# Patient Record
Sex: Female | Born: 1940 | Race: White | Hispanic: No | Marital: Married | Smoking: Current some day smoker
Health system: Southern US, Community
[De-identification: ages and names within clinical notes are randomized; demographics above are authoritative.]

## PROBLEM LIST (undated history)

## (undated) DIAGNOSIS — I499 Cardiac arrhythmia, unspecified: Secondary | ICD-10-CM

## (undated) DIAGNOSIS — C884 Extranodal marginal zone b-cell lymphoma of mucosa-associated lymphoid tissue (malt-lymphoma) not having achieved remission: Secondary | ICD-10-CM

## (undated) DIAGNOSIS — E785 Hyperlipidemia, unspecified: Secondary | ICD-10-CM

## (undated) DIAGNOSIS — C801 Malignant (primary) neoplasm, unspecified: Secondary | ICD-10-CM

## (undated) DIAGNOSIS — J329 Chronic sinusitis, unspecified: Secondary | ICD-10-CM

## (undated) DIAGNOSIS — K219 Gastro-esophageal reflux disease without esophagitis: Secondary | ICD-10-CM

## (undated) DIAGNOSIS — E119 Type 2 diabetes mellitus without complications: Secondary | ICD-10-CM

## (undated) DIAGNOSIS — R002 Palpitations: Secondary | ICD-10-CM

## (undated) DIAGNOSIS — R1310 Dysphagia, unspecified: Secondary | ICD-10-CM

## (undated) DIAGNOSIS — F329 Major depressive disorder, single episode, unspecified: Secondary | ICD-10-CM

## (undated) DIAGNOSIS — C3430 Malignant neoplasm of lower lobe, unspecified bronchus or lung: Secondary | ICD-10-CM

## (undated) DIAGNOSIS — N3010 Interstitial cystitis (chronic) without hematuria: Secondary | ICD-10-CM

## (undated) DIAGNOSIS — K449 Diaphragmatic hernia without obstruction or gangrene: Secondary | ICD-10-CM

## (undated) DIAGNOSIS — F32A Depression, unspecified: Secondary | ICD-10-CM

## (undated) HISTORY — DX: Depression, unspecified: F32.A

## (undated) HISTORY — PX: EYE SURGERY: SHX253

## (undated) HISTORY — DX: Interstitial cystitis (chronic) without hematuria: N30.10

## (undated) HISTORY — DX: Diaphragmatic hernia without obstruction or gangrene: K44.9

## (undated) HISTORY — PX: ABDOMINAL HYSTERECTOMY: SHX81

## (undated) HISTORY — PX: ESOPHAGOGASTRODUODENOSCOPY: SHX1529

## (undated) HISTORY — PX: COLONOSCOPY: SHX174

## (undated) HISTORY — DX: Palpitations: R00.2

## (undated) HISTORY — DX: Extranodal marginal zone b-cell lymphoma of mucosa-associated lymphoid tissue (malt-lymphoma) not having achieved remission: C88.40

## (undated) HISTORY — PX: BLADDER SURGERY: SHX569

## (undated) HISTORY — PX: CATARACT EXTRACTION, BILATERAL: SHX1313

## (undated) HISTORY — DX: Dysphagia, unspecified: R13.10

## (undated) HISTORY — DX: Extranodal marginal zone B-cell lymphoma of mucosa-associated lymphoid tissue (MALT-lymphoma): C88.4

## (undated) HISTORY — DX: Hyperlipidemia, unspecified: E78.5

## (undated) HISTORY — DX: Malignant neoplasm of lower lobe, unspecified bronchus or lung: C34.30

## (undated) HISTORY — DX: Major depressive disorder, single episode, unspecified: F32.9

## (undated) HISTORY — DX: Type 2 diabetes mellitus without complications: E11.9

---

## 2000-01-20 ENCOUNTER — Encounter: Payer: Self-pay | Admitting: Urology

## 2000-01-21 ENCOUNTER — Observation Stay (HOSPITAL_COMMUNITY): Admission: RE | Admit: 2000-01-21 | Discharge: 2000-01-22 | Payer: Self-pay | Admitting: Urology

## 2000-07-28 ENCOUNTER — Ambulatory Visit (HOSPITAL_COMMUNITY): Admission: RE | Admit: 2000-07-28 | Discharge: 2000-07-28 | Payer: Self-pay | Admitting: Urology

## 2003-10-30 ENCOUNTER — Ambulatory Visit (HOSPITAL_COMMUNITY): Admission: RE | Admit: 2003-10-30 | Discharge: 2003-10-30 | Payer: Self-pay | Admitting: Internal Medicine

## 2010-09-02 DIAGNOSIS — R1310 Dysphagia, unspecified: Secondary | ICD-10-CM | POA: Insufficient documentation

## 2010-09-02 DIAGNOSIS — E7800 Pure hypercholesterolemia, unspecified: Secondary | ICD-10-CM | POA: Insufficient documentation

## 2010-09-02 DIAGNOSIS — F329 Major depressive disorder, single episode, unspecified: Secondary | ICD-10-CM

## 2010-09-02 DIAGNOSIS — R000 Tachycardia, unspecified: Secondary | ICD-10-CM | POA: Insufficient documentation

## 2010-09-08 ENCOUNTER — Ambulatory Visit: Payer: Self-pay | Admitting: Internal Medicine

## 2010-09-15 ENCOUNTER — Ambulatory Visit: Payer: Self-pay | Admitting: Internal Medicine

## 2010-09-15 ENCOUNTER — Ambulatory Visit (HOSPITAL_COMMUNITY): Admission: RE | Admit: 2010-09-15 | Discharge: 2010-09-15 | Payer: Self-pay | Admitting: Internal Medicine

## 2010-10-14 ENCOUNTER — Telehealth (INDEPENDENT_AMBULATORY_CARE_PROVIDER_SITE_OTHER): Payer: Self-pay

## 2010-12-01 NOTE — Letter (Signed)
Summary: EGD/ED ORDER  EGD/ED ORDER   Imported By: Ave Filter 09/08/2010 15:55:31  _____________________________________________________________________  External Attachment:    Type:   Image     Comment:   External Document

## 2010-12-03 NOTE — Progress Notes (Signed)
Summary: pt requests 90 day suppply of the Lansoprazole  Phone Note Call from Patient   Caller: Patient Summary of Call: Pt said she was given a prescription by Dr. Jena Gauss the day of her procedure for Lansoprazole. She said that her insurance will cover a 90 day supply if we can call it in to the [facility] in [place].  Initial call taken by: Cloria Spring LPN,  October 14, 2010 10:58 AM     Appended Document: pt requests 90 day suppply of the Lansoprazole ok; 90 day supply  Appended Document: pt requests 90 day suppply of the Lansoprazole Informed [facility] at [facility]. Pt aware also.

## 2010-12-03 NOTE — Assessment & Plan Note (Signed)
Summary: PT GETS CHOKED WHEN SHE EATS/LAW   Primary Care Provider:  T[facility]  Chief Complaint:  difficulty swallowing.  History of Present Illness: 70 y/o caucasian female w/ recurrent dysphagia w/ solids and occ liquids almost evey time she eats.  Wt loss 20# in 3 months but she feels somewhat intentional by dieting.  Denies odynophagia.  Off nexium x 4-61mo because she felt it wasn't helping too much.  Stays away from certain foods like onions & cucumbers.  Takes as needed TUMS.  Occ nausea & regurgitation.  Bms ok, once daily, without melena or rectal bleeding.    Current Problems (verified): 1)  Depression, Mild  (ICD-311) 2)  Hypercholesterolemia  (ICD-272.0) 3)  Tachycardia  (ICD-785.0) 4)  Dysphagia Unspecified  (ICD-787.20)  Current Medications (verified): 1)  Lopressor 50 Mg Tabs (Metoprolol Tartrate) 2)  Aspir-Low 81 Mg Tbec (Aspirin) .... Take 1 Tablet By Mouth Once A Day 3)  Multi-Vitamin .... One Tablet Daily 4)  Fish Oil 1000mg  .... One Tablet Daily 5)  Calcium 600mg  .... Take 1 Tablet By Mouth Two Times A Day 6)  Tums .... As Needed 7)  Metformin Hcl 500 Mg Xr24h-Tab (Metformin Hcl) .... One Tablet in The Am 8)  Citalopram Hydrobromide 20 Mg Tabs (Citalopram Hydrobromide) .... One Tablet Daily 9)  Simvastatin 20 Mg Tabs (Simvastatin) .... One Tablet At Bedtime 10)  Macrodantin 100 Mg Caps (Nitrofurantoin Macrocrystal) .... One Tablet Daily  Allergies (verified): 1)  ! Sulfa 2)  ! Sulfa  Past History:  Past Medical History: colonoscopy Dr Cleotis Nipper 2009->normal EGD w/ dilation for Schatski's ring 2004, 1997 hiatal hernia DM hyperlipidmia interstitial cystitis (?) Dr Annabell Howells depression heart palpitations  Past Surgical History: complete hysterectomy bladder surgery x 2  Family History: lost 1 son w/ metastatic CA ? source (65) 1 son deceased age 36 suicide No known family history of colorectal carcinoma, IBD, liver or chronic GI problems.  Social  History: married 2 living children, lost 1 son suicide, another met Ca retired [employer]  Patient is a former smoker. quit age 54, 50pkyr Alcohol Use - no Illicit Drug Use - no Patient does not get regular exercise.  Smoking Status:  quit Does Patient Exercise:  no Drug Use:  no  Review of Systems General:  Denies fever, chills, sweats, anorexia, fatigue, weakness, malaise, and sleep disorder. CV:  Denies chest pains, angina, palpitations, syncope, dyspnea on exertion, orthopnea, PND, peripheral edema, and claudication. GI:  Denies jaundice, gas/bloating, and fecal incontinence. GU:  Denies urinary burning, blood in urine, nocturnal urination, urinary frequency, urinary incontinence, and abnormal vaginal bleeding. MS:  Complains of joint pain / LOM; denies joint swelling, joint stiffness, joint deformity, low back pain, muscle weakness, muscle cramps, muscle atrophy, leg pain at night, leg pain with exertion, and shoulder pain / LOM hand / wrist pain (CTS); knees. Derm:  Denies rash, itching, dry skin, hives, moles, warts, and unhealing ulcers. Psych:  Denies depression, anxiety, memory loss, suicidal ideation, hallucinations, paranoia, phobia, and confusion. Heme:  Denies bruising, bleeding, and enlarged lymph nodes.  Vital Signs:  Patient profile:   70 year old female Height:      67 inches Weight:      164 pounds BMI:     25.78 Temp:     98.6 degrees F oral Pulse rate:   60 / minute BP sitting:   120 / 80  (left arm) Cuff size:   large  Vitals Entered By: Cloria Spring LPN (September 08, 2010  1:50 PM)  Physical Exam  General:  Well developed, well nourished, no acute distress. Head:  Normocephalic and atraumatic. Eyes:  Sclera clear, no icterus. Ears:  Normal auditory acuity. Nose:  No deformity, discharge,  or lesions. Mouth:  No deformity or lesions, dentition normal. Neck:  Supple; no masses or thyromegaly. Lungs:  Clear throughout to auscultation. Heart:  Regular rate and  rhythm; no murmurs, rubs,  or bruits. Abdomen:  Soft, nontender and nondistended. No masses, hepatosplenomegaly or hernias noted. Normal bowel sounds.without guarding and without rebound.   Msk:  Symmetrical with no gross deformities. Normal posture. Pulses:  Normal pulses noted. Extremities:  No clubbing, cyanosis, edema or deformities noted. Neurologic:  Alert and  oriented x4;  grossly normal neurologically. Skin:  Intact without significant lesions or rashes. Cervical Nodes:  No significant cervical adenopathy. Psych:  Alert and cooperative. Normal mood and affect.  Impression & Recommendations:  Problem # 1:  DYSPHAGIA UNSPECIFIED (ICD-29.20) 70 y/o caucasian female here for 1 yr hx worsening esophageal dysphagia with hx Schatzki rings.  I suspect recurrent ring(s).  EGD with esophageal dilatation to be performed by Dr. Jonathon Bellows in the near future.  I have discussed risks and benefits which include, but are not limited to, bleeding, infection, perforation, or medication reaction.  The patient agrees with this plan and consent will be obtained.  Orders: Consultation Level III (16109)  Patient Instructions: 1)  Begin prevacid 30mg  daily for acid reflux (2 weeks samples given)

## 2011-01-12 LAB — H. PYLORI ANTIBODY, IGG: H Pylori IgG: <0.4 {ISR}

## 2011-01-12 LAB — GLUCOSE, CAPILLARY: Glucose-Capillary: 124 mg/dL — ABNORMAL HIGH (ref 70–99)

## 2011-03-19 NOTE — Op Note (Signed)
NAME:  Monica [name], Monica [name]                          ACCOUNT NO.:  [id number]   MEDICAL RECORD NO.:  [id number]                   PATIENT TYPE:  AMB   LOCATION:  DAY                                  FACILITY:  [facility]   PHYSICIAN:  R. Roetta Sessions, M.D.              DATE OF BIRTH:  Jan 23, 1941   DATE OF PROCEDURE:  10/30/2003  DATE OF DISCHARGE:                                 OPERATIVE REPORT   PROCEDURE:  Esophagogastroduodenoscopy with [name] dilation.   ENDOSCOPIST:  Gerrit Friends. Rourk, M.D.   INDICATIONS FOR PROCEDURE:  The patient is a 70 year old Caucasian female  with recurrent esophageal dysphagia. I saw this lady some 6 years ago at  [facility].  She underwent EGD for dysphagia. She was found  to have 2 distal Schatzki rings (in tandem orientation).  She was dilated up  to a #58 [place] dilator.  She has done well until recently.  She was  on Prilosec, but came off of this medication.  She is really not having much  in the way of any reflux symptoms.  She started having insidious recurrent  esophageal dysphagia of the past 1 year.  EGD is now being done.  This  approach has been discussed with the patient at length.  The potential  risks, benefits, and alternatives have been reviewed.  The potential for  esophageal dilation was reviewed.  Her questions were answered; she is  agreeable.  Please see my handwritten H&P.   PROCEDURE NOTE:  O2 saturation, blood pressure, pulse and respirations were  monitored throughout the entire procedure.  Conscious sedation: Versed 5 mg  IV, Demerol 100mg  IV in divided doses.   INSTRUMENT:  Olympus video chip adult gastroscope.   FINDINGS:  Examination of the tubular esophagus again revealed 2 distal,  tandem Schatzki rings.  The esophageal mucosa otherwise appeared normal.  The EG junction was easily traversed with the scope.   STOMACH:  The gastric cavity was empty.  It insufflated well with air.  A  thorough examination  of the gastric mucosa including a retroflex view of the  proximal stomach and esophagogastric junction demonstrated only a moderate  size hiatal hernia; and both rings could be seen well retroflexed as well.  The pylorus was patent and easily traversed.   DUODENUM:  The bulb and the second portion appeared normal.   THERAPEUTIC/DIAGNOSTIC MANEUVERS:  A 56 French Maloney dilator was passed to  full insertion with ease.  Subsequently a 39 French Maloney dilator was also  passed with ease.  A look back revealed both rings had been ruptured without  apparent complications.   The patient tolerated the procedure well and was reacted in endoscopy.   IMPRESSION:  1. Two distal Schatzki rings, status post dilation as described above;     otherwise normal esophagus.  2. Moderate-to-large size hiatal hernia; otherwise normal stomach.  3. Normal D1 and D2.  DISCUSSION:  Hopefully today's procedure will give [name] relief of her  dysphagia for years to come.  I note that she has never had a colonoscopy.  I have talked to her about having a screening colonoscopy and have urged her  to call my office to schedule one in 2005.      ___________________________________________                                            Jonathon Bellows, M.D.   RMR/MEDQ  D:  10/30/2003  T:  10/30/2003  Job:  [id number]

## 2011-03-19 NOTE — Op Note (Signed)
[facility]  Patient:    Monica [name], Monica [name]                       MRN: [id number] Proc. Date: 07/28/00 Adm. Date:  [id number] Attending:  Evlyn Clines                           Operative Report  PROCEDURE:  Durasphere implant.  PREOPERATIVE DIAGNOSES:  Stress incontinence.  POSTOPERATIVE DIAGNOSES:  Stress incontinence.  SURGEON:  Dr. Bjorn Pippin.  ANESTHESIA:  Local and IV sedation.  COMPLICATIONS:  None.  INDICATIONS FOR PROCEDURE:  Ms. Robidoux is a 70 year old white female who is approximately a year out from a pubovaginal sling. She was dry for the first 4-5 months and then began to have progressive incontinence. Recent urodynamics revealed good urethral support but low leak point pressures consistent with type 3 incontinence it was felt durasphere injection would be a worthwhile option.  DESCRIPTION OF PROCEDURE:  The patient had been given p.o. antibiotics. She was taken to the operating room where she was placed in lithotomy position. She was given sedation as needed. Her genitalia was prepped with Betadine solution and she was draped in the usual sterile fashion. Her urethra was instilled with 10 cc of 2% lidocaine jelly. After an appropriate period of time, a 1 French continuous flow resectoscope sheath was inserted. The urethra was calibrated to a 28 [place] prior to insertion of the scope. Wit the scope in position, it was fitted with the injection handle with a durasphere needle. The proximal urethral submucosa was infiltrated with 3 cc of 1% lidocaine without epinephrine on each side. I then injected 4.5 syringes of durasphere. Her tissue were somewhat blanced and scarified and did not accept the material well. Had a few small blebs primarily at the 4-5 oclock position and 1 full syringe bleb at the 11 oclock position. At this point, I felt it was not worth while or possible to effectively inject anymore material so I terminated the  procedure. Coughing in the supine position, she still leaked a bit not as much as she had preoperatively; however, her bladder was quite full at this point. The bladder was drained with a small red rubber catheter. She was taken down from lithotomy position and moved to the recovery room in stable condition. There were no complications during the procedure; however, she may need a second treatment. She will follow-up in 2 weeks for a recheck. D:  07/28/00 TD:  07/28/00 Job: [id number]

## 2011-07-30 ENCOUNTER — Encounter: Payer: Self-pay | Admitting: Internal Medicine

## 2013-06-22 ENCOUNTER — Ambulatory Visit (INDEPENDENT_AMBULATORY_CARE_PROVIDER_SITE_OTHER): Payer: Medicare Other | Admitting: Urology

## 2013-06-22 DIAGNOSIS — N3020 Other chronic cystitis without hematuria: Secondary | ICD-10-CM

## 2013-06-22 DIAGNOSIS — N393 Stress incontinence (female) (male): Secondary | ICD-10-CM

## 2013-06-22 DIAGNOSIS — N952 Postmenopausal atrophic vaginitis: Secondary | ICD-10-CM

## 2013-11-01 HISTORY — PX: CATARACT EXTRACTION W/ INTRAOCULAR LENS  IMPLANT, BILATERAL: SHX1307

## 2015-02-17 ENCOUNTER — Ambulatory Visit (INDEPENDENT_AMBULATORY_CARE_PROVIDER_SITE_OTHER): Payer: Medicare Other | Admitting: Nurse Practitioner

## 2015-02-17 ENCOUNTER — Other Ambulatory Visit: Payer: Self-pay

## 2015-02-17 ENCOUNTER — Encounter: Payer: Self-pay | Admitting: Nurse Practitioner

## 2015-02-17 VITALS — BP 123/66 | HR 79 | Temp 97.4°F | Ht 68.0 in | Wt 162.6 lb

## 2015-02-17 DIAGNOSIS — R1310 Dysphagia, unspecified: Secondary | ICD-10-CM

## 2015-02-17 DIAGNOSIS — R1314 Dysphagia, pharyngoesophageal phase: Secondary | ICD-10-CM

## 2015-02-17 NOTE — Assessment & Plan Note (Signed)
74 year old female presents for recurrent dysphagia. Last seen in our office in 2011 for similar symptoms and underwent an EGD with esophageal dilation which found Schatzki's ring/component of peptic ulcer stricture which was dilated, otherwise normal esophagus. Has had 3 esophageal dilations so far and states that each one tends to work substantially well without recurrence of symptoms for multiple years. Began having recurrent symptoms 6 months ago including dysphagia and regurgitation. Has had 3 episodes in the past month having excuse herself from the table at a restaurant in order to go the bathroom and vomit food that has become lodged in her esophagus. Denies hematemesis, abdominal pain, fever, chills, chest pain, dyspnea, any other red flag/warning signs or symptoms. It is point we'll proceed with a repeat endoscopy with possible dilation to further evaluate and treat her symptoms.  Proceed with EGD +/- dilation with Dr. Gala Romney in near future: the risks, benefits, and alternatives have been discussed with the patient in detail. The patient states understanding and desires to proceed.  Is on 81 mg baby aspirin. Is on metformin. Is on Zoloft. No other anticoagulants, diabetes medications, antidepressants, her chronic pain medications. Has had procedures before with conscious sedation without incident. No need for propofol. We'll advise her to take half of her metformin dose and I before, and then the morning of.

## 2015-02-17 NOTE — Progress Notes (Signed)
Primary Care Physician:  [name], MD Primary Gastroenterologist:  Dr. Gala Romney  Chief Complaint  Patient presents with  . Dysphagia    HPI:   74 year old female presents for evaluation of dysphagia symptoms. Last seen in November 2011 for similar symptoms at which point an endoscopy was performed. 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 PCP records reviewed.  Today she states her symptoms returned about 6 months ago. Has had 3 episodes in the past month where at a restaurant and she had to excuse herself to the restroom to vomit. Has had similar episodes of regurgitation at home. Has solid food and this liquid dysphagia. Denies pill dysphagia. Denies other N/V, abdominal pain, fever, chills, unintentional weight loss, chest pain, dyspnea. Has had 3 esophageal dilations and has had no problems with conscious sedation. Denies GERD symptoms. Denies any oter upper or lower GI symptoms.   Past Medical History  Diagnosis Date  . Hiatal hernia   . DM (diabetes mellitus)   . Hyperlipidemia   . Interstitial cystitis   . Depression   . Heart palpitations   . Dysphagia     Past Surgical History  Procedure Laterality Date  . Esophagogastroduodenoscopy      RMR: Prominant Schzgzki ring/component of peptic stricture status post dilation and disruption as described above, otherwise norma esophagus, moderate-sized hiatal hernia, antal pyloric channel, and posterier bulbar erosions, otherwise unremarkable stomach, D1 and D2 . Inflammatory findings on the stomach and duodenum will likely be related to aspirin effect. We need to rule out Helicobacter pylor  . Colonoscopy      Dr. Lindalou Hose 2009: Normal per PCP notes    Current Outpatient Prescriptions  Medication Sig Dispense  Refill  . aspirin 81 MG tablet Take 81 mg by mouth daily.    . Calcium-Magnesium-Vitamin D (CALCIUM 500 PO) Take 500 mg by mouth daily.    . citalopram (CELEXA) 20 MG tablet Take 20 mg by mouth daily.     Marland Kitchen co-enzyme Q-10 30 MG capsule Take 30 mg by mouth 3 (three) times daily.    . metFORMIN (GLUCOPHAGE-XR) 500 MG 24 hr tablet 500 mg daily with breakfast.     . metoprolol tartrate (LOPRESSOR) 25 MG tablet Take 25 mg by mouth once.     . Multiple Vitamin (MULTIVITAMIN) capsule Take 1 capsule by mouth daily.    . simvastatin (ZOCOR) 20 MG tablet Take 20 mg by mouth daily.     No current facility-administered medications for this visit.    Allergies as of 02/17/2015 - reviewed 09/08/2010  Allergen Reaction Noted  . Sulfonamide derivatives      Family History  Problem Relation Age of Onset  . Colon cancer Neg Hx     History   Social History  . Marital Status: Married    Spouse Name: N/A  . Number of Children: N/A  . Years of Education: N/A   Occupational History  . Not on file.   Social History Main Topics  . Smoking status: Current Every Day Smoker -- 0.50 packs/day    Types: Cigarettes  . Smokeless tobacco: Not on file  . Alcohol Use: No  . Drug Use: No  . Sexual Activity: Not on file   Other Topics Concern  . Not on file   Social History Narrative    Review of Systems:  General: Negative for anorexia, weight loss, fever, chills, fatigue, weakness. Eyes: Negative for vision changes.  ENT: Negative for nasal congestion. CV: Negative for chest pain, angina, palpitations, peripheral edema.  Respiratory: Negative for dyspnea at rest, cough, wheezing.  GI: See history of present illness. MS: Negative for joint pain, low back pain.  Derm: Negative for rash or itching.  Neuro: Negative for weakness, seizure, memory loss, confusion.  Psych: Negative for anxiety, depression.  Endo: Negative for unusual weight change.  Heme: Negative for bruising or bleeding. Allergy:  Negative for rash or hives.    Physical Exam: BP 123/66 mmHg  Pulse 79  Temp(Src) 97.4 F (36.3 C) (Oral)  Ht 5\' 8"  (1.727 m)  Wt 162 lb 9.6 oz (73.755 kg)  BMI 24.73 kg/m2 General:   Alert and oriented. Pleasant and cooperative. Well-nourished and well-developed.  Head:  Normocephalic and atraumatic. Eyes:  Without icterus, sclera clear and conjunctiva pink.  Ears:  Normal auditory acuity. Mouth:  No deformity or lesions, oral mucosa pink. No OP edema. Neck:  Supple, without mass or thyromegaly. Lungs:  Clear to auscultation bilaterally. No wheezes, rales, or rhonchi. No distress.  Heart:  S1, S2 present without murmurs appreciated.  Abdomen:  +BS, soft, non-tender and non-distended. No HSM noted. No guarding or rebound. No masses appreciated.  Rectal:  Deferred  Msk:  Symmetrical without gross deformities. Normal posture. Extremities:  Without clubbing or edema. Neurologic:  Alert and  oriented x4;  grossly normal neurologically. Skin:  Intact without significant lesions or rashes. Psych:  Alert and cooperative. Normal mood and affect.     02/20/2015 4:58 PM

## 2015-02-17 NOTE — Patient Instructions (Signed)
1. We will schedule your procedure (endoscopy with possible dilation) for you 2. Further recommendations to be based on results are procedure.

## 2015-02-20 ENCOUNTER — Encounter: Payer: Self-pay | Admitting: Nurse Practitioner

## 2015-02-21 NOTE — Progress Notes (Signed)
cc'ed to pcp °

## 2015-03-10 ENCOUNTER — Ambulatory Visit (HOSPITAL_COMMUNITY)
Admission: RE | Admit: 2015-03-10 | Discharge: 2015-03-10 | Disposition: A | Discharge status: Home / Self Care | Payer: Medicare Other | Source: Ambulatory Visit | Attending: Internal Medicine | Admitting: Internal Medicine

## 2015-03-10 ENCOUNTER — Encounter (HOSPITAL_COMMUNITY): Payer: Self-pay | Admitting: *Deleted

## 2015-03-10 ENCOUNTER — Encounter (HOSPITAL_COMMUNITY)
Admission: RE | Disposition: A | Discharge status: Home / Self Care | Payer: Self-pay | Source: Ambulatory Visit | Attending: Internal Medicine

## 2015-03-10 DIAGNOSIS — E785 Hyperlipidemia, unspecified: Secondary | ICD-10-CM | POA: Insufficient documentation

## 2015-03-10 DIAGNOSIS — F329 Major depressive disorder, single episode, unspecified: Secondary | ICD-10-CM | POA: Diagnosis not present

## 2015-03-10 DIAGNOSIS — Z882 Allergy status to sulfonamides status: Secondary | ICD-10-CM | POA: Insufficient documentation

## 2015-03-10 DIAGNOSIS — R1310 Dysphagia, unspecified: Secondary | ICD-10-CM | POA: Diagnosis present

## 2015-03-10 DIAGNOSIS — Q394 Esophageal web: Secondary | ICD-10-CM | POA: Diagnosis not present

## 2015-03-10 DIAGNOSIS — K3189 Other diseases of stomach and duodenum: Secondary | ICD-10-CM | POA: Diagnosis not present

## 2015-03-10 DIAGNOSIS — K449 Diaphragmatic hernia without obstruction or gangrene: Secondary | ICD-10-CM | POA: Diagnosis not present

## 2015-03-10 DIAGNOSIS — F17210 Nicotine dependence, cigarettes, uncomplicated: Secondary | ICD-10-CM | POA: Diagnosis not present

## 2015-03-10 DIAGNOSIS — K222 Esophageal obstruction: Secondary | ICD-10-CM | POA: Insufficient documentation

## 2015-03-10 DIAGNOSIS — E119 Type 2 diabetes mellitus without complications: Secondary | ICD-10-CM | POA: Insufficient documentation

## 2015-03-10 DIAGNOSIS — R1314 Dysphagia, pharyngoesophageal phase: Secondary | ICD-10-CM

## 2015-03-10 DIAGNOSIS — Z7982 Long term (current) use of aspirin: Secondary | ICD-10-CM | POA: Diagnosis not present

## 2015-03-10 HISTORY — PX: MALONEY DILATION: SHX5535

## 2015-03-10 HISTORY — PX: ESOPHAGOGASTRODUODENOSCOPY: SHX5428

## 2015-03-10 HISTORY — DX: Cardiac arrhythmia, unspecified: I49.9

## 2015-03-10 LAB — GLUCOSE, CAPILLARY: GLUCOSE-CAPILLARY: 119 mg/dL — AB (ref 70–99)

## 2015-03-10 SURGERY — EGD (ESOPHAGOGASTRODUODENOSCOPY)
Anesthesia: Moderate Sedation

## 2015-03-10 MED ORDER — LIDOCAINE VISCOUS 2 % MT SOLN
OROMUCOSAL | Status: DC | PRN
  Administered 2015-03-10: 20 mL via OROMUCOSAL

## 2015-03-10 MED ORDER — MIDAZOLAM HCL 5 MG/5ML IJ SOLN
INTRAMUSCULAR | Status: AC
  Filled 2015-03-10: qty 10

## 2015-03-10 MED ORDER — MIDAZOLAM HCL 5 MG/5ML IJ SOLN
INTRAMUSCULAR | Status: DC | PRN
  Administered 2015-03-10: 2 mg via INTRAVENOUS
  Administered 2015-03-10: 1 mg via INTRAVENOUS

## 2015-03-10 MED ORDER — MEPERIDINE HCL 100 MG/ML IJ SOLN
INTRAMUSCULAR | Status: AC
  Filled 2015-03-10: qty 2

## 2015-03-10 MED ORDER — SODIUM CHLORIDE 0.9 % IV SOLN
INTRAVENOUS | Status: DC
  Administered 2015-03-10: 07:00:00 via INTRAVENOUS

## 2015-03-10 MED ORDER — ONDANSETRON HCL 4 MG/2ML IJ SOLN
INTRAMUSCULAR | Status: DC | PRN
  Administered 2015-03-10: 4 mg via INTRAVENOUS

## 2015-03-10 MED ORDER — MEPERIDINE HCL 100 MG/ML IJ SOLN
INTRAMUSCULAR | Status: DC | PRN
  Administered 2015-03-10: 50 mg via INTRAVENOUS
  Administered 2015-03-10: 25 mg via INTRAVENOUS

## 2015-03-10 MED ORDER — ONDANSETRON HCL 4 MG/2ML IJ SOLN
INTRAMUSCULAR | Status: AC
  Filled 2015-03-10: qty 2

## 2015-03-10 MED ORDER — LIDOCAINE VISCOUS 2 % MT SOLN
OROMUCOSAL | Status: AC
  Filled 2015-03-10: qty 15

## 2015-03-10 NOTE — Interval H&P Note (Signed)
History and Physical Interval Note:  03/10/2015 7:37 AM  Monica [name]  has presented today for surgery, with the diagnosis of dysphagia  The various methods of treatment have been discussed with the patient and family. After consideration of risks, benefits and other options for treatment, the patient has consented to  Procedure(s) with comments: ESOPHAGOGASTRODUODENOSCOPY (EGD) (N/A) - 730am Monica [name] (N/A) as a surgical intervention .  The patient's history has been reviewed, patient examined, no change in status, stable for surgery.  I have reviewed the patient's chart and labs.  Questions were answered to the patient's satisfaction.     Manus Rudd   Attending note. No change. EGD with esophageal dilation as appropriate per plan. The risks, benefits, limitations, alternatives and imponderables have been reviewed with the patient. Potential for esophageal dilation, biopsy, etc. have also been reviewed.  Questions have been answered. All parties agreeable.

## 2015-03-10 NOTE — Discharge Instructions (Signed)
EGD Discharge instructions Please read the instructions outlined below and refer to this sheet in the next few weeks. These discharge instructions provide you with general information on caring for yourself after you leave the hospital. Your doctor may also give you specific instructions. While your treatment has been planned according to the most current medical practices available, unavoidable complications occasionally occur. If you have any problems or questions after discharge, please call your doctor. ACTIVITY  You may resume your regular activity but move at a slower pace for the next 24 hours.   Take frequent rest periods for the next 24 hours.   Walking will help expel (get rid of) the air and reduce the bloated feeling in your abdomen.   No driving for 24 hours (because of the anesthesia (medicine) used during the test).   You may shower.   Do not sign any important legal documents or operate any machinery for 24 hours (because of the anesthesia used during the test).  NUTRITION  Drink plenty of fluids.   You may resume your normal diet.   Begin with a light meal and progress to your normal diet.   Avoid alcoholic beverages for 24 hours or as instructed by your caregiver.  MEDICATIONS  You may resume your normal medications unless your caregiver tells you otherwise.  WHAT YOU CAN EXPECT TODAY  You may experience abdominal discomfort such as a feeling of fullness or gas pains.  FOLLOW-UP  Your doctor will discuss the results of your test with you.  SEEK IMMEDIATE MEDICAL ATTENTION IF ANY OF THE FOLLOWING OCCUR:  Excessive nausea (feeling sick to your stomach) and/or vomiting.   Severe abdominal pain and distention (swelling).   Trouble swallowing.   Temperature over 101 F (37.8 C).   Rectal bleeding or vomiting of blood.    GERD information provided  Hiatal hernia information provided  Begin Protonix 40 mg daily for GERD  Further recommendations to  follow pending review of pathology report  Office visit with [place] in 6 months    Gastroesophageal Reflux Disease, Adult Gastroesophageal reflux disease (GERD) happens when acid from your stomach flows up into the esophagus. When acid comes in contact with the esophagus, the acid causes soreness (inflammation) in the esophagus. Over time, GERD may create small holes (ulcers) in the lining of the esophagus. CAUSES   Increased body weight. This puts pressure on the stomach, making acid rise from the stomach into the esophagus.  Smoking. This increases acid production in the stomach.  Drinking alcohol. This causes decreased pressure in the lower esophageal sphincter (valve or ring of muscle between the esophagus and stomach), allowing acid from the stomach into the esophagus.  Late evening meals and a full stomach. This increases pressure and acid production in the stomach.  A malformed lower esophageal sphincter. Sometimes, no cause is found. SYMPTOMS   Burning pain in the lower part of the mid-chest behind the breastbone and in the mid-stomach area. This may occur twice a week or more often.  Trouble swallowing.  Sore throat.  Dry cough.  Asthma-like symptoms including chest tightness, shortness of breath, or wheezing. DIAGNOSIS  Your caregiver may be able to diagnose GERD based on your symptoms. In some cases, X-rays and other tests may be done to check for complications or to check the condition of your stomach and esophagus. TREATMENT  Your caregiver may recommend over-the-counter or prescription medicines to help decrease acid production. Ask your caregiver before starting or adding any new medicines.  HOME CARE INSTRUCTIONS   Change the factors that you can control. Ask your caregiver for guidance concerning weight loss, quitting smoking, and alcohol consumption.  Avoid foods and drinks that make your symptoms worse, such as:  Caffeine or alcoholic  drinks.  Chocolate.  Peppermint or mint flavorings.  Garlic and onions.  Spicy foods.  Citrus fruits, such as oranges, lemons, or limes.  Tomato-based foods such as sauce, chili, salsa, and pizza.  Fried and fatty foods.  Avoid lying down for the 3 hours prior to your bedtime or prior to taking a nap.  Eat small, frequent meals instead of large meals.  Wear loose-fitting clothing. Do not wear anything tight around your waist that causes pressure on your stomach.  Raise the head of your bed 6 to 8 inches with wood blocks to help you sleep. Extra pillows will not help.  Only take over-the-counter or prescription medicines for pain, discomfort, or fever as directed by your caregiver.  Do not take aspirin, ibuprofen, or other nonsteroidal anti-inflammatory drugs (NSAIDs). SEEK IMMEDIATE MEDICAL CARE IF:   You have pain in your arms, neck, jaw, teeth, or back.  Your pain increases or changes in intensity or duration.  You develop nausea, vomiting, or sweating (diaphoresis).  You develop shortness of breath, or you faint.  Your vomit is green, yellow, black, or looks like coffee grounds or blood.  Your stool is red, bloody, or black. These symptoms could be signs of other problems, such as heart disease, gastric bleeding, or esophageal bleeding. MAKE SURE YOU:   Understand these instructions.  Will watch your condition.  Will get help right away if you are not doing well or get worse. Document Released: 07/28/2005 Document Revised: 01/10/2012 Document Reviewed: 05/07/2011 [facility] Patient Information 2015 [place], [place]. This information is not intended to replace advice given to you by your health care provider. Make sure you discuss any questions you have with your health care provider.        Hiatal Hernia A hiatal hernia occurs when part of your stomach slides above the muscle that separates your abdomen from your chest (diaphragm). You can be born with a  hiatal hernia (congenital), or it may develop over time. In almost all cases of hiatal hernia, only the top part of the stomach pushes through.  Many people have a hiatal hernia with no symptoms. The larger the hernia, the more likely that you will have symptoms. In some cases, a hiatal hernia allows stomach acid to flow back into the tube that carries food from your mouth to your stomach (esophagus). This may cause heartburn symptoms. Severe heartburn symptoms may mean you have developed a condition called gastroesophageal reflux disease (GERD).  CAUSES  Hiatal hernias are caused by a weakness in the opening (hiatus) where your esophagus passes through your diaphragm to attach to the upper part of your stomach. You may be born with a weakness in your hiatus, or a weakness can develop. RISK FACTORS Older age is a major risk factor for a hiatal hernia. Anything that increases pressure on your diaphragm can also increase your risk of a hiatal hernia. This includes:  Pregnancy.  Excess weight.  Frequent constipation. SIGNS AND SYMPTOMS  People with a hiatal hernia often have no symptoms. If symptoms develop, they are almost always caused by GERD. They may include:  Heartburn.  Belching.  Indigestion.  Trouble swallowing.  Coughing or wheezing.  Sore throat.  Hoarseness.  Chest pain. DIAGNOSIS  A hiatal hernia is  sometimes found during an exam for another problem. Your health care provider may suspect a hiatal hernia if you have symptoms of GERD. Tests may be done to diagnose GERD. These may include:  X-rays of your stomach or chest.  An upper gastrointestinal (GI) series. This is an X-ray exam of your GI tract involving the use of a chalky liquid that you swallow. The liquid shows up clearly on the X-ray.  Endoscopy. This is a procedure to look into your stomach using a thin, flexible tube that has a tiny camera and light on the end of it. TREATMENT  If you have no symptoms, you  may not need treatment. If you have symptoms, treatment may include:  Dietary and lifestyle changes to help reduce GERD symptoms.  Medicines. These may include:  Over-the-counter antacids.  Medicines that make your stomach empty more quickly.  Medicines that block the production of stomach acid (H2 blockers).  Stronger medicines to reduce stomach acid (proton pump inhibitors).  You may need surgery to repair the hernia if other treatments are not helping. HOME CARE INSTRUCTIONS   Take all medicines as directed by your health care provider.  Quit smoking, if you smoke.  Try to achieve and maintain a healthy body weight.  Eat frequent small meals instead of three large meals a day. This keeps your stomach from getting too full.  Eat slowly.  Do not lie down right after eating.  Do noteat 1-2 hours before bed.   Do not drink beverages with caffeine. These include cola, coffee, cocoa, and tea.  Do not drink alcohol.  Avoid foods that can make symptoms of GERD worse. These may include:  Fatty foods.  Citrus fruits.  Other foods and drinks that contain acid.  Avoid putting pressure on your belly. Anything that puts pressure on your belly increases the amount of acid that may be pushed up into your esophagus.   Avoid bending over, especially after eating.  Raise the head of your bed by putting blocks under the legs. This keeps your head and esophagus higher than your stomach.  Do not wear tight clothing around your chest or stomach.  Try not to strain when having a bowel movement, when urinating, or when lifting heavy objects. SEEK MEDICAL CARE IF:  Your symptoms are not controlled with medicines or lifestyle changes.  You are having trouble swallowing.  You have coughing or wheezing that will not go away. SEEK IMMEDIATE MEDICAL CARE IF:  Your pain is getting worse.  Your pain spreads to your arms, neck, jaw, teeth, or back.  You have shortness of  breath.  You sweat for no reason.  You feel sick to your stomach (nauseous) or vomit.  You vomit blood.  You have bright red blood in your stools.  You have black, tarry stools.  Document Released: 01/08/2004 Document Revised: 03/04/2014 Document Reviewed: 10/05/2013 [facility] Patient Information 2015 [place], [place]. This information is not intended to replace advice given to you by your health care provider. Make sure you discuss any questions you have with your health care provider.

## 2015-03-10 NOTE — H&P (View-Only) (Signed)
  Primary Care Physician:  TAPPER,DAVID B, MD Primary Gastroenterologist:  Dr. Rourk  Chief Complaint  Patient presents with  . Dysphagia    HPI:   73-year-old female presents for evaluation of dysphagia symptoms. Last seen in November 2011 for similar symptoms at which point an endoscopy was performed. 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 PCP records reviewed.  Today she states her symptoms returned about 6 months ago. Has had 3 episodes in the past month where at a restaurant and she had to excuse herself to the restroom to vomit. Has had similar episodes of regurgitation at home. Has solid food and this liquid dysphagia. Denies pill dysphagia. Denies other N/V, abdominal pain, fever, chills, unintentional weight loss, chest pain, dyspnea. Has had 3 esophageal dilations and has had no problems with conscious sedation. Denies GERD symptoms. Denies any oter upper or lower GI symptoms.   Past Medical History  Diagnosis Date  . Hiatal hernia   . DM (diabetes mellitus)   . Hyperlipidemia   . Interstitial cystitis   . Depression   . Heart palpitations   . Dysphagia     Past Surgical History  Procedure Laterality Date  . Esophagogastroduodenoscopy      RMR: Prominant Schzgzki ring/component of peptic stricture status post dilation and disruption as described above, otherwise norma esophagus, moderate-sized hiatal hernia, antal pyloric channel, and posterier bulbar erosions, otherwise unremarkable stomach, D1 and D2 . Inflammatory findings on the stomach and duodenum will likely be related to aspirin effect. We need to rule out Helicobacter pylor  . Colonoscopy      Dr. Fleishman 2009: Normal per PCP notes    Current Outpatient Prescriptions  Medication Sig Dispense  Refill  . aspirin 81 MG tablet Take 81 mg by mouth daily.    . Calcium-Magnesium-Vitamin D (CALCIUM 500 PO) Take 500 mg by mouth daily.    . citalopram (CELEXA) 20 MG tablet Take 20 mg by mouth daily.     . co-enzyme Q-10 30 MG capsule Take 30 mg by mouth 3 (three) times daily.    . metFORMIN (GLUCOPHAGE-XR) 500 MG 24 hr tablet 500 mg daily with breakfast.     . metoprolol tartrate (LOPRESSOR) 25 MG tablet Take 25 mg by mouth once.     . Multiple Vitamin (MULTIVITAMIN) capsule Take 1 capsule by mouth daily.    . simvastatin (ZOCOR) 20 MG tablet Take 20 mg by mouth daily.     No current facility-administered medications for this visit.    Allergies as of 02/17/2015 - reviewed 09/08/2010  Allergen Reaction Noted  . Sulfonamide derivatives      Family History  Problem Relation Age of Onset  . Colon cancer Neg Hx     History   Social History  . Marital Status: Married    Spouse Name: N/A  . Number of Children: N/A  . Years of Education: N/A   Occupational History  . Not on file.   Social History Main Topics  . Smoking status: Current Every Day Smoker -- 0.50 packs/day    Types: Cigarettes  . Smokeless tobacco: Not on file  . Alcohol Use: No  . Drug Use: No  . Sexual Activity: Not on file   Other Topics Concern  . Not on file   Social History Narrative    Review of Systems:   General: Negative for anorexia, weight loss, fever, chills, fatigue, weakness. Eyes: Negative for vision changes.  ENT: Negative for nasal congestion. CV: Negative for chest pain, angina, palpitations, peripheral edema.  Respiratory: Negative for dyspnea at rest, cough, wheezing.  GI: See history of present illness. MS: Negative for joint pain, low back pain.  Derm: Negative for rash or itching.  Neuro: Negative for weakness, seizure, memory loss, confusion.  Psych: Negative for anxiety, depression.  Endo: Negative for unusual weight change.  Heme: Negative for bruising or bleeding. Allergy:  Negative for rash or hives.    Physical Exam: BP 123/66 mmHg  Pulse 79  Temp(Src) 97.4 F (36.3 C) (Oral)  Ht 5' 8" (1.727 m)  Wt 162 lb 9.6 oz (73.755 kg)  BMI 24.73 kg/m2 General:   Alert and oriented. Pleasant and cooperative. Well-nourished and well-developed.  Head:  Normocephalic and atraumatic. Eyes:  Without icterus, sclera clear and conjunctiva pink.  Ears:  Normal auditory acuity. Mouth:  No deformity or lesions, oral mucosa pink. No OP edema. Neck:  Supple, without mass or thyromegaly. Lungs:  Clear to auscultation bilaterally. No wheezes, rales, or rhonchi. No distress.  Heart:  S1, S2 present without murmurs appreciated.  Abdomen:  +BS, soft, non-tender and non-distended. No HSM noted. No guarding or rebound. No masses appreciated.  Rectal:  Deferred  Msk:  Symmetrical without gross deformities. Normal posture. Extremities:  Without clubbing or edema. Neurologic:  Alert and  oriented x4;  grossly normal neurologically. Skin:  Intact without significant lesions or rashes. Psych:  Alert and cooperative. Normal mood and affect.     02/20/2015 4:58 PM  

## 2015-03-10 NOTE — Op Note (Signed)
[facility] [address] [place], [address]   ENDOSCOPY PROCEDURE REPORT  PATIENT: Monica [name], Monica [name]  MR#: [id number] BIRTHDATE: Mar 11, 1941 , 73  yrs. old GENDER: female ENDOSCOPIST: R.  Garfield Cornea, MD FACP FACG REFERRED BY:  Matthias Hughs, M.D. PROCEDURE DATE:  03-15-2015 PROCEDURE:  EGD w/ biopsy and Maloney dilation of esophagus INDICATIONS:  Recurrent esophageal dysphagia; history Schatzki's ring. MEDICATIONS: Versed 4 mg IV and Demerol 75 mg IV in divided doses. Xylocaine gel orally.  Zofran 4 mg IV. ASA CLASS:      Class II  CONSENT: The risks, benefits, limitations, alternatives and imponderables have been discussed.  The potential for biopsy, esophogeal dilation, etc. have also been reviewed.  Questions have been answered.  All parties agreeable.  Please see the history and physical in the medical record for more information.  DESCRIPTION OF PROCEDURE: After the risks benefits and alternatives of the procedure were thoroughly explained, informed consent was obtained.  The EG-[id number] ([id number]) endoscope was introduced through the mouth and advanced to the    , limited by Without limitations. The instrument was slowly withdrawn as the mucosa was fully examined.    Prominent Schatzki's ring.  No esophagitis.  No Barrett's esophagus. No tumor.  Mild resistance upon passing the scope across the EG junction.  The patient had a 6-7 cm hiatal hernia with gastric erosions straddling the diaphragmatic hiatus.  No ulcer infiltrating process.  Patent pylorus.  Normal-appearing first and second portion of the duodenum.  Retroflexed views revealed a hiatal hernia.    Scope was withdrawn and a 54 [place] Maloney dilator was passed full insertion with mild to moderate resistance. A look back revealed the ring and been nicely ruptured without apparent complication. Finally, biopsies of the abnormal gastric mucosa taken for histologic study. The scope was then  withdrawn from the patient and the procedure completed.  COMPLICATIONS: There were no immediate complications.  ENDOSCOPIC IMPRESSION: Prominent Schatzki's ring?"dilated as described above. Gastric erosions?"likely representing Lysbeth Galas lesions?"biopsied. Large hiatal hernia.  RECOMMENDATIONS: Begin Protonix 40 mg daily. Follow-up on pathology. Office visit in 6 months.  REPEAT EXAM:  eSigned:  R. Garfield Cornea, MD Rosalita Chessman [facility] 2015/03/15 8:19 AM    CC:  CPT CODES: ICD CODES:  The ICD and CPT codes recommended by this software are interpretations from the data that the clinical staff has captured with the software.  The verification of the translation of this report to the ICD and CPT codes and modifiers is the sole responsibility of the health care institution and practicing physician where this report was generated.  [employer] will not be held responsible for the validity of the ICD and CPT codes included on this report.  AMA assumes no liability for data contained or not contained herein. CPT is a Designer, television/film set of the [employer].  PATIENT NAME:  Monica [name] MR#: [id number]

## 2015-03-11 ENCOUNTER — Encounter (HOSPITAL_COMMUNITY): Payer: Self-pay | Admitting: Internal Medicine

## 2015-03-13 ENCOUNTER — Telehealth: Payer: Self-pay | Admitting: Internal Medicine

## 2015-03-13 NOTE — Telephone Encounter (Signed)
Dr. Anselm Lis, the pathologist, called. Gastric mucosal biopsies inconclusive for MALT-oma. No H. pylori seen. No obvious neoplasm otherwise.  She should probably be seen back in the office in about 4-5 months from now. Best thing would be to repeat EGD and just takes more biopsies to make sure everything is okay.  In the interim, let to do serologies for H. pylori.

## 2015-03-17 ENCOUNTER — Other Ambulatory Visit: Payer: Self-pay

## 2015-03-17 DIAGNOSIS — R109 Unspecified abdominal pain: Secondary | ICD-10-CM

## 2015-03-17 NOTE — Telephone Encounter (Signed)
Pt is aware. She said she is already scheduled to come back for an ov. Lab order done. Informed pt that I would mail it to her and she would need to take it to a [facility] lab. [facility] no longer does hyplori serologies. Mailed to the pt.

## 2015-03-24 ENCOUNTER — Encounter: Payer: Self-pay | Admitting: Internal Medicine

## 2015-03-24 ENCOUNTER — Telehealth: Payer: Self-pay

## 2015-03-24 NOTE — Telephone Encounter (Signed)
Per RMR- Send letter to patient.  Send copy of letter with path to referring provider and PCP.   Patient needs an office visit in 6 months to set up for repeat EGD

## 2015-03-24 NOTE — Telephone Encounter (Signed)
Letter mailed to the pt. 

## 2015-04-07 NOTE — Telephone Encounter (Signed)
OV MADE °

## 2015-09-10 ENCOUNTER — Encounter: Payer: Self-pay | Admitting: Gastroenterology

## 2015-09-10 ENCOUNTER — Ambulatory Visit (INDEPENDENT_AMBULATORY_CARE_PROVIDER_SITE_OTHER): Payer: Medicare Other | Admitting: Gastroenterology

## 2015-09-10 ENCOUNTER — Other Ambulatory Visit: Payer: Self-pay

## 2015-09-10 VITALS — BP 144/70 | HR 80 | Temp 97.1°F | Ht 68.0 in | Wt 175.2 lb

## 2015-09-10 DIAGNOSIS — R1310 Dysphagia, unspecified: Secondary | ICD-10-CM | POA: Diagnosis not present

## 2015-09-10 MED ORDER — PANTOPRAZOLE SODIUM 40 MG PO TBEC
40.0000 mg | DELAYED_RELEASE_TABLET | Freq: Every day | ORAL | Status: DC
Start: 2015-09-10 — End: 2016-08-18

## 2015-09-10 NOTE — Patient Instructions (Signed)
I sent in the Protonix prescription to the mail order for a 90 day supply. Let me know if we need to change this.   We have scheduled you for an upper endoscopy with possible dilation with Dr. Gala Romney. You may need a special xray in the future to further evaluate your esophagus.

## 2015-09-10 NOTE — Progress Notes (Signed)
cc'ed to pcp °

## 2015-09-10 NOTE — Progress Notes (Signed)
Referring Provider: [name]., MD Primary Care Physician:  [name], MD  Primary GI: Dr. Gala Romney   Chief Complaint  Patient presents with  . Follow-up    HPI:   Monica [name] is a 74 y.o. 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 Pathology with lymphoid population of stomach, slight atypia. Returns today to set up repeat EGD for further biopsies. H.pylori serology negative.    Dysphagia much improved since dilation but feels like "not stretched enough". Has difficulty with breads mostly. Feels like it just stops in cervical region. No abdominal pain. Protonix is 10$ a month but thinks insurance is not covering this completely. Willing to try a 90 day prescription to see if costs remains the same.   No lower GI symptoms of concern.    Past Medical History  Diagnosis Date  . Hiatal hernia   . DM (diabetes mellitus) ([place])   . Hyperlipidemia   . Interstitial cystitis   . Depression   . Heart palpitations   . Dysphagia   . Dysrhythmia     History of palpatations    Past Surgical History  Procedure Laterality Date  . Esophagogastroduodenoscopy      RMR: Prominant Schzgzki ring/component of peptic stricture status post dilation and disruption as described above, otherwise norma esophagus, moderate-sized hiatal hernia, antal pyloric channel, and posterier bulbar erosions, otherwise unremarkable stomach, D1 and D2 . Inflammatory findings on the stomach and duodenum will likely be related to aspirin effect. We need to rule out Helicobacter pylor  . Colonoscopy      Dr. Lindalou Hose 2009: Normal per PCP notes  . Esophagogastroduodenoscopy N/A 03/10/2015    Dr. 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 Pathology with lymphoid population of stomach, slight atypia  .  Maloney dilation N/A 03/10/2015    Procedure: Venia Minks DILATION;  Surgeon: Daneil Dolin, MD;  Location: [facility];  Service: Endoscopy;  Laterality: N/A;    Current Outpatient Prescriptions  Medication Sig Dispense Refill  . aspirin 81 MG tablet Take 81 mg by mouth daily.    . Calcium-Magnesium-Vitamin D (CALCIUM 500 PO) Take 500 mg by mouth daily.    . citalopram (CELEXA) 20 MG tablet Take 20 mg by mouth daily.     Marland Kitchen co-enzyme Q-10 30 MG capsule Take 30 mg by mouth daily.     . metFORMIN (GLUCOPHAGE-XR) 500 MG 24 hr tablet 500 mg daily with breakfast.     . metoprolol tartrate (LOPRESSOR) 25 MG tablet Take 25 mg by mouth once.     . Multiple Vitamin (MULTIVITAMIN) capsule Take 1 capsule by mouth daily.    . pantoprazole (PROTONIX) 40 MG tablet Take 40 mg by mouth daily.    . simvastatin (ZOCOR) 20 MG tablet Take 20 mg by mouth daily.     No current facility-administered medications for this visit.    Allergies as of 09/10/2015 - Review Complete 09/10/2015  Allergen Reaction Noted  . Sulfonamide derivatives Hives     Family History  Problem Relation Age of Onset  . Colon cancer Neg Hx     Social History   Social History  . Marital Status: Married    Spouse Name: N/A  . Number of Children: N/A  . Years of Education: N/A   Social History Main Topics  . Smoking status:  Current Every Day Smoker -- 0.50 packs/day    Types: Cigarettes  . Smokeless tobacco: None  . Alcohol Use: No  . Drug Use: No  . Sexual Activity: Not Asked   Other Topics Concern  . None   Social History Narrative    Review of Systems: Gen: Denies fever, chills, anorexia. Denies fatigue, weakness, weight loss.  CV: Denies chest pain, palpitations, syncope, peripheral edema, and claudication. Resp: Denies dyspnea at rest, cough, wheezing, coughing up blood, and pleurisy. GI: see HPI  Derm: Denies rash, itching, dry skin Psych: Denies depression, anxiety, memory loss, confusion. No homicidal or  suicidal ideation.  Heme: Denies bruising, bleeding, and enlarged lymph nodes.  Physical Exam: BP 144/70 mmHg  Pulse 80  Temp(Src) 97.1 F (36.2 C)  Ht '5\' 8"'$  (1.727 m)  Wt 175 lb 3.2 oz (79.47 kg)  BMI 26.65 kg/m2 General:   Alert and oriented. No distress noted. Pleasant and cooperative.  Head:  Normocephalic and atraumatic. Eyes:  Conjuctiva clear without scleral icterus. Mouth:  Oral mucosa pink and moist. Good dentition. No lesions. Heart:  S1, S2 present without murmurs, rubs, or gallops. Regular rate and rhythm. Abdomen:  +BS, soft, non-tender and non-distended. No rebound or guarding. No HSM or masses noted. Msk:  Symmetrical without gross deformities. Normal posture. Extremities:  Without edema. Neurologic:  Alert and  oriented x4;  grossly normal neurologically. Psych:  Alert and cooperative. Normal mood and affect.

## 2015-09-10 NOTE — Assessment & Plan Note (Signed)
74 year old female in follow-up for history of Schatzki's ring s/p dilation in May 2016; however, gastric biopsies revealed lymphoid population of stomach with slight atypia. Recommending surveillance EGD now for further biopsies. As of note, H.pylori serology negative. Continues to have slight difficulty with bread. Query a motility disorder underlying.   Proceed with upper endoscopy with POSSIBLE dilation in the near future with Dr. Gala Romney. The risks, benefits, and alternatives have been discussed in detail with patient. They have stated understanding and desire to proceed.  Continue Protonix once daily Consider BPE after EGD

## 2015-10-07 ENCOUNTER — Telehealth: Payer: Self-pay

## 2015-10-07 NOTE — Telephone Encounter (Signed)
Noted  

## 2015-10-07 NOTE — Telephone Encounter (Signed)
Pt called to cancel her EGD because she has the stomach bug. She will call when she feels better to come back in for an office visit to get rescheduled.

## 2015-10-08 ENCOUNTER — Encounter (HOSPITAL_COMMUNITY): Admission: RE | Payer: Self-pay | Source: Ambulatory Visit

## 2015-10-08 ENCOUNTER — Ambulatory Visit (HOSPITAL_COMMUNITY): Admission: RE | Admit: 2015-10-08 | Payer: Medicare Other | Source: Ambulatory Visit | Admitting: Internal Medicine

## 2015-10-08 SURGERY — EGD (ESOPHAGOGASTRODUODENOSCOPY)
Anesthesia: Moderate Sedation

## 2015-11-05 DIAGNOSIS — M25422 Effusion, left elbow: Secondary | ICD-10-CM | POA: Diagnosis not present

## 2015-11-05 DIAGNOSIS — R05 Cough: Secondary | ICD-10-CM | POA: Diagnosis not present

## 2015-11-05 DIAGNOSIS — J320 Chronic maxillary sinusitis: Secondary | ICD-10-CM | POA: Diagnosis not present

## 2015-11-11 DIAGNOSIS — Z23 Encounter for immunization: Secondary | ICD-10-CM | POA: Diagnosis not present

## 2015-11-26 DIAGNOSIS — M25422 Effusion, left elbow: Secondary | ICD-10-CM | POA: Diagnosis not present

## 2015-11-26 DIAGNOSIS — E119 Type 2 diabetes mellitus without complications: Secondary | ICD-10-CM | POA: Diagnosis not present

## 2015-12-05 DIAGNOSIS — R300 Dysuria: Secondary | ICD-10-CM | POA: Diagnosis not present

## 2015-12-23 ENCOUNTER — Telehealth: Payer: Self-pay | Admitting: Internal Medicine

## 2015-12-23 NOTE — Telephone Encounter (Signed)
Pt has office visit on 01/02/16 @ 8:30

## 2015-12-23 NOTE — Telephone Encounter (Signed)
[place], SHE MISSED HER EGD IN December AND WANTS TO RESCHEDULE

## 2016-01-02 ENCOUNTER — Encounter: Payer: Self-pay | Admitting: Gastroenterology

## 2016-01-02 ENCOUNTER — Ambulatory Visit (INDEPENDENT_AMBULATORY_CARE_PROVIDER_SITE_OTHER): Payer: Medicare Other | Admitting: Gastroenterology

## 2016-01-02 ENCOUNTER — Other Ambulatory Visit: Payer: Self-pay

## 2016-01-02 VITALS — BP 122/61 | HR 81 | Temp 97.6°F | Ht 68.0 in | Wt 173.8 lb

## 2016-01-02 DIAGNOSIS — R1310 Dysphagia, unspecified: Secondary | ICD-10-CM

## 2016-01-02 NOTE — Assessment & Plan Note (Addendum)
75 year old female with history of Schatzki's ring s/p dilation in May 2016; however, gastric biopsies revealed lymphoid population of stomach with slight atypia. She needs surveillance EGD now for further biopsies. As of note, H.pylori serology negative. Continues to have slight difficulty with bread. Question a motility disorder underlying. May benefit from repeat dilation if appropriate at time of EGD.  Proceed with upper endoscopy in the near future with possible dilation by Dr. Gala Romney. The risks, benefits, and alternatives have been discussed in detail with patient. They have stated understanding and desire to proceed.  Continue Protonix once daily Consider BPE after EGD  Return in 3 months

## 2016-01-02 NOTE — Progress Notes (Signed)
Referring Provider: [name]., MD Primary Care Physician:  [name], MD Primary GI: Dr. Gala Romney   Chief Complaint  Patient presents with  . set up EGD    HPI:   Monica [name] is a 75 y.o. 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 Pathology with lymphoid population of stomach, slight atypia. Returns today to set up repeat EGD for further biopsies. H.pylori serology negative.   Dysphagia improved since dilation but still feels like "it didn't do as well as in the past". Has difficulty with bread, which is the worst. Tries to eat small portions and chew really well. No abdominal pain. No N/V. No lower GI symptoms.    Past Medical History  Diagnosis Date  . Hiatal hernia   . DM (diabetes mellitus) ([place])   . Hyperlipidemia   . Interstitial cystitis   . Depression   . Heart palpitations   . Dysphagia   . Dysrhythmia     History of palpatations    Past Surgical History  Procedure Laterality Date  . Esophagogastroduodenoscopy      RMR: Prominant Schzgzki ring/component of peptic stricture status post dilation and disruption as described above, otherwise norma esophagus, moderate-sized hiatal hernia, antal pyloric channel, and posterier bulbar erosions, otherwise unremarkable stomach, D1 and D2 . Inflammatory findings on the stomach and duodenum will likely be related to aspirin effect. We need to rule out Helicobacter pylor  . Colonoscopy      Dr. Lindalou Hose 2009: Normal per PCP notes  . Esophagogastroduodenoscopy N/A 03/10/2015    Dr. 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 Pathology with lymphoid population of stomach, slight atypia  . Maloney dilation N/A 03/10/2015    Procedure: Venia Minks DILATION;  Surgeon: Daneil Dolin, MD;  Location: [facility];  Service:  Endoscopy;  Laterality: N/A;    Current Outpatient Prescriptions  Medication Sig Dispense Refill  . aspirin 81 MG tablet Take 81 mg by mouth daily.    . Calcium-Magnesium-Vitamin D (CALCIUM 500 PO) Take 500 mg by mouth daily.    . citalopram (CELEXA) 20 MG tablet Take 20 mg by mouth daily.     . metFORMIN (GLUCOPHAGE-XR) 500 MG 24 hr tablet 500 mg daily with breakfast.     . metoprolol tartrate (LOPRESSOR) 25 MG tablet Take 25 mg by mouth once.     . Multiple Vitamin (MULTIVITAMIN) capsule Take 1 capsule by mouth daily.    . pantoprazole (PROTONIX) 40 MG tablet Take 1 tablet (40 mg total) by mouth daily. 90 tablet 3  . simvastatin (ZOCOR) 20 MG tablet Take 20 mg by mouth daily.    Marland Kitchen co-enzyme Q-10 30 MG capsule Take 30 mg by mouth daily. Reported on 01/02/2016     No current facility-administered medications for this visit.    Allergies as of 01/02/2016 - Review Complete 01/02/2016  Allergen Reaction Noted  . Sulfonamide derivatives Hives     Family History  Problem Relation Age of Onset  . Colon cancer Neg Hx     Social History   Social History  . Marital Status: Married    Spouse Name: N/A  . Number of Children: N/A  . Years of Education: N/A   Social History Main Topics  . Smoking status: Current Every Day Smoker -- 0.50 packs/day    Types: Cigarettes  .  Smokeless tobacco: None  . Alcohol Use: No  . Drug Use: No  . Sexual Activity: Not Asked   Other Topics Concern  . None   Social History Narrative    Review of Systems: Gen: Denies fever, chills, anorexia. Denies fatigue, weakness, weight loss.  CV: Denies chest pain, palpitations, syncope, peripheral edema, and claudication. Resp: Denies dyspnea at rest, cough, wheezing, coughing up blood, and pleurisy. GI: see HPI  Derm: Denies rash, itching, dry skin Psych: Denies depression, anxiety, memory loss, confusion. No homicidal or suicidal ideation.  Heme: Denies bruising, bleeding, and enlarged lymph  nodes.  Physical Exam: BP 122/61 mmHg  Pulse 81  Temp(Src) 97.6 F (36.4 C)  Ht '5\' 8"'$  (1.727 m)  Wt 173 lb 12.8 oz (78.835 kg)  BMI 26.43 kg/m2 General:   Alert and oriented. No distress noted. Pleasant and cooperative.  Head:  Normocephalic and atraumatic. Eyes:  Conjuctiva clear without scleral icterus. Mouth:  Oral mucosa pink and moist. Good dentition. No lesions. Heart:  S1, S2 present with questionable soft systolic murmur  Abdomen:  +BS, soft, non-tender and non-distended. No rebound or guarding. No HSM or masses noted. Msk:  Symmetrical without gross deformities. Normal posture. Extremities:  Without edema. Neurologic:  Alert and  oriented x4;  grossly normal neurologically. Psych:  Alert and cooperative. Normal mood and affect.

## 2016-01-02 NOTE — Patient Instructions (Signed)
We have scheduled you for an upper endoscopy with dilation with Dr. Gala Romney.   Continue the Protonix once daily, 30 minutes before breakfast.   Return in 3 months.

## 2016-01-05 NOTE — Progress Notes (Signed)
cc'ed to pcp °

## 2016-01-16 DIAGNOSIS — R05 Cough: Secondary | ICD-10-CM | POA: Diagnosis not present

## 2016-01-16 DIAGNOSIS — J0101 Acute recurrent maxillary sinusitis: Secondary | ICD-10-CM | POA: Diagnosis not present

## 2016-02-03 ENCOUNTER — Encounter (HOSPITAL_COMMUNITY)
Admission: RE | Disposition: A | Discharge status: Home / Self Care | Payer: Self-pay | Source: Ambulatory Visit | Attending: Internal Medicine

## 2016-02-03 ENCOUNTER — Ambulatory Visit (HOSPITAL_COMMUNITY)
Admission: RE | Admit: 2016-02-03 | Discharge: 2016-02-03 | Disposition: A | Discharge status: Home / Self Care | Payer: Medicare Other | Source: Ambulatory Visit | Attending: Internal Medicine | Admitting: Internal Medicine

## 2016-02-03 ENCOUNTER — Encounter (HOSPITAL_COMMUNITY): Payer: Self-pay | Admitting: *Deleted

## 2016-02-03 DIAGNOSIS — Z79899 Other long term (current) drug therapy: Secondary | ICD-10-CM | POA: Diagnosis not present

## 2016-02-03 DIAGNOSIS — K449 Diaphragmatic hernia without obstruction or gangrene: Secondary | ICD-10-CM | POA: Insufficient documentation

## 2016-02-03 DIAGNOSIS — Z7982 Long term (current) use of aspirin: Secondary | ICD-10-CM | POA: Insufficient documentation

## 2016-02-03 DIAGNOSIS — C884 Extranodal marginal zone B-cell lymphoma of mucosa-associated lymphoid tissue [MALT-lymphoma]: Secondary | ICD-10-CM | POA: Insufficient documentation

## 2016-02-03 DIAGNOSIS — K3189 Other diseases of stomach and duodenum: Secondary | ICD-10-CM | POA: Insufficient documentation

## 2016-02-03 DIAGNOSIS — F329 Major depressive disorder, single episode, unspecified: Secondary | ICD-10-CM | POA: Diagnosis not present

## 2016-02-03 DIAGNOSIS — E785 Hyperlipidemia, unspecified: Secondary | ICD-10-CM | POA: Diagnosis not present

## 2016-02-03 DIAGNOSIS — R1310 Dysphagia, unspecified: Secondary | ICD-10-CM

## 2016-02-03 DIAGNOSIS — K2950 Unspecified chronic gastritis without bleeding: Secondary | ICD-10-CM | POA: Insufficient documentation

## 2016-02-03 DIAGNOSIS — K259 Gastric ulcer, unspecified as acute or chronic, without hemorrhage or perforation: Secondary | ICD-10-CM | POA: Diagnosis not present

## 2016-02-03 DIAGNOSIS — F17210 Nicotine dependence, cigarettes, uncomplicated: Secondary | ICD-10-CM | POA: Diagnosis not present

## 2016-02-03 DIAGNOSIS — K222 Esophageal obstruction: Secondary | ICD-10-CM | POA: Diagnosis not present

## 2016-02-03 DIAGNOSIS — Z7984 Long term (current) use of oral hypoglycemic drugs: Secondary | ICD-10-CM | POA: Insufficient documentation

## 2016-02-03 DIAGNOSIS — E119 Type 2 diabetes mellitus without complications: Secondary | ICD-10-CM | POA: Insufficient documentation

## 2016-02-03 HISTORY — PX: MALONEY DILATION: SHX5535

## 2016-02-03 HISTORY — DX: Chronic sinusitis, unspecified: J32.9

## 2016-02-03 HISTORY — PX: ESOPHAGOGASTRODUODENOSCOPY: SHX5428

## 2016-02-03 LAB — GLUCOSE, CAPILLARY: GLUCOSE-CAPILLARY: 179 mg/dL — AB (ref 65–99)

## 2016-02-03 SURGERY — EGD (ESOPHAGOGASTRODUODENOSCOPY)
Anesthesia: Moderate Sedation

## 2016-02-03 MED ORDER — STERILE WATER FOR IRRIGATION IR SOLN
Status: DC | PRN
  Administered 2016-02-03: 08:00:00

## 2016-02-03 MED ORDER — LIDOCAINE VISCOUS 2 % MT SOLN
OROMUCOSAL | Status: AC
  Filled 2016-02-03: qty 15

## 2016-02-03 MED ORDER — ONDANSETRON HCL 4 MG/2ML IJ SOLN
INTRAMUSCULAR | Status: AC
  Filled 2016-02-03: qty 2

## 2016-02-03 MED ORDER — SODIUM CHLORIDE 0.9 % IV SOLN
INTRAVENOUS | Status: DC
  Administered 2016-02-03: 08:00:00 via INTRAVENOUS

## 2016-02-03 MED ORDER — MEPERIDINE HCL 100 MG/ML IJ SOLN
INTRAMUSCULAR | Status: DC | PRN
  Administered 2016-02-03: 25 mg via INTRAVENOUS
  Administered 2016-02-03: 50 mg via INTRAVENOUS

## 2016-02-03 MED ORDER — LIDOCAINE VISCOUS 2 % MT SOLN
OROMUCOSAL | Status: DC | PRN
  Administered 2016-02-03: 1 via OROMUCOSAL

## 2016-02-03 MED ORDER — ONDANSETRON HCL 4 MG/2ML IJ SOLN
INTRAMUSCULAR | Status: DC | PRN
  Administered 2016-02-03: 4 mg via INTRAVENOUS

## 2016-02-03 MED ORDER — MEPERIDINE HCL 100 MG/ML IJ SOLN
INTRAMUSCULAR | Status: AC
  Filled 2016-02-03: qty 2

## 2016-02-03 MED ORDER — MIDAZOLAM HCL 5 MG/5ML IJ SOLN
INTRAMUSCULAR | Status: AC
  Filled 2016-02-03: qty 10

## 2016-02-03 MED ORDER — MIDAZOLAM HCL 5 MG/5ML IJ SOLN
INTRAMUSCULAR | Status: DC | PRN
  Administered 2016-02-03: 1 mg via INTRAVENOUS
  Administered 2016-02-03: 2 mg via INTRAVENOUS

## 2016-02-03 NOTE — Op Note (Signed)
[facility] Patient Name: Monica [name] Procedure Date: 02/03/2016 8:24 AM MRN: [id number] Date of Birth: 1941-05-05 Attending MD: Norvel Richards , MD CSN: [id number] Age: 75 Admit Type: Outpatient Procedure:                Upper GI endoscopy Indications:              Dysphagia Providers:                Norvel Richards, MD, Janeece Riggers, RN, Georgeann Oppenheim, Technician Referring MD:             Zella Richer. Scotty Court, MD (Referring MD) Medicines:                Midazolam 3 mg IV, Meperidine 75 mg IV Complications:            No immediate complications. Estimated Blood Loss:     Estimated blood loss was minimal. Procedure:                Pre-Anesthesia Assessment:                           - Prior to the procedure, a History and Physical                            was performed, and patient medications and                            allergies were reviewed. The patient's tolerance of                            previous anesthesia was also reviewed. The risks                            and benefits of the procedure and the sedation                            options and risks were discussed with the patient.                            All questions were answered, and informed consent                            was obtained. Prior Anticoagulants: The patient has                            taken no previous anticoagulant or antiplatelet                            agents. ASA Grade Assessment: II - A patient with                            mild systemic disease. After reviewing the risks  and benefits, the patient was deemed in                            satisfactory condition to undergo the procedure.                           After obtaining informed consent, the endoscope was                            passed under direct vision. Throughout the                            procedure, the patient's blood pressure, pulse, and                       oxygen saturations were monitored continuously. The                            EG-[id number] ([id number]) scope was introduced through the                            mouth, and advanced to the second part of duodenum.                            The upper GI endoscopy was accomplished without                            difficulty. The patient tolerated the procedure                            well. Scope In: 8:33:37 AM Scope Out: 8:46:53 AM Total Procedure Duration: 0 hours 13 minutes 16 seconds  Findings:      A moderate Schatzki ring (acquired) was found at the gastroesophageal       junction. The scope was withdrawn. Dilation was performed with a Maloney       dilator with no resistance at 56 Fr. The scope was withdrawn. Dilation       was performed with a Maloney dilator with mild resistance at 68 Fr.       Estimated blood loss was minimal. Look back revealed nice disruption of       the ring with no apparent complications.      The second portion of the duodenum was normal.      A large hiatal hernia was present. This was biopsied with a cold forceps       for histology. Estimated blood loss was minimal.      Localized nodular, "geographically ulcerated", mucosa was found in the       stomach. This was a focal area approximately 6 x 7 cm in dimensions in       the body. Biopsies were taken with a cold forceps for histology. Impression:               - Moderate Schatzki ring. Dilated.                           - Normal second portion of the duodenum.                           -  Large hiatal hernia. Focal area of abnormal                            gastric mucosa. Suspicious for infiltrating                            process. Biopsied. Moderate Sedation:      Moderate (conscious) sedation was administered by the endoscopy nurse       and supervised by the endoscopist. The following parameters were       monitored: oxygen saturation, heart rate, blood pressure, respiratory        rate, EKG, adequacy of pulmonary ventilation, and response to care.       Total physician intraservice time was 13 minutes. Recommendation:           - Patient has a contact number available for                            emergencies. The signs and symptoms of potential                            delayed complications were discussed with the                            patient. Return to normal activities tomorrow.                            Written discharge instructions were provided to the                            patient.                           - Advance diet as tolerated today.                           - Continue present medications.                           - Use Protonix (pantoprazole) 40 mg PO daily. Procedure Code(s):        --- Professional ---                           [id number], Esophagogastroduodenoscopy, flexible,                            transoral; with biopsy, single or multiple                           43450, Dilation of esophagus, by unguided sound or                            bougie, single or multiple passes                           99152, Moderate sedation services provided by the  same physician or other qualified health care                            professional performing the diagnostic or                            therapeutic service that the sedation supports,                            requiring the presence of an independent trained                            observer to assist in the monitoring of the                            patient's level of consciousness and physiological                            status; initial 15 minutes of intraservice time,                            patient age 79 years or older Diagnosis Code(s):        --- Professional ---                           K22.2, Esophageal obstruction                           K44.9, Diaphragmatic hernia without obstruction or                             gangrene                           R13.10, Dysphagia, unspecified CPT copyright 2016 [employer]. All rights reserved. The codes documented in this report are preliminary and upon coder review may  be revised to meet current compliance requirements. Cristopher Estimable. Nahiara Kretzschmar, MD Norvel Richards, MD 02/03/2016 9:03:57 AM This report has been signed electronically. Number of Addenda: 0

## 2016-02-03 NOTE — Discharge Instructions (Signed)
EGD Discharge instructions Please read the instructions outlined below and refer to this sheet in the next few weeks. These discharge instructions provide you with general information on caring for yourself after you leave the hospital. Your doctor may also give you specific instructions. While your treatment has been planned according to the most current medical practices available, unavoidable complications occasionally occur. If you have any problems or questions after discharge, please call your doctor. ACTIVITY  You may resume your regular activity but move at a slower pace for the next 24 hours.   Take frequent rest periods for the next 24 hours.   Walking will help expel (get rid of) the air and reduce the bloated feeling in your abdomen.   No driving for 24 hours (because of the anesthesia (medicine) used during the test).   You may shower.   Do not sign any important legal documents or operate any machinery for 24 hours (because of the anesthesia used during the test).  NUTRITION  Drink plenty of fluids.   You may resume your normal diet.   Begin with a light meal and progress to your normal diet.   Avoid alcoholic beverages for 24 hours or as instructed by your caregiver.  MEDICATIONS  You may resume your normal medications unless your caregiver tells you otherwise.  WHAT YOU CAN EXPECT TODAY  You may experience abdominal discomfort such as a feeling of fullness or gas pains.  FOLLOW-UP  Your doctor will discuss the results of your test with you.  SEEK IMMEDIATE MEDICAL ATTENTION IF ANY OF THE FOLLOWING OCCUR:  Excessive nausea (feeling sick to your stomach) and/or vomiting.   Severe abdominal pain and distention (swelling).   Trouble swallowing.   Temperature over 101 F (37.8 C).   Rectal bleeding or vomiting of blood.    Hiatal hernia and GERD information provided  Continue Protonix 40 mg daily  The stomach appeared abnormal today. Biopsies taken.  Further recommendations to follow once the biopsy report is available for review  Gastroesophageal Reflux Disease, Adult Normally, food travels down the esophagus and stays in the stomach to be digested. However, when a person has gastroesophageal reflux disease (GERD), food and stomach acid move back up into the esophagus. When this happens, the esophagus becomes sore and inflamed. Over time, GERD can create small holes (ulcers) in the lining of the esophagus.  CAUSES This condition is caused by a problem with the muscle between the esophagus and the stomach (lower esophageal sphincter, or LES). Normally, the LES muscle closes after food passes through the esophagus to the stomach. When the LES is weakened or abnormal, it does not close properly, and that allows food and stomach acid to go back up into the esophagus. The LES can be weakened by certain dietary substances, medicines, and medical conditions, including:  Tobacco use.  Pregnancy.  Having a hiatal hernia.  Heavy alcohol use.  Certain foods and beverages, such as coffee, chocolate, onions, and peppermint. RISK FACTORS This condition is more likely to develop in:  People who have an increased body weight.  People who have connective tissue disorders.  People who use NSAID medicines. SYMPTOMS Symptoms of this condition include:  Heartburn.  Difficult or painful swallowing.  The feeling of having a lump in the throat.  Abitter taste in the mouth.  Bad breath.  Having a large amount of saliva.  Having an upset or bloated stomach.  Belching.  Chest pain.  Shortness of breath or wheezing.  Ongoing (  chronic) cough or a night-time cough.  Wearing away of tooth enamel.  Weight loss. Different conditions can cause chest pain. Make sure to see your health care provider if you experience chest pain. DIAGNOSIS Your health care provider will take a medical history and perform a physical exam. To determine if you  have mild or severe GERD, your health care provider may also monitor how you respond to treatment. You may also have other tests, including:  An endoscopy toexamine your stomach and esophagus with a small camera.  A test thatmeasures the acidity level in your esophagus.  A test thatmeasures how much pressure is on your esophagus.  A barium swallow or modified barium swallow to show the shape, size, and functioning of your esophagus. TREATMENT The goal of treatment is to help relieve your symptoms and to prevent complications. Treatment for this condition may vary depending on how severe your symptoms are. Your health care provider may recommend:  Changes to your diet.  Medicine.  Surgery. HOME CARE INSTRUCTIONS Diet  Follow a diet as recommended by your health care provider. This may involve avoiding foods and drinks such as:  Coffee and tea (with or without caffeine).  Drinks that containalcohol.  Energy drinks and sports drinks.  Carbonated drinks or sodas.  Chocolate and cocoa.  Peppermint and mint flavorings.  Garlic and onions.  Horseradish.  Spicy and acidic foods, including peppers, chili powder, curry powder, vinegar, hot sauces, and barbecue sauce.  Citrus fruit juices and citrus fruits, such as oranges, lemons, and limes.  Tomato-based foods, such as red sauce, chili, salsa, and pizza with red sauce.  Fried and fatty foods, such as donuts, french fries, potato chips, and high-fat dressings.  High-fat meats, such as hot dogs and fatty cuts of red and white meats, such as rib eye steak, sausage, ham, and bacon.  High-fat dairy items, such as whole milk, butter, and cream cheese.  Eat small, frequent meals instead of large meals.  Avoid drinking large amounts of liquid with your meals.  Avoid eating meals during the 2-3 hours before bedtime.  Avoid lying down right after you eat.  Do not exercise right after you eat. General Instructions  Pay  attention to any changes in your symptoms.  Take over-the-counter and prescription medicines only as told by your health care provider. Do not take aspirin, ibuprofen, or other NSAIDs unless your health care provider told you to do so.  Do not use any tobacco products, including cigarettes, chewing tobacco, and e-cigarettes. If you need help quitting, ask your health care provider.  Wear loose-fitting clothing. Do not wear anything tight around your waist that causes pressure on your abdomen.  Raise (elevate) the head of your bed 6 inches (15cm).  Try to reduce your stress, such as with yoga or meditation. If you need help reducing stress, ask your health care provider.  If you are overweight, reduce your weight to an amount that is healthy for you. Ask your health care provider for guidance about a safe weight loss goal.  Keep all follow-up visits as told by your health care provider. This is important. SEEK MEDICAL CARE IF:  You have new symptoms.  You have unexplained weight loss.  You have difficulty swallowing, or it hurts to swallow.  You have wheezing or a persistent cough.  Your symptoms do not improve with treatment.  You have a hoarse voice. SEEK IMMEDIATE MEDICAL CARE IF:  You have pain in your arms, neck, jaw, teeth, or  back.  You feel sweaty, dizzy, or light-headed.  You have chest pain or shortness of breath.  You vomit and your vomit looks like blood or coffee grounds.  You faint.  Your stool is bloody or black.  You cannot swallow, drink, or eat.   This information is not intended to replace advice given to you by your health care provider. Make sure you discuss any questions you have with your health care provider.   Document Released: 07/28/2005 Document Revised: 07/09/2015 Document Reviewed: 02/12/2015 Elsevier Interactive Patient Education [employer].

## 2016-02-03 NOTE — H&P (Signed)
HPI:  Monica [name] is a 75 y.o. 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 Pathology with lymphoid population of stomach, slight atypia. Seen in the office on March 3. No change really since that time. Dysphagia improved since dilation but still feels like "it didn't do as well as in the past". Has difficulty with bread, which is the worst. Tries to eat small portions and chew really well. No abdominal pain. No N/V. No lower GI symptoms.    Past Medical History  Diagnosis Date  . Hiatal hernia   . DM (diabetes mellitus) ([place])   . Hyperlipidemia   . Interstitial cystitis   . Depression   . Heart palpitations   . Dysphagia   . Dysrhythmia     History of palpatations    Past Surgical History  Procedure Laterality Date  . Esophagogastroduodenoscopy      RMR: Prominant Schzgzki ring/component of peptic stricture status post dilation and disruption as described above, otherwise norma esophagus, moderate-sized hiatal hernia, antal pyloric channel, and posterier bulbar erosions, otherwise unremarkable stomach, D1 and D2 . Inflammatory findings on the stomach and duodenum will likely be related to aspirin effect. We need to rule out Helicobacter pylor  . Colonoscopy      Dr. Lindalou Hose 2009: Normal per PCP notes  . Esophagogastroduodenoscopy N/A 03/10/2015    Dr. 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 Pathology with lymphoid population of stomach, slight atypia  . Maloney dilation N/A 03/10/2015    Procedure: Venia Minks DILATION; Surgeon: Daneil Dolin, MD; Location: [facility]; Service: Endoscopy; Laterality: N/A;    Current Outpatient Prescriptions  Medication Sig Dispense Refill  . aspirin 81 MG  tablet Take 81 mg by mouth daily.    . Calcium-Magnesium-Vitamin D (CALCIUM 500 PO) Take 500 mg by mouth daily.    . citalopram (CELEXA) 20 MG tablet Take 20 mg by mouth daily.     . metFORMIN (GLUCOPHAGE-XR) 500 MG 24 hr tablet 500 mg daily with breakfast.     . metoprolol tartrate (LOPRESSOR) 25 MG tablet Take 25 mg by mouth once.     . Multiple Vitamin (MULTIVITAMIN) capsule Take 1 capsule by mouth daily.    . pantoprazole (PROTONIX) 40 MG tablet Take 1 tablet (40 mg total) by mouth daily. 90 tablet 3  . simvastatin (ZOCOR) 20 MG tablet Take 20 mg by mouth daily.    Marland Kitchen co-enzyme Q-10 30 MG capsule Take 30 mg by mouth daily. Reported on 01/02/2016     No current facility-administered medications for this visit.    Allergies as of 01/02/2016 - Review Complete 01/02/2016  Allergen Reaction Noted  . Sulfonamide derivatives Hives     Family History  Problem Relation Age of Onset  . Colon cancer Neg Hx     Social History   Social History  . Marital Status: Married    Spouse Name: N/A  . Number of Children: N/A  . Years of Education: N/A   Social History Main Topics  . Smoking status: Current Every Day Smoker -- 0.50 packs/day    Types: Cigarettes  . Smokeless tobacco: None  . Alcohol Use: No  . Drug Use: No  . Sexual Activity: Not Asked   Other Topics Concern  . None   Social History Narrative    Review of Systems: Gen: Denies  fever, chills, anorexia. Denies fatigue, weakness, weight loss.  CV: Denies chest pain, palpitations, syncope, peripheral edema, and claudication. Resp: Denies dyspnea at rest, cough, wheezing, coughing up blood, and pleurisy. GI: see HPI  Derm: Denies rash, itching, dry skin Psych: Denies depression, anxiety, memory loss, confusion. No homicidal or suicidal ideation.  Heme: Denies bruising, bleeding, and enlarged lymph  nodes.  Physical Exam:  General: Alert and oriented. No distress noted. Pleasant and cooperative.  Head: Normocephalic and atraumatic. Eyes: Conjuctiva clear without scleral icterus. Mouth: Oral mucosa pink and moist. Good dentition. No lesions. Heart: S1, S2 present with questionable soft systolic murmur  Abdomen: +BS, soft, non-tender and non-distended. No rebound or guarding. No HSM or masses noted. Msk: Symmetrical without gross deformities. Normal posture. Extremities: Without edema. Neurologic: Alert and oriented x4; grossly normal neurologically. Psych: Alert and cooperative. Normal mood and affect.                 Status: Edited Related Problem: Dysphagia   Expand All Collapse All   75 year old female with history of Schatzki's ring s/p dilation in May 2016; however, gastric biopsies revealed lymphoid population of stomach with slight atypia. She needs surveillance EGD now for further biopsies. As of note, H.pylori serology negative. Continues to have slight difficulty with bread. Question a motility disorder underlying. May benefit from repeat dilation if appropriate at time of EGD. Repeat EGD today with dilation as appropriate. Probably, rebiopsy gastric mucosa.The risks, benefits, limitations, alternatives and imponderables have been reviewed with the patient. Potential for esophageal dilation, biopsy, etc. have also been reviewed.  Questions have been answered. All parties agreeable.

## 2016-02-09 ENCOUNTER — Encounter (HOSPITAL_COMMUNITY): Payer: Self-pay | Admitting: Internal Medicine

## 2016-02-16 DIAGNOSIS — J329 Chronic sinusitis, unspecified: Secondary | ICD-10-CM | POA: Diagnosis not present

## 2016-02-17 ENCOUNTER — Telehealth: Payer: Self-pay | Admitting: Internal Medicine

## 2016-02-17 NOTE — Telephone Encounter (Signed)
Please call patient if results from procedure are ready

## 2016-02-17 NOTE — Telephone Encounter (Signed)
Have not received pathology results as of yet. I called pathology and left a message with Dr. Zettie Pho secretary to see where we stand. I expect a return call sn.

## 2016-02-18 ENCOUNTER — Other Ambulatory Visit: Payer: Self-pay

## 2016-02-18 ENCOUNTER — Telehealth: Payer: Self-pay | Admitting: Internal Medicine

## 2016-02-18 DIAGNOSIS — C8290 Follicular lymphoma, unspecified, unspecified site: Secondary | ICD-10-CM

## 2016-02-18 NOTE — Telephone Encounter (Signed)
Routing to RMR- path results are now in epic

## 2016-02-18 NOTE — Telephone Encounter (Signed)
cc'ed to pcp °

## 2016-02-18 NOTE — Telephone Encounter (Signed)
Referral sent in Epic.

## 2016-02-18 NOTE — Telephone Encounter (Signed)
Biopsy report finally back as there was a delay because special stains need to be done on gastric biopsies.  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 pylori serologically or histologically). I informed her of the diagnosis and the need to see the oncologist. She wants to see Dr. Whitney Muse. We'll expedite getting her appointment.  Incidentally, she states swallowing much better since she's had her esophagus dilated.

## 2016-02-18 NOTE — Telephone Encounter (Signed)
Monica [name], please cc pcp Candy, please refer pt for asap appt with Dr.Penland

## 2016-02-19 ENCOUNTER — Encounter (HOSPITAL_COMMUNITY): Payer: Self-pay

## 2016-03-01 ENCOUNTER — Encounter (HOSPITAL_COMMUNITY): Payer: Medicare Other | Attending: Hematology & Oncology | Admitting: Hematology & Oncology

## 2016-03-01 ENCOUNTER — Encounter (HOSPITAL_COMMUNITY): Payer: Self-pay | Admitting: Hematology & Oncology

## 2016-03-01 ENCOUNTER — Encounter (HOSPITAL_COMMUNITY): Payer: Medicare Other | Attending: Hematology & Oncology

## 2016-03-01 VITALS — BP 162/54 | HR 91 | Temp 98.0°F | Resp 16 | Ht 66.0 in | Wt 174.6 lb

## 2016-03-01 DIAGNOSIS — K449 Diaphragmatic hernia without obstruction or gangrene: Secondary | ICD-10-CM | POA: Diagnosis not present

## 2016-03-01 DIAGNOSIS — Z9889 Other specified postprocedural states: Secondary | ICD-10-CM | POA: Diagnosis not present

## 2016-03-01 DIAGNOSIS — F17210 Nicotine dependence, cigarettes, uncomplicated: Secondary | ICD-10-CM | POA: Insufficient documentation

## 2016-03-01 DIAGNOSIS — E785 Hyperlipidemia, unspecified: Secondary | ICD-10-CM | POA: Diagnosis not present

## 2016-03-01 DIAGNOSIS — R1310 Dysphagia, unspecified: Secondary | ICD-10-CM | POA: Diagnosis not present

## 2016-03-01 DIAGNOSIS — Z720 Tobacco use: Secondary | ICD-10-CM

## 2016-03-01 DIAGNOSIS — Z7982 Long term (current) use of aspirin: Secondary | ICD-10-CM | POA: Insufficient documentation

## 2016-03-01 DIAGNOSIS — F329 Major depressive disorder, single episode, unspecified: Secondary | ICD-10-CM | POA: Insufficient documentation

## 2016-03-01 DIAGNOSIS — C8590 Non-Hodgkin lymphoma, unspecified, unspecified site: Secondary | ICD-10-CM | POA: Diagnosis not present

## 2016-03-01 DIAGNOSIS — E119 Type 2 diabetes mellitus without complications: Secondary | ICD-10-CM | POA: Diagnosis not present

## 2016-03-01 DIAGNOSIS — Z7984 Long term (current) use of oral hypoglycemic drugs: Secondary | ICD-10-CM | POA: Diagnosis not present

## 2016-03-01 DIAGNOSIS — C884 Extranodal marginal zone B-cell lymphoma of mucosa-associated lymphoid tissue [MALT-lymphoma]: Secondary | ICD-10-CM | POA: Diagnosis not present

## 2016-03-01 DIAGNOSIS — Z808 Family history of malignant neoplasm of other organs or systems: Secondary | ICD-10-CM | POA: Insufficient documentation

## 2016-03-01 LAB — COMPREHENSIVE METABOLIC PANEL
ALBUMIN: 3.9 g/dL (ref 3.5–5.0)
ALK PHOS: 68 U/L (ref 38–126)
ALT: 16 U/L (ref 14–54)
ANION GAP: 9 (ref 5–15)
AST: 22 U/L (ref 15–41)
BILIRUBIN TOTAL: 0.4 mg/dL (ref 0.3–1.2)
BUN: 14 mg/dL (ref 6–20)
CALCIUM: 8.7 mg/dL — AB (ref 8.9–10.3)
CO2: 24 mmol/L (ref 22–32)
CREATININE: 0.66 mg/dL (ref 0.44–1.00)
Chloride: 104 mmol/L (ref 101–111)
Glucose, Bld: 120 mg/dL — ABNORMAL HIGH (ref 65–99)
Potassium: 3.8 mmol/L (ref 3.5–5.1)
SODIUM: 137 mmol/L (ref 135–145)
TOTAL PROTEIN: 7.3 g/dL (ref 6.5–8.1)

## 2016-03-01 LAB — CBC WITH DIFFERENTIAL/PLATELET
BASOS ABS: 0 10*3/uL (ref 0.0–0.1)
BASOS PCT: 0 %
EOS ABS: 0.2 10*3/uL (ref 0.0–0.7)
EOS PCT: 2 %
HCT: 37.3 % (ref 36.0–46.0)
Hemoglobin: 11.9 g/dL — ABNORMAL LOW (ref 12.0–15.0)
Lymphocytes Relative: 30 %
Lymphs Abs: 2.2 10*3/uL (ref 0.7–4.0)
MCH: 26.6 pg (ref 26.0–34.0)
MCHC: 31.9 g/dL (ref 30.0–36.0)
MCV: 83.3 fL (ref 78.0–100.0)
MONO ABS: 0.6 10*3/uL (ref 0.1–1.0)
MONOS PCT: 8 %
NEUTROS ABS: 4.3 10*3/uL (ref 1.7–7.7)
Neutrophils Relative %: 60 %
PLATELETS: 278 10*3/uL (ref 150–400)
RBC: 4.48 MIL/uL (ref 3.87–5.11)
RDW: 16.1 % — AB (ref 11.5–15.5)
WBC: 7.3 10*3/uL (ref 4.0–10.5)

## 2016-03-01 LAB — LACTATE DEHYDROGENASE: LDH: 131 U/L (ref 98–192)

## 2016-03-01 NOTE — Patient Instructions (Addendum)
[place] at [facility] Discharge Instructions  RECOMMENDATIONS MADE BY THE CONSULTANT AND ANY TEST RESULTS WILL BE SENT TO YOUR REFERRING PHYSICIAN.   Exam and discussion by Dr Whitney Muse today Lab work today Radiation if you would need it would be better at [place] scan scheduled  Return to see the doctor after the PET scan  Please call the clinic if you have any questions or concerns      Thank you for choosing [place] at [facility] to provide your oncology and hematology care.  To afford each patient quality time with our provider, please arrive at least 15 minutes before your scheduled appointment time.   Beginning January 23rd 2017 lab work for the [facility] will be done in the  Main lab at [facility] on 1st floor. If you have a lab appointment with the [facility] please come in thru the  Main Entrance and check in at the main information desk  You need to re-schedule your appointment should you arrive 10 or more minutes late.  We strive to give you quality time with our providers, and arriving late affects you and other patients whose appointments are after yours.  Also, if you no show three or more times for appointments you may be dismissed from the clinic at the providers discretion.     Again, thank you for choosing [facility].  Our hope is that these requests will decrease the amount of time that you wait before being seen by our physicians.       _____________________________________________________________  Should you have questions after your visit to [facility], please contact our office at [phone] between the hours of 8:30 a.m. and 4:30 p.m.  Voicemails left after 4:30 p.m. will not be returned until the following business day.  For prescription refill requests, have your pharmacy contact our office.         Resources For Cancer Patients and their  Caregivers ? American Cancer Society: Can assist with transportation, wigs, general needs, runs Look Good Feel Better.        651-621-4472 ? Cancer Care: Provides financial assistance, online support groups, medication/co-pay assistance.  1-800-813-HOPE 970-391-0398) ? [facility] Assists [place] cancer patients and their families through emotional , educational and financial support.  431-194-7120 ? [place] DSS Where to apply for food stamps, Medicaid and utility assistance. 445 512 1024 ? [facility]: Transportation to medical appointments. 905-796-6153 ? Social Security Administration: May apply for disability if have a Stage IV cancer. (541)067-6440 856 214 9849 ? [employer], Disability and Transit Services: Assists with nutrition, care and transit needs. 509-260-5862

## 2016-03-01 NOTE — Progress Notes (Signed)
[facility]  Progress Note  Patient Care Team: Zella Richer. Scotty Court, MD as PCP - General (Family Medicine) Daneil Dolin, MD as Consulting Physician (Gastroenterology)  CHIEF COMPLAINTS/PURPOSE OF CONSULTATION:  NHL, Malt Lymphoma H. Pylori Negative  HISTORY OF PRESENTING ILLNESS:  Monica [name] 75 y.o. 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 was here with her husband today. She notes a history of dysphagia with an endoscopy in May 2016, pathology showed a lymphoid population of stomach with slight atypia.  She presented to GI in March for ongoing follow-up. H. Pylori serology was negative. She notes she is aware of her diagnosis.   She underwent repeat EGD on 02/03/2016 with Dr. Gala Romney, abnormal area of gastric mucosa was biopsied. 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 - BACKGROUND CHRONIC ACTIVE GASTRITIS. - NEGATIVE FOR HELICOBACTER PYLORI  She reports she was sick all winter. She had a sinus infection and chronic bronchitis. She had 4 rounds of antibiotics. After this she got a UTI and was put on another antibiotic. She has been on 5 different antibiotics since December.   She says she feels good and has more energy then her husband.   She eats well and doesn't have any stomach problems other than acid reflux.  She likes to "go" and stay outside and work in the yard.  She smokes less than a pack a day of cigarettes.   She sees Dr. Gala Romney again the first week of July.  She has not had a mammogram in about 3 years.   She denies any abdominal pain or blood in her stool. She presents today for additional discussion and recommendations.   MEDICAL HISTORY:  Past Medical History  Diagnosis Date  . Hiatal hernia   . DM (diabetes mellitus) ([place])   . Hyperlipidemia   . Interstitial cystitis   . Depression   . Heart palpitations   . Dysphagia   . Dysrhythmia     History of  palpatations  . Sinusitis     SURGICAL HISTORY: Past Surgical History  Procedure Laterality Date  . Esophagogastroduodenoscopy      RMR: Prominant Schzgzki ring/component of peptic stricture status post dilation and disruption as described above, otherwise norma esophagus, moderate-sized hiatal hernia, antal pyloric channel, and posterier bulbar erosions, otherwise unremarkable stomach, D1 and D2 . Inflammatory findings on the stomach and duodenum will likely be related to aspirin effect. We need to rule out Helicobacter pylor  . Colonoscopy      Dr. Lindalou Hose 2009: Normal per PCP notes  . Esophagogastroduodenoscopy N/A 03/10/2015    Dr. 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 Pathology with lymphoid population of stomach, slight atypia  . Maloney dilation N/A 03/10/2015    Procedure: Venia Minks DILATION;  Surgeon: Daneil Dolin, MD;  Location: [facility];  Service: Endoscopy;  Laterality: N/A;  . Esophagogastroduodenoscopy N/A 02/03/2016    Procedure: ESOPHAGOGASTRODUODENOSCOPY (EGD);  Surgeon: Daneil Dolin, MD;  Location: [facility];  Service: Endoscopy;  Laterality: N/A;  830   . Maloney dilation N/A 02/03/2016    Procedure: Venia Minks DILATION;  Surgeon: Daneil Dolin, MD;  Location: [facility];  Service: Endoscopy;  Laterality: N/A;    SOCIAL HISTORY: Social History   Social History  . Marital Status: Married    Spouse Name: N/A  . Number of Children: N/A  . Years of Education: N/A  Occupational History  . Not on file.   Social History Main Topics  . Smoking status: Current Every Day Smoker -- 0.50 packs/day    Types: Cigarettes  . Smokeless tobacco: Not on file  . Alcohol Use: No  . Drug Use: No  . Sexual Activity: Not on file   Other Topics Concern  . Not on file   Social History Narrative  Married; 75 years 4 children 5 grandsons; 1 great grandchild 88 currently; Almost a pack a day;  started at 75 years old. 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 Colon cancer Neg Hx   Mother-is living at 64 years old Father-died of heart attack; early 39's 1 brother 3 sisters 1 sister bad type 1 diabetic and died 25 years ago  ALLERGIES:  is allergic to sulfonamide derivatives.  MEDICATIONS:  Current Outpatient Prescriptions  Medication Sig Dispense Refill  . aspirin 81 MG tablet Take 81 mg by mouth daily.    Marland Kitchen BIOGAIA PROBIOTIC (BIOGAIA PROBIOTIC) LIQD Take by mouth daily at 8 pm.    . Calcium-Magnesium-Vitamin D (CALCIUM 500 PO) Take 500 mg by mouth daily.    . citalopram (CELEXA) 20 MG tablet Take 20 mg by mouth daily.     . metFORMIN (GLUCOPHAGE-XR) 500 MG 24 hr tablet Take 500 mg by mouth daily with breakfast.     . metoprolol tartrate (LOPRESSOR) 25 MG tablet Take 25 mg by mouth daily.     . Multiple Vitamin (MULTIVITAMIN) capsule Take 1 capsule by mouth daily.    . pantoprazole (PROTONIX) 40 MG tablet Take 1 tablet (40 mg total) by mouth daily. 90 tablet 3  . simvastatin (ZOCOR) 20 MG tablet Take 20 mg by mouth every evening.      No current facility-administered medications for this visit.    Review of Systems  Constitutional: Negative.  Negative for fever, chills, weight loss and malaise/fatigue.  HENT: Negative.  Negative for congestion, hearing loss, nosebleeds, sore throat and tinnitus.   Eyes: Negative.  Negative for blurred vision, double vision, pain and discharge.  Respiratory: Negative.  Negative for cough, hemoptysis, sputum production, shortness of breath and wheezing.   Cardiovascular: Negative.  Negative for chest pain, palpitations, claudication, leg swelling and PND.  Gastrointestinal: Negative for heartburn, nausea, vomiting, abdominal pain, diarrhea, constipation, blood in stool and melena.       Has acid reflux  Genitourinary: Negative.   Negative for dysuria, urgency, frequency and hematuria.  Musculoskeletal: Negative.  Negative for myalgias, joint pain and falls.  Skin: Negative.  Negative for itching and rash.  Neurological: Negative.  Negative for dizziness, tingling, tremors, sensory change, speech change, focal weakness, seizures, loss of consciousness, weakness and headaches.  Endo/Heme/Allergies: Negative.  Does not bruise/bleed easily.  Psychiatric/Behavioral: Negative.  Negative for depression, suicidal ideas, memory loss and substance abuse. The patient is not nervous/anxious and does not have insomnia.   All other systems reviewed and are negative.  14 point ROS was done and is otherwise as detailed above or in HPI   PHYSICAL EXAMINATION: ECOG PERFORMANCE STATUS: 0 - Asymptomatic  Filed Vitals:   03/01/16 1400  BP: 162/54  Pulse: 91  Temp: 98 F (36.7 C)  Resp: 16   Filed Weights   03/01/16 1400  Weight: 174 lb 9.6 oz (79.198 kg)    Physical Exam  Constitutional: She is oriented to person, place, and time and well-developed, well-nourished,  and in no distress.  HENT:  Head: Normocephalic and atraumatic.  Nose: Nose normal.  Mouth/Throat: Oropharynx is clear and moist. No oropharyngeal exudate.  Eyes: Conjunctivae and EOM are normal. Pupils are equal, round, and reactive to light. Right eye exhibits no discharge. Left eye exhibits no discharge. No scleral icterus.  Neck: Normal range of motion. Neck supple. No tracheal deviation present. No thyromegaly present.  Cardiovascular: Normal rate, regular rhythm and normal heart sounds.  Exam reveals no gallop and no friction rub.   No murmur heard. Pulmonary/Chest: Effort normal and breath sounds normal. She has no wheezes. She has no rales.  Abdominal: Soft. Bowel sounds are normal. She exhibits no distension and no mass. There is no tenderness. There is no rebound and no guarding.  Musculoskeletal: Normal range of motion. She exhibits no edema.    Lymphadenopathy:    She has no cervical adenopathy.  Neurological: She is alert and oriented to person, place, and time. She has normal reflexes. No cranial nerve deficit. Gait normal. Coordination normal.  Skin: Skin is warm and dry. No rash noted.  Psychiatric: Mood, memory, affect and judgment normal.  Nursing note and vitals reviewed.   LABORATORY DATA:  I have reviewed the data as listed      PATHOLOGY:     ASSESSMENT & PLAN:  NHL, Malt Lymphoma H. Pylori Negative Tobacco Use  We discussed Gastric Malt Lymphoma, its frequent association with H.pylori. She has been seronegative for H. Pylori. Recent biopsy was also negative for H. Pylori. We reviewed the NCCN guidelines for staging, diagnosis and treatment of Gastric Malt Lymphoma.   We will complete workup with PET/CT, stool antigen testing for H. Pylori. We discussed that advanced disease is unlikely and I anticipate that since she is H. Pylori negative we will make a referral to radiation oncology for ISRT.  She will return post PET for additional recommendations and to review results.   Orders Placed This Encounter  Procedures  . CBC with Differential    Standing Status: Future     Number of Occurrences: 1     Standing Expiration Date: 03/01/2017  . Lactate dehydrogenase    Standing Status: Future     Number of Occurrences: 1     Standing Expiration Date: 03/01/2017  . Comprehensive metabolic panel    Standing Status: Future     Number of Occurrences: 1     Standing Expiration Date: 03/01/2017    All questions were answered. The patient knows to call the clinic with any problems, questions or concerns.  This document serves as a record of services personally performed by Ancil Linsey, MD. It was created on her behalf by Kandace Blitz, a trained medical scribe. The creation of this record is based on the scribe's personal observations and the provider's statements to them. This document has been checked and  approved by the attending provider.  I have reviewed the above documentation for accuracy and completeness, and I agree with the above.  This note was electronically signed.    Molli Hazard, MD  03/01/2016 3:40 PM

## 2016-03-02 LAB — HCV COMMENT:

## 2016-03-02 LAB — HEPATITIS C ANTIBODY (REFLEX): HCV Ab: <0.1 s/co ratio (ref 0.0–0.9)

## 2016-03-03 ENCOUNTER — Encounter
Admission: RE | Admit: 2016-03-03 | Discharge: 2016-03-03 | Disposition: A | Discharge status: Home / Self Care | Payer: Medicare Other | Source: Ambulatory Visit | Attending: Hematology & Oncology | Admitting: Hematology & Oncology

## 2016-03-03 DIAGNOSIS — C884 Extranodal marginal zone B-cell lymphoma of mucosa-associated lymphoid tissue [MALT-lymphoma]: Secondary | ICD-10-CM | POA: Insufficient documentation

## 2016-03-03 DIAGNOSIS — C8590 Non-Hodgkin lymphoma, unspecified, unspecified site: Secondary | ICD-10-CM | POA: Diagnosis not present

## 2016-03-03 LAB — PROTEIN ELECTROPHORESIS, SERUM
A/G Ratio: 1.3 (ref 0.7–1.7)
ALBUMIN ELP: 3.7 g/dL (ref 2.9–4.4)
ALPHA-2-GLOBULIN: 0.8 g/dL (ref 0.4–1.0)
Alpha-1-Globulin: 0.1 g/dL (ref 0.0–0.4)
BETA GLOBULIN: 1 g/dL (ref 0.7–1.3)
GAMMA GLOBULIN: 0.9 g/dL (ref 0.4–1.8)
Globulin, Total: 2.9 g/dL (ref 2.2–3.9)
Total Protein ELP: 6.6 g/dL (ref 6.0–8.5)

## 2016-03-03 LAB — IMMUNOFIXATION ELECTROPHORESIS
IGA: 145 mg/dL (ref 64–422)
IGM, SERUM: 59 mg/dL (ref 26–217)
IgG (Immunoglobin G), Serum: 1009 mg/dL (ref 700–1600)
Total Protein ELP: 6.4 g/dL (ref 6.0–8.5)

## 2016-03-03 LAB — GLUCOSE, CAPILLARY: GLUCOSE-CAPILLARY: 111 mg/dL — AB (ref 65–99)

## 2016-03-03 MED ORDER — FLUDEOXYGLUCOSE F - 18 (FDG) INJECTION
13.2000 | Freq: Once | INTRAVENOUS | Status: AC | PRN
  Administered 2016-03-03: 13.2 via INTRAVENOUS

## 2016-03-04 ENCOUNTER — Encounter (HOSPITAL_COMMUNITY): Payer: Self-pay | Admitting: Hematology & Oncology

## 2016-03-04 LAB — H.PYLORI ANTIGEN, STOOL

## 2016-03-05 ENCOUNTER — Encounter (HOSPITAL_BASED_OUTPATIENT_CLINIC_OR_DEPARTMENT_OTHER): Payer: Medicare Other | Admitting: Hematology & Oncology

## 2016-03-05 ENCOUNTER — Encounter (HOSPITAL_COMMUNITY): Payer: Self-pay | Admitting: Hematology & Oncology

## 2016-03-05 VITALS — BP 168/68 | HR 79 | Temp 97.6°F | Resp 16 | Wt 173.0 lb

## 2016-03-05 DIAGNOSIS — C884 Extranodal marginal zone B-cell lymphoma of mucosa-associated lymphoid tissue [MALT-lymphoma]: Secondary | ICD-10-CM

## 2016-03-05 DIAGNOSIS — Z720 Tobacco use: Secondary | ICD-10-CM

## 2016-03-05 DIAGNOSIS — R911 Solitary pulmonary nodule: Secondary | ICD-10-CM | POA: Diagnosis not present

## 2016-03-05 NOTE — Progress Notes (Signed)
[place]  PROGRESS NOTE  Patient Care Team: Zella Richer. Scotty Court, MD as PCP - General (Family Medicine) Daneil Dolin, MD as Consulting Physician (Gastroenterology)  CHIEF COMPLAINTS/PURPOSE OF CONSULTATION:  NHL, Malt Lymphoma H. Pylori Negative    MALToma ([place])   03/21/2015 Miscellaneous H Pylori IgG NEGATIVE   02/03/2016 Procedure EGD Dr. Gala Romney, abnormal gastric mucosa. Pathology with EXTRANODAL Marginal zone lymphoma. NEGATIVE for H. Pylori   03/03/2016 Miscellaneous H pylori stool antigen NEGATIVE   03/03/2016 PET scan Focal area of hypermetabolism and wall thickening involving the body antral junction region of the stomach c/w history of lymphoma. 5.5 mm LLL pulm nodule not hypermetabolic. Non contrast chest CT in 6 month     HISTORY OF PRESENTING ILLNESS:  Monica [name] 75 y.o. female is here for follow-up of Gastric MALT lymphoma  Monica [name] was here with her husband today. She notes a history of dysphagia with an endoscopy in May 2016, pathology showed a lymphoid population of stomach with slight atypia.  She presented to GI in March for ongoing follow-up. H. Pylori serology was negative. She notes she is aware of her diagnosis.   She underwent repeat EGD on 02/03/2016 with Dr. Gala Romney, abnormal area of gastric mucosa was biopsied. 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 - BACKGROUND CHRONIC ACTIVE GASTRITIS. - NEGATIVE FOR HELICOBACTER PYLORI  Monica [name] returns to the [place] accompanied by her husband.  She is here today to review studies obtained at her last visit and PET/CT.  She takes Calcium once a day, every night. She was advised to take it twice a day for a while. Her hemoglobin was also 11.9. Her stool test for the H. pylori was negative, PET was reviewed in detail and shows localized disease.  We discussed the fact that she can receive radiation therapy for her disease.  MEDICAL  HISTORY:  Past Medical History  Diagnosis Date  . Hiatal hernia   . DM (diabetes mellitus) ([place])   . Hyperlipidemia   . Interstitial cystitis   . Depression   . Heart palpitations   . Dysphagia   . Dysrhythmia     History of palpatations  . Sinusitis     SURGICAL HISTORY: Past Surgical History  Procedure Laterality Date  . Esophagogastroduodenoscopy      RMR: Prominant Schzgzki ring/component of peptic stricture status post dilation and disruption as described above, otherwise norma esophagus, moderate-sized hiatal hernia, antal pyloric channel, and posterier bulbar erosions, otherwise unremarkable stomach, D1 and D2 . Inflammatory findings on the stomach and duodenum will likely be related to aspirin effect. We need to rule out Helicobacter pylor  . Colonoscopy      Dr. Lindalou Hose 2009: Normal per PCP notes  . Esophagogastroduodenoscopy N/A 03/10/2015    Dr. 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 Pathology with lymphoid population of stomach, slight atypia  . Maloney dilation N/A 03/10/2015    Procedure: Venia Minks DILATION;  Surgeon: Daneil Dolin, MD;  [facility];  Service: Endoscopy;  Laterality: N/A;  . Esophagogastroduodenoscopy N/A 02/03/2016    Procedure: ESOPHAGOGASTRODUODENOSCOPY (EGD);  Surgeon: Daneil Dolin, MD;  Location: [facility];  Service: Endoscopy;  Laterality: N/A;  830   . Maloney dilation N/A 02/03/2016    Procedure: Venia Minks DILATION;  Surgeon: Daneil Dolin, MD;  Location: [facility];  Service: Endoscopy;  Laterality: N/A;    SOCIAL  HISTORY: Social History   Social History  . Marital Status: Married    Spouse Name: N/A  . Number of Children: N/A  . Years of Education: N/A   Occupational History  . Not on file.   Social History Main Topics  . Smoking status: Current Every Day Smoker -- 0.50 packs/day    Types: Cigarettes  . Smokeless tobacco: Not on file  . Alcohol  Use: No  . Drug Use: No  . Sexual Activity: Not on file   Other Topics Concern  . Not on file   Social History Narrative  Married; 52 years 4 children 5 grandsons; 1 great grandchild 88 currently; Almost a pack a day; started at 75 years old. 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 Colon cancer Neg Hx   Mother-is living at 46 years old Father-died of heart attack; early 23's 1 brother 3 sisters 1 sister bad type 1 diabetic and died 11 years ago 1 son died 8 years ago of cancer; had a tumor removed from his spine 1 son committed suicide in the 43's  ALLERGIES:  is allergic to sulfonamide derivatives.  MEDICATIONS:  Current Outpatient Prescriptions  Medication Sig Dispense Refill  . aspirin 81 MG tablet Take 81 mg by mouth daily.    Marland Kitchen BIOGAIA PROBIOTIC (BIOGAIA PROBIOTIC) LIQD Take by mouth daily at 8 pm.    . Calcium-Magnesium-Vitamin D (CALCIUM 500 PO) Take 500 mg by mouth daily.    . citalopram (CELEXA) 20 MG tablet Take 20 mg by mouth daily.     . metFORMIN (GLUCOPHAGE-XR) 500 MG 24 hr tablet Take 500 mg by mouth daily with breakfast.     . metoprolol tartrate (LOPRESSOR) 25 MG tablet Take 25 mg by mouth daily.     . Multiple Vitamin (MULTIVITAMIN) capsule Take 1 capsule by mouth daily.    . pantoprazole (PROTONIX) 40 MG tablet Take 1 tablet (40 mg total) by mouth daily. 90 tablet 3  . simvastatin (ZOCOR) 20 MG tablet Take 20 mg by mouth every evening.      No current facility-administered medications for this visit.    Review of Systems  Constitutional: Negative.  Negative for fever, chills, weight loss and malaise/fatigue.  HENT: Negative.  Negative for congestion, hearing loss, nosebleeds, sore throat and tinnitus.   Eyes: Negative.  Negative for blurred vision, double vision, pain and discharge.  Respiratory: Negative.  Negative for cough, hemoptysis,  sputum production, shortness of breath and wheezing.   Cardiovascular: Negative.  Negative for chest pain, palpitations, claudication, leg swelling and PND.  Gastrointestinal: Negative for heartburn, nausea, vomiting, abdominal pain, diarrhea, constipation, blood in stool and melena.       Has acid reflux  Genitourinary: Negative.  Negative for dysuria, urgency, frequency and hematuria.  Musculoskeletal: Negative.  Negative for myalgias, joint pain and falls.  Skin: Negative.  Negative for itching and rash.  Neurological: Negative.  Negative for dizziness, tingling, tremors, sensory change, speech change, focal weakness, seizures, loss of consciousness, weakness and headaches.  Endo/Heme/Allergies: Negative.  Does not bruise/bleed easily.  Psychiatric/Behavioral: Negative.  Negative for depression, suicidal ideas, memory loss and substance abuse. The patient is not nervous/anxious and does not have insomnia.   All other systems reviewed and are negative.  14 point ROS was done and is otherwise as detailed above or in HPI   PHYSICAL EXAMINATION: ECOG PERFORMANCE STATUS: 0 - Asymptomatic  Filed  Vitals:   03/05/16 1400  BP: 168/68  Pulse: 79  Temp: 97.6 F (36.4 C)  Resp: 16   Filed Weights   03/05/16 1400  Weight: 173 lb (78.472 kg)    Physical Exam  Constitutional: She is oriented to person, place, and time and well-developed, well-nourished, and in no distress.  HENT:  Head: Normocephalic and atraumatic.  Nose: Nose normal.  Mouth/Throat: Oropharynx is clear and moist. No oropharyngeal exudate.  Eyes: Conjunctivae and EOM are normal. Pupils are equal, round, and reactive to light. Right eye exhibits no discharge. Left eye exhibits no discharge. No scleral icterus.  Neck: Normal range of motion. Neck supple. No tracheal deviation present. No thyromegaly present.  Cardiovascular: Normal rate, regular rhythm and normal heart sounds.  Exam reveals no gallop and no friction rub.     No murmur heard. Pulmonary/Chest: Effort normal and breath sounds normal. She has no wheezes. She has no rales.  Abdominal: Soft. Bowel sounds are normal. She exhibits no distension and no mass. There is no tenderness. There is no rebound and no guarding.  Musculoskeletal: Normal range of motion. She exhibits no edema.  Lymphadenopathy:    She has no cervical adenopathy.  Neurological: She is alert and oriented to person, place, and time. She has normal reflexes. No cranial nerve deficit. Gait normal. Coordination normal.  Skin: Skin is warm and dry. No rash noted.  Psychiatric: Mood, memory, affect and judgment normal.  Nursing note and vitals reviewed.   LABORATORY DATA:  I have reviewed the data as listed   Results for RIVERLYN, KIZZIAH (MRN [id number]) as of 03/05/2016 08:23  Ref. Range 03/01/2016 16:05  Comment Unknown   Sodium Latest Ref Range: 135-145 mmol/L 137  Potassium Latest Ref Range: 3.5-5.1 mmol/L 3.8  Chloride Latest Ref Range: 101-111 mmol/L 104  CO2 Latest Ref Range: 22-32 mmol/L 24  BUN Latest Ref Range: 6-20 mg/dL 14  Creatinine Latest Ref Range: 0.44-1.00 mg/dL 0.66  Calcium Latest Ref Range: 8.9-10.3 mg/dL 8.7 (L)  EGFR (Non-African Amer.) Latest Ref Range: >60 mL/min >60  EGFR (African American) Latest Ref Range: >60 mL/min >60  Glucose Latest Ref Range: 65-99 mg/dL 120 (H)  Anion gap Latest Ref Range: 5-15  9  Alkaline Phosphatase Latest Ref Range: 38-126 U/L 68  Albumin Latest Ref Range: 3.5-5.0 g/dL 3.9  AST Latest Ref Range: 15-41 U/L 22  ALT Latest Ref Range: 14-54 U/L 16  Total Protein Latest Ref Range: 6.5-8.1 g/dL 7.3  Total Bilirubin Latest Ref Range: 0.3-1.2 mg/dL 0.4  LDH Latest Ref Range: 98-192 U/L 131  Total Protein ELP Latest Ref Range: 6.0-8.5 g/dL 6.4  Albumin ELP Latest Ref Range: 2.9-4.4 g/dL   Globulin, Total Latest Ref Range: 2.2-3.9 g/dL   A/G Ratio Latest Ref Range: 0.7-1.7    Alpha-1-Globulin Latest Ref Range: 0.0-0.4 g/dL    Alpha-2-Globulin Latest Ref Range: 0.4-1.0 g/dL   Beta Globulin Latest Ref Range: 0.7-1.3 g/dL   Gamma Globulin Latest Ref Range: 0.4-1.8 g/dL   M-SPIKE, % Latest Ref Range: Not Observed g/dL   SPE Interp. Unknown   IgG (Immunoglobin G), Serum Latest Ref Range: [phone] mg/dL 1009  IgA Latest Ref Range: 64-422 mg/dL 145  IgM, Serum Latest Ref Range: 26-217 mg/dL 59  WBC Latest Ref Range: 4.0-10.5 K/uL 7.3  RBC Latest Ref Range: 3.87-5.11 MIL/uL 4.48  Hemoglobin Latest Ref Range: 12.0-15.0 g/dL 11.9 (L)  HCT Latest Ref Range: 36.0-46.0 % 37.3  MCV Latest Ref Range: 78.0-100.0 fL 83.3  MCH  Latest Ref Range: 26.0-34.0 pg 26.6  MCHC Latest Ref Range: 30.0-36.0 g/dL 31.9  RDW Latest Ref Range: 11.5-15.5 % 16.1 (H)  Platelets Latest Ref Range: 150-400 K/uL 278  Neutrophils Latest Units: % 60  Lymphocytes Latest Units: % 30  Monocytes Relative Latest Units: % 8  Eosinophil Latest Units: % 2  Basophil Latest Units: % 0  NEUT# Latest Ref Range: 1.7-7.7 K/uL 4.3  Lymphocyte # Latest Ref Range: 0.7-4.0 K/uL 2.2  Monocyte # Latest Ref Range: 0.1-1.0 K/uL 0.6  Eosinophils Absolute Latest Ref Range: 0.0-0.7 K/uL 0.2  Basophils Absolute Latest Ref Range: 0.0-0.1 K/uL 0.0  Immunofixation Result, Serum Unknown Comment  HCV Ab Latest Ref Range: 0.0-0.9 s/co ratio <0.1     PATHOLOGY:    CLINICAL DATA: Initial treatment strategy for non-Hodgkin's lymphoma (stomach).  EXAM: NUCLEAR MEDICINE PET SKULL BASE TO THIGH  TECHNIQUE: 13.2 mCi F-18 FDG was injected intravenously. Full-ring PET imaging was performed from the skull base to thigh after the radiotracer. CT data was obtained and used for attenuation correction and anatomic localization.  FASTING BLOOD GLUCOSE: Value: 111 mg/dl  COMPARISON: CT scan 11/05/2014  FINDINGS: NECK  No hypermetabolic lymph nodes in the neck.  CHEST  No hypermetabolic mediastinal or hilar nodes. No suspicious pulmonary nodules on the CT  scan.  Underlying emphysematous changes and pulmonary scarring. 5.5 mm subpleural left lower lobe pulmonary nodule on image 110 is not hypermetabolic but will require follow-up.  ABDOMEN/PELVIS  No abnormal hypermetabolic activity within the liver, pancreas, adrenal glands, or spleen. No hypermetabolic lymph nodes in the abdomen or pelvis.  There is a large hiatal containing a good portion of the fundus and body of the stomach. Mild diffuse uptake is noted. More significant uptake in the junction region of the body and antral region stomach with wall thickening. SUV max is 8.5. This is likely site of the patient's lymphoma. No gastrohepatic ligament or celiac axis lymphadenopathy. No worrisome hypermetabolic liver lesions. The spleen is normal in size. No focal lesions. No retroperitoneal lymphadenopathy.  SKELETON  No focal hypermetabolic activity to suggest skeletal metastasis.  IMPRESSION: 1. Focal area of hypermetabolism and wall thickening involving the body antral junction region of the stomach consistent with patient's history of lymphoma. No extra gastric disease is identified. 2. Large hiatal hernia. 3. 5.5 mm left lower lobe pulmonary nodule is not hypermetabolic but is likely below the limits of sensitivity for PET-CT. Recommend followup noncontrast chest CT in 6 months. 4. Advanced atherosclerotic calcifications involving the aorta and iliac arteries and coronary artery calcifications.   Electronically Signed  By: Marijo Sanes M.D.  On: 03/03/2016 15:36 ASSESSMENT & PLAN:  NHL, Malt Lymphoma H. Pylori Negative Tobacco Use LLL pulmonary nodule  We reviewed her blood work and stool antigen test. She has had a thorough evaluation for H. Pylori, all results are negative. We discussed her PET, she has localized disease. I am going to refer her to radiation oncology for consideration of radiation.  She will need follow up CT in 6 months to follow the  small pulmonary nodule in the LLL, she is a smoker.  I addressed the importance of smoking cessation with the patient in detail.  We discussed the health benefits of cessation.  We discussed the health detriments of ongoing tobacco use including but not limited to COPD, heart disease and malignancy. We reviewed the multiple options for cessation and I offered to refer her to smoking cessation classes. We discussed other alternatives to quit such as  chantix, wellbutrin. We will continue to address this moving forward.  She was encouraged to increase calcium to bid.  We discussed how she will need surveillance after her radiation and continued follow-up.  All questions were answered. The patient knows to call the clinic with any problems, questions or concerns.  This document serves as a record of services personally performed by Ancil Linsey, MD. It was created on her behalf by Toni Amend, a trained medical scribe. The creation of this record is based on the scribe's personal observations and the provider's statements to them. This document has been checked and approved by the attending provider.  I have reviewed the above documentation for accuracy and completeness, and I agree with the above.  This note was electronically signed.    Molli Hazard, MD  03/12/2016 9:35 AM

## 2016-03-05 NOTE — Patient Instructions (Addendum)
[place] at [facility] Discharge Instructions  RECOMMENDATIONS MADE BY THE CONSULTANT AND ANY TEST RESULTS WILL BE SENT TO YOUR REFERRING PHYSICIAN.   Exam and discussion by Dr Whitney Muse today Referral to [facility] long radiation (Dr Pablo Ledger), they will call you with this appt Return to see the doctor in 3 months Please call the clinic if you have any questions or concerns     Thank you for choosing [place] at [facility] to provide your oncology and hematology care.  To afford each patient quality time with our provider, please arrive at least 15 minutes before your scheduled appointment time.   Beginning January 23rd 2017 lab work for the [facility] will be done in the  Main lab at [facility] on 1st floor. If you have a lab appointment with the [place] please come in thru the  Main Entrance and check in at the main information desk  You need to re-schedule your appointment should you arrive 10 or more minutes late.  We strive to give you quality time with our providers, and arriving late affects you and other patients whose appointments are after yours.  Also, if you no show three or more times for appointments you may be dismissed from the clinic at the providers discretion.     Again, [facility] you [facility].  Our hope is that these requests will decrease the amount of time that you wait before being seen by our physicians.       _____________________________________________________________  Should you have questions after your visit to [facility], please contact our office at [phone] between the hours of 8:30 a.m. and 4:30 p.m.  Voicemails left after 4:30 p.m. will not be returned until the following business day.  For prescription refill requests, have your pharmacy contact our office.         Resources For Cancer Patients and their Caregivers ? American Cancer Society: Can  assist with transportation, wigs, general needs, runs Look Good Feel Better.        331-122-5053 ? Cancer Care: Provides financial assistance, online support groups, medication/co-pay assistance.  1-800-813-HOPE (801)162-2353) ? [place] Assists [place] cancer patients and their families through emotional , educational and financial support.  825-130-6146 ? [place] DSS Where to apply for food stamps, Medicaid and utility assistance. (912)640-1916 ? [facility]: Transportation to medical appointments. (715)447-7298 ? Social Security Administration: May apply for disability if have a Stage IV cancer. 916-681-9678 (818)482-5183 ? [employer], Disability and Transit Services: Assists with nutrition, care and transit needs. 512-186-4399

## 2016-03-09 ENCOUNTER — Encounter: Payer: Self-pay | Admitting: Radiation Oncology

## 2016-03-09 ENCOUNTER — Ambulatory Visit (HOSPITAL_COMMUNITY): Payer: Medicare Other

## 2016-03-09 DIAGNOSIS — Z85828 Personal history of other malignant neoplasm of skin: Secondary | ICD-10-CM | POA: Diagnosis not present

## 2016-03-09 DIAGNOSIS — R300 Dysuria: Secondary | ICD-10-CM | POA: Diagnosis not present

## 2016-03-09 DIAGNOSIS — N3000 Acute cystitis without hematuria: Secondary | ICD-10-CM | POA: Diagnosis not present

## 2016-03-09 DIAGNOSIS — L570 Actinic keratosis: Secondary | ICD-10-CM | POA: Diagnosis not present

## 2016-03-11 NOTE — Progress Notes (Addendum)
LymphomaLymphoma Location(s) / Histology: Gastric MALT Lymphoma  MACK THURMON presented with a lymphoid population of stomach with slight atypia.   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 - BACKGROUND CHRONIC ACTIVE GASTRITIS. - NEGATIVE FOR HELICOBACTER PYLORI. - SEE ONCOLOGY TABLE.  Past/Anticipated interventions by medical oncology, if TKW:IOXBDZHGD referral  Weight changes, if any, over the past 6 months:  Wt Readings from Last 3 Encounters:  03/05/16 173 lb (78.472 kg)  03/01/16 174 lb 9.6 oz (79.198 kg)  02/03/16 172 lb (78.019 kg)    Recurrent fevers, or drenching night sweats, if any: No SAFETY ISSUES:  Prior radiation? :No  Pacemaker/ICD?:No  Possible current pregnancy? :No  Is the patient on methotrexate? :No  Current Complaints / other details:Here to discuss radiation treatment plan BP 146/76 mmHg  Pulse 96  Temp(Src) 97.9 F (36.6 C) (Oral)  Resp 18  Ht '5\' 6"'$  (1.676 m)  Wt 173 lb 4.8 oz (78.608 kg)  BMI 27.98 kg/m2  SpO2 100%

## 2016-03-12 ENCOUNTER — Encounter (HOSPITAL_COMMUNITY): Payer: Self-pay | Admitting: Hematology & Oncology

## 2016-03-12 ENCOUNTER — Ambulatory Visit
Admission: RE | Admit: 2016-03-12 | Discharge: 2016-03-12 | Disposition: A | Discharge status: Home / Self Care | Payer: Medicare Other | Source: Ambulatory Visit | Attending: Radiation Oncology | Admitting: Radiation Oncology

## 2016-03-12 ENCOUNTER — Encounter: Payer: Self-pay | Admitting: Radiation Oncology

## 2016-03-12 VITALS — BP 146/76 | HR 96 | Temp 97.9°F | Resp 18 | Ht 66.0 in | Wt 173.3 lb

## 2016-03-12 DIAGNOSIS — Z510 Encounter for antineoplastic radiation therapy: Secondary | ICD-10-CM | POA: Insufficient documentation

## 2016-03-12 DIAGNOSIS — C8583 Other specified types of non-Hodgkin lymphoma, intra-abdominal lymph nodes: Secondary | ICD-10-CM | POA: Insufficient documentation

## 2016-03-12 DIAGNOSIS — C884 Extranodal marginal zone b-cell lymphoma of mucosa-associated lymphoid tissue (malt-lymphoma) not having achieved remission: Secondary | ICD-10-CM | POA: Insufficient documentation

## 2016-03-12 DIAGNOSIS — R1310 Dysphagia, unspecified: Secondary | ICD-10-CM | POA: Diagnosis not present

## 2016-03-12 NOTE — Addendum Note (Signed)
Encounter addended by: Malena Edman, RN on: 03/12/2016  5:16 PM<BR>     Documentation filed: Charges VN

## 2016-03-12 NOTE — Progress Notes (Signed)
  Radiation Oncology         [phone]) [phone] ________________________________  Initial outpatient Consultation - Date: 03/12/2016   Name: Monica [name] MRN: [id number]   DOB: Dec 29, 1940  REFERRING PHYSICIAN: Patrici Ranks, MD  DIAGNOSIS AND STAGE: Stage I Gastric MALT lymphoma  HISTORY OF PRESENT ILLNESS::Monica [name] is a 75 y.o. female  With a history of dysphagia.  She underwent endoscopy in May with biopsy showed a lymphoid population with slight atypia.  She underwent repeat endoscopy on 03/09/16 which showed marginal zone lymphoma of mucosal associated lymphoid tissue.This was negative for H pylori and her stool test was also negative.  She states she was on about 5 antibiotics from December to February. . She has some acid reflux for which she takes protonix.  She saw Dr. Whitney Muse who referred her to me for consideration of radiation to her newly diagnosed MALT lymphoma.  She is accompanied by her good friend. She has no GI symptoms other than dysphagia. She denies nausea or weight loss. She denies fevers or night sweats.   PREVIOUS RADIATION THERAPY: No  Past medical, social and family history were reviewed in the electronic chart. Review of symptoms was reviewed in the electronic chart. Medications were reviewed in the electronic chart.   PHYSICAL EXAM:  Filed Vitals:   03/12/16 1555  BP: 146/76  Pulse: 96  Temp: 97.9 F (36.6 C)  Resp: 18  .173 lb 4.8 oz (78.608 kg). Pleasant females. No distress. Alert and oriented x 3.   IMPRESSION: Stage IE MALT lymphoma of the stomach H. Pylori negative.   PLAN:We discussed the use of radiation in the treatment of MALT lymphomas. We discussed alternatives to treatment including chemotherapy and antibiotics.  We discussed 15 treatments as an outpatient. We discussed CT simulation and the placement of tattoos.  We discussed fatigue and nausea as the most common although not the only side effects.  We discussed long term effects including  but not limited to stomach damage, bowel damage, heart damage which are all rare. We discussed the consent form and she signed informed consent. We will call her with a simulation appointment for next week.   We will start her next week so that she can get to her beach trip on 6/10 on time.     I spent 40 minutes  face to face with the patient and more than 50% of that time was spent in counseling and/or coordination of care.   ------------------------------------------------  Thea Silversmith, MD

## 2016-03-16 ENCOUNTER — Ambulatory Visit
Admission: RE | Admit: 2016-03-16 | Discharge: 2016-03-16 | Disposition: A | Discharge status: Home / Self Care | Payer: Medicare Other | Source: Ambulatory Visit | Attending: Radiation Oncology | Admitting: Radiation Oncology

## 2016-03-16 DIAGNOSIS — R1310 Dysphagia, unspecified: Secondary | ICD-10-CM | POA: Diagnosis not present

## 2016-03-16 DIAGNOSIS — Z510 Encounter for antineoplastic radiation therapy: Secondary | ICD-10-CM | POA: Diagnosis not present

## 2016-03-16 DIAGNOSIS — C884 Extranodal marginal zone B-cell lymphoma of mucosa-associated lymphoid tissue [MALT-lymphoma]: Secondary | ICD-10-CM

## 2016-03-16 DIAGNOSIS — C8583 Other specified types of non-Hodgkin lymphoma, intra-abdominal lymph nodes: Secondary | ICD-10-CM | POA: Diagnosis not present

## 2016-03-16 NOTE — Progress Notes (Signed)
Name: Monica [name]   MRN: [id number]  Date:  03/16/2016  DOB: 06-Apr-1941   DIAGNOSIS: Stage I Gastric MALT lymphoma   CONSENT VERIFIED: yes  SET UP: Patient is setup supine  IMMOBILIZATION:  The following immobilization was used: Alpha cradle  NARRATIVE:  Pt [name] was brought to the CT Simulation planning suite.  Identity was confirmed.  All relevant records and images related to the planned course of therapy were reviewed.  Then, the patient was positioned in a stable reproducible clinical set-up for radiation therapy.  CT images were obtained.  An isocenter was placed. Skin markings were placed.  The CT images were loaded into the planning software where the target and avoidance structures were contoured.  The radiation prescription was entered and confirmed. The patient was discharged in stable condition and tolerated simulation well.   TREATMENT PLANNING NOTE:  Treatment planning then occurred.  I have requested : MLC's, isodose plan, basic dose calculation  I have requested 3 dimensional simulation with DVH of cord, kidneys, bowel and GTV  A total of 4 complex treatment devices will be used in the form of unique MLCS

## 2016-03-17 DIAGNOSIS — C884 Extranodal marginal zone B-cell lymphoma of mucosa-associated lymphoid tissue [MALT-lymphoma]: Secondary | ICD-10-CM | POA: Diagnosis not present

## 2016-03-17 DIAGNOSIS — C8583 Other specified types of non-Hodgkin lymphoma, intra-abdominal lymph nodes: Secondary | ICD-10-CM | POA: Diagnosis not present

## 2016-03-17 DIAGNOSIS — R1310 Dysphagia, unspecified: Secondary | ICD-10-CM | POA: Diagnosis not present

## 2016-03-17 DIAGNOSIS — Z510 Encounter for antineoplastic radiation therapy: Secondary | ICD-10-CM | POA: Diagnosis not present

## 2016-03-18 ENCOUNTER — Ambulatory Visit
Admission: RE | Admit: 2016-03-18 | Discharge: 2016-03-18 | Disposition: A | Discharge status: Home / Self Care | Payer: Medicare Other | Source: Ambulatory Visit | Attending: Radiation Oncology | Admitting: Radiation Oncology

## 2016-03-18 ENCOUNTER — Ambulatory Visit: Payer: Medicare Other

## 2016-03-18 DIAGNOSIS — R1310 Dysphagia, unspecified: Secondary | ICD-10-CM | POA: Diagnosis not present

## 2016-03-18 DIAGNOSIS — C8583 Other specified types of non-Hodgkin lymphoma, intra-abdominal lymph nodes: Secondary | ICD-10-CM | POA: Diagnosis not present

## 2016-03-18 DIAGNOSIS — C884 Extranodal marginal zone B-cell lymphoma of mucosa-associated lymphoid tissue [MALT-lymphoma]: Secondary | ICD-10-CM | POA: Diagnosis not present

## 2016-03-18 DIAGNOSIS — Z510 Encounter for antineoplastic radiation therapy: Secondary | ICD-10-CM | POA: Diagnosis not present

## 2016-03-19 ENCOUNTER — Ambulatory Visit: Payer: Medicare Other

## 2016-03-19 ENCOUNTER — Ambulatory Visit
Admission: RE | Admit: 2016-03-19 | Discharge: 2016-03-19 | Disposition: A | Discharge status: Home / Self Care | Payer: Medicare Other | Source: Ambulatory Visit | Attending: Radiation Oncology | Admitting: Radiation Oncology

## 2016-03-19 DIAGNOSIS — C8583 Other specified types of non-Hodgkin lymphoma, intra-abdominal lymph nodes: Secondary | ICD-10-CM | POA: Diagnosis not present

## 2016-03-19 DIAGNOSIS — Z510 Encounter for antineoplastic radiation therapy: Secondary | ICD-10-CM | POA: Diagnosis not present

## 2016-03-19 DIAGNOSIS — C884 Extranodal marginal zone B-cell lymphoma of mucosa-associated lymphoid tissue [MALT-lymphoma]: Secondary | ICD-10-CM | POA: Diagnosis not present

## 2016-03-19 DIAGNOSIS — R1310 Dysphagia, unspecified: Secondary | ICD-10-CM | POA: Diagnosis not present

## 2016-03-22 ENCOUNTER — Other Ambulatory Visit: Payer: Self-pay | Admitting: Radiation Oncology

## 2016-03-22 ENCOUNTER — Ambulatory Visit: Payer: Medicare Other

## 2016-03-22 ENCOUNTER — Ambulatory Visit
Admission: RE | Admit: 2016-03-22 | Discharge: 2016-03-22 | Disposition: A | Discharge status: Home / Self Care | Payer: Medicare Other | Source: Ambulatory Visit | Attending: Radiation Oncology | Admitting: Radiation Oncology

## 2016-03-22 DIAGNOSIS — C884 Extranodal marginal zone b-cell lymphoma of mucosa-associated lymphoid tissue (malt-lymphoma) not having achieved remission: Secondary | ICD-10-CM

## 2016-03-22 DIAGNOSIS — Z510 Encounter for antineoplastic radiation therapy: Secondary | ICD-10-CM | POA: Diagnosis not present

## 2016-03-22 DIAGNOSIS — R1310 Dysphagia, unspecified: Secondary | ICD-10-CM | POA: Diagnosis not present

## 2016-03-22 DIAGNOSIS — C8583 Other specified types of non-Hodgkin lymphoma, intra-abdominal lymph nodes: Secondary | ICD-10-CM | POA: Diagnosis not present

## 2016-03-22 MED ORDER — LORAZEPAM 0.5 MG PO TABS
0.5000 mg | ORAL_TABLET | Freq: Once | ORAL | Status: AC
  Administered 2016-03-22: 0.5 mg via ORAL
  Filled 2016-03-22: qty 1

## 2016-03-22 MED ORDER — PROMETHAZINE HCL 25 MG PO TABS: 25.0000 mg | ORAL_TABLET | Freq: Four times a day (QID) | ORAL | Status: DC | PRN

## 2016-03-22 NOTE — Progress Notes (Addendum)
Rx for Phenergan made today due to Tonie Byrant's report that patient is struggling with nausea.  I called to let the patient now Rx was made. -----------------------------------  Eppie Gibson, MD

## 2016-03-23 ENCOUNTER — Ambulatory Visit
Admission: RE | Admit: 2016-03-23 | Discharge: 2016-03-23 | Disposition: A | Discharge status: Home / Self Care | Payer: Medicare Other | Source: Ambulatory Visit | Attending: Radiation Oncology | Admitting: Radiation Oncology

## 2016-03-23 ENCOUNTER — Ambulatory Visit: Payer: Medicare Other

## 2016-03-23 ENCOUNTER — Encounter: Payer: Self-pay | Admitting: Radiation Oncology

## 2016-03-23 VITALS — BP 143/66 | HR 81 | Temp 97.7°F | Resp 20 | Wt 169.9 lb

## 2016-03-23 DIAGNOSIS — R1310 Dysphagia, unspecified: Secondary | ICD-10-CM | POA: Diagnosis not present

## 2016-03-23 DIAGNOSIS — C8583 Other specified types of non-Hodgkin lymphoma, intra-abdominal lymph nodes: Secondary | ICD-10-CM | POA: Diagnosis not present

## 2016-03-23 DIAGNOSIS — Z510 Encounter for antineoplastic radiation therapy: Secondary | ICD-10-CM | POA: Diagnosis not present

## 2016-03-23 DIAGNOSIS — C884 Extranodal marginal zone B-cell lymphoma of mucosa-associated lymphoid tissue [MALT-lymphoma]: Secondary | ICD-10-CM

## 2016-03-23 MED ORDER — LORAZEPAM 0.5 MG PO TABS: 0.5000 mg | ORAL_TABLET | Freq: Two times a day (BID) | ORAL | Status: DC | PRN

## 2016-03-23 MED ORDER — RADIAPLEXRX EX GEL
Freq: Once | CUTANEOUS | Status: AC
  Administered 2016-03-23: 13:00:00 via TOPICAL

## 2016-03-23 NOTE — Progress Notes (Addendum)
Weekly rad txs  Abdomen 4/15 completed, has had terrible nausea, phenergan rx  given  Yesterday by Dr. Isidore Moos,  Says ativan helped better than the phenergan, abdominal discomfort, bowels moving good, retsated on another rx for UTI yesterday ,  Energy level is so so,pt education to be  Done after MD visit 1:07 PM BP 143/66 mmHg  Pulse 81  Temp(Src) 97.7 F (36.5 C) (Oral)  Resp 20  Wt 169 lb 14.4 oz (77.066 kg)  Wt Readings from Last 3 Encounters:  03/23/16 169 lb 14.4 oz (77.066 kg)  03/12/16 173 lb 4.8 oz (78.608 kg)  03/05/16 173 lb (78.472 kg)  Pt education done, radiatio therapy and you book, radiaplex gel tube given, [employer] card given as well, discussed ways to manage side effects, fatigue,  nausea, vomiting, diarrhea, bladder  Changes, skin irritation,pain, may need to eat 5-6 smaller meals  And snacks in between, for diarrhea low fiber diet, imodium prn, stay hydrated, drink plenty water,fluids,  Nausea medication given , sees MD weekly and prn;, verbal understanding, teach back given

## 2016-03-23 NOTE — Progress Notes (Signed)
Weekly Management Note Current Dose: 8 Gy  Projected Dose: 30 Gy  Narrative:  The patient presents for routine under treatment assessment.  CBCT/MVCT images/Port film x-rays were reviewed.  The chart was checked. She has had nausea since starting radiation last week. SL lorazepam was effective. Phenergan was not effective.  Her friend is driving her. She has recently restarted antibiotics for a UTI.   Physical Findings:  Unchanged  Vitals:  Filed Vitals:   03/23/16 1301  BP: 143/66  Pulse: 81  Temp: 97.7 F (36.5 C)  Resp: 20   Weight:  Wt Readings from Last 3 Encounters:  03/23/16 169 lb 14.4 oz (77.066 kg)  03/12/16 173 lb 4.8 oz (78.608 kg)  03/05/16 173 lb (78.472 kg)   Lab Results  Component Value Date   WBC 7.3 03/01/2016   HGB 11.9* 03/01/2016   HCT 37.3 03/01/2016   MCV 83.3 03/01/2016   PLT 278 03/01/2016   Lab Results  Component Value Date   CREATININE 0.66 03/01/2016   BUN 14 03/01/2016   NA 137 03/01/2016   K 3.8 03/01/2016   CL 104 03/01/2016   CO2 24 03/01/2016     Impression:  The patient is tolerating radiation.  Plan:  Continue treatment as planned. Gave script for lorazepam to use prn. RN education performed.     ------------------------------------------------  Thea Silversmith, MD    This document serves as a record of services personally performed by Thea Silversmith, MD. It was created on her behalf by  Lendon Collar, a trained medical scribe. The creation of this record is based on the scribe's personal observations and the provider's statements to them. This document has been checked and approved by the attending provider. Yup

## 2016-03-24 ENCOUNTER — Ambulatory Visit
Admission: RE | Admit: 2016-03-24 | Discharge: 2016-03-24 | Disposition: A | Discharge status: Home / Self Care | Payer: Medicare Other | Source: Ambulatory Visit | Attending: Radiation Oncology | Admitting: Radiation Oncology

## 2016-03-24 ENCOUNTER — Ambulatory Visit: Payer: Medicare Other

## 2016-03-24 ENCOUNTER — Ambulatory Visit: Payer: Medicare Other | Admitting: Radiation Oncology

## 2016-03-24 DIAGNOSIS — R1310 Dysphagia, unspecified: Secondary | ICD-10-CM | POA: Diagnosis not present

## 2016-03-24 DIAGNOSIS — Z510 Encounter for antineoplastic radiation therapy: Secondary | ICD-10-CM | POA: Diagnosis not present

## 2016-03-24 DIAGNOSIS — C8583 Other specified types of non-Hodgkin lymphoma, intra-abdominal lymph nodes: Secondary | ICD-10-CM | POA: Diagnosis not present

## 2016-03-24 DIAGNOSIS — C884 Extranodal marginal zone B-cell lymphoma of mucosa-associated lymphoid tissue [MALT-lymphoma]: Secondary | ICD-10-CM | POA: Diagnosis not present

## 2016-03-25 ENCOUNTER — Ambulatory Visit
Admission: RE | Admit: 2016-03-25 | Discharge: 2016-03-25 | Disposition: A | Discharge status: Home / Self Care | Payer: Medicare Other | Source: Ambulatory Visit | Attending: Radiation Oncology | Admitting: Radiation Oncology

## 2016-03-25 ENCOUNTER — Ambulatory Visit: Payer: Medicare Other

## 2016-03-25 DIAGNOSIS — C8583 Other specified types of non-Hodgkin lymphoma, intra-abdominal lymph nodes: Secondary | ICD-10-CM | POA: Diagnosis not present

## 2016-03-25 DIAGNOSIS — R1310 Dysphagia, unspecified: Secondary | ICD-10-CM | POA: Diagnosis not present

## 2016-03-25 DIAGNOSIS — Z510 Encounter for antineoplastic radiation therapy: Secondary | ICD-10-CM | POA: Diagnosis not present

## 2016-03-25 DIAGNOSIS — C884 Extranodal marginal zone B-cell lymphoma of mucosa-associated lymphoid tissue [MALT-lymphoma]: Secondary | ICD-10-CM | POA: Diagnosis not present

## 2016-03-26 ENCOUNTER — Ambulatory Visit
Admission: RE | Admit: 2016-03-26 | Discharge: 2016-03-26 | Disposition: A | Discharge status: Home / Self Care | Payer: Medicare Other | Source: Ambulatory Visit | Attending: Radiation Oncology | Admitting: Radiation Oncology

## 2016-03-26 ENCOUNTER — Ambulatory Visit: Payer: Medicare Other

## 2016-03-26 DIAGNOSIS — C8583 Other specified types of non-Hodgkin lymphoma, intra-abdominal lymph nodes: Secondary | ICD-10-CM | POA: Diagnosis not present

## 2016-03-26 DIAGNOSIS — R1310 Dysphagia, unspecified: Secondary | ICD-10-CM | POA: Diagnosis not present

## 2016-03-26 DIAGNOSIS — C884 Extranodal marginal zone B-cell lymphoma of mucosa-associated lymphoid tissue [MALT-lymphoma]: Secondary | ICD-10-CM | POA: Diagnosis not present

## 2016-03-26 DIAGNOSIS — Z510 Encounter for antineoplastic radiation therapy: Secondary | ICD-10-CM | POA: Diagnosis not present

## 2016-03-30 ENCOUNTER — Ambulatory Visit: Payer: Medicare Other

## 2016-03-30 ENCOUNTER — Ambulatory Visit
Admission: RE | Admit: 2016-03-30 | Discharge: 2016-03-30 | Disposition: A | Discharge status: Home / Self Care | Payer: Medicare Other | Source: Ambulatory Visit | Attending: Radiation Oncology | Admitting: Radiation Oncology

## 2016-03-30 DIAGNOSIS — C884 Extranodal marginal zone B-cell lymphoma of mucosa-associated lymphoid tissue [MALT-lymphoma]: Secondary | ICD-10-CM

## 2016-03-30 DIAGNOSIS — R1310 Dysphagia, unspecified: Secondary | ICD-10-CM | POA: Diagnosis not present

## 2016-03-30 DIAGNOSIS — Z510 Encounter for antineoplastic radiation therapy: Secondary | ICD-10-CM | POA: Diagnosis not present

## 2016-03-30 DIAGNOSIS — C8583 Other specified types of non-Hodgkin lymphoma, intra-abdominal lymph nodes: Secondary | ICD-10-CM | POA: Diagnosis not present

## 2016-03-30 NOTE — Progress Notes (Signed)
Weekly Management Note Current Dose: 16 Gy  Projected Dose: 30 Gy  Narrative:  The patient presents for routine under treatment assessment.  CBCT/MVCT images/Port film x-rays were reviewed.  The chart was checked.  She still has nausea for which she takes Ativan.  Physical Findings:  Skin unchanged  Vitals:  There were no vitals filed for this visit. Weight:  Wt Readings from Last 3 Encounters:  03/23/16 169 lb 14.4 oz (77.066 kg)  03/12/16 173 lb 4.8 oz (78.608 kg)  03/05/16 173 lb (78.472 kg)   Lab Results  Component Value Date   WBC 7.3 03/01/2016   HGB 11.9* 03/01/2016   HCT 37.3 03/01/2016   MCV 83.3 03/01/2016   PLT 278 03/01/2016   Lab Results  Component Value Date   CREATININE 0.66 03/01/2016   BUN 14 03/01/2016   NA 137 03/01/2016   K 3.8 03/01/2016   CL 104 03/01/2016   CO2 24 03/01/2016     Impression:  The patient is tolerating radiation.  Plan:  Continue treatment as planned. I reviewed her PET scan and endoscopy images with her husband. We also talked about smoking cessation.  ------------------------------------------------  Thea Silversmith, MD  This document serves as a record of services personally performed by Thea Silversmith, MD. It was created on her behalf by Darcus Austin, a trained medical scribe. The creation of this record is based on the scribe's personal observations and the provider's statements to them. This document has been checked and approved by the attending provider.

## 2016-03-31 ENCOUNTER — Ambulatory Visit: Payer: Medicare Other

## 2016-03-31 ENCOUNTER — Ambulatory Visit
Admission: RE | Admit: 2016-03-31 | Discharge: 2016-03-31 | Disposition: A | Discharge status: Home / Self Care | Payer: Medicare Other | Source: Ambulatory Visit | Attending: Radiation Oncology | Admitting: Radiation Oncology

## 2016-03-31 DIAGNOSIS — C884 Extranodal marginal zone B-cell lymphoma of mucosa-associated lymphoid tissue [MALT-lymphoma]: Secondary | ICD-10-CM | POA: Diagnosis not present

## 2016-03-31 DIAGNOSIS — Z510 Encounter for antineoplastic radiation therapy: Secondary | ICD-10-CM | POA: Diagnosis not present

## 2016-03-31 DIAGNOSIS — C8583 Other specified types of non-Hodgkin lymphoma, intra-abdominal lymph nodes: Secondary | ICD-10-CM | POA: Diagnosis not present

## 2016-03-31 DIAGNOSIS — R1310 Dysphagia, unspecified: Secondary | ICD-10-CM | POA: Diagnosis not present

## 2016-04-01 ENCOUNTER — Telehealth: Payer: Self-pay | Admitting: Internal Medicine

## 2016-04-01 ENCOUNTER — Ambulatory Visit
Admission: RE | Admit: 2016-04-01 | Discharge: 2016-04-01 | Disposition: A | Discharge status: Home / Self Care | Payer: Medicare Other | Source: Ambulatory Visit | Attending: Radiation Oncology | Admitting: Radiation Oncology

## 2016-04-01 ENCOUNTER — Ambulatory Visit: Payer: Medicare Other

## 2016-04-01 DIAGNOSIS — R1310 Dysphagia, unspecified: Secondary | ICD-10-CM | POA: Diagnosis not present

## 2016-04-01 DIAGNOSIS — C8583 Other specified types of non-Hodgkin lymphoma, intra-abdominal lymph nodes: Secondary | ICD-10-CM | POA: Diagnosis not present

## 2016-04-01 DIAGNOSIS — C884 Extranodal marginal zone B-cell lymphoma of mucosa-associated lymphoid tissue [MALT-lymphoma]: Secondary | ICD-10-CM | POA: Diagnosis not present

## 2016-04-01 DIAGNOSIS — Z510 Encounter for antineoplastic radiation therapy: Secondary | ICD-10-CM | POA: Diagnosis not present

## 2016-04-01 NOTE — Telephone Encounter (Signed)
Routing to Susan. 

## 2016-04-01 NOTE — Telephone Encounter (Signed)
Pt called to cancel OV because she still has 6 more radiation treatments and wanted to wait to see when RMR wanted her to follow up. Please advise. I offered to put her on the recall list for 2 months out, but she wants to wait for RMR to decide.

## 2016-04-01 NOTE — Telephone Encounter (Signed)
2 months

## 2016-04-01 NOTE — Telephone Encounter (Signed)
Routing to RMR- when would you like for the pt to follow up with [place]?

## 2016-04-01 NOTE — Telephone Encounter (Signed)
Reminder in epic °

## 2016-04-02 ENCOUNTER — Ambulatory Visit
Admission: RE | Admit: 2016-04-02 | Discharge: 2016-04-02 | Disposition: A | Discharge status: Home / Self Care | Payer: Medicare Other | Source: Ambulatory Visit | Attending: Radiation Oncology | Admitting: Radiation Oncology

## 2016-04-02 ENCOUNTER — Ambulatory Visit: Payer: Medicare Other

## 2016-04-02 DIAGNOSIS — Z510 Encounter for antineoplastic radiation therapy: Secondary | ICD-10-CM | POA: Diagnosis not present

## 2016-04-02 DIAGNOSIS — C884 Extranodal marginal zone B-cell lymphoma of mucosa-associated lymphoid tissue [MALT-lymphoma]: Secondary | ICD-10-CM | POA: Diagnosis not present

## 2016-04-02 DIAGNOSIS — C8583 Other specified types of non-Hodgkin lymphoma, intra-abdominal lymph nodes: Secondary | ICD-10-CM | POA: Diagnosis not present

## 2016-04-02 DIAGNOSIS — R1310 Dysphagia, unspecified: Secondary | ICD-10-CM | POA: Diagnosis not present

## 2016-04-05 ENCOUNTER — Ambulatory Visit
Admission: RE | Admit: 2016-04-05 | Discharge: 2016-04-05 | Disposition: A | Discharge status: Home / Self Care | Payer: Medicare Other | Source: Ambulatory Visit | Attending: Radiation Oncology | Admitting: Radiation Oncology

## 2016-04-05 ENCOUNTER — Ambulatory Visit: Payer: Medicare Other

## 2016-04-05 ENCOUNTER — Encounter: Payer: Self-pay | Admitting: Radiation Oncology

## 2016-04-05 ENCOUNTER — Telehealth: Payer: Self-pay | Admitting: *Deleted

## 2016-04-05 VITALS — BP 131/64 | HR 78 | Temp 97.4°F | Ht 66.0 in | Wt 166.8 lb

## 2016-04-05 DIAGNOSIS — C8583 Other specified types of non-Hodgkin lymphoma, intra-abdominal lymph nodes: Secondary | ICD-10-CM | POA: Diagnosis not present

## 2016-04-05 DIAGNOSIS — R1310 Dysphagia, unspecified: Secondary | ICD-10-CM | POA: Diagnosis not present

## 2016-04-05 DIAGNOSIS — Z510 Encounter for antineoplastic radiation therapy: Secondary | ICD-10-CM | POA: Diagnosis not present

## 2016-04-05 DIAGNOSIS — C884 Extranodal marginal zone B-cell lymphoma of mucosa-associated lymphoid tissue [MALT-lymphoma]: Secondary | ICD-10-CM

## 2016-04-05 NOTE — Progress Notes (Signed)
Monica [name] is here for her 12th fraction of radiation to her Abdomen. She reports trouble swallowing pills which has worsened since her last visit with Dr. Pablo Ledger. She states her food gets stuck. She has tried chewing her food into smaller pieces, but it has not worked. She was not able to swallow pills today because of feeling choked. She does not have pain, just that pills and food "can only get so far, and that is as far as it gets". She is able to drink liquids if she takes smaller sips. She has had a lot of "burping" She also has some nausea related to her difficulty swallowing, which is helped with her ativan prescription. She does have some fatigue, and will rest during the day. She is having good bowel movements, and denies constipation.   BP 131/64 mmHg  Pulse 78  Temp(Src) 97.4 F (36.3 C)  Ht '5\' 6"'$  (1.676 m)  Wt 166 lb 12.8 oz (75.66 kg)  BMI 26.94 kg/m2  SpO2 98%   Wt Readings from Last 3 Encounters:  04/05/16 166 lb 12.8 oz (75.66 kg)  03/23/16 169 lb 14.4 oz (77.066 kg)  03/12/16 173 lb 4.8 oz (78.608 kg)

## 2016-04-05 NOTE — Telephone Encounter (Signed)
CALLED PATIENT TO INFORM OF NUTRITION APPT. ON 04-06-16 @ 1:45 PM WITH BARBARA NEFF, LVM FOR A RETURN CALL

## 2016-04-05 NOTE — Progress Notes (Signed)
Weekly Management Note Current Dose: 24 Gy  Projected Dose: 30 Gy  Narrative:  The patient presents for routine under treatment assessment.  CBCT/MVCT images/Port film x-rays were reviewed.  The chart was checked.She reports trouble swallowing pills which has worsened since her last visit with Dr. Pablo Ledger. She states her food gets stuck. She has tried chewing her food into smaller pieces, but it has not worked. She was not able to swallow pills today because of feeling choked. She does not have pain, just that pills and food "can only get so far, and that is as far as it gets". She is able to drink liquids if she takes smaller sips. She has had a lot of "burping" She also has some nausea related to her difficulty swallowing, which is helped with her ativan prescription. She does have some fatigue, and will rest during the day. She is having good bowel movements, and denies constipation.   She reports that months ago Dr Gala Romney dilated her esophagus for similar dysphagia.  She also has a hiatal hernia.  She still has nausea for which she takes Ativan. Denies lightheadedness.  Tolerates boost and liquids.  Physical Findings:  NAD  Vitals:  Filed Vitals:   04/05/16 1136  BP: 131/64  Pulse: 78  Temp: 97.4 F (36.3 C)   Weight:  Wt Readings from Last 3 Encounters:  04/05/16 166 lb 12.8 oz (75.66 kg)  03/23/16 169 lb 14.4 oz (77.066 kg)  03/12/16 173 lb 4.8 oz (78.608 kg)   Lab Results  Component Value Date   WBC 7.3 03/01/2016   HGB 11.9* 03/01/2016   HCT 37.3 03/01/2016   MCV 83.3 03/01/2016   PLT 278 03/01/2016   Lab Results  Component Value Date   CREATININE 0.66 03/01/2016   BUN 14 03/01/2016   NA 137 03/01/2016   K 3.8 03/01/2016   CL 104 03/01/2016   CO2 24 03/01/2016     Impression:  The patient is tolerating radiation.  Plan:  Continue treatment as planned.  Dysphagia is likely related to her prior issues as above, possibly somewhat exacerbated by stomach RT but not  likely to be caused by it.  I asked Romie Jumper to schedule followup in [facility] ASAP.  Pt is to discuss her meds with her pharmacist - which can be crushed, which can be given in liquid Rx's?.  Use pudding or applesauce to facilitate swallowing pills.  Referral urgently to nutrition for weight loss  F/u with Dr. Pablo Ledger after pt's 2 week vacation which will follow her last radiotherapy treatment.  -----------------------------------  Eppie Gibson, MD

## 2016-04-06 ENCOUNTER — Ambulatory Visit
Admission: RE | Admit: 2016-04-06 | Discharge: 2016-04-06 | Disposition: A | Discharge status: Home / Self Care | Payer: Medicare Other | Source: Ambulatory Visit | Attending: Radiation Oncology | Admitting: Radiation Oncology

## 2016-04-06 ENCOUNTER — Ambulatory Visit: Payer: Medicare Other | Admitting: Gastroenterology

## 2016-04-06 ENCOUNTER — Telehealth: Payer: Self-pay | Admitting: Nutrition

## 2016-04-06 ENCOUNTER — Encounter: Payer: Self-pay | Admitting: Gastroenterology

## 2016-04-06 ENCOUNTER — Other Ambulatory Visit: Payer: Self-pay

## 2016-04-06 ENCOUNTER — Encounter: Payer: Medicare Other | Admitting: Nutrition

## 2016-04-06 ENCOUNTER — Ambulatory Visit: Payer: Medicare Other

## 2016-04-06 ENCOUNTER — Encounter: Payer: Self-pay | Admitting: Nutrition

## 2016-04-06 ENCOUNTER — Ambulatory Visit (INDEPENDENT_AMBULATORY_CARE_PROVIDER_SITE_OTHER): Payer: Medicare Other | Admitting: Gastroenterology

## 2016-04-06 VITALS — BP 137/86 | HR 96 | Temp 98.1°F | Ht 66.0 in | Wt 165.4 lb

## 2016-04-06 DIAGNOSIS — R634 Abnormal weight loss: Secondary | ICD-10-CM

## 2016-04-06 DIAGNOSIS — C884 Extranodal marginal zone b-cell lymphoma of mucosa-associated lymphoid tissue (malt-lymphoma) not having achieved remission: Secondary | ICD-10-CM

## 2016-04-06 DIAGNOSIS — R1310 Dysphagia, unspecified: Secondary | ICD-10-CM | POA: Diagnosis not present

## 2016-04-06 DIAGNOSIS — R110 Nausea: Secondary | ICD-10-CM

## 2016-04-06 DIAGNOSIS — Z510 Encounter for antineoplastic radiation therapy: Secondary | ICD-10-CM | POA: Diagnosis not present

## 2016-04-06 DIAGNOSIS — C8583 Other specified types of non-Hodgkin lymphoma, intra-abdominal lymph nodes: Secondary | ICD-10-CM | POA: Diagnosis not present

## 2016-04-06 NOTE — Telephone Encounter (Signed)
Contacted patient to discuss weight loss. Patient cancelled nutrition appointment today. Left message for patient to call me if I can mail information or if she would like to reschedule.

## 2016-04-06 NOTE — Progress Notes (Signed)
Please schedule patient for EGD with dilation with Dr. Gala Romney for this Thursday afternoon. She may have clear liquids up until 6 hours before the procedure.

## 2016-04-06 NOTE — Patient Instructions (Signed)
1. We will call you about possible upper endoscopy once I discuss your problems swallowing with Dr. Gala Romney.

## 2016-04-06 NOTE — Assessment & Plan Note (Addendum)
75 year old female with history of recurrent Schatzki ring requiring multiple dilations, last one in April 2017 who presents with recurrent dysphagia. Unable to swallow anything but liquids at this time. Symptoms started about a week ago. Denies odynophagia or heartburn. 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 She has completed 12 XRT treatments with 3 more to go. Complains of nausea since second treatment. No vomiting. Given location of XRT treatments, would not suspect radiation induced  Esophagitis. Cannot exclude pill induced esophagitis or recurrent esophageal stricture/ring. Discussed with Dr. Gala Romney. Plan on upper endoscopy with esophageal dilation this week.  I have discussed the risks, alternatives, benefits with regards to but not limited to the risk of reaction to medication, bleeding, infection, perforation and the patient is agreeable to proceed. Written consent to be obtained.  Recommended plenty of liquids, utilize boost for added nutrition. Recommend she discuss with pharmacy regarding possibility of crushing any of her pills.

## 2016-04-06 NOTE — Progress Notes (Signed)
Primary Care Physician: [name], MD  Primary Gastroenterologist:  Garfield Cornea, MD   Chief Complaint  Patient presents with  . Dysphagia    HPI: Monica [name] is a 75 y.o. female here for further evaluation of Recurrent dysphagia. Patient has a history of Schatzki ring requiring dilation back in May 2016 and more recently in April 2017. After her endoscopy with dilation, she states that her swallowing markedly improved. Of note she was diagnosed with MALToma and is currently completing radiation treatments this week. She has to complete a total of 15 treatments, will be done on Thursday.  Nausea from the beginning, with second radiation treatment. Takes Zofran when necessary. 7 pound weight loss. Denies odynophagia or heartburn. For the past 1 week she has been having difficulty swallowing solid foods and her medications. Yesterday she was unable to swallow any medications. This morning she crushed her metformin. Liquids go down just fine. Anything else gets stuck, she points at the upper throat, and she has to vomit at times to relieve it. Bowel movements regular. No blood in the stool or melena. No sores in the mouth.  Leaves for the beach on Saturday, will be gone for 2 weeks.  Current Outpatient Prescriptions  Medication Sig Dispense Refill  . aspirin 81 MG tablet Take 81 mg by mouth daily.    Marland Kitchen BIOGAIA PROBIOTIC (BIOGAIA PROBIOTIC) LIQD Take by mouth daily at 8 pm.    . Calcium-Magnesium-Vitamin D (CALCIUM 500 PO) Take 500 mg by mouth daily.    . citalopram (CELEXA) 20 MG tablet Take 20 mg by mouth daily.     Marland Kitchen LORazepam (ATIVAN) 0.5 MG tablet Place 1 tablet (0.5 mg total) under the tongue 2 (two) times daily as needed (nausea). 30 tablet 0  . metFORMIN (GLUCOPHAGE-XR) 500 MG 24 hr tablet Take 500 mg by mouth daily with breakfast.     . metoprolol tartrate (LOPRESSOR) 25 MG tablet Take 25 mg by mouth daily.     . Multiple Vitamin (MULTIVITAMIN) capsule Take 1 capsule  by mouth daily.    . pantoprazole (PROTONIX) 40 MG tablet Take 1 tablet (40 mg total) by mouth daily. 90 tablet 3  . simvastatin (ZOCOR) 20 MG tablet Take 20 mg by mouth every evening.      No current facility-administered medications for this visit.    Allergies as of 04/06/2016 - Review Complete 04/06/2016  Allergen Reaction Noted  . Sulfonamide derivatives Hives    Past Medical History  Diagnosis Date  . Hiatal hernia   . DM (diabetes mellitus) ([place])   . Hyperlipidemia   . Interstitial cystitis   . Depression   . Heart palpitations   . Dysphagia   . Dysrhythmia     History of palpatations  . Sinusitis    Past Surgical History  Procedure Laterality Date  . Esophagogastroduodenoscopy      RMR: Prominant Schzgzki ring/component of peptic stricture status post dilation and disruption as described above, otherwise norma esophagus, moderate-sized hiatal hernia, antal pyloric channel, and posterier bulbar erosions, otherwise unremarkable stomach, D1 and D2 . Inflammatory findings on the stomach and duodenum will likely be related to aspirin effect. We need to rule out Helicobacter pylor  . Colonoscopy      Dr. Lindalou Hose 2009: Normal per PCP notes  . Esophagogastroduodenoscopy N/A 03/10/2015    Dr. 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 Pathology with lymphoid population of stomach, slight atypia  .  Maloney dilation N/A 03/10/2015    Procedure: Venia Minks DILATION;  Surgeon: Daneil Dolin, MD;  Location: [facility];  Service: Endoscopy;  Laterality: N/A;  . Esophagogastroduodenoscopy N/A 02/03/2016    Dr. Gala Romney: Schatzki ring noted at GE junction, dilated with 60 and then 79 French Maloney dilator. Large hiatal hernia. 6 x 7 cm nodular geographically ulcerated mucosa, biopsy c/w MALToma  . Maloney dilation N/A 02/03/2016    Procedure: Venia Minks DILATION;  Surgeon: Daneil Dolin, MD;  Location: [facility];  Service:  Endoscopy;  Laterality: N/A;   Family History  Problem Relation Age of Onset  . Colon cancer Neg Hx    Social History   Social History  . Marital Status: Married    Spouse Name: N/A  . Number of Children: N/A  . Years of Education: N/A   Social History Main Topics  . Smoking status: Current Every Day Smoker -- 0.50 packs/day    Types: Cigarettes  . Smokeless tobacco: None  . Alcohol Use: No  . Drug Use: No  . Sexual Activity: Not Asked   Other Topics Concern  . None   Social History Narrative    ROS:  General: Negative for  fever, chills, fatigue, weakness. See hpi. ENT: Negative for hoarseness,   nasal congestion. CV: Negative for chest pain, angina, palpitations, dyspnea on exertion, peripheral edema.  Respiratory: Negative for dyspnea at rest, dyspnea on exertion, cough, sputum, wheezing.  GI: See history of present illness. GU:  Negative for dysuria, hematuria, urinary incontinence, urinary frequency, nocturnal urination.  Endo: see hpi   Physical Examination:   BP 137/86 mmHg  Pulse 96  Temp(Src) 98.1 F (36.7 C)  Ht '5\' 6"'$  (1.676 m)  Wt 165 lb 6.4 oz (75.025 kg)  BMI 26.71 kg/m2  General: Well-nourished, well-developed in no acute distress.  Eyes: No icterus. Mouth: Oropharyngeal mucosa moist and pink , no lesions erythema or exudate. Lungs: Clear to auscultation bilaterally.  Heart: Regular rate and rhythm, no murmurs rubs or gallops.  Abdomen: Bowel sounds are normal, nontender, nondistended, no hepatosplenomegaly or masses, no abdominal bruits or hernia , no rebound or guarding.   Extremities: No lower extremity edema. No clubbing or deformities. Neuro: Alert and oriented x 4   Skin: Warm and dry, no jaundice.   Psych: Alert and cooperative, normal mood and affect.  Labs:  Lab Results  Component Value Date   WBC 7.3 03/01/2016   HGB 11.9* 03/01/2016   HCT 37.3 03/01/2016   MCV 83.3 03/01/2016   PLT 278 03/01/2016   Lab Results  Component  Value Date   CREATININE 0.66 03/01/2016   BUN 14 03/01/2016   NA 137 03/01/2016   K 3.8 03/01/2016   CL 104 03/01/2016   CO2 24 03/01/2016   Lab Results  Component Value Date   ALT 16 03/01/2016   AST 22 03/01/2016   ALKPHOS 68 03/01/2016   BILITOT 0.4 03/01/2016    Imaging Studies: No results found.

## 2016-04-06 NOTE — Progress Notes (Signed)
Pt is set up for EGD/ED on 04/08/16 @ 1:45 pm. I tried to call with no answer

## 2016-04-06 NOTE — Progress Notes (Signed)
Patient cancelled nutrition appointment. 

## 2016-04-06 NOTE — Progress Notes (Signed)
cc'ed to pcp °

## 2016-04-07 ENCOUNTER — Ambulatory Visit
Admission: RE | Admit: 2016-04-07 | Discharge: 2016-04-07 | Disposition: A | Discharge status: Home / Self Care | Payer: Medicare Other | Source: Ambulatory Visit | Attending: Radiation Oncology | Admitting: Radiation Oncology

## 2016-04-07 ENCOUNTER — Ambulatory Visit: Payer: Medicare Other

## 2016-04-07 DIAGNOSIS — C884 Extranodal marginal zone B-cell lymphoma of mucosa-associated lymphoid tissue [MALT-lymphoma]: Secondary | ICD-10-CM | POA: Diagnosis not present

## 2016-04-07 DIAGNOSIS — Z510 Encounter for antineoplastic radiation therapy: Secondary | ICD-10-CM | POA: Diagnosis not present

## 2016-04-07 DIAGNOSIS — R1310 Dysphagia, unspecified: Secondary | ICD-10-CM | POA: Diagnosis not present

## 2016-04-07 DIAGNOSIS — C8583 Other specified types of non-Hodgkin lymphoma, intra-abdominal lymph nodes: Secondary | ICD-10-CM | POA: Diagnosis not present

## 2016-04-07 NOTE — Progress Notes (Signed)
Pt is aware of date and time

## 2016-04-08 ENCOUNTER — Ambulatory Visit
Admission: RE | Admit: 2016-04-08 | Discharge: 2016-04-08 | Disposition: A | Discharge status: Home / Self Care | Payer: Medicare Other | Source: Ambulatory Visit | Attending: Radiation Oncology | Admitting: Radiation Oncology

## 2016-04-08 ENCOUNTER — Encounter (HOSPITAL_COMMUNITY): Payer: Self-pay | Admitting: *Deleted

## 2016-04-08 ENCOUNTER — Encounter: Payer: Self-pay | Admitting: Internal Medicine

## 2016-04-08 ENCOUNTER — Ambulatory Visit (HOSPITAL_COMMUNITY)
Admission: RE | Admit: 2016-04-08 | Discharge: 2016-04-08 | Disposition: A | Discharge status: Home / Self Care | Payer: Medicare Other | Source: Ambulatory Visit | Attending: Internal Medicine | Admitting: Internal Medicine

## 2016-04-08 ENCOUNTER — Ambulatory Visit: Payer: Medicare Other

## 2016-04-08 ENCOUNTER — Encounter (HOSPITAL_COMMUNITY)
Admission: RE | Disposition: A | Discharge status: Home / Self Care | Payer: Self-pay | Source: Ambulatory Visit | Attending: Internal Medicine

## 2016-04-08 ENCOUNTER — Encounter: Payer: Self-pay | Admitting: Radiation Oncology

## 2016-04-08 DIAGNOSIS — F329 Major depressive disorder, single episode, unspecified: Secondary | ICD-10-CM | POA: Insufficient documentation

## 2016-04-08 DIAGNOSIS — E785 Hyperlipidemia, unspecified: Secondary | ICD-10-CM | POA: Diagnosis not present

## 2016-04-08 DIAGNOSIS — R1310 Dysphagia, unspecified: Secondary | ICD-10-CM | POA: Insufficient documentation

## 2016-04-08 DIAGNOSIS — Z7982 Long term (current) use of aspirin: Secondary | ICD-10-CM | POA: Insufficient documentation

## 2016-04-08 DIAGNOSIS — E119 Type 2 diabetes mellitus without complications: Secondary | ICD-10-CM | POA: Diagnosis not present

## 2016-04-08 DIAGNOSIS — K449 Diaphragmatic hernia without obstruction or gangrene: Secondary | ICD-10-CM | POA: Insufficient documentation

## 2016-04-08 DIAGNOSIS — Z79899 Other long term (current) drug therapy: Secondary | ICD-10-CM | POA: Diagnosis not present

## 2016-04-08 DIAGNOSIS — C884 Extranodal marginal zone B-cell lymphoma of mucosa-associated lymphoid tissue [MALT-lymphoma]: Secondary | ICD-10-CM | POA: Diagnosis not present

## 2016-04-08 DIAGNOSIS — K222 Esophageal obstruction: Secondary | ICD-10-CM

## 2016-04-08 HISTORY — PX: MALONEY DILATION: SHX5535

## 2016-04-08 HISTORY — PX: ESOPHAGOGASTRODUODENOSCOPY: SHX5428

## 2016-04-08 HISTORY — DX: Malignant (primary) neoplasm, unspecified: C80.1

## 2016-04-08 LAB — GLUCOSE, CAPILLARY: GLUCOSE-CAPILLARY: 131 mg/dL — AB (ref 65–99)

## 2016-04-08 SURGERY — EGD (ESOPHAGOGASTRODUODENOSCOPY)
Anesthesia: Moderate Sedation

## 2016-04-08 MED ORDER — LIDOCAINE VISCOUS 2 % MT SOLN
OROMUCOSAL | Status: DC | PRN
  Administered 2016-04-08: 3 mL via OROMUCOSAL

## 2016-04-08 MED ORDER — MIDAZOLAM HCL 5 MG/5ML IJ SOLN
INTRAMUSCULAR | Status: AC
  Filled 2016-04-08: qty 10

## 2016-04-08 MED ORDER — MEPERIDINE HCL 100 MG/ML IJ SOLN
INTRAMUSCULAR | Status: DC | PRN
  Administered 2016-04-08: 50 mg via INTRAVENOUS

## 2016-04-08 MED ORDER — SODIUM CHLORIDE 0.9 % IV SOLN
INTRAVENOUS | Status: DC
  Administered 2016-04-08: 10:00:00 via INTRAVENOUS

## 2016-04-08 MED ORDER — MIDAZOLAM HCL 5 MG/5ML IJ SOLN
INTRAMUSCULAR | Status: DC | PRN
  Administered 2016-04-08: 1 mg via INTRAVENOUS
  Administered 2016-04-08: 2 mg via INTRAVENOUS

## 2016-04-08 MED ORDER — ONDANSETRON HCL 4 MG/2ML IJ SOLN
INTRAMUSCULAR | Status: AC
  Filled 2016-04-08: qty 2

## 2016-04-08 MED ORDER — LIDOCAINE VISCOUS 2 % MT SOLN
OROMUCOSAL | Status: AC
  Filled 2016-04-08: qty 15

## 2016-04-08 MED ORDER — ONDANSETRON HCL 4 MG/2ML IJ SOLN
INTRAMUSCULAR | Status: DC | PRN
  Administered 2016-04-08: 4 mg via INTRAVENOUS

## 2016-04-08 MED ORDER — STERILE WATER FOR IRRIGATION IR SOLN
Status: DC | PRN
  Administered 2016-04-08: 11:00:00

## 2016-04-08 MED ORDER — MEPERIDINE HCL 100 MG/ML IJ SOLN
INTRAMUSCULAR | Status: AC
  Filled 2016-04-08: qty 2

## 2016-04-08 NOTE — Discharge Instructions (Signed)
EGD Discharge instructions Please read the instructions outlined below and refer to this sheet in the next few weeks. These discharge instructions provide you with general information on caring for yourself after you leave the hospital. Your doctor may also give you specific instructions. While your treatment has been planned according to the most current medical practices available, unavoidable complications occasionally occur. If you have any problems or questions after discharge, please call your doctor. ACTIVITY  You may resume your regular activity but move at a slower pace for the next 24 hours.   Take frequent rest periods for the next 24 hours.   Walking will help expel (get rid of) the air and reduce the bloated feeling in your abdomen.   No driving for 24 hours (because of the anesthesia (medicine) used during the test).   You may shower.   Do not sign any important legal documents or operate any machinery for 24 hours (because of the anesthesia used during the test).  NUTRITION  Drink plenty of fluids.   You may resume your normal diet.   Begin with a light meal and progress to your normal diet.   Avoid alcoholic beverages for 24 hours or as instructed by your caregiver.  MEDICATIONS  You may resume your normal medications unless your caregiver tells you otherwise.  WHAT YOU CAN EXPECT TODAY  You may experience abdominal discomfort such as a feeling of fullness or gas pains.  FOLLOW-UP  Your doctor will discuss the results of your test with you.  SEEK IMMEDIATE MEDICAL ATTENTION IF ANY OF THE FOLLOWING OCCUR:  Excessive nausea (feeling sick to your stomach) and/or vomiting.   Severe abdominal pain and distention (swelling).   Trouble swallowing.   Temperature over 101 F (37.8 C).   Rectal bleeding or vomiting of blood.    Clear liquid diet remainder of today  Advance to a soft diet tomorrow morning.  May resume regular medications today.  Continue  Protonix 40 mg daily  Office visit with [place] in 3 months.   Clear Liquid Diet A clear liquid diet is a short-term diet that is prescribed to provide the necessary fluid and basic energy you need when you can have nothing else. The clear liquid diet consists of liquids or solids that will become liquid at room temperature. You should be able to see through the liquid. There are many reasons that you may be restricted to clear liquids, such as:  When you have a sudden-onset (acute) condition that occurs before or after surgery.  To help your body slowly get adjusted to food again after a long period when you were unable to have food.  Replacement of fluids when you have a diarrheal disease.  When you are going to have certain exams, such as a colonoscopy, in which instruments are inserted inside your body to look at parts of your digestive system. WHAT CAN I HAVE? A clear liquid diet does not provide all the nutrients you need. It is important to choose a variety of the following items to get as many nutrients as possible:  Vegetable juices that do not have pulp.  Fruit juices and fruit drinks that do not have pulp.  Coffee (regular or decaffeinated), tea, or soda at the discretion of your health care provider.  Clear bouillon, broth, or strained broth-based soups.  High-protein and flavored gelatins.  Sugar or honey.  Ices or frozen ice pops that do not contain milk. If you are not sure whether you can have  certain items, you should ask your health care provider. You may also ask your health care provider if there are any other clear liquid options.   This information is not intended to replace advice given to you by your health care provider. Make sure you discuss any questions you have with your health care provider.   Document Released: 10/18/2005 Document Revised: 10/23/2013 Document Reviewed: 09/14/2013 Elsevier Interactive Patient Education 2016 [place] Meal  Plan A soft-food meal plan includes foods that are safe and easy to swallow. This meal plan typically is used:  If you are having trouble chewing or swallowing foods.  As a transition meal plan after only having had liquid meals for a long period. WHAT DO I NEED TO KNOW ABOUT THE SOFT-FOOD MEAL PLAN? A soft-food meal plan includes tender foods that are soft and easy to chew and swallow. In most cases, bite-sized pieces of food are easier to swallow. A bite-sized piece is about  inch or smaller. Foods in this plan do not need to be ground or pureed. Foods that are very hard, crunchy, or sticky should be avoided. Also, breads, cereals, yogurts, and desserts with nuts, seeds, or fruits should be avoided. WHAT FOODS CAN I EAT? Grains Rice and wild rice. Moist bread, dressing, pasta, and noodles. Well-moistened dry or cooked cereals, such as farina (cooked wheat cereal), oatmeal, or grits. Biscuits, breads, muffins, pancakes, and waffles that have been well moistened. Vegetables Shredded lettuce. Cooked, tender vegetables, including potatoes without skins. Vegetable juices. Broths or creamed soups made with vegetables that are not stringy or chewy. Strained tomatoes (without seeds). Fruits Canned or well-cooked fruits. Soft (ripe), peeled fresh fruits, such as peaches, nectarines, kiwi, cantaloupe, honeydew melon, and watermelon (without seeds). Soft berries with small seeds, such as strawberries. Fruit juices (without pulp). Meats and Other Protein Sources Moist, tender, lean beef. Mutton. Lamb. Veal. Chicken. [place]. Liver. Ham. Fish without bones. Eggs. Dairy Milk, milk drinks, and cream. Plain cream cheese and cottage cheese. Plain yogurt. Sweets/Desserts Flavored gelatin desserts. Custard. Plain ice cream, frozen yogurt, sherbet, milk shakes, and malts. Plain cakes and cookies. Plain hard candy.  Other Butter, margarine (without trans fat), and cooking oils. Mayonnaise. Cream sauces. Mild  spices, salt, and sugar. Syrup, molasses, honey, and jelly. The items listed above may not be a complete list of recommended foods or beverages. Contact your dietitian for more options. WHAT FOODS ARE NOT RECOMMENDED? Grains Dry bread, toast, crackers that have not been moistened. Coarse or dry cereals, such as bran, granola, and shredded wheat. Tough or chewy crusty breads, such as [place] bread or baguettes. Vegetables Corn. Raw vegetables except shredded lettuce. Cooked vegetables that are tough or stringy. Tough, crisp, fried potatoes and potato skins. Fruits Fresh fruits with skins or seeds or both, such as apples, pears, or grapes. Stringy, high-pulp fruits, such as papaya, pineapple, coconut, or mango. Fruit leather, fruit roll-ups, and all dried fruits. Meats and Other Protein Sources Sausages and hot dogs. Meats with gristle. Fish with bones. Nuts, seeds, and chunky peanut or other nut butters. Sweets/Desserts Cakes or cookies that are very dry or chewy.  The items listed above may not be a complete list of foods and beverages to avoid. Contact your dietitian for more information.   This information is not intended to replace advice given to you by your health care provider. Make sure you discuss any questions you have with your health care provider.   Document Released: 01/25/2008 Document Revised: 10/23/2013  Document Reviewed: 09/14/2013 Elsevier Interactive Patient Education [employer].

## 2016-04-08 NOTE — H&P (View-Only) (Signed)
Please schedule patient for EGD with dilation with Dr. Gala Romney for this Thursday afternoon. She may have clear liquids up until 6 hours before the procedure.

## 2016-04-08 NOTE — Op Note (Signed)
[facility] Patient Name: Monica [name] Procedure Date: 04/08/2016 10:57 AM MRN: [id number] Date of Birth: 06-Jan-1941 Attending MD: Norvel Richards , MD CSN: [id number] Age: 75 Admit Type: Outpatient Procedure:                Upper GI endoscopy with [facility] dilation Indications:              Dysphagia; Recently completation therap for MALToma Providers:                Norvel Richards, MD, Gwenlyn Fudge, RN, Randa Spike, Technician Referring MD:              Medicines:                Midazolam 4 mg IV, Meperidine mg IV, Ondansetron 4                            mg IV Complications:            No immediate complications. Estimated Blood Loss:     Estimated blood loss: 5 mL. Procedure:                Pre-Anesthesia Assessment:                           - Prior to the procedure, a History and Physical                            was performed, and patient medications and                            allergies were reviewed. The patient's tolerance of                            previous anesthesia was also reviewed. The risks                            and benefits of the procedure and the sedation                            options and risks were discussed with the patient.                            All questions were answered, and informed consent                            was obtained. Prior Anticoagulants: The patient has                            taken no previous anticoagulant or antiplatelet                            agents. ASA Grade Assessment: II - A patient with  mild systemic disease. After reviewing the risks                            and benefits, the patient was deemed in                            satisfactory condition to undergo the procedure.                           After obtaining informed consent, the endoscope was                            passed under direct vision. Throughout the                procedure, the patient's blood pressure, pulse, and                            oxygen saturations were monitored continuously. The                            [id number]) was introduced through the mouth,                            and advanced to the second part of duodenum. The                            upper GI endoscopy was accomplished without                            difficulty. The patient tolerated the procedure                            well. The patient tolerated the procedure well. 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 Mucosa was friable at this level.       Scope traversed this level with mild resistance. Stomach empty. 7-8 cm       hiatal hernia again noted. Area of lymphoma again visualized. However,       no ulceration. Area appeared to be smaller than seen previously.There       was localized friability and some scar formation noted at this site.       Pylorus patent. Examination of the first and second portion revealed no       abnormalities.      The scope was withdrawn. Dilation was performed with a Maloney dilator       with mild resistance at 63 Fr. Dilation was performed with a Maloney       dilator with mild resistance at 56 Fr. The dilation site was examined       following endoscope reinsertion and showed moderate improvement in       luminal narrowing. Estimated blood loss: 5 mL. No apparent complications       seen.      Marland Kitchen  The second portion of the duodenum was normal. Impression:               - Moderate Schatzki ring / Web. Dilated. large                            hiatal hernia.                           - Localized area of gastric lymphoma?"visualized and                            appeared to be much improved..                           -  Normal second portion of the duodenum.                           - No specimens collected. Esophageal lumen notably                            tightened up significantly since her dilation in                            April of this year. I wonder if the GE junction was                            in the radiation field at all during her recent                            treatment. Onset of her recurrent dysphagia was                            somewhat rapid. Moderate Sedation:      Moderate (conscious) sedation was administered by the endoscopy nurse       and supervised by the endoscopist. The following parameters were       monitored: oxygen saturation, heart rate, blood pressure, respiratory       rate, EKG, adequacy of pulmonary ventilation, and response to care.       Total physician intraservice time was 19 minutes. Recommendation:           - Patient has a contact number available for                            emergencies. The signs and symptoms of potential                            delayed complications were discussed with the                            patient. Return to normal activities tomorrow.                            Written discharge instructions were provided to the  patient.                           - Clear liquid diet today. advanced to a soft diet                            tomorrow                           - Continue present medications including Protonix                            40 mg daily.                           - May or may not need a repeat endoscopy with                            additional dilations in the future as discussed                            with her husband today..                           - Return to GI office in 3 months. Procedure Code(s):        --- Professional ---                           [phone], Esophagogastroduodenoscopy, flexible,                            transoral; diagnostic, including  collection of                            specimen(s) by brushing or washing, when performed                            (separate procedure)                           43450, Dilation of esophagus, by unguided sound or                            bougie, single or multiple passes                           99152, Moderate sedation services provided by the                            same physician or other qualified health care                            professional performing the diagnostic or                            therapeutic service that the sedation supports,  requiring the presence of an independent trained                            observer to assist in the monitoring of the                            patient's level of consciousness and physiological                            status; initial 15 minutes of intraservice time,                            patient age 73 years or older Diagnosis Code(s):        --- Professional ---                           K22.2, Esophageal obstruction                           K31.89, Other diseases of stomach and duodenum                           R13.10, Dysphagia, unspecified CPT copyright 2016 American Medical Association. All rights reserved. The codes documented in this report are preliminary and upon coder review may  be revised to meet current compliance requirements. Cristopher Estimable. Sherrick Araki, MD Norvel Richards, MD 04/08/2016 12:07:04 PM This report has been signed electronically. Number of Addenda: 0

## 2016-04-08 NOTE — Interval H&P Note (Signed)
History and Physical Interval Note:  04/08/2016 11:02 AM  Monica [name]  has presented today for surgery, with the diagnosis of dysphagia  The various methods of treatment have been discussed with the patient and family. After consideration of risks, benefits and other options for treatment, the patient has consented to  Procedure(s) with comments: ESOPHAGOGASTRODUODENOSCOPY (EGD) (N/A) - 145 - moved to 10:45 - pt notified per office MALONEY DILATION (N/A) as a surgical intervention .  The patient's history has been reviewed, patient examined, no change in status, stable for surgery.  I have reviewed the patient's chart and labs.  Questions were answered to the patient's satisfaction.     [name]  No change. EGD/ED, etc. as feasible/appropriate.  The risks, benefits, limitations, alternatives and imponderables have been reviewed with the patient. Potential for esophageal dilation, biopsy, etc. have also been reviewed.  Questions have been answered. All parties agreeable.

## 2016-04-09 ENCOUNTER — Ambulatory Visit: Payer: Medicare Other

## 2016-04-12 ENCOUNTER — Ambulatory Visit: Payer: Medicare Other

## 2016-04-13 ENCOUNTER — Ambulatory Visit: Payer: Medicare Other | Admitting: Internal Medicine

## 2016-04-13 ENCOUNTER — Ambulatory Visit: Payer: Medicare Other

## 2016-04-13 ENCOUNTER — Encounter (HOSPITAL_COMMUNITY): Payer: Self-pay | Admitting: Internal Medicine

## 2016-04-14 ENCOUNTER — Ambulatory Visit: Payer: Medicare Other

## 2016-04-16 NOTE — Progress Notes (Signed)
  Radiation Oncology         [phone] ________________________________  Name: Monica [name] MRN: [id number]  Date: 04/08/2016  DOB: 1941-04-29  End of Treatment Note  Diagnosis:   MALToma ([place])   Staging form: Lymphoid Neoplasms, AJCC 6th Edition     Clinical stage from 03/23/2016: Stage I - Signed by Baird Cancer, PA-C on 03/23/2016      Indication for treatment:  Curative, Definitive RT       Radiation treatment dates:   03/18/2016-04/08/2016  Site/dose:   The gastric tumor received 30 Gy in 15 fractions of 2 Gy  Beams/energy:   IMRT with IGRT was used to deliver 6 MV X-rays to 2 VMAT fields  Narrative: The patient tolerated radiation treatment relatively well.   Some Nausea.  Plan: The patient has completed radiation treatment. The patient will return to radiation oncology clinic for routine followup in one month. I advised them to call or return sooner if they have any questions or concerns related to their recovery or treatment.  ------------------------------------------------   Tyler Pita, MD [place] Director and Director of Stereotactic Radiosurgery Direct Dial: [phone]  Fax: [phone] [email or web address]  Skype  [email or web address]

## 2016-04-29 ENCOUNTER — Encounter: Payer: Self-pay | Admitting: Radiation Oncology

## 2016-04-29 ENCOUNTER — Ambulatory Visit
Admission: RE | Admit: 2016-04-29 | Discharge: 2016-04-29 | Disposition: A | Discharge status: Home / Self Care | Payer: Medicare Other | Source: Ambulatory Visit | Attending: Radiation Oncology | Admitting: Radiation Oncology

## 2016-04-29 VITALS — BP 177/80 | HR 71 | Temp 97.9°F | Resp 18 | Ht 66.0 in | Wt 165.7 lb

## 2016-04-29 DIAGNOSIS — C884 Extranodal marginal zone b-cell lymphoma of mucosa-associated lymphoid tissue (malt-lymphoma) not having achieved remission: Secondary | ICD-10-CM

## 2016-04-29 NOTE — Progress Notes (Signed)
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 MALToma ([place]) 200.30 C88.4     Interval Since Last Radiation:  2 weeks, (04/08/2016)   Narrative:  The patient returns today for routine follow-up.  Bowel/Bladder complaints, if any:No problems Nausea / Vomiting, if any: Some nausea has improved no anti-nausea medication,taking Protonix daily Skin:Normal to abdomen Pain :None Appetite:Beginning to feel hunger and really want to eat now. Fatigue:Having fatigue in the afternoon. Last appointment with Dr. Whitney Muse was 03-05-16, Mrs. Donathan back to them in August.                              ALLERGIES:  is allergic to sulfonamide derivatives.  Meds: Current Outpatient Prescriptions  Medication Sig Dispense Refill  . aspirin 81 MG tablet Take 81 mg by mouth daily.    Marland Kitchen BIOGAIA PROBIOTIC (BIOGAIA PROBIOTIC) LIQD Take by mouth daily at 8 pm.    . Calcium-Magnesium-Vitamin D (CALCIUM 500 PO) Take 500 mg by mouth daily.    . citalopram (CELEXA) 20 MG tablet Take 20 mg by mouth daily.     Marland Kitchen LORazepam (ATIVAN) 0.5 MG tablet Place 1 tablet (0.5 mg total) under the tongue 2 (two) times daily as needed (nausea). 30 tablet 0  . metFORMIN (GLUCOPHAGE-XR) 500 MG 24 hr tablet Take 500 mg by mouth daily with breakfast.     . metoprolol tartrate (LOPRESSOR) 25 MG tablet Take 25 mg by mouth daily.     . Multiple Vitamin (MULTIVITAMIN) capsule Take 1 capsule by mouth daily.    . pantoprazole (PROTONIX) 40 MG tablet Take 1 tablet (40 mg total) by mouth daily. 90 tablet 3  . simvastatin (ZOCOR) 20 MG tablet Take 20 mg by mouth every evening.      No current facility-administered medications for this encounter.    Physical Findings: The patient is in  no acute distress. Patient is alert and oriented.  height is '5\' 6"'$  (1.676 m) and weight is 165 lb 11.2 oz (75.161 kg). Her oral temperature is 97.9 F (36.6 C). Her blood pressure is 177/80 and her pulse is 71. Her respiration is 18 and oxygen saturation is 100%. .  No significant changes.  Lab Findings: Lab Results  Component Value Date   WBC 7.3 03/01/2016   HGB 11.9* 03/01/2016   HCT 37.3 03/01/2016   PLT 278 03/01/2016    Lab Results  Component Value Date   NA 137 03/01/2016   K 3.8 03/01/2016   CO2 24 03/01/2016   GLUCOSE 120* 03/01/2016   BUN 14 03/01/2016   CREATININE 0.66 03/01/2016   BILITOT 0.4 03/01/2016   ALKPHOS 68 03/01/2016   AST 22 03/01/2016   ALT 16 03/01/2016   PROT 7.3 03/01/2016   ALBUMIN 3.9 03/01/2016   CALCIUM 8.7* 03/01/2016   ANIONGAP 9 03/01/2016    Radiographic Findings: No results found.  Impression:  The patient is recovering from the effects of radiation.    Plan:  Follow-up with Dr. Whitney Muse and in rad-onc as needed.  Discussed possible imaging options and possible upper endoscopy today deferring to Dr. Whitney Muse.  _____________________________________  Sheral Apley. Tammi Klippel,  M.D.   This document serves as a record of services personally performed by Tyler Pita, MD. It was created on his behalf by Truddie Hidden, a trained medical scribe. The creation of this record is based on the scribe's personal observations and the provider's statements to them. This document has been checked and approved by the attending provider.

## 2016-04-29 NOTE — Progress Notes (Signed)
Mrs. Demo is here for a follow up gastric Malt lymphoma.  Weight changes, if any: Wt Readings from Last 3 Encounters:  04/29/16 165 lb 11.2 oz (75.161 kg)  04/08/16 165 lb (74.844 kg)  04/06/16 165 lb 6.4 oz (75.025 kg)    Bowel/Bladder complaints, if any:No problems Nausea / Vomiting, if any: Some nausea has improved no anti-nausea medication,taking Protonix daily Skin:Normal to abdomen Pain :None Appetite:Beginning to feel hunger and really want to eat now. Fatigue:Having fatigue in the afternoon. Last appointment with Dr. Whitney Muse was 03-05-16, Mrs. Schrecengost will check with her office to see when she has another appointment. BP 177/80 mmHg  Pulse 71  Temp(Src) 97.9 F (36.6 C) (Oral)  Resp 18  Ht '5\' 6"'$  (1.676 m)  Wt 165 lb 11.2 oz (75.161 kg)  BMI 26.76 kg/m2  SpO2 100%

## 2016-04-29 NOTE — Addendum Note (Signed)
Encounter addended by: Heywood Footman, RN on: 04/29/2016  1:44 PM<BR>     Documentation filed: Charges VN

## 2016-05-10 DIAGNOSIS — C884 Extranodal marginal zone B-cell lymphoma of mucosa-associated lymphoid tissue [MALT-lymphoma]: Secondary | ICD-10-CM | POA: Diagnosis not present

## 2016-05-10 DIAGNOSIS — Z0000 Encounter for general adult medical examination without abnormal findings: Secondary | ICD-10-CM | POA: Diagnosis not present

## 2016-05-10 DIAGNOSIS — E7800 Pure hypercholesterolemia, unspecified: Secondary | ICD-10-CM | POA: Diagnosis not present

## 2016-05-10 DIAGNOSIS — K219 Gastro-esophageal reflux disease without esophagitis: Secondary | ICD-10-CM | POA: Diagnosis not present

## 2016-05-10 DIAGNOSIS — Z1389 Encounter for screening for other disorder: Secondary | ICD-10-CM | POA: Diagnosis not present

## 2016-05-10 DIAGNOSIS — N3020 Other chronic cystitis without hematuria: Secondary | ICD-10-CM | POA: Diagnosis not present

## 2016-05-10 DIAGNOSIS — F329 Major depressive disorder, single episode, unspecified: Secondary | ICD-10-CM | POA: Diagnosis not present

## 2016-05-10 DIAGNOSIS — E119 Type 2 diabetes mellitus without complications: Secondary | ICD-10-CM | POA: Diagnosis not present

## 2016-05-10 DIAGNOSIS — Z6825 Body mass index (BMI) 25.0-25.9, adult: Secondary | ICD-10-CM | POA: Diagnosis not present

## 2016-05-18 DIAGNOSIS — Z1231 Encounter for screening mammogram for malignant neoplasm of breast: Secondary | ICD-10-CM | POA: Diagnosis not present

## 2016-06-04 ENCOUNTER — Encounter (HOSPITAL_COMMUNITY): Payer: Medicare Other

## 2016-06-04 ENCOUNTER — Encounter (HOSPITAL_COMMUNITY): Payer: Medicare Other | Attending: Oncology | Admitting: Oncology

## 2016-06-04 VITALS — BP 148/66 | HR 78 | Temp 97.9°F | Resp 16 | Wt 163.6 lb

## 2016-06-04 DIAGNOSIS — Z808 Family history of malignant neoplasm of other organs or systems: Secondary | ICD-10-CM | POA: Diagnosis not present

## 2016-06-04 DIAGNOSIS — R911 Solitary pulmonary nodule: Secondary | ICD-10-CM | POA: Diagnosis not present

## 2016-06-04 DIAGNOSIS — C8590 Non-Hodgkin lymphoma, unspecified, unspecified site: Secondary | ICD-10-CM | POA: Insufficient documentation

## 2016-06-04 DIAGNOSIS — Z9889 Other specified postprocedural states: Secondary | ICD-10-CM | POA: Insufficient documentation

## 2016-06-04 DIAGNOSIS — Z720 Tobacco use: Secondary | ICD-10-CM | POA: Diagnosis not present

## 2016-06-04 DIAGNOSIS — Z7982 Long term (current) use of aspirin: Secondary | ICD-10-CM | POA: Diagnosis not present

## 2016-06-04 DIAGNOSIS — F17210 Nicotine dependence, cigarettes, uncomplicated: Secondary | ICD-10-CM | POA: Insufficient documentation

## 2016-06-04 DIAGNOSIS — Z7984 Long term (current) use of oral hypoglycemic drugs: Secondary | ICD-10-CM | POA: Diagnosis not present

## 2016-06-04 DIAGNOSIS — E785 Hyperlipidemia, unspecified: Secondary | ICD-10-CM | POA: Insufficient documentation

## 2016-06-04 DIAGNOSIS — C884 Extranodal marginal zone B-cell lymphoma of mucosa-associated lymphoid tissue [MALT-lymphoma]: Secondary | ICD-10-CM | POA: Diagnosis not present

## 2016-06-04 DIAGNOSIS — K449 Diaphragmatic hernia without obstruction or gangrene: Secondary | ICD-10-CM | POA: Diagnosis not present

## 2016-06-04 DIAGNOSIS — R1310 Dysphagia, unspecified: Secondary | ICD-10-CM | POA: Insufficient documentation

## 2016-06-04 DIAGNOSIS — F329 Major depressive disorder, single episode, unspecified: Secondary | ICD-10-CM | POA: Insufficient documentation

## 2016-06-04 DIAGNOSIS — E119 Type 2 diabetes mellitus without complications: Secondary | ICD-10-CM | POA: Diagnosis not present

## 2016-06-04 LAB — COMPREHENSIVE METABOLIC PANEL
ALT: 23 U/L (ref 14–54)
AST: 29 U/L (ref 15–41)
Albumin: 4.3 g/dL (ref 3.5–5.0)
Alkaline Phosphatase: 82 U/L (ref 38–126)
Anion gap: 3 — ABNORMAL LOW (ref 5–15)
BUN: 13 mg/dL (ref 6–20)
CHLORIDE: 104 mmol/L (ref 101–111)
CO2: 30 mmol/L (ref 22–32)
CREATININE: 0.82 mg/dL (ref 0.44–1.00)
Calcium: 9.4 mg/dL (ref 8.9–10.3)
GFR calc Af Amer: >60 mL/min (ref >60)
Glucose, Bld: 114 mg/dL — ABNORMAL HIGH (ref 65–99)
Potassium: 4.8 mmol/L (ref 3.5–5.1)
SODIUM: 137 mmol/L (ref 135–145)
Total Bilirubin: 0.4 mg/dL (ref 0.3–1.2)
Total Protein: 8 g/dL (ref 6.5–8.1)

## 2016-06-04 LAB — LACTATE DEHYDROGENASE: LDH: 142 U/L (ref 98–192)

## 2016-06-04 LAB — CBC WITH DIFFERENTIAL/PLATELET
BASOS ABS: 0 10*3/uL (ref 0.0–0.1)
Basophils Relative: 1 %
EOS PCT: 2 %
Eosinophils Absolute: 0.1 10*3/uL (ref 0.0–0.7)
HCT: 39.6 % (ref 36.0–46.0)
Hemoglobin: 13.1 g/dL (ref 12.0–15.0)
LYMPHS PCT: 17 %
Lymphs Abs: 1.1 10*3/uL (ref 0.7–4.0)
MCH: 28.1 pg (ref 26.0–34.0)
MCHC: 33.1 g/dL (ref 30.0–36.0)
MCV: 84.8 fL (ref 78.0–100.0)
Monocytes Absolute: 0.7 10*3/uL (ref 0.1–1.0)
Monocytes Relative: 11 %
NEUTROS ABS: 4.5 10*3/uL (ref 1.7–7.7)
NEUTROS PCT: 69 %
PLATELETS: 223 10*3/uL (ref 150–400)
RBC: 4.67 MIL/uL (ref 3.87–5.11)
RDW: 16.8 % — ABNORMAL HIGH (ref 11.5–15.5)
WBC: 6.5 10*3/uL (ref 4.0–10.5)

## 2016-06-04 NOTE — Assessment & Plan Note (Addendum)
Stage I Gastric MALT Lymphoma, H. Pylori NEGATIVE.  S/P definitive XRT from 03/18/2016- 04/08/2016.  Oncology history is updated.  Labs today CBC diff, CMET, LDH.  I personally reviewed and went over laboratory results with the patient.  The results are noted within this dictation.  I personally reviewed and went over radiographic studies with the patient.  The results are noted within this dictation.  I reviewed past PET imaging with the patient.  Will perform CT of chest wo contrast in November 2017 to follow-up on pulmonary nodule in the setting of tobacco abuse.  She finished XRT at the beginning of June 2017.  NCCN guidelines recommend repeat endoscopy with biopsy 3-6 months following XRT.  I will refer the patient back to Dr. Gala Romney for consideration of EGD with biopsy in the next 1-3 months.  Based upon findings, further medical oncology recommendations will follow.  I have reviewed the NCCN guidelines with the patient and she is provided a copy.  She is still smoking 1 ppd and smoking cessation education is provided.  Return in 3 months for follow-up.

## 2016-06-04 NOTE — Progress Notes (Signed)
Monica Lair, MD [place] Monica [name] [id number]  MALToma [facility]) - Plan: CBC with Differential, Comprehensive metabolic panel, Lactate dehydrogenase, CBC with Differential, Comprehensive metabolic panel, Lactate dehydrogenase, CBC with Differential, Comprehensive metabolic panel, Lactate dehydrogenase  Pulmonary nodule - Plan: CT Chest Wo Contrast  CURRENT THERAPY: Surveillance per NCCN guidelines  INTERVAL HISTORY: Monica [name] 75 y.o. female returns for followup of Stage I Gastric MALT Lymphoma, H. Pylori NEGATIVE.  S/P definitive XRT from 03/18/2016- 04/08/2016.    MALToma ([place])   03/21/2015 Miscellaneous    H Pylori IgG NEGATIVE     02/03/2016 Procedure    EGD Dr. Gala Romney, abnormal gastric mucosa. Pathology with EXTRANODAL Marginal zone lymphoma. NEGATIVE for H. Pylori     03/03/2016 Miscellaneous    H pylori stool antigen NEGATIVE     03/03/2016 PET scan    Focal area of hypermetabolism and wall thickening involving the body antral junction region of the stomach c/w history of lymphoma. 5.5 mm LLL pulm nodule not hypermetabolic. Non contrast chest CT in 6 month     03/18/2016 - 04/08/2016 Radiation Therapy    The gastric tumor received 30 Gy in 15 fractions of 2 Gy     She denies any complaints today.  Review of Systems  Constitutional: Negative.  Negative for chills, fever, malaise/fatigue and weight loss.  HENT: Negative.   Eyes: Negative.   Respiratory: Negative.   Cardiovascular: Negative.   Gastrointestinal: Negative.  Negative for abdominal pain, nausea and vomiting.  Genitourinary: Negative.   Musculoskeletal: Negative.   Skin: Negative.   Neurological: Negative.  Negative for weakness.  Endo/Heme/Allergies: Negative.   Psychiatric/Behavioral: Negative.     Past Medical History:  Diagnosis Date  . Cancer (HCC)    NHL, Malt Lymphoma  . Depression   . DM (diabetes mellitus) ([place])   . Dysphagia   . Dysrhythmia    History of palpatations  .  Heart palpitations   . Hiatal hernia   . Hyperlipidemia   . Interstitial cystitis   . Sinusitis     Past Surgical History:  Procedure Laterality Date  . COLONOSCOPY     Dr. Lindalou Hose 2009: Normal per PCP notes  . ESOPHAGOGASTRODUODENOSCOPY     RMR: Prominant Schzgzki ring/component of peptic stricture status post dilation and disruption as described above, otherwise norma esophagus, moderate-sized hiatal hernia, antal pyloric channel, and posterier bulbar erosions, otherwise unremarkable stomach, D1 and D2 . Inflammatory findings on the stomach and duodenum will likely be related to aspirin effect. We need to rule out Helicobacter pylor  . ESOPHAGOGASTRODUODENOSCOPY N/A 03/10/2015   Dr. 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 Pathology with lymphoid population of stomach, slight atypia  . ESOPHAGOGASTRODUODENOSCOPY N/A 02/03/2016   Dr. Gala Romney: Schatzki ring noted at GE junction, dilated with 89 and then 33 Glidden dilator. Large hiatal hernia. 6 x 7 cm nodular geographically ulcerated mucosa, biopsy c/w MALToma  . ESOPHAGOGASTRODUODENOSCOPY N/A 04/08/2016   Procedure: ESOPHAGOGASTRODUODENOSCOPY (EGD);  Surgeon: Daneil Dolin, MD;  [facility];  Service: Endoscopy;  Laterality: N/A;  145 - moved to 10:45 - pt notified per office  . MALONEY DILATION N/A 03/10/2015   Procedure: Venia Minks DILATION;  Surgeon: Daneil Dolin, MD;  Location: [facility];  Service: Endoscopy;  Laterality: N/A;  . Venia Minks DILATION N/A 02/03/2016   Procedure: Venia Minks DILATION;  Surgeon: Daneil Dolin, MD;  Location: [facility]  END[facility];  Service: Endoscopy;  Laterality: N/A;  . Venia Minks DILATION N/A 04/08/2016   Procedure: Venia Minks DILATION;  Surgeon: Daneil Dolin, MD;  Location: [facility];  Service: Endoscopy;  Laterality: N/A;    Family History  Problem Relation Age of Onset  . Colon cancer Neg Hx     Social History   Social History    . Marital status: Married    Spouse name: N/A  . Number of children: N/A  . Years of education: N/A   Social History Main Topics  . Smoking status: Current Every Day Smoker    Packs/day: 0.75    Years: 57.00    Types: Cigarettes  . Smokeless tobacco: Not on file  . Alcohol use No  . Drug use: No  . Sexual activity: Not on file   Other Topics Concern  . Not on file   Social History Narrative  . No narrative on file     PHYSICAL EXAMINATION  ECOG PERFORMANCE STATUS: 1 - Symptomatic but completely ambulatory  Vitals:   06/04/16 1014  BP: (!) 148/66  Pulse: 78  Resp: 16  Temp: 97.9 F (36.6 C)    GENERAL:alert, no distress, well developed, comfortable, cooperative, smiling and accompanied by her husband, Tom SKIN: skin color, texture, turgor are normal, no rashes or significant lesions HEAD: Normocephalic, No masses, lesions, tenderness or abnormalities EYES: normal, EOMI, Conjunctiva are pink and non-injected EARS: External ears normal OROPHARYNX:lips, buccal mucosa, and tongue normal and mucous membranes are moist  NECK: supple, trachea midline LYMPH:  no palpable lymphadenopathy BREAST:not examined LUNGS: clear to auscultation  HEART: regular rate & rhythm, no murmurs, no gallops, S1 normal and S2 normal ABDOMEN:abdomen soft, non-tender and normal bowel sounds BACK: Back symmetric, no curvature., No CVA tenderness EXTREMITIES:less then 2 second capillary refill, no joint deformities, effusion, or inflammation, no skin discoloration, no cyanosis  NEURO: alert & oriented x 3 with fluent speech, no focal motor/sensory deficits, gait normal   LABORATORY DATA: CBC    Component Value Date/Time   WBC 6.5 06/04/2016 1123   RBC 4.67 06/04/2016 1123   HGB 13.1 06/04/2016 1123   HCT 39.6 06/04/2016 1123   PLT 223 06/04/2016 1123   MCV 84.8 06/04/2016 1123   MCH 28.1 06/04/2016 1123   MCHC 33.1 06/04/2016 1123   RDW 16.8 (H) 06/04/2016 1123   LYMPHSABS 1.1  06/04/2016 1123   MONOABS 0.7 06/04/2016 1123   EOSABS 0.1 06/04/2016 1123   BASOSABS 0.0 06/04/2016 1123      Chemistry      Component Value Date/Time   NA 137 06/04/2016 1123   K 4.8 06/04/2016 1123   CL 104 06/04/2016 1123   CO2 30 06/04/2016 1123   BUN 13 06/04/2016 1123   CREATININE 0.82 06/04/2016 1123      Component Value Date/Time   CALCIUM 9.4 06/04/2016 1123   ALKPHOS 82 06/04/2016 1123   AST 29 06/04/2016 1123   ALT 23 06/04/2016 1123   BILITOT 0.4 06/04/2016 1123        PENDING LABS:   RADIOGRAPHIC STUDIES:  No results found.   PATHOLOGY:    ASSESSMENT AND PLAN:  MALToma ([place]) Stage I Gastric MALT Lymphoma, H. Pylori NEGATIVE.  S/P definitive XRT from 03/18/2016- 04/08/2016.  Oncology history is updated.  Labs today CBC diff, CMET, LDH.  I personally reviewed and went over laboratory results with the patient.  The results are noted within this dictation.  I personally reviewed and went  over radiographic studies with the patient.  The results are noted within this dictation.  I reviewed past PET imaging with the patient.  Will perform CT of chest wo contrast in November 2017 to follow-up on pulmonary nodule in the setting of tobacco abuse.  She finished XRT at the beginning of June 2017.  NCCN guidelines recommend repeat endoscopy with biopsy 3-6 months following XRT.  I will refer the patient back to Dr. Gala Romney for consideration of EGD with biopsy in the next 1-3 months.  Based upon findings, further medical oncology recommendations will follow.  I have reviewed the NCCN guidelines with the patient and she is provided a copy.  She is still smoking 1 ppd and smoking cessation education is provided.  Return in 3 months for follow-up.   ORDERS PLACED FOR THIS ENCOUNTER: Orders Placed This Encounter  Procedures  . CT Chest Wo Contrast  . CBC with Differential  . Comprehensive metabolic panel  . Lactate dehydrogenase  . CBC with Differential  .  Comprehensive metabolic panel  . Lactate dehydrogenase    MEDICATIONS PRESCRIBED THIS ENCOUNTER: No orders of the defined types were placed in this encounter.   THERAPY PLAN:  Will refer the patient back to Dr. Gala Romney for consideration of EGD with biopsy in the next 1-3 months per NCCN guidelines.  Further recommendations will follow based upon findings.  All questions were answered. The patient knows to call the clinic with any problems, questions or concerns. We can certainly see the patient much sooner if necessary.  Patient and plan discussed with Dr. Ancil Linsey and she is in agreement with the aforementioned.   This note is electronically signed by: Robynn Pane, PA-C 06/04/2016 6:18 PM

## 2016-06-04 NOTE — Patient Instructions (Signed)
[place] at [facility] Discharge Instructions  RECOMMENDATIONS MADE BY THE CONSULTANT AND ANY TEST RESULTS WILL BE SENT TO YOUR REFERRING PHYSICIAN.   Exam and discussion by Kirby Crigler PA today Labs today-please stop by the main entrance and check for them to do your lab work. Labs in 3 months CT scans in 3 months Return to see the doctor in 3 months Please call the clinic if you have any questions or concerns     Thank you for choosing [place] at [facility] to provide your oncology and hematology care.  To afford each patient quality time with our provider, please arrive at least 15 minutes before your scheduled appointment time.   Beginning January 23rd 2017 lab work for the [facility] will be done in the  Main lab at [facility] on 1st floor. If you have a lab appointment with the [place] please come in thru the  Main Entrance and check in at the main information desk  You need to re-schedule your appointment should you arrive 10 or more minutes late.  We strive to give you quality time with our providers, and arriving late affects you and other patients whose appointments are after yours.  Also, if you no show three or more times for appointments you may be dismissed from the clinic at the providers discretion.     Again, thank you for choosing [facility].  Our hope is that these requests will decrease the amount of time that you wait before being seen by our physicians.       _____________________________________________________________  Should you have questions after your visit to [facility], please contact our office at [phone] between the hours of 8:30 a.m. and 4:30 p.m.  Voicemails left after 4:30 p.m. will not be returned until the following business day.  For prescription refill requests, have your pharmacy contact our office.         Resources For Cancer Patients and their  Caregivers ? American Cancer Society: Can assist with transportation, wigs, general needs, runs Look Good Feel Better.        732-082-0778 ? Cancer Care: Provides financial assistance, online support groups, medication/co-pay assistance.  1-800-813-HOPE 215-098-3592) ? [place] Assists [place] cancer patients and their families through emotional , educational and financial support.  712 378 8508 ? [place] DSS Where to apply for food stamps, Medicaid and utility assistance. (870) 188-8918 ? [employer]: Transportation to medical appointments. 281-861-1930 ? Social Security Administration: May apply for disability if have a Stage IV cancer. 914 650 1071 419-003-0062 ? [employer], Disability and Transit Services: Assists with nutrition, care and transit needs. [place] Support Programs: '@10RELATIVEDAYS'$ @ > Cancer Support Group  2nd Tuesday of the month 1pm-2pm, Journey Room  > Creative Journey  3rd Tuesday of the month 1130am-1pm, Journey Room  > Look Good Feel Better  1st Wednesday of the month 10am-12 noon, Journey Room (Call [place] to register [phone])

## 2016-07-06 DIAGNOSIS — J410 Simple chronic bronchitis: Secondary | ICD-10-CM | POA: Diagnosis not present

## 2016-07-06 DIAGNOSIS — Z6825 Body mass index (BMI) 25.0-25.9, adult: Secondary | ICD-10-CM | POA: Diagnosis not present

## 2016-07-06 DIAGNOSIS — R300 Dysuria: Secondary | ICD-10-CM | POA: Diagnosis not present

## 2016-07-06 DIAGNOSIS — R102 Pelvic and perineal pain: Secondary | ICD-10-CM | POA: Diagnosis not present

## 2016-07-09 ENCOUNTER — Ambulatory Visit (INDEPENDENT_AMBULATORY_CARE_PROVIDER_SITE_OTHER): Payer: Medicare Other | Admitting: Gastroenterology

## 2016-07-09 ENCOUNTER — Other Ambulatory Visit: Payer: Self-pay

## 2016-07-09 ENCOUNTER — Encounter: Payer: Self-pay | Admitting: Gastroenterology

## 2016-07-09 VITALS — BP 111/56 | HR 80 | Temp 97.9°F | Ht 66.0 in | Wt 163.8 lb

## 2016-07-09 DIAGNOSIS — C884 Extranodal marginal zone B-cell lymphoma of mucosa-associated lymphoid tissue [MALT-lymphoma]: Secondary | ICD-10-CM

## 2016-07-09 NOTE — Patient Instructions (Signed)
I have changed you from Protonix to [place]. Start taking the Dexilant samples once daily. Let me know how this works for you!  We have set you up for another upper endoscopy with Dr. Gala Romney in the near future!

## 2016-07-09 NOTE — Progress Notes (Signed)
Referring Provider: [name]., MD Primary Care Physician:  [name], MD  Primary GI: Dr. Gala Romney   Chief Complaint  Patient presents with  . Follow-up    HPI:   Monica [name] is a 75 y.o. 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 Localized area of gastric lymphoma visualized and appeared to be much improved. Normal second portion of the duodenum. No specimens collected. Esophageal lumen was notably tightened since dilation in April 2017. She finished radiation therapy the beginning of June 2017; NCCN guidelines recommend repeat endoscopy with biopsy 3-6 months following radiation therapy. Therefore, she will need an EGD between now and October.   States her dysphagia is resolved. Has issues with burping. Sometimes embarrassing. Been burping more since the radiation she believes. Taking Protonix once daily. Denies any reflux symptoms. Just notes excessive burping. No N/V. Takes a probiotic daily. Wants to plan for the first of October for the upper endoscopy.   Past Medical History:  Diagnosis Date  . Cancer (HCC)    NHL, Malt Lymphoma  . Depression   . DM (diabetes mellitus) ([place])   . Dysphagia   . Dysrhythmia    History of palpatations  . Heart palpitations   . Hiatal hernia   . Hyperlipidemia   . Interstitial cystitis   . Sinusitis     Past Surgical History:  Procedure Laterality Date  . COLONOSCOPY     Dr. Lindalou Hose 2009: Normal per PCP notes  . ESOPHAGOGASTRODUODENOSCOPY     RMR: Prominant Schzgzki ring/component of peptic stricture status post dilation and disruption as described above, otherwise norma esophagus, moderate-sized hiatal hernia, antal pyloric channel, and posterier bulbar erosions, otherwise unremarkable stomach, D1 and D2 . Inflammatory findings on the stomach and duodenum will likely be related to aspirin  effect. We need to rule out Helicobacter pylor  . ESOPHAGOGASTRODUODENOSCOPY N/A 03/10/2015   Dr. 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 Pathology with lymphoid population of stomach, slight atypia  . ESOPHAGOGASTRODUODENOSCOPY N/A 02/03/2016   Dr. Gala Romney: Schatzki ring noted at GE junction, dilated with 16 and then 102 [place] dilator. Large hiatal hernia. 6 x 7 cm nodular geographically ulcerated mucosa, biopsy c/w MALToma  . ESOPHAGOGASTRODUODENOSCOPY N/A 04/08/2016   Dr. Gala Romney: moderate Schatzki's ring/web s/p dilation, large hiatal hernia, localized area of gastric lymphoma visualized and appeared to be much improved. normal second portion of the duodenum. No specimens collected. Esophageal lumen notably tighted up significantly since her dilation in April of this year.   Marland Kitchen MALONEY DILATION N/A 03/10/2015   Procedure: Venia Minks DILATION;  Surgeon: Daneil Dolin, MD;  [facility];  Service: Endoscopy;  Laterality: N/A;  . Venia Minks DILATION N/A 02/03/2016   Procedure: Venia Minks DILATION;  Surgeon: Daneil Dolin, MD;  [facility]: [facility];  Service: Endoscopy;  Laterality: N/A;  . Venia Minks DILATION N/A 04/08/2016   Procedure: Venia Minks DILATION;  Surgeon: Daneil Dolin, MD;  [facility]: [facility];  Service: Endoscopy;  Laterality: N/A;    Current Outpatient Prescriptions  Medication Sig Dispense Refill  . aspirin 81 MG tablet Take 81 mg by mouth daily.    Marland Kitchen BIOGAIA PROBIOTIC (BIOGAIA PROBIOTIC) LIQD Take by mouth daily at 8 pm.    . Calcium-Magnesium-Vitamin D (CALCIUM 500 PO) Take 500 mg by mouth daily.    Marland Kitchen  citalopram (CELEXA) 20 MG tablet Take 20 mg by mouth daily.     . metFORMIN (GLUCOPHAGE-XR) 500 MG 24 hr tablet Take 500 mg by mouth daily with breakfast.     . metoprolol tartrate (LOPRESSOR) 25 MG tablet Take 25 mg by mouth daily.     . Multiple Vitamin (MULTIVITAMIN) capsule Take 1 capsule by mouth daily.      . pantoprazole (PROTONIX) 40 MG tablet Take 1 tablet (40 mg total) by mouth daily. 90 tablet 3  . simvastatin (ZOCOR) 20 MG tablet Take 20 mg by mouth every evening.      No current facility-administered medications for this visit.     Allergies as of 07/09/2016 - Review Complete 07/09/2016  Allergen Reaction Noted  . Sulfonamide derivatives Hives   . Sulfur  06/04/2016    Family History  Problem Relation Age of Onset  . Colon cancer Neg Hx     Social History   Social History  . Marital status: Married    Spouse name: N/A  . Number of children: N/A  . Years of education: N/A   Social History Main Topics  . Smoking status: Current Every Day Smoker    Packs/day: 0.75    Years: 57.00    Types: Cigarettes  . Smokeless tobacco: None  . Alcohol use No  . Drug use: No  . Sexual activity: Not Asked   Other Topics Concern  . None   Social History Narrative  . None    Review of Systems: As mentioned in HPI   Physical Exam: BP (!) 111/56   Pulse 80   Temp 97.9 F (36.6 C) (Oral)   Ht '5\' 6"'$  (1.676 m)   Wt 163 lb 12.8 oz (74.3 kg)   BMI 26.44 kg/m  General:   Alert and oriented. No distress noted. Pleasant and cooperative.  Head:  Normocephalic and atraumatic. Eyes:  Conjuctiva clear without scleral icterus. Mouth:  Oral mucosa pink and moist.  Heart:  S1, S2 present without murmurs, rubs, or gallops. Regular rate and rhythm. Abdomen:  +BS, soft, non-tender and non-distended. No rebound or guarding. No HSM or masses noted. Msk:  Symmetrical without gross deformities. Normal posture. Extremities:  Without edema. Neurologic:  Alert and  oriented x4;  grossly normal neurologically. Psych:  Alert and cooperative. Normal mood and affect.

## 2016-07-09 NOTE — Assessment & Plan Note (Signed)
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 Localized area of gastric lymphoma visualized and improved. Radiation therapy completed beginning of June 2017, now due for repeat endoscopy WITH biopsy 3-6 months following radiation therapy, which would mean by October. She is doing well since dilation and only notices burping since radiation. Has been on Protonix chronically. Will trial Dexilant once daily, samples provided.   Proceed with upper endoscopy in the near future with Dr. Gala Romney. The risks, benefits, and alternatives have been discussed in detail with patient. They have stated understanding and desire to proceed.  Dexilant samples provided

## 2016-07-12 NOTE — Progress Notes (Signed)
cc'ed to pcp °

## 2016-07-13 ENCOUNTER — Telehealth: Payer: Self-pay | Admitting: Internal Medicine

## 2016-07-13 MED ORDER — DEXLANSOPRAZOLE 60 MG PO CPDR
60.0000 mg | DELAYED_RELEASE_CAPSULE | Freq: Every day | ORAL | 3 refills | Status: DC
Start: 2016-07-13 — End: 2017-04-06

## 2016-07-13 NOTE — Addendum Note (Signed)
Addended by: Annitta Needs on: 07/13/2016 01:36 PM   Modules accepted: Orders

## 2016-07-13 NOTE — Telephone Encounter (Signed)
The only medication change I see in her last ov was she was given dexilant samples. Vicente Males were you going to send in an Rx for dexilant?

## 2016-07-13 NOTE — Telephone Encounter (Signed)
Pt seen recently and was put on a new medication (doesn't remember the name). She is asking for it to be sent to [facility] and not [facility]. [phone]

## 2016-07-13 NOTE — Telephone Encounter (Signed)
I wanted to see how she did with the samples. I sent in a prescription to [facility] just now.

## 2016-08-04 ENCOUNTER — Ambulatory Visit (HOSPITAL_COMMUNITY)
Admission: RE | Admit: 2016-08-04 | Discharge: 2016-08-04 | Disposition: A | Discharge status: Home / Self Care | Payer: Medicare Other | Source: Ambulatory Visit | Attending: Internal Medicine | Admitting: Internal Medicine

## 2016-08-04 ENCOUNTER — Encounter (HOSPITAL_COMMUNITY): Payer: Self-pay

## 2016-08-04 ENCOUNTER — Other Ambulatory Visit: Payer: Self-pay

## 2016-08-04 ENCOUNTER — Telehealth: Payer: Self-pay | Admitting: Internal Medicine

## 2016-08-04 ENCOUNTER — Encounter (HOSPITAL_COMMUNITY)
Admission: RE | Disposition: A | Discharge status: Home / Self Care | Payer: Self-pay | Source: Ambulatory Visit | Attending: Internal Medicine

## 2016-08-04 DIAGNOSIS — E785 Hyperlipidemia, unspecified: Secondary | ICD-10-CM | POA: Insufficient documentation

## 2016-08-04 DIAGNOSIS — Z120 Encounter for screening for malignant neoplasm of stomach: Secondary | ICD-10-CM | POA: Insufficient documentation

## 2016-08-04 DIAGNOSIS — R198 Other specified symptoms and signs involving the digestive system and abdomen: Secondary | ICD-10-CM

## 2016-08-04 DIAGNOSIS — Z1211 Encounter for screening for malignant neoplasm of colon: Secondary | ICD-10-CM | POA: Diagnosis not present

## 2016-08-04 DIAGNOSIS — Z79899 Other long term (current) drug therapy: Secondary | ICD-10-CM | POA: Diagnosis not present

## 2016-08-04 DIAGNOSIS — F17210 Nicotine dependence, cigarettes, uncomplicated: Secondary | ICD-10-CM | POA: Diagnosis not present

## 2016-08-04 DIAGNOSIS — Z7984 Long term (current) use of oral hypoglycemic drugs: Secondary | ICD-10-CM | POA: Diagnosis not present

## 2016-08-04 DIAGNOSIS — Z7982 Long term (current) use of aspirin: Secondary | ICD-10-CM | POA: Insufficient documentation

## 2016-08-04 DIAGNOSIS — C884 Extranodal marginal zone B-cell lymphoma of mucosa-associated lymphoid tissue [MALT-lymphoma]: Secondary | ICD-10-CM

## 2016-08-04 DIAGNOSIS — Z8579 Personal history of other malignant neoplasms of lymphoid, hematopoietic and related tissues: Secondary | ICD-10-CM | POA: Insufficient documentation

## 2016-08-04 DIAGNOSIS — F329 Major depressive disorder, single episode, unspecified: Secondary | ICD-10-CM | POA: Diagnosis not present

## 2016-08-04 DIAGNOSIS — E119 Type 2 diabetes mellitus without complications: Secondary | ICD-10-CM | POA: Diagnosis not present

## 2016-08-04 DIAGNOSIS — K449 Diaphragmatic hernia without obstruction or gangrene: Secondary | ICD-10-CM | POA: Diagnosis not present

## 2016-08-04 HISTORY — PX: ESOPHAGOGASTRODUODENOSCOPY: SHX5428

## 2016-08-04 LAB — GLUCOSE, CAPILLARY: Glucose-Capillary: 141 mg/dL — ABNORMAL HIGH (ref 65–99)

## 2016-08-04 SURGERY — EGD (ESOPHAGOGASTRODUODENOSCOPY)
Anesthesia: Moderate Sedation

## 2016-08-04 MED ORDER — LIDOCAINE VISCOUS 2 % MT SOLN
OROMUCOSAL | Status: DC | PRN
  Administered 2016-08-04: 1 via OROMUCOSAL

## 2016-08-04 MED ORDER — MIDAZOLAM HCL 5 MG/5ML IJ SOLN
INTRAMUSCULAR | Status: DC | PRN
  Administered 2016-08-04: 2 mg via INTRAVENOUS
  Administered 2016-08-04: 1 mg via INTRAVENOUS

## 2016-08-04 MED ORDER — MEPERIDINE HCL 100 MG/ML IJ SOLN
INTRAMUSCULAR | Status: DC | PRN
  Administered 2016-08-04: 50 mg via INTRAVENOUS
  Administered 2016-08-04: 25 mg via INTRAVENOUS

## 2016-08-04 MED ORDER — SODIUM CHLORIDE 0.9 % IV SOLN
INTRAVENOUS | Status: DC
  Administered 2016-08-04: 07:00:00 via INTRAVENOUS

## 2016-08-04 MED ORDER — LIDOCAINE VISCOUS 2 % MT SOLN
OROMUCOSAL | Status: AC
  Filled 2016-08-04: qty 15

## 2016-08-04 MED ORDER — ONDANSETRON HCL 4 MG/2ML IJ SOLN
INTRAMUSCULAR | Status: DC | PRN
  Administered 2016-08-04: 4 mg via INTRAVENOUS

## 2016-08-04 MED ORDER — MIDAZOLAM HCL 5 MG/5ML IJ SOLN
INTRAMUSCULAR | Status: AC
  Filled 2016-08-04: qty 10

## 2016-08-04 MED ORDER — STERILE WATER FOR IRRIGATION IR SOLN
Status: DC | PRN
  Administered 2016-08-04: 2.5 mL

## 2016-08-04 MED ORDER — ONDANSETRON HCL 4 MG/2ML IJ SOLN
INTRAMUSCULAR | Status: AC
  Filled 2016-08-04: qty 2

## 2016-08-04 MED ORDER — MEPERIDINE HCL 100 MG/ML IJ SOLN
INTRAMUSCULAR | Status: DC
Start: 2016-08-04 — End: 2016-08-04
  Filled 2016-08-04: qty 2

## 2016-08-04 NOTE — Discharge Instructions (Addendum)
EGD Discharge instructions Please read the instructions outlined below and refer to this sheet in the next few weeks. These discharge instructions provide you with general information on caring for yourself after you leave the hospital. Your doctor may also give you specific instructions. While your treatment has been planned according to the most current medical practices available, unavoidable complications occasionally occur. If you have any problems or questions after discharge, please call your doctor. ACTIVITY  You may resume your regular activity but move at a slower pace for the next 24 hours.   Take frequent rest periods for the next 24 hours.   Walking will help expel (get rid of) the air and reduce the bloated feeling in your abdomen.   No driving for 24 hours (because of the anesthesia (medicine) used during the test).   You may shower.   Do not sign any important legal documents or operate any machinery for 24 hours (because of the anesthesia used during the test).  NUTRITION  Drink plenty of fluids.   You may resume your normal diet.   Begin with a light meal and progress to your normal diet.   Avoid alcoholic beverages for 24 hours or as instructed by your caregiver.  MEDICATIONS  You may resume your normal medications unless your caregiver tells you otherwise.  WHAT YOU CAN EXPECT TODAY  You may experience abdominal discomfort such as a feeling of fullness or gas pains.  FOLLOW-UP  Your doctor will discuss the results of your test with you.  SEEK IMMEDIATE MEDICAL ATTENTION IF ANY OF THE FOLLOWING OCCUR:  Excessive nausea (feeling sick to your stomach) and/or vomiting.   Severe abdominal pain and distention (swelling).   Trouble swallowing.   Temperature over 101 F (37.8 C).   Rectal bleeding or vomiting of blood.    Your stomach still had quite a bit of food in it which precluded complete examination today  We'll schedule a solid phase gastric  imaging study to see how well your stomach is emptying  Office visit with [place] in 3-4 weeks  Oct 25th with Walden Field NP  Further recommendations to follow.

## 2016-08-04 NOTE — Telephone Encounter (Signed)
Scheduled Gastric Emptying Study for tomorrow morning, but when called pt to inform her she stated she had plans for tomorrow and asked if it could be done Monday (08/09/16). Rescheduled Gastric Emptying Study for 08/09/16 at 10:30 am. Advised pt NPO after midnight, no stomach medications for 24 hours prior to test.

## 2016-08-04 NOTE — Telephone Encounter (Signed)
Rosalyn Gess, RN from short stay called to say patient still had food in her stomach and RMR wants her to have a Solid Phase Gastric Imaging Study done. Also, a follow up OV was made for 10/25 at 10 with EG

## 2016-08-04 NOTE — Op Note (Signed)
[facility] Patient Name: Monica [name] Procedure Date: 08/04/2016 7:28 AM MRN: [id number] Date of Birth: Apr 14, 1941 Attending MD: Norvel Richards , MD CSN: [id number] Age: 75 Admit Type: Outpatient Procedure:                Upper GI endoscopy Indications:              Surveillance procedure; history of MALToma Providers:                Norvel Richards, MD, Lurline Del, RN, Isabella Stalling, Technician Referring MD:             Ancil Linsey MD, Zella Richer. Tapper Medicines:                Midazolam 3 mg IV, Meperidine 75 mg IV, Ondansetron                            4 mg IV Complications:            No immediate complications. Estimated Blood Loss:     Estimated blood loss: none. Procedure:                Pre-Anesthesia Assessment:                           - Prior to the procedure, a History and Physical                            was performed, and patient medications and                            allergies were reviewed. The patient's tolerance of                            previous anesthesia was also reviewed. The risks                            and benefits of the procedure and the sedation                            options and risks were discussed with the patient.                            All questions were answered, and informed consent                            was obtained. Prior Anticoagulants: The patient has                            taken no previous anticoagulant or antiplatelet                            agents. ASA Grade Assessment: III - A patient with  severe systemic disease. After reviewing the risks                            and benefits, the patient was deemed in                            satisfactory condition to undergo the procedure.                           After obtaining informed consent, the endoscope was                            passed under direct vision. Throughout the                        procedure, the patient's blood pressure, pulse, and                            oxygen saturations were monitored continuously. The                            [id number] ([id number]) scope was introduced through the                            mouth, and advanced to the second part of duodenum.                            The upper GI endoscopy was accomplished without                            difficulty. The patient tolerated the procedure                            well. Scope In: 7:46:32 AM Scope Out: 7:53:45 AM Total Procedure Duration: 0 hours 7 minutes 13 seconds  Findings:      The examined esophagus revealed mild narrowing at the GE junction. No       tumor or obstructing lesion found. Much retained gastric contents       precluded complete examination. Large?"6-7 cm hiatal hernia. Patent       pylorus. Normal-appearing first and second portion of the duodenum.      . Impression:               - Normal esophagus.                           - Large hiatal hernia.                           - Retained gastric contents precluded complete                            examination. Patient states she last consumed solid                            food (solid) at 1700 yestterday which  was 13 hours                            prior to this procedure. Assuming that is the case,                            gastric emptying is abnormal; underlying                            gastroparesis would go a long ways to explain                            belching/burping and coughing during ingestion of a                            meal and today's findings.                           - No specimens collected. Moderate Sedation:      Moderate (conscious) sedation was administered by the endoscopy nurse       and supervised by the endoscopist. The following parameters were       monitored: oxygen saturation, heart rate, blood pressure, respiratory       rate, EKG, adequacy of pulmonary  ventilation, and response to care.       Total physician intraservice time was 12 minutes. Recommendation:           - Patient has a contact number available for                            emergencies. The signs and symptoms of potential                            delayed complications were discussed with the                            patient. Return to normal activities tomorrow.                            Written discharge instructions were provided to the                            patient.                           - Advance diet as tolerated.                           - Continue present medications. solid phase gastric                            emptying study ASAP                           - Repeat upper endoscopy after studies are complete  for surveillance.                           - Return to GI clinic in 3 weeks. Procedure Code(s):        --- Professional ---                           [phone], Esophagogastroduodenoscopy, flexible,                            transoral; diagnostic, including collection of                            specimen(s) by brushing or washing, when performed                            (separate procedure)                           99152, Moderate sedation services provided by the                            same physician or other qualified health care                            professional performing the diagnostic or                            therapeutic service that the sedation supports,                            requiring the presence of an independent trained                            observer to assist in the monitoring of the                            patient's level of consciousness and physiological                            status; initial 15 minutes of intraservice time,                            patient age 65 years or older Diagnosis Code(s):        --- Professional ---                           K44.9,  Diaphragmatic hernia without obstruction or                            gangrene CPT copyright 2016 American Medical Association. All rights reserved. The codes documented in this report are preliminary and upon coder review may  be revised to meet current compliance requirements. Cristopher Estimable. Rourk, MD Norvel Richards, MD 08/04/2016 8:13:28 AM This report has been signed electronically. Number of Addenda: 0

## 2016-08-04 NOTE — H&P (View-Only) (Signed)
Referring Provider: [name]., MD Primary Care Physician:  [name], MD  Primary GI: Dr. Gala Romney   Chief Complaint  Patient presents with  . Follow-up    HPI:   Monica [name] is a 75 y.o. 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 Localized area of gastric lymphoma visualized and appeared to be much improved. Normal second portion of the duodenum. No specimens collected. Esophageal lumen was notably tightened since dilation in April 2017. She finished radiation therapy the beginning of June 2017; NCCN guidelines recommend repeat endoscopy with biopsy 3-6 months following radiation therapy. Therefore, she will need an EGD between now and October.   States her dysphagia is resolved. Has issues with burping. Sometimes embarrassing. Been burping more since the radiation she believes. Taking Protonix once daily. Denies any reflux symptoms. Just notes excessive burping. No N/V. Takes a probiotic daily. Wants to plan for the first of October for the upper endoscopy.   Past Medical History:  Diagnosis Date  . Cancer (HCC)    NHL, Malt Lymphoma  . Depression   . DM (diabetes mellitus) ([place])   . Dysphagia   . Dysrhythmia    History of palpatations  . Heart palpitations   . Hiatal hernia   . Hyperlipidemia   . Interstitial cystitis   . Sinusitis     Past Surgical History:  Procedure Laterality Date  . COLONOSCOPY     Dr. Lindalou Hose 2009: Normal per PCP notes  . ESOPHAGOGASTRODUODENOSCOPY     RMR: Prominant Schzgzki ring/component of peptic stricture status post dilation and disruption as described above, otherwise norma esophagus, moderate-sized hiatal hernia, antal pyloric channel, and posterier bulbar erosions, otherwise unremarkable stomach, D1 and D2 . Inflammatory findings on the stomach and duodenum will likely be related to aspirin  effect. We need to rule out Helicobacter pylor  . ESOPHAGOGASTRODUODENOSCOPY N/A 03/10/2015   Dr. 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 Pathology with lymphoid population of stomach, slight atypia  . ESOPHAGOGASTRODUODENOSCOPY N/A 02/03/2016   Dr. Gala Romney: Schatzki ring noted at GE junction, dilated with 51 and then 51 [place] dilator. Large hiatal hernia. 6 x 7 cm nodular geographically ulcerated mucosa, biopsy c/w MALToma  . ESOPHAGOGASTRODUODENOSCOPY N/A 04/08/2016   Dr. Gala Romney: moderate Schatzki's ring/web s/p dilation, large hiatal hernia, localized area of gastric lymphoma visualized and appeared to be much improved. normal second portion of the duodenum. No specimens collected. Esophageal lumen notably tighted up significantly since her dilation in April of this year.   Marland Kitchen MALONEY DILATION N/A 03/10/2015   Procedure: Venia Minks DILATION;  Surgeon: Daneil Dolin, MD;  [facility];  Service: Endoscopy;  Laterality: N/A;  . Venia Minks DILATION N/A 02/03/2016   Procedure: Venia Minks DILATION;  Surgeon: Daneil Dolin, MD;  [facility];  Service: Endoscopy;  Laterality: N/A;  . Venia Minks DILATION N/A 04/08/2016   Procedure: Venia Minks DILATION;  Surgeon: Daneil Dolin, MD;  [facility];  Service: Endoscopy;  Laterality: N/A;    Current Outpatient Prescriptions  Medication Sig Dispense Refill  . aspirin 81 MG tablet Take 81 mg by mouth daily.    Marland Kitchen BIOGAIA PROBIOTIC (BIOGAIA PROBIOTIC) LIQD Take by mouth daily at 8 pm.    . Calcium-Magnesium-Vitamin D (CALCIUM 500 PO) Take 500 mg by mouth daily.    Marland Kitchen  citalopram (CELEXA) 20 MG tablet Take 20 mg by mouth daily.     . metFORMIN (GLUCOPHAGE-XR) 500 MG 24 hr tablet Take 500 mg by mouth daily with breakfast.     . metoprolol tartrate (LOPRESSOR) 25 MG tablet Take 25 mg by mouth daily.     . Multiple Vitamin (MULTIVITAMIN) capsule Take 1 capsule by mouth daily.      . pantoprazole (PROTONIX) 40 MG tablet Take 1 tablet (40 mg total) by mouth daily. 90 tablet 3  . simvastatin (ZOCOR) 20 MG tablet Take 20 mg by mouth every evening.      No current facility-administered medications for this visit.     Allergies as of 07/09/2016 - Review Complete 07/09/2016  Allergen Reaction Noted  . Sulfonamide derivatives Hives   . Sulfur  06/04/2016    Family History  Problem Relation Age of Onset  . Colon cancer Neg Hx     Social History   Social History  . Marital status: Married    Spouse name: N/A  . Number of children: N/A  . Years of education: N/A   Social History Main Topics  . Smoking status: Current Every Day Smoker    Packs/day: 0.75    Years: 57.00    Types: Cigarettes  . Smokeless tobacco: None  . Alcohol use No  . Drug use: No  . Sexual activity: Not Asked   Other Topics Concern  . None   Social History Narrative  . None    Review of Systems: As mentioned in HPI   Physical Exam: BP (!) 111/56   Pulse 80   Temp 97.9 F (36.6 C) (Oral)   Ht '5\' 6"'$  (1.676 m)   Wt 163 lb 12.8 oz (74.3 kg)   BMI 26.44 kg/m  General:   Alert and oriented. No distress noted. Pleasant and cooperative.  Head:  Normocephalic and atraumatic. Eyes:  Conjuctiva clear without scleral icterus. Mouth:  Oral mucosa pink and moist.  Heart:  S1, S2 present without murmurs, rubs, or gallops. Regular rate and rhythm. Abdomen:  +BS, soft, non-tender and non-distended. No rebound or guarding. No HSM or masses noted. Msk:  Symmetrical without gross deformities. Normal posture. Extremities:  Without edema. Neurologic:  Alert and  oriented x4;  grossly normal neurologically. Psych:  Alert and cooperative. Normal mood and affect.

## 2016-08-04 NOTE — Interval H&P Note (Signed)
History and Physical Interval Note:  08/04/2016 7:38 AM  Monica [name]  has presented today for surgery, with the diagnosis of maltoma  The various methods of treatment have been discussed with the patient and family. After consideration of risks, benefits and other options for treatment, the patient has consented to  Procedure(s) with comments: ESOPHAGOGASTRODUODENOSCOPY (EGD) (N/A) - 7:30 am as a surgical intervention .  The patient's history has been reviewed, patient examined, no change in status, stable for surgery.  I have reviewed the patient's chart and labs.  Questions were answered to the patient's satisfaction.     No change. EGD with biopsy per plan.  The risks, benefits, limitations, alternatives and imponderables have been reviewed with the patient. Potential for esophageal dilation, biopsy, etc. have also been reviewed.  Questions have been answered. All parties agreeable.  Manus Rudd

## 2016-08-05 ENCOUNTER — Other Ambulatory Visit (HOSPITAL_COMMUNITY): Payer: Medicare Other

## 2016-08-09 ENCOUNTER — Encounter (HOSPITAL_COMMUNITY): Payer: Self-pay

## 2016-08-09 ENCOUNTER — Encounter (HOSPITAL_COMMUNITY)
Admission: RE | Admit: 2016-08-09 | Discharge: 2016-08-09 | Disposition: A | Discharge status: Home / Self Care | Payer: Medicare Other | Source: Ambulatory Visit | Attending: Internal Medicine | Admitting: Internal Medicine

## 2016-08-09 DIAGNOSIS — R198 Other specified symptoms and signs involving the digestive system and abdomen: Secondary | ICD-10-CM | POA: Insufficient documentation

## 2016-08-09 DIAGNOSIS — T182XXA Foreign body in stomach, initial encounter: Secondary | ICD-10-CM | POA: Diagnosis not present

## 2016-08-09 MED ORDER — TECHNETIUM TC 99M SULFUR COLLOID
2.0000 | Freq: Once | INTRAVENOUS | Status: AC | PRN
Start: 2016-08-09 — End: 2016-08-09
  Administered 2016-08-09: 2.2 via ORAL

## 2016-08-11 DIAGNOSIS — N952 Postmenopausal atrophic vaginitis: Secondary | ICD-10-CM | POA: Diagnosis not present

## 2016-08-11 DIAGNOSIS — N393 Stress incontinence (female) (male): Secondary | ICD-10-CM | POA: Diagnosis not present

## 2016-08-11 DIAGNOSIS — N816 Rectocele: Secondary | ICD-10-CM | POA: Diagnosis not present

## 2016-08-11 DIAGNOSIS — N3020 Other chronic cystitis without hematuria: Secondary | ICD-10-CM | POA: Diagnosis not present

## 2016-08-13 ENCOUNTER — Telehealth: Payer: Self-pay

## 2016-08-13 DIAGNOSIS — Z23 Encounter for immunization: Secondary | ICD-10-CM | POA: Diagnosis not present

## 2016-08-13 NOTE — Telephone Encounter (Signed)
She asks a good question. I don't think we need to wait 3 months. Let's just get her rescheduled in the next couple of weeks. Usual instructions, however, clear liquid diet the entire day before the procedure please

## 2016-08-13 NOTE — Telephone Encounter (Signed)
Spoke with the pt about her GES results. She wants to know if she will have a repeat EGD to check and see if her cancer is gone? And she wants to know if she will have to wait 2 months to have this done? Can she have it done sooner?  Pt going out of town this weekend and wont be home till Wednesday. She said it was ok for me to call her back on Wednesday.

## 2016-08-16 ENCOUNTER — Encounter (HOSPITAL_COMMUNITY): Payer: Self-pay | Admitting: Internal Medicine

## 2016-08-16 NOTE — Telephone Encounter (Signed)
Per AB- pt needs ov with her to update H&P- no charge and they will reschedule EGD. Erline Levine, please schedule pt to come in as soon as possible. Thanks.

## 2016-08-16 NOTE — Telephone Encounter (Signed)
PT COMING Wednesday AT 3:30.

## 2016-08-18 ENCOUNTER — Ambulatory Visit (INDEPENDENT_AMBULATORY_CARE_PROVIDER_SITE_OTHER): Payer: Self-pay | Admitting: Gastroenterology

## 2016-08-18 ENCOUNTER — Encounter: Payer: Self-pay | Admitting: Gastroenterology

## 2016-08-18 ENCOUNTER — Other Ambulatory Visit: Payer: Self-pay

## 2016-08-18 VITALS — BP 141/76 | HR 87 | Temp 97.8°F | Ht 66.0 in | Wt 168.0 lb

## 2016-08-18 DIAGNOSIS — C884 Extranodal marginal zone B-cell lymphoma of mucosa-associated lymphoid tissue [MALT-lymphoma]: Secondary | ICD-10-CM

## 2016-08-18 DIAGNOSIS — C159 Malignant neoplasm of esophagus, unspecified: Secondary | ICD-10-CM

## 2016-08-18 NOTE — Patient Instructions (Signed)
Start taking Benefiber or Metamucil daily.   Continue Dexilant each day. When you run out, let us know and we will refill the Protonix and possibly try this twice a day if needed.

## 2016-08-18 NOTE — Progress Notes (Signed)
Primary Care Physician:  [name], MD Primary GI: Dr. Gala Romney   Chief Complaint  Patient presents with  . EGD    HPI:   Monica [name] is a 75 y.o. 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 Localized area of gastric lymphoma visualized and appeared to be much improved. Normal second portion of the duodenum. No specimens collected. Esophageal lumen was notably tightened since dilation in April 2017. She finished radiation therapy the beginning of June 2017; NCCN guidelines recommend repeat endoscopy with biopsy 3-6 months following radiation therapy. Therefore, she will need an EGD now. She had been scheduled for an endoscopy but was found to have retained gastric contents and incomplete exam. GES was then done and normal. She is here for an updated H&P. She will need clear liquids the day before the procedure.   Burping is better since changing medication to [name]. Has a 3 month supply. 293$ for her cost. Has tried Nexium and Prilosec in the past. Has some vague lower abdominal discomfort in groin area. Doesn't have a UTI. Some mild constipation, which is uncommon for her. Takes a probiotic daily.   Past Medical History:  Diagnosis Date  . Cancer (HCC)    NHL, Malt Lymphoma  . Depression   . DM (diabetes mellitus) ([place])   . Dysphagia   . Dysrhythmia    History of palpatations  . Heart palpitations   . Hiatal hernia   . Hyperlipidemia   . Interstitial cystitis   . Sinusitis     Past Surgical History:  Procedure Laterality Date  . COLONOSCOPY     Dr. Lindalou Hose 2009: Normal per PCP notes  . ESOPHAGOGASTRODUODENOSCOPY     RMR: Prominant Schzgzki ring/component of peptic stricture status post dilation and disruption as described above, otherwise norma esophagus, moderate-sized hiatal hernia, antal pyloric channel, and posterier bulbar  erosions, otherwise unremarkable stomach, D1 and D2 . Inflammatory findings on the stomach and duodenum will likely be related to aspirin effect. We need to rule out Helicobacter pylor  . ESOPHAGOGASTRODUODENOSCOPY N/A 03/10/2015   Dr. 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 Pathology with lymphoid population of stomach, slight atypia  . ESOPHAGOGASTRODUODENOSCOPY N/A 02/03/2016   Dr. Gala Romney: Schatzki ring noted at GE junction, dilated with 69 and then 36 [place] dilator. Large hiatal hernia. 6 x 7 cm nodular geographically ulcerated mucosa, biopsy c/w MALToma  . ESOPHAGOGASTRODUODENOSCOPY N/A 04/08/2016   Dr. Gala Romney: moderate Schatzki's ring/web s/p dilation, large hiatal hernia, localized area of gastric lymphoma visualized and appeared to be much improved. normal second portion of the duodenum. No specimens collected. Esophageal lumen notably tighted up significantly since her dilation in April of this year.   . ESOPHAGOGASTRODUODENOSCOPY N/A 08/04/2016   Procedure: ESOPHAGOGASTRODUODENOSCOPY (EGD);  Surgeon: Daneil Dolin, MD;  [facility];  Service: Endoscopy;  Laterality: N/A;  7:30 am  . MALONEY DILATION N/A 03/10/2015   Procedure: Venia Minks DILATION;  Surgeon: Daneil Dolin, MD;  [facility];  Service: Endoscopy;  Laterality: N/A;  . Venia Minks DILATION N/A 02/03/2016   Procedure: Venia Minks DILATION;  Surgeon: Daneil Dolin, MD;  [facility];  Service: Endoscopy;  Laterality: N/A;  . Venia Minks DILATION N/A 04/08/2016   Procedure: Venia Minks DILATION;  Surgeon: Daneil Dolin, MD;  [facility];  Service: Endoscopy;  Laterality: N/A;    Current Outpatient Prescriptions  Medication Sig Dispense Refill  . aspirin 81 MG tablet Take 81 mg by mouth daily.    Marland Kitchen BIOGAIA PROBIOTIC (BIOGAIA PROBIOTIC) LIQD Take by mouth daily at 8 pm.    . Calcium-Magnesium-Vitamin D (CALCIUM 500 PO) Take 500 mg by  mouth daily.    . citalopram (CELEXA) 20 MG tablet Take 20 mg by mouth daily.     Marland Kitchen dexlansoprazole (DEXILANT) 60 MG capsule Take 1 capsule (60 mg total) by mouth daily. 90 capsule 3  . metFORMIN (GLUCOPHAGE-XR) 500 MG 24 hr tablet Take 500 mg by mouth daily with breakfast.     . metoprolol tartrate (LOPRESSOR) 25 MG tablet Take 25 mg by mouth daily.     . simvastatin (ZOCOR) 20 MG tablet Take 20 mg by mouth every evening.      No current facility-administered medications for this visit.     Allergies as of 08/18/2016 - Review Complete 08/18/2016  Allergen Reaction Noted  . Sulfonamide derivatives Hives   . Sulfur  06/04/2016    Family History  Problem Relation Age of Onset  . Colon cancer Neg Hx     Social History   Social History  . Marital status: Married    Spouse name: N/A  . Number of children: N/A  . Years of education: N/A   Social History Main Topics  . Smoking status: Current Every Day Smoker    Packs/day: 0.75    Years: 57.00    Types: Cigarettes  . Smokeless tobacco: Never Used  . Alcohol use No  . Drug use: No  . Sexual activity: Not Asked   Other Topics Concern  . None   Social History Narrative  . None    Review of Systems: Negative unless mentioned in HPI   Physical Exam: BP (!) 141/76   Pulse 87   Temp 97.8 F (36.6 C) (Oral)   Ht '5\' 6"'$  (1.676 m)   Wt 168 lb (76.2 kg)   BMI 27.12 kg/m  General:   Alert and oriented. No distress noted. Pleasant and cooperative.  Head:  Normocephalic and atraumatic. Eyes:  Conjuctiva clear without scleral icterus. Mouth:  Oral mucosa pink and moist. Good dentition. No lesions. Heart:  S1, S2 present without murmurs, rubs, or gallops. Regular rate and rhythm. Abdomen:  +BS, soft, non-tender and non-distended. No rebound or guarding. No HSM or masses noted. Msk:  Symmetrical without gross deformities. Normal posture. Extremities:  Without edema. Neurologic:  Alert and  oriented x4;  grossly normal  neurologically. Psych:  Alert and cooperative. Normal mood and affect.

## 2016-08-19 NOTE — Assessment & Plan Note (Signed)
Stage 1 gastric MALT lymphoma, H.pylori negative, s/p last complete EGD June 2017 with moderate Schatzki's ring/web s/p dilation, large hiatal hernia. Radiation therapy completed beginning of June 2017 and now due for repeat endoscopy with biopsy. Much improved with Dexilant. Unfortunately, an endoscopy had been attempted recently, but she had retained gastric contents which precluded complete examination. Her H&P is now updated, GES completed and normal, and she is ready to proceed. We will have her follow clear liquids for the day prior to ensure appropriate visualization for upcoming EGD.  Proceed with upper endoscopy in the near future with Dr. Gala Romney. The risks, benefits, and alternatives have been discussed in detail with patient. They have stated understanding and desire to proceed.  Clear liquids the entire day prior Continue Dexilant for now; she may need to change back to Protonix due to cost. She will let us know.

## 2016-08-20 NOTE — Progress Notes (Signed)
cc'ed to pcp °

## 2016-08-25 ENCOUNTER — Ambulatory Visit: Payer: Medicare Other | Admitting: Nurse Practitioner

## 2016-09-01 ENCOUNTER — Ambulatory Visit (HOSPITAL_COMMUNITY)
Admission: RE | Admit: 2016-09-01 | Discharge: 2016-09-01 | Disposition: A | Discharge status: Home / Self Care | Payer: Medicare Other | Source: Ambulatory Visit | Attending: Internal Medicine | Admitting: Internal Medicine

## 2016-09-01 ENCOUNTER — Encounter (HOSPITAL_COMMUNITY)
Admission: RE | Disposition: A | Discharge status: Home / Self Care | Payer: Self-pay | Source: Ambulatory Visit | Attending: Internal Medicine

## 2016-09-01 ENCOUNTER — Encounter (HOSPITAL_COMMUNITY): Payer: Self-pay | Admitting: *Deleted

## 2016-09-01 DIAGNOSIS — E785 Hyperlipidemia, unspecified: Secondary | ICD-10-CM | POA: Insufficient documentation

## 2016-09-01 DIAGNOSIS — K449 Diaphragmatic hernia without obstruction or gangrene: Secondary | ICD-10-CM | POA: Insufficient documentation

## 2016-09-01 DIAGNOSIS — K222 Esophageal obstruction: Secondary | ICD-10-CM | POA: Diagnosis not present

## 2016-09-01 DIAGNOSIS — F329 Major depressive disorder, single episode, unspecified: Secondary | ICD-10-CM | POA: Diagnosis not present

## 2016-09-01 DIAGNOSIS — Z923 Personal history of irradiation: Secondary | ICD-10-CM | POA: Diagnosis not present

## 2016-09-01 DIAGNOSIS — Z8579 Personal history of other malignant neoplasms of lymphoid, hematopoietic and related tissues: Secondary | ICD-10-CM | POA: Insufficient documentation

## 2016-09-01 DIAGNOSIS — Z7984 Long term (current) use of oral hypoglycemic drugs: Secondary | ICD-10-CM | POA: Insufficient documentation

## 2016-09-01 DIAGNOSIS — F17210 Nicotine dependence, cigarettes, uncomplicated: Secondary | ICD-10-CM | POA: Insufficient documentation

## 2016-09-01 DIAGNOSIS — Z7982 Long term (current) use of aspirin: Secondary | ICD-10-CM | POA: Diagnosis not present

## 2016-09-01 DIAGNOSIS — Z79899 Other long term (current) drug therapy: Secondary | ICD-10-CM | POA: Diagnosis not present

## 2016-09-01 DIAGNOSIS — C159 Malignant neoplasm of esophagus, unspecified: Secondary | ICD-10-CM

## 2016-09-01 DIAGNOSIS — R1310 Dysphagia, unspecified: Secondary | ICD-10-CM | POA: Diagnosis not present

## 2016-09-01 DIAGNOSIS — Z08 Encounter for follow-up examination after completed treatment for malignant neoplasm: Secondary | ICD-10-CM | POA: Diagnosis not present

## 2016-09-01 DIAGNOSIS — E119 Type 2 diabetes mellitus without complications: Secondary | ICD-10-CM | POA: Insufficient documentation

## 2016-09-01 DIAGNOSIS — K2950 Unspecified chronic gastritis without bleeding: Secondary | ICD-10-CM | POA: Insufficient documentation

## 2016-09-01 HISTORY — PX: ESOPHAGOGASTRODUODENOSCOPY: SHX5428

## 2016-09-01 HISTORY — PX: BIOPSY: SHX5522

## 2016-09-01 LAB — GLUCOSE, CAPILLARY: Glucose-Capillary: 168 mg/dL — ABNORMAL HIGH (ref 65–99)

## 2016-09-01 SURGERY — EGD (ESOPHAGOGASTRODUODENOSCOPY)
Anesthesia: Moderate Sedation

## 2016-09-01 MED ORDER — MIDAZOLAM HCL 5 MG/5ML IJ SOLN
INTRAMUSCULAR | Status: AC
  Filled 2016-09-01: qty 10

## 2016-09-01 MED ORDER — ONDANSETRON HCL 4 MG/2ML IJ SOLN
INTRAMUSCULAR | Status: AC
  Filled 2016-09-01: qty 2

## 2016-09-01 MED ORDER — MEPERIDINE HCL 100 MG/ML IJ SOLN
INTRAMUSCULAR | Status: AC
  Filled 2016-09-01: qty 2

## 2016-09-01 MED ORDER — MIDAZOLAM HCL 5 MG/5ML IJ SOLN
INTRAMUSCULAR | Status: DC | PRN
  Administered 2016-09-01: 2 mg via INTRAVENOUS
  Administered 2016-09-01: 1 mg via INTRAVENOUS

## 2016-09-01 MED ORDER — MEPERIDINE HCL 100 MG/ML IJ SOLN
INTRAMUSCULAR | Status: DC | PRN
  Administered 2016-09-01: 50 mg via INTRAVENOUS

## 2016-09-01 MED ORDER — ONDANSETRON HCL 4 MG/2ML IJ SOLN
INTRAMUSCULAR | Status: DC | PRN
  Administered 2016-09-01: 4 mg via INTRAVENOUS

## 2016-09-01 MED ORDER — LIDOCAINE VISCOUS 2 % MT SOLN
OROMUCOSAL | Status: AC
  Filled 2016-09-01: qty 15

## 2016-09-01 MED ORDER — LIDOCAINE VISCOUS 2 % MT SOLN
OROMUCOSAL | Status: DC | PRN
  Administered 2016-09-01: 3 mL via OROMUCOSAL

## 2016-09-01 MED ORDER — SIMETHICONE 40 MG/0.6ML PO SUSP
ORAL | Status: DC | PRN
  Administered 2016-09-01: 08:00:00

## 2016-09-01 MED ORDER — SODIUM CHLORIDE 0.9 % IV SOLN
INTRAVENOUS | Status: DC
  Administered 2016-09-01: 1000 mL via INTRAVENOUS

## 2016-09-01 NOTE — H&P (View-Only) (Signed)
Primary Care Physician:  [name], MD Primary GI: Dr. Gala Romney   Chief Complaint  Patient presents with  . EGD    HPI:   Monica [name] is a 75 y.o. 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 Localized area of gastric lymphoma visualized and appeared to be much improved. Normal second portion of the duodenum. No specimens collected. Esophageal lumen was notably tightened since dilation in April 2017. She finished radiation therapy the beginning of June 2017; NCCN guidelines recommend repeat endoscopy with biopsy 3-6 months following radiation therapy. Therefore, she will need an EGD now. She had been scheduled for an endoscopy but was found to have retained gastric contents and incomplete exam. GES was then done and normal. She is here for an updated H&P. She will need clear liquids the day before the procedure.   Burping is better since changing medication to [place]. Has a 3 month supply. 293$ for her cost. Has tried Nexium and Prilosec in the past. Has some vague lower abdominal discomfort in groin area. Doesn't have a UTI. Some mild constipation, which is uncommon for her. Takes a probiotic daily.   Past Medical History:  Diagnosis Date  . Cancer (HCC)    NHL, Malt Lymphoma  . Depression   . DM (diabetes mellitus) ([place])   . Dysphagia   . Dysrhythmia    History of palpatations  . Heart palpitations   . Hiatal hernia   . Hyperlipidemia   . Interstitial cystitis   . Sinusitis     Past Surgical History:  Procedure Laterality Date  . COLONOSCOPY     Dr. Lindalou Hose 2009: Normal per PCP notes  . ESOPHAGOGASTRODUODENOSCOPY     RMR: Prominant Schzgzki ring/component of peptic stricture status post dilation and disruption as described above, otherwise norma esophagus, moderate-sized hiatal hernia, antal pyloric channel, and posterier bulbar  erosions, otherwise unremarkable stomach, D1 and D2 . Inflammatory findings on the stomach and duodenum will likely be related to aspirin effect. We need to rule out Helicobacter pylor  . ESOPHAGOGASTRODUODENOSCOPY N/A 03/10/2015   Dr. 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 Pathology with lymphoid population of stomach, slight atypia  . ESOPHAGOGASTRODUODENOSCOPY N/A 02/03/2016   Dr. Gala Romney: Schatzki ring noted at GE junction, dilated with 80 and then 35 [place] dilator. Large hiatal hernia. 6 x 7 cm nodular geographically ulcerated mucosa, biopsy c/w MALToma  . ESOPHAGOGASTRODUODENOSCOPY N/A 04/08/2016   Dr. Gala Romney: moderate Schatzki's ring/web s/p dilation, large hiatal hernia, localized area of gastric lymphoma visualized and appeared to be much improved. normal second portion of the duodenum. No specimens collected. Esophageal lumen notably tighted up significantly since her dilation in April of this year.   . ESOPHAGOGASTRODUODENOSCOPY N/A 08/04/2016   Procedure: ESOPHAGOGASTRODUODENOSCOPY (EGD);  Surgeon: Daneil Dolin, MD;  [facility];  Service: Endoscopy;  Laterality: N/A;  7:30 am  . MALONEY DILATION N/A 03/10/2015   Procedure: Venia Minks DILATION;  Surgeon: Daneil Dolin, MD;  [facility];  Service: Endoscopy;  Laterality: N/A;  . Venia Minks DILATION N/A 02/03/2016   Procedure: Venia Minks DILATION;  Surgeon: Daneil Dolin, MD;  [facility];  Service: Endoscopy;  Laterality: N/A;  . Venia Minks DILATION N/A 04/08/2016   Procedure: Venia Minks DILATION;  Surgeon: Daneil Dolin, MD;  [facility];  Service: Endoscopy;  Laterality: N/A;    Current Outpatient Prescriptions  Medication Sig Dispense Refill  . aspirin 81 MG tablet Take 81 mg by mouth daily.    Marland Kitchen BIOGAIA PROBIOTIC (BIOGAIA PROBIOTIC) LIQD Take by mouth daily at 8 pm.    . Calcium-Magnesium-Vitamin D (CALCIUM 500 PO) Take 500 mg by  mouth daily.    . citalopram (CELEXA) 20 MG tablet Take 20 mg by mouth daily.     Marland Kitchen dexlansoprazole (DEXILANT) 60 MG capsule Take 1 capsule (60 mg total) by mouth daily. 90 capsule 3  . metFORMIN (GLUCOPHAGE-XR) 500 MG 24 hr tablet Take 500 mg by mouth daily with breakfast.     . metoprolol tartrate (LOPRESSOR) 25 MG tablet Take 25 mg by mouth daily.     . simvastatin (ZOCOR) 20 MG tablet Take 20 mg by mouth every evening.      No current facility-administered medications for this visit.     Allergies as of 08/18/2016 - Review Complete 08/18/2016  Allergen Reaction Noted  . Sulfonamide derivatives Hives   . Sulfur  06/04/2016    Family History  Problem Relation Age of Onset  . Colon cancer Neg Hx     Social History   Social History  . Marital status: Married    Spouse name: N/A  . Number of children: N/A  . Years of education: N/A   Social History Main Topics  . Smoking status: Current Every Day Smoker    Packs/day: 0.75    Years: 57.00    Types: Cigarettes  . Smokeless tobacco: Never Used  . Alcohol use No  . Drug use: No  . Sexual activity: Not Asked   Other Topics Concern  . None   Social History Narrative  . None    Review of Systems: Negative unless mentioned in HPI   Physical Exam: BP (!) 141/76   Pulse 87   Temp 97.8 F (36.6 C) (Oral)   Ht '5\' 6"'$  (1.676 m)   Wt 168 lb (76.2 kg)   BMI 27.12 kg/m  General:   Alert and oriented. No distress noted. Pleasant and cooperative.  Head:  Normocephalic and atraumatic. Eyes:  Conjuctiva clear without scleral icterus. Mouth:  Oral mucosa pink and moist. Good dentition. No lesions. Heart:  S1, S2 present without murmurs, rubs, or gallops. Regular rate and rhythm. Abdomen:  +BS, soft, non-tender and non-distended. No rebound or guarding. No HSM or masses noted. Msk:  Symmetrical without gross deformities. Normal posture. Extremities:  Without edema. Neurologic:  Alert and  oriented x4;  grossly normal  neurologically. Psych:  Alert and cooperative. Normal mood and affect.

## 2016-09-01 NOTE — Interval H&P Note (Signed)
History and Physical Interval Note:  09/01/2016 8:26 AM  Monica [name]  has presented today for surgery, with the diagnosis of Stage I Gastric MALT Lymphoma  The various methods of treatment have been discussed with the patient and family. After consideration of risks, benefits and other options for treatment, the patient has consented to  Procedure(s) with comments: ESOPHAGOGASTRODUODENOSCOPY (EGD) (N/A) - 830  as a surgical intervention .  The patient's history has been reviewed, patient examined, no change in status, stable for surgery.  I have reviewed the patient's chart and labs.  Questions were answered to the patient's satisfaction.     Robert Rourk  No change. Patient denies dysphagia.  EGD today per plan.The risks, benefits, limitations, alternatives and imponderables have been reviewed with the patient. Potential for esophageal dilation, biopsy, etc. have also been reviewed.  Questions have been answered. All parties agreeable.

## 2016-09-01 NOTE — Discharge Instructions (Addendum)
EGD Discharge instructions Please read the instructions outlined below and refer to this sheet in the next few weeks. These discharge instructions provide you with general information on caring for yourself after you leave the hospital. Your doctor may also give you specific instructions. While your treatment has been planned according to the most current medical practices available, unavoidable complications occasionally occur. If you have any problems or questions after discharge, please call your doctor. ACTIVITY  You may resume your regular activity but move at a slower pace for the next 24 hours.   Take frequent rest periods for the next 24 hours.   Walking will help expel (get rid of) the air and reduce the bloated feeling in your abdomen.   No driving for 24 hours (because of the anesthesia (medicine) used during the test).   You may shower.   Do not sign any important legal documents or operate any machinery for 24 hours (because of the anesthesia used during the test).  NUTRITION  Drink plenty of fluids.   You may resume your normal diet.   Begin with a light meal and progress to your normal diet.   Avoid alcoholic beverages for 24 hours or as instructed by your caregiver.  MEDICATIONS  You may resume your normal medications unless your caregiver tells you otherwise.  WHAT YOU CAN EXPECT TODAY  You may experience abdominal discomfort such as a feeling of fullness or gas pains.  FOLLOW-UP  Your doctor will discuss the results of your test with you.  SEEK IMMEDIATE MEDICAL ATTENTION IF ANY OF THE FOLLOWING OCCUR:  Excessive nausea (feeling sick to your stomach) and/or vomiting.   Severe abdominal pain and distention (swelling).   Trouble swallowing.   Temperature over 101 F (37.8 C).   Rectal bleeding or vomiting of blood.     Further recommendations to follow pending review of pathology report

## 2016-09-01 NOTE — Op Note (Signed)
[facility] Patient Name: Monica [name] Procedure Date: 09/01/2016 8:27 AM MRN: [id number] Date of Birth: 01/19/1941 Attending MD: Norvel Richards , MD CSN: [id number] Age: 75 Admit Type: Outpatient Procedure:                Upper GI endoscopy Indications:              Surveillance procedure Providers:                Norvel Richards, MD, Jeanann Lewandowsky. Sharon Seller, RN,                            Purcell Nails. [place], Merchant navy officer Referring MD:              Medicines:                Midazolam 3 mg IV, Meperidine 50 mg IV, Ondansetron                            4 mg IV Complications:            No immediate complications. Estimated Blood Loss:     Estimated blood loss was minimal. Procedure:                Pre-Anesthesia Assessment:                           - Prior to the procedure, a History and Physical                            was performed, and patient medications and                            allergies were reviewed. The patient's tolerance of                            previous anesthesia was also reviewed. The risks                            and benefits of the procedure and the sedation                            options and risks were discussed with the patient.                            All questions were answered, and informed consent                            was obtained. Prior Anticoagulants: The patient has                            taken no previous anticoagulant or antiplatelet                            agents. ASA Grade Assessment: II - A patient with  mild systemic disease. After reviewing the risks                            and benefits, the patient was deemed in                            satisfactory condition to undergo the procedure.                           After obtaining informed consent, the endoscope was                            passed under direct vision. Throughout the                            procedure, the  patient's blood pressure, pulse, and                            oxygen saturations were monitored continuously. The                            [id number] ([id number]) was introduced through the                            mouth, and advanced to the second part of duodenum.                            The patient tolerated the procedure well. Scope In: 8:34:11 AM Scope Out: 8:41:54 AM Total Procedure Duration: 0 hours 7 minutes 43 seconds  Findings:      A mild Schatzki ring (acquired) was found at the gastroesophageal       junction. lumen remain Widely patent. stomach empty.      A large hiatal hernia was present. localized area of friability and some       scar distal body/proximal greater curvature - much improved over that       seen previously. Patent pylorus.      The duodenal bulb and second portion of the duodenum were normal.       Biopsies were taken with a cold forceps for histology. Estimated blood       loss was minimal. Impression:               - Mild Schatzki ring. Marked improvement in                            previously noted neoplastic process. Residual                            changes consistent with treatment. Appears to have                            had a nice response to therapy. Status post biopsy.                           - Large hiatal hernia.                           -  Normal duodenal bulb and second portion of the                            duodenum. Moderate Sedation:      Moderate (conscious) sedation was administered by the endoscopy nurse       and supervised by the endoscopist. The following parameters were       monitored: oxygen saturation, heart rate, blood pressure, and response       to care. Total physician intraservice time was 12 minutes. Recommendation:           - Patient has a contact number available for                            emergencies. The signs and symptoms of potential                            delayed complications were  discussed with the                            patient. Return to normal activities tomorrow.                            Written discharge instructions were provided to the                            patient.                           - Resume previous diet.                           - Continue present medications.                           - No repeat upper endoscopy.                           - Return to GI office PRN. Procedure Code(s):        --- Professional ---                           [id number], Esophagogastroduodenoscopy, flexible,                            transoral; with biopsy, single or multiple                           99152, Moderate sedation services provided by the                            same physician or other qualified health care                            professional performing the diagnostic or  therapeutic service that the sedation supports,                            requiring the presence of an independent trained                            observer to assist in the monitoring of the                            patient's level of consciousness and physiological                            status; initial 15 minutes of intraservice time,                            patient age 61 years or older Diagnosis Code(s):        --- Professional ---                           K22.2, Esophageal obstruction                           K44.9, Diaphragmatic hernia without obstruction or                            gangrene CPT copyright 2016 American Medical Association. All rights reserved. The codes documented in this report are preliminary and upon coder review may  be revised to meet current compliance requirements. Cristopher Estimable. Roseland Braun, MD Norvel Richards, MD 09/01/2016 8:58:01 AM This report has been signed electronically. Number of Addenda: 0

## 2016-09-02 ENCOUNTER — Ambulatory Visit (HOSPITAL_COMMUNITY)
Admission: RE | Admit: 2016-09-02 | Discharge: 2016-09-02 | Disposition: A | Discharge status: Home / Self Care | Payer: Medicare Other | Source: Ambulatory Visit | Attending: Oncology | Admitting: Oncology

## 2016-09-02 ENCOUNTER — Other Ambulatory Visit (HOSPITAL_COMMUNITY): Payer: Self-pay | Admitting: Oncology

## 2016-09-02 ENCOUNTER — Encounter (HOSPITAL_COMMUNITY): Payer: Self-pay

## 2016-09-02 ENCOUNTER — Encounter (HOSPITAL_COMMUNITY): Payer: Medicare Other | Attending: Hematology & Oncology

## 2016-09-02 DIAGNOSIS — E119 Type 2 diabetes mellitus without complications: Secondary | ICD-10-CM | POA: Diagnosis not present

## 2016-09-02 DIAGNOSIS — Z882 Allergy status to sulfonamides status: Secondary | ICD-10-CM | POA: Diagnosis not present

## 2016-09-02 DIAGNOSIS — Z800 Family history of malignant neoplasm of digestive organs: Secondary | ICD-10-CM | POA: Diagnosis not present

## 2016-09-02 DIAGNOSIS — J439 Emphysema, unspecified: Secondary | ICD-10-CM | POA: Insufficient documentation

## 2016-09-02 DIAGNOSIS — F329 Major depressive disorder, single episode, unspecified: Secondary | ICD-10-CM | POA: Insufficient documentation

## 2016-09-02 DIAGNOSIS — R911 Solitary pulmonary nodule: Secondary | ICD-10-CM

## 2016-09-02 DIAGNOSIS — I700 Atherosclerosis of aorta: Secondary | ICD-10-CM | POA: Diagnosis not present

## 2016-09-02 DIAGNOSIS — Z9889 Other specified postprocedural states: Secondary | ICD-10-CM | POA: Diagnosis not present

## 2016-09-02 DIAGNOSIS — Z79899 Other long term (current) drug therapy: Secondary | ICD-10-CM | POA: Insufficient documentation

## 2016-09-02 DIAGNOSIS — E785 Hyperlipidemia, unspecified: Secondary | ICD-10-CM | POA: Insufficient documentation

## 2016-09-02 DIAGNOSIS — K449 Diaphragmatic hernia without obstruction or gangrene: Secondary | ICD-10-CM | POA: Diagnosis not present

## 2016-09-02 DIAGNOSIS — Z833 Family history of diabetes mellitus: Secondary | ICD-10-CM | POA: Diagnosis not present

## 2016-09-02 DIAGNOSIS — R1310 Dysphagia, unspecified: Secondary | ICD-10-CM | POA: Insufficient documentation

## 2016-09-02 DIAGNOSIS — C884 Extranodal marginal zone b-cell lymphoma of mucosa-associated lymphoid tissue (malt-lymphoma) not having achieved remission: Secondary | ICD-10-CM

## 2016-09-02 DIAGNOSIS — Z7982 Long term (current) use of aspirin: Secondary | ICD-10-CM | POA: Insufficient documentation

## 2016-09-02 DIAGNOSIS — I2510 Atherosclerotic heart disease of native coronary artery without angina pectoris: Secondary | ICD-10-CM | POA: Diagnosis not present

## 2016-09-02 DIAGNOSIS — Z923 Personal history of irradiation: Secondary | ICD-10-CM | POA: Diagnosis not present

## 2016-09-02 DIAGNOSIS — Z8249 Family history of ischemic heart disease and other diseases of the circulatory system: Secondary | ICD-10-CM | POA: Insufficient documentation

## 2016-09-02 DIAGNOSIS — Z7984 Long term (current) use of oral hypoglycemic drugs: Secondary | ICD-10-CM | POA: Insufficient documentation

## 2016-09-02 DIAGNOSIS — F17210 Nicotine dependence, cigarettes, uncomplicated: Secondary | ICD-10-CM | POA: Insufficient documentation

## 2016-09-02 LAB — CBC WITH DIFFERENTIAL/PLATELET
BASOS ABS: 0 10*3/uL (ref 0.0–0.1)
BASOS PCT: 1 %
EOS ABS: 0.1 10*3/uL (ref 0.0–0.7)
EOS PCT: 2 %
HCT: 41.7 % (ref 36.0–46.0)
Hemoglobin: 13.4 g/dL (ref 12.0–15.0)
Lymphocytes Relative: 24 %
Lymphs Abs: 1.3 10*3/uL (ref 0.7–4.0)
MCH: 27.9 pg (ref 26.0–34.0)
MCHC: 32.1 g/dL (ref 30.0–36.0)
MCV: 86.7 fL (ref 78.0–100.0)
MONO ABS: 0.4 10*3/uL (ref 0.1–1.0)
Monocytes Relative: 8 %
Neutro Abs: 3.4 10*3/uL (ref 1.7–7.7)
Neutrophils Relative %: 65 %
PLATELETS: 225 10*3/uL (ref 150–400)
RBC: 4.81 MIL/uL (ref 3.87–5.11)
RDW: 15.4 % (ref 11.5–15.5)
WBC: 5.2 10*3/uL (ref 4.0–10.5)

## 2016-09-02 LAB — COMPREHENSIVE METABOLIC PANEL
ALT: 18 U/L (ref 14–54)
AST: 25 U/L (ref 15–41)
Albumin: 4.1 g/dL (ref 3.5–5.0)
Alkaline Phosphatase: 70 U/L (ref 38–126)
Anion gap: 8 (ref 5–15)
BUN: 13 mg/dL (ref 6–20)
CHLORIDE: 101 mmol/L (ref 101–111)
CO2: 24 mmol/L (ref 22–32)
Calcium: 9.1 mg/dL (ref 8.9–10.3)
Creatinine, Ser: 0.75 mg/dL (ref 0.44–1.00)
GFR calc Af Amer: >60 mL/min (ref >60)
Glucose, Bld: 199 mg/dL — ABNORMAL HIGH (ref 65–99)
POTASSIUM: 4 mmol/L (ref 3.5–5.1)
SODIUM: 133 mmol/L — AB (ref 135–145)
Total Bilirubin: 0.4 mg/dL (ref 0.3–1.2)
Total Protein: 7.8 g/dL (ref 6.5–8.1)

## 2016-09-02 LAB — LACTATE DEHYDROGENASE: LDH: 110 U/L (ref 98–192)

## 2016-09-03 ENCOUNTER — Encounter (HOSPITAL_COMMUNITY): Payer: Self-pay | Admitting: Hematology & Oncology

## 2016-09-03 ENCOUNTER — Encounter (HOSPITAL_BASED_OUTPATIENT_CLINIC_OR_DEPARTMENT_OTHER): Payer: Medicare Other | Admitting: Hematology & Oncology

## 2016-09-03 VITALS — BP 143/56 | HR 84 | Temp 98.0°F | Resp 16 | Wt 165.4 lb

## 2016-09-03 DIAGNOSIS — Z720 Tobacco use: Secondary | ICD-10-CM

## 2016-09-03 DIAGNOSIS — R911 Solitary pulmonary nodule: Secondary | ICD-10-CM

## 2016-09-03 DIAGNOSIS — C884 Extranodal marginal zone B-cell lymphoma of mucosa-associated lymphoid tissue [MALT-lymphoma]: Secondary | ICD-10-CM | POA: Diagnosis not present

## 2016-09-03 NOTE — Patient Instructions (Signed)
[place] at [facility] Discharge Instructions  RECOMMENDATIONS MADE BY THE CONSULTANT AND ANY TEST RESULTS WILL BE SENT TO YOUR REFERRING PHYSICIAN.  You saw Dr.Penland today. Follow up in 4 months with labs. See Amy at checkout for appointments.  Thank you for choosing [place] at [facility] to provide your oncology and hematology care.  To afford each patient quality time with our provider, please arrive at least 15 minutes before your scheduled appointment time.   Beginning January 23rd 2017 lab work for the [facility] will be done in the  Main lab at [facility] on 1st floor. If you have a lab appointment with the [facility] please come in thru the  Main Entrance and check in at the main information desk  You need to re-schedule your appointment should you arrive 10 or more minutes late.  We strive to give you quality time with our providers, and arriving late affects you and other patients whose appointments are after yours.  Also, if you no show three or more times for appointments you may be dismissed from the clinic at the providers discretion.     Again, thank you for choosing [facility].  Our hope is that these requests will decrease the amount of time that you wait before being seen by our physicians.       _____________________________________________________________  Should you have questions after your visit to [facility], please contact our office at [phone] between the hours of 8:30 a.m. and 4:30 p.m.  Voicemails left after 4:30 p.m. will not be returned until the following business day.  For prescription refill requests, have your pharmacy contact our office.         Resources For Cancer Patients and their Caregivers ? American Cancer Society: Can assist with transportation, wigs, general needs, runs Look Good Feel Better.        432-428-7211 ? Cancer Care: Provides financial  assistance, online support groups, medication/co-pay assistance.  1-800-813-HOPE (305)297-9847) ? [place] Assists [place] cancer patients and their families through emotional , educational and financial support.  450-234-7874 ? [place] DSS Where to apply for food stamps, Medicaid and utility assistance. 860-495-9818 ? [facility]: Transportation to medical appointments. (705)374-8085 ? Social Security Administration: May apply for disability if have a Stage IV cancer. 440-042-2175 843-070-8475 ? [employer], Disability and Transit Services: Assists with nutrition, care and transit needs. [place] Support Programs: '@10RELATIVEDAYS'$ @ > Cancer Support Group  2nd Tuesday of the month 1pm-2pm, Journey Room  > Creative Journey  3rd Tuesday of the month 1130am-1pm, Journey Room  > Look Good Feel Better  1st Wednesday of the month 10am-12 noon, Journey Room (Call [place] to register [phone])

## 2016-09-03 NOTE — Progress Notes (Signed)
[place]  PROGRESS NOTE  Patient Care Team: Zella Richer. Scotty Court, MD as PCP - General (Family Medicine) Daneil Dolin, MD as Consulting Physician (Gastroenterology)  CHIEF COMPLAINTS/PURPOSE OF CONSULTATION:  NHL, Malt Lymphoma H. 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 Gala Romney, abnormal gastric mucosa. Pathology with EXTRANODAL Marginal zone lymphoma. NEGATIVE for H. Pylori      03/03/2016 Miscellaneous    H pylori stool antigen NEGATIVE      03/03/2016 PET scan    Focal area of hypermetabolism and wall thickening involving the body antral junction region of the stomach c/w history of lymphoma. 5.5 mm LLL pulm nodule not hypermetabolic. Non contrast chest CT in 6 month      03/18/2016 - 04/08/2016 Radiation Therapy    The gastric tumor received 30 Gy in 15 fractions of 2 Gy        HISTORY OF PRESENTING ILLNESS:  Monica [name] 75 y.o. female is here for follow-up of Gastric MALT lymphoma  [name] notes a history of dysphagia with an endoscopy in May 2016, pathology showed a lymphoid population of stomach with slight atypia.  She presented to GI in March for ongoing follow-up. H. Pylori serology was negative. She notes she is aware of her diagnosis.   She underwent repeat EGD on 02/03/2016 with Dr. Gala Romney, abnormal area of gastric mucosa was biopsied. 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 - BACKGROUND CHRONIC ACTIVE GASTRITIS. - NEGATIVE FOR HELICOBACTER PYLORI  [name] returns to the [place] accompanied by her husband. She is feeling good today.  She states that she has a good appetite. She notices a big difference between now and the summer. She has much more energy.  She's had her flu shot. She is up to date on her mammogram.   She asked if someone will continue to check up on her and make sure this  doesn't come back.   She states that her bowels are okay. She denies nausea, vomiting, abdominal pain, and diarrhea.   She is trying to quit smoking, but continues to smoke when she's stressed or nervous.   I have reviewed labs and imaging with the patient.    MEDICAL HISTORY:  Past Medical History:  Diagnosis Date  . Cancer (HCC)    NHL, Malt Lymphoma  . Depression   . DM (diabetes mellitus) ([place])   . Dysphagia   . Dysrhythmia    History of palpatations  . Heart palpitations   . Hiatal hernia   . Hyperlipidemia   . Interstitial cystitis   . Sinusitis     SURGICAL HISTORY: Past Surgical History:  Procedure Laterality Date  . COLONOSCOPY     Dr. Lindalou Hose 2009: Normal per PCP notes  . ESOPHAGOGASTRODUODENOSCOPY     RMR: Prominant Schzgzki ring/component of peptic stricture status post dilation and disruption as described above, otherwise norma esophagus, moderate-sized hiatal hernia, antal pyloric channel, and posterier bulbar erosions, otherwise unremarkable stomach, D1 and D2 . Inflammatory findings on the stomach and duodenum will likely be related to aspirin effect. We need to rule out Helicobacter pylor  . ESOPHAGOGASTRODUODENOSCOPY N/A 03/10/2015   Dr. 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 Pathology with lymphoid population of stomach, slight atypia  . ESOPHAGOGASTRODUODENOSCOPY N/A 02/03/2016   Dr.  Rourk: Schatzki ring noted at [employer], dilated with 56 and then 58 [place] Maloney dilator. Large hiatal hernia. 6 x 7 cm nodular geographically ulcerated mucosa, biopsy c/w MALToma  . ESOPHAGOGASTRODUODENOSCOPY N/A 04/08/2016   Dr. Gala Romney: moderate Schatzki's ring/web s/p dilation, large hiatal hernia, localized area of gastric lymphoma visualized and appeared to be much improved. normal second portion of the duodenum. No specimens collected. Esophageal lumen notably tighted up significantly since her dilation  in April of this year.   . ESOPHAGOGASTRODUODENOSCOPY N/A 08/04/2016   Procedure: ESOPHAGOGASTRODUODENOSCOPY (EGD);  Surgeon: Daneil Dolin, MD;  Location: [facility];  Service: Endoscopy;  Laterality: N/A;  7:30 am  . MALONEY DILATION N/A 03/10/2015   Procedure: Venia Minks DILATION;  Surgeon: Daneil Dolin, MD;  Location: [facility] ENDO SUITE;  Service: Endoscopy;  Laterality: N/A;  . Venia Minks DILATION N/A 02/03/2016   Procedure: Venia Minks DILATION;  Surgeon: Daneil Dolin, MD;  Location: [facility];  Service: Endoscopy;  Laterality: N/A;  . Venia Minks DILATION N/A 04/08/2016   Procedure: Venia Minks DILATION;  Surgeon: Daneil Dolin, MD;  Location: [facility];  Service: Endoscopy;  Laterality: N/A;    SOCIAL HISTORY: Social History   Social History  . Marital status: Married    Spouse name: N/A  . Number of children: N/A  . Years of education: N/A   Occupational History  . Not on file.   Social History Main Topics  . Smoking status: Current Every Day Smoker    Packs/day: 0.75    Years: 57.00    Types: Cigarettes  . Smokeless tobacco: Never Used  . Alcohol use No  . Drug use: No  . Sexual activity: Not on file   Other Topics Concern  . Not on file   Social History Narrative  . No narrative on file  Married; 72 years 4 children 5 grandsons; 1 great grandchild Smokes currently; Almost a pack a day; started at 75 years old. 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 Heart attack Father   . Dementia Father   . Diabetes Sister   . Diabetes Brother   . Diabetes Sister   . Colon cancer Neg Hx   Mother-is living at 64 years old Father-died of heart attack; early 35's 1 brother 3 sisters 1 sister bad type 1 diabetic and died 109 years ago 1 son died 8 years ago of cancer; had a tumor removed from his spine 1 son committed suicide in the 57's  ALLERGIES:  is allergic to  sulfonamide derivatives and sulfur.  MEDICATIONS:  Current Outpatient Prescriptions  Medication Sig Dispense Refill  . aspirin 81 MG tablet Take 81 mg by mouth daily.    . Calcium-Magnesium-Vitamin D (CALCIUM 500 PO) Take 500 mg by mouth daily.    . cephALEXin (KEFLEX) 250 MG capsule Take 250 mg by mouth at bedtime.    . citalopram (CELEXA) 20 MG tablet Take 20 mg by mouth daily.     Marland Kitchen dexlansoprazole (DEXILANT) 60 MG capsule Take 1 capsule (60 mg total) by mouth daily. 90 capsule 3  . ESTRACE VAGINAL 0.1 MG/GM vaginal cream Place 1 Applicatorful vaginally every Monday, Wednesday, and Friday.     Marland Kitchen HYDROcodone-homatropine (HYCODAN) 5-1.5 MG/5ML syrup TAKE 5 MLS BY MOUTH AT BEDTIME AS NEEDED FOR SEVERE COUGH  0  . metFORMIN (GLUCOPHAGE-XR) 500 MG 24 hr tablet Take 500 mg by mouth daily with breakfast.     .  metoprolol tartrate (LOPRESSOR) 25 MG tablet Take 25 mg by mouth daily.     . pravastatin (PRAVACHOL) 40 MG tablet Take 40 mg by mouth at bedtime.     . Probiotic Product (PROBIOTIC PO) Take 1 capsule by mouth daily.     No current facility-administered medications for this visit.     Review of Systems  Constitutional: Negative.   HENT: Negative.   Eyes: Negative.   Respiratory: Negative.   Cardiovascular: Negative.   Gastrointestinal: Negative.  Negative for abdominal pain, diarrhea, nausea and vomiting.  Genitourinary: Negative.   Musculoskeletal: Negative.   Skin: Negative.   Neurological: Negative.   Endo/Heme/Allergies: Negative.   Psychiatric/Behavioral: Negative.   All other systems reviewed and are negative. 14 point review of systems was performed and is negative except as detailed under history of present illness and above    PHYSICAL EXAMINATION: ECOG PERFORMANCE STATUS: 0 - Asymptomatic  Vitals:   09/03/16 1111  BP: (!) 143/56  Pulse: 84  Resp: 16  Temp: 98 F (36.7 C)   Filed Weights   09/03/16 1111  Weight: 165 lb 6.4 oz (75 kg)      Physical  Exam  Constitutional: She is oriented to person, place, and time and well-developed, well-nourished, and in no distress.  HENT:  Head: Normocephalic and atraumatic.  Nose: Nose normal.  Mouth/Throat: Oropharynx is clear and moist. No oropharyngeal exudate.  Eyes: Conjunctivae and EOM are normal. Pupils are equal, round, and reactive to light. Right eye exhibits no discharge. Left eye exhibits no discharge. No scleral icterus.  Neck: Normal range of motion. Neck supple. No tracheal deviation present. No thyromegaly present.  Cardiovascular: Normal rate, regular rhythm and normal heart sounds.  Exam reveals no gallop and no friction rub.   No murmur heard. Pulmonary/Chest: Effort normal and breath sounds normal. She has no wheezes. She has no rales.  Abdominal: Soft. Bowel sounds are normal. She exhibits no distension and no mass. There is no tenderness. There is no rebound and no guarding.  Musculoskeletal: Normal range of motion. She exhibits no edema.  Lymphadenopathy:    She has no cervical adenopathy.  Neurological: She is alert and oriented to person, place, and time. She has normal reflexes. No cranial nerve deficit. Gait normal. Coordination normal.  Skin: Skin is warm and dry. No rash noted.  Psychiatric: Mood, memory, affect and judgment normal.  Nursing note and vitals reviewed.    LABORATORY DATA:  I have reviewed the data as listed   Results for LAMEKA, DISLA (MRN [id number]) as of 09/03/2016 08:06  Ref. Range 09/02/2016 10:19  Sodium Latest Ref Range: 135 - 145 mmol/L 133 (L)  Potassium Latest Ref Range: 3.5 - 5.1 mmol/L 4.0  Chloride Latest Ref Range: 101 - 111 mmol/L 101  CO2 Latest Ref Range: 22 - 32 mmol/L 24  BUN Latest Ref Range: 6 - 20 mg/dL 13  Creatinine Latest Ref Range: 0.44 - 1.00 mg/dL 0.75  Calcium Latest Ref Range: 8.9 - 10.3 mg/dL 9.1  EGFR (Non-African Amer.) Latest Ref Range: >60 mL/min >60  EGFR (African American) Latest Ref Range: >60 mL/min >60    Glucose Latest Ref Range: 65 - 99 mg/dL 199 (H)  Anion gap Latest Ref Range: 5 - 15  8  Alkaline Phosphatase Latest Ref Range: 38 - 126 U/L 70  Albumin Latest Ref Range: 3.5 - 5.0 g/dL 4.1  AST Latest Ref Range: 15 - 41 U/L 25  ALT Latest Ref Range: 14 -  54 U/L 18  Total Protein Latest Ref Range: 6.5 - 8.1 g/dL 7.8  Total Bilirubin Latest Ref Range: 0.3 - 1.2 mg/dL 0.4  LDH Latest Ref Range: 98 - 192 U/L 110  WBC Latest Ref Range: 4.0 - 10.5 K/uL 5.2  RBC Latest Ref Range: 3.87 - 5.11 MIL/uL 4.81  Hemoglobin Latest Ref Range: 12.0 - 15.0 g/dL 13.4  HCT Latest Ref Range: 36.0 - 46.0 % 41.7  MCV Latest Ref Range: 78.0 - 100.0 fL 86.7  MCH Latest Ref Range: 26.0 - 34.0 pg 27.9  MCHC Latest Ref Range: 30.0 - 36.0 g/dL 32.1  RDW Latest Ref Range: 11.5 - 15.5 % 15.4  Platelets Latest Ref Range: 150 - 400 K/uL 225  Neutrophils Latest Units: % 65  Lymphocytes Latest Units: % 24  Monocytes Relative Latest Units: % 8  Eosinophil Latest Units: % 2  Basophil Latest Units: % 1  NEUT# Latest Ref Range: 1.7 - 7.7 K/uL 3.4  Lymphocyte # Latest Ref Range: 0.7 - 4.0 K/uL 1.3  Monocyte # Latest Ref Range: 0.1 - 1.0 K/uL 0.4  Eosinophils Absolute Latest Ref Range: 0.0 - 0.7 K/uL 0.1  Basophils Absolute Latest Ref Range: 0.0 - 0.1 K/uL 0.0    RADIOGRAPHIC STUDIES: I have reviewed the imaging below.  Study Result    CLINICAL DATA:  Gastric malt lymphoma diagnosed 5/17. Radiation therapy. Pulmonary nodule.  EXAM: CT CHEST WITHOUT CONTRAST  TECHNIQUE: Multidetector CT imaging of the chest was performed following the standard protocol without IV contrast.  COMPARISON:  PET of 03/03/2016.  FINDINGS: Cardiovascular: Aortic and branch vessel atherosclerosis. Mild cardiomegaly. Multivessel coronary artery atherosclerosis.  Mediastinum/Nodes: Upper normal 8 mm AP window node on image 53/series 2 is unchanged was not hypermetabolic on prior PET. Hilar regions poorly evaluated without  intravenous contrast. Moderate to large hiatal hernia. Fluid level in the esophagus including on image 71/series 2.  Lungs/Pleura: No pleural fluid. Moderate centrilobular emphysema. Lower lobe predominant bronchial wall thickening. Left base scarring or subsegmental atelectasis with volume loss. Left major fissure thickening is similar, including on image 72/ series 3.  A subpleural left lower lobe pulmonary nodule measures 6 mm on image 83/series 3 and is unchanged (when remeasured).  Upper Abdomen: Normal imaged portions of the liver, spleen, adrenal glands, kidneys. Abdominal aortic atherosclerosis. No well-defined gastric mass. No upper abdominal adenopathy.  Musculoskeletal: No acute osseous abnormality. Convex right thoracic spine curvature.  IMPRESSION: 1. The left lower lobe pulmonary nodule is similar, 6 mm. Favored to represent a subpleural lymph node. Given the extent of emphysema, consider follow-up in May of 2018, to confirm 1 year stability. This recommendation follows the consensus statement: Guidelines for Management of Small Pulmonary Nodules Detected on CT Images: From the Fleischner Society 2017; Radiology 2017; 284:228-243. 2. No thoracic adenopathy. 3.  Coronary artery atherosclerosis. Aortic atherosclerosis. 4. Moderate to large hiatal hernia. Esophageal air fluid level suggests dysmotility or gastroesophageal reflux.   Electronically Signed   By: Abigail Miyamoto M.D.   On: 09/02/2016 12:05     PATHOLOGY:     ASSESSMENT & PLAN:  NHL, Malt Lymphoma H. Pylori Negative Tobacco Use LLL pulmonary nodule  She is doing well.  She will continue follow-up with Dr. Gala Romney, I advised her that EGD in the future is necessary.  CT was reviewed with the patient, results are noted above. Pulmonary nodule is stable. Repeat CT imaging in one year is recommended.   I addressed the importance of smoking cessation with the  patient in detail.  We discussed the  health benefits of cessation.  We discussed the health detriments of ongoing tobacco use including but not limited to COPD, heart disease and malignancy. We reviewed the multiple options for cessation and I offered to refer her to smoking cessation classes. We discussed other alternatives to quit such as chantix, wellbutrin. We will continue to address this moving forward..   She will return for a follow up in 4 months.   Orders Placed This Encounter  Procedures  . CBC with Differential    Standing Status:   Future    Standing Expiration Date:   09/03/2017  . Comprehensive metabolic panel    Standing Status:   Future    Standing Expiration Date:   09/03/2017  . Lactate dehydrogenase    Standing Status:   Future    Standing Expiration Date:   09/03/2017    All questions were answered. The patient knows to call the clinic with any problems, questions or concerns.  This document serves as a record of services personally performed by Ancil Linsey, MD. It was created on her behalf by Martinique Casey, a trained medical scribe. The creation of this record is based on the scribe's personal observations and the provider's statements to them. This document has been checked and approved by the attending provider.  I have reviewed the above documentation for accuracy and completeness, and I agree with the above.  This note was electronically signed.    Molli Hazard, MD  09/03/2016 12:20 PM

## 2016-09-05 ENCOUNTER — Encounter: Payer: Self-pay | Admitting: Internal Medicine

## 2016-09-08 ENCOUNTER — Encounter (HOSPITAL_COMMUNITY): Payer: Self-pay | Admitting: Internal Medicine

## 2016-09-14 DIAGNOSIS — F17200 Nicotine dependence, unspecified, uncomplicated: Secondary | ICD-10-CM | POA: Diagnosis not present

## 2016-09-14 DIAGNOSIS — J410 Simple chronic bronchitis: Secondary | ICD-10-CM | POA: Diagnosis not present

## 2016-09-14 DIAGNOSIS — E119 Type 2 diabetes mellitus without complications: Secondary | ICD-10-CM | POA: Diagnosis not present

## 2016-09-27 ENCOUNTER — Encounter (HOSPITAL_COMMUNITY): Payer: Self-pay | Admitting: Hematology & Oncology

## 2016-12-27 DIAGNOSIS — N3020 Other chronic cystitis without hematuria: Secondary | ICD-10-CM | POA: Diagnosis not present

## 2016-12-27 DIAGNOSIS — N952 Postmenopausal atrophic vaginitis: Secondary | ICD-10-CM | POA: Diagnosis not present

## 2017-01-06 ENCOUNTER — Other Ambulatory Visit (HOSPITAL_COMMUNITY): Payer: Self-pay | Admitting: *Deleted

## 2017-01-06 DIAGNOSIS — C884 Extranodal marginal zone B-cell lymphoma of mucosa-associated lymphoid tissue [MALT-lymphoma]: Secondary | ICD-10-CM

## 2017-01-07 ENCOUNTER — Encounter (HOSPITAL_COMMUNITY): Payer: Medicare Other | Attending: Oncology

## 2017-01-07 ENCOUNTER — Encounter (HOSPITAL_COMMUNITY): Payer: Self-pay

## 2017-01-07 ENCOUNTER — Encounter (HOSPITAL_COMMUNITY): Payer: Medicare Other | Attending: Oncology | Admitting: Oncology

## 2017-01-07 VITALS — BP 131/64 | HR 70 | Temp 98.3°F | Resp 18 | Wt 170.6 lb

## 2017-01-07 DIAGNOSIS — C884 Extranodal marginal zone B-cell lymphoma of mucosa-associated lymphoid tissue [MALT-lymphoma]: Secondary | ICD-10-CM | POA: Insufficient documentation

## 2017-01-07 DIAGNOSIS — R6884 Jaw pain: Secondary | ICD-10-CM

## 2017-01-07 DIAGNOSIS — Z720 Tobacco use: Secondary | ICD-10-CM | POA: Diagnosis not present

## 2017-01-07 DIAGNOSIS — R911 Solitary pulmonary nodule: Secondary | ICD-10-CM | POA: Diagnosis not present

## 2017-01-07 DIAGNOSIS — M542 Cervicalgia: Secondary | ICD-10-CM

## 2017-01-07 LAB — CBC WITH DIFFERENTIAL/PLATELET
BASOS PCT: 0 %
Basophils Absolute: 0 10*3/uL (ref 0.0–0.1)
Eosinophils Absolute: 0.1 10*3/uL (ref 0.0–0.7)
Eosinophils Relative: 1 %
HEMATOCRIT: 39 % (ref 36.0–46.0)
Hemoglobin: 12.6 g/dL (ref 12.0–15.0)
LYMPHS PCT: 26 %
Lymphs Abs: 1.7 10*3/uL (ref 0.7–4.0)
MCH: 28.4 pg (ref 26.0–34.0)
MCHC: 32.3 g/dL (ref 30.0–36.0)
MCV: 87.8 fL (ref 78.0–100.0)
MONO ABS: 0.7 10*3/uL (ref 0.1–1.0)
MONOS PCT: 10 %
NEUTROS ABS: 4.3 10*3/uL (ref 1.7–7.7)
Neutrophils Relative %: 63 %
Platelets: 213 10*3/uL (ref 150–400)
RBC: 4.44 MIL/uL (ref 3.87–5.11)
RDW: 15.6 % — ABNORMAL HIGH (ref 11.5–15.5)
WBC: 6.8 10*3/uL (ref 4.0–10.5)

## 2017-01-07 LAB — COMPREHENSIVE METABOLIC PANEL
ALT: 17 U/L (ref 14–54)
ANION GAP: 6 (ref 5–15)
AST: 23 U/L (ref 15–41)
Albumin: 3.9 g/dL (ref 3.5–5.0)
Alkaline Phosphatase: 69 U/L (ref 38–126)
BILIRUBIN TOTAL: 0.3 mg/dL (ref 0.3–1.2)
BUN: 12 mg/dL (ref 6–20)
CO2: 27 mmol/L (ref 22–32)
Calcium: 9.2 mg/dL (ref 8.9–10.3)
Chloride: 101 mmol/L (ref 101–111)
Creatinine, Ser: 0.82 mg/dL (ref 0.44–1.00)
GFR calc Af Amer: >60 mL/min (ref >60)
Glucose, Bld: 195 mg/dL — ABNORMAL HIGH (ref 65–99)
POTASSIUM: 4.1 mmol/L (ref 3.5–5.1)
Sodium: 134 mmol/L — ABNORMAL LOW (ref 135–145)
TOTAL PROTEIN: 7.3 g/dL (ref 6.5–8.1)

## 2017-01-07 LAB — LACTATE DEHYDROGENASE: LDH: 119 U/L (ref 98–192)

## 2017-01-07 NOTE — Patient Instructions (Signed)
[place] at [facility] Discharge Instructions  RECOMMENDATIONS MADE BY THE CONSULTANT AND ANY TEST RESULTS WILL BE SENT TO YOUR REFERRING PHYSICIAN.  You were seen today by Dr. Twana First Follow up in 63month See Amy up front for appointments   Thank you for choosing [facility] and [facility].  To afford each patient quality time with our provider, please arrive at least 15 minutes before your scheduled appointment time.    If you have a lab appointment with the [facility] come in thru the  Main Entrance and check in at the main information desk  You need to re-schedule your appointment should you arrive 10 or more minutes late.  We strive to give you quality time with our providers, and arriving late affects you and other patients whose appointments are after yours.  Also, if you no show three or more times for appointments you may be dismissed from the clinic at the providers discretion.     Again, [facility] you for choosing [facility]  Our hope is that these requests will decrease the amount of time that you wait before being seen by our physicians.       _____________________________________________________________  Should you have questions after your [facility] to [facility] please contact our office at [phone] between the hours of 8:30 a.m. and 4:30 p.m.  Voicemails left after 4:30 p.m. will not be returned until the following business day.  For prescription refill requests, have your pharmacy contact our office.       Resources For Cancer Patients and their Caregivers ? American Cancer Society: Can assist with transportation, wigs, general needs, runs Look Good Feel Better.        12038882369? Cancer Care: Provides financial assistance, online support groups, medication/co-pay assistance.  1-800-813-HOPE (956-234-2853 ? [facility] [place] [place] cancer patients and their families through emotional , educational and financial support.  3(229) 349-2139? [place] DSS Where to apply for food stamps, Medicaid and utility assistance. 3(929) 069-8861? [facility]: Transportation to medical appointments. 3(478)333-4073? Social Security Administration: May apply for disability if have a Stage IV cancer. 3(985) 823-72001306-688-7450? [employer]: Assists with nutrition, care and transit needs. [facility] Programs: '@10RELATIVEDAYS'$ @ > Cancer Support Group  2nd Tuesday of the month 1pm-2pm, Journey Room  > Creative Journey  3rd Tuesday of the month 1130am-1pm, Journey Room  > Look Good Feel Better  1st Wednesday of the month 10am-12 noon, Journey Room (Call [facility] register [phone]

## 2017-01-07 NOTE — Progress Notes (Signed)
[place]  PROGRESS NOTE  Patient Care Team: Zella Richer. 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 Gala Romney, abnormal gastric mucosa. Pathology with EXTRANODAL Marginal zone lymphoma. NEGATIVE for H. Pylori      03/03/2016 Miscellaneous    H pylori stool antigen NEGATIVE      03/03/2016 PET scan    Focal area of hypermetabolism and wall thickening involving the body antral junction region of the stomach c/w history of lymphoma. 5.5 mm LLL pulm nodule not hypermetabolic. Non contrast chest CT in 6 month      03/18/2016 - 04/08/2016 Radiation Therapy    The gastric tumor received 30 Gy in 15 fractions of 2 Gy        HISTORY OF PRESENTING ILLNESS:  Monica [name] 76 y.o. 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  She has been doing well. Her last endoscopy was late last year, but has not followed up with Dr. Gala Romney since. She has a CT scheduled in May. The right side of her neck and jaw has some pain. This has been going on for about 1.5 weeks. Denies weight loss, loss of appetite, chest pain, SOB, abdominal pain, or any other concerns.   MEDICAL HISTORY:  Past Medical History:  Diagnosis Date  . Cancer (HCC)    NHL, Malt Lymphoma  . Depression   . DM (diabetes mellitus) ([place])   . Dysphagia   . Dysrhythmia    History of palpatations  . Heart palpitations   . Hiatal hernia   . Hyperlipidemia   . Interstitial cystitis   . Sinusitis     SURGICAL HISTORY: Past Surgical History:  Procedure Laterality Date  . BIOPSY  09/01/2016   Procedure: BIOPSY;  Surgeon: Daneil Dolin, MD;  [facility];  Service: Endoscopy;;  gastric  . COLONOSCOPY     Dr. Lindalou Hose 2009: Normal per PCP notes  .  ESOPHAGOGASTRODUODENOSCOPY     RMR: Prominant Schzgzki ring/component of peptic stricture status post dilation and disruption as described above, otherwise norma esophagus, moderate-sized hiatal hernia, antal pyloric channel, and posterier bulbar erosions, otherwise unremarkable stomach, D1 and D2 . Inflammatory findings on the stomach and duodenum will likely be related to aspirin effect. We need to rule out Helicobacter pylor  . ESOPHAGOGASTRODUODENOSCOPY N/A 03/10/2015   Dr. 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 Pathology with lymphoid population of stomach, slight atypia  . ESOPHAGOGASTRODUODENOSCOPY N/A 02/03/2016   Dr. Gala Romney: Schatzki ring noted at GE junction, dilated with 22 and then 41 [place] dilator. Large hiatal hernia. 6 x 7 cm nodular geographically ulcerated mucosa, biopsy c/w MALToma  . ESOPHAGOGASTRODUODENOSCOPY N/A 04/08/2016   Dr. Gala Romney: moderate Schatzki's ring/web s/p dilation, large hiatal hernia, localized area of gastric lymphoma visualized and appeared to be much improved. normal second portion of the duodenum. No specimens collected. Esophageal lumen notably tighted up significantly since her dilation in April of this year.   . ESOPHAGOGASTRODUODENOSCOPY N/A 08/04/2016   Procedure: ESOPHAGOGASTRODUODENOSCOPY (EGD);  Surgeon: Daneil Dolin, MD;  [facility];  Service: Endoscopy;  Laterality: N/A;  7:30 am  . ESOPHAGOGASTRODUODENOSCOPY N/A 09/01/2016  Procedure: ESOPHAGOGASTRODUODENOSCOPY (EGD);  Surgeon: Daneil Dolin, MD;  [facility];  Service: Endoscopy;  Laterality: N/A;  830   . MALONEY DILATION N/A 03/10/2015   Procedure: Venia Minks DILATION;  Surgeon: Daneil Dolin, MD;  [facility];  Service: Endoscopy;  Laterality: N/A;  . Venia Minks DILATION N/A 02/03/2016   Procedure: Venia Minks DILATION;  Surgeon: Daneil Dolin, MD;  [facility];  Service: Endoscopy;   Laterality: N/A;  . Venia Minks DILATION N/A 04/08/2016   Procedure: Venia Minks DILATION;  Surgeon: Daneil Dolin, MD;  [facility];  Service: Endoscopy;  Laterality: N/A;    SOCIAL HISTORY: Social History   Social History  . Marital status: Married    Spouse name: N/A  . Number of children: N/A  . Years of education: N/A   Occupational History  . Not on file.   Social History Main Topics  . Smoking status: Current Every Day Smoker    Packs/day: 0.75    Years: 57.00    Types: Cigarettes  . Smokeless tobacco: Never Used  . Alcohol use No  . Drug use: No  . Sexual activity: Not on file   Other Topics Concern  . Not on file   Social History Narrative  . No narrative on file  Married; 59 years 4 children 5 grandsons; 1 great grandchild Smokes currently; Almost a pack a day; started at 76 years old. 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 Heart attack Father   . Dementia Father   . Diabetes Sister   . Diabetes Brother   . Diabetes Sister   . Colon cancer Neg Hx   Mother-is living at 65 years old Father-died of heart attack; early 90's 1 brother 3 sisters 1 sister bad type 1 diabetic and died 60 years ago 1 son died 8 years ago of cancer; had a tumor removed from his spine 1 son committed suicide in the 68's  ALLERGIES:  is allergic to sulfonamide derivatives and sulfur.  MEDICATIONS:  Current Outpatient Prescriptions  Medication Sig Dispense Refill  . aspirin 81 MG tablet Take 81 mg by mouth daily.    . Calcium-Magnesium-Vitamin D (CALCIUM 500 PO) Take 500 mg by mouth daily.    . cephALEXin (KEFLEX) 250 MG capsule Take 250 mg by mouth at bedtime.    . citalopram (CELEXA) 20 MG tablet Take 20 mg by mouth daily.     Marland Kitchen dexlansoprazole (DEXILANT) 60 MG capsule Take 1 capsule (60 mg total) by mouth daily. 90 capsule 3  . ESTRACE VAGINAL 0.1 MG/GM vaginal  cream Place 1 Applicatorful vaginally every Monday, Wednesday, and Friday.     Marland Kitchen HYDROcodone-homatropine (HYCODAN) 5-1.5 MG/5ML syrup TAKE 5 MLS BY MOUTH AT BEDTIME AS NEEDED FOR SEVERE COUGH  0  . metFORMIN (GLUCOPHAGE-XR) 500 MG 24 hr tablet Take 500 mg by mouth daily with breakfast.     . metoprolol tartrate (LOPRESSOR) 25 MG tablet Take 25 mg by mouth daily.     . pravastatin (PRAVACHOL) 40 MG tablet Take 40 mg by mouth at bedtime.     . Probiotic Product (PROBIOTIC PO) Take 1 capsule by mouth daily.     No current facility-administered medications for this visit.    Review of Systems  Constitutional: Negative.  Negative for weight loss.       No loss of appetite  HENT: Negative.   Eyes:  Negative.   Respiratory: Negative.  Negative for shortness of breath.   Cardiovascular: Negative.  Negative for chest pain.  Gastrointestinal: Negative.  Negative for abdominal pain.  Genitourinary: Negative.   Musculoskeletal: Positive for neck pain (R side).       R sided jaw pain  Skin: Negative.   Neurological: Negative.   Endo/Heme/Allergies: Negative.   Psychiatric/Behavioral: Negative.   All other systems reviewed and are negative. 14 point review of systems was performed and is negative except as detailed under history of present illness and above  PHYSICAL EXAMINATION: ECOG PERFORMANCE STATUS: 0 - Asymptomatic  Vitals:   01/07/17 1121  BP: 131/64  Pulse: 70  Resp: 18  Temp: 98.3 F (36.8 C)   Filed Weights   01/07/17 1121  Weight: 170 lb 9.6 oz (77.4 kg)    Physical Exam  Constitutional: She is oriented to person, place, and time and well-developed, well-nourished, and in no distress.  HENT:  Head: Normocephalic and atraumatic.  Nose: Nose normal.  Mouth/Throat: Oropharynx is clear and moist. No oropharyngeal exudate.  Eyes: Conjunctivae and EOM are normal. Pupils are equal, round, and reactive to light. Right eye exhibits no discharge. Left eye exhibits no discharge. No  scleral icterus.  Neck: Normal range of motion. Neck supple. No tracheal deviation present. No thyromegaly present.  Cardiovascular: Normal rate, regular rhythm and normal heart sounds.  Exam reveals no gallop and no friction rub.   No murmur heard. Pulmonary/Chest: Effort normal and breath sounds normal. She has no wheezes. She has no rales.  Abdominal: Soft. Bowel sounds are normal. She exhibits no distension and no mass. There is no tenderness. There is no rebound and no guarding.  Musculoskeletal: Normal range of motion. She exhibits no edema.  Lymphadenopathy:    She has no cervical adenopathy.  Neurological: She is alert and oriented to person, place, and time. She has normal reflexes. No cranial nerve deficit. Gait normal. Coordination normal.  Skin: Skin is warm and dry. No rash noted.  Psychiatric: Mood, memory, affect and judgment normal.  Nursing note and vitals reviewed.  LABORATORY DATA:  I have reviewed the data as listed CBC Latest Ref Rng & Units 01/07/2017 09/02/2016 06/04/2016  WBC 4.0 - 10.5 K/uL 6.8 5.2 6.5  Hemoglobin 12.0 - 15.0 g/dL 12.6 13.4 13.1  Hematocrit 36.0 - 46.0 % 39.0 41.7 39.6  Platelets 150 - 400 K/uL 213 225 223   CMP Latest Ref Rng & Units 01/07/2017 09/02/2016 06/04/2016  Glucose 65 - 99 mg/dL 195(H) 199(H) 114(H)  BUN 6 - 20 mg/dL '12 13 13  '$ Creatinine 0.44 - 1.00 mg/dL 0.82 0.75 0.82  Sodium 135 - 145 mmol/L 134(L) 133(L) 137  Potassium 3.5 - 5.1 mmol/L 4.1 4.0 4.8  Chloride 101 - 111 mmol/L 101 101 104  CO2 22 - 32 mmol/L '27 24 30  '$ Calcium 8.9 - 10.3 mg/dL 9.2 9.1 9.4  Total Protein 6.5 - 8.1 g/dL 7.3 7.8 8.0  Total Bilirubin 0.3 - 1.2 mg/dL 0.3 0.4 0.4  Alkaline Phos 38 - 126 U/L 69 70 82  AST 15 - 41 U/L '23 25 29  '$ ALT 14 - 54 U/L '17 18 23   '$ RADIOGRAPHIC STUDIES: I have reviewed the imaging below.   CT Chest wo Contrast 09/02/2016 IMPRESSION: 1. The left lower lobe pulmonary nodule is similar, 6 mm. Favored to represent a subpleural lymph node.  Given the extent of emphysema, consider follow-up in May of 2018, to confirm 1 year stability. This  recommendation follows the consensus statement: Guidelines for Management of Small Pulmonary Nodules Detected on CT Images: From the Fleischner Society 2017; Radiology 2017; 284:228-243. 2. No thoracic adenopathy. 3.  Coronary artery atherosclerosis. Aortic atherosclerosis. 4. Moderate to large hiatal hernia. Esophageal air fluid level suggests dysmotility or gastroesophageal reflux.  PATHOLOGY:    ASSESSMENT & PLAN:  NHL, Malt Lymphoma H. Pylori Negative Tobacco Use LLL pulmonary nodule  She is doing well.  She will continue follow-up with Dr. Gala Romney, I advised her get a yearly EGD.  CT was reviewed with the patient, results are noted above. Pulmonary nodule in the LLL is stable. Repeat CT imaging scheduled for 03/02/17. She will return for follow up in 3 months to review CT chest results.   No orders of the defined types were placed in this encounter.   All questions were answered. The patient knows to call the clinic with any problems, questions or concerns.  This document serves as a record of services personally performed by Twana First, MD. It was created on her behalf by Martinique Casey, a trained medical scribe. The creation of this record is based on the scribe's personal observations and the provider's statements to them. This document has been checked and approved by the attending provider.  I have reviewed the above documentation for accuracy and completeness, and I agree with the above.  This note was electronically signed.    Martinique M Casey  01/07/2017 11:34 AM

## 2017-01-11 ENCOUNTER — Telehealth: Payer: Self-pay | Admitting: Internal Medicine

## 2017-01-11 NOTE — Telephone Encounter (Signed)
Pt called asking for LSL. I told her LSL was with patients and I could take a message. She said that she had seen her oncologist on Friday and was recommended to have another EGD. I told her that I would let the nurse be aware.

## 2017-01-11 NOTE — Telephone Encounter (Signed)
I am asking Kirby Crigler, PA about this. I will let you know.

## 2017-01-11 NOTE — Telephone Encounter (Signed)
Routing to AB- she saw pt last in the office.

## 2017-01-12 NOTE — Telephone Encounter (Signed)
Called and informed pt of AB recommending EGD in 1 year from last procedure (last EGD was 09/2016).  Please nic for EGD in Nov 2018.

## 2017-01-12 NOTE — Telephone Encounter (Signed)
Will need annual EGDs. So, whenever her last one was, will plan on it yearly from that date.

## 2017-01-31 ENCOUNTER — Other Ambulatory Visit: Payer: Self-pay

## 2017-01-31 MED ORDER — PANTOPRAZOLE SODIUM 40 MG PO TBEC: 40.0000 mg | DELAYED_RELEASE_TABLET | Freq: Every day | ORAL | 3 refills | Status: DC

## 2017-03-02 ENCOUNTER — Ambulatory Visit (HOSPITAL_COMMUNITY)
Admission: RE | Admit: 2017-03-02 | Discharge: 2017-03-02 | Disposition: A | Discharge status: Home / Self Care | Payer: Medicare Other | Source: Ambulatory Visit | Attending: Oncology | Admitting: Oncology

## 2017-03-02 DIAGNOSIS — Z8572 Personal history of non-Hodgkin lymphomas: Secondary | ICD-10-CM | POA: Insufficient documentation

## 2017-03-02 DIAGNOSIS — K449 Diaphragmatic hernia without obstruction or gangrene: Secondary | ICD-10-CM | POA: Insufficient documentation

## 2017-03-02 DIAGNOSIS — I700 Atherosclerosis of aorta: Secondary | ICD-10-CM | POA: Diagnosis not present

## 2017-03-02 DIAGNOSIS — I2510 Atherosclerotic heart disease of native coronary artery without angina pectoris: Secondary | ICD-10-CM | POA: Insufficient documentation

## 2017-03-02 DIAGNOSIS — J432 Centrilobular emphysema: Secondary | ICD-10-CM | POA: Insufficient documentation

## 2017-03-02 DIAGNOSIS — R911 Solitary pulmonary nodule: Secondary | ICD-10-CM | POA: Diagnosis not present

## 2017-03-02 DIAGNOSIS — R918 Other nonspecific abnormal finding of lung field: Secondary | ICD-10-CM | POA: Diagnosis not present

## 2017-03-09 DIAGNOSIS — Z85828 Personal history of other malignant neoplasm of skin: Secondary | ICD-10-CM | POA: Diagnosis not present

## 2017-03-09 DIAGNOSIS — L570 Actinic keratosis: Secondary | ICD-10-CM | POA: Diagnosis not present

## 2017-03-21 DIAGNOSIS — N3020 Other chronic cystitis without hematuria: Secondary | ICD-10-CM | POA: Diagnosis not present

## 2017-04-04 ENCOUNTER — Ambulatory Visit (HOSPITAL_COMMUNITY): Payer: Medicare Other | Admitting: Oncology

## 2017-04-06 ENCOUNTER — Encounter (HOSPITAL_COMMUNITY): Payer: Medicare Other | Attending: Oncology | Admitting: Adult Health

## 2017-04-06 ENCOUNTER — Encounter (HOSPITAL_COMMUNITY): Payer: Self-pay | Admitting: Adult Health

## 2017-04-06 VITALS — BP 150/66 | HR 73 | Temp 97.5°F | Resp 16 | Ht 66.0 in | Wt 171.0 lb

## 2017-04-06 DIAGNOSIS — F17200 Nicotine dependence, unspecified, uncomplicated: Secondary | ICD-10-CM | POA: Diagnosis not present

## 2017-04-06 DIAGNOSIS — C884 Extranodal marginal zone B-cell lymphoma of mucosa-associated lymphoid tissue [MALT-lymphoma]: Secondary | ICD-10-CM | POA: Diagnosis not present

## 2017-04-06 DIAGNOSIS — R918 Other nonspecific abnormal finding of lung field: Secondary | ICD-10-CM

## 2017-04-06 MED ORDER — PEGFILGRASTIM INJECTION 6 MG/0.6ML ~~LOC~~
PREFILLED_SYRINGE | SUBCUTANEOUS | Status: AC
  Filled 2017-04-06: qty 0.6

## 2017-04-06 NOTE — Progress Notes (Addendum)
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 Gala Romney, abnormal gastric mucosa. Pathology with EXTRANODAL Marginal zone lymphoma. NEGATIVE for H. Pylori      03/03/2016 Miscellaneous    H pylori stool antigen NEGATIVE      03/03/2016 PET scan    Focal area of hypermetabolism and wall thickening involving the body antral junction region of the stomach c/w history of lymphoma. 5.5 mm LLL pulm nodule not hypermetabolic. Non contrast chest CT in 6 month      03/18/2016 - 04/08/2016 Radiation Therapy    The gastric tumor received 30 Gy in 15 fractions of 2 Gy        INTERVAL HISTORY:  Monica [name] 76 y.o. female presents for routine follow-up for history of Stage I gastric MALT lymphoma.    She is here today with her husband.  Overall, she tells me that she feels "pretty good."  Both her appetite and energy levels are 75%.  She is supplementing her diet with Boost a few times per week. Chart reviewed; she has actually gained about 6 lbs since 09/2016.   Denies any abdominal pain, nausea, vomiting, or bowel changes.  Endorses frequent burping and flatulence. She remains on Protonix daily. Her last follow-up with GI (Dr. Roseanne Kaufman team) was in 09/2016 with EGD. She would like to know when she needs to go back to see Dr. Gala Romney.   She recently had CT chest for lung nodules. She occasionally has cough and increased sputum. She continues to smoke cigarettes; smoking about 3/4 pack per day. Biggest triggers to smoke are after meals and "to help my nerves."   Otherwise, she is largely without complaints today.    REVIEW OF  SYSTEMS:  Review of Systems  Constitutional: Positive for fatigue. Negative for chills and fever.  HENT:  Negative.  Negative for lump/mass and nosebleeds.   Eyes: Negative.   Respiratory: Negative.  Negative for cough and shortness of breath.   Cardiovascular: Negative.  Negative for chest pain and leg swelling.  Gastrointestinal: Negative.  Negative for abdominal pain, blood in stool, constipation, diarrhea, nausea and vomiting.  Endocrine: Negative.   Genitourinary: Negative.  Negative for dysuria and hematuria.        Recently completed treatment for UTI (managed by outside provider).   Musculoskeletal: Negative.  Negative for arthralgias.  Skin: Negative.  Negative for rash.  Neurological: Negative.  Negative for dizziness and headaches.  Hematological: Negative.  Negative for adenopathy. Does not bruise/bleed easily.  Psychiatric/Behavioral: Negative.  Negative for depression and sleep disturbance. The patient is not nervous/anxious.      PAST MEDICAL/SURGICAL HISTORY:  Past Medical History:  Diagnosis Date  . Cancer (HCC)    NHL, Malt Lymphoma  . Depression   . DM (diabetes mellitus) ([facility])   . Dysphagia   . Dysrhythmia    History of palpatations  . Heart palpitations   . Hiatal hernia   . Hyperlipidemia   . Interstitial cystitis   . Sinusitis    Past Surgical History:  Procedure Laterality Date  .  BIOPSY  09/01/2016   Procedure: BIOPSY;  Surgeon: Daneil Dolin, MD;  [facility];  Service: Endoscopy;;  gastric  . COLONOSCOPY     Dr. Lindalou Hose 2009: Normal per PCP notes  . ESOPHAGOGASTRODUODENOSCOPY     RMR: Prominant Schzgzki ring/component of peptic stricture status post dilation and disruption as described above, otherwise norma esophagus, moderate-sized hiatal hernia, antal pyloric channel, and posterier bulbar erosions, otherwise unremarkable stomach, D1 and D2 . Inflammatory findings on the stomach and duodenum will likely be related to aspirin effect.  We need to rule out Helicobacter pylor  . ESOPHAGOGASTRODUODENOSCOPY N/A 03/10/2015   Dr. 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 Pathology with lymphoid population of stomach, slight atypia  . ESOPHAGOGASTRODUODENOSCOPY N/A 02/03/2016   Dr. Gala Romney: Schatzki ring noted at GE junction, dilated with 46 and then 28 [place] dilator. Large hiatal hernia. 6 x 7 cm nodular geographically ulcerated mucosa, biopsy c/w MALToma  . ESOPHAGOGASTRODUODENOSCOPY N/A 04/08/2016   Dr. Gala Romney: moderate Schatzki's ring/web s/p dilation, large hiatal hernia, localized area of gastric lymphoma visualized and appeared to be much improved. normal second portion of the duodenum. No specimens collected. Esophageal lumen notably tighted up significantly since her dilation in April of this year.   . ESOPHAGOGASTRODUODENOSCOPY N/A 08/04/2016   Procedure: ESOPHAGOGASTRODUODENOSCOPY (EGD);  Surgeon: Daneil Dolin, MD;  [facility];  Service: Endoscopy;  Laterality: N/A;  7:30 am  . ESOPHAGOGASTRODUODENOSCOPY N/A 09/01/2016   Procedure: ESOPHAGOGASTRODUODENOSCOPY (EGD);  Surgeon: Daneil Dolin, MD;  [facility];  Service: Endoscopy;  Laterality: N/A;  830   . MALONEY DILATION N/A 03/10/2015   Procedure: Venia Minks DILATION;  Surgeon: Daneil Dolin, MD;  [facility];  Service: Endoscopy;  Laterality: N/A;  . Venia Minks DILATION N/A 02/03/2016   Procedure: Venia Minks DILATION;  Surgeon: Daneil Dolin, MD;  [facility];  Service: Endoscopy;  Laterality: N/A;  . Venia Minks DILATION N/A 04/08/2016   Procedure: Venia Minks DILATION;  Surgeon: Daneil Dolin, MD;  [facility];  Service: Endoscopy;  Laterality: N/A;     SOCIAL HISTORY:  Social History   Social History  . Marital status: Married    Spouse name: N/A  . Number of children: N/A  . Years of education: N/A   Occupational History  . Not on file.   Social  History Main Topics  . Smoking status: Current Every Day Smoker    Packs/day: 0.75    Years: 57.00    Types: Cigarettes  . Smokeless tobacco: Never Used  . Alcohol use No  . Drug use: No  . Sexual activity: Not on file   Other Topics Concern  . Not on file   Social History Narrative  . No narrative on file    FAMILY HISTORY:  Family History  Problem Relation Age of Onset  . Heart attack Father   . Dementia Father   . Diabetes Sister   . Diabetes Brother   . Diabetes Sister   . Colon cancer Neg Hx     CURRENT MEDICATIONS:  Outpatient Encounter Prescriptions as of 04/06/2017  Medication Sig  . aspirin 81 MG tablet Take 81 mg by mouth daily.  . Calcium-Magnesium-Vitamin D (CALCIUM 500 PO) Take 500 mg by mouth daily.  . cephALEXin (KEFLEX) 250 MG capsule Take 250 mg by mouth at bedtime.  . citalopram (CELEXA) 20 MG tablet Take 20 mg by mouth daily.   Marland Kitchen  ESTRACE VAGINAL 0.1 MG/GM vaginal cream Place 1 Applicatorful vaginally every Monday, Wednesday, and Friday.   Marland Kitchen HYDROcodone-homatropine (HYCODAN) 5-1.5 MG/5ML syrup TAKE 5 MLS BY MOUTH AT BEDTIME AS NEEDED FOR SEVERE COUGH  . metFORMIN (GLUCOPHAGE-XR) 500 MG 24 hr tablet Take 500 mg by mouth daily with breakfast.   . metoprolol tartrate (LOPRESSOR) 25 MG tablet Take 25 mg by mouth daily.   . pantoprazole (PROTONIX) 40 MG tablet Take 1 tablet (40 mg total) by mouth daily.  . pravastatin (PRAVACHOL) 40 MG tablet Take 40 mg by mouth at bedtime.   . [DISCONTINUED] dexlansoprazole (DEXILANT) 60 MG capsule Take 1 capsule (60 mg total) by mouth daily.  . [DISCONTINUED] Probiotic Product (PROBIOTIC PO) Take 1 capsule by mouth daily.   No facility-administered encounter medications on file as of 04/06/2017.     ALLERGIES:  Allergies  Allergen Reactions  . Sulfonamide Derivatives Hives  . Sulfur      PHYSICAL EXAM:  ECOG Performance status: 0-1 - Mildly symptomatic, but completely independent.   Vitals:   04/06/17 1114  BP:  (!) 150/66  Pulse: 73  Resp: 16  Temp: 97.5 F (36.4 C)   Filed Weights   04/06/17 1114  Weight: 171 lb (77.6 kg)    Physical Exam  Constitutional: She is oriented to person, place, and time and well-developed, well-nourished, and in no distress.  HENT:  Head: Normocephalic.  Mouth/Throat: Oropharynx is clear and moist. No oropharyngeal exudate.  Eyes: Conjunctivae are normal. Pupils are equal, round, and reactive to light. No scleral icterus.  Neck: Normal range of motion. Neck supple.  Cardiovascular: Normal rate, regular rhythm and normal heart sounds.   Pulmonary/Chest: Effort normal. No respiratory distress. She has wheezes (Expiratory wheezes ).  Abdominal: Soft. Bowel sounds are normal. There is no tenderness.  Musculoskeletal: Normal range of motion. She exhibits no edema.  Lymphadenopathy:    She has no cervical adenopathy.       Right: No supraclavicular adenopathy present.       Left: No supraclavicular adenopathy present.  Neurological: She is alert and oriented to person, place, and time. No cranial nerve deficit. Gait normal.  Skin: Skin is warm and dry. No rash noted.  Psychiatric: Mood, memory, affect and judgment normal.  Nursing note and vitals reviewed.    LABORATORY DATA:  I have reviewed the labs as listed.  CBC    Component Value Date/Time   WBC 6.8 01/07/2017 1036   RBC 4.44 01/07/2017 1036   HGB 12.6 01/07/2017 1036   HCT 39.0 01/07/2017 1036   PLT 213 01/07/2017 1036   MCV 87.8 01/07/2017 1036   MCH 28.4 01/07/2017 1036   MCHC 32.3 01/07/2017 1036   RDW 15.6 (H) 01/07/2017 1036   LYMPHSABS 1.7 01/07/2017 1036   MONOABS 0.7 01/07/2017 1036   EOSABS 0.1 01/07/2017 1036   BASOSABS 0.0 01/07/2017 1036   CMP Latest Ref Rng & Units 01/07/2017 09/02/2016 06/04/2016  Glucose 65 - 99 mg/dL 195(H) 199(H) 114(H)  BUN 6 - 20 mg/dL 12 13 13   Creatinine 0.44 - 1.00 mg/dL 0.82 0.75 0.82  Sodium 135 - 145 mmol/L 134(L) 133(L) 137  Potassium 3.5 - 5.1  mmol/L 4.1 4.0 4.8  Chloride 101 - 111 mmol/L 101 101 104  CO2 22 - 32 mmol/L 27 24 30   Calcium 8.9 - 10.3 mg/dL 9.2 9.1 9.4  Total Protein 6.5 - 8.1 g/dL 7.3 7.8 8.0  Total Bilirubin 0.3 - 1.2 mg/dL 0.3 0.4 0.4  Alkaline  Phos 38 - 126 U/L 69 70 82  AST 15 - 41 U/L 23 25 29   ALT 14 - 54 U/L 17 18 23     PENDING LABS:    DIAGNOSTIC IMAGING:  *The following radiologic images and reports have been reviewed independently and agree with below findings.  CT chest: 03/02/17      PATHOLOGY:  Last EGD: 09/01/16           ASSESSMENT & PLAN:   Stage I gastric MALT lymphoma:  -Diagnosed in 01/2016. Treated with radiation alone from 03/18/16-04/08/16.  -Post-treatment EGD performed by Dr. Gala Romney in 09/2016 revealed chronic gastritis, but no evidence of lymphoid cells and H.pylori negative.  -NCCN Guidelines reviewed with patient, which recommends clinical follow-up every 3-6 months for the first 5 years, then annually thereafter.  She will also require periodic EGD evaluation with GI.  -Will refer her back to Dr. Gala Romney to have EGD sometime in 05/2017 for continued surveillance.   -Return to cancer center in 6 months for continued follow-up. Recommended continued follow-up with Dr. Gala Romney (likely every 6 months) alternating with oncology, so that she is seen approximately every 3 months. We recommend closer follow-up for the first 2 years post-treatment, as this is generally the timeframe for increased recurrence. After 2 years, then may be able to extend her follow-up to annually at cancer center and annually with GI.       Lung nodules:  -The recent CT chest completed on 03/02/17 revealed a stable left-sided pulmonary nodules, and are considered benign given the chronicity of their stability. I reviewed these results in detail with the patient today. There are no other suspicious or enlarging pulmonary nodules noted on imaging. 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 We discussed tobacco cessation today (see below). -No need for continued CT chest serial imaging. Can repeat CT imaging in the future if clinically indicated for new or worsening pulmonary symptoms.  Tobacco use disorder:  -We discussed the pathophysiology of nicotine dependence and different strategies to stop smoking. The gold standard of tobacco cessation is nicotine replacement (with nicotine patches & gum/lozenges) or Varenicline (Chantix).  We discussed that the typical craving/urge to smoke lasts about 3-5 minutes; her biggest trigger(s) to use cigarettes is stress and after meals.  Encouraged her to set some new "rules" that do not allow her to smoke after meals and use distraction during times of stress. Also, could use a piece of nicotine gum or a lozenge when she has a craving. I gave her instructions on how to use these nicotine replacement products.  Monica [name]  understands that data suggests that "cold [place]" is the least effective way to stop using tobacco products.  Having a clear "quit plan" is much more effective and requires a step-wise approach with continued support from a tobacco treatment specialist.  I encouraged her to reach out to me if she has further questions or interest in her continued cessation efforts.  Greater than 10 minutes was spent in smoking cessation counseling with this patient.       Dispo:  -Refer back to Dr. Gala Romney for consideration for surveillance EGD sometime in 05/2017.   -Return to cancer center in 6 months for continued follow-up.    All questions were answered to patient's stated satisfaction. Encouraged patient to call with any new concerns or questions before her next visit to the cancer center and we can certain see her sooner,  if needed.    Plan of care discussed with Dr. Talbert Cage, who agrees with the above aforementioned.    A total of 30 minutes was spent in face-to-face care of this patient,  with greater than 50% of that time spent in counseling and care-coordination.    Orders placed this encounter:  No orders of the defined types were placed in this encounter.     Mike Craze, NP [facility] [phone]

## 2017-04-06 NOTE — Patient Instructions (Signed)
[facility] at [facility] Discharge Instructions  RECOMMENDATIONS MADE BY THE CONSULTANT AND ANY TEST RESULTS WILL BE SENT TO YOUR REFERRING PHYSICIAN.  You were seen today by Gretchen Dawson NP.   Thank you for choosing [facility] at [facility] to provide your oncology and hematology care.  To afford each patient quality time with our provider, please arrive at least 15 minutes before your scheduled appointment time.    If you have a lab appointment with the Cancer Center please come in thru the  Main Entrance and check in at the main information desk  You need to re-schedule your appointment should you arrive 10 or more minutes late.  We strive to give you quality time with our providers, and arriving late affects you and other patients whose appointments are after yours.  Also, if you no show three or more times for appointments you may be dismissed from the clinic at the providers discretion.     Again, thank you for choosing [facility].  Our hope is that these requests will decrease the amount of time that you wait before being seen by our physicians.       _____________________________________________________________  Should you have questions after your visit to  Cancer Center, please contact our office at [phone] between the hours of 8:30 a.m. and 4:30 p.m.  Voicemails left after 4:30 p.m. will not be returned until the following business day.  For prescription refill requests, have your pharmacy contact our office.       Resources For Cancer Patients and their Caregivers ? American Cancer Society: Can assist with transportation, wigs, general needs, runs Look Good Feel Better.        1-888-227-6333 ? Cancer Care: Provides financial assistance, online support groups, medication/co-pay assistance.  1-800-813-HOPE (4673) ? [facility] Assists [place] cancer patients and  their families through emotional , educational and financial support.  336-427-4357 ? [place] DSS Where to apply for food stamps, Medicaid and utility assistance. 336-342-1394 ? [facility]: Transportation to medical appointments. 336-347-2287 ? Social Security Administration: May apply for disability if have a Stage IV cancer. 336-342-7796 1-800-772-1213 ? [place] Aging, Disability and Transit Services: Assists with nutrition, care and transit needs. 336-349-2343  Cancer Center Support Programs: @10RELATIVEDAYS@ > Cancer Support Group  2nd Tuesday of the month 1pm-2pm, Journey Room  > Creative Journey  3rd Tuesday of the month 1130am-1pm, Journey Room  > Look Good Feel Better  1st Wednesday of the month 10am-12 noon, Journey Room (Call American Cancer Society to register 1-800-395-5775)    

## 2017-04-07 ENCOUNTER — Encounter: Payer: Self-pay | Admitting: Internal Medicine

## 2017-04-08 ENCOUNTER — Ambulatory Visit (HOSPITAL_COMMUNITY): Payer: Medicare Other

## 2017-04-27 DIAGNOSIS — L570 Actinic keratosis: Secondary | ICD-10-CM | POA: Diagnosis not present

## 2017-04-27 DIAGNOSIS — D485 Neoplasm of uncertain behavior of skin: Secondary | ICD-10-CM | POA: Diagnosis not present

## 2017-05-02 ENCOUNTER — Ambulatory Visit (INDEPENDENT_AMBULATORY_CARE_PROVIDER_SITE_OTHER): Payer: Medicare Other | Admitting: Gastroenterology

## 2017-05-02 ENCOUNTER — Other Ambulatory Visit: Payer: Self-pay

## 2017-05-02 ENCOUNTER — Encounter: Payer: Self-pay | Admitting: Gastroenterology

## 2017-05-02 VITALS — BP 124/63 | HR 72 | Temp 97.6°F | Ht 66.0 in | Wt 170.0 lb

## 2017-05-02 DIAGNOSIS — R1310 Dysphagia, unspecified: Secondary | ICD-10-CM

## 2017-05-02 DIAGNOSIS — C884 Extranodal marginal zone b-cell lymphoma of mucosa-associated lymphoid tissue (malt-lymphoma) not having achieved remission: Secondary | ICD-10-CM

## 2017-05-02 DIAGNOSIS — R1319 Other dysphagia: Secondary | ICD-10-CM

## 2017-05-02 DIAGNOSIS — K222 Esophageal obstruction: Secondary | ICD-10-CM

## 2017-05-02 NOTE — Progress Notes (Signed)
Primary Care Physician:  [name]., MD  Primary Gastroenterologist:  Garfield Cornea, MD   Chief Complaint  Patient presents with  . Schatzki's ring    f/u, gets choked on first bite  . Dysphagia    HPI:  Monica [name] is a 76 y.o. female here for dysphagia. Patient has a history of stage I gastric MALT lymphoma. Diagnosed at time of EGD in April 2017. Treated with radiation alone from May to June 2017. Posttreatment EGD November 2017 revealed chronic gastritis but no evidence of lymphoid cells. H. pylori negative.  Patient has a long history of dysphagia requiring multiple esophageal dilations. Her last esophageal dilation was in June 2017 at which time she reports good response. She had a Schatzki ring and mucosal web noted at that time. With the first bite she feels like food sticks. Sometimes it comes back up but most of the time she can wash it down. No heartburn. A lot of belching. Still on pantoprazole. Also complains of a lot of gas. Flatulence and belching. Bowel movements regular. No melena rectal bleeding. Gas issues seem to occur after her radiation treatments.  Denies unintentional weight loss.  According to oncology notes, they would like [place] to see the patient every 6 months, alternating with their schedule so that the patient gets seen every 3 months by gastroenterology or oncology. We will see her back in September 2018 and then every 6 months afterwards.  Current Outpatient Prescriptions  Medication Sig Dispense Refill  . aspirin 81 MG tablet Take 81 mg by mouth daily.    . Calcium-Magnesium-Vitamin D (CALCIUM 500 PO) Take 500 mg by mouth daily.    . citalopram (CELEXA) 20 MG tablet Take 20 mg by mouth daily.     Marland Kitchen ESTRACE VAGINAL 0.1 MG/GM vaginal cream Place 1 Applicatorful vaginally 2 (two) times a week.     Marland Kitchen HYDROcodone-homatropine (HYCODAN) 5-1.5 MG/5ML syrup TAKE 5 MLS BY MOUTH AT BEDTIME AS NEEDED FOR SEVERE COUGH  0  . metFORMIN (GLUCOPHAGE-XR) 500 MG 24 hr  tablet Take 500 mg by mouth daily with breakfast.     . metoprolol tartrate (LOPRESSOR) 25 MG tablet Take 25 mg by mouth daily.     . pantoprazole (PROTONIX) 40 MG tablet Take 1 tablet (40 mg total) by mouth daily. 90 tablet 3  . pravastatin (PRAVACHOL) 40 MG tablet Take 40 mg by mouth at bedtime.      No current facility-administered medications for this visit.     Allergies as of 05/02/2017 - Review Complete 05/02/2017  Allergen Reaction Noted  . Sulfonamide derivatives Hives   . Sulfur  06/04/2016    Past Medical History:  Diagnosis Date  . Cancer (HCC)    NHL, Malt Lymphoma  . Depression   . DM (diabetes mellitus) ([place])   . Dysphagia   . Dysrhythmia    History of palpatations  . Heart palpitations   . Hiatal hernia   . Hyperlipidemia   . Interstitial cystitis   . 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 Sinusitis     Past Surgical History:  Procedure Laterality Date  . BIOPSY  09/01/2016   Procedure: BIOPSY;  Surgeon: Daneil Dolin, MD;  Location: [facility];  Service: Endoscopy;;  gastric  . COLONOSCOPY     Dr. Lindalou Hose 2009: Normal per PCP notes  . ESOPHAGOGASTRODUODENOSCOPY     RMR: Prominant Schzgzki ring/component of peptic stricture status post dilation and disruption as  described above, otherwise norma esophagus, moderate-sized hiatal hernia, antal pyloric channel, and posterier bulbar erosions, otherwise unremarkable stomach, D1 and D2 . Inflammatory findings on the stomach and duodenum will likely be related to aspirin effect. We need to rule out Helicobacter pylor  . ESOPHAGOGASTRODUODENOSCOPY N/A 03/10/2015   Dr. 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 Pathology with lymphoid population of stomach, slight atypia  . ESOPHAGOGASTRODUODENOSCOPY N/A 02/03/2016   Dr. Gala Romney: Schatzki ring noted at GE junction, dilated with 36 and then 21 [place] dilator. Large hiatal  hernia. 6 x 7 cm nodular geographically ulcerated mucosa, biopsy c/w MALToma  . ESOPHAGOGASTRODUODENOSCOPY N/A 04/08/2016   Dr. Gala Romney: moderate Schatzki's ring/web s/p dilation, large hiatal hernia, localized area of gastric lymphoma visualized and appeared to be much improved. normal second portion of the duodenum. No specimens collected. Esophageal lumen notably tighted up significantly since her dilation in April of this year.   . ESOPHAGOGASTRODUODENOSCOPY N/A 08/04/2016   Procedure: ESOPHAGOGASTRODUODENOSCOPY (EGD);  Surgeon: Daneil Dolin, MD;  Location: [facility];  Service: Endoscopy;  Laterality: N/A;  7:30 am  . ESOPHAGOGASTRODUODENOSCOPY N/A 09/01/2016   Procedure: ESOPHAGOGASTRODUODENOSCOPY (EGD);  Surgeon: Daneil Dolin, MD;  [facility]: [facility];  Service: Endoscopy;  Laterality: N/A;  830   . MALONEY DILATION N/A 03/10/2015   Procedure: Venia Minks DILATION;  Surgeon: Daneil Dolin, MD;  [facility]: [facility];  Service: Endoscopy;  Laterality: N/A;  . Venia Minks DILATION N/A 02/03/2016   Procedure: Venia Minks DILATION;  Surgeon: Daneil Dolin, MD;  [facility]: [facility];  Service: Endoscopy;  Laterality: N/A;  . Venia Minks DILATION N/A 04/08/2016   Procedure: Venia Minks DILATION;  Surgeon: Daneil Dolin, MD;  Location: [facility];  Service: Endoscopy;  Laterality: N/A;    Family History  Problem Relation Age of Onset  . Heart attack Father   . Dementia Father   . Diabetes Sister   . Diabetes Brother   . Diabetes Sister   . Colon cancer Neg Hx     Social History   Social History  . Marital status: Married    Spouse name: N/A  . Number of children: N/A  . Years of education: N/A   Occupational History  . Not on file.   Social History Main Topics  . Smoking status: Current Every Day Smoker    Packs/day: 0.75    Years: 57.00    Types: Cigarettes  . Smokeless tobacco: Never Used     Comment: trying to quit, wearing patch  . Alcohol use No  . Drug use: No  . Sexual  activity: Not on file   Other Topics Concern  . Not on file   Social History Narrative  . No narrative on file      ROS:  General: Negative for anorexia, weight loss, fever, chills, fatigue, weakness. Eyes: Negative for vision changes.  ENT: Negative for hoarseness,  nasal congestion.See history of present illness CV: Negative for chest pain, angina, palpitations, dyspnea on exertion, peripheral edema.  Respiratory: Negative for dyspnea at rest, dyspnea on exertion,  sputum, wheezing. Positive chronic cough GI: See history of present illness. GU:  Negative for dysuria, hematuria, urinary incontinence, urinary frequency, nocturnal urination.  MS: Negative for joint pain, low back pain.  Derm: Negative for rash or itching.  Neuro: Negative for weakness, abnormal sensation, seizure, frequent headaches, memory loss, confusion.  Psych: Negative for anxiety, depression, suicidal ideation, hallucinations.  Endo: Negative for  unusual weight change.  Heme: Negative for bruising or bleeding. Allergy: Negative for rash or hives.    Physical Examination:  BP 124/63   Pulse 72   Temp 97.6 F (36.4 C) (Oral)   Ht 5\' 6"  (1.676 m)   Wt 170 lb (77.1 kg)   BMI 27.44 kg/m    General: Well-nourished, well-developed in no acute distress.  Head: Normocephalic, atraumatic.   Eyes: Conjunctiva pink, no icterus. Mouth: Oropharyngeal mucosa moist and pink , no lesions erythema or exudate. Neck: Supple without thyromegaly, masses, or lymphadenopathy.  Lungs: Clear to auscultation bilaterally.  Heart: Regular rate and rhythm, no murmurs rubs or gallops.  Abdomen: Bowel sounds are normal, nontender, nondistended, no hepatosplenomegaly or masses, no abdominal bruits or    hernia , no rebound or guarding.   Rectal: Not performed Extremities: No lower extremity edema. No clubbing or deformities.  Neuro: Alert and oriented x 4 , grossly normal neurologically.  Skin: Warm and dry, no rash or  jaundice.   Psych: Alert and cooperative, normal mood and affect.  Labs: Lab Results  Component Value Date   CREATININE 0.82 01/07/2017   BUN 12 01/07/2017   NA 134 (L) 01/07/2017   K 4.1 01/07/2017   CL 101 01/07/2017   CO2 27 01/07/2017   Lab Results  Component Value Date   WBC 6.8 01/07/2017   HGB 12.6 01/07/2017   HCT 39.0 01/07/2017   MCV 87.8 01/07/2017   PLT 213 01/07/2017   Lab Results  Component Value Date   ALT 17 01/07/2017   AST 23 01/07/2017   ALKPHOS 69 01/07/2017   BILITOT 0.3 01/07/2017     Imaging Studies: No results found.

## 2017-05-02 NOTE — Patient Instructions (Signed)
1. Upper endoscopy as scheduled. Please see separate instructions. 2. We will plan to see back in September and then every 6 months after that at the request of oncology.

## 2017-05-02 NOTE — Assessment & Plan Note (Signed)
76 year old female with recurrent esophageal dysphagia, history of Schatzki rings requiring dilation who presents for dysphagia. She also has a history of stage I gastric MALT lymphoma. Radiation therapy completed June 2017. Oncology requesting EGD for follow-up at this time. She also needs consideration of esophageal dilation given recurrent dysphagia. Last EGD November 2017 with mild Schatzki ring at that time, not dilated.  Plan on EGD with dilation near future with Dr. Gala Romney.  I have discussed the risks, alternatives, benefits with regards to but not limited to the risk of reaction to medication, bleeding, infection, perforation and the patient is agreeable to proceed. Written consent to be obtained.  Continue pantoprazole 40 mg daily. Further recommendations to follow.  As noted above, we will bring her back in September 2018 for follow-up. Plan on seeing her every 6 months after that per oncology request said that the patient is being seen every 3 months by gastroenterology or oncology.

## 2017-05-02 NOTE — Progress Notes (Signed)
CC'D TO PCP °

## 2017-05-10 ENCOUNTER — Encounter (HOSPITAL_COMMUNITY)
Admission: RE | Disposition: A | Discharge status: Home / Self Care | Payer: Self-pay | Source: Ambulatory Visit | Attending: Internal Medicine

## 2017-05-10 ENCOUNTER — Ambulatory Visit (HOSPITAL_COMMUNITY)
Admission: RE | Admit: 2017-05-10 | Discharge: 2017-05-10 | Disposition: A | Discharge status: Home / Self Care | Payer: Medicare Other | Source: Ambulatory Visit | Attending: Internal Medicine | Admitting: Internal Medicine

## 2017-05-10 ENCOUNTER — Encounter (HOSPITAL_COMMUNITY): Payer: Self-pay | Admitting: *Deleted

## 2017-05-10 DIAGNOSIS — Z79899 Other long term (current) drug therapy: Secondary | ICD-10-CM | POA: Insufficient documentation

## 2017-05-10 DIAGNOSIS — R1310 Dysphagia, unspecified: Secondary | ICD-10-CM

## 2017-05-10 DIAGNOSIS — F329 Major depressive disorder, single episode, unspecified: Secondary | ICD-10-CM | POA: Insufficient documentation

## 2017-05-10 DIAGNOSIS — Z7982 Long term (current) use of aspirin: Secondary | ICD-10-CM | POA: Diagnosis not present

## 2017-05-10 DIAGNOSIS — E119 Type 2 diabetes mellitus without complications: Secondary | ICD-10-CM | POA: Diagnosis not present

## 2017-05-10 DIAGNOSIS — Z923 Personal history of irradiation: Secondary | ICD-10-CM | POA: Insufficient documentation

## 2017-05-10 DIAGNOSIS — Z8572 Personal history of non-Hodgkin lymphomas: Secondary | ICD-10-CM | POA: Diagnosis not present

## 2017-05-10 DIAGNOSIS — F17210 Nicotine dependence, cigarettes, uncomplicated: Secondary | ICD-10-CM | POA: Diagnosis not present

## 2017-05-10 DIAGNOSIS — E785 Hyperlipidemia, unspecified: Secondary | ICD-10-CM | POA: Diagnosis not present

## 2017-05-10 DIAGNOSIS — Z882 Allergy status to sulfonamides status: Secondary | ICD-10-CM | POA: Insufficient documentation

## 2017-05-10 DIAGNOSIS — K222 Esophageal obstruction: Secondary | ICD-10-CM | POA: Diagnosis not present

## 2017-05-10 DIAGNOSIS — C884 Extranodal marginal zone b-cell lymphoma of mucosa-associated lymphoid tissue (malt-lymphoma) not having achieved remission: Secondary | ICD-10-CM

## 2017-05-10 DIAGNOSIS — R1319 Other dysphagia: Secondary | ICD-10-CM

## 2017-05-10 DIAGNOSIS — K3189 Other diseases of stomach and duodenum: Secondary | ICD-10-CM | POA: Insufficient documentation

## 2017-05-10 DIAGNOSIS — Z7984 Long term (current) use of oral hypoglycemic drugs: Secondary | ICD-10-CM | POA: Insufficient documentation

## 2017-05-10 DIAGNOSIS — K449 Diaphragmatic hernia without obstruction or gangrene: Secondary | ICD-10-CM | POA: Diagnosis not present

## 2017-05-10 HISTORY — PX: ESOPHAGOGASTRODUODENOSCOPY: SHX5428

## 2017-05-10 HISTORY — PX: MALONEY DILATION: SHX5535

## 2017-05-10 LAB — GLUCOSE, CAPILLARY: Glucose-Capillary: 155 mg/dL — ABNORMAL HIGH (ref 65–99)

## 2017-05-10 SURGERY — EGD (ESOPHAGOGASTRODUODENOSCOPY)
Anesthesia: Moderate Sedation

## 2017-05-10 MED ORDER — SODIUM CHLORIDE 0.9 % IV SOLN
INTRAVENOUS | Status: DC
  Administered 2017-05-10: 07:00:00 via INTRAVENOUS

## 2017-05-10 MED ORDER — LIDOCAINE VISCOUS 2 % MT SOLN
OROMUCOSAL | Status: AC
  Filled 2017-05-10: qty 15

## 2017-05-10 MED ORDER — MEPERIDINE HCL 100 MG/ML IJ SOLN
INTRAMUSCULAR | Status: AC
  Filled 2017-05-10: qty 2

## 2017-05-10 MED ORDER — STERILE WATER FOR IRRIGATION IR SOLN
Status: DC | PRN
  Administered 2017-05-10: 08:00:00

## 2017-05-10 MED ORDER — ONDANSETRON HCL 4 MG/2ML IJ SOLN
INTRAMUSCULAR | Status: DC | PRN
  Administered 2017-05-10: 4 mg via INTRAVENOUS

## 2017-05-10 MED ORDER — MEPERIDINE HCL 100 MG/ML IJ SOLN
INTRAMUSCULAR | Status: DC | PRN
  Administered 2017-05-10: 25 mg via INTRAVENOUS
  Administered 2017-05-10: 50 mg via INTRAVENOUS

## 2017-05-10 MED ORDER — MIDAZOLAM HCL 5 MG/5ML IJ SOLN
INTRAMUSCULAR | Status: AC
  Filled 2017-05-10: qty 10

## 2017-05-10 MED ORDER — MIDAZOLAM HCL 5 MG/5ML IJ SOLN
INTRAMUSCULAR | Status: DC | PRN
  Administered 2017-05-10: 2 mg via INTRAVENOUS
  Administered 2017-05-10: 1 mg via INTRAVENOUS

## 2017-05-10 MED ORDER — LIDOCAINE VISCOUS 2 % MT SOLN
OROMUCOSAL | Status: DC | PRN
  Administered 2017-05-10: 4 mL via OROMUCOSAL

## 2017-05-10 MED ORDER — ONDANSETRON HCL 4 MG/2ML IJ SOLN
INTRAMUSCULAR | Status: AC
  Filled 2017-05-10: qty 2

## 2017-05-10 NOTE — Op Note (Signed)
[facility] Patient Name: Monica [name] Procedure Date: 05/10/2017 7:11 AM MRN: [id number] Date of Birth: [birth year] Attending MD: Norvel Richards , MD CSN: [id number] Age: 76 Admit Type: Outpatient Procedure:                Upper GI endoscopy Indications:              Dysphagia Providers:                Norvel Richards, MD, Jeanann Lewandowsky. Gwenlyn Perking RN, RN,                            Randa Spike, Technician Referring MD:              Medicines:                Midazolam 3 mg IV, Meperidine 75 mg IV, Ondansetron                            4 mg IV Complications:            No immediate complications. Estimated Blood Loss:     Estimated blood loss was minimal. Procedure:                Pre-Anesthesia Assessment:                           - Prior to the procedure, a History and Physical                            was performed, and patient medications and                            allergies were reviewed. The patient's tolerance of                            previous anesthesia was also reviewed. The risks                            and benefits of the procedure and the sedation                            options and risks were discussed with the patient.                            All questions were answered, and informed consent                            was obtained. Prior Anticoagulants: The patient has                            taken no previous anticoagulant or antiplatelet                            agents. ASA Grade Assessment: II - A patient with  mild systemic disease. After reviewing the risks                            and benefits, the patient was deemed in                            satisfactory condition to undergo the procedure.                           After obtaining informed consent, the endoscope was                            passed under direct vision. Throughout the                            procedure, the patient's blood  pressure, pulse, and                            oxygen saturations were monitored continuously. The                            [id number] ([id number]) scope was introduced through the                            mouth, and advanced to the second part of duodenum.                            The upper GI endoscopy was accomplished without                            difficulty. The patient tolerated the procedure                            well. Scope In: 7:44:40 AM Scope Out: 7:51:50 AM Total Procedure Duration: 0 hours 7 minutes 10 seconds  Findings:      A moderate Schatzki ring (acquired) was found at the gastroesophageal       junction. The scope was withdrawn. Dilation was performed with a [place]       dilator with mild resistance at 16 Fr. The dilation site was examined       and showed moderate improvement in luminal narrowing. Estimated blood       loss was minimal.      A medium-sized hiatal hernia was present.      A few localized erosions were found in the gastric antrum.      The duodenal bulb and second portion of the duodenum were normal. Impression:               - Moderate Schatzki ring. Dilated.                           - Medium-sized hiatal hernia.                           - Erosive gastropathy. Biopsied. No sign of  residual or recurrent lymphoma.                           - Normal duodenal bulb and second portion of the                            duodenum. Biopsied. Moderate Sedation:      Moderate (conscious) sedation was administered by the endoscopy nurse       and supervised by the endoscopist. The following parameters were       monitored: oxygen saturation, heart rate, blood pressure, respiratory       rate, EKG, adequacy of pulmonary ventilation, and response to care.       Total physician intraservice time was 13 minutes. Recommendation:           - Repeat upper endoscopy (date not yet determined)                            for  surveillance.                           - Return to GI clinic in 3 months. Increase                            Protonix to 40 mg twice daily.                           - Advance diet as tolerated.                           - Continue present medications.                           - Patient has a contact number available for                            emergencies. The signs and symptoms of potential                            delayed complications were discussed with the                            patient. Return to normal activities tomorrow.                            Written discharge instructions were provided to the                            patient. Procedure Code(s):        --- Professional ---                           [phone], Esophagogastroduodenoscopy, flexible,                            transoral; with biopsy, single or multiple  67591, Dilation of esophagus, by unguided sound or                            bougie, single or multiple passes                           99152, Moderate sedation services provided by the                            same physician or other qualified health care                            professional performing the diagnostic or                            therapeutic service that the sedation supports,                            requiring the presence of an independent trained                            observer to assist in the monitoring of the                            patient's level of consciousness and physiological                            status; initial 15 minutes of intraservice time,                            patient age 41 years or older Diagnosis Code(s):        --- Professional ---                           K22.2, Esophageal obstruction                           K44.9, Diaphragmatic hernia without obstruction or                            gangrene                           K31.89, Other diseases of stomach and  duodenum                           R13.10, Dysphagia, unspecified CPT copyright 2016 American Medical Association. All rights reserved. The codes documented in this report are preliminary and upon coder review may  be revised to meet current compliance requirements. Cristopher Estimable. Narayan Scull, MD Norvel Richards, MD 05/10/2017 8:53:13 AM This report has been signed electronically. Number of Addenda: 0

## 2017-05-10 NOTE — Interval H&P Note (Signed)
History and Physical Interval Note:  05/10/2017 7:32 AM  Monica [name]  has presented today for surgery, with the diagnosis of gastric MALT lymphoma, esophageal dysphagia  The various methods of treatment have been discussed with the patient and family. After consideration of risks, benefits and other options for treatment, the patient has consented to  Procedure(s) with comments: ESOPHAGOGASTRODUODENOSCOPY (EGD) (N/A) - 7:30am MALONEY DILATION (N/A) as a surgical intervention .  The patient's history has been reviewed, patient examined, no change in status, stable for surgery.  I have reviewed the patient's chart and labs.  Questions were answered to the patient's satisfaction.     Khalel Alms  No change. EGD/ED as/appropriate per plan.   The risks, benefits, limitations, alternatives and imponderables have been reviewed with the patient. Potential for esophageal dilation, biopsy, etc. have also been reviewed.  Questions have been answered. All parties agreeable.

## 2017-05-10 NOTE — H&P (View-Only) (Signed)
Primary Care Physician:  [name]., MD  Primary Gastroenterologist:  Garfield Cornea, MD   Chief Complaint  Patient presents with  . Schatzki's ring    f/u, gets choked on first bite  . Dysphagia    HPI:  Monica [name] is a 76 y.o. female here for dysphagia. Patient has a history of stage I gastric MALT lymphoma. Diagnosed at time of EGD in April 2017. Treated with radiation alone from May to June 2017. Posttreatment EGD November 2017 revealed chronic gastritis but no evidence of lymphoid cells. H. pylori negative.  Patient has a long history of dysphagia requiring multiple esophageal dilations. Her last esophageal dilation was in June 2017 at which time she reports good response. She had a Schatzki ring and mucosal web noted at that time. With the first bite she feels like food sticks. Sometimes it comes back up but most of the time she can wash it down. No heartburn. A lot of belching. Still on pantoprazole. Also complains of a lot of gas. Flatulence and belching. Bowel movements regular. No melena rectal bleeding. Gas issues seem to occur after her radiation treatments.  Denies unintentional weight loss.  According to oncology notes, they would like [place] to see the patient every 6 months, alternating with their schedule so that the patient gets seen every 3 months by gastroenterology or oncology. We will see her back in September 2018 and then every 6 months afterwards.  Current Outpatient Prescriptions  Medication Sig Dispense Refill  . aspirin 81 MG tablet Take 81 mg by mouth daily.    . Calcium-Magnesium-Vitamin D (CALCIUM 500 PO) Take 500 mg by mouth daily.    . citalopram (CELEXA) 20 MG tablet Take 20 mg by mouth daily.     Marland Kitchen ESTRACE VAGINAL 0.1 MG/GM vaginal cream Place 1 Applicatorful vaginally 2 (two) times a week.     Marland Kitchen HYDROcodone-homatropine (HYCODAN) 5-1.5 MG/5ML syrup TAKE 5 MLS BY MOUTH AT BEDTIME AS NEEDED FOR SEVERE COUGH  0  . metFORMIN (GLUCOPHAGE-XR) 500 MG 24 hr  tablet Take 500 mg by mouth daily with breakfast.     . metoprolol tartrate (LOPRESSOR) 25 MG tablet Take 25 mg by mouth daily.     . pantoprazole (PROTONIX) 40 MG tablet Take 1 tablet (40 mg total) by mouth daily. 90 tablet 3  . pravastatin (PRAVACHOL) 40 MG tablet Take 40 mg by mouth at bedtime.      No current facility-administered medications for this visit.     Allergies as of 05/02/2017 - Review Complete 05/02/2017  Allergen Reaction Noted  . Sulfonamide derivatives Hives   . Sulfur  06/04/2016    Past Medical History:  Diagnosis Date  . Cancer (HCC)    NHL, Malt Lymphoma  . Depression   . DM (diabetes mellitus) ([place])   . Dysphagia   . Dysrhythmia    History of palpatations  . Heart palpitations   . Hiatal hernia   . Hyperlipidemia   . Interstitial cystitis   . 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 Sinusitis     Past Surgical History:  Procedure Laterality Date  . BIOPSY  09/01/2016   Procedure: BIOPSY;  Surgeon: Daneil Dolin, MD;  [facility];  Service: Endoscopy;;  gastric  . COLONOSCOPY     Dr. Lindalou Hose 2009: Normal per PCP notes  . ESOPHAGOGASTRODUODENOSCOPY     RMR: Prominant Schzgzki ring/component of peptic stricture status post dilation and disruption as  described above, otherwise norma esophagus, moderate-sized hiatal hernia, antal pyloric channel, and posterier bulbar erosions, otherwise unremarkable stomach, D1 and D2 . Inflammatory findings on the stomach and duodenum will likely be related to aspirin effect. We need to rule out Helicobacter pylor  . ESOPHAGOGASTRODUODENOSCOPY N/A 03/10/2015   Dr. 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 Pathology with lymphoid population of stomach, slight atypia  . ESOPHAGOGASTRODUODENOSCOPY N/A 02/03/2016   Dr. Gala Romney: Schatzki ring noted at GE junction, dilated with 64 and then 30 [place] dilator. Large hiatal  hernia. 6 x 7 cm nodular geographically ulcerated mucosa, biopsy c/w MALToma  . ESOPHAGOGASTRODUODENOSCOPY N/A 04/08/2016   Dr. Gala Romney: moderate Schatzki's ring/web s/p dilation, large hiatal hernia, localized area of gastric lymphoma visualized and appeared to be much improved. normal second portion of the duodenum. No specimens collected. Esophageal lumen notably tighted up significantly since her dilation in April of this year.   . ESOPHAGOGASTRODUODENOSCOPY N/A 08/04/2016   Procedure: ESOPHAGOGASTRODUODENOSCOPY (EGD);  Surgeon: Daneil Dolin, MD;  [facility];  Service: Endoscopy;  Laterality: N/A;  7:30 am  . ESOPHAGOGASTRODUODENOSCOPY N/A 09/01/2016   Procedure: ESOPHAGOGASTRODUODENOSCOPY (EGD);  Surgeon: Daneil Dolin, MD;  [facility];  Service: Endoscopy;  Laterality: N/A;  830   . MALONEY DILATION N/A 03/10/2015   Procedure: Venia Minks DILATION;  Surgeon: Daneil Dolin, MD;  [facility];  Service: Endoscopy;  Laterality: N/A;  . Venia Minks DILATION N/A 02/03/2016   Procedure: Venia Minks DILATION;  Surgeon: Daneil Dolin, MD;  [facility];  Service: Endoscopy;  Laterality: N/A;  . Venia Minks DILATION N/A 04/08/2016   Procedure: Venia Minks DILATION;  Surgeon: Daneil Dolin, MD;  [facility];  Service: Endoscopy;  Laterality: N/A;    Family History  Problem Relation Age of Onset  . Heart attack Father   . Dementia Father   . Diabetes Sister   . Diabetes Brother   . Diabetes Sister   . Colon cancer Neg Hx     Social History   Social History  . Marital status: Married    Spouse name: N/A  . Number of children: N/A  . Years of education: N/A   Occupational History  . Not on file.   Social History Main Topics  . Smoking status: Current Every Day Smoker    Packs/day: 0.75    Years: 57.00    Types: Cigarettes  . Smokeless tobacco: Never Used     Comment: trying to quit, wearing patch  . Alcohol use No  . Drug use: No  . Sexual  activity: Not on file   Other Topics Concern  . Not on file   Social History Narrative  . No narrative on file      ROS:  General: Negative for anorexia, weight loss, fever, chills, fatigue, weakness. Eyes: Negative for vision changes.  ENT: Negative for hoarseness,  nasal congestion.See history of present illness CV: Negative for chest pain, angina, palpitations, dyspnea on exertion, peripheral edema.  Respiratory: Negative for dyspnea at rest, dyspnea on exertion,  sputum, wheezing. Positive chronic cough GI: See history of present illness. GU:  Negative for dysuria, hematuria, urinary incontinence, urinary frequency, nocturnal urination.  MS: Negative for joint pain, low back pain.  Derm: Negative for rash or itching.  Neuro: Negative for weakness, abnormal sensation, seizure, frequent headaches, memory loss, confusion.  Psych: Negative for anxiety, depression, suicidal ideation, hallucinations.  Endo: Negative for  unusual weight change.  Heme: Negative for bruising or bleeding. Allergy: Negative for rash or hives.    Physical Examination:  BP 124/63   Pulse 72   Temp 97.6 F (36.4 C) (Oral)   Ht 5\' 6"  (1.676 m)   Wt 170 lb (77.1 kg)   BMI 27.44 kg/m    General: Well-nourished, well-developed in no acute distress.  Head: Normocephalic, atraumatic.   Eyes: Conjunctiva pink, no icterus. Mouth: Oropharyngeal mucosa moist and pink , no lesions erythema or exudate. Neck: Supple without thyromegaly, masses, or lymphadenopathy.  Lungs: Clear to auscultation bilaterally.  Heart: Regular rate and rhythm, no murmurs rubs or gallops.  Abdomen: Bowel sounds are normal, nontender, nondistended, no hepatosplenomegaly or masses, no abdominal bruits or    hernia , no rebound or guarding.   Rectal: Not performed Extremities: No lower extremity edema. No clubbing or deformities.  Neuro: Alert and oriented x 4 , grossly normal neurologically.  Skin: Warm and dry, no rash or  jaundice.   Psych: Alert and cooperative, normal mood and affect.  Labs: Lab Results  Component Value Date   CREATININE 0.82 01/07/2017   BUN 12 01/07/2017   NA 134 (L) 01/07/2017   K 4.1 01/07/2017   CL 101 01/07/2017   CO2 27 01/07/2017   Lab Results  Component Value Date   WBC 6.8 01/07/2017   HGB 12.6 01/07/2017   HCT 39.0 01/07/2017   MCV 87.8 01/07/2017   PLT 213 01/07/2017   Lab Results  Component Value Date   ALT 17 01/07/2017   AST 23 01/07/2017   ALKPHOS 69 01/07/2017   BILITOT 0.3 01/07/2017     Imaging Studies: No results found.

## 2017-05-10 NOTE — Discharge Instructions (Addendum)
EGD Discharge instructions Please read the instructions outlined below and refer to this sheet in the next few weeks. These discharge instructions provide you with general information on caring for yourself after you leave the hospital. Your doctor may also give you specific instructions. While your treatment has been planned according to the most current medical practices available, unavoidable complications occasionally occur. If you have any problems or questions after discharge, please call your doctor. ACTIVITY  You may resume your regular activity but move at a slower pace for the next 24 hours.   Take frequent rest periods for the next 24 hours.   Walking will help expel (get rid of) the air and reduce the bloated feeling in your abdomen.   No driving for 24 hours (because of the anesthesia (medicine) used during the test).   You may shower.   Do not sign any important legal documents or operate any machinery for 24 hours (because of the anesthesia used during the test).  NUTRITION  Drink plenty of fluids.   You may resume your normal diet.   Begin with a light meal and progress to your normal diet.   Avoid alcoholic beverages for 24 hours or as instructed by your caregiver.  MEDICATIONS  You may resume your normal medications unless your caregiver tells you otherwise.  WHAT YOU CAN EXPECT TODAY  You may experience abdominal discomfort such as a feeling of fullness or gas pains.  FOLLOW-UP  Your doctor will discuss the results of your test with you.  SEEK IMMEDIATE MEDICAL ATTENTION IF ANY OF THE FOLLOWING OCCUR:  Excessive nausea (feeling sick to your stomach) and/or vomiting.   Severe abdominal pain and distention (swelling).   Trouble swallowing.   Temperature over 101 F (37.8 C).   Rectal bleeding or vomiting of blood.     GERD information provided  Increase Protonix to 40 mg twice daily  Office visit with [place] in 3 months   Gastroesophageal Reflux  Disease, Adult Normally, food travels down the esophagus and stays in the stomach to be digested. However, when a person has gastroesophageal reflux disease (GERD), food and stomach acid move back up into the esophagus. When this happens, the esophagus becomes sore and inflamed. Over time, GERD can create small holes (ulcers) in the lining of the esophagus. What are the causes? This condition is caused by a problem with the muscle between the esophagus and the stomach (lower esophageal sphincter, or LES). Normally, the LES muscle closes after food passes through the esophagus to the stomach. When the LES is weakened or abnormal, it does not close properly, and that allows food and stomach acid to go back up into the esophagus. The LES can be weakened by certain dietary substances, medicines, and medical conditions, including:  Tobacco use.  Pregnancy.  Having a hiatal hernia.  Heavy alcohol use.  Certain foods and beverages, such as coffee, chocolate, onions, and peppermint.  What increases the risk? This condition is more likely to develop in:  People who have an increased body weight.  People who have connective tissue disorders.  People who use NSAID medicines.  What are the signs or symptoms? Symptoms of this condition include:  Heartburn.  Difficult or painful swallowing.  The feeling of having a lump in the throat.  Abitter taste in the mouth.  Bad breath.  Having a large amount of saliva.  Having an upset or bloated stomach.  Belching.  Chest pain.  Shortness of breath or wheezing.  Ongoing (  chronic) cough or a night-time cough.  Wearing away of tooth enamel.  Weight loss.  Different conditions can cause chest pain. Make sure to see your health care provider if you experience chest pain. How is this diagnosed? Your health care provider will take a medical history and perform a physical exam. To determine if you have mild or severe GERD, your health care  provider may also monitor how you respond to treatment. You may also have other tests, including:  An endoscopy toexamine your stomach and esophagus with a small camera.  A test thatmeasures the acidity level in your esophagus.  A test thatmeasures how much pressure is on your esophagus.  A barium swallow or modified barium swallow to show the shape, size, and functioning of your esophagus.  How is this treated? The goal of treatment is to help relieve your symptoms and to prevent complications. Treatment for this condition may vary depending on how severe your symptoms are. Your health care provider may recommend:  Changes to your diet.  Medicine.  Surgery.  Follow these instructions at home: Diet  Follow a diet as recommended by your health care provider. This may involve avoiding foods and drinks such as: ? Coffee and tea (with or without caffeine). ? Drinks that containalcohol. ? Energy drinks and sports drinks. ? Carbonated drinks or sodas. ? Chocolate and cocoa. ? Peppermint and mint flavorings. ? Garlic and onions. ? Horseradish. ? Spicy and acidic foods, including peppers, chili powder, curry powder, vinegar, hot sauces, and barbecue sauce. ? Citrus fruit juices and citrus fruits, such as oranges, lemons, and limes. ? Tomato-based foods, such as red sauce, chili, salsa, and pizza with red sauce. ? Fried and fatty foods, such as donuts, french fries, potato chips, and high-fat dressings. ? High-fat meats, such as hot dogs and fatty cuts of red and white meats, such as rib eye steak, sausage, ham, and bacon. ? High-fat dairy items, such as whole milk, butter, and cream cheese.  Eat small, frequent meals instead of large meals.  Avoid drinking large amounts of liquid with your meals.  Avoid eating meals during the 2-3 hours before bedtime.  Avoid lying down right after you eat.  Do not exercise right after you eat. General instructions  Pay attention to any  changes in your symptoms.  Take over-the-counter and prescription medicines only as told by your health care provider. Do not take aspirin, ibuprofen, or other NSAIDs unless your health care provider told you to do so.  Do not use any tobacco products, including cigarettes, chewing tobacco, and e-cigarettes. If you need help quitting, ask your health care provider.  Wear loose-fitting clothing. Do not wear anything tight around your waist that causes pressure on your abdomen.  Raise (elevate) the head of your bed 6 inches (15cm).  Try to reduce your stress, such as with yoga or meditation. If you need help reducing stress, ask your health care provider.  If you are overweight, reduce your weight to an amount that is healthy for you. Ask your health care provider for guidance about a safe weight loss goal.  Keep all follow-up visits as told by your health care provider. This is important. Contact a health care provider if:  You have new symptoms.  You have unexplained weight loss.  You have difficulty swallowing, or it hurts to swallow.  You have wheezing or a persistent cough.  Your symptoms do not improve with treatment.  You have a hoarse voice. Get help right  away if:  You have pain in your arms, neck, jaw, teeth, or back.  You feel sweaty, dizzy, or light-headed.  You have chest pain or shortness of breath.  You vomit and your vomit looks like blood or coffee grounds.  You faint.  Your stool is bloody or black.  You cannot swallow, drink, or eat. This information is not intended to replace advice given to you by your health care provider. Make sure you discuss any questions you have with your health care provider. Document Released: 07/28/2005 Document Revised: 03/17/2016 Document Reviewed: 02/12/2015 Elsevier Interactive Patient Education  2017 [employer].

## 2017-05-11 DIAGNOSIS — J410 Simple chronic bronchitis: Secondary | ICD-10-CM | POA: Diagnosis not present

## 2017-05-11 DIAGNOSIS — Z6826 Body mass index (BMI) 26.0-26.9, adult: Secondary | ICD-10-CM | POA: Diagnosis not present

## 2017-05-11 DIAGNOSIS — K219 Gastro-esophageal reflux disease without esophagitis: Secondary | ICD-10-CM | POA: Diagnosis not present

## 2017-05-11 DIAGNOSIS — F329 Major depressive disorder, single episode, unspecified: Secondary | ICD-10-CM | POA: Diagnosis not present

## 2017-05-11 DIAGNOSIS — C884 Extranodal marginal zone B-cell lymphoma of mucosa-associated lymphoid tissue [MALT-lymphoma]: Secondary | ICD-10-CM | POA: Diagnosis not present

## 2017-05-11 DIAGNOSIS — N3020 Other chronic cystitis without hematuria: Secondary | ICD-10-CM | POA: Diagnosis not present

## 2017-05-11 DIAGNOSIS — E119 Type 2 diabetes mellitus without complications: Secondary | ICD-10-CM | POA: Diagnosis not present

## 2017-05-11 DIAGNOSIS — Z0000 Encounter for general adult medical examination without abnormal findings: Secondary | ICD-10-CM | POA: Diagnosis not present

## 2017-05-11 DIAGNOSIS — E7800 Pure hypercholesterolemia, unspecified: Secondary | ICD-10-CM | POA: Diagnosis not present

## 2017-05-12 ENCOUNTER — Other Ambulatory Visit: Payer: Self-pay

## 2017-05-12 MED ORDER — PANTOPRAZOLE SODIUM 40 MG PO TBEC: 40.0000 mg | DELAYED_RELEASE_TABLET | Freq: Every day | ORAL | 3 refills | Status: DC

## 2017-05-12 NOTE — Telephone Encounter (Signed)
rx done

## 2017-05-12 NOTE — Telephone Encounter (Signed)
Pt would like to have a 90 day supply sent into the pharmacy

## 2017-05-13 ENCOUNTER — Encounter (HOSPITAL_COMMUNITY): Payer: Self-pay | Admitting: Internal Medicine

## 2017-05-16 DIAGNOSIS — N3020 Other chronic cystitis without hematuria: Secondary | ICD-10-CM | POA: Diagnosis not present

## 2017-08-10 ENCOUNTER — Ambulatory Visit: Payer: Medicare Other | Admitting: Gastroenterology

## 2017-08-15 DIAGNOSIS — N952 Postmenopausal atrophic vaginitis: Secondary | ICD-10-CM | POA: Diagnosis not present

## 2017-08-15 DIAGNOSIS — N3020 Other chronic cystitis without hematuria: Secondary | ICD-10-CM | POA: Diagnosis not present

## 2017-08-17 DIAGNOSIS — Z23 Encounter for immunization: Secondary | ICD-10-CM | POA: Diagnosis not present

## 2017-08-18 DIAGNOSIS — Z6826 Body mass index (BMI) 26.0-26.9, adult: Secondary | ICD-10-CM | POA: Diagnosis not present

## 2017-08-18 DIAGNOSIS — E782 Mixed hyperlipidemia: Secondary | ICD-10-CM | POA: Diagnosis not present

## 2017-08-18 DIAGNOSIS — C884 Extranodal marginal zone B-cell lymphoma of mucosa-associated lymphoid tissue [MALT-lymphoma]: Secondary | ICD-10-CM | POA: Diagnosis not present

## 2017-08-18 DIAGNOSIS — F411 Generalized anxiety disorder: Secondary | ICD-10-CM | POA: Diagnosis not present

## 2017-08-18 DIAGNOSIS — I10 Essential (primary) hypertension: Secondary | ICD-10-CM | POA: Diagnosis not present

## 2017-08-18 DIAGNOSIS — E1165 Type 2 diabetes mellitus with hyperglycemia: Secondary | ICD-10-CM | POA: Diagnosis not present

## 2017-09-06 DIAGNOSIS — R300 Dysuria: Secondary | ICD-10-CM | POA: Diagnosis not present

## 2017-09-06 DIAGNOSIS — E119 Type 2 diabetes mellitus without complications: Secondary | ICD-10-CM | POA: Diagnosis not present

## 2017-09-16 ENCOUNTER — Ambulatory Visit (INDEPENDENT_AMBULATORY_CARE_PROVIDER_SITE_OTHER): Payer: Medicare Other | Admitting: Gastroenterology

## 2017-09-16 ENCOUNTER — Encounter: Payer: Self-pay | Admitting: Gastroenterology

## 2017-09-16 VITALS — BP 122/58 | HR 85 | Temp 97.0°F | Ht 66.0 in | Wt 170.4 lb

## 2017-09-16 DIAGNOSIS — C884 Extranodal marginal zone B-cell lymphoma of mucosa-associated lymphoid tissue [MALT-lymphoma]: Secondary | ICD-10-CM | POA: Diagnosis not present

## 2017-09-16 DIAGNOSIS — R1030 Lower abdominal pain, unspecified: Secondary | ICD-10-CM | POA: Diagnosis not present

## 2017-09-16 DIAGNOSIS — R140 Abdominal distension (gaseous): Secondary | ICD-10-CM

## 2017-09-16 NOTE — Progress Notes (Signed)
cc'ed to pcp °

## 2017-09-16 NOTE — Assessment & Plan Note (Addendum)
Possibly related to UTI, modest improvement on antibiotics.  She will complete antibiotics.  If persistent symptoms may consider CT scan versus empirically treating for diverticulitis.  Patient will call me Monday with a progress report.  Have requested discharge summary from 2014 to try to determine which antibiotic she did not tolerate so that we can add it to her allergy list.  She believes it started with an a.m.  Suspect metronidazole.

## 2017-09-16 NOTE — Progress Notes (Signed)
Primary Care Physician: Manon Hilding, MD  Primary Gastroenterologist:  Garfield Cornea, MD   Chief Complaint  Patient presents with  . MALToma    pp f/u-doing ok    HPI: Monica [name] is a 76 y.o. female here for follow-up.  She has a history of stage I gastric MALT lymphoma.  Diagnosed at time of EGD in April 2017.  Treated with radiation alone from May - June 2017.  Posttreatment EGD November 2017 revealed chronic gastritis but no evidence of lymphoid cells.  H. pylori negative.  She had another EGD in July for dysphagia.  Schatzki ring dilated.  Moderate to large hiatal hernia noted.  No evidence of recurrent tumor.  Few localized erosions in the stomach but no specimens collected.  Esophageal dilation helped her dysphagia.  Swallowing well.  Heartburn well controlled.  Denies constipation, diarrhea, melena, rectal bleeding.  She has had some lower abdominal discomfort for about 2 weeks.  She saw her PCP who diagnosed her with a UTI and started her on nitrofurantoin.  She is taken 3 days of antibiotics, has 2 days left.  25% improvement in her abdominal pain..  She is concerned that her lower abdominal pain may be unrelated to UTI.  States it feels like when she had diverticulitis back in 2014, hospitalized for nearly a week.  She states she was allergic to an antibiotic that started with a "M".   Belching/gas.  States it continues to be excessive.  Excessive flatulence.  She is on a probiotic daily which does not help.  Reports limited dairy products although she has some milk with her cereal, cream in her coffee.  Occasional ice cream.   Current Outpatient Medications  Medication Sig Dispense Refill  . Artificial Tear Solution (SOOTHE XP) SOLN Place 2 drops into both eyes daily as needed (dry eyes).    Marland Kitchen aspirin 81 MG tablet Take 81 mg by mouth at bedtime.     . Calcium-Magnesium-Vitamin D (CALCIUM 500 PO) Take 500 mg by mouth at bedtime.     . citalopram (CELEXA) 20 MG tablet  Take 20 mg by mouth daily.     Marland Kitchen ESTRACE VAGINAL 0.1 MG/GM vaginal cream Place 1 Applicatorful vaginally 2 (two) times a week. Monday and Friday    . metFORMIN (GLUCOPHAGE-XR) 500 MG 24 hr tablet Take 500 mg 2 (two) times daily by mouth.     . metoprolol tartrate (LOPRESSOR) 25 MG tablet Take 25 mg by mouth at bedtime.     . pantoprazole (PROTONIX) 40 MG tablet Take 1 tablet (40 mg total) by mouth daily before breakfast. 90 tablet 3  . pravastatin (PRAVACHOL) 40 MG tablet Take 40 mg by mouth at bedtime.      No current facility-administered medications for this visit.     Allergies as of 09/16/2017 - Review Complete 09/16/2017  Allergen Reaction Noted  . Sulfonamide derivatives Hives   . Sulfur  06/04/2016    ROS:  General: Negative for anorexia, weight loss, fever, chills, fatigue, weakness. ENT: Negative for hoarseness, difficulty swallowing , nasal congestion. CV: Negative for chest pain, angina, palpitations, dyspnea on exertion, peripheral edema.  Respiratory: Negative for dyspnea at rest, dyspnea on exertion, cough, sputum, wheezing.  GI: See history of present illness. GU:  Negative for dysuria, hematuria, urinary incontinence, urinary frequency, nocturnal urination.  Endo: Negative for unusual weight change.    Physical Examination:   BP (!) 122/58   Pulse 85   Temp (!)  97 F (36.1 C) (Oral)   Ht 5\' 6"  (1.676 m)   Wt 170 lb 6.4 oz (77.3 kg)   BMI 27.50 kg/m   General: Well-nourished, well-developed in no acute distress.  Eyes: No icterus. Mouth: Oropharyngeal mucosa moist and pink , no lesions erythema or exudate. Lungs: Clear to auscultation bilaterally.  Heart: Regular rate and rhythm, no murmurs rubs or gallops.  Abdomen: Bowel sounds are normal, mild to moderate lower abdominal tenderness, nondistended, no hepatosplenomegaly or masses, no abdominal bruits or hernia , no rebound or guarding.   Extremities: No lower extremity edema. No clubbing or  deformities. Neuro: Alert and oriented x 4   Skin: Warm and dry, no jaundice.   Psych: Alert and cooperative, normal mood and affect.  Labs:  Lab Results  Component Value Date   CREATININE 0.82 01/07/2017   BUN 12 01/07/2017   NA 134 (L) 01/07/2017   K 4.1 01/07/2017   CL 101 01/07/2017   CO2 27 01/07/2017   Lab Results  Component Value Date   ALT 17 01/07/2017   AST 23 01/07/2017   ALKPHOS 69 01/07/2017   BILITOT 0.3 01/07/2017   Lab Results  Component Value Date   WBC 6.8 01/07/2017   HGB 12.6 01/07/2017   HCT 39.0 01/07/2017   MCV 87.8 01/07/2017   PLT 213 01/07/2017    Imaging Studies: No results found.

## 2017-09-16 NOTE — Assessment & Plan Note (Addendum)
Complains of excessive belching, flatulence.  No diarrhea.  Has been going on for over 6 months at least.  She does not believe she is getting very much dairy.  Does not really want to try Lactaid.  Begin digestive advantage intensive bowel support.  Avoid high FODMAP foods.  If no significant improvement, consider small bowel bacterial overgrowth as a possibility.

## 2017-09-16 NOTE — Patient Instructions (Signed)
1. Avoid foods from the [place] list (see handout) as they can produce more gas in your digestive system.  2. Try Digestive Advantage - Intensive Bowel Support. You can buy over the counter. We did not have any samples. This may help with your excessive gas.  3. Call Monday and let me know how your abdominal pain is. If persistent, then we will move towards a CT scan.

## 2017-09-16 NOTE — Assessment & Plan Note (Addendum)
Clinically doing well. No evidence of recurrence on EGDs, with last EGD in 05/2017. H.pylori negative. Based on guidelines, she will likely need her next EGD at the two year mark from diagnosis, 01/2018 but will confirm with oncology, she is schedule for follow up with them within the next few week.  Will see her back in the office in 12/2017.

## 2017-09-19 ENCOUNTER — Telehealth: Payer: Self-pay | Admitting: Internal Medicine

## 2017-09-19 ENCOUNTER — Other Ambulatory Visit: Payer: Self-pay

## 2017-09-19 ENCOUNTER — Ambulatory Visit (HOSPITAL_COMMUNITY)
Admission: RE | Admit: 2017-09-19 | Discharge: 2017-09-19 | Disposition: A | Discharge status: Home / Self Care | Payer: Medicare Other | Source: Ambulatory Visit | Attending: Gastroenterology | Admitting: Gastroenterology

## 2017-09-19 DIAGNOSIS — N281 Cyst of kidney, acquired: Secondary | ICD-10-CM | POA: Diagnosis not present

## 2017-09-19 DIAGNOSIS — K760 Fatty (change of) liver, not elsewhere classified: Secondary | ICD-10-CM | POA: Insufficient documentation

## 2017-09-19 DIAGNOSIS — R1032 Left lower quadrant pain: Secondary | ICD-10-CM | POA: Diagnosis not present

## 2017-09-19 DIAGNOSIS — I700 Atherosclerosis of aorta: Secondary | ICD-10-CM | POA: Diagnosis not present

## 2017-09-19 DIAGNOSIS — R918 Other nonspecific abnormal finding of lung field: Secondary | ICD-10-CM | POA: Insufficient documentation

## 2017-09-19 DIAGNOSIS — K5730 Diverticulosis of large intestine without perforation or abscess without bleeding: Secondary | ICD-10-CM | POA: Insufficient documentation

## 2017-09-19 DIAGNOSIS — Z8572 Personal history of non-Hodgkin lymphomas: Secondary | ICD-10-CM | POA: Insufficient documentation

## 2017-09-19 DIAGNOSIS — K449 Diaphragmatic hernia without obstruction or gangrene: Secondary | ICD-10-CM | POA: Insufficient documentation

## 2017-09-19 DIAGNOSIS — Z90710 Acquired absence of both cervix and uterus: Secondary | ICD-10-CM | POA: Insufficient documentation

## 2017-09-19 LAB — POCT I-STAT CREATININE: CREATININE: 0.7 mg/dL (ref 0.44–1.00)

## 2017-09-19 MED ORDER — IOPAMIDOL (ISOVUE-300) INJECTION 61%
100.0000 mL | Freq: Once | INTRAVENOUS | Status: AC | PRN
  Administered 2017-09-19: 100 mL via INTRAVENOUS

## 2017-09-19 MED ORDER — IOPAMIDOL (ISOVUE-300) INJECTION 61%
INTRAVENOUS | Status: AC
  Filled 2017-09-19: qty 30

## 2017-09-19 NOTE — Telephone Encounter (Signed)
Spoke with pt. Pt continues to have excessive gas, pain in lower abdomen, lower back pain, nausea, loss of appetite.Pt also feels a pulling sensation in her hips. Pt is afraid that she is having a diverticulitis flare. Pt stated that she knows how that feels since she was hospitalized a few years back with a diverticulitis flare. Pt finished her last antibiotic for a UTI this weekend.   Pt started the Digestive Advantage this weekend as directed, please advise.

## 2017-09-19 NOTE — Telephone Encounter (Signed)
Called pt. She lasted had something to eat about 8:00am this morning. Took Metformin at 7:30am today.   Called [employer]. Pt can go to [facility] now for CT abd/pelvis.  Pt is aware to go now to [facility] dept for CT abd/pelvis. She is aware to be NPO.

## 2017-09-19 NOTE — Telephone Encounter (Signed)
As discussed with patient on Friday, let's pursue CT A/P with contrast for persistent lower abdominal pain/LLQ pain. Rule out diverticulitis. Can we get it done today or tomorrow please?

## 2017-09-19 NOTE — Telephone Encounter (Signed)
Pt called this morning to let us know she was seen by [place] on Friday and was told to call back today if she was still hurting. She wanted to let [facility] know that she is still hurting. Please advise and call her at [phone]

## 2017-09-20 ENCOUNTER — Encounter: Payer: Self-pay | Admitting: Internal Medicine

## 2017-09-20 ENCOUNTER — Telehealth: Payer: Self-pay | Admitting: Internal Medicine

## 2017-09-20 NOTE — Progress Notes (Signed)
Let patient know that CT scan shows no evidence of active diverticulitis.  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  Nothing to explain her abdominal pain.  I would like for her to submit another urine specimen for urinalysis and for urine culture to make sure her UTI was adequately treated.   Continue plan as outlined day of office visit. Have her return to the office in 2-3 weeks, may use urgent.

## 2017-09-20 NOTE — Telephone Encounter (Signed)
Patient had a STAT CT scan yesterday. See result note.

## 2017-09-20 NOTE — Telephone Encounter (Signed)
lmom for a returrn call.

## 2017-09-20 NOTE — Telephone Encounter (Signed)
Pt is calling back about her results, Left a voicemail on pts cell letting her know that it can take 7-10 business days.

## 2017-09-20 NOTE — Telephone Encounter (Signed)
Pt was calling to get her CT results and asked for LSL to call her on her cell [phone]

## 2017-09-20 NOTE — Telephone Encounter (Signed)
noted 

## 2017-09-26 DIAGNOSIS — N3020 Other chronic cystitis without hematuria: Secondary | ICD-10-CM | POA: Diagnosis not present

## 2017-09-26 DIAGNOSIS — B373 Candidiasis of vulva and vagina: Secondary | ICD-10-CM | POA: Diagnosis not present

## 2017-09-29 NOTE — Telephone Encounter (Signed)
Closing note out. Pt has a f/u with LSL and was given results. Pt is aware of the urine collection that is needed. See prior phone note.

## 2017-10-05 ENCOUNTER — Other Ambulatory Visit (HOSPITAL_COMMUNITY): Payer: Self-pay | Admitting: *Deleted

## 2017-10-05 DIAGNOSIS — C884 Extranodal marginal zone B-cell lymphoma of mucosa-associated lymphoid tissue [MALT-lymphoma]: Secondary | ICD-10-CM

## 2017-10-06 ENCOUNTER — Encounter (HOSPITAL_BASED_OUTPATIENT_CLINIC_OR_DEPARTMENT_OTHER): Payer: Medicare Other | Admitting: Oncology

## 2017-10-06 ENCOUNTER — Encounter (HOSPITAL_COMMUNITY): Payer: Self-pay | Admitting: Oncology

## 2017-10-06 ENCOUNTER — Other Ambulatory Visit: Payer: Self-pay

## 2017-10-06 ENCOUNTER — Encounter (HOSPITAL_COMMUNITY): Payer: Medicare Other | Attending: Oncology

## 2017-10-06 VITALS — BP 129/59 | HR 87 | Temp 97.8°F | Resp 18 | Wt 170.3 lb

## 2017-10-06 DIAGNOSIS — Z720 Tobacco use: Secondary | ICD-10-CM

## 2017-10-06 DIAGNOSIS — Z7982 Long term (current) use of aspirin: Secondary | ICD-10-CM | POA: Diagnosis not present

## 2017-10-06 DIAGNOSIS — Z7984 Long term (current) use of oral hypoglycemic drugs: Secondary | ICD-10-CM | POA: Diagnosis not present

## 2017-10-06 DIAGNOSIS — Z79899 Other long term (current) drug therapy: Secondary | ICD-10-CM | POA: Diagnosis not present

## 2017-10-06 DIAGNOSIS — F17210 Nicotine dependence, cigarettes, uncomplicated: Secondary | ICD-10-CM | POA: Diagnosis not present

## 2017-10-06 DIAGNOSIS — J439 Emphysema, unspecified: Secondary | ICD-10-CM | POA: Insufficient documentation

## 2017-10-06 DIAGNOSIS — R911 Solitary pulmonary nodule: Secondary | ICD-10-CM

## 2017-10-06 DIAGNOSIS — I700 Atherosclerosis of aorta: Secondary | ICD-10-CM | POA: Diagnosis not present

## 2017-10-06 DIAGNOSIS — I2510 Atherosclerotic heart disease of native coronary artery without angina pectoris: Secondary | ICD-10-CM | POA: Diagnosis not present

## 2017-10-06 DIAGNOSIS — C884 Extranodal marginal zone B-cell lymphoma of mucosa-associated lymphoid tissue [MALT-lymphoma]: Secondary | ICD-10-CM

## 2017-10-06 DIAGNOSIS — E119 Type 2 diabetes mellitus without complications: Secondary | ICD-10-CM | POA: Insufficient documentation

## 2017-10-06 DIAGNOSIS — E785 Hyperlipidemia, unspecified: Secondary | ICD-10-CM | POA: Diagnosis not present

## 2017-10-06 DIAGNOSIS — Z8572 Personal history of non-Hodgkin lymphomas: Secondary | ICD-10-CM

## 2017-10-06 DIAGNOSIS — K449 Diaphragmatic hernia without obstruction or gangrene: Secondary | ICD-10-CM | POA: Diagnosis not present

## 2017-10-06 DIAGNOSIS — F329 Major depressive disorder, single episode, unspecified: Secondary | ICD-10-CM | POA: Insufficient documentation

## 2017-10-06 LAB — CBC WITH DIFFERENTIAL/PLATELET
BASOS ABS: 0 10*3/uL (ref 0.0–0.1)
BASOS PCT: 1 %
Eosinophils Absolute: 0.1 10*3/uL (ref 0.0–0.7)
Eosinophils Relative: 1 %
HEMATOCRIT: 39.5 % (ref 36.0–46.0)
HEMOGLOBIN: 12.6 g/dL (ref 12.0–15.0)
Lymphocytes Relative: 32 %
Lymphs Abs: 2.1 10*3/uL (ref 0.7–4.0)
MCH: 28.6 pg (ref 26.0–34.0)
MCHC: 31.9 g/dL (ref 30.0–36.0)
MCV: 89.8 fL (ref 78.0–100.0)
Monocytes Absolute: 0.5 10*3/uL (ref 0.1–1.0)
Monocytes Relative: 8 %
NEUTROS ABS: 3.7 10*3/uL (ref 1.7–7.7)
NEUTROS PCT: 58 %
Platelets: 255 10*3/uL (ref 150–400)
RBC: 4.4 MIL/uL (ref 3.87–5.11)
RDW: 15.3 % (ref 11.5–15.5)
WBC: 6.4 10*3/uL (ref 4.0–10.5)

## 2017-10-06 LAB — COMPREHENSIVE METABOLIC PANEL
ALBUMIN: 4.1 g/dL (ref 3.5–5.0)
ALK PHOS: 62 U/L (ref 38–126)
ALT: 18 U/L (ref 14–54)
AST: 30 U/L (ref 15–41)
Anion gap: 10 (ref 5–15)
BILIRUBIN TOTAL: 0.7 mg/dL (ref 0.3–1.2)
BUN: 14 mg/dL (ref 6–20)
CO2: 26 mmol/L (ref 22–32)
Calcium: 9.4 mg/dL (ref 8.9–10.3)
Chloride: 100 mmol/L — ABNORMAL LOW (ref 101–111)
Creatinine, Ser: 0.89 mg/dL (ref 0.44–1.00)
GFR calc Af Amer: >60 mL/min (ref >60)
GFR calc non Af Amer: >60 mL/min (ref >60)
GLUCOSE: 152 mg/dL — AB (ref 65–99)
POTASSIUM: 4.6 mmol/L (ref 3.5–5.1)
Sodium: 136 mmol/L (ref 135–145)
TOTAL PROTEIN: 7.6 g/dL (ref 6.5–8.1)

## 2017-10-06 LAB — LACTATE DEHYDROGENASE: LDH: 135 U/L (ref 98–192)

## 2017-10-06 NOTE — Patient Instructions (Signed)
[facility] at [facility] Discharge Instructions  RECOMMENDATIONS MADE BY THE CONSULTANT AND ANY TEST RESULTS WILL BE SENT TO YOUR REFERRING PHYSICIAN.  You were seen today by Dr. Louise Zhou Follow up in 6 months with labs   Thank you for choosing [facility] at [facility] to provide your oncology and hematology care.  To afford each patient quality time with our provider, please arrive at least 15 minutes before your scheduled appointment time.    If you have a lab appointment with the Cancer Center please come in thru the  Main Entrance and check in at the main information desk  You need to re-schedule your appointment should you arrive 10 or more minutes late.  We strive to give you quality time with our providers, and arriving late affects you and other patients whose appointments are after yours.  Also, if you no show three or more times for appointments you may be dismissed from the clinic at the providers discretion.     Again, thank you for choosing [facility].  Our hope is that these requests will decrease the amount of time that you wait before being seen by our physicians.       _____________________________________________________________  Should you have questions after your visit to [facility], please contact our office at [phone] between the hours of 8:30 a.m. and 4:30 p.m.  Voicemails left after 4:30 p.m. will not be returned until the following business day.  For prescription refill requests, have your pharmacy contact our office.       Resources For Cancer Patients and their Caregivers ? American Cancer Society: Can assist with transportation, wigs, general needs, runs Look Good Feel Better.        1-888-227-6333 ? Cancer Care: Provides financial assistance, online support groups, medication/co-pay assistance.  1-800-813-HOPE (4673) ? [facility] Assists  [place] cancer patients and their families through emotional , educational and financial support.  336-427-4357 ? [place] DSS Where to apply for food stamps, Medicaid and utility assistance. 336-342-1394 ? [facility]: Transportation to medical appointments. 336-347-2287 ? Social Security Administration: May apply for disability if have a Stage IV cancer. 336-342-7796 1-800-772-1213 ? [place] Aging, Disability and Transit Services: Assists with nutrition, care and transit needs. 336-349-2343  Cancer Center Support Programs: @[email or web address]@ > Cancer Support Group  2nd Tuesday of the month 1pm-2pm, Journey Room  > Creative Journey  3rd Tuesday of the month 1130am-1pm, Journey Room  > Look Good Feel Better  1st Wednesday of the month 10am-12 noon, Journey Room (Call American Cancer Society to register 1-800-395-5775)    

## 2017-10-06 NOTE — Progress Notes (Signed)
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 Gala Romney, abnormal gastric mucosa. Pathology with EXTRANODAL Marginal zone lymphoma. NEGATIVE for H. Pylori      03/03/2016 Miscellaneous    H pylori stool antigen NEGATIVE      03/03/2016 PET scan    Focal area of hypermetabolism and wall thickening involving the body antral junction region of the stomach c/w history of lymphoma. 5.5 mm LLL pulm nodule not hypermetabolic. Non contrast chest CT in 6 month      03/18/2016 - 04/08/2016 Radiation Therapy    The gastric tumor received 30 Gy in 15 fractions of 2 Gy        HISTORY OF PRESENTING ILLNESS:  Monica [name] 76 y.o. 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  Patient presents today for continue follow-up.  She had a repeat upper endoscopy performed on 05/10/2017 which demonstrated no sign of residual or recurrent lymphoma.  CT abdomen pelvis with contrast on 09/19/2017 demonstrated no stomach mass or wall thickening to suggest recurrent gastric lymphoma.  Repeat CT chest without contrast to monitor her pulmonary nodules on 03/02/2017 demonstrated previously noted small left-sided pulmonary nodules are stable compared to prior examinations, considered benign at this time, presumably subpleural lymph nodes. No larger more suspicious appearing pulmonary nodules or masses are noted.  Today patient states she is doing well.  She had a recent UTI a yeast infection for which she was treated with antibiotics.  She states her appetite and energy level are stable at 75%.  She denies any chest pain, shortness of breath, nausea, vomiting, diarrhea, focal weakness. Patient  states she has some lower abdominal pain which had been going on in November and had the CT abd/pelvis performed but no acute process was found, she states her abdominal pain has been improving.  MEDICAL HISTORY:  Past Medical History:  Diagnosis Date  . Cancer (HCC)    NHL, Malt Lymphoma  . Depression   . DM (diabetes mellitus) ([place])   . Dysphagia   . Dysrhythmia    History of palpatations  . Heart palpitations   . Hiatal hernia   . Hyperlipidemia   . Interstitial cystitis   . 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 Sinusitis     SURGICAL HISTORY: Past Surgical History:  Procedure Laterality Date  . BIOPSY  09/01/2016   Procedure: BIOPSY;  Surgeon: Daneil Dolin, MD;  [facility];  Service: Endoscopy;;  gastric  . COLONOSCOPY     Dr. Lindalou Hose 2009: Normal per PCP notes  . ESOPHAGOGASTRODUODENOSCOPY     RMR: Prominant Schzgzki ring/component of peptic stricture status post dilation and disruption as described above, otherwise norma esophagus, moderate-sized hiatal hernia, antal pyloric channel, and posterier bulbar erosions, otherwise unremarkable stomach, D1 and D2 . Inflammatory findings on the stomach and duodenum will likely be related to aspirin effect. We need to rule out Helicobacter pylor  . ESOPHAGOGASTRODUODENOSCOPY N/A 03/10/2015   Dr. 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 Pathology with lymphoid population of stomach, slight atypia  . ESOPHAGOGASTRODUODENOSCOPY N/A  02/03/2016   Dr. Gala Romney: Schatzki ring noted at GE junction, dilated with 102 and then 47 French Maloney dilator. Large hiatal hernia. 6 x 7 cm nodular geographically ulcerated mucosa, biopsy c/w MALToma  . ESOPHAGOGASTRODUODENOSCOPY N/A 04/08/2016   Dr. Gala Romney: moderate Schatzki's ring/web s/p dilation, large hiatal hernia, localized area of gastric lymphoma visualized and appeared to be much improved. normal  second portion of the duodenum. No specimens collected. Esophageal lumen notably tighted up significantly since her dilation in April of this year.   . ESOPHAGOGASTRODUODENOSCOPY N/A 08/04/2016   Procedure: ESOPHAGOGASTRODUODENOSCOPY (EGD);  Surgeon: Daneil Dolin, MD;  [facility];  Service: Endoscopy;  Laterality: N/A;  7:30 am  . ESOPHAGOGASTRODUODENOSCOPY N/A 09/01/2016   Procedure: ESOPHAGOGASTRODUODENOSCOPY (EGD);  Surgeon: Daneil Dolin, MD;  [facility];  Service: Endoscopy;  Laterality: N/A;  830   . ESOPHAGOGASTRODUODENOSCOPY N/A 05/10/2017   Procedure: ESOPHAGOGASTRODUODENOSCOPY (EGD);  Surgeon: Daneil Dolin, MD;  [facility];  Service: Endoscopy;  Laterality: N/A;  7:30am  . MALONEY DILATION N/A 03/10/2015   Procedure: Venia Minks DILATION;  Surgeon: Daneil Dolin, MD;  [facility];  Service: Endoscopy;  Laterality: N/A;  . Venia Minks DILATION N/A 02/03/2016   Procedure: Venia Minks DILATION;  Surgeon: Daneil Dolin, MD;  [facility];  Service: Endoscopy;  Laterality: N/A;  . Venia Minks DILATION N/A 04/08/2016   Procedure: Venia Minks DILATION;  Surgeon: Daneil Dolin, MD;  [facility];  Service: Endoscopy;  Laterality: N/A;  . Venia Minks DILATION N/A 05/10/2017   Procedure: Venia Minks DILATION;  Surgeon: Daneil Dolin, MD;  [facility];  Service: Endoscopy;  Laterality: N/A;    SOCIAL HISTORY: Social History   Socioeconomic History  . Marital status: Married    Spouse name: Not on file  . Number of children: Not on file  . Years of education: Not on file  . Highest education level: Not on file  Social Needs  . Financial resource strain: Not on file  . Food insecurity - worry: Not on file  . Food insecurity - inability: Not on file  . Transportation needs - medical: Not on file  . Transportation needs - non-medical: Not on file  Occupational History  . Not on file  Tobacco Use  . Smoking status: Current Every  Day Smoker    Packs/day: 0.75    Years: 57.00    Pack years: 42.75    Types: Cigarettes  . Smokeless tobacco: Never Used  . Tobacco comment: trying to quit, wearing patch  Substance and Sexual Activity  . Alcohol use: No    Alcohol/week: 0.0 oz  . Drug use: No  . Sexual activity: Not on file  Other Topics Concern  . Not on file  Social History Narrative  . Not on file  Married; 36 years 4 children 5 grandsons; 1 great grandchild Smokes currently; Almost a pack a day; started at 76 years old. 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 Heart attack Father   . Dementia Father   . Diabetes Sister   . Diabetes Brother   . Diabetes Sister   . Colon cancer Neg Hx   Mother-is living at 59 years old Father-died of heart attack; early 70's 1 brother 3 sisters 1 sister bad type 1 diabetic and died 54 years ago 1 son died 105 years ago of cancer; had a  tumor removed from his spine 1 son committed suicide in the 48's  ALLERGIES:  is allergic to sulfonamide derivatives and sulfur.  MEDICATIONS:  Current Outpatient Medications  Medication Sig Dispense Refill  . Artificial Tear Solution (SOOTHE XP) SOLN Place 2 drops into both eyes daily as needed (dry eyes).    Marland Kitchen aspirin 81 MG tablet Take 81 mg by mouth at bedtime.     . Calcium-Magnesium-Vitamin D (CALCIUM 500 PO) Take 500 mg by mouth at bedtime.     . citalopram (CELEXA) 20 MG tablet Take 20 mg by mouth daily.     Marland Kitchen ESTRACE VAGINAL 0.1 MG/GM vaginal cream Place 1 Applicatorful vaginally 2 (two) times a week. Monday and Friday    . metFORMIN (GLUCOPHAGE-XR) 500 MG 24 hr tablet Take 500 mg 2 (two) times daily by mouth.     . metoprolol tartrate (LOPRESSOR) 25 MG tablet Take 25 mg by mouth at bedtime.     . pantoprazole (PROTONIX) 40 MG tablet Take 1 tablet (40 mg total) by mouth daily before breakfast. 90 tablet 3  . pravastatin  (PRAVACHOL) 40 MG tablet Take 40 mg by mouth at bedtime.      No current facility-administered medications for this visit.    Review of Systems  Constitutional: Negative.  Negative for weight loss.       No loss of appetite  HENT: Negative.   Eyes: Negative.   Respiratory: Negative.  Negative for shortness of breath.   Cardiovascular: Negative.  Negative for chest pain.  Gastrointestinal: Negative.  Negative for abdominal pain.  Genitourinary: Negative.   Musculoskeletal: Positive for neck pain (R side).       R sided jaw pain  Skin: Negative.   Neurological: Negative.   Endo/Heme/Allergies: Negative.   Psychiatric/Behavioral: Negative.   All other systems reviewed and are negative. 14 point review of systems was performed and is negative except as detailed under history of present illness and above  PHYSICAL EXAMINATION: ECOG PERFORMANCE STATUS: 0 - Asymptomatic  Blood pressure 129/59, pulse 87, respiratory 18, temperature 97.8, O2 sat 98%, weight 170.3 pounds  Physical Exam  Constitutional: She is oriented to person, place, and time and well-developed, well-nourished, and in no distress.  HENT:  Head: Normocephalic and atraumatic.  Nose: Nose normal.  Mouth/Throat: Oropharynx is clear and moist. No oropharyngeal exudate.  Eyes: Conjunctivae and EOM are normal. Pupils are equal, round, and reactive to light. Right eye exhibits no discharge. Left eye exhibits no discharge. No scleral icterus.  Neck: Normal range of motion. Neck supple. No tracheal deviation present. No thyromegaly present.  Cardiovascular: Normal rate, regular rhythm and normal heart sounds. Exam reveals no gallop and no friction rub.  No murmur heard. Pulmonary/Chest: Effort normal and breath sounds normal. She has no wheezes. She has no rales.  Abdominal: Soft. Bowel sounds are normal. She exhibits no distension and no mass. There is no tenderness. There is no rebound and no guarding.  Musculoskeletal: Normal  range of motion. She exhibits no edema.  Lymphadenopathy:    She has no cervical adenopathy.  Neurological: She is alert and oriented to person, place, and time. She has normal reflexes. No cranial nerve deficit. Gait normal. Coordination normal.  Skin: Skin is warm and dry. No rash noted.  Psychiatric: Mood, memory, affect and judgment normal.  Nursing note and vitals reviewed.  LABORATORY DATA:  I have reviewed the data as listed CBC Latest Ref Rng & Units 10/06/2017 01/07/2017 09/02/2016  WBC 4.0 -  10.5 K/uL 6.4 6.8 5.2  Hemoglobin 12.0 - 15.0 g/dL 12.6 12.6 13.4  Hematocrit 36.0 - 46.0 % 39.5 39.0 41.7  Platelets 150 - 400 K/uL 255 213 225   CMP Latest Ref Rng & Units 10/06/2017 09/19/2017 01/07/2017  Glucose 65 - 99 mg/dL 152(H) - 195(H)  BUN 6 - 20 mg/dL 14 - 12  Creatinine 0.44 - 1.00 mg/dL 0.89 0.70 0.82  Sodium 135 - 145 mmol/L 136 - 134(L)  Potassium 3.5 - 5.1 mmol/L 4.6 - 4.1  Chloride 101 - 111 mmol/L 100(L) - 101  CO2 22 - 32 mmol/L 26 - 27  Calcium 8.9 - 10.3 mg/dL 9.4 - 9.2  Total Protein 6.5 - 8.1 g/dL 7.6 - 7.3  Total Bilirubin 0.3 - 1.2 mg/dL 0.7 - 0.3  Alkaline Phos 38 - 126 U/L 62 - 69  AST 15 - 41 U/L 30 - 23  ALT 14 - 54 U/L 18 - 17   RADIOGRAPHIC STUDIES: I have reviewed the imaging below.   CT Chest wo Contrast 09/02/2016 IMPRESSION: 1. The left lower lobe pulmonary nodule is similar, 6 mm. Favored to represent a subpleural lymph node. Given the extent of emphysema, consider follow-up in May of 2018, to confirm 1 year stability. This recommendation follows the consensus statement: Guidelines for Management of Small Pulmonary Nodules Detected on CT Images: From the Fleischner Society 2017; Radiology 2017; 284:228-243. 2. No thoracic adenopathy. 3.  Coronary artery atherosclerosis. Aortic atherosclerosis. 4. Moderate to large hiatal hernia. Esophageal air fluid level suggests dysmotility or gastroesophageal reflux.  PATHOLOGY:    ASSESSMENT & PLAN:    NHL, Malt Lymphoma H. Pylori Negative Tobacco Use LLL pulmonary nodule  Clinically NED. EGD 05/2017 was negative for recurrent MALToma. CT chest w/o contrast 03/2017 demonstrated stable pulm nodule. CT abd/pelvis 09/19/17 was negative for recurrent MALToma. RTC in 6 months for follow up with labs.  Orders Placed This Encounter  Procedures  . CBC with Differential    Standing Status:   Future    Standing Expiration Date:   10/06/2018  . Comprehensive metabolic panel    Standing Status:   Future    Standing Expiration Date:   10/06/2018    All questions were answered. The patient knows to call the clinic with any problems, questions or concerns.   This note was electronically signed.    Twana First, MD  10/06/2017 1:38 PM

## 2017-10-19 ENCOUNTER — Encounter: Payer: Self-pay | Admitting: Gastroenterology

## 2017-10-19 ENCOUNTER — Ambulatory Visit (INDEPENDENT_AMBULATORY_CARE_PROVIDER_SITE_OTHER): Payer: Medicare Other | Admitting: Gastroenterology

## 2017-10-19 VITALS — BP 110/67 | HR 83 | Temp 97.9°F | Ht 66.0 in | Wt 168.2 lb

## 2017-10-19 DIAGNOSIS — R1030 Lower abdominal pain, unspecified: Secondary | ICD-10-CM

## 2017-10-19 DIAGNOSIS — C884 Extranodal marginal zone B-cell lymphoma of mucosa-associated lymphoid tissue [MALT-lymphoma]: Secondary | ICD-10-CM

## 2017-10-19 NOTE — Assessment & Plan Note (Signed)
Symptoms resolved. She will call if any further problems.

## 2017-10-19 NOTE — Assessment & Plan Note (Signed)
Return 12/3017. Consider EGD if recommended by oncology.

## 2017-10-19 NOTE — Patient Instructions (Signed)
1. Please call if you have any recurrent problems. 2. Hope you have a Merry Christmas!

## 2017-10-19 NOTE — Progress Notes (Signed)
cc'ed to pcp °

## 2017-10-19 NOTE — Progress Notes (Signed)
Primary Care Physician: Manon Hilding, MD  Primary Gastroenterologist:  Garfield Cornea, MD   Chief Complaint  Patient presents with  . Abdominal Pain    f/u, doing ok    HPI: Monica [name] is a 76 y.o. female here for follow up. Seen in 09/2017. She has a history of stage I gastric MALT lymphoma.  Diagnosed at time of EGD in April 2017.  Treated with radiation alone from May - June 2017.  Posttreatment EGD November 2017 revealed chronic gastritis but no evidence of lymphoid cells.  H. pylori negative.  She had another EGD in July for dysphagia.  Schatzki ring dilated.  Moderate to large hiatal hernia noted.  No evidence of recurrent tumor.  Few localized erosions in the stomach but no specimens collected.  When I saw her back in November she had 2-week history of lower abdominal discomfort.  Had been treated for UTI by PCP.  Only 25% improved when I saw her.  Also with belching, gas, flatulence.  She had a CT to rule out diverticulitis.  No evidence of diverticulitis.  Moderate sized hiatal hernia but no evidence of stomach mass or wall thickening.  Mild fatty liver.  I encouraged her to repeat urinalysis with culture to make sure that her UTI had been cleared.  She wanted to see her urologist first, already have an appointment set up the following week.  She states when she saw her urologist they diagnosed her with yeast infection.  After taking Diflucan her abdominal pain went away.  At this point she is feeling great.  No upper GI symptoms.  No bowel concerns.  No abdominal pain.  Current Outpatient Medications  Medication Sig Dispense Refill  . Artificial Tear Solution (SOOTHE XP) SOLN Place 2 drops into both eyes daily as needed (dry eyes).    Marland Kitchen aspirin 81 MG tablet Take 81 mg by mouth at bedtime.     . Calcium-Magnesium-Vitamin D (CALCIUM 500 PO) Take 500 mg by mouth at bedtime.     . citalopram (CELEXA) 20 MG tablet Take 20 mg by mouth daily.     Marland Kitchen ESTRACE VAGINAL 0.1 MG/GM  vaginal cream Place 1 Applicatorful vaginally 2 (two) times a week. Monday and Friday    . metFORMIN (GLUCOPHAGE-XR) 500 MG 24 hr tablet Take 500 mg 2 (two) times daily by mouth.     . metoprolol tartrate (LOPRESSOR) 25 MG tablet Take 25 mg by mouth at bedtime.     . pantoprazole (PROTONIX) 40 MG tablet Take 1 tablet (40 mg total) by mouth daily before breakfast. 90 tablet 3  . pravastatin (PRAVACHOL) 40 MG tablet Take 40 mg by mouth at bedtime.      No current facility-administered medications for this visit.     Allergies as of 10/19/2017 - Review Complete 10/19/2017  Allergen Reaction Noted  . Sulfonamide derivatives Hives   . Sulfur  06/04/2016    ROS:  General: Negative for anorexia, weight loss, fever, chills, fatigue, weakness. ENT: Negative for hoarseness, difficulty swallowing , nasal congestion. CV: Negative for chest pain, angina, palpitations, dyspnea on exertion, peripheral edema.  Respiratory: Negative for dyspnea at rest, dyspnea on exertion, cough, sputum, wheezing.  GI: See history of present illness. GU:  Negative for dysuria, hematuria, urinary incontinence, urinary frequency, nocturnal urination.  Endo: Negative for unusual weight change.    Physical Examination:   BP 110/67   Pulse 83   Temp 97.9 F (36.6 C) (Oral)  Ht 5\' 6"  (1.676 m)   Wt 168 lb 3.2 oz (76.3 kg)   BMI 27.15 kg/m   General: Well-nourished, well-developed in no acute distress.  Eyes: No icterus. Mouth: Oropharyngeal mucosa moist and pink , no lesions erythema or exudate. Lungs: Clear to auscultation bilaterally.  Heart: Regular rate and rhythm, no murmurs rubs or gallops.  Abdomen: Bowel sounds are normal, nontender, nondistended, no hepatosplenomegaly or masses, no abdominal bruits or hernia , no rebound or guarding.   Extremities: No lower extremity edema. No clubbing or deformities. Neuro: Alert and oriented x 4   Skin: Warm and dry, no jaundice.   Psych: Alert and cooperative,  normal mood and affect.  Labs:  Lab Results  Component Value Date   CREATININE 0.89 10/06/2017   BUN 14 10/06/2017   NA 136 10/06/2017   K 4.6 10/06/2017   CL 100 (L) 10/06/2017   CO2 26 10/06/2017   Lab Results  Component Value Date   ALT 18 10/06/2017   AST 30 10/06/2017   ALKPHOS 62 10/06/2017   BILITOT 0.7 10/06/2017   Lab Results  Component Value Date   WBC 6.4 10/06/2017   HGB 12.6 10/06/2017   HCT 39.5 10/06/2017   MCV 89.8 10/06/2017   PLT 255 10/06/2017    Imaging Studies: Ct Abdomen Pelvis W Contrast  Result Date: 09/19/2017 CLINICAL DATA:  left lower abdomina pain and nausea x 2 weeks worsening in the last week; hysterectomy; hx gastric lymphoma may 2017/rad tx EXAM: CT ABDOMEN AND PELVIS WITH CONTRAST TECHNIQUE: Multidetector CT imaging of the abdomen and pelvis was performed using the standard protocol following bolus administration of intravenous contrast. CONTRAST:  155mL ISOVUE-300 IOPAMIDOL (ISOVUE-300) INJECTION 61% COMPARISON:  11/05/2014 FINDINGS: Lower chest: Moderate sized hiatal hernia stable. Patchy opacity noted in the left upper lobe lingula and left lower lobe at the base, consistent with scarring or chronic atelectasis, is also stable. No acute findings at the lung bases. Hepatobiliary: Decreased attenuation of the liver is noted consistent with mild fatty infiltration. No liver mass or focal lesion. Normal gallbladder. No bile duct dilation. Pancreas: Unremarkable. No pancreatic ductal dilatation or surrounding inflammatory changes. Spleen: Normal in size without focal abnormality. Adrenals/Urinary Tract: No adrenal masses. 3.4 cm lower pole right renal cyst. No other renal masses, no stones and no hydronephrosis. Normal ureters. Normal bladder. Stomach/Bowel: No stomach masses or inflammation. Normal small bowel. There are numerous left colon diverticula mostly along the sigmoid. No diverticulitis or other colonic inflammatory process. Normal appendix  visualized. Vascular/Lymphatic: No pathologically enlarged lymph nodes. Atherosclerotic disease is noted throughout the abdominal aorta. No aneurysm. Reproductive: Status post hysterectomy. No adnexal masses. Other: No abdominal wall hernia or abnormality. No abdominopelvic ascites. Musculoskeletal: No fracture or acute finding. No osteoblastic or osteolytic lesions. IMPRESSION: 1. No acute findings within the abdomen or pelvis. 2. Mild hepatic steatosis. 3. Moderate hiatal hernia. No stomach mass or wall thickening to suggest recurrent gastric lymphoma. 4. 3.4 cm lower pole right renal cyst. 5. Status post hysterectomy. 6. Left colon diverticula without evidence of diverticulitis. 7. Aortic atherosclerosis. Electronically Signed   By: Lajean Manes M.D.   On: 09/19/2017 18:04

## 2017-11-07 DIAGNOSIS — F17210 Nicotine dependence, cigarettes, uncomplicated: Secondary | ICD-10-CM | POA: Diagnosis not present

## 2017-11-07 DIAGNOSIS — Z6827 Body mass index (BMI) 27.0-27.9, adult: Secondary | ICD-10-CM | POA: Diagnosis not present

## 2017-11-07 DIAGNOSIS — E1165 Type 2 diabetes mellitus with hyperglycemia: Secondary | ICD-10-CM | POA: Diagnosis not present

## 2017-11-07 DIAGNOSIS — C884 Extranodal marginal zone B-cell lymphoma of mucosa-associated lymphoid tissue [MALT-lymphoma]: Secondary | ICD-10-CM | POA: Diagnosis not present

## 2017-11-07 DIAGNOSIS — J209 Acute bronchitis, unspecified: Secondary | ICD-10-CM | POA: Diagnosis not present

## 2017-11-23 DIAGNOSIS — F411 Generalized anxiety disorder: Secondary | ICD-10-CM | POA: Diagnosis not present

## 2017-11-23 DIAGNOSIS — E782 Mixed hyperlipidemia: Secondary | ICD-10-CM | POA: Diagnosis not present

## 2017-11-23 DIAGNOSIS — F17210 Nicotine dependence, cigarettes, uncomplicated: Secondary | ICD-10-CM | POA: Diagnosis not present

## 2017-11-23 DIAGNOSIS — E1165 Type 2 diabetes mellitus with hyperglycemia: Secondary | ICD-10-CM | POA: Diagnosis not present

## 2017-11-23 DIAGNOSIS — I10 Essential (primary) hypertension: Secondary | ICD-10-CM | POA: Diagnosis not present

## 2017-11-25 DIAGNOSIS — Z6826 Body mass index (BMI) 26.0-26.9, adult: Secondary | ICD-10-CM | POA: Diagnosis not present

## 2017-11-25 DIAGNOSIS — I10 Essential (primary) hypertension: Secondary | ICD-10-CM | POA: Diagnosis not present

## 2017-11-25 DIAGNOSIS — R05 Cough: Secondary | ICD-10-CM | POA: Diagnosis not present

## 2017-11-25 DIAGNOSIS — E782 Mixed hyperlipidemia: Secondary | ICD-10-CM | POA: Diagnosis not present

## 2017-11-25 DIAGNOSIS — E1165 Type 2 diabetes mellitus with hyperglycemia: Secondary | ICD-10-CM | POA: Diagnosis not present

## 2017-11-25 DIAGNOSIS — C884 Extranodal marginal zone B-cell lymphoma of mucosa-associated lymphoid tissue [MALT-lymphoma]: Secondary | ICD-10-CM | POA: Diagnosis not present

## 2017-11-25 DIAGNOSIS — F411 Generalized anxiety disorder: Secondary | ICD-10-CM | POA: Diagnosis not present

## 2017-12-24 ENCOUNTER — Telehealth: Payer: Self-pay | Admitting: Gastroenterology

## 2017-12-24 NOTE — Telephone Encounter (Signed)
Please let patient know her next EGD is due in 05/2018.   Please let the patient know that if she is feeling well we can move her 12/2017 appt to 04/2018 and schedule EGD at that time.   Please NIC EGD for 05/2018

## 2017-12-24 NOTE — Telephone Encounter (Signed)
-----   Message from Monica First, MD sent at 10/19/2017  3:10 PM EST ----- Monica [name], there is no optimal interval for follow up endoscopy and imaging for MALT lymphoma on NCCN guidelines. I would recommend that she gets an endoscopy annually for 5 years.  Thank you, Monica [name]  ----- Message ----- From: Monica [name] Sent: 10/19/2017  12:47 PM To: Monica First, MD, Monica Menghini, PA-C  Good afternoon.  I wanted to request further input regarding this mutual patient.  Are there specific guidelines as far as surveillance endoscopies given her history of MALT lymphoma?  We have been following your lead on this thus far. I would appreciate any input from your expertise. Thanks!  Monica [name]

## 2018-01-10 ENCOUNTER — Ambulatory Visit: Payer: Medicare Other | Admitting: Gastroenterology

## 2018-01-23 DIAGNOSIS — J111 Influenza due to unidentified influenza virus with other respiratory manifestations: Secondary | ICD-10-CM | POA: Diagnosis not present

## 2018-01-23 DIAGNOSIS — J189 Pneumonia, unspecified organism: Secondary | ICD-10-CM | POA: Diagnosis not present

## 2018-02-24 DIAGNOSIS — N3020 Other chronic cystitis without hematuria: Secondary | ICD-10-CM | POA: Diagnosis not present

## 2018-03-01 DIAGNOSIS — Z85828 Personal history of other malignant neoplasm of skin: Secondary | ICD-10-CM | POA: Diagnosis not present

## 2018-03-01 DIAGNOSIS — L309 Dermatitis, unspecified: Secondary | ICD-10-CM | POA: Diagnosis not present

## 2018-03-01 DIAGNOSIS — L570 Actinic keratosis: Secondary | ICD-10-CM | POA: Diagnosis not present

## 2018-03-17 DIAGNOSIS — I10 Essential (primary) hypertension: Secondary | ICD-10-CM | POA: Diagnosis not present

## 2018-03-17 DIAGNOSIS — E782 Mixed hyperlipidemia: Secondary | ICD-10-CM | POA: Diagnosis not present

## 2018-03-17 DIAGNOSIS — F17210 Nicotine dependence, cigarettes, uncomplicated: Secondary | ICD-10-CM | POA: Diagnosis not present

## 2018-03-17 DIAGNOSIS — E1165 Type 2 diabetes mellitus with hyperglycemia: Secondary | ICD-10-CM | POA: Diagnosis not present

## 2018-03-18 DIAGNOSIS — K5732 Diverticulitis of large intestine without perforation or abscess without bleeding: Secondary | ICD-10-CM | POA: Diagnosis not present

## 2018-03-18 DIAGNOSIS — Z6825 Body mass index (BMI) 25.0-25.9, adult: Secondary | ICD-10-CM | POA: Diagnosis not present

## 2018-03-18 DIAGNOSIS — R102 Pelvic and perineal pain: Secondary | ICD-10-CM | POA: Diagnosis not present

## 2018-03-23 DIAGNOSIS — Z720 Tobacco use: Secondary | ICD-10-CM | POA: Diagnosis not present

## 2018-03-23 DIAGNOSIS — Z23 Encounter for immunization: Secondary | ICD-10-CM | POA: Diagnosis not present

## 2018-03-23 DIAGNOSIS — I10 Essential (primary) hypertension: Secondary | ICD-10-CM | POA: Diagnosis not present

## 2018-03-23 DIAGNOSIS — E782 Mixed hyperlipidemia: Secondary | ICD-10-CM | POA: Diagnosis not present

## 2018-03-23 DIAGNOSIS — F411 Generalized anxiety disorder: Secondary | ICD-10-CM | POA: Diagnosis not present

## 2018-03-23 DIAGNOSIS — E1165 Type 2 diabetes mellitus with hyperglycemia: Secondary | ICD-10-CM | POA: Diagnosis not present

## 2018-03-23 DIAGNOSIS — Z1331 Encounter for screening for depression: Secondary | ICD-10-CM | POA: Diagnosis not present

## 2018-03-23 DIAGNOSIS — Z1389 Encounter for screening for other disorder: Secondary | ICD-10-CM | POA: Diagnosis not present

## 2018-03-27 ENCOUNTER — Other Ambulatory Visit (HOSPITAL_COMMUNITY): Payer: Self-pay | Admitting: *Deleted

## 2018-03-27 DIAGNOSIS — C884 Extranodal marginal zone b-cell lymphoma of mucosa-associated lymphoid tissue (malt-lymphoma) not having achieved remission: Secondary | ICD-10-CM

## 2018-03-29 DIAGNOSIS — N3020 Other chronic cystitis without hematuria: Secondary | ICD-10-CM | POA: Diagnosis not present

## 2018-03-30 ENCOUNTER — Inpatient Hospital Stay (HOSPITAL_COMMUNITY): Payer: Medicare Other | Attending: Hematology

## 2018-03-30 DIAGNOSIS — Z8572 Personal history of non-Hodgkin lymphomas: Secondary | ICD-10-CM | POA: Insufficient documentation

## 2018-03-30 DIAGNOSIS — C884 Extranodal marginal zone B-cell lymphoma of mucosa-associated lymphoid tissue [MALT-lymphoma]: Secondary | ICD-10-CM

## 2018-03-30 LAB — CBC WITH DIFFERENTIAL/PLATELET
Basophils Absolute: 0 10*3/uL (ref 0.0–0.1)
Basophils Relative: 1 %
EOS ABS: 0.2 10*3/uL (ref 0.0–0.7)
EOS PCT: 2 %
HCT: 39 % (ref 36.0–46.0)
HEMOGLOBIN: 12.9 g/dL (ref 12.0–15.0)
LYMPHS ABS: 2.4 10*3/uL (ref 0.7–4.0)
Lymphocytes Relative: 30 %
MCH: 29.3 pg (ref 26.0–34.0)
MCHC: 33.1 g/dL (ref 30.0–36.0)
MCV: 88.4 fL (ref 78.0–100.0)
MONO ABS: 0.6 10*3/uL (ref 0.1–1.0)
MONOS PCT: 8 %
NEUTROS PCT: 59 %
Neutro Abs: 4.7 10*3/uL (ref 1.7–7.7)
Platelets: 254 10*3/uL (ref 150–400)
RBC: 4.41 MIL/uL (ref 3.87–5.11)
RDW: 14.8 % (ref 11.5–15.5)
WBC: 7.8 10*3/uL (ref 4.0–10.5)

## 2018-03-30 LAB — COMPREHENSIVE METABOLIC PANEL
ALK PHOS: 54 U/L (ref 38–126)
ALT: 17 U/L (ref 14–54)
ANION GAP: 10 (ref 5–15)
AST: 23 U/L (ref 15–41)
Albumin: 3.9 g/dL (ref 3.5–5.0)
BUN: 12 mg/dL (ref 6–20)
CALCIUM: 9.5 mg/dL (ref 8.9–10.3)
CO2: 27 mmol/L (ref 22–32)
Chloride: 102 mmol/L (ref 101–111)
Creatinine, Ser: 0.73 mg/dL (ref 0.44–1.00)
GFR calc non Af Amer: >60 mL/min (ref >60)
Glucose, Bld: 144 mg/dL — ABNORMAL HIGH (ref 65–99)
Potassium: 4.9 mmol/L (ref 3.5–5.1)
Sodium: 139 mmol/L (ref 135–145)
TOTAL PROTEIN: 7.5 g/dL (ref 6.5–8.1)
Total Bilirubin: 0.7 mg/dL (ref 0.3–1.2)

## 2018-04-06 ENCOUNTER — Other Ambulatory Visit: Payer: Self-pay

## 2018-04-06 ENCOUNTER — Inpatient Hospital Stay (HOSPITAL_COMMUNITY): Payer: Medicare Other | Attending: Hematology | Admitting: Internal Medicine

## 2018-04-06 ENCOUNTER — Encounter (HOSPITAL_COMMUNITY): Payer: Self-pay | Admitting: Internal Medicine

## 2018-04-06 VITALS — BP 163/74 | HR 78 | Temp 98.1°F | Resp 16 | Wt 162.0 lb

## 2018-04-06 DIAGNOSIS — Z8572 Personal history of non-Hodgkin lymphomas: Secondary | ICD-10-CM | POA: Diagnosis not present

## 2018-04-06 DIAGNOSIS — F17210 Nicotine dependence, cigarettes, uncomplicated: Secondary | ICD-10-CM

## 2018-04-06 DIAGNOSIS — R918 Other nonspecific abnormal finding of lung field: Secondary | ICD-10-CM | POA: Diagnosis not present

## 2018-04-06 DIAGNOSIS — E119 Type 2 diabetes mellitus without complications: Secondary | ICD-10-CM | POA: Diagnosis not present

## 2018-04-06 DIAGNOSIS — R109 Unspecified abdominal pain: Secondary | ICD-10-CM

## 2018-04-06 DIAGNOSIS — R102 Pelvic and perineal pain: Secondary | ICD-10-CM

## 2018-04-06 DIAGNOSIS — C884 Extranodal marginal zone B-cell lymphoma of mucosa-associated lymphoid tissue [MALT-lymphoma]: Secondary | ICD-10-CM

## 2018-04-06 NOTE — Progress Notes (Signed)
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[place]) - Plan: NM PET Image Restag (PS) Skull Base To Thigh, MM Digital Screening, CBC with Differential/Platelet, Comprehensive metabolic panel, Lactate dehydrogenase, Beta 2 microglobulin, serum  Lung nodules - Plan: NM PET Image Restag (PS) Skull Base To Thigh, MM Digital Screening, CBC with Differential/Platelet, Comprehensive metabolic panel, Lactate dehydrogenase, Beta 2 microglobulin, serum  Staging Cancer Staging MALToma (HCC) Staging form: Lymphoid Neoplasms, AJCC 6th Edition - Clinical stage from 03/23/2016: Stage I - Signed by Baird Cancer, PA-C on 03/23/2016   Assessment and Plan: 1.  NHL, Gastric Malt Lymphoma.  Pt was previously followed by Dr. Talbert Cage.  She was diagnosed in 2016.  She was H. Pylori Negative.  She was treated with RT.    CT of abdomen and pelvis done 09/19/2018 showed  IMPRESSION: 1. No acute findings within the abdomen or pelvis. 2. Mild hepatic steatosis. 3. Moderate hiatal hernia. No stomach mass or wall thickening to suggest recurrent gastric lymphoma. 4. 3.4 cm lower pole right renal cyst. 5. Status post hysterectomy. 6. Left colon diverticula without evidence of diverticulitis. 7. Aortic atherosclerosis.  She is complaining of some abdominal and pelvic discomfort.  She will be set up for PET scan for repeat evaluation, especially based on lung nodules noted on prior CT.  She will be notified of results and will  Continue to be seen every  6 months with labs.  Labs done 03/30/2018 reviewed with pt and show WBC 7.8 HB 12.9 Plts 254,000.  Chemistries WNL with Cr 0.73.  All questions answered and she expressed understanding of the information presented.    2.  Abdominal and pelvic pain. She is set up for imaging with PET scan.  Labs done 03/30/2018 WNL.  Pending results she may be referred to GI.    3.  Smoking.  Pt continues to smoke.  Cessation recommended.  She had CT chest done 03/02/2017 that  showed  IMPRESSION: 1. Previously noted small left-sided pulmonary nodules are stable compared to prior examinations, considered benign at this time, presumably subpleural lymph nodes. No larger more suspicious appearing pulmonary nodules or masses are noted. 2. Mild diffuse bronchial wall thickening with moderate centrilobular and mild paraseptal emphysema; imaging findings suggestive of underlying COPD. 3. Dilatation of the pulmonic trunk (3.5 cm in diameter), suggestive of pulmonary arterial hypertension. 4. Aortic atherosclerosis,  She will be set up for PET scan due to pulmonary nodules due to ongoing smoking and lymphoma history.    4.  DM.  Continue to follow-up with PCP.    5.  Health maintenance.  She has not undergone mammogram and I discussed with her she is recommended for breast imaging based on history of lymphoma.  She is set up for screening mammogram.     Interval History:  77 yr old female previously followed by Dr. Talbert Cage.  repeat upper endoscopy performed on 05/10/2017 which demonstrated no sign of residual or recurrent lymphoma.  CT abdomen pelvis with contrast on 09/19/2017 demonstrated no stomach mass or wall thickening to suggest recurrent gastric lymphoma.  Repeat CT chest without contrast to monitor her pulmonary nodules on 03/02/2017 demonstrated previously noted small left-sided pulmonary nodules are stable compared to prior examinations, considered benign at this time, presumably subpleural lymph nodes. No larger more suspicious appearing pulmonary nodules or masses are noted.   Current Status:  Pt is seen today for follow-up.  She is here to go over labs.  She is complaining of some abdominal and pelvic discomfort.  MALToma ([place] [facility])   03/21/2015 Miscellaneous    H Pylori IgG NEGATIVE      02/03/2016 Procedure    EGD Dr. Gala Romney, abnormal gastric mucosa. Pathology with EXTRANODAL Marginal zone lymphoma. NEGATIVE for H. Pylori      03/03/2016 Miscellaneous    H  pylori stool antigen NEGATIVE      03/03/2016 PET scan    Focal area of hypermetabolism and wall thickening involving the body antral junction region of the stomach c/w history of lymphoma. 5.5 mm LLL pulm nodule not hypermetabolic. Non contrast chest CT in 6 month      03/18/2016 - 04/08/2016 Radiation Therapy    The gastric tumor received 30 Gy in 15 fractions of 2 Gy        Problem List Patient Active Problem List   Diagnosis Date Noted  . Flatulence/gas pain/belching [R14.0] 09/16/2017  . Lower abdominal pain [R10.30] 09/16/2017  . Nodule of left lung [R91.1] 01/07/2017  . Nausea without vomiting [R11.0] 04/06/2016  . Loss of weight [R63.4] 04/06/2016  . MALToma ([place]) [C88.4] 03/12/2016  . Mucosal abnormality of stomach [K31.89]   . Schatzki's ring [K22.2]   . Hiatal hernia [K44.9]   . HYPERCHOLESTEROLEMIA [E78.00] 09/02/2010  . DEPRESSION, MILD [F32.9] 09/02/2010  . TACHYCARDIA [R00.0] 09/02/2010  . Dysphagia [R13.10] 09/02/2010    Past Medical History Past Medical History:  Diagnosis Date  . Cancer (HCC)    NHL, Malt Lymphoma  . Depression   . DM (diabetes mellitus) ([place])   . Dysphagia   . Dysrhythmia    History of palpatations  . Heart palpitations   . Hiatal hernia   . Hyperlipidemia   . Interstitial cystitis   . 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 Sinusitis     Past Surgical History Past Surgical History:  Procedure Laterality Date  . BIOPSY  09/01/2016   Procedure: BIOPSY;  Surgeon: Daneil Dolin, MD;  [facility];  Service: Endoscopy;;  gastric  . COLONOSCOPY     Dr. Lindalou Hose 2009: Normal per PCP notes  . ESOPHAGOGASTRODUODENOSCOPY     RMR: Prominant Schzgzki ring/component of peptic stricture status post dilation and disruption as described above, otherwise norma esophagus, moderate-sized hiatal hernia, antal pyloric channel, and posterier bulbar erosions, otherwise unremarkable stomach, D1 and D2 . Inflammatory  findings on the stomach and duodenum will likely be related to aspirin effect. We need to rule out Helicobacter pylor  . ESOPHAGOGASTRODUODENOSCOPY N/A 03/10/2015   Dr. 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 Pathology with lymphoid population of stomach, slight atypia  . ESOPHAGOGASTRODUODENOSCOPY N/A 02/03/2016   Dr. Gala Romney: Schatzki ring noted at GE junction, dilated with 55 and then 45 Steele dilator. Large hiatal hernia. 6 x 7 cm nodular geographically ulcerated mucosa, biopsy c/w MALToma  . ESOPHAGOGASTRODUODENOSCOPY N/A 04/08/2016   Dr. Gala Romney: moderate Schatzki's ring/web s/p dilation, large hiatal hernia, localized area of gastric lymphoma visualized and appeared to be much improved. normal second portion of the duodenum. No specimens collected. Esophageal lumen notably tighted up significantly since her dilation in April of this year.   . ESOPHAGOGASTRODUODENOSCOPY N/A 08/04/2016   Procedure: ESOPHAGOGASTRODUODENOSCOPY (EGD);  Surgeon: Daneil Dolin, MD;  [facility];  Service: Endoscopy;  Laterality: N/A;  7:30 am  . ESOPHAGOGASTRODUODENOSCOPY N/A 09/01/2016   Procedure: ESOPHAGOGASTRODUODENOSCOPY (EGD);  Surgeon: Daneil Dolin, MD;  [facility];  Service: Endoscopy;  Laterality: N/A;  830   . ESOPHAGOGASTRODUODENOSCOPY N/A 05/10/2017   Procedure: ESOPHAGOGASTRODUODENOSCOPY (EGD);  Surgeon: Daneil Dolin, MD;  [facility];  Service: Endoscopy;  Laterality: N/A;  7:30am  . MALONEY DILATION N/A 03/10/2015   Procedure: Venia Minks DILATION;  Surgeon: Daneil Dolin, MD;  [facility];  Service: Endoscopy;  Laterality: N/A;  . Venia Minks DILATION N/A 02/03/2016   Procedure: Venia Minks DILATION;  Surgeon: Daneil Dolin, MD;  [facility];  Service: Endoscopy;  Laterality: N/A;  . Venia Minks DILATION N/A 04/08/2016   Procedure: Venia Minks DILATION;  Surgeon: Daneil Dolin, MD;   [facility];  Service: Endoscopy;  Laterality: N/A;  . Venia Minks DILATION N/A 05/10/2017   Procedure: Venia Minks DILATION;  Surgeon: Daneil Dolin, MD;  [facility];  Service: Endoscopy;  Laterality: N/A;    Family History Family History  Problem Relation Age of Onset  . Heart attack Father   . Dementia Father   . Diabetes Sister   . Diabetes Brother   . Diabetes Sister   . Colon cancer Neg Hx      Social History  reports that she has been smoking cigarettes.  She has a 42.75 pack-year smoking history. She has never used smokeless tobacco. She reports that she does not drink alcohol or use drugs.  Medications  Current Outpatient Medications:  .  Artificial Tear Solution (SOOTHE XP) SOLN, Place 2 drops into both eyes daily as needed (dry eyes)., Disp: , Rfl:  .  aspirin 81 MG tablet, Take 81 mg by mouth at bedtime. , Disp: , Rfl:  .  Calcium-Magnesium-Vitamin D (CALCIUM 500 PO), Take 500 mg by mouth at bedtime. , Disp: , Rfl:  .  citalopram (CELEXA) 20 MG tablet, Take 20 mg by mouth daily. , Disp: , Rfl:  .  doxycycline (VIBRAMYCIN) 100 MG capsule, TAKE 1 CAPSULE BY MOUTH EVERY OTHER DAY BEFORE BED AND TAKE CALCIUM CITRATE ON OPPOSITE DAYS, Disp: , Rfl: 5 .  metFORMIN (GLUCOPHAGE-XR) 500 MG 24 hr tablet, Take 500 mg 2 (two) times daily by mouth. , Disp: , Rfl:  .  metoprolol tartrate (LOPRESSOR) 25 MG tablet, Take 25 mg by mouth at bedtime. , Disp: , Rfl:  .  pantoprazole (PROTONIX) 40 MG tablet, Take 1 tablet (40 mg total) by mouth daily before breakfast., Disp: 90 tablet, Rfl: 3 .  pravastatin (PRAVACHOL) 40 MG tablet, Take 40 mg by mouth at bedtime. , Disp: , Rfl:   Allergies Sulfonamide derivatives and Sulfur  Review of Systems Review of Systems - Oncology ROS as per HPI otherwise 12 point ROS is negative other than abdominal and pelvic discomfort.     Physical Exam  Vitals Wt Readings from Last 3 Encounters:  04/06/18 162 lb (73.5 kg)  10/19/17 168  lb 3.2 oz (76.3 kg)  10/06/17 170 lb 4.8 oz (77.2 kg)   Temp Readings from Last 3 Encounters:  04/06/18 98.1 F (36.7 C) (Oral)  10/19/17 97.9 F (36.6 C) (Oral)  10/06/17 97.8 F (36.6 C) (Oral)   BP Readings from Last 3 Encounters:  04/06/18 (!) 163/74  10/19/17 110/67  10/06/17 (!) 129/59   Pulse Readings from Last 3 Encounters:  04/06/18 78  10/19/17 83  10/06/17 87    Constitutional: Well-developed, well-nourished, and in no distress.   HENT: Head: Normocephalic and atraumatic.  Mouth/Throat: No oropharyngeal exudate. Mucosa moist. Eyes: Pupils are equal, round, and reactive to light. Conjunctivae are normal. No scleral icterus.  Neck: Normal range of motion. Neck supple. No JVD present.  Cardiovascular: Normal rate, regular rhythm and normal heart sounds.  Exam reveals no gallop and no friction rub.   No murmur heard. Pulmonary/Chest: Effort normal and breath sounds normal. No respiratory distress. No wheezes.No rales.  Abdominal: Soft. Bowel sounds are normal. No distension. There is no tenderness. There is no guarding.  She reports some pelvic pain.   Musculoskeletal: No edema or tenderness.  Lymphadenopathy: No cervical, axillary or supraclavicular adenopathy.  Neurological: Alert and oriented to person, place, and time. No cranial nerve deficit.  Skin: Skin is warm and dry. No rash noted. No erythema. No pallor.  Psychiatric: Affect and judgment normal.   Labs No visits with results within 3 Day(s) from this visit.  Latest known visit with results is:  Appointment on 03/30/2018  Component Date Value Ref Range Status  . WBC 03/30/2018 7.8  4.0 - 10.5 K/uL Final  . RBC 03/30/2018 4.41  3.87 - 5.11 MIL/uL Final  . Hemoglobin 03/30/2018 12.9  12.0 - 15.0 g/dL Final  . HCT 03/30/2018 39.0  36.0 - 46.0 % Final  . MCV 03/30/2018 88.4  78.0 - 100.0 fL Final  . MCH 03/30/2018 29.3  26.0 - 34.0 pg Final  . MCHC 03/30/2018 33.1  30.0 - 36.0 g/dL Final  . RDW 03/30/2018  14.8  11.5 - 15.5 % Final  . Platelets 03/30/2018 254  150 - 400 K/uL Final  . Neutrophils Relative % 03/30/2018 59  % Final  . Neutro Abs 03/30/2018 4.7  1.7 - 7.7 K/uL Final  . Lymphocytes Relative 03/30/2018 30  % Final  . Lymphs Abs 03/30/2018 2.4  0.7 - 4.0 K/uL Final  . Monocytes Relative 03/30/2018 8  % Final  . Monocytes Absolute 03/30/2018 0.6  0.1 - 1.0 K/uL Final  . Eosinophils Relative 03/30/2018 2  % Final  . Eosinophils Absolute 03/30/2018 0.2  0.0 - 0.7 K/uL Final  . Basophils Relative 03/30/2018 1  % Final  . Basophils Absolute 03/30/2018 0.0  0.0 - 0.1 K/uL Final   Performed at [facility], [address] [place], [place] [address]  . Sodium 03/30/2018 139  135 - 145 mmol/L Final  . Potassium 03/30/2018 4.9  3.5 - 5.1 mmol/L Final  . Chloride 03/30/2018 102  101 - 111 mmol/L Final  . CO2 03/30/2018 27  22 - 32 mmol/L Final  . Glucose, Bld 03/30/2018 144* 65 - 99 mg/dL Final  . BUN 03/30/2018 12  6 - 20 mg/dL Final  . Creatinine, Ser 03/30/2018 0.73  0.44 - 1.00 mg/dL Final  . Calcium 03/30/2018 9.5  8.9 - 10.3 mg/dL Final  . Total Protein 03/30/2018 7.5  6.5 - 8.1 g/dL Final  . Albumin 03/30/2018 3.9  3.5 - 5.0 g/dL Final  . AST 03/30/2018 23  15 - 41 U/L Final  . ALT 03/30/2018 17  14 - 54 U/L Final  . Alkaline Phosphatase 03/30/2018 54  38 - 126 U/L Final  . Total Bilirubin 03/30/2018 0.7  0.3 - 1.2 mg/dL Final  . GFR calc non Af Amer 03/30/2018 >60  >60 mL/min Final  . GFR calc Af Amer 03/30/2018 >60  >60 mL/min Final   Comment: (NOTE) The eGFR has been calculated using the CKD EPI equation. This calculation has not been validated in all clinical situations. eGFR's persistently <60 mL/min signify possible Chronic Kidney Disease.   . Anion gap 03/30/2018 10  5 - 15 Final   Performed  at [facility], [address], [place], [place] [address]     Pathology Orders Placed This Encounter  Procedures  . NM PET Image Restag (PS) Skull Base To Thigh    Lung  nodules, smoking history    Standing Status:   Future    Standing Expiration Date:   04/06/2019    Order Specific Question:   If indicated for the ordered procedure, I authorize the administration of a radiopharmaceutical per Radiology protocol    Answer:   Yes    Order Specific Question:   Preferred imaging location?    Answer:   [facility]    Order Specific Question:   Radiology Contrast Protocol - do NOT remove file path    Answer:   [email or web address]  . MM Digital Screening    Standing Status:   Future    Standing Expiration Date:   04/06/2019    Order Specific Question:   Reason for Exam (SYMPTOM  OR DIAGNOSIS REQUIRED)    Answer:   screening,    Order Specific Question:   Preferred imaging location?    Answer:   [facility]  . CBC with Differential/Platelet    Standing Status:   Future    Standing Expiration Date:   04/07/2019  . Comprehensive metabolic panel    Standing Status:   Future    Standing Expiration Date:   04/07/2019  . Lactate dehydrogenase    Standing Status:   Future    Standing Expiration Date:   04/07/2019  . Beta 2 microglobulin, serum    Standing Status:   Future    Standing Expiration Date:   04/07/2019       Zoila Shutter MD

## 2018-04-10 ENCOUNTER — Ambulatory Visit (HOSPITAL_COMMUNITY)
Admission: RE | Admit: 2018-04-10 | Discharge: 2018-04-10 | Disposition: A | Discharge status: Home / Self Care | Payer: Medicare Other | Source: Ambulatory Visit | Attending: Internal Medicine | Admitting: Internal Medicine

## 2018-04-10 ENCOUNTER — Ambulatory Visit (HOSPITAL_COMMUNITY): Payer: Medicare Other

## 2018-04-10 DIAGNOSIS — K449 Diaphragmatic hernia without obstruction or gangrene: Secondary | ICD-10-CM | POA: Insufficient documentation

## 2018-04-10 DIAGNOSIS — I2510 Atherosclerotic heart disease of native coronary artery without angina pectoris: Secondary | ICD-10-CM | POA: Diagnosis not present

## 2018-04-10 DIAGNOSIS — I700 Atherosclerosis of aorta: Secondary | ICD-10-CM | POA: Insufficient documentation

## 2018-04-10 DIAGNOSIS — R918 Other nonspecific abnormal finding of lung field: Secondary | ICD-10-CM | POA: Insufficient documentation

## 2018-04-10 DIAGNOSIS — C884 Extranodal marginal zone B-cell lymphoma of mucosa-associated lymphoid tissue [MALT-lymphoma]: Secondary | ICD-10-CM | POA: Insufficient documentation

## 2018-04-10 DIAGNOSIS — J432 Centrilobular emphysema: Secondary | ICD-10-CM | POA: Diagnosis not present

## 2018-04-10 DIAGNOSIS — K5730 Diverticulosis of large intestine without perforation or abscess without bleeding: Secondary | ICD-10-CM | POA: Diagnosis not present

## 2018-04-10 MED ORDER — FLUDEOXYGLUCOSE F - 18 (FDG) INJECTION
10.0000 | Freq: Once | INTRAVENOUS | Status: AC | PRN
  Administered 2018-04-10: 11.88 via INTRAVENOUS

## 2018-04-12 ENCOUNTER — Telehealth (HOSPITAL_COMMUNITY): Payer: Self-pay

## 2018-04-12 NOTE — Telephone Encounter (Signed)
Dr. Walden Field called patient to discuss recent PET scan. She explained to patient that there is a new place in her lungs that needs to be evaluated. Patient will be referred to cardiothoracic surgeon, Dr. Roxan Hockey. Patient verbalized understanding and requests appt be set up for after 6/28, when she returns from vacation. Message sent to scheduling.

## 2018-04-13 ENCOUNTER — Ambulatory Visit (HOSPITAL_COMMUNITY)
Admission: RE | Admit: 2018-04-13 | Discharge: 2018-04-13 | Disposition: A | Discharge status: Home / Self Care | Payer: Medicare Other | Source: Ambulatory Visit | Attending: Internal Medicine | Admitting: Internal Medicine

## 2018-04-13 DIAGNOSIS — C884 Extranodal marginal zone B-cell lymphoma of mucosa-associated lymphoid tissue [MALT-lymphoma]: Secondary | ICD-10-CM | POA: Insufficient documentation

## 2018-04-13 DIAGNOSIS — Z1231 Encounter for screening mammogram for malignant neoplasm of breast: Secondary | ICD-10-CM | POA: Insufficient documentation

## 2018-04-13 DIAGNOSIS — R918 Other nonspecific abnormal finding of lung field: Secondary | ICD-10-CM | POA: Insufficient documentation

## 2018-04-24 ENCOUNTER — Encounter: Payer: Medicare Other | Admitting: Thoracic Surgery (Cardiothoracic Vascular Surgery)

## 2018-05-02 ENCOUNTER — Ambulatory Visit (INDEPENDENT_AMBULATORY_CARE_PROVIDER_SITE_OTHER): Payer: Medicare Other | Admitting: Gastroenterology

## 2018-05-02 ENCOUNTER — Encounter: Payer: Self-pay | Admitting: *Deleted

## 2018-05-02 ENCOUNTER — Encounter: Payer: Self-pay | Admitting: Gastroenterology

## 2018-05-02 ENCOUNTER — Other Ambulatory Visit: Payer: Self-pay | Admitting: *Deleted

## 2018-05-02 VITALS — BP 138/72 | HR 89 | Temp 97.1°F | Ht 66.0 in | Wt 166.2 lb

## 2018-05-02 DIAGNOSIS — R1319 Other dysphagia: Secondary | ICD-10-CM

## 2018-05-02 DIAGNOSIS — C884 Extranodal marginal zone b-cell lymphoma of mucosa-associated lymphoid tissue (malt-lymphoma) not having achieved remission: Secondary | ICD-10-CM

## 2018-05-02 DIAGNOSIS — R1310 Dysphagia, unspecified: Secondary | ICD-10-CM | POA: Diagnosis not present

## 2018-05-02 DIAGNOSIS — Z1211 Encounter for screening for malignant neoplasm of colon: Secondary | ICD-10-CM

## 2018-05-02 DIAGNOSIS — R6881 Early satiety: Secondary | ICD-10-CM

## 2018-05-02 NOTE — Patient Instructions (Signed)
1. Upper endoscopy as scheduled. See separate instructions.  

## 2018-05-02 NOTE — Assessment & Plan Note (Signed)
Last colonoscopy was reported to be in 2009 per PCP, normal.  To discuss further with Dr. Gala Romney, may consider one last colonoscopy for screening purposes later this year.

## 2018-05-02 NOTE — Progress Notes (Signed)
Primary Care Physician:  Manon Hilding, MD  Primary Gastroenterologist:  Garfield Cornea, MD   Chief Complaint  Patient presents with  . MALToma    oncologist recommends EGD    HPI:  Monica [name] is a 77 y.o. female here for follow-up.  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  Treated with radiation from May to June 2017.Posttreatment EGD November 2017 revealed chronic gastritis but no evidence of lymphoid cells. H. pylori negative. She had another EGD in July 2018 for dysphagia. Schatzki ring dilated. Moderate to large hiatal hernia noted. No evidence of recurrent tumor. Fewlocalized erosions in the stomach but no specimens collected.  Patient presents to schedule surveillance EGD.  Per oncology, recommendations of yearly EGD for the first 5 years after diagnosis of MALT lymphoma.  Recent PET scan to follow-up on pulmonary nodule performed.  She had hypermetabolic solid 1.4 cm left lower lobe pulmonary nodule, new, suspicious for primary bronchogenic carcinoma.  She is awaiting appointment for consideration of biopsy.  She also had "Mild hypermetabolism in the antral region of the stomach without CT correlate, nonspecific, which could be due to peristalsis, inflammatory change or early recurrent tumor."  Patient is having some issues with early satiety and dysphagia to solids and liquids.  Sometimes when she drinks liquids it comes out her nose.  Solid food gets stuck in the chest just to wash it down.  Appetite okay.  No unintentional weight loss.  Feels up quickly when eating. No abd pain. No constipation, diarrhea, melena, brbpr.   Doxycycline for three months for UTI.  Colonoscopy 2009, reportedly normal per PCP.  Current Outpatient Medications  Medication Sig Dispense Refill  . Artificial Tear Solution (SOOTHE XP) SOLN Place 2 drops into both eyes daily as needed (dry eyes).    Marland Kitchen aspirin 81 MG tablet Take 81 mg by mouth at bedtime.     .  Calcium-Magnesium-Vitamin D (CALCIUM 500 PO) Take 500 mg by mouth at bedtime.     . citalopram (CELEXA) 20 MG tablet Take 20 mg by mouth daily.     Marland Kitchen doxycycline (VIBRAMYCIN) 100 MG capsule TAKE 1 CAPSULE BY MOUTH EVERY OTHER DAY BEFORE BED AND TAKE CALCIUM CITRATE ON OPPOSITE DAYS  5  . metFORMIN (GLUCOPHAGE-XR) 500 MG 24 hr tablet Take 500 mg 2 (two) times daily by mouth.     . metoprolol tartrate (LOPRESSOR) 25 MG tablet Take 25 mg by mouth at bedtime.     . pantoprazole (PROTONIX) 40 MG tablet Take 1 tablet (40 mg total) by mouth daily before breakfast. 90 tablet 3  . pravastatin (PRAVACHOL) 40 MG tablet Take 40 mg by mouth at bedtime.      No current facility-administered medications for this visit.     Allergies as of 05/02/2018 - Review Complete 05/02/2018  Allergen Reaction Noted  . Sulfonamide derivatives Hives   . Sulfur  06/04/2016    Past Medical History:  Diagnosis Date  . Cancer (HCC)    NHL, Malt Lymphoma  . Depression   . DM (diabetes mellitus) ([place])   . Dysphagia   . Dysrhythmia    History of palpatations  . Heart palpitations   . Hiatal hernia   . Hyperlipidemia   . Interstitial cystitis   . 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 Sinusitis     Past Surgical History:  Procedure Laterality Date  . BIOPSY  09/01/2016   Procedure:  BIOPSY;  Surgeon: Daneil Dolin, MD;  Location: [facility];  Service: Endoscopy;;  gastric  . COLONOSCOPY     Dr. Lindalou Hose 2009: Normal per PCP notes  . ESOPHAGOGASTRODUODENOSCOPY     RMR: Prominant Schzgzki ring/component of peptic stricture status post dilation and disruption as described above, otherwise norma esophagus, moderate-sized hiatal hernia, antal pyloric channel, and posterier bulbar erosions, otherwise unremarkable stomach, D1 and D2 . Inflammatory findings on the stomach and duodenum will likely be related to aspirin effect. We need to rule out Helicobacter pylor  . ESOPHAGOGASTRODUODENOSCOPY  N/A 03/10/2015   Dr. 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 Pathology with lymphoid population of stomach, slight atypia  . ESOPHAGOGASTRODUODENOSCOPY N/A 02/03/2016   Dr. Gala Romney: Schatzki ring noted at GE junction, dilated with 59 and then 9 Merom dilator. Large hiatal hernia. 6 x 7 cm nodular geographically ulcerated mucosa, biopsy c/w MALToma  . ESOPHAGOGASTRODUODENOSCOPY N/A 04/08/2016   Dr. Gala Romney: moderate Schatzki's ring/web s/p dilation, large hiatal hernia, localized area of gastric lymphoma visualized and appeared to be much improved. normal second portion of the duodenum. No specimens collected. Esophageal lumen notably tighted up significantly since her dilation in April of this year.   . ESOPHAGOGASTRODUODENOSCOPY N/A 08/04/2016   Procedure: ESOPHAGOGASTRODUODENOSCOPY (EGD);  Surgeon: Daneil Dolin, MD;  [facility]: [facility];  Service: Endoscopy;  Laterality: N/A;  7:30 am  . ESOPHAGOGASTRODUODENOSCOPY N/A 09/01/2016   Procedure: ESOPHAGOGASTRODUODENOSCOPY (EGD);  Surgeon: Daneil Dolin, MD;  [facility]: [facility];  Service: Endoscopy;  Laterality: N/A;  830   . ESOPHAGOGASTRODUODENOSCOPY N/A 05/10/2017   Dr. Gala Romney: Moderate Schatzki ring at the GE junction, status post dilation with 32 [place].  Medium sized hiatal hernia.  Few localized erosions in the gastric antrum.  Stomach biopsy showed chronic gastritis, no H. pylori.  No atypical lymphoid infiltrates or other features of lymphoproliferative process  . MALONEY DILATION N/A 03/10/2015   Procedure: Venia Minks DILATION;  Surgeon: Daneil Dolin, MD;  [facility]: [facility];  Service: Endoscopy;  Laterality: N/A;  . Venia Minks DILATION N/A 02/03/2016   Procedure: Venia Minks DILATION;  Surgeon: Daneil Dolin, MD;  [facility]: [facility];  Service: Endoscopy;  Laterality: N/A;  . Venia Minks DILATION N/A 04/08/2016   Procedure: Venia Minks DILATION;  Surgeon: Daneil Dolin, MD;   [facility]: [facility];  Service: Endoscopy;  Laterality: N/A;  . Venia Minks DILATION N/A 05/10/2017   Procedure: Venia Minks DILATION;  Surgeon: Daneil Dolin, MD;  [facility]: [facility];  Service: Endoscopy;  Laterality: N/A;    Family History  Problem Relation Age of Onset  . Heart attack Father   . Dementia Father   . Diabetes Sister   . Diabetes Brother   . Diabetes Sister   . Colon cancer Neg Hx     Social History   Socioeconomic History  . Marital status: Married    Spouse name: Not on file  . Number of children: Not on file  . Years of education: Not on file  . Highest education level: Not on file  Occupational History  . Not on file  Social Needs  . Financial resource strain: Not on file  . Food insecurity:    Worry: Not on file    Inability: Not on file  . Transportation needs:    Medical: Not on file    Non-medical: Not on file  Tobacco Use  . Smoking status: Current Every Day Smoker  Packs/day: 0.75    Years: 57.00    Pack years: 42.75    Types: Cigarettes  . Smokeless tobacco: Never Used  . Tobacco comment: trying to quit, wearing patch  Substance and Sexual Activity  . Alcohol use: No    Alcohol/week: 0.0 oz  . Drug use: No  . Sexual activity: Not on file  Lifestyle  . Physical activity:    Days per week: Not on file    Minutes per session: Not on file  . Stress: Not on file  Relationships  . Social connections:    Talks on phone: Not on file    Gets together: Not on file    Attends religious service: Not on file    Active member of club or organization: Not on file    Attends meetings of clubs or organizations: Not on file    Relationship status: Not on file  . Intimate partner violence:    Fear of current or ex partner: Not on file    Emotionally abused: Not on file    Physically abused: Not on file    Forced sexual activity: Not on file  Other Topics Concern  . Not on file  Social History Narrative  . Not on file       ROS:  General: Negative for anorexia, weight loss, fever, chills, fatigue, weakness. Eyes: Negative for vision changes.  ENT: Negative for hoarseness,   nasal congestion. See hpi CV: Negative for chest pain, angina, palpitations, dyspnea on exertion, peripheral edema.  Respiratory: Negative for dyspnea at rest, dyspnea on exertion, cough, wheezing. +sputum GI: See history of present illness. GU:  Negative for dysuria, hematuria, urinary incontinence, urinary frequency, nocturnal urination.  MS: Negative for joint pain, low back pain.  Derm: Negative for rash or itching.  Neuro: Negative for weakness, abnormal sensation, seizure, frequent headaches, memory loss, confusion.  Psych: Negative for anxiety, depression, suicidal ideation, hallucinations.  Endo: Negative for unusual weight change.  Heme: Negative for bruising or bleeding. Allergy: Negative for rash or hives.    Physical Examination:  BP 138/72   Pulse 89   Temp (!) 97.1 F (36.2 C) (Oral)   Ht 5\' 6"  (1.676 m)   Wt 166 lb 3.2 oz (75.4 kg)   BMI 26.83 kg/m    General: Well-nourished, well-developed in no acute distress.  Head: Normocephalic, atraumatic.   Eyes: Conjunctiva pink, no icterus. Mouth: Oropharyngeal mucosa moist and pink , no lesions erythema or exudate. Neck: Supple without thyromegaly, masses, or lymphadenopathy.  Lungs: Clear to auscultation bilaterally.  Heart: Regular rate and rhythm, no murmurs rubs or gallops.  Abdomen: Bowel sounds are normal, nontender, nondistended, no hepatosplenomegaly or masses, no abdominal bruits or    hernia , no rebound or guarding.   Rectal: not performed Extremities: No lower extremity edema. No clubbing or deformities.  Neuro: Alert and oriented x 4 , grossly normal neurologically.  Skin: Warm and dry, no rash or jaundice.   Psych: Alert and cooperative, normal mood and affect.  Labs: Lab Results  Component Value Date   CREATININE 0.73 03/30/2018   BUN 12  03/30/2018   NA 139 03/30/2018   K 4.9 03/30/2018   CL 102 03/30/2018   CO2 27 03/30/2018   Lab Results  Component Value Date   WBC 7.8 03/30/2018   HGB 12.9 03/30/2018   HCT 39.0 03/30/2018   MCV 88.4 03/30/2018   PLT 254 03/30/2018   Lab Results  Component Value Date  ALT 17 03/30/2018   AST 23 03/30/2018   ALKPHOS 54 03/30/2018   BILITOT 0.7 03/30/2018     Imaging Studies: Nm Pet Image Restag (ps) Skull Base To Thigh  Result Date: 04/11/2018 CLINICAL DATA:  Subsequent treatment strategy for gastric MALT lymphoma. EXAM: NUCLEAR MEDICINE PET SKULL BASE TO THIGH TECHNIQUE: 11.9 mCi F-18 FDG was injected intravenously. Full-ring PET imaging was performed from the skull base to thigh after the radiotracer. CT data was obtained and used for attenuation correction and anatomic localization. Fasting blood glucose: 104 mg/dl COMPARISON:  03/03/2016 PET-CT. 09/19/2017 CT abdomen/pelvis. 03/02/2017 chest CT. FINDINGS: Mediastinal blood pool activity: SUV max 2.4 NECK: No hypermetabolic lymph nodes in the neck. Incidental CT findings: none CHEST: Hypermetabolic solid 1.4 cm left lower lobe pulmonary nodule with max SUV 4.6 (series 3/image 118), new since 03/03/2016 PET-CT and new since 03/02/2017 chest CT. No additional hypermetabolic pulmonary findings. No hypermetabolic hilar, mediastinal or axillary lymphadenopathy. Incidental CT findings: Three-vessel coronary atherosclerosis. Atherosclerotic nonaneurysmal thoracic aorta. Moderate to severe centrilobular emphysema with diffuse bronchial wall thickening. Scattered subpleural solid left lower lobe pulmonary nodules measuring up to 5 mm peripherally (series 3/image 123) are stable since 03/02/2017 chest CT and considered benign. No acute consolidative airspace disease or additional new significant pulmonary nodules. Compressive atelectasis in the medial left lower lobe. ABDOMEN/PELVIS: No abnormal hypermetabolic activity within the liver,  pancreas, adrenal glands, or spleen. No hypermetabolic lymph nodes in the abdomen or pelvis. There is mild hypermetabolism (max SUV 4.1) in the posterior antral region of the stomach without discrete gastric mass or definite gastric wall thickening on CT images. Incidental CT findings: Simple 3.4 cm lower right renal cyst. Atherosclerotic nonaneurysmal abdominal aorta. Moderate to large hiatal hernia. Marked sigmoid diverticulosis. Hysterectomy. SKELETON: No focal hypermetabolic activity to suggest skeletal metastasis. Incidental CT findings: none IMPRESSION: 1. Hypermetabolic solid 1.4 cm left lower lobe pulmonary nodule, new, suspicious for primary bronchogenic carcinoma given the advanced smoking related changes in the lungs. 2. No hypermetabolic thoracic adenopathy or distant metastatic disease. 3. Mild hypermetabolism in the antral region of the stomach without CT correlate, nonspecific, which could be due to peristalsis, inflammatory change or early recurrent tumor. 4. Chronic findings include: Aortic Atherosclerosis (ICD10-I70.0) and Emphysema (ICD10-J43.9). Marked sigmoid diverticulosis. Moderate to large hiatal hernia. Three-vessel coronary atherosclerosis. Electronically Signed   By: Ilona Sorrel M.D.   On: 04/11/2018 07:45   Mm Digital Screening  Result Date: 04/14/2018 CLINICAL DATA:  Screening. EXAM: DIGITAL SCREENING BILATERAL MAMMOGRAM WITH CAD COMPARISON:  Previous exam(s). ACR Breast Density Category b: There are scattered areas of fibroglandular density. FINDINGS: There are no findings suspicious for malignancy. Images were processed with CAD. IMPRESSION: No mammographic evidence of malignancy. A result letter of this screening mammogram will be mailed directly to the patient. RECOMMENDATION: Screening mammogram in one year. (Code:SM-B-01Y) BI-RADS CATEGORY  1: Negative. Electronically Signed   By: Ammie Ferrier M.D.   On: 04/14/2018 07:49

## 2018-05-02 NOTE — Assessment & Plan Note (Signed)
77 year old lady with history of stage I gastric MALT lymphoma status post radiation in early 2017 who presents to schedule surveillance EGD based on previous oncology recommendations.  She is also having early satiety, solid and liquid esophageal dysphagia.  Has a history of previous Schatzki ring status post dilation as well as moderate sized hiatal hernia which is likely contributing to her symptoms.  Plan on EGD with dilation in the near future.  I have discussed the risks, alternatives, benefits with regards to but not limited to the risk of reaction to medication, bleeding, infection, perforation and the patient is agreeable to proceed. Written consent to be obtained.  I have discussed importance of making sure her medications do not get stuck in her esophagus especially the doxycycline.  She has been advised to use plenty of liquids when taking her medications and sit upright at least 30 minutes afterwards.

## 2018-05-02 NOTE — Progress Notes (Signed)
cc'ed to pcp °

## 2018-05-08 ENCOUNTER — Institutional Professional Consult (permissible substitution) (INDEPENDENT_AMBULATORY_CARE_PROVIDER_SITE_OTHER): Payer: Medicare Other | Admitting: Thoracic Surgery (Cardiothoracic Vascular Surgery)

## 2018-05-08 ENCOUNTER — Other Ambulatory Visit: Payer: Self-pay

## 2018-05-08 ENCOUNTER — Encounter: Payer: Self-pay | Admitting: Thoracic Surgery (Cardiothoracic Vascular Surgery)

## 2018-05-08 ENCOUNTER — Other Ambulatory Visit: Payer: Self-pay | Admitting: *Deleted

## 2018-05-08 VITALS — BP 156/84 | HR 74 | Resp 16 | Ht 66.0 in | Wt 166.0 lb

## 2018-05-08 DIAGNOSIS — R911 Solitary pulmonary nodule: Secondary | ICD-10-CM

## 2018-05-08 DIAGNOSIS — D381 Neoplasm of uncertain behavior of trachea, bronchus and lung: Secondary | ICD-10-CM

## 2018-05-08 NOTE — H&P (View-Only) (Signed)
PCP is Sasser, Silvestre Moment, MD Referring Provider is Higgs, Mathis Dad, MD  Chief Complaint  Patient presents with  . Lung Lesion    LLLobe...PET 04/10/18    HPI: Monica [name] sent for consultation regarding a left lower lobe lung nodule.  Monica [name] is a 77 year old woman with a 50+-pack-year history of tobacco abuse, type 2 diabetes, gastric MALT lymphoma, hiatal hernia, Schatzki's ring, depression, history of palpitations, and interstitial cystitis.  She has been smoking since age 28.  She has smoked a pack a day the majority of the time.  She continues to smoke.  Was diagnosed with a gastric MALT lymphoma 2016.  She was treated with radiation.  She recently saw Dr. Walden Field for follow-up.  A PET/CT was done for follow-up.  It showed some activity but no mass in the gastric antrum.  It also showed a new 1.3 cm left lower lobe nodule that was markedly hypermetabolic SUV of 4.6.  There is no mediastinal or hilar adenopathy.  She is better her usual state of health.  She has not had any change in appetite or weight loss.  She has had difficulty swallowing.  She recently saw GI and is scheduled for an esophageal dilatation in August.  She gets short of breath with heavy activity but can walk up a flight of stairs without difficulty.  She denies chest pain, pressure, or tightness.  She has not had any unusual headaches or visual changes. Zubrod Score: At the time of surgery this patient's most appropriate activity status/level should be described as: []     0    Normal activity, no symptoms [x]     1    Restricted in physical strenuous activity but ambulatory, able to do out light work []     2    Ambulatory and capable of self care, unable to do work activities, up and about >50 % of waking hours                              []     3    Only limited self care, in bed greater than 50% of waking hours []     4    Completely disabled, no self care, confined to bed or chair []     5    Moribund   Past Medical History:   Diagnosis Date  . Cancer (HCC)    NHL, Malt Lymphoma  . Depression   . DM (diabetes mellitus) ([place])   . Dysphagia   . Dysrhythmia    History of palpatations  . Heart palpitations   . Hiatal hernia   . Hyperlipidemia   . Interstitial cystitis   . 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 Sinusitis     Past Surgical History:  Procedure Laterality Date  . BIOPSY  09/01/2016   Procedure: BIOPSY;  Surgeon: Daneil Dolin, MD;  Location: [facility];  Service: Endoscopy;;  gastric  . COLONOSCOPY     Dr. Lindalou Hose 2009: Normal per PCP notes  . ESOPHAGOGASTRODUODENOSCOPY     RMR: Prominant Schzgzki ring/component of peptic stricture status post dilation and disruption as described above, otherwise norma esophagus, moderate-sized hiatal hernia, antal pyloric channel, and posterier bulbar erosions, otherwise unremarkable stomach, D1 and D2 . Inflammatory findings on the stomach and duodenum will likely be related to aspirin effect. We need to rule out Helicobacter pylor  . ESOPHAGOGASTRODUODENOSCOPY N/A 03/10/2015   Dr.  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 Pathology with lymphoid population of stomach, slight atypia  . ESOPHAGOGASTRODUODENOSCOPY N/A 02/03/2016   Dr. Gala Romney: Schatzki ring noted at GE junction, dilated with 84 and then 59 Hudson dilator. Large hiatal hernia. 6 x 7 cm nodular geographically ulcerated mucosa, biopsy c/w MALToma  . ESOPHAGOGASTRODUODENOSCOPY N/A 04/08/2016   Dr. Gala Romney: moderate Schatzki's ring/web s/p dilation, large hiatal hernia, localized area of gastric lymphoma visualized and appeared to be much improved. normal second portion of the duodenum. No specimens collected. Esophageal lumen notably tighted up significantly since her dilation in April of this year.   . ESOPHAGOGASTRODUODENOSCOPY N/A 08/04/2016   Procedure: ESOPHAGOGASTRODUODENOSCOPY (EGD);  Surgeon: Daneil Dolin, MD;  Location: [facility];  Service: Endoscopy;  Laterality: N/A;  7:30 am  . ESOPHAGOGASTRODUODENOSCOPY N/A 09/01/2016   Procedure: ESOPHAGOGASTRODUODENOSCOPY (EGD);  Surgeon: Daneil Dolin, MD;  Location: [facility];  Service: Endoscopy;  Laterality: N/A;  830   . ESOPHAGOGASTRODUODENOSCOPY N/A 05/10/2017   Dr. Gala Romney: Moderate Schatzki ring at the GE junction, status post dilation with 26 [place].  Medium sized hiatal hernia.  Few localized erosions in the gastric antrum.  Stomach biopsy showed chronic gastritis, no H. pylori.  No atypical lymphoid infiltrates or other features of lymphoproliferative process  . MALONEY DILATION N/A 03/10/2015   Procedure: Venia Minks DILATION;  Surgeon: Daneil Dolin, MD;  Location: [facility];  Service: Endoscopy;  Laterality: N/A;  . Venia Minks DILATION N/A 02/03/2016   Procedure: Venia Minks DILATION;  Surgeon: Daneil Dolin, MD;  Location: [facility];  Service: Endoscopy;  Laterality: N/A;  . Venia Minks DILATION N/A 04/08/2016   Procedure: Venia Minks DILATION;  Surgeon: Daneil Dolin, MD;  Location: [facility];  Service: Endoscopy;  Laterality: N/A;  . Venia Minks DILATION N/A 05/10/2017   Procedure: Venia Minks DILATION;  Surgeon: Daneil Dolin, MD;  Location: [facility];  Service: Endoscopy;  Laterality: N/A;    Family History  Problem Relation Age of Onset  . Heart attack Father   . Dementia Father   . Diabetes Sister   . Diabetes Brother   . Diabetes Sister   . Colon cancer Neg Hx     Social History Social History   Tobacco Use  . Smoking status: Current Every Day Smoker    Packs/day: 0.75    Years: 57.00    Pack years: 42.75    Types: Cigarettes  . Smokeless tobacco: Never Used  . Tobacco comment: trying to quit, wearing patch  Substance Use Topics  . Alcohol use: No    Alcohol/week: 0.0 oz  . Drug use: No    Current Outpatient Medications  Medication Sig Dispense Refill  . Artificial Tear Solution (SOOTHE XP) SOLN Place 2 drops  into both eyes daily as needed (dry eyes).    Marland Kitchen aspirin 81 MG tablet Take 81 mg by mouth at bedtime.     . Calcium-Magnesium-Vitamin D (CALCIUM 500 PO) Take 500 mg by mouth at bedtime.     . citalopram (CELEXA) 20 MG tablet Take 20 mg by mouth daily.     Marland Kitchen doxycycline (VIBRAMYCIN) 100 MG capsule TAKE 1 CAPSULE BY MOUTH EVERY OTHER DAY BEFORE BED AND TAKE CALCIUM CITRATE ON OPPOSITE DAYS  5  . metFORMIN (GLUCOPHAGE-XR) 500 MG 24 hr tablet Take 500 mg 2 (two) times daily by mouth.     . metoprolol tartrate (LOPRESSOR) 25 MG tablet Take 25 mg  by mouth at bedtime.     . pantoprazole (PROTONIX) 40 MG tablet Take 1 tablet (40 mg total) by mouth daily before breakfast. 90 tablet 3  . pravastatin (PRAVACHOL) 40 MG tablet Take 40 mg by mouth at bedtime.      No current facility-administered medications for this visit.     Allergies  Allergen Reactions  . Sulfonamide Derivatives Hives  . Sulfur     Review of Systems  Constitutional: Negative for activity change, appetite change, fatigue and unexpected weight change.  HENT: Positive for trouble swallowing (Scheduled for dilatation in August). Negative for dental problem and voice change.   Eyes: Negative for visual disturbance.  Respiratory: Positive for cough (Clear sputum). Negative for chest tightness, shortness of breath and wheezing.   Cardiovascular: Negative for chest pain, palpitations and leg swelling.  Gastrointestinal: Positive for abdominal distention and abdominal pain.  Genitourinary: Positive for frequency. Negative for difficulty urinating and dysuria.       History of urinary tract infections  Musculoskeletal: Negative for arthralgias and myalgias.  Neurological: Negative for seizures, speech difficulty and weakness.  Hematological: Negative for adenopathy. Bruises/bleeds easily.  Psychiatric/Behavioral: The patient is nervous/anxious.        Remote history of panic attack  All other systems reviewed and are negative.   BP  (!) 156/84 (BP Location: Right Arm, Patient Position: Sitting, Cuff Size: Normal)   Pulse 74   Resp 16   Ht 5\' 6"  (1.676 m)   Wt 166 lb (75.3 kg)   SpO2 97% Comment: ON RA  BMI 26.79 kg/m  Physical Exam  Constitutional: She is oriented to person, place, and time. She appears well-developed and well-nourished. No distress.  HENT:  Head: Normocephalic and atraumatic.  Mouth/Throat: No oropharyngeal exudate.  Eyes: Conjunctivae and EOM are normal. No scleral icterus.  Neck: Neck supple. No thyromegaly present.  Cardiovascular: Normal rate, regular rhythm, normal heart sounds and intact distal pulses. Exam reveals no gallop and no friction rub.  No murmur heard. Pulmonary/Chest: Effort normal and breath sounds normal. No stridor. No respiratory distress. She has no wheezes.  Abdominal: Soft. She exhibits no distension.  Musculoskeletal: She exhibits no edema or deformity.  Lymphadenopathy:    She has no cervical adenopathy.  Neurological: She is alert and oriented to person, place, and time. No cranial nerve deficit. She exhibits normal muscle tone. Coordination normal.  Skin: Skin is warm and dry.  Vitals reviewed.    Diagnostic Tests: NUCLEAR MEDICINE PET SKULL BASE TO THIGH  TECHNIQUE: 11.9 mCi F-18 FDG was injected intravenously. Full-ring PET imaging was performed from the skull base to thigh after the radiotracer. CT data was obtained and used for attenuation correction and anatomic localization.  Fasting blood glucose: 104 mg/dl  COMPARISON:  03/03/2016 PET-CT. 09/19/2017 CT abdomen/pelvis. 03/02/2017 chest CT.  FINDINGS: Mediastinal blood pool activity: SUV max 2.4  NECK: No hypermetabolic lymph nodes in the neck.  Incidental CT findings: none  CHEST:  Hypermetabolic solid 1.4 cm left lower lobe pulmonary nodule with max SUV 4.6 (series 3/image 118), new since 03/03/2016 PET-CT and new since 03/02/2017 chest CT. No additional hypermetabolic pulmonary  findings. No hypermetabolic hilar, mediastinal or axillary lymphadenopathy.  Incidental CT findings: Three-vessel coronary atherosclerosis. Atherosclerotic nonaneurysmal thoracic aorta. Moderate to severe centrilobular emphysema with diffuse bronchial wall thickening. Scattered subpleural solid left lower lobe pulmonary nodules measuring up to 5 mm peripherally (series 3/image 123) are stable since 03/02/2017 chest CT and considered benign. No acute consolidative airspace disease  or additional new significant pulmonary nodules. Compressive atelectasis in the medial left lower lobe.  ABDOMEN/PELVIS: No abnormal hypermetabolic activity within the liver, pancreas, adrenal glands, or spleen. No hypermetabolic lymph nodes in the abdomen or pelvis.  There is mild hypermetabolism (max SUV 4.1) in the posterior antral region of the stomach without discrete gastric mass or definite gastric wall thickening on CT images.  Incidental CT findings: Simple 3.4 cm lower right renal cyst. Atherosclerotic nonaneurysmal abdominal aorta. Moderate to large hiatal hernia. Marked sigmoid diverticulosis. Hysterectomy.  SKELETON: No focal hypermetabolic activity to suggest skeletal metastasis.  Incidental CT findings: none  IMPRESSION: 1. Hypermetabolic solid 1.4 cm left lower lobe pulmonary nodule, new, suspicious for primary bronchogenic carcinoma given the advanced smoking related changes in the lungs. 2. No hypermetabolic thoracic adenopathy or distant metastatic disease. 3. Mild hypermetabolism in the antral region of the stomach without CT correlate, nonspecific, which could be due to peristalsis, inflammatory change or early recurrent tumor. 4. Chronic findings include: Aortic Atherosclerosis (ICD10-I70.0) and Emphysema (ICD10-J43.9). Marked sigmoid diverticulosis. Moderate to large hiatal hernia. Three-vessel coronary atherosclerosis.   Electronically Signed   By: Ilona Sorrel  M.D.   On: 04/11/2018 07:45 I personally reviewed the PET CT images and concur with the findings noted above  Impression: Monica [name] is a 77 year old woman with a long-standing history of tobacco abuse, history of a gastric MALT lymphoma treated with radiation in 2016, type 2 diabetes without complication, hyperlipidemia, anxiety and depression.  She recently had a PET/CT for follow-up of her MALT lymphoma.  She was found to have a new 1.4 cm lung nodule that was markedly hypermetabolic.  This is highly suspicious for a new primary bronchogenic carcinoma.  Other items in the differential include a second MALT lymphoma, carcinoid tumors, infectious and inflammatory nodules.  Given her long-standing smoking history and the degree of hypermetabolism for the size of the nodule, this has to be considered a new primary bronchogenic carcinoma unless we can prove otherwise.  I discussed potential options for diagnosis and treatment.  My recommendation was that we proceed with left VATS for wedge resection for definitive diagnosis.  We would do a left lower lobectomy at the same setting if this turned out to be a primary bronchogenic carcinoma on frozen section.  We also discussed the options of bronchoscopic or CT-guided biopsy and radiation therapy.  I described the general nature of the operation to her including the need for general anesthesia, the incisions to be used, the intraoperative decision making, use of a drainage tube postoperatively, the expected hospital stay, and the overall recovery.  I informed him of the indications, risk, benefits, and alternatives.  They understand the risks include, but not limited to death, MI, DVT, PE, bleeding, possible need for transfusion, infection, prolonged air leak, cardiac arrhythmias, as well as possibility of other unforeseeable complications.  She understands and accepts the risks and wishes to proceed.  She needs full pulmonary function testing with and  without bronchial dilators prior to surgery.  Coronary atherosclerosis on CT-no symptoms suggestive of angina.  No indication for further cardiac work-up at this time.  Dysphagia-scheduled for esophageal dilatation in August.  Plan: Pulmonary function testing with and without bronchodilators Left VATS, wedge resection, possible left lower lobectomy on Friday, 05/19/2018  [name], MD [facility] [phone]

## 2018-05-08 NOTE — Progress Notes (Signed)
PCP is Sasser, Silvestre Moment, MD Referring Provider is Higgs, Mathis Dad, MD  Chief Complaint  Patient presents with  . Lung Lesion    LLLobe...PET 04/10/18    HPI: Mrs. Burchfield sent for consultation regarding a left lower lobe lung nodule.  Monica [name] is a 77 year old woman with a 50+-pack-year history of tobacco abuse, type 2 diabetes, gastric MALT lymphoma, hiatal hernia, Schatzki's ring, depression, history of palpitations, and interstitial cystitis.  She has been smoking since age 80.  She has smoked a pack a day the majority of the time.  She continues to smoke.  Was diagnosed with a gastric MALT lymphoma 2016.  She was treated with radiation.  She recently saw Dr. Walden Field for follow-up.  A PET/CT was done for follow-up.  It showed some activity but no mass in the gastric antrum.  It also showed a new 1.3 cm left lower lobe nodule that was markedly hypermetabolic SUV of 4.6.  There is no mediastinal or hilar adenopathy.  She is better her usual state of health.  She has not had any change in appetite or weight loss.  She has had difficulty swallowing.  She recently saw GI and is scheduled for an esophageal dilatation in August.  She gets short of breath with heavy activity but can walk up a flight of stairs without difficulty.  She denies chest pain, pressure, or tightness.  She has not had any unusual headaches or visual changes. Zubrod Score: At the time of surgery this patient's most appropriate activity status/level should be described as: []     0    Normal activity, no symptoms [x]     1    Restricted in physical strenuous activity but ambulatory, able to do out light work []     2    Ambulatory and capable of self care, unable to do work activities, up and about >50 % of waking hours                              []     3    Only limited self care, in bed greater than 50% of waking hours []     4    Completely disabled, no self care, confined to bed or chair []     5    Moribund   Past Medical History:   Diagnosis Date  . Cancer (HCC)    NHL, Malt Lymphoma  . Depression   . DM (diabetes mellitus) ([facility])   . Dysphagia   . Dysrhythmia    History of palpatations  . Heart palpitations   . Hiatal hernia   . Hyperlipidemia   . Interstitial cystitis   . 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 Sinusitis     Past Surgical History:  Procedure Laterality Date  . BIOPSY  09/01/2016   Procedure: BIOPSY;  Surgeon: Daneil Dolin, MD;  [facility];  Service: Endoscopy;;  gastric  . COLONOSCOPY     Dr. Lindalou Hose 2009: Normal per PCP notes  . ESOPHAGOGASTRODUODENOSCOPY     RMR: Prominant Schzgzki ring/component of peptic stricture status post dilation and disruption as described above, otherwise norma esophagus, moderate-sized hiatal hernia, antal pyloric channel, and posterier bulbar erosions, otherwise unremarkable stomach, D1 and D2 . Inflammatory findings on the stomach and duodenum will likely be related to aspirin effect. We need to rule out Helicobacter pylor  . ESOPHAGOGASTRODUODENOSCOPY N/A 03/10/2015   Dr.  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 Pathology with lymphoid population of stomach, slight atypia  . ESOPHAGOGASTRODUODENOSCOPY N/A 02/03/2016   Dr. Gala Romney: Schatzki ring noted at GE junction, dilated with 61 and then 82 [place] dilator. Large hiatal hernia. 6 x 7 cm nodular geographically ulcerated mucosa, biopsy c/w MALToma  . ESOPHAGOGASTRODUODENOSCOPY N/A 04/08/2016   Dr. Gala Romney: moderate Schatzki's ring/web s/p dilation, large hiatal hernia, localized area of gastric lymphoma visualized and appeared to be much improved. normal second portion of the duodenum. No specimens collected. Esophageal lumen notably tighted up significantly since her dilation in April of this year.   . ESOPHAGOGASTRODUODENOSCOPY N/A 08/04/2016   Procedure: ESOPHAGOGASTRODUODENOSCOPY (EGD);  Surgeon: Daneil Dolin, MD;  [facility];  Service: Endoscopy;  Laterality: N/A;  7:30 am  . ESOPHAGOGASTRODUODENOSCOPY N/A 09/01/2016   Procedure: ESOPHAGOGASTRODUODENOSCOPY (EGD);  Surgeon: Daneil Dolin, MD;  [facility];  Service: Endoscopy;  Laterality: N/A;  830   . ESOPHAGOGASTRODUODENOSCOPY N/A 05/10/2017   Dr. Gala Romney: Moderate Schatzki ring at the GE junction, status post dilation with 57 [place].  Medium sized hiatal hernia.  Few localized erosions in the gastric antrum.  Stomach biopsy showed chronic gastritis, no H. pylori.  No atypical lymphoid infiltrates or other features of lymphoproliferative process  . MALONEY DILATION N/A 03/10/2015   Procedure: Venia Minks DILATION;  Surgeon: Daneil Dolin, MD;  [facility];  Service: Endoscopy;  Laterality: N/A;  . Venia Minks DILATION N/A 02/03/2016   Procedure: Venia Minks DILATION;  Surgeon: Daneil Dolin, MD;  [facility];  Service: Endoscopy;  Laterality: N/A;  . Venia Minks DILATION N/A 04/08/2016   Procedure: Venia Minks DILATION;  Surgeon: Daneil Dolin, MD;  [facility];  Service: Endoscopy;  Laterality: N/A;  . Venia Minks DILATION N/A 05/10/2017   Procedure: Venia Minks DILATION;  Surgeon: Daneil Dolin, MD;  [facility];  Service: Endoscopy;  Laterality: N/A;    Family History  Problem Relation Age of Onset  . Heart attack Father   . Dementia Father   . Diabetes Sister   . Diabetes Brother   . Diabetes Sister   . Colon cancer Neg Hx     Social History Social History   Tobacco Use  . Smoking status: Current Every Day Smoker    Packs/day: 0.75    Years: 57.00    Pack years: 42.75    Types: Cigarettes  . Smokeless tobacco: Never Used  . Tobacco comment: trying to quit, wearing patch  Substance Use Topics  . Alcohol use: No    Alcohol/week: 0.0 oz  . Drug use: No    Current Outpatient Medications  Medication Sig Dispense Refill  . Artificial Tear Solution (SOOTHE XP) SOLN Place 2 drops  into both eyes daily as needed (dry eyes).    Marland Kitchen aspirin 81 MG tablet Take 81 mg by mouth at bedtime.     . Calcium-Magnesium-Vitamin D (CALCIUM 500 PO) Take 500 mg by mouth at bedtime.     . citalopram (CELEXA) 20 MG tablet Take 20 mg by mouth daily.     Marland Kitchen doxycycline (VIBRAMYCIN) 100 MG capsule TAKE 1 CAPSULE BY MOUTH EVERY OTHER DAY BEFORE BED AND TAKE CALCIUM CITRATE ON OPPOSITE DAYS  5  . metFORMIN (GLUCOPHAGE-XR) 500 MG 24 hr tablet Take 500 mg 2 (two) times daily by mouth.     . metoprolol tartrate (LOPRESSOR) 25 MG tablet Take 25 mg  by mouth at bedtime.     . pantoprazole (PROTONIX) 40 MG tablet Take 1 tablet (40 mg total) by mouth daily before breakfast. 90 tablet 3  . pravastatin (PRAVACHOL) 40 MG tablet Take 40 mg by mouth at bedtime.      No current facility-administered medications for this visit.     Allergies  Allergen Reactions  . Sulfonamide Derivatives Hives  . Sulfur     Review of Systems  Constitutional: Negative for activity change, appetite change, fatigue and unexpected weight change.  HENT: Positive for trouble swallowing (Scheduled for dilatation in August). Negative for dental problem and voice change.   Eyes: Negative for visual disturbance.  Respiratory: Positive for cough (Clear sputum). Negative for chest tightness, shortness of breath and wheezing.   Cardiovascular: Negative for chest pain, palpitations and leg swelling.  Gastrointestinal: Positive for abdominal distention and abdominal pain.  Genitourinary: Positive for frequency. Negative for difficulty urinating and dysuria.       History of urinary tract infections  Musculoskeletal: Negative for arthralgias and myalgias.  Neurological: Negative for seizures, speech difficulty and weakness.  Hematological: Negative for adenopathy. Bruises/bleeds easily.  Psychiatric/Behavioral: The patient is nervous/anxious.        Remote history of panic attack  All other systems reviewed and are negative.   BP  (!) 156/84 (BP Location: Right Arm, Patient Position: Sitting, Cuff Size: Normal)   Pulse 74   Resp 16   Ht 5\' 6"  (1.676 m)   Wt 166 lb (75.3 kg)   SpO2 97% Comment: ON RA  BMI 26.79 kg/m  Physical Exam  Constitutional: She is oriented to person, place, and time. She appears well-developed and well-nourished. No distress.  HENT:  Head: Normocephalic and atraumatic.  Mouth/Throat: No oropharyngeal exudate.  Eyes: Conjunctivae and EOM are normal. No scleral icterus.  Neck: Neck supple. No thyromegaly present.  Cardiovascular: Normal rate, regular rhythm, normal heart sounds and intact distal pulses. Exam reveals no gallop and no friction rub.  No murmur heard. Pulmonary/Chest: Effort normal and breath sounds normal. No stridor. No respiratory distress. She has no wheezes.  Abdominal: Soft. She exhibits no distension.  Musculoskeletal: She exhibits no edema or deformity.  Lymphadenopathy:    She has no cervical adenopathy.  Neurological: She is alert and oriented to person, place, and time. No cranial nerve deficit. She exhibits normal muscle tone. Coordination normal.  Skin: Skin is warm and dry.  Vitals reviewed.    Diagnostic Tests: NUCLEAR MEDICINE PET SKULL BASE TO THIGH  TECHNIQUE: 11.9 mCi F-18 FDG was injected intravenously. Full-ring PET imaging was performed from the skull base to thigh after the radiotracer. CT data was obtained and used for attenuation correction and anatomic localization.  Fasting blood glucose: 104 mg/dl  COMPARISON:  03/03/2016 PET-CT. 09/19/2017 CT abdomen/pelvis. 03/02/2017 chest CT.  FINDINGS: Mediastinal blood pool activity: SUV max 2.4  NECK: No hypermetabolic lymph nodes in the neck.  Incidental CT findings: none  CHEST:  Hypermetabolic solid 1.4 cm left lower lobe pulmonary nodule with max SUV 4.6 (series 3/image 118), new since 03/03/2016 PET-CT and new since 03/02/2017 chest CT. No additional hypermetabolic pulmonary  findings. No hypermetabolic hilar, mediastinal or axillary lymphadenopathy.  Incidental CT findings: Three-vessel coronary atherosclerosis. Atherosclerotic nonaneurysmal thoracic aorta. Moderate to severe centrilobular emphysema with diffuse bronchial wall thickening. Scattered subpleural solid left lower lobe pulmonary nodules measuring up to 5 mm peripherally (series 3/image 123) are stable since 03/02/2017 chest CT and considered benign. No acute consolidative airspace disease  or additional new significant pulmonary nodules. Compressive atelectasis in the medial left lower lobe.  ABDOMEN/PELVIS: No abnormal hypermetabolic activity within the liver, pancreas, adrenal glands, or spleen. No hypermetabolic lymph nodes in the abdomen or pelvis.  There is mild hypermetabolism (max SUV 4.1) in the posterior antral region of the stomach without discrete gastric mass or definite gastric wall thickening on CT images.  Incidental CT findings: Simple 3.4 cm lower right renal cyst. Atherosclerotic nonaneurysmal abdominal aorta. Moderate to large hiatal hernia. Marked sigmoid diverticulosis. Hysterectomy.  SKELETON: No focal hypermetabolic activity to suggest skeletal metastasis.  Incidental CT findings: none  IMPRESSION: 1. Hypermetabolic solid 1.4 cm left lower lobe pulmonary nodule, new, suspicious for primary bronchogenic carcinoma given the advanced smoking related changes in the lungs. 2. No hypermetabolic thoracic adenopathy or distant metastatic disease. 3. Mild hypermetabolism in the antral region of the stomach without CT correlate, nonspecific, which could be due to peristalsis, inflammatory change or early recurrent tumor. 4. Chronic findings include: Aortic Atherosclerosis (ICD10-I70.0) and Emphysema (ICD10-J43.9). Marked sigmoid diverticulosis. Moderate to large hiatal hernia. Three-vessel coronary atherosclerosis.   Electronically Signed   By: Ilona Sorrel  M.D.   On: 04/11/2018 07:45 I personally reviewed the PET CT images and concur with the findings noted above  Impression: Mrs. Warbington is a 77 year old woman with a long-standing history of tobacco abuse, history of a gastric MALT lymphoma treated with radiation in 2016, type 2 diabetes without complication, hyperlipidemia, anxiety and depression.  She recently had a PET/CT for follow-up of her MALT lymphoma.  She was found to have a new 1.4 cm lung nodule that was markedly hypermetabolic.  This is highly suspicious for a new primary bronchogenic carcinoma.  Other items in the differential include a second MALT lymphoma, carcinoid tumors, infectious and inflammatory nodules.  Given her long-standing smoking history and the degree of hypermetabolism for the size of the nodule, this has to be considered a new primary bronchogenic carcinoma unless we can prove otherwise.  I discussed potential options for diagnosis and treatment.  My recommendation was that we proceed with left VATS for wedge resection for definitive diagnosis.  We would do a left lower lobectomy at the same setting if this turned out to be a primary bronchogenic carcinoma on frozen section.  We also discussed the options of bronchoscopic or CT-guided biopsy and radiation therapy.  I described the general nature of the operation to her including the need for general anesthesia, the incisions to be used, the intraoperative decision making, use of a drainage tube postoperatively, the expected hospital stay, and the overall recovery.  I informed him of the indications, risk, benefits, and alternatives.  They understand the risks include, but not limited to death, MI, DVT, PE, bleeding, possible need for transfusion, infection, prolonged air leak, cardiac arrhythmias, as well as possibility of other unforeseeable complications.  She understands and accepts the risks and wishes to proceed.  She needs full pulmonary function testing with and  without bronchial dilators prior to surgery.  Coronary atherosclerosis on CT-no symptoms suggestive of angina.  No indication for further cardiac work-up at this time.  Dysphagia-scheduled for esophageal dilatation in August.  Plan: Pulmonary function testing with and without bronchodilators Left VATS, wedge resection, possible left lower lobectomy on Friday, 05/19/2018  [name], MD [facility] [phone]

## 2018-05-09 ENCOUNTER — Encounter: Payer: Self-pay | Admitting: *Deleted

## 2018-05-17 ENCOUNTER — Encounter (HOSPITAL_COMMUNITY)
Admission: RE | Admit: 2018-05-17 | Discharge: 2018-05-17 | Disposition: A | Discharge status: Home / Self Care | Payer: Medicare Other | Source: Ambulatory Visit | Attending: Thoracic Surgery (Cardiothoracic Vascular Surgery) | Admitting: Thoracic Surgery (Cardiothoracic Vascular Surgery)

## 2018-05-17 ENCOUNTER — Ambulatory Visit (HOSPITAL_COMMUNITY)
Admission: RE | Admit: 2018-05-17 | Discharge: 2018-05-17 | Disposition: A | Discharge status: Home / Self Care | Payer: Medicare Other | Source: Ambulatory Visit | Attending: Thoracic Surgery (Cardiothoracic Vascular Surgery) | Admitting: Thoracic Surgery (Cardiothoracic Vascular Surgery)

## 2018-05-17 ENCOUNTER — Other Ambulatory Visit: Payer: Self-pay

## 2018-05-17 ENCOUNTER — Encounter (HOSPITAL_COMMUNITY): Payer: Self-pay

## 2018-05-17 DIAGNOSIS — I700 Atherosclerosis of aorta: Secondary | ICD-10-CM

## 2018-05-17 DIAGNOSIS — R918 Other nonspecific abnormal finding of lung field: Secondary | ICD-10-CM | POA: Diagnosis not present

## 2018-05-17 DIAGNOSIS — Z01818 Encounter for other preprocedural examination: Secondary | ICD-10-CM | POA: Insufficient documentation

## 2018-05-17 DIAGNOSIS — I4891 Unspecified atrial fibrillation: Secondary | ICD-10-CM | POA: Diagnosis not present

## 2018-05-17 DIAGNOSIS — F17210 Nicotine dependence, cigarettes, uncomplicated: Secondary | ICD-10-CM | POA: Diagnosis not present

## 2018-05-17 DIAGNOSIS — K449 Diaphragmatic hernia without obstruction or gangrene: Secondary | ICD-10-CM

## 2018-05-17 DIAGNOSIS — C3432 Malignant neoplasm of lower lobe, left bronchus or lung: Secondary | ICD-10-CM | POA: Diagnosis not present

## 2018-05-17 DIAGNOSIS — Z888 Allergy status to other drugs, medicaments and biological substances status: Secondary | ICD-10-CM | POA: Diagnosis not present

## 2018-05-17 DIAGNOSIS — I2510 Atherosclerotic heart disease of native coronary artery without angina pectoris: Secondary | ICD-10-CM | POA: Diagnosis not present

## 2018-05-17 DIAGNOSIS — Z7984 Long term (current) use of oral hypoglycemic drugs: Secondary | ICD-10-CM | POA: Diagnosis not present

## 2018-05-17 DIAGNOSIS — R911 Solitary pulmonary nodule: Secondary | ICD-10-CM | POA: Insufficient documentation

## 2018-05-17 DIAGNOSIS — E119 Type 2 diabetes mellitus without complications: Secondary | ICD-10-CM | POA: Diagnosis not present

## 2018-05-17 DIAGNOSIS — I4510 Unspecified right bundle-branch block: Secondary | ICD-10-CM | POA: Insufficient documentation

## 2018-05-17 DIAGNOSIS — I10 Essential (primary) hypertension: Secondary | ICD-10-CM | POA: Diagnosis not present

## 2018-05-17 DIAGNOSIS — R001 Bradycardia, unspecified: Secondary | ICD-10-CM | POA: Diagnosis not present

## 2018-05-17 DIAGNOSIS — Z886 Allergy status to analgesic agent status: Secondary | ICD-10-CM | POA: Diagnosis not present

## 2018-05-17 DIAGNOSIS — J449 Chronic obstructive pulmonary disease, unspecified: Secondary | ICD-10-CM | POA: Diagnosis not present

## 2018-05-17 DIAGNOSIS — Z882 Allergy status to sulfonamides status: Secondary | ICD-10-CM | POA: Diagnosis not present

## 2018-05-17 HISTORY — DX: Gastro-esophageal reflux disease without esophagitis: K21.9

## 2018-05-17 LAB — URINALYSIS, ROUTINE W REFLEX MICROSCOPIC
BILIRUBIN URINE: NEGATIVE
Glucose, UA: NEGATIVE mg/dL
Hgb urine dipstick: NEGATIVE
Ketones, ur: NEGATIVE mg/dL
Nitrite: POSITIVE — AB
Protein, ur: NEGATIVE mg/dL
SPECIFIC GRAVITY, URINE: 1.009 (ref 1.005–1.030)
pH: 6 (ref 5.0–8.0)

## 2018-05-17 LAB — PULMONARY FUNCTION TEST
DL/VA % PRED: 50 %
DL/VA: 2.53 ml/min/mmHg/L
DLCO COR: 11.67 ml/min/mmHg
DLCO UNC % PRED: 42 %
DLCO cor % pred: 43 %
DLCO unc: 11.33 ml/min/mmHg
FEF 25-75 PRE: 0.6 L/s
FEF 25-75 Post: 0.67 L/sec
FEF2575-%CHANGE-POST: 10 %
FEF2575-%PRED-PRE: 34 %
FEF2575-%Pred-Post: 38 %
FEV1-%Change-Post: -3 %
FEV1-%Pred-Post: 63 %
FEV1-%Pred-Pre: 65 %
FEV1-POST: 1.45 L
FEV1-Pre: 1.5 L
FEV1FVC-%Change-Post: -10 %
FEV1FVC-%PRED-PRE: 83 %
FEV6-%CHANGE-POST: 4 %
FEV6-%PRED-PRE: 77 %
FEV6-%Pred-Post: 81 %
FEV6-Post: 2.36 L
FEV6-Pre: 2.25 L
FEV6FVC-%Change-Post: -3 %
FEV6FVC-%Pred-Post: 95 %
FEV6FVC-%Pred-Pre: 98 %
FVC-%Change-Post: 8 %
FVC-%Pred-Post: 85 %
FVC-%Pred-Pre: 79 %
FVC-Post: 2.6 L
FVC-Pre: 2.4 L
POST FEV6/FVC RATIO: 91 %
PRE FEV1/FVC RATIO: 63 %
Post FEV1/FVC ratio: 56 %
Pre FEV6/FVC Ratio: 94 %
RV % pred: 100 %
RV: 2.41 L
TLC % PRED: 96 %
TLC: 5.17 L

## 2018-05-17 LAB — COMPREHENSIVE METABOLIC PANEL
ALT: 15 U/L (ref 0–44)
ANION GAP: 9 (ref 5–15)
AST: 22 U/L (ref 15–41)
Albumin: 3.8 g/dL (ref 3.5–5.0)
Alkaline Phosphatase: 59 U/L (ref 38–126)
BILIRUBIN TOTAL: 0.4 mg/dL (ref 0.3–1.2)
BUN: 11 mg/dL (ref 8–23)
CHLORIDE: 104 mmol/L (ref 98–111)
CO2: 26 mmol/L (ref 22–32)
Calcium: 9.2 mg/dL (ref 8.9–10.3)
Creatinine, Ser: 0.86 mg/dL (ref 0.44–1.00)
GFR calc Af Amer: >60 mL/min (ref >60)
Glucose, Bld: 85 mg/dL (ref 70–99)
POTASSIUM: 3.8 mmol/L (ref 3.5–5.1)
Sodium: 139 mmol/L (ref 135–145)
TOTAL PROTEIN: 7.3 g/dL (ref 6.5–8.1)

## 2018-05-17 LAB — CBC
HEMATOCRIT: 41 % (ref 36.0–46.0)
Hemoglobin: 12.9 g/dL (ref 12.0–15.0)
MCH: 28.5 pg (ref 26.0–34.0)
MCHC: 31.5 g/dL (ref 30.0–36.0)
MCV: 90.7 fL (ref 78.0–100.0)
PLATELETS: 273 10*3/uL (ref 150–400)
RBC: 4.52 MIL/uL (ref 3.87–5.11)
RDW: 14.6 % (ref 11.5–15.5)
WBC: 8 10*3/uL (ref 4.0–10.5)

## 2018-05-17 LAB — BLOOD GAS, ARTERIAL
ACID-BASE EXCESS: 0.8 mmol/L (ref 0.0–2.0)
Bicarbonate: 24.3 mmol/L (ref 20.0–28.0)
Drawn by: 244901
FIO2: 21
O2 SAT: 96.4 %
PATIENT TEMPERATURE: 98.6
pCO2 arterial: 35.3 mmHg (ref 32.0–48.0)
pH, Arterial: 7.453 — ABNORMAL HIGH (ref 7.350–7.450)
pO2, Arterial: 80.1 mmHg — ABNORMAL LOW (ref 83.0–108.0)

## 2018-05-17 LAB — TYPE AND SCREEN
ABO/RH(D): O POS
ANTIBODY SCREEN: NEGATIVE

## 2018-05-17 LAB — APTT: APTT: 32 s (ref 24–36)

## 2018-05-17 LAB — PROTIME-INR
INR: 0.98
PROTHROMBIN TIME: 12.9 s (ref 11.4–15.2)

## 2018-05-17 LAB — HEMOGLOBIN A1C
HEMOGLOBIN A1C: 7.4 % — AB (ref 4.8–5.6)
MEAN PLASMA GLUCOSE: 165.68 mg/dL

## 2018-05-17 LAB — SURGICAL PCR SCREEN
MRSA, PCR: NEGATIVE
Staphylococcus aureus: NEGATIVE

## 2018-05-17 LAB — GLUCOSE, CAPILLARY: GLUCOSE-CAPILLARY: 84 mg/dL (ref 70–99)

## 2018-05-17 LAB — ABO/RH: ABO/RH(D): O POS

## 2018-05-17 MED ORDER — ALBUTEROL SULFATE (2.5 MG/3ML) 0.083% IN NEBU
2.5000 mg | INHALATION_SOLUTION | Freq: Once | RESPIRATORY_TRACT | Status: AC
  Administered 2018-05-17: 2.5 mg via RESPIRATORY_TRACT

## 2018-05-17 NOTE — Pre-Procedure Instructions (Signed)
LIZZET HENDLEY  05/17/2018      [id number] [place], [place] [address] [place] [address] Phone: [phone] Fax: [phone]  [place], [place] [facility] [address] [place] [address] [place] [address] Phone: [phone] Fax: [phone]    Your procedure is scheduled on  Friday 05/19/18  Report to [facility] Admitting at 630 A.M.  Call this number if you have problems the morning of surgery:  [phone]   Remember:  Do not eat or drink after midnight.    Take these medicines the morning of surgery with A SIP OF WATER     7 days prior to surgery STOP taking any Aspirin(unless otherwise instructed by your surgeon), Aleve, Naproxen, Ibuprofen, Motrin, Advil, Goody's, BC's, all herbal medications, fish oil, and all vitamins     How to Manage Your Diabetes Before and After Surgery  Why is it important to control my blood sugar before and after surgery? . Improving blood sugar levels before and after surgery helps healing and can limit problems. . A way of improving blood sugar control is eating a healthy diet by: o  Eating less sugar and carbohydrates o  Increasing activity/exercise o  Talking with your doctor about reaching your blood sugar goals . High blood sugars (greater than 180 mg/dL) can raise your risk of infections and slow your recovery, so you will need to focus on controlling your diabetes during the weeks before surgery. . Make sure that the doctor who takes care of your diabetes knows about your planned surgery including the date and location.  How do I manage my blood sugar before surgery? . Check your blood sugar at least 4 times a day, starting 2 days before surgery, to make sure that the level is not too high or low. o Check your blood sugar the morning of your surgery when you wake up and every 2 hours until you get to the Short Stay  unit. . If your blood sugar is less than 70 mg/dL, you will need to treat for low blood sugar: o Do not take insulin. o Treat a low blood sugar (less than 70 mg/dL) with  cup of clear juice (cranberry or apple), 4 glucose tablets, OR glucose gel. Recheck blood sugar in 15 minutes after treatment (to make sure it is greater than 70 mg/dL). If your blood sugar is not greater than 70 mg/dL on recheck, call [phone] o  for further instructions. . Report your blood sugar to the short stay nurse when you get to Short Stay.  . If you are admitted to the hospital after surgery: o Your blood sugar will be checked by the staff and you will probably be given insulin after surgery (instead of oral diabetes medicines) to make sure you have good blood sugar levels. o The goal for blood sugar control after surgery is 80-180 mg/dL.              WHAT DO I DO ABOUT MY DIABETES MEDICATION?   Marland Kitchen Do not take oral diabetes medicines (pills) the morning of surgery.  . THE NIGHT BEFORE SURGERY, take ___________ units of ___________insulin.       . THE MORNING OF SURGERY, take _____________ units of __________insulin.  . The day of surgery, do not take other diabetes injectables, including Byetta (exenatide), Bydureon (exenatide ER), Victoza (liraglutide), or Trulicity (  dulaglutide).  . If your CBG is greater than 220 mg/dL, you may take  of your sliding scale (correction) dose of insulin.  Other Instructions:          Patient Signature:  Date:   Nurse Signature:  Date:   Reviewed and Endorsed by [facility] Patient Education Committee, August 2015  Do not wear jewelry, make-up or nail polish.  Do not wear lotions, powders, or perfumes, or deodorant.  Do not shave 48 hours prior to surgery.  Men may shave face and neck.  Do not bring valuables to the hospital.  [facility] is not responsible for any belongings or valuables.  Contacts, dentures or bridgework may not be worn into  surgery.  Leave your suitcase in the car.  After surgery it may be brought to your room.  For patients admitted to the hospital, discharge time will be determined by your treatment team.  Patients discharged the day of surgery will not be allowed to drive home.   Name and phone number of your driver:    Special instructions:  [place] - Preparing for Surgery  Before surgery, you can play an important role.  Because skin is not sterile, your skin needs to be as free of germs as possible.  You can reduce the number of germs on you skin by washing with CHG (chlorahexidine gluconate) soap before surgery.  CHG is an antiseptic cleaner which kills germs and bonds with the skin to continue killing germs even after washing.  Oral Hygiene is also important in reducing the risk of infection.  Remember to brush your teeth with your regular toothpaste the morning of surgery.  Please DO NOT use if you have an allergy to CHG or antibacterial soaps.  If your skin becomes reddened/irritated stop using the CHG and inform your nurse when you arrive at Short Stay.  Do not shave (including legs and underarms) for at least 48 hours prior to the first CHG shower.  You may shave your face.  Please follow these instructions carefully:   1.  Shower with CHG Soap the night before surgery and the morning of Surgery.  2.  If you choose to wash your hair, wash your hair first as usual with your normal shampoo.  3.  After you shampoo, rinse your hair and body thoroughly to remove the shampoo. 4.  Use CHG as you would any other liquid soap.  You can apply chg directly to the skin and wash gently with a      scrungie or washcloth.           5.  Apply the CHG Soap to your body ONLY FROM THE NECK DOWN.   Do not use on open wounds or open sores. Avoid contact with your eyes, ears, mouth and genitals (private parts).  Wash genitals (private parts) with your normal soap.  6.  Wash thoroughly, paying special attention to the  area where your surgery will be performed.  7.  Thoroughly rinse your body with warm water from the neck down.  8.  DO NOT shower/wash with your normal soap after using and rinsing off the CHG Soap.  9.  Pat yourself dry with a clean towel.            10.  Wear clean pajamas.            11.  Place clean sheets on your bed the night of your first shower and do not sleep with pets.  Day of Surgery  Do not apply any lotions/deoderants the morning of surgery.   Please wear clean clothes to the hospital/surgery center. Remember to brush your teeth with toothpaste.     Please read over the following fact sheets that you were given. Pain Booklet, MRSA Information and Surgical Site Infection Prevention

## 2018-05-17 NOTE — Pre-Procedure Instructions (Signed)
Monica [name]  05/17/2018      [id number] - [place], [place] [address] [place] [address] Phone: [phone] Fax: [phone]  [place], [place] [facility] [address] [place] [address] [place] [address] Phone: [phone] Fax: [phone]    Your procedure is scheduled on  Friday 05/19/18  Report to [facility] Admitting at 630 A.M.  Call this number if you have problems the morning of surgery:  [phone]   Remember:  Do not eat or drink after midnight.    Take these medicines the morning of surgery with A SIP OF WATER -  CITALOPRAM , PANTOPRAZOLE    7 days prior to surgery STOP taking any Aspirin(unless otherwise instructed by your surgeon), Aleve, Naproxen, Ibuprofen, Motrin, Advil, Goody's, BC's, all herbal medications, fish oil, and all vitamins     How to Manage Your Diabetes Before and After Surgery  Why is it important to control my blood sugar before and after surgery? . Improving blood sugar levels before and after surgery helps healing and can limit problems. . A way of improving blood sugar control is eating a healthy diet by: o  Eating less sugar and carbohydrates o  Increasing activity/exercise o  Talking with your doctor about reaching your blood sugar goals . High blood sugars (greater than 180 mg/dL) can raise your risk of infections and slow your recovery, so you will need to focus on controlling your diabetes during the weeks before surgery. . Make sure that the doctor who takes care of your diabetes knows about your planned surgery including the date and location.  How do I manage my blood sugar before surgery? . Check your blood sugar at least 4 times a day, starting 2 days before surgery, to make sure that the level is not too high or low. o Check your blood sugar the morning of your surgery when you wake up and every 2 hours  until you get to the Short Stay unit. . If your blood sugar is less than 70 mg/dL, you will need to treat for low blood sugar: o Do not take insulin. o Treat a low blood sugar (less than 70 mg/dL) with  cup of clear juice (cranberry or apple), 4 glucose tablets, OR glucose gel. Recheck blood sugar in 15 minutes after treatment (to make sure it is greater than 70 mg/dL). If your blood sugar is not greater than 70 mg/dL on recheck, call [phone] o  for further instructions. . Report your blood sugar to the short stay nurse when you get to Short Stay.  . If you are admitted to the hospital after surgery: o Your blood sugar will be checked by the staff and you will probably be given insulin after surgery (instead of oral diabetes medicines) to make sure you have good blood sugar levels. o The goal for blood sugar control after surgery is 80-180 mg/dL.              WHAT DO I DO ABOUT MY DIABETES MEDICATION?   Marland Kitchen Do not take oral diabetes medicines (pills) the morning of surgery.  . THE NIGHT BEFORE SURGERY, take ___________ units of ___________insulin.       . THE MORNING OF SURGERY, take _____________ units of __________insulin.  . The day of surgery, do not take other diabetes injectables, including Byetta (exenatide), Bydureon (exenatide ER),  Victoza (liraglutide), or Trulicity (dulaglutide).  . If your CBG is greater than 220 mg/dL, you may take  of your sliding scale (correction) dose of insulin.  Other Instructions:          Patient Signature:  Date:   Nurse Signature:  Date:   Reviewed and Endorsed by [facility] Patient Education Committee, August 2015  Do not wear jewelry, make-up or nail polish.  Do not wear lotions, powders, or perfumes, or deodorant.  Do not shave 48 hours prior to surgery.  Men may shave face and neck.  Do not bring valuables to the hospital.  [facility] is not responsible for any belongings or valuables.  Contacts, dentures or  bridgework may not be worn into surgery.  Leave your suitcase in the car.  After surgery it may be brought to your room.  For patients admitted to the hospital, discharge time will be determined by your treatment team.  Patients discharged the day of surgery will not be allowed to drive home.   Name and phone number of your driver:    Special instructions:  [place] - Preparing for Surgery  Before surgery, you can play an important role.  Because skin is not sterile, your skin needs to be as free of germs as possible.  You can reduce the number of germs on you skin by washing with CHG (chlorahexidine gluconate) soap before surgery.  CHG is an antiseptic cleaner which kills germs and bonds with the skin to continue killing germs even after washing.  Oral Hygiene is also important in reducing the risk of infection.  Remember to brush your teeth with your regular toothpaste the morning of surgery.  Please DO NOT use if you have an allergy to CHG or antibacterial soaps.  If your skin becomes reddened/irritated stop using the CHG and inform your nurse when you arrive at Short Stay.  Do not shave (including legs and underarms) for at least 48 hours prior to the first CHG shower.  You may shave your face.  Please follow these instructions carefully:   1.  Shower with CHG Soap the night before surgery and the morning of Surgery.  2.  If you choose to wash your hair, wash your hair first as usual with your normal shampoo.  3.  After you shampoo, rinse your hair and body thoroughly to remove the shampoo. 4.  Use CHG as you would any other liquid soap.  You can apply chg directly to the skin and wash gently with a      scrungie or washcloth.           5.  Apply the CHG Soap to your body ONLY FROM THE NECK DOWN.   Do not use on open wounds or open sores. Avoid contact with your eyes, ears, mouth and genitals (private parts).  Wash genitals (private parts) with your normal soap.  6.  Wash thoroughly,  paying special attention to the area where your surgery will be performed.  7.  Thoroughly rinse your body with warm water from the neck down.  8.  DO NOT shower/wash with your normal soap after using and rinsing off the CHG Soap.  9.  Pat yourself dry with a clean towel.            10.  Wear clean pajamas.            11.  Place clean sheets on your bed the night of your first shower and do not  sleep with pets.  Day of Surgery  Do not apply any lotions/deoderants the morning of surgery.   Please wear clean clothes to the hospital/surgery center. Remember to brush your teeth with toothpaste.     Please read over the following fact sheets that you were given. Pain Booklet, MRSA Information and Surgical Site Infection Prevention

## 2018-05-17 NOTE — Progress Notes (Signed)
MULTIPLE ATTEMPTS MADE TO CONTACT PHARMACY TECH.  REVIEWED MED LIST WITH PATIENT AT PAT VISIT.

## 2018-05-18 ENCOUNTER — Telehealth: Payer: Self-pay | Admitting: Internal Medicine

## 2018-05-18 MED ORDER — PANTOPRAZOLE SODIUM 40 MG PO TBEC: 40.0000 mg | DELAYED_RELEASE_TABLET | Freq: Every day | ORAL | 3 refills | Status: DC

## 2018-05-18 NOTE — Telephone Encounter (Signed)
Done

## 2018-05-18 NOTE — Telephone Encounter (Signed)
Forwarding to refill box.  

## 2018-05-18 NOTE — Anesthesia Preprocedure Evaluation (Addendum)
Anesthesia Evaluation  Patient identified by MRN, date of birth, ID band Patient awake    Reviewed: Allergy & Precautions, NPO status , Patient's Chart, lab work & pertinent test results, reviewed documented beta blocker date and time   History of Anesthesia Complications Negative for: history of anesthetic complications  Airway Mallampati: I  TM Distance: >3 FB Neck ROM: Full    Dental no notable dental hx. (+) Edentulous Upper, Dental Advisory Given   Pulmonary neg pulmonary ROS, Current Smoker,    Pulmonary exam normal        Cardiovascular hypertension, Pt. on home beta blockers Normal cardiovascular exam     Neuro/Psych PSYCHIATRIC DISORDERS Depression  Neuromuscular disease    GI/Hepatic Neg liver ROS, hiatal hernia, GERD  ,  Endo/Other  diabetes  Renal/GU negative Renal ROS     Musculoskeletal negative musculoskeletal ROS (+)   Abdominal   Peds  Hematology negative hematology ROS (+)   Anesthesia Other Findings Day of surgery medications reviewed with the patient.  Reproductive/Obstetrics                           Anesthesia Physical Anesthesia Plan  ASA: III  Anesthesia Plan: General   Post-op Pain Management:    Induction: Intravenous  PONV Risk Score and Plan: 3 and Ondansetron, Dexamethasone and Scopolamine patch - Pre-op  Airway Management Planned: Double Lumen EBT  Additional Equipment: Arterial line, CVP and Ultrasound Guidance Line Placement  Intra-op Plan:   Post-operative Plan: Possible Post-op intubation/ventilation  Informed Consent: I have reviewed the patients History and Physical, chart, labs and discussed the procedure including the risks, benefits and alternatives for the proposed anesthesia with the patient or authorized representative who has indicated his/her understanding and acceptance.   Dental advisory given  Plan Discussed with: CRNA and  Anesthesiologist  Anesthesia Plan Comments:        Anesthesia Quick Evaluation

## 2018-05-18 NOTE — Telephone Encounter (Signed)
Pt said she needed a 90 day supply of pantoprazole called into [facility]

## 2018-05-18 NOTE — Telephone Encounter (Signed)
Pt said she needed a 90 day supply of pantoprazole called into [facility].

## 2018-05-19 ENCOUNTER — Inpatient Hospital Stay (HOSPITAL_COMMUNITY)
Admission: RE | Admit: 2018-05-19 | Discharge: 2018-05-23 | DRG: 165 | Disposition: A | Discharge status: Home / Self Care | Payer: Medicare Other | Attending: Thoracic Surgery (Cardiothoracic Vascular Surgery) | Admitting: Thoracic Surgery (Cardiothoracic Vascular Surgery)

## 2018-05-19 ENCOUNTER — Inpatient Hospital Stay (HOSPITAL_COMMUNITY): Payer: Medicare Other

## 2018-05-19 ENCOUNTER — Other Ambulatory Visit: Payer: Self-pay

## 2018-05-19 ENCOUNTER — Encounter (HOSPITAL_COMMUNITY): Payer: Self-pay

## 2018-05-19 ENCOUNTER — Inpatient Hospital Stay (HOSPITAL_COMMUNITY): Payer: Medicare Other | Admitting: Certified Registered"

## 2018-05-19 ENCOUNTER — Inpatient Hospital Stay (HOSPITAL_COMMUNITY): Payer: Medicare Other | Admitting: Physician Assistant

## 2018-05-19 ENCOUNTER — Encounter (HOSPITAL_COMMUNITY)
Admission: RE | Disposition: A | Discharge status: Home / Self Care | Payer: Self-pay | Source: Home / Self Care | Attending: Thoracic Surgery (Cardiothoracic Vascular Surgery)

## 2018-05-19 DIAGNOSIS — F17210 Nicotine dependence, cigarettes, uncomplicated: Secondary | ICD-10-CM | POA: Diagnosis present

## 2018-05-19 DIAGNOSIS — Z882 Allergy status to sulfonamides status: Secondary | ICD-10-CM

## 2018-05-19 DIAGNOSIS — R001 Bradycardia, unspecified: Secondary | ICD-10-CM | POA: Diagnosis not present

## 2018-05-19 DIAGNOSIS — Z7984 Long term (current) use of oral hypoglycemic drugs: Secondary | ICD-10-CM

## 2018-05-19 DIAGNOSIS — Z8572 Personal history of non-Hodgkin lymphomas: Secondary | ICD-10-CM

## 2018-05-19 DIAGNOSIS — I4891 Unspecified atrial fibrillation: Secondary | ICD-10-CM | POA: Diagnosis not present

## 2018-05-19 DIAGNOSIS — Z4682 Encounter for fitting and adjustment of non-vascular catheter: Secondary | ICD-10-CM

## 2018-05-19 DIAGNOSIS — J939 Pneumothorax, unspecified: Secondary | ICD-10-CM | POA: Diagnosis not present

## 2018-05-19 DIAGNOSIS — C3432 Malignant neoplasm of lower lobe, left bronchus or lung: Secondary | ICD-10-CM | POA: Diagnosis present

## 2018-05-19 DIAGNOSIS — J9811 Atelectasis: Secondary | ICD-10-CM | POA: Diagnosis not present

## 2018-05-19 DIAGNOSIS — C3490 Malignant neoplasm of unspecified part of unspecified bronchus or lung: Secondary | ICD-10-CM | POA: Insufficient documentation

## 2018-05-19 DIAGNOSIS — I480 Paroxysmal atrial fibrillation: Secondary | ICD-10-CM | POA: Diagnosis not present

## 2018-05-19 DIAGNOSIS — Z902 Acquired absence of lung [part of]: Secondary | ICD-10-CM | POA: Diagnosis not present

## 2018-05-19 DIAGNOSIS — J449 Chronic obstructive pulmonary disease, unspecified: Secondary | ICD-10-CM | POA: Diagnosis present

## 2018-05-19 DIAGNOSIS — R911 Solitary pulmonary nodule: Secondary | ICD-10-CM

## 2018-05-19 DIAGNOSIS — E119 Type 2 diabetes mellitus without complications: Secondary | ICD-10-CM | POA: Diagnosis present

## 2018-05-19 DIAGNOSIS — Z888 Allergy status to other drugs, medicaments and biological substances status: Secondary | ICD-10-CM

## 2018-05-19 DIAGNOSIS — D3A090 Benign carcinoid tumor of the bronchus and lung: Secondary | ICD-10-CM | POA: Diagnosis not present

## 2018-05-19 DIAGNOSIS — I2510 Atherosclerotic heart disease of native coronary artery without angina pectoris: Secondary | ICD-10-CM | POA: Diagnosis present

## 2018-05-19 DIAGNOSIS — I10 Essential (primary) hypertension: Secondary | ICD-10-CM | POA: Diagnosis present

## 2018-05-19 DIAGNOSIS — Z886 Allergy status to analgesic agent status: Secondary | ICD-10-CM | POA: Diagnosis not present

## 2018-05-19 DIAGNOSIS — C3412 Malignant neoplasm of upper lobe, left bronchus or lung: Secondary | ICD-10-CM | POA: Diagnosis not present

## 2018-05-19 DIAGNOSIS — Z09 Encounter for follow-up examination after completed treatment for conditions other than malignant neoplasm: Secondary | ICD-10-CM

## 2018-05-19 DIAGNOSIS — R918 Other nonspecific abnormal finding of lung field: Secondary | ICD-10-CM | POA: Diagnosis not present

## 2018-05-19 DIAGNOSIS — J90 Pleural effusion, not elsewhere classified: Secondary | ICD-10-CM | POA: Diagnosis not present

## 2018-05-19 HISTORY — PX: LOBECTOMY: SHX5089

## 2018-05-19 HISTORY — PX: VIDEO ASSISTED THORACOSCOPY (VATS)/WEDGE RESECTION: SHX6174

## 2018-05-19 LAB — GLUCOSE, CAPILLARY
GLUCOSE-CAPILLARY: 143 mg/dL — AB (ref 70–99)
GLUCOSE-CAPILLARY: 159 mg/dL — AB (ref 70–99)
Glucose-Capillary: 129 mg/dL — ABNORMAL HIGH (ref 70–99)
Glucose-Capillary: 159 mg/dL — ABNORMAL HIGH (ref 70–99)

## 2018-05-19 LAB — COMPREHENSIVE METABOLIC PANEL
ALT: 22 U/L (ref 0–44)
AST: 31 U/L (ref 15–41)
Albumin: 3.6 g/dL (ref 3.5–5.0)
Alkaline Phosphatase: 58 U/L (ref 38–126)
Anion gap: 9 (ref 5–15)
BILIRUBIN TOTAL: 0.6 mg/dL (ref 0.3–1.2)
BUN: 13 mg/dL (ref 8–23)
CO2: 26 mmol/L (ref 22–32)
Calcium: 8.6 mg/dL — ABNORMAL LOW (ref 8.9–10.3)
Chloride: 104 mmol/L (ref 98–111)
Creatinine, Ser: 0.8 mg/dL (ref 0.44–1.00)
GFR calc non Af Amer: >60 mL/min (ref >60)
Glucose, Bld: 167 mg/dL — ABNORMAL HIGH (ref 70–99)
POTASSIUM: 4.6 mmol/L (ref 3.5–5.1)
Sodium: 139 mmol/L (ref 135–145)
Total Protein: 6.6 g/dL (ref 6.5–8.1)

## 2018-05-19 SURGERY — VIDEO ASSISTED THORACOSCOPY (VATS)/WEDGE RESECTION
Anesthesia: General | Site: Chest | Laterality: Left

## 2018-05-19 MED ORDER — CEFAZOLIN SODIUM-DEXTROSE 2-4 GM/100ML-% IV SOLN
2.0000 g | INTRAVENOUS | Status: DC
  Filled 2018-05-19: qty 100

## 2018-05-19 MED ORDER — HYDROMORPHONE HCL 1 MG/ML IJ SOLN
INTRAMUSCULAR | Status: AC
  Filled 2018-05-19: qty 1

## 2018-05-19 MED ORDER — CEFAZOLIN SODIUM-DEXTROSE 2-4 GM/100ML-% IV SOLN
2.0000 g | Freq: Three times a day (TID) | INTRAVENOUS | Status: AC
  Administered 2018-05-19 – 2018-05-20 (×2): 2 g via INTRAVENOUS
  Filled 2018-05-19 (×2): qty 100

## 2018-05-19 MED ORDER — PROMETHAZINE HCL 25 MG/ML IJ SOLN
6.2500 mg | INTRAMUSCULAR | Status: DC | PRN
Start: 2018-05-19 — End: 2018-05-19

## 2018-05-19 MED ORDER — 0.9 % SODIUM CHLORIDE (POUR BTL) OPTIME
TOPICAL | Status: DC | PRN
  Administered 2018-05-19: 2000 mL

## 2018-05-19 MED ORDER — SODIUM CHLORIDE 0.9 % IV SOLN
INTRAVENOUS | Status: DC | PRN
  Administered 2018-05-19: 20 mL

## 2018-05-19 MED ORDER — PRAVASTATIN SODIUM 40 MG PO TABS
40.0000 mg | ORAL_TABLET | Freq: Every day | ORAL | Status: DC
  Administered 2018-05-19 – 2018-05-22 (×4): 40 mg via ORAL
  Filled 2018-05-19 (×4): qty 1

## 2018-05-19 MED ORDER — EPHEDRINE SULFATE 50 MG/ML IJ SOLN
INTRAMUSCULAR | Status: DC | PRN
  Administered 2018-05-19: 5 mg via INTRAVENOUS

## 2018-05-19 MED ORDER — SODIUM CHLORIDE 0.45 % IV SOLN
INTRAVENOUS | Status: DC
  Administered 2018-05-19: 15:00:00 via INTRAVENOUS

## 2018-05-19 MED ORDER — ALBUTEROL SULFATE (2.5 MG/3ML) 0.083% IN NEBU
2.5000 mg | INHALATION_SOLUTION | RESPIRATORY_TRACT | Status: DC
  Administered 2018-05-19 – 2018-05-23 (×16): 2.5 mg via RESPIRATORY_TRACT
  Filled 2018-05-19 (×17): qty 3

## 2018-05-19 MED ORDER — SODIUM CHLORIDE 0.9 % IJ SOLN
INTRAMUSCULAR | Status: DC | PRN
  Administered 2018-05-19: 50 mL via INTRAVENOUS

## 2018-05-19 MED ORDER — FENTANYL CITRATE (PF) 250 MCG/5ML IJ SOLN
INTRAMUSCULAR | Status: DC | PRN
  Administered 2018-05-19: 25 ug via INTRAVENOUS
  Administered 2018-05-19: 50 ug via INTRAVENOUS
  Administered 2018-05-19: 75 ug via INTRAVENOUS
  Administered 2018-05-19 (×4): 50 ug via INTRAVENOUS

## 2018-05-19 MED ORDER — LACTATED RINGERS IV SOLN
INTRAVENOUS | Status: DC | PRN
  Administered 2018-05-19: 08:00:00 via INTRAVENOUS

## 2018-05-19 MED ORDER — NALOXONE HCL 0.4 MG/ML IJ SOLN: 0.4000 mg | INTRAMUSCULAR | Status: DC | PRN

## 2018-05-19 MED ORDER — SENNOSIDES-DOCUSATE SODIUM 8.6-50 MG PO TABS
1.0000 | ORAL_TABLET | Freq: Every day | ORAL | Status: DC
  Administered 2018-05-19 – 2018-05-22 (×4): 1 via ORAL
  Filled 2018-05-19 (×4): qty 1

## 2018-05-19 MED ORDER — SODIUM CHLORIDE 0.9% FLUSH: 9.0000 mL | INTRAVENOUS | Status: DC | PRN

## 2018-05-19 MED ORDER — ONDANSETRON HCL 4 MG/2ML IJ SOLN: 4.0000 mg | Freq: Four times a day (QID) | INTRAMUSCULAR | Status: DC | PRN

## 2018-05-19 MED ORDER — LACTATED RINGERS IV SOLN
INTRAVENOUS | Status: DC | PRN
  Administered 2018-05-19: 07:00:00 via INTRAVENOUS

## 2018-05-19 MED ORDER — FENTANYL CITRATE (PF) 250 MCG/5ML IJ SOLN
INTRAMUSCULAR | Status: AC
  Filled 2018-05-19: qty 5

## 2018-05-19 MED ORDER — SCOPOLAMINE 1 MG/3DAYS TD PT72
1.0000 | MEDICATED_PATCH | TRANSDERMAL | Status: DC
  Administered 2018-05-19: 1.5 mg via TRANSDERMAL
  Filled 2018-05-19: qty 1

## 2018-05-19 MED ORDER — PANTOPRAZOLE SODIUM 40 MG PO TBEC
40.0000 mg | DELAYED_RELEASE_TABLET | Freq: Every day | ORAL | Status: DC
  Administered 2018-05-20 – 2018-05-23 (×4): 40 mg via ORAL
  Filled 2018-05-19 (×4): qty 1

## 2018-05-19 MED ORDER — SUGAMMADEX SODIUM 200 MG/2ML IV SOLN
INTRAVENOUS | Status: DC | PRN
  Administered 2018-05-19: 150 mg via INTRAVENOUS

## 2018-05-19 MED ORDER — BUPIVACAINE LIPOSOME 1.3 % IJ SUSP
20.0000 mL | INTRAMUSCULAR | Status: DC
  Filled 2018-05-19: qty 20

## 2018-05-19 MED ORDER — BISACODYL 5 MG PO TBEC
10.0000 mg | DELAYED_RELEASE_TABLET | Freq: Every day | ORAL | Status: DC
  Administered 2018-05-20 – 2018-05-21 (×2): 10 mg via ORAL
  Filled 2018-05-19 (×3): qty 2

## 2018-05-19 MED ORDER — ASPIRIN EC 81 MG PO TBEC
81.0000 mg | DELAYED_RELEASE_TABLET | Freq: Every day | ORAL | Status: DC
  Administered 2018-05-20 – 2018-05-22 (×3): 81 mg via ORAL
  Filled 2018-05-19 (×3): qty 1

## 2018-05-19 MED ORDER — CITALOPRAM HYDROBROMIDE 20 MG PO TABS
20.0000 mg | ORAL_TABLET | Freq: Every day | ORAL | Status: DC
  Administered 2018-05-19 – 2018-05-23 (×5): 20 mg via ORAL
  Filled 2018-05-19 (×5): qty 1

## 2018-05-19 MED ORDER — INSULIN ASPART 100 UNIT/ML ~~LOC~~ SOLN
0.0000 [IU] | Freq: Four times a day (QID) | SUBCUTANEOUS | Status: DC
  Administered 2018-05-19: 4 [IU] via SUBCUTANEOUS
  Administered 2018-05-20 – 2018-05-21 (×3): 2 [IU] via SUBCUTANEOUS

## 2018-05-19 MED ORDER — METOPROLOL TARTRATE 12.5 MG HALF TABLET
25.0000 mg | ORAL_TABLET | Freq: Once | ORAL | Status: AC
  Administered 2018-05-19: 25 mg via ORAL
  Filled 2018-05-19: qty 2

## 2018-05-19 MED ORDER — ACETAMINOPHEN 500 MG PO TABS
1000.0000 mg | ORAL_TABLET | Freq: Four times a day (QID) | ORAL | Status: DC
  Administered 2018-05-20 – 2018-05-23 (×10): 1000 mg via ORAL
  Filled 2018-05-19 (×11): qty 2

## 2018-05-19 MED ORDER — METFORMIN HCL ER 500 MG PO TB24
500.0000 mg | ORAL_TABLET | Freq: Two times a day (BID) | ORAL | Status: DC
  Administered 2018-05-20 – 2018-05-23 (×7): 500 mg via ORAL
  Filled 2018-05-19 (×8): qty 1

## 2018-05-19 MED ORDER — PROPOFOL 10 MG/ML IV BOLUS
INTRAVENOUS | Status: DC | PRN
  Administered 2018-05-19: 160 mg via INTRAVENOUS

## 2018-05-19 MED ORDER — HYDROMORPHONE HCL 1 MG/ML IJ SOLN
0.2500 mg | INTRAMUSCULAR | Status: DC | PRN
  Administered 2018-05-19 (×2): 0.5 mg via INTRAVENOUS

## 2018-05-19 MED ORDER — ACETAMINOPHEN 160 MG/5ML PO SOLN
1000.0000 mg | Freq: Four times a day (QID) | ORAL | Status: DC
  Administered 2018-05-19: 1000 mg via ORAL
  Filled 2018-05-19: qty 40.6

## 2018-05-19 MED ORDER — FENTANYL 40 MCG/ML IV SOLN
INTRAVENOUS | Status: DC
  Administered 2018-05-19: 40 ug via INTRAVENOUS
  Administered 2018-05-19: 180 ug via INTRAVENOUS
  Administered 2018-05-19: 1000 ug via INTRAVENOUS
  Administered 2018-05-20: 160 ug via INTRAVENOUS
  Administered 2018-05-20: 60 ug via INTRAVENOUS
  Administered 2018-05-20 – 2018-05-21 (×2): 10 ug via INTRAVENOUS
  Administered 2018-05-21: 50 ug via INTRAVENOUS
  Administered 2018-05-21: 70 ug via INTRAVENOUS
  Filled 2018-05-19: qty 25

## 2018-05-19 MED ORDER — DIPHENHYDRAMINE HCL 50 MG/ML IJ SOLN: 12.5000 mg | Freq: Four times a day (QID) | INTRAMUSCULAR | Status: DC | PRN

## 2018-05-19 MED ORDER — LACTATED RINGERS IV SOLN
INTRAVENOUS | Status: DC
Start: 2018-05-19 — End: 2018-05-19

## 2018-05-19 MED ORDER — MIDAZOLAM HCL 2 MG/2ML IJ SOLN
INTRAMUSCULAR | Status: AC
  Filled 2018-05-19: qty 2

## 2018-05-19 MED ORDER — TRAMADOL HCL 50 MG PO TABS: 50.0000 mg | ORAL_TABLET | Freq: Four times a day (QID) | ORAL | Status: DC | PRN

## 2018-05-19 MED ORDER — BUPIVACAINE HCL (PF) 0.5 % IJ SOLN
INTRAMUSCULAR | Status: DC | PRN
  Administered 2018-05-19: 30 mL

## 2018-05-19 MED ORDER — POTASSIUM CHLORIDE 10 MEQ/50ML IV SOLN: 10.0000 meq | Freq: Every day | INTRAVENOUS | Status: DC | PRN

## 2018-05-19 MED ORDER — OXYCODONE HCL 5 MG PO TABS
5.0000 mg | ORAL_TABLET | ORAL | Status: DC | PRN
  Administered 2018-05-19 – 2018-05-21 (×3): 5 mg via ORAL
  Filled 2018-05-19 (×3): qty 1

## 2018-05-19 MED ORDER — BUPIVACAINE HCL (PF) 0.5 % IJ SOLN
INTRAMUSCULAR | Status: AC
Start: 2018-05-19 — End: ?
  Filled 2018-05-19: qty 30

## 2018-05-19 MED ORDER — ROCURONIUM BROMIDE 10 MG/ML (PF) SYRINGE
PREFILLED_SYRINGE | INTRAVENOUS | Status: DC | PRN
  Administered 2018-05-19: 60 mg via INTRAVENOUS
  Administered 2018-05-19: 20 mg via INTRAVENOUS

## 2018-05-19 MED ORDER — ENOXAPARIN SODIUM 40 MG/0.4ML ~~LOC~~ SOLN
40.0000 mg | SUBCUTANEOUS | Status: DC
  Administered 2018-05-20 – 2018-05-23 (×4): 40 mg via SUBCUTANEOUS
  Filled 2018-05-19 (×4): qty 0.4

## 2018-05-19 MED ORDER — LIDOCAINE 2% (20 MG/ML) 5 ML SYRINGE
INTRAMUSCULAR | Status: DC | PRN
  Administered 2018-05-19: 60 mg via INTRAVENOUS

## 2018-05-19 MED ORDER — CEFAZOLIN SODIUM-DEXTROSE 2-3 GM-%(50ML) IV SOLR
INTRAVENOUS | Status: DC | PRN
  Administered 2018-05-19: 2 g via INTRAVENOUS

## 2018-05-19 MED ORDER — DEXAMETHASONE SODIUM PHOSPHATE 10 MG/ML IJ SOLN
INTRAMUSCULAR | Status: DC | PRN
  Administered 2018-05-19: 10 mg via INTRAVENOUS

## 2018-05-19 MED ORDER — DIPHENHYDRAMINE HCL 12.5 MG/5ML PO ELIX
12.5000 mg | ORAL_SOLUTION | Freq: Four times a day (QID) | ORAL | Status: DC | PRN
  Administered 2018-05-20: 12.5 mg via ORAL
  Filled 2018-05-19 (×2): qty 5

## 2018-05-19 MED ORDER — PHENYLEPHRINE HCL 10 MG/ML IJ SOLN
INTRAMUSCULAR | Status: DC | PRN
  Administered 2018-05-19: 15 ug/min via INTRAVENOUS

## 2018-05-19 MED ORDER — METOPROLOL TARTRATE 25 MG PO TABS
25.0000 mg | ORAL_TABLET | Freq: Every day | ORAL | Status: DC
  Administered 2018-05-19 – 2018-05-20 (×2): 25 mg via ORAL
  Filled 2018-05-19 (×2): qty 1

## 2018-05-19 MED ORDER — ONDANSETRON HCL 4 MG/2ML IJ SOLN
INTRAMUSCULAR | Status: DC | PRN
  Administered 2018-05-19: 4 mg via INTRAVENOUS

## 2018-05-19 MED ORDER — MIDAZOLAM HCL 2 MG/2ML IJ SOLN
INTRAMUSCULAR | Status: DC | PRN
  Administered 2018-05-19 (×2): 1 mg via INTRAVENOUS

## 2018-05-19 MED ORDER — ALBUMIN HUMAN 5 % IV SOLN
INTRAVENOUS | Status: DC | PRN
  Administered 2018-05-19: 10:00:00 via INTRAVENOUS

## 2018-05-19 SURGICAL SUPPLY — 93 items
CANISTER SUCT 3000ML PPV (MISCELLANEOUS) ×4 IMPLANT
CATH THORACIC 28FR (CATHETERS) ×4 IMPLANT
CATH THORACIC 36FR (CATHETERS) IMPLANT
CATH THORACIC 36FR RT ANG (CATHETERS) IMPLANT
CLIP VESOCCLUDE MED 6/CT (CLIP) ×4 IMPLANT
CONN ST 1/4X3/8  BEN (MISCELLANEOUS)
CONN ST 1/4X3/8 BEN (MISCELLANEOUS) IMPLANT
CONN Y 3/8X3/8X3/8  BEN (MISCELLANEOUS)
CONN Y 3/8X3/8X3/8 BEN (MISCELLANEOUS) IMPLANT
CONT SPEC 4OZ CLIKSEAL STRL BL (MISCELLANEOUS) ×72 IMPLANT
COVER SURGICAL LIGHT HANDLE (MISCELLANEOUS) IMPLANT
DERMABOND ADHESIVE PROPEN (GAUZE/BANDAGES/DRESSINGS)
DERMABOND ADVANCED (GAUZE/BANDAGES/DRESSINGS) ×2
DERMABOND ADVANCED .7 DNX12 (GAUZE/BANDAGES/DRESSINGS) ×2 IMPLANT
DERMABOND ADVANCED .7 DNX6 (GAUZE/BANDAGES/DRESSINGS) IMPLANT
DRAIN CHANNEL 28F RND 3/8 FF (WOUND CARE) IMPLANT
DRAIN CHANNEL 32F RND 10.7 FF (WOUND CARE) IMPLANT
DRAPE LAPAROSCOPIC ABDOMINAL (DRAPES) ×4 IMPLANT
DRAPE SLUSH/WARMER DISC (DRAPES) ×4 IMPLANT
DRAPE WARM FLUID 44X44 (DRAPE) IMPLANT
ELECT BLADE 6.5 EXT (BLADE) ×4 IMPLANT
ELECT REM PT RETURN 9FT ADLT (ELECTROSURGICAL) ×4
ELECTRODE REM PT RTRN 9FT ADLT (ELECTROSURGICAL) ×2 IMPLANT
GAUZE SPONGE 4X4 12PLY STRL (GAUZE/BANDAGES/DRESSINGS) ×4 IMPLANT
GLOVE BIO SURGEON STRL SZ 6.5 (GLOVE) ×6 IMPLANT
GLOVE BIO SURGEON STRL SZ7.5 (GLOVE) ×4 IMPLANT
GLOVE BIO SURGEONS STRL SZ 6.5 (GLOVE) ×2
GLOVE SURG SIGNA 7.5 PF LTX (GLOVE) ×8 IMPLANT
GOWN STRL REUS W/ TWL LRG LVL3 (GOWN DISPOSABLE) ×6 IMPLANT
GOWN STRL REUS W/ TWL XL LVL3 (GOWN DISPOSABLE) ×2 IMPLANT
GOWN STRL REUS W/TWL LRG LVL3 (GOWN DISPOSABLE) ×6
GOWN STRL REUS W/TWL XL LVL3 (GOWN DISPOSABLE) ×2
HEMOSTAT SURGICEL 2X14 (HEMOSTASIS) IMPLANT
KIT BASIN OR (CUSTOM PROCEDURE TRAY) ×4 IMPLANT
KIT SUCTION CATH 14FR (SUCTIONS) IMPLANT
KIT TURNOVER KIT B (KITS) ×4 IMPLANT
NEEDLE HYPO 25GX1X1/2 BEV (NEEDLE) ×4 IMPLANT
NEEDLE SPNL 18GX3.5 QUINCKE PK (NEEDLE) ×4 IMPLANT
NEEDLE SPNL 22GX3.5 QUINCKE BK (NEEDLE) ×4 IMPLANT
NS IRRIG 1000ML POUR BTL (IV SOLUTION) ×12 IMPLANT
PACK CHEST (CUSTOM PROCEDURE TRAY) ×4 IMPLANT
PAD ARMBOARD 7.5X6 YLW CONV (MISCELLANEOUS) ×8 IMPLANT
POUCH ENDO CATCH II 15MM (MISCELLANEOUS) ×4 IMPLANT
POUCH SPECIMEN RETRIEVAL 10MM (ENDOMECHANICALS) ×4 IMPLANT
RELOAD STAPLER GOLD 60MM (STAPLE) ×10 IMPLANT
RELOAD STAPLER GREEN 60MM (STAPLE) ×2 IMPLANT
SCISSORS ENDO CVD 5DCS (MISCELLANEOUS) IMPLANT
SEALANT PROGEL (MISCELLANEOUS) IMPLANT
SEALANT SURG COSEAL 4ML (VASCULAR PRODUCTS) IMPLANT
SEALANT SURG COSEAL 8ML (VASCULAR PRODUCTS) IMPLANT
SHEARS HARMONIC HDI 20CM (ELECTROSURGICAL) ×4 IMPLANT
SOLUTION ANTI FOG 6CC (MISCELLANEOUS) ×4 IMPLANT
SPECIMEN JAR MEDIUM (MISCELLANEOUS) ×4 IMPLANT
SPONGE INTESTINAL PEANUT (DISPOSABLE) ×8 IMPLANT
SPONGE TONSIL TAPE 1 RFD (DISPOSABLE) ×4 IMPLANT
STAPLE ECHEON FLEX 60 POW ENDO (STAPLE) ×4 IMPLANT
STAPLE RELOAD 2.5MM WHITE (STAPLE) ×12 IMPLANT
STAPLER RELOAD GOLD 60MM (STAPLE) ×20
STAPLER RELOAD GREEN 60MM (STAPLE) ×4
STAPLER VASCULAR ECHELON 35 (CUTTER) ×4 IMPLANT
SUT PROLENE 4 0 RB 1 (SUTURE) ×2
SUT PROLENE 4-0 RB1 .5 CRCL 36 (SUTURE) ×2 IMPLANT
SUT SILK  1 MH (SUTURE) ×2
SUT SILK 1 MH (SUTURE) ×2 IMPLANT
SUT SILK 1 TIES 10X30 (SUTURE) ×4 IMPLANT
SUT SILK 2 0 SH (SUTURE) IMPLANT
SUT SILK 2 0SH CR/8 30 (SUTURE) IMPLANT
SUT SILK 3 0 SH 30 (SUTURE) IMPLANT
SUT SILK 3 0SH CR/8 30 (SUTURE) IMPLANT
SUT VIC AB 0 CTX 27 (SUTURE) IMPLANT
SUT VIC AB 1 CTX 27 (SUTURE) ×4 IMPLANT
SUT VIC AB 2-0 CT1 27 (SUTURE)
SUT VIC AB 2-0 CT1 TAPERPNT 27 (SUTURE) IMPLANT
SUT VIC AB 2-0 CTX 36 (SUTURE) ×4 IMPLANT
SUT VIC AB 3-0 MH 27 (SUTURE) IMPLANT
SUT VIC AB 3-0 SH 27 (SUTURE)
SUT VIC AB 3-0 SH 27X BRD (SUTURE) IMPLANT
SUT VIC AB 3-0 X1 27 (SUTURE) ×4 IMPLANT
SUT VICRYL 0 UR6 27IN ABS (SUTURE) IMPLANT
SUT VICRYL 2 TP 1 (SUTURE) IMPLANT
SYR 30ML LL (SYRINGE) ×4 IMPLANT
SYSTEM SAHARA CHEST DRAIN ATS (WOUND CARE) ×4 IMPLANT
TAPE CLOTH SURG 4X10 WHT LF (GAUZE/BANDAGES/DRESSINGS) ×4 IMPLANT
TIP APPLICATOR SPRAY EXTEND 16 (VASCULAR PRODUCTS) IMPLANT
TOWEL GREEN STERILE (TOWEL DISPOSABLE) ×4 IMPLANT
TOWEL GREEN STERILE FF (TOWEL DISPOSABLE) ×4 IMPLANT
TRAY FOLEY MTR SLVR 16FR STAT (SET/KITS/TRAYS/PACK) ×4 IMPLANT
TROCAR XCEL BLADELESS 5X75MML (TROCAR) ×4 IMPLANT
TROCAR XCEL NON-BLD 5MMX100MML (ENDOMECHANICALS) ×4 IMPLANT
TUBE CONNECTING 12'X1/4 (SUCTIONS) ×1
TUBE CONNECTING 12X1/4 (SUCTIONS) ×3 IMPLANT
WATER STERILE IRR 1000ML POUR (IV SOLUTION) ×8 IMPLANT
YANKAUER SUCT BULB TIP NO VENT (SUCTIONS) ×4 IMPLANT

## 2018-05-19 NOTE — Progress Notes (Signed)
Dr. Roxan Hockey notified of chest xray results.

## 2018-05-19 NOTE — Transfer of Care (Signed)
Immediate Anesthesia Transfer of Care Note  Patient: Monica [name]  Procedure(s) Performed: VIDEO ASSISTED THORACOSCOPY (VATS)/WEDGE RESECTION (Left Chest) LEFT LOWER LOBECTOMY (Left )  Patient Location: PACU  Anesthesia Type:General  Level of Consciousness: awake  Airway & Oxygen Therapy: Patient Spontanous Breathing and Patient connected to nasal cannula oxygen  Post-op Assessment: Report given to RN and Post -op Vital signs reviewed and stable  Post vital signs: Reviewed and stable  Last Vitals:  Vitals Value Taken Time  BP 115/70 05/19/2018 11:22 AM  Temp    Pulse 64 05/19/2018 11:26 AM  Resp 12 05/19/2018 11:26 AM  SpO2 94 % 05/19/2018 11:26 AM  Vitals shown include unvalidated device data.  Last Pain:  Vitals:   05/19/18 0715  TempSrc:   PainSc: 0-No pain      Patients Stated Pain Goal: 3 (55/01/58 6825)  Complications: No apparent anesthesia complications

## 2018-05-19 NOTE — Op Note (Signed)
NAME: Monica [name], POLSON MEDICAL RECORD [id number] ACCOUNT [id number] DATE OF BIRTH:1941-05-19 FACILITY: [facility] LOCATION: [facility] PHYSICIAN:Shandrell Boda Chaya Jan, MD  OPERATIVE REPORT  DATE OF PROCEDURE:  05/19/2018  PREOPERATIVE DIAGNOSIS:  Left lower lobe lung nodule, suspected stage IA nonsmall cell carcinoma (T1, N0).  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  PROCEDURE:   Left video-assisted thoracoscopy, Wedge resection left lower lobe nodule, Thoracoscopic left lower lobectomy,  Mediastinal lymph node dissection, Intercostal nerve block.  SURGEON:  Modesto Charon, MD.  ASSISTANT:  Jadene Pierini, PA  ANESTHESIA:  General.  FINDINGS:  Nodule palpable in the posterior aspect of the left lower lobe.  Frozen section revealed squamous cell carcinoma.  Bronchial margin was negative for tumor. Enlarged, but otherwise benign appearing lymph nodes.  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  She recently had a PET CT for followup of her MALT lymphoma and a new left lower lobe lung nodule was noted.  The nodule was hypermetabolic.   There was no evidence of mediastinal or hilar adenopathy.  She was advised to undergo left VATS for wedge resection.  If this was a nonsmall cell carcinoma, we would proceed with lobectomy for definitive treatment.  The indications, risks, benefits, and  alternatives were discussed in detail with the patient.  She understood and accepted the risks and agreed to proceed.  DESCRIPTION OF PROCEDURE:  The patient was brought to the preoperative holding area on 05/19/2018.  Anesthesia placed a central line and an arterial blood pressure monitoring line.  She was taken to the Operating Room, anesthetized and intubated with a  double lumen endotracheal tube.  Intravenous antibiotics were administered.  Sequential compression devices were  placed on the calves for DVT prophylaxis.  A Foley catheter was placed.  She was placed in a right lateral decubitus position and the left  chest was prepped and draped in the usual sterile fashion.  Single lung ventilation of the left lung was initiated and was tolerated well throughout the procedure.  A timeout was performed.  A solution containing 20 mL of liposomal bupivacaine, 30 mL of 0.5% bupivacaine and 50 mL of saline was used to locally anesthetize the incision sites as well as for the intercostal nerve blocks. An incision then was made in the seventh interspace in the mid axillary line, a 5 mm port was inserted.  The thoracoscope was advanced into the chest.  There was good isolation of the left lung.  There was no pleural effusion.  The site was selected for the  incision in the fourth interspace anterolaterally.  This area was infiltrated with the liposomal bupivacaine solution.  A 5 cm working incision was made.  No rib spreading was performed during the procedure.  The chest was entered.  The inferior ligament  was divided.  The nodule was palpated in the posterolateral aspect of the left lower lobe. A wedge resection was performed with sequential firings of an Echelon stapler using gold cartridges.  The specimen was sent for frozen section.  While awaiting  those results, the intercostal nerve blocks were performed.  10 mL of the  bupivacaine solution  was injected into each interspace from the 3rd to the 10th in a subpleural plane.  The frozen section returned showing squamous cell carcinoma.  The  decision was made to proceed with lobectomy as discussed with the patient preoperatively.  The fissure  was relatively complete.  The pleural reflection was divided at the hilum posteriorly and anteriorly using the Harmonic scalpel.  Anteriorly, the  phrenic nerve was identified and preserved.  A small portion of the fissure was incomplete anteriorly.  This was divided with the Echelon stapler.  A  plane was developed over the pulmonary artery and carried posteriorly.  Posteriorly there was also a  small segment of the fissure that was not complete.  This was also divided with the Echelon stapler.  The inferior pulmonary vein was dissected out and divided with the endoscopic vascular stapler.  The superior segmental branch to the lower lobe was  dissected out and divided with a vascular stapler as well.  Finally, the basilar segmental branch was divided.  Nodes along the bronchus were removed and sent for permanent pathology.  The Echelon stapler with a green cartridge was placed across the left  lower lobe bronchus at its origin.  It was closed.  A test inflation showed good aeration of the upper lobe.  The stapler then was fired transecting the bronchus.  The lower lobe was removed and sent for frozen section of the bronchial margin which  subsequently returned with no tumor seen.  Level seven nodes were removed posteriorly. Anteriorly the aortopulmonary window was opened and level five nodes were removed using the Harmonic scalpel.  The chest was copiously irrigated with warm saline.   A test inflation to 30 cm of water revealed no significant leakage.  There was no leakage from the bronchial stump.  There were minimal bubbles from the staple lines.  A 28 French chest tube was placed through the original port incision and secured  with #1 silk suture.  The left upper lobe was reinflated.  The working incision was closed in standard fashion.  Chest tube was placed to suction.  The patient was placed back in the supine position.  She was extubated in the operating room and taken to  the [facility] in good condition.  TN/NUANCE  D:05/19/2018 T:05/19/2018 JOB:[id number]

## 2018-05-19 NOTE — Interval H&P Note (Signed)
History and Physical Interval Note: PFTs OK 05/19/2018 8:11 AM  Monica [name]  has presented today for surgery, with the diagnosis of Left Lower Lobe Lung Nodule  The various methods of treatment have been discussed with the patient and family. After consideration of risks, benefits and other options for treatment, the patient has consented to  Procedure(s): VIDEO ASSISTED THORACOSCOPY (VATS)/WEDGE RESECTION (Left) LOBECTOMY (Left) as a surgical intervention .  The patient's history has been reviewed, patient examined, no change in status, stable for surgery.  I have reviewed the patient's chart and labs.  Questions were answered to the patient's satisfaction.     [name]

## 2018-05-19 NOTE — Progress Notes (Signed)
Patient stated her bag of clothes was missing. PACU called - they stated they had no bag of clothes for this patient. Will notify patient and charge RN.  Monica Reef Xitlalic Maslin,RN

## 2018-05-19 NOTE — Progress Notes (Signed)
Pt states a prescription was called in for her UTI by her urologist on 05/17/18.

## 2018-05-19 NOTE — Anesthesia Procedure Notes (Signed)
Central Venous Catheter Insertion Performed by: Duane Boston, MD, anesthesiologist Start/End7/19/2019 7:54 AM, 05/19/2018 8:04 AM Patient location: Pre-op. Preanesthetic checklist: patient identified, IV checked, site marked, risks and benefits discussed, surgical consent, monitors and equipment checked, pre-op evaluation, timeout performed and anesthesia consent Position: Trendelenburg Lidocaine 1% used for infiltration and patient sedated Hand hygiene performed , maximum sterile barriers used  and Seldinger technique used Catheter size: 8 Fr Total catheter length 16. Central line was placed.Double lumen Procedure performed without using ultrasound guided technique. Attempts: 1 Following insertion, dressing applied, line sutured and Biopatch. Post procedure assessment: blood return through all ports, free fluid flow and no air  Patient tolerated the procedure well with no immediate complications.

## 2018-05-19 NOTE — Anesthesia Procedure Notes (Signed)
Arterial Line Insertion Start/End7/19/2019 7:30 AM, 05/19/2018 7:30 AM Performed by: Clearnce Sorrel, CRNA  Patient location: Pre-op. Preanesthetic checklist: patient identified, IV checked, site marked, risks and benefits discussed, surgical consent, monitors and equipment checked, pre-op evaluation, timeout performed and anesthesia consent Lidocaine 1% used for infiltration Right, radial was placed Catheter size: 20 Fr Hand hygiene performed  and maximum sterile barriers used   Attempts: 1 Procedure performed without using ultrasound guided technique. Following insertion, dressing applied. Post procedure assessment: normal and unchanged

## 2018-05-19 NOTE — Anesthesia Procedure Notes (Signed)
Procedure Name: Intubation Date/Time: 05/19/2018 8:40 AM Performed by: Clearnce Sorrel, CRNA Pre-anesthesia Checklist: Patient identified, Emergency Drugs available, Suction available, Patient being monitored and Timeout performed Patient Re-evaluated:Patient Re-evaluated prior to induction Oxygen Delivery Method: Circle system utilized Preoxygenation: Pre-oxygenation with 100% oxygen Induction Type: IV induction Ventilation: Mask ventilation without difficulty Laryngoscope Size: Miller and 3 Grade View: Grade I Tube type: Oral Endobronchial tube: EBT position confirmed by fiberoptic bronchoscope, EBT position confirmed by auscultation, Left and Double lumen EBT and 37 Fr Number of attempts: 1 Airway Equipment and Method: Stylet Placement Confirmation: ETT inserted through vocal cords under direct vision,  positive ETCO2 and breath sounds checked- equal and bilateral Tube secured with: Tape Dental Injury: Teeth and Oropharynx as per pre-operative assessment

## 2018-05-19 NOTE — Brief Op Note (Addendum)
05/19/2018  11:01 AM  PATIENT:  Monica [name]  77 y.o. female  PRE-OPERATIVE DIAGNOSIS:  Left Lower Lobe Lung Nodule, Probable Stage IA T1,N0 NSCCA  POST-OPERATIVE DIAGNOSIS:  Clinical Stage IA (T1N0) Squamous cell carcinoma  PROCEDURE:  Procedure(s): VIDEO ASSISTED THORACOSCOPY (VATS)/WEDGE RESECTION (Left) LEFT LOWER LOBECTOMY (Left)  MEDIASTINAL LYMPH NODE DISSECTION INTERCOSTAL NERVE BLOCK  SURGEON:  Surgeon(s) and Role:    * [name], MD - Primary  PHYSICIAN ASSISTANT: WAYNE GOLD PA-C  ANESTHESIA:   general  EBL:  100 mL   BLOOD ADMINISTERED:none  DRAINS: 1 28 F Chest Tube(s) in the LEFT HEMITHORAX   LOCAL MEDICATIONS USED:  BUPIVICAINE   SPECIMEN:  Source of Specimen:  LEFT LOWER LOBECTOMY, LN SAMPLES  DISPOSITION OF SPECIMEN:  PATHOLOGY  COUNTS:  YES  TOURNIQUET:  * No tourniquets in log *  DICTATION: .Other Dictation: Dictation Number [id number]  PLAN OF CARE: Admit to inpatient   PATIENT DISPOSITION:  ICU - intubated and hemodynamically stable.   Delay start of Pharmacological VTE agent (>24hrs) due to surgical blood loss or risk of bleeding: yes  FROZEN: SQUAMOUS CELL CARCINOMA, NEG MARGINS

## 2018-05-19 NOTE — Anesthesia Postprocedure Evaluation (Signed)
Anesthesia Post Note  Patient: Monica [name]  Procedure(s) Performed: VIDEO ASSISTED THORACOSCOPY (VATS)/WEDGE RESECTION (Left Chest) LEFT LOWER LOBECTOMY (Left )     Patient location during evaluation: PACU Anesthesia Type: General Level of consciousness: sedated Pain management: pain level controlled Vital Signs Assessment: post-procedure vital signs reviewed and stable Respiratory status: spontaneous breathing and respiratory function stable Cardiovascular status: stable Postop Assessment: no apparent nausea or vomiting Anesthetic complications: no    Last Vitals:  Vitals:   05/19/18 1233 05/19/18 1250  BP:  (!) 122/58  Pulse:  61  Resp: (!) 22 10  Temp:    SpO2:  93%    Last Pain:  Vitals:   05/19/18 1233  TempSrc:   PainSc: 3                  Laberta Wilbon DANIEL

## 2018-05-20 ENCOUNTER — Inpatient Hospital Stay (HOSPITAL_COMMUNITY): Payer: Medicare Other

## 2018-05-20 ENCOUNTER — Encounter (HOSPITAL_COMMUNITY): Payer: Self-pay | Admitting: Thoracic Surgery (Cardiothoracic Vascular Surgery)

## 2018-05-20 LAB — BLOOD GAS, ARTERIAL
Acid-Base Excess: 0.3 mmol/L (ref 0.0–2.0)
BICARBONATE: 24.7 mmol/L (ref 20.0–28.0)
Drawn by: 511471
O2 Content: 2 L/min
O2 Saturation: 95.5 %
PCO2 ART: 42.3 mmHg (ref 32.0–48.0)
PO2 ART: 77.6 mmHg — AB (ref 83.0–108.0)
Patient temperature: 98.6
pH, Arterial: 7.385 (ref 7.350–7.450)

## 2018-05-20 LAB — BASIC METABOLIC PANEL
ANION GAP: 10 (ref 5–15)
BUN: 11 mg/dL (ref 8–23)
CHLORIDE: 99 mmol/L (ref 98–111)
CO2: 26 mmol/L (ref 22–32)
Calcium: 8.5 mg/dL — ABNORMAL LOW (ref 8.9–10.3)
Creatinine, Ser: 0.72 mg/dL (ref 0.44–1.00)
GFR calc Af Amer: >60 mL/min (ref >60)
GFR calc non Af Amer: >60 mL/min (ref >60)
GLUCOSE: 135 mg/dL — AB (ref 70–99)
POTASSIUM: 4.3 mmol/L (ref 3.5–5.1)
Sodium: 135 mmol/L (ref 135–145)

## 2018-05-20 LAB — CBC
HEMATOCRIT: 32.5 % — AB (ref 36.0–46.0)
HEMOGLOBIN: 10.3 g/dL — AB (ref 12.0–15.0)
MCH: 28.5 pg (ref 26.0–34.0)
MCHC: 31.7 g/dL (ref 30.0–36.0)
MCV: 90 fL (ref 78.0–100.0)
Platelets: 212 10*3/uL (ref 150–400)
RBC: 3.61 MIL/uL — ABNORMAL LOW (ref 3.87–5.11)
RDW: 14.5 % (ref 11.5–15.5)
WBC: 13 10*3/uL — ABNORMAL HIGH (ref 4.0–10.5)

## 2018-05-20 LAB — GLUCOSE, CAPILLARY
GLUCOSE-CAPILLARY: 150 mg/dL — AB (ref 70–99)
GLUCOSE-CAPILLARY: 104 mg/dL — AB (ref 70–99)
Glucose-Capillary: 127 mg/dL — ABNORMAL HIGH (ref 70–99)
Glucose-Capillary: 127 mg/dL — ABNORMAL HIGH (ref 70–99)

## 2018-05-20 MED ORDER — MENTHOL 3 MG MT LOZG
1.0000 | LOZENGE | OROMUCOSAL | Status: DC | PRN
  Administered 2018-05-20: 3 mg via ORAL
  Filled 2018-05-20: qty 9

## 2018-05-20 MED ORDER — DM-GUAIFENESIN ER 30-600 MG PO TB12
1.0000 | ORAL_TABLET | Freq: Two times a day (BID) | ORAL | Status: DC
  Administered 2018-05-20 – 2018-05-23 (×7): 1 via ORAL
  Filled 2018-05-20 (×7): qty 1

## 2018-05-20 NOTE — Progress Notes (Addendum)
      [address]       [place] [address]             [phone]      1 Day Post-Op Procedure(s) (LRB): VIDEO ASSISTED THORACOSCOPY (VATS)/WEDGE RESECTION (Left) LEFT LOWER LOBECTOMY (Left)   Subjective:  States she is fair.  Has pain but medication is providing relief.  She has a forceful productive cough which was present prior to surgery.  Denies N/V, but didn't care for liquid diet.  Objective: Vital signs in last 24 hours: Temp:  [97 F (36.1 C)-98.7 F (37.1 C)] 97.7 F (36.5 C) (07/20 0740) Pulse Rate:  [60-72] 61 (07/20 0740) Cardiac Rhythm: Normal sinus rhythm (07/20 0742) Resp:  [9-26] 14 (07/20 0818) BP: (89-143)/(53-75) 128/57 (07/20 0740) SpO2:  [92 %-98 %] 98 % (07/20 0818) Arterial Line BP: (156-184)/(43-63) 156/52 (07/19 1300) Weight:  [166 lb 0.1 oz (75.3 kg)] 166 lb 0.1 oz (75.3 kg) (07/20 0500)  Intake/Output from previous day: 07/19 0701 - 07/20 0700 In: 2969.4 [I.V.:2719.4; IV Piggyback:250] Out: 1351 [Urine:955; Blood:100; Chest Tube:296]  General appearance: alert, cooperative and no distress Heart: regular rate and rhythm Lungs: clear to auscultation bilaterally Abdomen: soft, non-tender; bowel sounds normal; no masses,  no organomegaly Extremities: extremities normal, atraumatic, no cyanosis or edema Wound: clean and dry  Lab Results: Recent Labs    05/17/18 1344 05/20/18 0610  WBC 8.0 13.0*  HGB 12.9 10.3*  HCT 41.0 32.5*  PLT 273 212   BMET:  Recent Labs    05/19/18 1521 05/20/18 0610  NA 139 135  K 4.6 4.3  CL 104 99  CO2 26 26  GLUCOSE 167* 135*  BUN 13 11  CREATININE 0.80 0.72  CALCIUM 8.6* 8.5*    PT/INR:  Recent Labs    05/17/18 1344  LABPROT 12.9  INR 0.98   ABG    Component Value Date/Time   PHART 7.385 05/20/2018 0419   HCO3 24.7 05/20/2018 0419   O2SAT 95.5 05/20/2018 0419   CBG (last 3)  Recent Labs    05/19/18 1124 05/19/18 1657 05/19/18 2337  GLUCAP 159* 159* 143*     Assessment/Plan: S/P Procedure(s) (LRB): VIDEO ASSISTED THORACOSCOPY (VATS)/WEDGE RESECTION (Left) LEFT LOWER LOBECTOMY (Left)  1. Chest tube- 296 ml of bloody drainage, no air leak present with cough- will try chest tube on water seal today 2. Pulm- underlying COPD, + productive cough, CXR with questionable tiny pneumothorax, continue IS, nebs scheduled, add Mucinex prn 3. CV- NSR, mild HTN- on home regimen of Lopressor, will monitor and if remains elevated will start low antihypertensive agent 4. DM- sugars okay, on home Metformin, continue SSIP 5. D/C Arterial Line 6. IV Fluids to KVO 7. Lovenox for DVT prophylaxis 8. DIspo- patient stable, d/c arterial line, IV fluids to KVO, chest tube to water seal, Mucinex added for cough, repeat CXR in AM   LOS: 1 day    Erin Barrett 05/20/2018 patient examined and medical record reviewed,agree with above note. Tharon Aquas Trigt III 05/20/2018

## 2018-05-21 ENCOUNTER — Inpatient Hospital Stay (HOSPITAL_COMMUNITY): Payer: Medicare Other

## 2018-05-21 LAB — GLUCOSE, CAPILLARY
GLUCOSE-CAPILLARY: 126 mg/dL — AB (ref 70–99)
GLUCOSE-CAPILLARY: 130 mg/dL — AB (ref 70–99)
Glucose-Capillary: 86 mg/dL (ref 70–99)
Glucose-Capillary: 155 mg/dL — ABNORMAL HIGH (ref 70–99)
Glucose-Capillary: 186 mg/dL — ABNORMAL HIGH (ref 70–99)

## 2018-05-21 LAB — COMPREHENSIVE METABOLIC PANEL
ALT: 17 U/L (ref 0–44)
AST: 23 U/L (ref 15–41)
Albumin: 2.9 g/dL — ABNORMAL LOW (ref 3.5–5.0)
Alkaline Phosphatase: 44 U/L (ref 38–126)
Anion gap: 7 (ref 5–15)
BILIRUBIN TOTAL: 0.6 mg/dL (ref 0.3–1.2)
BUN: 8 mg/dL (ref 8–23)
CO2: 29 mmol/L (ref 22–32)
Calcium: 8.4 mg/dL — ABNORMAL LOW (ref 8.9–10.3)
Chloride: 103 mmol/L (ref 98–111)
Creatinine, Ser: 0.7 mg/dL (ref 0.44–1.00)
Glucose, Bld: 122 mg/dL — ABNORMAL HIGH (ref 70–99)
POTASSIUM: 4.5 mmol/L (ref 3.5–5.1)
Sodium: 139 mmol/L (ref 135–145)
TOTAL PROTEIN: 5.8 g/dL — AB (ref 6.5–8.1)

## 2018-05-21 LAB — CBC
HCT: 32.8 % — ABNORMAL LOW (ref 36.0–46.0)
Hemoglobin: 10.3 g/dL — ABNORMAL LOW (ref 12.0–15.0)
MCH: 28.6 pg (ref 26.0–34.0)
MCHC: 31.4 g/dL (ref 30.0–36.0)
MCV: 91.1 fL (ref 78.0–100.0)
Platelets: 202 10*3/uL (ref 150–400)
RBC: 3.6 MIL/uL — ABNORMAL LOW (ref 3.87–5.11)
RDW: 14.6 % (ref 11.5–15.5)
WBC: 8 10*3/uL (ref 4.0–10.5)

## 2018-05-21 NOTE — Progress Notes (Signed)
Wasted fentanyl 5 ml at the sink and witness by [employer]. HS [employer]

## 2018-05-21 NOTE — Progress Notes (Signed)
Heart rate dropped into 30's BP 96/79 asymptomatic and EKG done HR rate up to 50-60's. MD notified order received to d/c metoprolol. I will continue to monitor.

## 2018-05-21 NOTE — Progress Notes (Addendum)
      [address]       [place] [address]             [phone]      2 Days Post-Op Procedure(s) (LRB): VIDEO ASSISTED THORACOSCOPY (VATS)/WEDGE RESECTION (Left) LEFT LOWER LOBECTOMY (Left)   Subjective:  No new complaints.  Continues to have productive cough, but breathing treatments help.  Episode of Bradycardia overnight.  + ambulation  No BM  Objective: Vital signs in last 24 hours: Temp:  [97.8 F (36.6 C)-99.3 F (37.4 C)] 98.1 F (36.7 C) (07/21 0747) Pulse Rate:  [60-70] 60 (07/21 0747) Cardiac Rhythm: Normal sinus rhythm (07/21 0700) Resp:  [13-22] 15 (07/21 0802) BP: (96-135)/(49-79) 129/61 (07/21 0747) SpO2:  [94 %-100 %] 94 % (07/21 0937)  Intake/Output from previous day: 07/20 0701 - 07/21 0700 In: 1506.2 [P.O.:480; I.V.:1026.2] Out: 3475 [Urine:3475]  General appearance: alert, cooperative and no distress Heart: regular rate and rhythm Lungs: clear to auscultation bilaterally Abdomen: soft, non-tender; bowel sounds normal; no masses,  no organomegaly Extremities: extremities normal, atraumatic, no cyanosis or edema Wound: clean and dry  Lab Results: Recent Labs    05/20/18 0610 05/21/18 0218  WBC 13.0* 8.0  HGB 10.3* 10.3*  HCT 32.5* 32.8*  PLT 212 202   BMET:  Recent Labs    05/20/18 0610 05/21/18 0218  NA 135 139  K 4.3 4.5  CL 99 103  CO2 26 29  GLUCOSE 135* 122*  BUN 11 8  CREATININE 0.72 0.70  CALCIUM 8.5* 8.4*    PT/INR: No results for input(s): LABPROT, INR in the last 72 hours. ABG    Component Value Date/Time   PHART 7.385 05/20/2018 0419   HCO3 24.7 05/20/2018 0419   O2SAT 95.5 05/20/2018 0419   CBG (last 3)  Recent Labs    05/20/18 1704 05/21/18 0052 05/21/18 0620  GLUCAP 104* 126* 130*    Assessment/Plan: S/P Procedure(s) (LRB): VIDEO ASSISTED THORACOSCOPY (VATS)/WEDGE RESECTION (Left) LEFT LOWER LOBECTOMY (Left)  1. Chest tube- no air leak, minimal output recorded yesterday- will d/c  chest tube today 2. Pulm - + COPD, down to 1L of oxygen, continue to wean as tolerated, continue IS, nebs 3. CV-Bradycardia overnight, home Lopressor discontinued, BP stable 4. DM- sugars controlled, diet being advanced today 5. dispo- patient stable, d/c chest tube today, Bradycardia overnight and Lopressor discontinued, repeat CXR in AM if remains stable, possibly ready for d/c   LOS: 2 days    Ellwood Handler 05/21/2018  doing well chest tube out Incision clean,dry Weaning O2 patient examined and medical record reviewed,agree with above note. Tharon Aquas Trigt III 05/21/2018

## 2018-05-22 ENCOUNTER — Inpatient Hospital Stay (HOSPITAL_COMMUNITY): Payer: Medicare Other

## 2018-05-22 LAB — GLUCOSE, CAPILLARY
GLUCOSE-CAPILLARY: 143 mg/dL — AB (ref 70–99)
GLUCOSE-CAPILLARY: 146 mg/dL — AB (ref 70–99)
GLUCOSE-CAPILLARY: 136 mg/dL — AB (ref 70–99)
Glucose-Capillary: 123 mg/dL — ABNORMAL HIGH (ref 70–99)
Glucose-Capillary: 135 mg/dL — ABNORMAL HIGH (ref 70–99)
Glucose-Capillary: 175 mg/dL — ABNORMAL HIGH (ref 70–99)

## 2018-05-22 MED ORDER — AMIODARONE HCL 200 MG PO TABS: 200.0000 mg | ORAL_TABLET | Freq: Two times a day (BID) | ORAL | Status: DC

## 2018-05-22 MED ORDER — AMIODARONE HCL 200 MG PO TABS
400.0000 mg | ORAL_TABLET | Freq: Two times a day (BID) | ORAL | Status: DC
  Administered 2018-05-22 – 2018-05-23 (×3): 400 mg via ORAL
  Filled 2018-05-22 (×3): qty 2

## 2018-05-22 MED ORDER — INSULIN ASPART 100 UNIT/ML ~~LOC~~ SOLN: 0.0000 [IU] | Freq: Three times a day (TID) | SUBCUTANEOUS | Status: DC

## 2018-05-22 MED ORDER — METOPROLOL SUCCINATE ER 25 MG PO TB24
25.0000 mg | ORAL_TABLET | Freq: Every day | ORAL | Status: DC
  Administered 2018-05-22 – 2018-05-23 (×2): 25 mg via ORAL
  Filled 2018-05-22 (×2): qty 1

## 2018-05-22 NOTE — Progress Notes (Addendum)
      [address]       ,[place] [address]             [phone]      3 Days Post-Op Procedure(s) (LRB): VIDEO ASSISTED THORACOSCOPY (VATS)/WEDGE RESECTION (Left) LEFT LOWER LOBECTOMY (Left) Subjective: Feels okay this morning. Her appetite is coming back  Objective: Vital signs in last 24 hours: Temp:  [97.6 F (36.4 C)-98.9 F (37.2 C)] 98.5 F (36.9 C) (07/22 0452) Pulse Rate:  [60-99] 92 (07/22 0452) Cardiac Rhythm: Atrial fibrillation (07/22 0045) Resp:  [15-22] 22 (07/22 0452) BP: (113-169)/(56-92) 169/92 (07/22 0452) SpO2:  [94 %-96 %] 96 % (07/22 0452)     Intake/Output from previous day: 07/21 0701 - 07/22 0700 In: 540.9 [P.O.:422; I.V.:118.9] Out: -  Intake/Output this shift: No intake/output data recorded.  General appearance: alert, cooperative and no distress Heart: irregularly irregular rhythm Lungs: clear to auscultation bilaterally Abdomen: soft, non-tender; bowel sounds normal; no masses,  no organomegaly Extremities: extremities normal, atraumatic, no cyanosis or edema Wound: clean and dry  Lab Results: Recent Labs    05/20/18 0610 05/21/18 0218  WBC 13.0* 8.0  HGB 10.3* 10.3*  HCT 32.5* 32.8*  PLT 212 202   BMET:  Recent Labs    05/20/18 0610 05/21/18 0218  NA 135 139  K 4.3 4.5  CL 99 103  CO2 26 29  GLUCOSE 135* 122*  BUN 11 8  CREATININE 0.72 0.70  CALCIUM 8.5* 8.4*    PT/INR: No results for input(s): LABPROT, INR in the last 72 hours. ABG    Component Value Date/Time   PHART 7.385 05/20/2018 0419   HCO3 24.7 05/20/2018 0419   O2SAT 95.5 05/20/2018 0419   CBG (last 3)  Recent Labs    05/21/18 2021 05/22/18 0028 05/22/18 0634  GLUCAP 186* 123* 143*    Assessment/Plan: S/P Procedure(s) (LRB): VIDEO ASSISTED THORACOSCOPY (VATS)/WEDGE RESECTION (Left) LEFT LOWER LOBECTOMY (Left)  1. CV-in rate controlled atrial fibrillation this morning, rate in the 90s-low 100s. Beta blocker discontinued due  to bradycardia and second degree AVB. QTc 439. BP elevated. Will start Amio oral 200mg  BID.  2. Pulm-chest xray appears stable when compared to yesterdays study. Tolerating 1L Mono for oxygen support. Chest tube removed yesterday.  3. Renal-creatinine 0.70, electrolytes okay 4. H and H is stable at 10.3/32.8 5. Endo-blood glucose level is well controlled  Plan: Work on rhythm control today. Ambulate around the unit. Encouraged incentive spirometer use. Wean oxygen as tolerated.     LOS: 3 days    Elgie Collard 05/22/2018 [place] after going to bathroom In atrial fib in 120s- will increase oral amiodarone to 400 BID Restart Toprol 25 mg daily CXR OK Path pending  Remo Lipps C. Roxan Hockey, MD [facility] [phone]

## 2018-05-23 ENCOUNTER — Other Ambulatory Visit: Payer: Self-pay | Admitting: Cardiology

## 2018-05-23 DIAGNOSIS — I480 Paroxysmal atrial fibrillation: Secondary | ICD-10-CM

## 2018-05-23 DIAGNOSIS — Z902 Acquired absence of lung [part of]: Secondary | ICD-10-CM

## 2018-05-23 LAB — GLUCOSE, CAPILLARY
Glucose-Capillary: 172 mg/dL — ABNORMAL HIGH (ref 70–99)
Glucose-Capillary: 106 mg/dL — ABNORMAL HIGH (ref 70–99)

## 2018-05-23 MED ORDER — AMIODARONE HCL 200 MG PO TABS: ORAL_TABLET | ORAL | 1 refills | Status: DC

## 2018-05-23 MED ORDER — METOPROLOL SUCCINATE ER 25 MG PO TB24: 25.0000 mg | ORAL_TABLET | Freq: Every day | ORAL | 1 refills | Status: DC

## 2018-05-23 MED ORDER — ACETAMINOPHEN 500 MG PO TABS: 1000.0000 mg | ORAL_TABLET | Freq: Four times a day (QID) | ORAL | 0 refills | Status: DC

## 2018-05-23 MED ORDER — OXYCODONE HCL 5 MG PO TABS: 5.0000 mg | ORAL_TABLET | Freq: Four times a day (QID) | ORAL | 0 refills | Status: DC | PRN

## 2018-05-23 MED ORDER — GUAIFENESIN ER 600 MG PO TB12
1200.0000 mg | ORAL_TABLET | Freq: Two times a day (BID) | ORAL | Status: DC
  Administered 2018-05-23: 600 mg via ORAL
  Filled 2018-05-23: qty 2

## 2018-05-23 NOTE — Care Management Important Message (Signed)
Important Message  Patient Details  Name: Monica [name] MRN: [id number] Date of Birth: 1940-11-04   Medicare Important Message Given:  Yes    Nida Manfredi P Azra Abrell 05/23/2018, 3:09 PM

## 2018-05-23 NOTE — Progress Notes (Addendum)
Dressing changed done to surgical site.  Old gauze pad have small amount of serous drainage with suture intact.  Mild tenderness when touched or with activity.

## 2018-05-23 NOTE — Discharge Summary (Addendum)
Physician Discharge Summary  Patient ID: Monica [name] MRN: [id number] DOB/AGE: 11/10/1940 77 y.o.  Admit date: 05/19/2018 Discharge date: 05/23/2018  Admission Diagnoses:  Left lower lobe lung nodule (T1N0) Patient Active Problem List   Diagnosis Date Noted  . Early satiety 05/02/2018  . Colon cancer screening 05/02/2018  . Flatulence/gas pain/belching 09/16/2017  . Lower abdominal pain 09/16/2017  . Nodule of left lung 01/07/2017  . Nausea without vomiting 04/06/2016  . Loss of weight 04/06/2016  . 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 Mucosal abnormality of stomach   . Schatzki's ring   . Hiatal hernia   . HYPERCHOLESTEROLEMIA 09/02/2010  . DEPRESSION, MILD 09/02/2010  . TACHYCARDIA 09/02/2010  . 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  She has been smoking since age 54.  She has smoked a pack a day the majority of the time.  She continues to smoke.  Was diagnosed with a gastric MALT lymphoma 2016.  She was treated with radiation.  She recently saw Dr. Walden Field for follow-up.  A PET/CT was done for follow-up.  It showed some activity but no mass in the gastric antrum.  It also showed a new 1.3 cm left lower lobe nodule that was markedly hypermetabolic SUV of 4.6.  There is no mediastinal or hilar adenopathy.  She is better her usual state of health.  She has not had any change in appetite or weight loss.  She has had difficulty swallowing.  She recently saw GI and is scheduled for an esophageal dilatation in August.  She gets short of breath with heavy activity but can walk up a flight of stairs without difficulty.  She denies  chest pain, pressure, or tightness.  She has not had any unusual headaches or visual changes.    Hospital Course:  Monica [name] was brought to the operating room in stable condition and on 05/19/2018 underwent a left VATS procedure, wedge resection of a left lower lobe nodule, and left lower lobectomy with mediastinal lymph node dissection with Dr. Roxan Hockey.  She tolerated the procedure well and was transferred to the PACU.  She was extubated timely manner.  Postop day 1 we changed her chest tube to waterseal.  She continued to have a productive cough and Mucinex was initiated.  She did have some mild hypertension therefore we restarted her Lopressor.  We discontinued her arterial line and plan to repeat an x-ray.  Postop day 2 she was having sinus bradycardia therefore we discontinued her Lopressor.  We weaned her oxygen requirement down as tolerated.  We discontinued her chest tube.  Postop day 3 she was found to be in rate controlled atrial fibrillation.  Amiodarone 400 mg twice daily control was ordered.  Her H&H remained stable.  She remained on 1 L nasal cannula for oxygen support.  We continue to encourage ambulation and use of her incentive spirometer.  Today, we will plan to do a 6-minute walk test to evaluate for home oxygen needs.  She is now in normal sinus rhythm with a rate in the 60s to 70s.  She did have an episode of bradycardia in the 30s last night which was asymptomatic. Cardiology came  to seen the patient and recommended Amiodarone 200mg  BID for two weeks then 200mg  daily. They also wanted the patient off the beta blocker due to her bradycardia. She is to follow-up in their office outpatient for further rhythm management. She was cleared for discharge by cardiology and  is stable for discharge today.   Consults: None  Significant Diagnostic Studies:   CLINICAL DATA:  Follow-up chest tube removal  EXAM: CHEST - 2 VIEW  COMPARISON:  05/21/2018  FINDINGS: Cardiac shadow  is stable. Aortic calcifications are again seen. Left subclavian central line is again noted and stable. Some mild subcutaneous emphysema is noted on the left related to the prior chest tube. No definitive recurrent pneumothorax is noted following chest tube removal. Mild bibasilar atelectatic changes are seen. No acute bony abnormality is noted.  IMPRESSION: Mild bibasilar atelectasis relatively stable from the prior exam. No significant recurrent pneumothorax is noted at this time.   Electronically Signed   By: Inez Catalina M.D.   On: 05/22/2018 07:49  Treatments:  NAME: Monica [name], Monica [name] MEDICAL RECORD [id number] ACCOUNT [id number] DATE OF BIRTH:[birth year] FACILITY: [facility] LOCATION: [facility] PHYSICIAN:Rameses Ou Chaya Jan, MD  OPERATIVE REPORT  DATE OF PROCEDURE:  05/19/2018  PREOPERATIVE DIAGNOSIS:  Left lower lobe lung nodule suspected stage IA nonsmall cell carcinoma (T1, N0).  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  PROCEDURE:  Left video-assisted thoracoscopy, wedge resection, left lower lobe nodule, left lower lobectomy, mediastinal lymph node dissection, intercostal nerve block.  SURGEON:  Modesto Charon, MD.  ASSISTANT:  Jadene Pierini, PA  ANESTHESIA:  General.  FINDINGS:  A nodule palpable in the posterior aspect of the left lower lobe.  Frozen section revealed squamous cell carcinoma.  Bronchial margin was negative for tumor enlarged, but otherwise benign appearing lymph nodes.    Pathology pending  Discharge Exam: Blood pressure 136/66, pulse 65, temperature 98.2 F (36.8 C), temperature source Oral, resp. rate (!) 23, height 5\' 6"  (1.676 m), weight 75.3 kg (166 lb 0.1 oz), SpO2 95 %.   General appearance: alert, cooperative and no distress Heart: regular rate and rhythm, S1, S2 normal, no murmur, click, rub or gallop Lungs: clear to auscultation bilaterally Abdomen: soft,  non-tender; bowel sounds normal; no masses,  no organomegaly Extremities: extremities normal, atraumatic, no cyanosis or edema Wound: clean and dry    Disposition: Discharge disposition: 01-Home or Self Care        Allergies as of 05/23/2018      Reactions   Sulfonamide Derivatives Hives   Sulfur       Medication List    STOP taking these medications   metoprolol tartrate 25 MG tablet Commonly known as:  LOPRESSOR     TAKE these medications   acetaminophen 500 MG tablet Commonly known as:  TYLENOL Take 2 tablets (1,000 mg total) by mouth every 6 (six) hours.   amiodarone 200 MG tablet Commonly known as:  PACERONE Take 1 tab (200 mg) twice a day for 2 weeks then take 1 tab (200 mg) thereafter for a month or until seen by cardiology.   aspirin 81 MG tablet Take 81 mg by mouth at bedtime.   CALCIUM 500 PO Take 500 mg by mouth at bedtime.   citalopram 20 MG tablet Commonly known as:  CELEXA Take 20 mg by mouth daily.   doxycycline 100 MG capsule Commonly known as:  VIBRAMYCIN TAKE 1 CAPSULE BY MOUTH EVERY OTHER DAY BEFORE BED AND  TAKE CALCIUM CITRATE ON OPPOSITE DAYS   metFORMIN 500 MG 24 hr tablet Commonly known as:  GLUCOPHAGE-XR Take 500 mg 2 (two) times daily by mouth.   oxyCODONE 5 MG immediate release tablet Commonly known as:  Oxy IR/ROXICODONE Take 1 tablet (5 mg total) by mouth every 6 (six) hours as needed for severe pain.   pantoprazole 40 MG tablet Commonly known as:  PROTONIX Take 1 tablet (40 mg total) by mouth daily before breakfast.   pravastatin 40 MG tablet Commonly known as:  PRAVACHOL Take 40 mg by mouth at bedtime.   SOOTHE XP Soln Place 2 drops into both eyes daily as needed (dry eyes).      Follow-up Information    Sasser, Silvestre Moment, MD. Call in 1 day(s).   Specialty:  Family Medicine Contact information: [place] [address] [phone]        [name], MD Follow up.   Specialty:  Cardiothoracic  Surgery Why:  Your routine follow-up appointment is on 06/13/2018 at 12:15pm. Please arrive at 11:45am for a chest xray located at [facility] which is on the first floor of our building.  Contact information: [place] [address] [phone]        Jerline Pain, MD. Call in 1 day(s).   Specialty:  Cardiology Why:  Their office should call to arrange an appointment but if you have not heard from them please call in 2 days.  Contact information: [address] [place] [address] [phone]           Signed: Elgie Collard 05/23/2018, 2:56 PM

## 2018-05-23 NOTE — Progress Notes (Signed)
Pt. went into sinus brady rate 38.  Observed patient alert, responsive, asymptomatic at this time. Stayed with patient until changed over to normal sinus rhythm.  Patient stated that same thing happen the night before and it had started after she had a coughing spell.  Will monitor.

## 2018-05-23 NOTE — Evaluation (Addendum)
Physical Therapy Evaluation Patient Details Name: Monica [name] MRN: [id number] DOB: 08-19-1941 Today's Date: 05/23/2018   History of Present Illness  77 y.o. female admitted on 05/19/18 for VATSa and L wedge resection/lobectomy due to L lower lobe lung nodule.  Pt with significant PMH of dysrhythmia, DM2, CA (NHL, malt lymphoma), and dysphasia s/p maloney dilation.  Clinical Impression  Pt was able to walk the hallway with min guard assist overall and O2 sats dropped only to 89% with gait 88% in room after coughing bought, but rebounded once coughing stopped.  Educated on pursed lip breathing, flutter valve use, and energy conservation techniques.  Pt has good support from husband and will not have any follow up therapy needs at discharge. PT to follow acutely until d/c confirmed.       Follow Up Recommendations No PT follow up    Equipment Recommendations  None recommended by PT    Recommendations for Other Services   NA    Precautions / Restrictions Precautions Precautions: Other (comment) Precaution Comments: monitor O2 sats and vitals Restrictions Weight Bearing Restrictions: No      Mobility  Bed Mobility Overal bed mobility: Modified Independent             General bed mobility comments: HOB ~45 degrees  Transfers Overall transfer level: Needs assistance Equipment used: None Transfers: Sit to/from Stand Sit to Stand: Min guard         General transfer comment: Min guard assist for safe transitions, verbal cues to stand for a minute before ambulating.   Ambulation/Gait Ambulation/Gait assistance: Min guard Gait Distance (Feet): 300 Feet Assistive device: (hallway rail) Gait Pattern/deviations: WFL(Within Functional Limits) Gait velocity: decreased Gait velocity interpretation: 1.31 - 2.62 ft/sec, indicative of limited community ambulator General Gait Details: Slow, steady gait, using railing in the hallway lightly for support.           Balance  Overall balance assessment: Needs assistance Sitting-balance support: Feet supported;No upper extremity supported Sitting balance-Leahy Scale: Good     Standing balance support: Single extremity supported Standing balance-Leahy Scale: Fair                               Pertinent Vitals/Pain Pain Assessment: 0-10 Pain Score: 3  Pain Location: left incisional Pain Descriptors / Indicators: Sore Pain Intervention(s): Limited activity within patient's tolerance;Monitored during session;Repositioned    Home Living Family/patient expects to be discharged to:: Private residence Living Arrangements: Spouse/significant other Available Help at Discharge: Family;Available 24 hours/day Type of Home: House Home Access: Stairs to enter Entrance Stairs-Rails: None Entrance Stairs-Number of Steps: 1 Home Layout: One level Home Equipment: Environmental consultant - 2 wheels      Prior Function Level of Independence: Independent                  Extremity/Trunk Assessment   Upper Extremity Assessment Upper Extremity Assessment: Overall WFL for tasks assessed    Lower Extremity Assessment Lower Extremity Assessment: Generalized weakness    Cervical / Trunk Assessment Cervical / Trunk Assessment: Normal  Communication   Communication: No difficulties  Cognition Arousal/Alertness: Awake/alert Behavior During Therapy: WFL for tasks assessed/performed Overall Cognitive Status: Within Functional Limits for tasks assessed                                        General Comments  General comments (skin integrity, edema, etc.): O2 sats dropped to 88% on RA after coughing fit, but while walking, she was able to maintain at lowest 89% on RA during gait.  DOE 2/4, encoraged rest breaks (standing) and pursed lip breathing.  We talked about continued use of flutter valve at home at least until f/u with the surgeon, energy conservation techniques, using the walker PRN, and taking  multiple rest breaks during normal daily activities.         Assessment/Plan    PT Assessment Patient needs continued PT services  PT Problem List Decreased strength;Decreased activity tolerance;Decreased balance;Decreased mobility;Decreased knowledge of precautions;Cardiopulmonary status limiting activity;Pain       PT Treatment Interventions DME instruction;Gait training;Functional mobility training;Therapeutic activities;Therapeutic exercise;Balance training;Stair training;Patient/family education    PT Goals (Current goals can be found in the Care Plan section)  Acute Rehab PT Goals Patient Stated Goal: to go home PT Goal Formulation: With patient Time For Goal Achievement: 06/06/18 Potential to Achieve Goals: Good    Frequency Min 3X/week           AM-PAC PT "6 Clicks" Daily Activity  Outcome Measure Difficulty turning over in bed (including adjusting bedclothes, sheets and blankets)?: A Little Difficulty moving from lying on back to sitting on the side of the bed? : A Little Difficulty sitting down on and standing up from a chair with arms (e.g., wheelchair, bedside commode, etc,.)?: Unable Help needed moving to and from a bed to chair (including a wheelchair)?: A Little Help needed walking in hospital room?: A Little Help needed climbing 3-5 steps with a railing? : A Little 6 Click Score: 16    End of Session   Activity Tolerance: Patient limited by fatigue;Patient limited by pain Patient left: in bed;with call bell/phone within reach;with family/visitor present;Other (comment)(in bed as RN plans to pull central line) Nurse Communication: Mobility status PT Visit Diagnosis: Muscle weakness (generalized) (M62.81);Difficulty in walking, not elsewhere classified (R26.2)    Time: 4235-3614 PT Time Calculation (min) (ACUTE ONLY): 21 min   Charges:        Wells Guiles B. Kaylan Yates, PT, DPT [phone]   PT Evaluation $PT Eval Moderate Complexity: 1 Mod      05/23/2018, 11:30 AM

## 2018-05-23 NOTE — Discharge Instructions (Signed)
Thoracotomy, Care After  This sheet gives you information about how to care for yourself after your procedure. Your health care provider may also give you more specific instructions. If you have problems or questions, contact your health care provider. What can I expect after the procedure? After your procedure, it is common to have:  Pain and swelling around the incision area.  Pain when you breathe in (inhale).  Constipation.  Fatigue.  Loss of appetite.  Trouble sleeping.  Mood swings and depression.  Follow these instructions at home: Preventing pneumonia  Take deep breaths or do breathing exercises as instructed by your health care provider.  Cough frequently. Coughing may cause discomfort, but it is important to clear mucus (phlegm) and expand your lungs. If coughing hurts, hold a pillow against your chest or place both hands flat on top of the incision (splinting) when you cough. This may help relieve discomfort.  Continue to use an incentive spirometer as directed. This is a tool that measures how well you fill your lungs with each breath.  Participate in pulmonary rehabilitation as directed. This is a program that combines education, exercise, and support from a team of specialists. The goal is to help you heal and return to normal activities as soon as possible. Medicines  Take over-the-counter or prescription medicines only as told by your health care provider.  If you have pain, take pain-relieving medicine before your pain becomes severe. This is important because if your pain is under control, you will be able to breathe and cough more comfortably.  If you were prescribed an antibiotic medicine, take it as told by your health care provider. Do not stop taking the antibiotic even if you start to feel better. Activity  Ask your health care provider what activities are safe for you.  Do not travel by airplane for 2 weeks after your chest tube is removed, or until  your health care provider says that this is safe.  Do not lift anything that is heavier than 10 lb (4.5 kg), or the limit that your health care provider tells you, until he or she says that it is safe.  Do not drive until your health care provider approves. ? Do not drive or use heavy machinery while taking prescription pain medicine. Incision care  Follow instructions from your health care provider about how to take care of your incision. Make sure you: ? Wash your hands with soap and water before you change your bandage (dressing). If soap and water are not available, use hand sanitizer. ? Change your dressing as told by your health care provider. ? Leave stitches (sutures), skin glue, or adhesive strips in place. These skin closures may need to stay in place for 2 weeks or longer. If adhesive strip edges start to loosen and curl up, you may trim the loose edges. Do not remove adhesive strips completely unless your health care provider tells you to do that.  Keep your dressing dry.  Check your incision area every day for signs of infection. Check for: ? More redness, swelling, or pain. ? More fluid or blood. ? Warmth. ? Pus or a bad smell. Bathing  Do not take baths, swim, or use a hot tub until your health care provider approves. You may take showers.  After your dressing has been removed, use soap and water to gently wash your incision area. Do not use anything else to clean your incision unless your health care provider tells you to do that. Eating  and drinking  Eat a healthy diet as instructed by your health care provider. A healthy diet includes plenty of fresh fruits and vegetables, whole grains, and low-fat (lean) proteins.  Drink enough fluid to keep your urine clear or pale yellow. General instructions  To prevent or treat constipation while you are taking prescription pain medicine, your health care provider may recommend that you: ? Take over-the-counter or prescription  medicines. ? Eat foods that are high in fiber, such as fresh fruits and vegetables, whole grains, and beans. ? Limit foods that are high in fat and processed sugars, such as fried and sweet foods.  Do not use any products that contain nicotine or tobacco, such as cigarettes and e-cigarettes. If you need help quitting, ask your health care provider.  Avoid secondhand smoke.  Wear compression stockings as told by your health care provider. These stockings help to prevent blood clots and reduce swelling in your legs.  If you have a chest tube, care for it as instructed.  Keep all follow-up visits as told by your health care provider. This is important. Contact a health care provider if:  You have more redness, swelling, or pain around your incision.  You have more fluid or blood coming from your incision.  Your incision feels warm to the touch.  You have pus or a bad smell coming from your incision.  You have a fever or chills.  Your heartbeat seems irregular.  You have nausea or vomiting.  You have muscle aches.  You are constipated. This may mean that you have: ? Fewer bowel movements in a week than normal. ? Difficulty having a bowel movement. ? Stools that are dry, hard, or larger than normal. Get help right away if:  You develop a rash.  You feel light-headed or feel like you are going to faint.  You have shortness of breath or trouble breathing.  You are confused.  You have trouble speaking.  You have vision problems.  You are not able to move.  You have numbness in your face, arms, or legs.  You lose consciousness.  You have a sudden, severe headache.  You feel weak.  You have chest pain.  You have pain that: ? Is severe. ? Gets worse, even with medicine. Summary  To prevent pneumonia, take deep breaths, do breathing exercises, and cough frequently, as instructed by your health care provider.  Do not drive until your health care provider  approves. Do not travel by airplane for 2 weeks after your chest tube is removed, or until your health care provider approves.  Check your incision area every day for signs of infection.  Eat a healthy diet that includes plenty of fresh fruits and vegetables, whole grains, and low-fat (lean) proteins. This information is not intended to replace advice given to you by your health care provider. Make sure you discuss any questions you have with your health care provider. Document Released: 04/02/2011 Document Revised: 07/12/2016 Document Reviewed: 07/12/2016 [employer] Interactive Patient Education  2017 [employer].

## 2018-05-23 NOTE — Consult Note (Addendum)
Cardiology Consult    Patient ID: Monica [name] MRN: [id number], DOB/AGE: Jul 11, 1941   Admit date: 05/19/2018 Date of Consult: 05/23/2018  Primary Physician: Manon Hilding, MD Primary Cardiologist: New Requesting Provider: Dr. Roxan Hockey Reason for Consultation: Afib  Monica [name] is a 77 y.o. female who is being seen today for the evaluation of Afib at the request of Dr. Roxan Hockey.  Patient Profile    77 year old female with past medical history of tobacco abuse, type 2 diabetes, gastric MALT lymphoma, hiatal hernia, depression and palpitations who underwent a VATS procedure and developed postoperative atrial fibrillation.  Past Medical History   Past Medical History:  Diagnosis Date  . Cancer (HCC)    NHL, Malt Lymphoma  . Depression   . DM (diabetes mellitus) ([place])    TYPE  2  . Dysphagia   . Dysrhythmia    History of palpatations  . GERD (gastroesophageal reflux disease)    INGESTION   . Heart palpitations   . Hiatal hernia   . Hyperlipidemia   . Interstitial cystitis   . 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 Sinusitis     Past Surgical History:  Procedure Laterality Date  . ABDOMINAL HYSTERECTOMY    . BIOPSY  09/01/2016   Procedure: BIOPSY;  Surgeon: Daneil Dolin, MD;  [facility];  Service: Endoscopy;;  gastric  . BLADDER SURGERY    . CATARACT EXTRACTION, BILATERAL    . COLONOSCOPY     Dr. Lindalou Hose 2009: Normal per PCP notes  . ESOPHAGOGASTRODUODENOSCOPY     RMR: Prominant Schzgzki ring/component of peptic stricture status post dilation and disruption as described above, otherwise norma esophagus, moderate-sized hiatal hernia, antal pyloric channel, and posterier bulbar erosions, otherwise unremarkable stomach, D1 and D2 . Inflammatory findings on the stomach and duodenum will likely be related to aspirin effect. We need to rule out Helicobacter pylor  . ESOPHAGOGASTRODUODENOSCOPY N/A 03/10/2015   Dr. 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 Pathology with lymphoid population of stomach, slight atypia  . ESOPHAGOGASTRODUODENOSCOPY N/A 02/03/2016   Dr. Gala Romney: Schatzki ring noted at GE junction, dilated with 58 and then 74 [place] dilator. Large hiatal hernia. 6 x 7 cm nodular geographically ulcerated mucosa, biopsy c/w MALToma  . ESOPHAGOGASTRODUODENOSCOPY N/A 04/08/2016   Dr. Gala Romney: moderate Schatzki's ring/web s/p dilation, large hiatal hernia, localized area of gastric lymphoma visualized and appeared to be much improved. normal second portion of the duodenum. No specimens collected. Esophageal lumen notably tighted up significantly since her dilation in April of this year.   . ESOPHAGOGASTRODUODENOSCOPY N/A 08/04/2016   Procedure: ESOPHAGOGASTRODUODENOSCOPY (EGD);  Surgeon: Daneil Dolin, MD;  [facility];  Service: Endoscopy;  Laterality: N/A;  7:30 am  . ESOPHAGOGASTRODUODENOSCOPY N/A 09/01/2016   Procedure: ESOPHAGOGASTRODUODENOSCOPY (EGD);  Surgeon: Daneil Dolin, MD;  [facility];  Service: Endoscopy;  Laterality: N/A;  830   . ESOPHAGOGASTRODUODENOSCOPY N/A 05/10/2017   Dr. Gala Romney: Moderate Schatzki ring at the GE junction, status post dilation with 86 [place].  Medium sized hiatal hernia.  Few localized erosions in the gastric antrum.  Stomach biopsy showed chronic gastritis, no H. pylori.  No atypical lymphoid infiltrates or other features of lymphoproliferative process  . LOBECTOMY Left 05/19/2018   Procedure: LEFT LOWER LOBECTOMY;  Surgeon: [name], MD;  Location: [place];  Service: Thoracic;  Laterality: Left;  Marland Kitchen MALONEY DILATION N/A  03/10/2015   Procedure: Venia Minks DILATION;  Surgeon: Daneil Dolin, MD;  [facility];  Service: Endoscopy;  Laterality: N/A;  . Venia Minks DILATION N/A 02/03/2016   Procedure: Venia Minks DILATION;  Surgeon: Daneil Dolin, MD;  [facility];   Service: Endoscopy;  Laterality: N/A;  . Venia Minks DILATION N/A 04/08/2016   Procedure: Venia Minks DILATION;  Surgeon: Daneil Dolin, MD;  [facility];  Service: Endoscopy;  Laterality: N/A;  . Venia Minks DILATION N/A 05/10/2017   Procedure: Venia Minks DILATION;  Surgeon: Daneil Dolin, MD;  [facility];  Service: Endoscopy;  Laterality: N/A;  . VIDEO ASSISTED THORACOSCOPY (VATS)/WEDGE RESECTION Left 05/19/2018   Procedure: VIDEO ASSISTED THORACOSCOPY (VATS)/WEDGE RESECTION;  Surgeon: [name], MD;  Location: [place];  Service: Thoracic;  Laterality: Left;    Allergies  Allergies  Allergen Reactions  . Sulfonamide Derivatives Hives  . Sulfur     History of Present Illness    Monica [name] is a 77 year old female with past medical history of tobacco abuse, type 2 diabetes, gastric MALT lymphoma, hiatal hernia, depression and palpitations.  She reports smoking since age of 89, and smoked about a pack a day majority of the time.  Was diagnosed with gastric MALT lymphoma 2016 and underwent radiation.  Recently saw Dr. Walden Field for follow-up and underwent a PET/CT scan.  This showed some activity but no mass in the gastric antrum, but showed no new 1.3 cm left lower lobe nodule.  She was referred to [facility] for further work-up.  She was seen by Dr. Roxan Hockey and admitted to undergo a left VATS procedure on 05/19/2018.  Had successful wedge resection of the left lower lobe nodule and left lower lobectomy with mediastinal lymph node dissection.  She did well initially, and on postop day 2 was noted to have some sinus bradycardia and her Lopressor was decreased.  Her chest tube was discontinued and on postop day 3 was found to have new onset atrial fibrillation with elevated rates in the 140s.  She was started on amiodarone 400 mg twice a day, but rates remained elevated.  She did eventually convert to sinus rhythm on amiodarone, but then had noted bradycardia with what appears to be  second-degree AV block late in the evening last night.  She was asymptomatic during that time except for cough.  In talking with patient she is unaware whenever she is in atrial fibrillation.  Her labs have remained stable with normal electrolytes, creatinine 0.70, hemoglobin 10.3. Other than this episode of Afib she has done well without significant complication. No chest pain, dizziness, palpitations light-headedness. Of note she reports being placed on Toprol as an outpatient for Hx of palpitations and not HTN.   Inpatient Medications    . acetaminophen  1,000 mg Oral Q6H   Or  . acetaminophen (TYLENOL) oral liquid 160 mg/5 mL  1,000 mg Oral Q6H  . albuterol  2.5 mg Nebulization Q4H while awake  . amiodarone  400 mg Oral BID  . aspirin EC  81 mg Oral QHS  . bisacodyl  10 mg Oral Daily  . citalopram  20 mg Oral Daily  . enoxaparin (LOVENOX) injection  40 mg Subcutaneous Q24H  . guaiFENesin  1,200 mg Oral BID  . insulin aspart  0-15 Units Subcutaneous TID WC  . metFORMIN  500 mg Oral BID WC  . metoprolol succinate  25 mg Oral Daily  . pantoprazole  40 mg Oral QAC breakfast  .  pravastatin  40 mg Oral QHS  . senna-docusate  1 tablet Oral QHS    Family History    Family History  Problem Relation Age of Onset  . Heart attack Father   . Dementia Father   . Diabetes Sister   . Diabetes Brother   . Diabetes Sister   . Colon cancer Neg Hx     Social History    Social History   Socioeconomic History  . Marital status: Married    Spouse name: Not on file  . Number of children: Not on file  . Years of education: Not on file  . Highest education level: Not on file  Occupational History  . Not on file  Social Needs  . Financial resource strain: Not on file  . Food insecurity:    Worry: Not on file    Inability: Not on file  . Transportation needs:    Medical: Not on file    Non-medical: Not on file  Tobacco Use  . Smoking status: Current Every Day Smoker    Packs/day: 0.75     Years: 57.00    Pack years: 42.75    Types: Cigarettes  . Smokeless tobacco: Never Used  . Tobacco comment: trying to quit, wearing patch  Substance and Sexual Activity  . Alcohol use: No    Alcohol/week: 0.0 oz  . Drug use: No  . Sexual activity: Not on file  Lifestyle  . Physical activity:    Days per week: Not on file    Minutes per session: Not on file  . Stress: Not on file  Relationships  . Social connections:    Talks on phone: Not on file    Gets together: Not on file    Attends religious service: Not on file    Active member of club or organization: Not on file    Attends meetings of clubs or organizations: Not on file    Relationship status: Not on file  . Intimate partner violence:    Fear of current or ex partner: Not on file    Emotionally abused: Not on file    Physically abused: Not on file    Forced sexual activity: Not on file  Other Topics Concern  . Not on file  Social History Narrative  . Not on file     Review of Systems    See HPI  All other systems reviewed and are otherwise negative except as noted above.  Physical Exam    Blood pressure 136/66, pulse 65, temperature 98.2 F (36.8 C), temperature source Oral, resp. rate (!) 23, height 5\' 6"  (1.676 m), weight 166 lb 0.1 oz (75.3 kg), SpO2 95 %.  General: Pleasant, older WF, NAD Psych: Normal affect. Neuro: Alert and oriented X 3. Moves all extremities spontaneously. HEENT: Normal  Neck: Supple, no JVD. Lungs:  Resp regular and unlabored, expiratory wheezing. Heart: RRR no s3, s4, or murmurs. Abdomen: Soft, non-tender, non-distended, BS + x 4.  Extremities: No clubbing, cyanosis or edema. DP/PT/Radials 2+ and equal bilaterally.  Labs    Troponin (Point of Care Test) No results for input(s): TROPIPOC in the last 72 hours. No results for input(s): CKTOTAL, CKMB, TROPONINI in the last 72 hours. Lab Results  Component Value Date   WBC 8.0 05/21/2018   HGB 10.3 (L) 05/21/2018   HCT  32.8 (L) 05/21/2018   MCV 91.1 05/21/2018   PLT 202 05/21/2018    Recent Labs  Lab 05/21/18 0218  NA  139  K 4.5  CL 103  CO2 29  BUN 8  CREATININE 0.70  CALCIUM 8.4*  PROT 5.8*  BILITOT 0.6  ALKPHOS 44  ALT 17  AST 23  GLUCOSE 122*   No results found for: CHOL, HDL, LDLCALC, TRIG No results found for: [facility]   Radiology Studies    Dg Chest 2 View  Result Date: 05/22/2018 CLINICAL DATA:  Follow-up chest tube removal EXAM: CHEST - 2 VIEW COMPARISON:  05/21/2018 FINDINGS: Cardiac shadow is stable. Aortic calcifications are again seen. Left subclavian central line is again noted and stable. Some mild subcutaneous emphysema is noted on the left related to the prior chest tube. No definitive recurrent pneumothorax is noted following chest tube removal. Mild bibasilar atelectatic changes are seen. No acute bony abnormality is noted. IMPRESSION: Mild bibasilar atelectasis relatively stable from the prior exam. No significant recurrent pneumothorax is noted at this time. Electronically Signed   By: Inez Catalina M.D.   On: 05/22/2018 07:49   Dg Chest 2 View  Result Date: 05/18/2018 CLINICAL DATA:  Preoperative respiratory exam for lung surgery. EXAM: CHEST - 2 VIEW COMPARISON:  Radiography 01/23/2018.  PET scan 04/10/2018. FINDINGS: Heart size is normal. Large hiatal hernia again noted. Aortic atherosclerosis is seen. Chronic interstitial lung markings. 14 mm pulmonary nodule in the left lower lobe shown on previous imaging is difficult to appreciate on radiography, but visible as a subtle density. No other focal pulmonary finding. No effusions. Ordinary degenerative changes affect the spine. IMPRESSION: Hiatal hernia. Aortic atherosclerosis. Subtle left lower lobe pulmonary nodule, better shown at PET imaging. Electronically Signed   By: Nelson Chimes M.D.   On: 05/18/2018 08:03   Dg Chest Port 1 View  Result Date: 05/21/2018 CLINICAL DATA:  Two days post left VATS. EXAM: PORTABLE CHEST 1  VIEW COMPARISON:  05/21/2018 FINDINGS: Left subclavian Port-A-Cath unchanged. Interval removal of left-sided chest tube. Lungs are adequately inflated with mild prominence of the perihilar vasculature. Persistent bibasilar opacification likely small effusions with atelectasis. Previously seen tiny left apical pneumothorax not visualized. Mild stable cardiomegaly. Remainder of the exam is unchanged. IMPRESSION: Left chest tube removed with no residual left apical pneumothorax identified. Persistent bibasilar opacification without significant change likely small effusions with atelectasis. Evidence of mild vascular congestion and mild stable cardiomegaly. Electronically Signed   By: Marin Olp M.D.   On: 05/21/2018 15:01   Dg Chest Port 1 View  Result Date: 05/21/2018 CLINICAL DATA:  Status post left lung lobectomy. EXAM: PORTABLE CHEST 1 VIEW COMPARISON:  Yesterday. FINDINGS: A left chest tube remains in place. There has been no significant change in a tiny residual left apical pneumothorax. Left subclavian catheter tip in the region of the confluence of the innominate veins. No significant change in mild left lateral subcutaneous emphysema. Stable mildly enlarged cardiac silhouette. Increased patchy opacity at the left lung base with interval patchy opacity at the right lung base. The interstitial markings remain prominent. Diffuse osteopenia. IMPRESSION: 1. Stable tiny left apical pneumothorax with a left chest tube in place. 2. Increased patchy atelectasis or pneumonia at the left lung base. 3. Interval patchy atelectasis or pneumonia at the right lung base. 4. Stable cardiomegaly and chronic interstitial lung disease with possible mild superimposed interstitial pulmonary edema. Electronically Signed   By: Claudie Revering M.D.   On: 05/21/2018 08:51   Dg Chest Port 1 View  Result Date: 05/20/2018 CLINICAL DATA:  Left lower lobe lobectomy. EXAM: PORTABLE CHEST 1 VIEW COMPARISON:  05/19/2018 FINDINGS: The  left-sided chest tube is stable. Suspect tiny residual apical pneumothorax. The left subclavian catheter is stable. Improved lung aeration with resolving interstitial edema. There is a persistent small left effusion and left basilar atelectasis. IMPRESSION: Postoperative support apparatus in good position without complicating features. Tiny residual apical pneumothorax. Improved lung aeration with resolving edema and atelectasis. Persistent small left effusion and left basilar atelectasis. Electronically Signed   By: Marijo Sanes M.D.   On: 05/20/2018 08:34   Dg Chest Port 1 View  Result Date: 05/19/2018 CLINICAL DATA:  Prior lung surgery. EXAM: PORTABLE CHEST 1 VIEW COMPARISON:  05/16/2018. FINDINGS: Left subclavian line noted with tip over superior vena cava. Left chest tube noted with tip over the left upper chest. Tiny left apical and possible left base pneumothorax cannot be excluded. Cardiomegaly. No prominent pulmonary venous congestion. Postsurgical changes left lung. Bibasilar atelectasis. Bilateral interstitial prominence. A component these changes may be chronic. Active interstitial process including CHF and/or pneumonitis cannot be excluded. IMPRESSION: 1. Left subclavian line noted with tip over superior vena cava. Left chest tube noted with tip over left upper chest. Tiny left apical and possible left base pneumothorax cannot be excluded. 2.  Postsurgical changes left lung.  Bibasilar atelectasis. 3. Cardiomegaly. Mild bilateral from interstitial prominence. Component interstitial changes may be chronic. Active interstitial process including mild interstitial edema and/or pneumonitis cannot be excluded. Critical Value/emergent results were called by telephone at the time of interpretation on 05/19/2018 at 12:41 pm to nurse Loree Fee, who verbally acknowledged these results. Electronically Signed   By: [place]   On: 05/19/2018 12:45    ECG & Cardiac Imaging    EKG:  The EKG 7/21 was  personally reviewed and demonstrates SB  Assessment & Plan    77 year old female with past medical history of tobacco abuse, type 2 diabetes, gastric MALT lymphoma, hiatal hernia, depression and palpitations who underwent a VATS procedure and developed postoperative atrial fibrillation.  1. Post Op PAF: Developed post op day 3. Placed on amiodarone 400mg  BID, and continue on Toprol. Now back in SR but had an episode of 2:1 HB on telemetry in the evening noted. Given this would stop Toprol and continue amiodarone at 200mg  BID x2 weeks, then taper to 200mg  daily. Hopefully able to DC after a month. Will arrange for follow up in the office with an outpatient echo. Given this episode was brief in the post op setting, seems reason to hold on [facility] unless she has a recurrence of Afib as an outpatient.   2. LLL lung nodule s/p VATs: per primary. Plan for discharge home today.   3. Tobacco use: cessation advised.   Barnet Pall, NP-C Pager [phone] 05/23/2018, 12:25 PM  Personally seen and examined. Agree with above.  77 year old with VATS procedure who developed postoperative atrial fibrillation and in the past is felt palpitations but never has been diagnosed with atrial fibrillation.  She was taking Toprol in the past for the palpitations.  She is a smoker.  No prior cardiac history.  She does have diabetes.  Currently she is asymptomatic.  In sinus rhythm currently.  Telemetry reassuring.  Just had a coughing fit.  Sometimes on telemetry she will bradycardia down during coughing, vagal maneuver, one time developing second-degree heart block type I.  GEN: Well nourished, well developed, in no acute distress  HEENT: normal  Neck: no JVD, carotid bruits, or masses Cardiac: RRR; no murmurs, rubs, or gallops,no edema  Respiratory: Mild wheeze heard  bilaterally, normal work of breathing GI: soft, nontender, nondistended, + BS MS: no deformity or atrophy  Skin: warm and dry, no  rash Neuro:  Alert and Oriented x 3, Strength and sensation are intact Psych: euthymic mood, full affect  Lab work reviewed EKG reviewed  Assessment and plan:  Paroxysmal postoperative atrial fibrillation -Brief episode.  Post surgical procedure. - Let us continue amiodarone 200 mg twice a day for 2 weeks then 200 mg thereafter for likely a month total.  If she remains in sinus rhythm, stop amiodarone.  We will see her back as outpatient.  I will also order her an echocardiogram as an outpatient to make sure that she does not have any other structural abnormalities.  We will stop Toprol for now with the amiodarone especially given her brief periods of bradycardia.  Of course, if atrial fibrillation returns or becomes more frequent, she warrants anticoagulation.  For now she has a potentially reversible cause given her postoperative change.  Tobacco use -Strongly encouraged tobacco cessation  Lobectomy -Per Dr. Roxan Hockey  [facility] will sign off.   Medication Recommendations:  AMIO 200 BID for 2 weeks then 200 QD therafter (likely for 1 month) Other recommendations (labs, testing, etc):  ECHO Follow up as an outpatient:  With me or APP in 2-4 weeks.   Ok to dc.  Candee Furbish, MD\

## 2018-05-23 NOTE — Progress Notes (Addendum)
      [address]       [place] [address]             [phone]      4 Days Post-Op Procedure(s) (LRB): VIDEO ASSISTED THORACOSCOPY (VATS)/WEDGE RESECTION (Left) LEFT LOWER LOBECTOMY (Left) Subjective: Feels good this morning. Still has a productive cough  Objective: Vital signs in last 24 hours: Temp:  [97.8 F (36.6 C)-98.4 F (36.9 C)] 97.8 F (36.6 C) (07/23 0733) Pulse Rate:  [62-104] 66 (07/23 0733) Cardiac Rhythm: Normal sinus rhythm (07/23 0700) Resp:  [13-21] 17 (07/23 0733) BP: (120-147)/(45-87) 120/58 (07/23 0733) SpO2:  [95 %-99 %] 97 % (07/23 0733)     Intake/Output from previous day: 07/22 0701 - 07/23 0700 In: 976.9 [P.O.:960; I.V.:16.9] Out: -  Intake/Output this shift: No intake/output data recorded.  General appearance: alert, cooperative and no distress Heart: regular rate and rhythm, S1, S2 normal, no murmur, click, rub or gallop Lungs: clear to auscultation bilaterally Abdomen: soft, non-tender; bowel sounds normal; no masses,  no organomegaly Extremities: extremities normal, atraumatic, no cyanosis or edema Wound: clean and dry  Lab Results: Recent Labs    05/21/18 0218  WBC 8.0  HGB 10.3*  HCT 32.8*  PLT 202   BMET:  Recent Labs    05/21/18 0218  NA 139  K 4.5  CL 103  CO2 29  GLUCOSE 122*  BUN 8  CREATININE 0.70  CALCIUM 8.4*    PT/INR: No results for input(s): LABPROT, INR in the last 72 hours. ABG    Component Value Date/Time   PHART 7.385 05/20/2018 0419   HCO3 24.7 05/20/2018 0419   O2SAT 95.5 05/20/2018 0419   CBG (last 3)  Recent Labs    05/22/18 1155 05/22/18 1648 05/22/18 2117  GLUCAP 136* 175* 135*    Assessment/Plan: S/P Procedure(s) (LRB): VIDEO ASSISTED THORACOSCOPY (VATS)/WEDGE RESECTION (Left) LEFT LOWER LOBECTOMY (Left)  1. CV-NSR in the 60bpm. Episode of bradycardia in the 30s last night noted. Toprol XL initiated yesterday. BP better controlled. Continue Amio oral 400mg   BID.  2. Pulm-chest xray appears stable when compared to yesterdays study. Tolerating 1L [place] for oxygen support.  3. Renal-creatinine 0.70, electrolytes okay 4. H and H is stable at 10.3/32.8 5. Endo-blood glucose level is well controlled on Metformin and SSI  Plan: 6 minute walk text to evaluate for home oxygen needs. Likely home later today.      LOS: 4 days    Elgie Collard 05/23/2018 Patient seen and examined, agree with above Feels much better today. Had some bradycardia and 2nd degree block overnight. Associated with coughing so may just be vagal. Will ask Cardiology to evaluate this AM. Path still pending  Remo Lipps C. Roxan Hockey, MD [facility] [phone]

## 2018-05-23 NOTE — Progress Notes (Addendum)
SATURATION QUALIFICATIONS: (This note is used to comply with regulatory documentation for home oxygen)  Patient Saturations on Room Air at Rest = 91%  Patient Saturations on Room Air while Ambulating = 89%  Patient Saturations on  (Not tested) Liters of oxygen while Ambulating = (not tested) %  Please briefly explain why patient needs home oxygen:  Pt educated in pursed lip breathing and energy conservation techniques.  DOE during gait only 2/4 and pt self regulating gait speed to help control dyspnea.  For the most part, pt's O2 sats were better while walking than seated reclined in the bed and increased nicely with standing rest breaks and pursed lip breathing.  According to medicare guidelines pt has to be <88% to qualify for O2 (ie 87% during mobility on RA) and this pt never got that low.

## 2018-05-26 ENCOUNTER — Telehealth: Payer: Self-pay | Admitting: Thoracic Surgery (Cardiothoracic Vascular Surgery)

## 2018-05-26 NOTE — Telephone Encounter (Signed)
Called with path results- T1a,N0 squamous cell carcinoma- Stage IA  Remo Lipps C. Roxan Hockey, MD [facility] [phone]

## 2018-06-05 ENCOUNTER — Encounter (HOSPITAL_COMMUNITY): Payer: Self-pay | Admitting: Internal Medicine

## 2018-06-05 ENCOUNTER — Inpatient Hospital Stay (HOSPITAL_COMMUNITY): Payer: Medicare Other | Attending: Hematology | Admitting: Internal Medicine

## 2018-06-05 VITALS — BP 139/58 | HR 73 | Temp 98.0°F | Resp 18 | Wt 163.0 lb

## 2018-06-05 DIAGNOSIS — E119 Type 2 diabetes mellitus without complications: Secondary | ICD-10-CM | POA: Diagnosis not present

## 2018-06-05 DIAGNOSIS — F17210 Nicotine dependence, cigarettes, uncomplicated: Secondary | ICD-10-CM | POA: Diagnosis not present

## 2018-06-05 DIAGNOSIS — Z8572 Personal history of non-Hodgkin lymphomas: Secondary | ICD-10-CM | POA: Insufficient documentation

## 2018-06-05 DIAGNOSIS — C3490 Malignant neoplasm of unspecified part of unspecified bronchus or lung: Secondary | ICD-10-CM

## 2018-06-05 DIAGNOSIS — C3432 Malignant neoplasm of lower lobe, left bronchus or lung: Secondary | ICD-10-CM | POA: Diagnosis not present

## 2018-06-05 DIAGNOSIS — D381 Neoplasm of uncertain behavior of trachea, bronchus and lung: Secondary | ICD-10-CM

## 2018-06-05 DIAGNOSIS — Z9889 Other specified postprocedural states: Secondary | ICD-10-CM

## 2018-06-06 MED ORDER — MISC. DEVICES MISC: 0 refills | Status: DC

## 2018-06-06 NOTE — Progress Notes (Addendum)
Diagnosis Recent major surgery - Plan: Misc. Devices MISC  Malignant neoplasm of unspecified part of unspecified bronchus or lung ([place]) - Plan: MR Brain W Wo Contrast, Wheelchair, Misc. Devices MISC  Staging Cancer Staging MALToma [facility]) Staging form: Lymphoid Neoplasms, AJCC 6th Edition - Clinical stage from 03/23/2016: Stage I - Signed by Baird Cancer, PA-C on 03/23/2016   Assessment and Plan:  1.  NHL, Gastric Malt Lymphoma.  Pt was previously followed by Dr. Talbert Cage.  She was diagnosed in 2016.  She was H. Pylori Negative.  She was treated with RT.    CT of abdomen and pelvis done 09/19/2018 showed  IMPRESSION: 1. No acute findings within the abdomen or pelvis. 2. Mild hepatic steatosis. 3. Moderate hiatal hernia. No stomach mass or wall thickening to suggest recurrent gastric lymphoma. 4. 3.4 cm lower pole right renal cyst. 5. Status post hysterectomy. 6. Left colon diverticula without evidence of diverticulitis. 7. Aortic atherosclerosis.  Pt shows no evidence of recurrent lymphoma.  She will be set up for PET scan in 10/2018 for interval follow-up.    2.  T1b N0 NSCLC ( Stage 1A2).  Pt was complaining of some abdominal and pelvic discomfort.  She was  set up for PET scan for repeat evaluation, especially based on lung nodules noted on prior CT.    Pet scan done 04/10/2018 was reviewed and showed IMPRESSION: 1. Hypermetabolic solid 1.4 cm left lower lobe pulmonary nodule, new, suspicious for primary bronchogenic carcinoma given the advanced smoking related changes in the lungs. 2. No hypermetabolic thoracic adenopathy or distant metastatic disease. 3. Mild hypermetabolism in the antral region of the stomach without CT correlate, nonspecific, which could be due to peristalsis, inflammatory change or early recurrent tumor. 4. Chronic findings include: Aortic Atherosclerosis (ICD10-I70.0) and Emphysema (ICD10-J43.9). Marked sigmoid diverticulosis. Moderate to large hiatal  hernia. Three-vessel coronary atherosclerosis.  She was notified via phone of results.  She reported she had a vacation planned and desired to wait for evaluation until after her return.  Pt was referred to Dr. Roxan Hockey for evaluation.  Upon evaluation by Dr. Roxan Hockey she was set up for surgery and underwent left lower lobe resection on 05/19/2018 with pathology returning as 1. 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 - THE SURGICAL RESECTION MARGINS ARE NEGATIVE FOR CARCINOMA. - SEE ONCOLOGY TABLE BELOW.  2 level 9 and 2 level 11 lymph nodes were negative for malignancy.    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    Level 7, level 11, level 4L, level 12, level 5 lymph nodes were all negative.    She was staged as a T1 b N0 stage IA2 non-small cell lung cancer.    Long talk was held with the patient today.  I discussed with her she has been diagnosed with an early stage lung cancer.  Adjuvant chemotherapy would not be recommended.  Will send her lung specimen for lung biomarker testing for future reference.  She will be set up for an MRI of the brain to complete her staging.  She will also be referred to radiation oncology for evaluation and opinion due to recent diagnosis of lung cancer and neuroendocrine tumorlet noted from recent surgery.    I have discussed with her this cancer is associated with smoking so smoking cessation is recommended.  She reports she has not smoked since her surgery.  She will continue to  be followed closely and will return to clinic in December 2019 for follow-up and repeat imaging.  All questions answered and she expressed understanding of the information presented.    3.  Smoking. Cessation recommended due to recent diagnosis of SCC.  She reports she is no longer smoking since lung surgery.    4.  Request for wheel chair and  handicapped sticker.  Forms filled out today in clinic.  Patient needs aids to help with ambulation. A walker or cane is not sufficient. A wheelchair is needed due to weakness and shortness of breath to aid patient is ADL's.  5.  DM.  Follow-up with PCP.    6.  Health maintenance.  Screening mammogram done 04/2018 was negative.  She will have repeat mammogram in 04/2019.   Interval History:  Historical data obtained from the note dated 04/06/2018.  77 yr old female previously followed by Dr. Talbert Cage.  repeat upper endoscopy performed on 05/10/2017 which demonstrated no sign of residual or recurrent lymphoma.  CT abdomen pelvis with contrast on 09/19/2017 demonstrated no stomach mass or wall thickening to suggest recurrent gastric lymphoma.  Repeat CT chest without contrast to monitor her pulmonary nodules on 03/02/2017 demonstrated previously noted small left-sided pulmonary nodules are stable compared to prior examinations, considered benign at this time, presumably subpleural lymph nodes. No larger more suspicious appearing pulmonary nodules or masses are noted.   Current Status:  Pt is seen today for follow-up.  She is here to go pathology from recent surgery.  She is requesting wheel chair and handicapped sticker.  She reports she is still recovering from surgery.   She reports some weakness and SOB.  Pulse ox 97% on room air.      MALToma ([place])   03/21/2015 Miscellaneous    H Pylori IgG NEGATIVE      02/03/2016 Procedure    EGD Dr. Gala Romney, abnormal gastric mucosa. Pathology with EXTRANODAL Marginal zone lymphoma. NEGATIVE for H. Pylori      03/03/2016 Miscellaneous    H pylori stool antigen NEGATIVE      03/03/2016 PET scan    Focal area of hypermetabolism and wall thickening involving the body antral junction region of the stomach c/w history of lymphoma. 5.5 mm LLL pulm nodule not hypermetabolic. Non contrast chest CT in 6 month      03/18/2016 - 04/08/2016 Radiation Therapy    The gastric tumor  received 30 Gy in 15 fractions of 2 Gy        Problem List Patient Active Problem List   Diagnosis Date Noted  . S/P lobectomy of lung [Z90.2] 05/19/2018  . Early satiety [R68.81] 05/02/2018  . Colon cancer screening [Z12.11] 05/02/2018  . Flatulence/gas pain/belching [R14.0] 09/16/2017  . Lower abdominal pain [R10.30] 09/16/2017  . Nodule of left lung [R91.1] 01/07/2017  . Nausea without vomiting [R11.0] 04/06/2016  . Loss of weight [R63.4] 04/06/2016  . MALToma ([place]) [C88.4] 03/12/2016  . Mucosal abnormality of stomach [K31.89]   . Schatzki's ring [K22.2]   . Hiatal hernia [K44.9]   . HYPERCHOLESTEROLEMIA [E78.00] 09/02/2010  . DEPRESSION, MILD [F32.9] 09/02/2010  . TACHYCARDIA [R00.0] 09/02/2010  . Dysphagia [R13.10] 09/02/2010    Past Medical History Past Medical History:  Diagnosis Date  . Cancer (HCC)    NHL, Malt Lymphoma  . Depression   . DM (diabetes mellitus) ([place])    TYPE  2  . Dysphagia   . Dysrhythmia    History of palpatations  . GERD (gastroesophageal reflux  disease)    INGESTION   . Heart palpitations   . Hiatal hernia   . Hyperlipidemia   . Interstitial cystitis   . 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 Sinusitis     Past Surgical History Past Surgical History:  Procedure Laterality Date  . ABDOMINAL HYSTERECTOMY    . BIOPSY  09/01/2016   Procedure: BIOPSY;  Surgeon: Daneil Dolin, MD;  [facility];  Service: Endoscopy;;  gastric  . BLADDER SURGERY    . CATARACT EXTRACTION, BILATERAL    . COLONOSCOPY     Dr. Lindalou Hose 2009: Normal per PCP notes  . ESOPHAGOGASTRODUODENOSCOPY     RMR: Prominant Schzgzki ring/component of peptic stricture status post dilation and disruption as described above, otherwise norma esophagus, moderate-sized hiatal hernia, antal pyloric channel, and posterier bulbar erosions, otherwise unremarkable stomach, D1 and D2 . Inflammatory findings on the stomach and duodenum will likely be  related to aspirin effect. We need to rule out Helicobacter pylor  . ESOPHAGOGASTRODUODENOSCOPY N/A 03/10/2015   Dr. 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 Pathology with lymphoid population of stomach, slight atypia  . ESOPHAGOGASTRODUODENOSCOPY N/A 02/03/2016   Dr. Gala Romney: Schatzki ring noted at GE junction, dilated with 42 and then 24 [place] dilator. Large hiatal hernia. 6 x 7 cm nodular geographically ulcerated mucosa, biopsy c/w MALToma  . ESOPHAGOGASTRODUODENOSCOPY N/A 04/08/2016   Dr. Gala Romney: moderate Schatzki's ring/web s/p dilation, large hiatal hernia, localized area of gastric lymphoma visualized and appeared to be much improved. normal second portion of the duodenum. No specimens collected. Esophageal lumen notably tighted up significantly since her dilation in April of this year.   . ESOPHAGOGASTRODUODENOSCOPY N/A 08/04/2016   Procedure: ESOPHAGOGASTRODUODENOSCOPY (EGD);  Surgeon: Daneil Dolin, MD;  [facility];  Service: Endoscopy;  Laterality: N/A;  7:30 am  . ESOPHAGOGASTRODUODENOSCOPY N/A 09/01/2016   Procedure: ESOPHAGOGASTRODUODENOSCOPY (EGD);  Surgeon: Daneil Dolin, MD;  [facility];  Service: Endoscopy;  Laterality: N/A;  830   . ESOPHAGOGASTRODUODENOSCOPY N/A 05/10/2017   Dr. Gala Romney: Moderate Schatzki ring at the GE junction, status post dilation with 28 [place].  Medium sized hiatal hernia.  Few localized erosions in the gastric antrum.  Stomach biopsy showed chronic gastritis, no H. pylori.  No atypical lymphoid infiltrates or other features of lymphoproliferative process  . LOBECTOMY Left 05/19/2018   Procedure: LEFT LOWER LOBECTOMY;  Surgeon: [name], MD;  Location: [place];  Service: Thoracic;  Laterality: Left;  Marland Kitchen MALONEY DILATION N/A 03/10/2015   Procedure: Venia Minks DILATION;  Surgeon: Daneil Dolin, MD;  [facility];  Service: Endoscopy;  Laterality: N/A;   . Venia Minks DILATION N/A 02/03/2016   Procedure: Venia Minks DILATION;  Surgeon: Daneil Dolin, MD;  [facility];  Service: Endoscopy;  Laterality: N/A;  . Venia Minks DILATION N/A 04/08/2016   Procedure: Venia Minks DILATION;  Surgeon: Daneil Dolin, MD;  [facility];  Service: Endoscopy;  Laterality: N/A;  . Venia Minks DILATION N/A 05/10/2017   Procedure: Venia Minks DILATION;  Surgeon: Daneil Dolin, MD;  [facility];  Service: Endoscopy;  Laterality: N/A;  . VIDEO ASSISTED THORACOSCOPY (VATS)/WEDGE RESECTION Left 05/19/2018   Procedure: VIDEO ASSISTED THORACOSCOPY (VATS)/WEDGE RESECTION;  Surgeon: [name], MD;  [facility];  Service: Thoracic;  Laterality: Left;    Family History Family History  Problem Relation Age of Onset  .  Heart attack Father   . Dementia Father   . Diabetes Sister   . Diabetes Brother   . Diabetes Sister   . Colon cancer Neg Hx      Social History  reports that she has been smoking cigarettes.  She has a 42.75 pack-year smoking history. She has never used smokeless tobacco. She reports that she does not drink alcohol or use drugs.  Medications  Current Outpatient Medications:  .  acetaminophen (TYLENOL) 500 MG tablet, Take 2 tablets (1,000 mg total) by mouth every 6 (six) hours., Disp: 30 tablet, Rfl: 0 .  amiodarone (PACERONE) 200 MG tablet, Take 1 tab (200 mg) twice a day for 2 weeks then take 1 tab (200 mg) thereafter for a month or until seen by cardiology., Disp: 50 tablet, Rfl: 1 .  Calcium-Magnesium-Vitamin D (CALCIUM 500 PO), Take 500 mg by mouth every other day. , Disp: , Rfl:  .  citalopram (CELEXA) 20 MG tablet, Take 20 mg by mouth daily. , Disp: , Rfl:  .  doxycycline (VIBRAMYCIN) 100 MG capsule, TAKE 1 CAPSULE BY MOUTH EVERY OTHER DAY BEFORE BED AND TAKE CALCIUM CITRATE ON OPPOSITE DAYS, Disp: , Rfl: 5 .  metFORMIN (GLUCOPHAGE-XR) 500 MG 24 hr tablet, Take 500 mg 2 (two) times daily by mouth. , Disp: , Rfl:  .   oxyCODONE (OXY IR/ROXICODONE) 5 MG immediate release tablet, Take 1 tablet (5 mg total) by mouth every 6 (six) hours as needed for severe pain. (Patient taking differently: Take 5 mg by mouth at bedtime as needed for severe pain. ), Disp: 20 tablet, Rfl: 0 .  pantoprazole (PROTONIX) 40 MG tablet, Take 1 tablet (40 mg total) by mouth daily before breakfast., Disp: 90 tablet, Rfl: 3 .  pravastatin (PRAVACHOL) 40 MG tablet, Take 40 mg by mouth at bedtime. , Disp: , Rfl:  .  metoprolol tartrate (LOPRESSOR) 25 MG tablet, Take 25 mg by mouth every evening., Disp: , Rfl:  .  Misc. Devices MISC, Please provide patient with lightweight transport wheelchair., Disp: 1 each, Rfl: 0 .  Multiple Vitamin (MULTIVITAMIN WITH MINERALS) TABS tablet, Take 1 tablet by mouth daily., Disp: , Rfl:   Allergies Sulfur  Review of Systems Review of Systems - Oncology ROS negative other than weakness from recent lung surgery   Physical Exam  Vitals Wt Readings from Last 3 Encounters:  06/05/18 163 lb (73.9 kg)  05/20/18 166 lb 0.1 oz (75.3 kg)  05/17/18 166 lb 3.2 oz (75.4 kg)   Temp Readings from Last 3 Encounters:  06/05/18 98 F (36.7 C) (Oral)  05/23/18 98.2 F (36.8 C) (Oral)  05/17/18 97.8 F (36.6 C)   BP Readings from Last 3 Encounters:  06/05/18 (!) 139/58  05/23/18 136/66  05/17/18 (!) 174/74   Pulse Readings from Last 3 Encounters:  06/05/18 73  05/23/18 65  05/17/18 76   Constitutional: Well-developed, well-nourished, and in no distress.   HENT: Head: Normocephalic and atraumatic.  Mouth/Throat: No oropharyngeal exudate. Mucosa moist. Eyes: Pupils are equal, round, and reactive to light. Conjunctivae are normal. No scleral icterus.  Neck: Normal range of motion. Neck supple. No JVD present.  Cardiovascular: Normal rate, regular rhythm and normal heart sounds.  Exam reveals no gallop and no friction rub.   No murmur heard. Pulmonary/Chest: Effort normal and breath sounds normal. No  respiratory distress. No wheezes.No rales. Left chest wall bandage noted from recent lung surgery.   Abdominal: Soft. Bowel sounds are normal. No distension. There  is no tenderness. There is no guarding.  Musculoskeletal: No edema or tenderness.  Lymphadenopathy: No cervical, axillary or supraclavicular adenopathy.  Neurological: Alert and oriented to person, place, and time. No cranial nerve deficit.  Skin: Skin is warm and dry. No rash noted. No erythema. No pallor.  Psychiatric: Affect and judgment normal.   Labs No visits with results within 3 Day(s) from this visit.  Latest known visit with results is:  Admission on 05/19/2018, Discharged on 05/23/2018  Component Date Value Ref Range Status  . Glucose-Capillary 05/19/2018 129* 70 - 99 mg/dL Final  . Glucose-Capillary 05/19/2018 159* 70 - 99 mg/dL Final  . Comment 1 05/19/2018 Notify RN   Final  . Comment 2 05/19/2018 Document in Chart   Final  . Sodium 05/19/2018 139  135 - 145 mmol/L Final  . Potassium 05/19/2018 4.6  3.5 - 5.1 mmol/L Final  . Chloride 05/19/2018 104  98 - 111 mmol/L Final   Please note change in reference range.  . CO2 05/19/2018 26  22 - 32 mmol/L Final  . Glucose, Bld 05/19/2018 167* 70 - 99 mg/dL Final   Please note change in reference range.  . BUN 05/19/2018 13  8 - 23 mg/dL Final   Please note change in reference range.  . Creatinine, Ser 05/19/2018 0.80  0.44 - 1.00 mg/dL Final  . Calcium 05/19/2018 8.6* 8.9 - 10.3 mg/dL Final  . Total Protein 05/19/2018 6.6  6.5 - 8.1 g/dL Final  . Albumin 05/19/2018 3.6  3.5 - 5.0 g/dL Final  . AST 05/19/2018 31  15 - 41 U/L Final  . ALT 05/19/2018 22  0 - 44 U/L Final   Please note change in reference range.  . Alkaline Phosphatase 05/19/2018 58  38 - 126 U/L Final  . Total Bilirubin 05/19/2018 0.6  0.3 - 1.2 mg/dL Final  . GFR calc non Af Amer 05/19/2018 >60  >60 mL/min Final  . GFR calc Af Amer 05/19/2018 >60  >60 mL/min Final   Comment: (NOTE) The eGFR has  been calculated using the CKD EPI equation. This calculation has not been validated in all clinical situations. eGFR's persistently <60 mL/min signify possible Chronic Kidney Disease.   Georgiann Hahn gap 05/19/2018 9  5 - 15 Final   Performed at [facility], [place] [address], [place], [place] [address]  . WBC 05/20/2018 13.0* 4.0 - 10.5 K/uL Final  . RBC 05/20/2018 3.61* 3.87 - 5.11 MIL/uL Final  . Hemoglobin 05/20/2018 10.3* 12.0 - 15.0 g/dL Final  . HCT 05/20/2018 32.5* 36.0 - 46.0 % Final  . MCV 05/20/2018 90.0  78.0 - 100.0 fL Final  . MCH 05/20/2018 28.5  26.0 - 34.0 pg Final  . MCHC 05/20/2018 31.7  30.0 - 36.0 g/dL Final  . RDW 05/20/2018 14.5  11.5 - 15.5 % Final  . Platelets 05/20/2018 212  150 - 400 K/uL Final   Performed at [facility], [place] [address] [place], [place] [address]  . O2 Content 05/20/2018 2.0  L/min Final  . Delivery systems 05/20/2018 NASAL CANNULA   Final  . pH, Arterial 05/20/2018 7.385  7.350 - 7.450 Final  . pCO2 arterial 05/20/2018 42.3  32.0 - 48.0 mmHg Final  . pO2, Arterial 05/20/2018 77.6* 83.0 - 108.0 mmHg Final  . Bicarbonate 05/20/2018 24.7  20.0 - 28.0 mmol/L Final  . Acid-Base Excess 05/20/2018 0.3  0.0 - 2.0 mmol/L Final  . O2 Saturation 05/20/2018 95.5  % Final  .  Patient temperature 05/20/2018 98.6   Final  . Collection site 05/20/2018 A-LINE   Final  . Drawn by 05/20/2018 [id number]   Final  . Sample type 05/20/2018 ARTERIAL DRAW   Final  . Chauncey Reading test (pass/fail) 05/20/2018 PASS  PASS Final  . Sodium 05/20/2018 135  135 - 145 mmol/L Final  . Potassium 05/20/2018 4.3  3.5 - 5.1 mmol/L Final  . Chloride 05/20/2018 99  98 - 111 mmol/L Final   Please note change in reference range.  . CO2 05/20/2018 26  22 - 32 mmol/L Final  . Glucose, Bld 05/20/2018 135* 70 - 99 mg/dL Final   Please note change in reference range.  . BUN 05/20/2018 11  8 - 23 mg/dL Final   Please note change in reference range.  . Creatinine, Ser 05/20/2018 0.72   0.44 - 1.00 mg/dL Final  . Calcium 05/20/2018 8.5* 8.9 - 10.3 mg/dL Final  . GFR calc non Af Amer 05/20/2018 >60  >60 mL/min Final  . GFR calc Af Amer 05/20/2018 >60  >60 mL/min Final   Comment: (NOTE) The eGFR has been calculated using the CKD EPI equation. This calculation has not been validated in all clinical situations. eGFR's persistently <60 mL/min signify possible Chronic Kidney Disease.   Georgiann Hahn gap 05/20/2018 10  5 - 15 Final   Performed at [facility], [place] [address] [place], [place] [address]  . Glucose-Capillary 05/19/2018 159* 70 - 99 mg/dL Final  . Comment 1 05/19/2018 Notify RN   Final  . Comment 2 05/19/2018 Document in Chart   Final  . Glucose-Capillary 05/19/2018 143* 70 - 99 mg/dL Final  . Comment 1 05/19/2018 Notify RN   Final  . Glucose-Capillary 05/20/2018 127* 70 - 99 mg/dL Final  . Glucose-Capillary 05/20/2018 127* 70 - 99 mg/dL Final  . Comment 1 05/20/2018 Notify RN   Final  . Comment 2 05/20/2018 Document in Chart   Final  . Glucose-Capillary 05/20/2018 150* 70 - 99 mg/dL Final  . Comment 1 05/20/2018 Notify RN   Final  . Comment 2 05/20/2018 Document in Chart   Final  . WBC 05/21/2018 8.0  4.0 - 10.5 K/uL Final  . RBC 05/21/2018 3.60* 3.87 - 5.11 MIL/uL Final  . Hemoglobin 05/21/2018 10.3* 12.0 - 15.0 g/dL Final  . HCT 05/21/2018 32.8* 36.0 - 46.0 % Final  . MCV 05/21/2018 91.1  78.0 - 100.0 fL Final  . MCH 05/21/2018 28.6  26.0 - 34.0 pg Final  . MCHC 05/21/2018 31.4  30.0 - 36.0 g/dL Final  . RDW 05/21/2018 14.6  11.5 - 15.5 % Final  . Platelets 05/21/2018 202  150 - 400 K/uL Final   Performed at [place] [address] [place], [place] [address]  . Sodium 05/21/2018 139  135 - 145 mmol/L Final  . Potassium 05/21/2018 4.5  3.5 - 5.1 mmol/L Final  . Chloride 05/21/2018 103  98 - 111 mmol/L Final   Please note change in reference range.  . CO2 05/21/2018 29  22 - 32 mmol/L Final  . Glucose, Bld 05/21/2018 122* 70 - 99 mg/dL  Final   Please note change in reference range.  . BUN 05/21/2018 8  8 - 23 mg/dL Final   Please note change in reference range.  . Creatinine, Ser 05/21/2018 0.70  0.44 - 1.00 mg/dL Final  . Calcium 05/21/2018 8.4* 8.9 - 10.3 mg/dL Final  . Total Protein 05/21/2018 5.8* 6.5 - 8.1 g/dL  Final  . Albumin 05/21/2018 2.9* 3.5 - 5.0 g/dL Final  . AST 05/21/2018 23  15 - 41 U/L Final  . ALT 05/21/2018 17  0 - 44 U/L Final   Please note change in reference range.  . Alkaline Phosphatase 05/21/2018 44  38 - 126 U/L Final  . Total Bilirubin 05/21/2018 0.6  0.3 - 1.2 mg/dL Final  . GFR calc non Af Amer 05/21/2018 >60  >60 mL/min Final  . GFR calc Af Amer 05/21/2018 >60  >60 mL/min Final   Comment: (NOTE) The eGFR has been calculated using the CKD EPI equation. This calculation has not been validated in all clinical situations. eGFR's persistently <60 mL/min signify possible Chronic Kidney Disease.   Georgiann Hahn gap 05/21/2018 7  5 - 15 Final   Performed at [facility], [place] [address], [place], [place] [address]  . Glucose-Capillary 05/20/2018 104* 70 - 99 mg/dL Final  . Comment 1 05/20/2018 Notify RN   Final  . Comment 2 05/20/2018 Document in Chart   Final  . Glucose-Capillary 05/21/2018 126* 70 - 99 mg/dL Final  . Comment 1 05/21/2018 Notify RN   Final  . Glucose-Capillary 05/21/2018 130* 70 - 99 mg/dL Final  . Comment 1 05/21/2018 Notify RN   Final  . Glucose-Capillary 05/21/2018 86  70 - 99 mg/dL Final  . Comment 1 05/21/2018 Notify RN   Final  . Comment 2 05/21/2018 Document in Chart   Final  . Glucose-Capillary 05/21/2018 155* 70 - 99 mg/dL Final  . Comment 1 05/21/2018 Notify RN   Final  . Comment 2 05/21/2018 Document in Chart   Final  . Glucose-Capillary 05/21/2018 186* 70 - 99 mg/dL Final  . Glucose-Capillary 05/22/2018 123* 70 - 99 mg/dL Final  . Glucose-Capillary 05/22/2018 143* 70 - 99 mg/dL Final  . Glucose-Capillary 05/22/2018 146* 70 - 99 mg/dL Final  .  Glucose-Capillary 05/22/2018 136* 70 - 99 mg/dL Final  . Glucose-Capillary 05/22/2018 175* 70 - 99 mg/dL Final  . Glucose-Capillary 05/22/2018 135* 70 - 99 mg/dL Final  . Glucose-Capillary 05/23/2018 172* 70 - 99 mg/dL Final  . Comment 1 05/23/2018 Notify RN   Final  . Comment 2 05/23/2018 Document in Chart   Final  . Glucose-Capillary 05/23/2018 106* 70 - 99 mg/dL Final  . Comment 1 05/23/2018 Notify RN   Final  . Comment 2 05/23/2018 Document in Chart   Final     Pathology Orders Placed This Encounter  Procedures  . Wheelchair  . MR Brain W Wo Contrast    Standing Status:   Future    Standing Expiration Date:   06/05/2019    Order Specific Question:   If indicated for the ordered procedure, I authorize the administration of contrast media per Radiology protocol    Answer:   Yes    Order Specific Question:   What is the patient's sedation requirement?    Answer:   No Sedation    Order Specific Question:   Does the patient have a pacemaker or implanted devices?    Answer:   No    Order Specific Question:   Use SRS Protocol?    Answer:   No    Order Specific Question:   Radiology Contrast Protocol - do NOT remove file path    Answer:   [email or web address]    Order Specific Question:   Preferred imaging location?    Answer:   [facility] (table limit-350lbs)  Zoila Shutter MD

## 2018-06-07 ENCOUNTER — Encounter (HOSPITAL_COMMUNITY): Payer: Self-pay | Admitting: *Deleted

## 2018-06-07 NOTE — Progress Notes (Signed)
I spoke with Varney Biles in pathology and Foundation one ordered on accession # [id number]. Stage IA2, Dx: C34.90

## 2018-06-08 ENCOUNTER — Other Ambulatory Visit: Payer: Self-pay | Admitting: Thoracic Surgery (Cardiothoracic Vascular Surgery)

## 2018-06-08 DIAGNOSIS — C3490 Malignant neoplasm of unspecified part of unspecified bronchus or lung: Secondary | ICD-10-CM

## 2018-06-12 ENCOUNTER — Ambulatory Visit (HOSPITAL_COMMUNITY)
Admission: RE | Admit: 2018-06-12 | Discharge: 2018-06-12 | Disposition: A | Discharge status: Home / Self Care | Payer: Medicare Other | Source: Ambulatory Visit | Attending: Internal Medicine | Admitting: Internal Medicine

## 2018-06-12 DIAGNOSIS — C3490 Malignant neoplasm of unspecified part of unspecified bronchus or lung: Secondary | ICD-10-CM | POA: Insufficient documentation

## 2018-06-12 MED ORDER — GADOBENATE DIMEGLUMINE 529 MG/ML IV SOLN
15.0000 mL | Freq: Once | INTRAVENOUS | Status: AC | PRN
  Administered 2018-06-12: 15 mL via INTRAVENOUS

## 2018-06-13 ENCOUNTER — Other Ambulatory Visit: Payer: Self-pay

## 2018-06-13 ENCOUNTER — Ambulatory Visit (HOSPITAL_COMMUNITY)
Admission: RE | Admit: 2018-06-13 | Discharge: 2018-06-13 | Disposition: A | Discharge status: Home / Self Care | Payer: Medicare Other | Source: Ambulatory Visit | Attending: Internal Medicine | Admitting: Internal Medicine

## 2018-06-13 ENCOUNTER — Encounter (HOSPITAL_COMMUNITY): Payer: Self-pay | Admitting: *Deleted

## 2018-06-13 ENCOUNTER — Encounter (HOSPITAL_COMMUNITY)
Admission: RE | Disposition: A | Discharge status: Home / Self Care | Payer: Self-pay | Source: Ambulatory Visit | Attending: Internal Medicine

## 2018-06-13 ENCOUNTER — Ambulatory Visit
Admission: RE | Admit: 2018-06-13 | Discharge: 2018-06-13 | Disposition: A | Discharge status: Home / Self Care | Payer: Medicare Other | Source: Ambulatory Visit | Attending: Thoracic Surgery (Cardiothoracic Vascular Surgery) | Admitting: Thoracic Surgery (Cardiothoracic Vascular Surgery)

## 2018-06-13 ENCOUNTER — Encounter: Payer: Self-pay | Admitting: Thoracic Surgery (Cardiothoracic Vascular Surgery)

## 2018-06-13 ENCOUNTER — Ambulatory Visit (INDEPENDENT_AMBULATORY_CARE_PROVIDER_SITE_OTHER): Payer: Self-pay | Admitting: Thoracic Surgery (Cardiothoracic Vascular Surgery)

## 2018-06-13 VITALS — BP 104/60 | HR 88 | Resp 16 | Ht 66.0 in | Wt 165.0 lb

## 2018-06-13 DIAGNOSIS — Z79891 Long term (current) use of opiate analgesic: Secondary | ICD-10-CM | POA: Insufficient documentation

## 2018-06-13 DIAGNOSIS — Z87891 Personal history of nicotine dependence: Secondary | ICD-10-CM | POA: Diagnosis not present

## 2018-06-13 DIAGNOSIS — Z882 Allergy status to sulfonamides status: Secondary | ICD-10-CM | POA: Diagnosis not present

## 2018-06-13 DIAGNOSIS — Z8572 Personal history of non-Hodgkin lymphomas: Secondary | ICD-10-CM | POA: Insufficient documentation

## 2018-06-13 DIAGNOSIS — K222 Esophageal obstruction: Secondary | ICD-10-CM | POA: Diagnosis not present

## 2018-06-13 DIAGNOSIS — K449 Diaphragmatic hernia without obstruction or gangrene: Secondary | ICD-10-CM | POA: Diagnosis not present

## 2018-06-13 DIAGNOSIS — Z9842 Cataract extraction status, left eye: Secondary | ICD-10-CM | POA: Diagnosis not present

## 2018-06-13 DIAGNOSIS — E119 Type 2 diabetes mellitus without complications: Secondary | ICD-10-CM | POA: Diagnosis not present

## 2018-06-13 DIAGNOSIS — K2950 Unspecified chronic gastritis without bleeding: Secondary | ICD-10-CM | POA: Insufficient documentation

## 2018-06-13 DIAGNOSIS — R1314 Dysphagia, pharyngoesophageal phase: Secondary | ICD-10-CM | POA: Diagnosis not present

## 2018-06-13 DIAGNOSIS — J9811 Atelectasis: Secondary | ICD-10-CM | POA: Diagnosis not present

## 2018-06-13 DIAGNOSIS — F329 Major depressive disorder, single episode, unspecified: Secondary | ICD-10-CM | POA: Insufficient documentation

## 2018-06-13 DIAGNOSIS — R1310 Dysphagia, unspecified: Secondary | ICD-10-CM | POA: Diagnosis not present

## 2018-06-13 DIAGNOSIS — K219 Gastro-esophageal reflux disease without esophagitis: Secondary | ICD-10-CM | POA: Insufficient documentation

## 2018-06-13 DIAGNOSIS — Z79899 Other long term (current) drug therapy: Secondary | ICD-10-CM | POA: Insufficient documentation

## 2018-06-13 DIAGNOSIS — C3490 Malignant neoplasm of unspecified part of unspecified bronchus or lung: Secondary | ICD-10-CM

## 2018-06-13 DIAGNOSIS — C884 Extranodal marginal zone B-cell lymphoma of mucosa-associated lymphoid tissue [MALT-lymphoma]: Secondary | ICD-10-CM

## 2018-06-13 DIAGNOSIS — Z7984 Long term (current) use of oral hypoglycemic drugs: Secondary | ICD-10-CM | POA: Diagnosis not present

## 2018-06-13 DIAGNOSIS — R6881 Early satiety: Secondary | ICD-10-CM

## 2018-06-13 DIAGNOSIS — E785 Hyperlipidemia, unspecified: Secondary | ICD-10-CM | POA: Insufficient documentation

## 2018-06-13 DIAGNOSIS — J90 Pleural effusion, not elsewhere classified: Secondary | ICD-10-CM | POA: Diagnosis not present

## 2018-06-13 DIAGNOSIS — C3432 Malignant neoplasm of lower lobe, left bronchus or lung: Secondary | ICD-10-CM | POA: Insufficient documentation

## 2018-06-13 DIAGNOSIS — Z902 Acquired absence of lung [part of]: Secondary | ICD-10-CM

## 2018-06-13 DIAGNOSIS — Z9841 Cataract extraction status, right eye: Secondary | ICD-10-CM | POA: Diagnosis not present

## 2018-06-13 DIAGNOSIS — R1319 Other dysphagia: Secondary | ICD-10-CM

## 2018-06-13 HISTORY — PX: ESOPHAGOGASTRODUODENOSCOPY: SHX5428

## 2018-06-13 HISTORY — PX: MALONEY DILATION: SHX5535

## 2018-06-13 LAB — GLUCOSE, CAPILLARY: Glucose-Capillary: 131 mg/dL — ABNORMAL HIGH (ref 70–99)

## 2018-06-13 SURGERY — EGD (ESOPHAGOGASTRODUODENOSCOPY)
Anesthesia: Moderate Sedation

## 2018-06-13 MED ORDER — ONDANSETRON HCL 4 MG/2ML IJ SOLN
INTRAMUSCULAR | Status: AC
  Filled 2018-06-13: qty 2

## 2018-06-13 MED ORDER — LIDOCAINE VISCOUS HCL 2 % MT SOLN
OROMUCOSAL | Status: DC | PRN
  Administered 2018-06-13: 4 mL via OROMUCOSAL

## 2018-06-13 MED ORDER — ONDANSETRON HCL 4 MG PO TABS: 4.0000 mg | ORAL_TABLET | Freq: Three times a day (TID) | ORAL | 0 refills | Status: DC | PRN

## 2018-06-13 MED ORDER — MIDAZOLAM HCL 5 MG/5ML IJ SOLN
INTRAMUSCULAR | Status: DC | PRN
  Administered 2018-06-13 (×5): 1 mg via INTRAVENOUS

## 2018-06-13 MED ORDER — SODIUM CHLORIDE 0.9 % IV SOLN
INTRAVENOUS | Status: DC
  Administered 2018-06-13: 07:00:00 via INTRAVENOUS

## 2018-06-13 MED ORDER — STERILE WATER FOR IRRIGATION IR SOLN
Status: DC | PRN
  Administered 2018-06-13: 100 mL

## 2018-06-13 MED ORDER — MEPERIDINE HCL 100 MG/ML IJ SOLN
INTRAMUSCULAR | Status: DC | PRN
  Administered 2018-06-13: 15 mg via INTRAVENOUS
  Administered 2018-06-13: 25 mg via INTRAVENOUS

## 2018-06-13 MED ORDER — ONDANSETRON HCL 4 MG/2ML IJ SOLN
INTRAMUSCULAR | Status: DC | PRN
  Administered 2018-06-13: 4 mg via INTRAVENOUS

## 2018-06-13 MED ORDER — LIDOCAINE VISCOUS HCL 2 % MT SOLN
OROMUCOSAL | Status: AC
  Filled 2018-06-13: qty 15

## 2018-06-13 MED ORDER — MIDAZOLAM HCL 5 MG/5ML IJ SOLN
INTRAMUSCULAR | Status: AC
  Filled 2018-06-13: qty 10

## 2018-06-13 MED ORDER — MEPERIDINE HCL 100 MG/ML IJ SOLN
INTRAMUSCULAR | Status: AC
  Filled 2018-06-13: qty 2

## 2018-06-13 NOTE — H&P (Signed)
@LOGO @   Primary Care Physician:  Manon Hilding, MD Primary Gastroenterologist:  Dr. Gala Romney  Pre-Procedure History & Physical: HPI:  Monica [name] is a 77 y.o. female here for evaluation for recurrent esophageal  Esophageal dysphagia. History of a  Known Schatzki's ring.History of MALToma. Recently  Diagnosed lung cancer.  Past Medical History:  Diagnosis Date  . Cancer (HCC)    NHL, Malt Lymphoma  . Depression   . DM (diabetes mellitus) ([place])    TYPE  2  . Dysphagia   . Dysrhythmia    History of palpatations  . GERD (gastroesophageal reflux disease)    INGESTION   . Heart palpitations   . Hiatal hernia   . Hyperlipidemia   . Interstitial cystitis   . 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 Sinusitis     Past Surgical History:  Procedure Laterality Date  . ABDOMINAL HYSTERECTOMY    . BIOPSY  09/01/2016   Procedure: BIOPSY;  Surgeon: Daneil Dolin, MD;  [facility];  Service: Endoscopy;;  gastric  . BLADDER SURGERY    . CATARACT EXTRACTION, BILATERAL    . COLONOSCOPY     Dr. Lindalou Hose 2009: Normal per PCP notes  . ESOPHAGOGASTRODUODENOSCOPY     RMR: Prominant Schzgzki ring/component of peptic stricture status post dilation and disruption as described above, otherwise norma esophagus, moderate-sized hiatal hernia, antal pyloric channel, and posterier bulbar erosions, otherwise unremarkable stomach, D1 and D2 . Inflammatory findings on the stomach and duodenum will likely be related to aspirin effect. We need to rule out Helicobacter pylor  . ESOPHAGOGASTRODUODENOSCOPY N/A 03/10/2015   Dr. 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 Pathology with lymphoid population of stomach, slight atypia  . ESOPHAGOGASTRODUODENOSCOPY N/A 02/03/2016   Dr. Gala Romney: Schatzki ring noted at GE junction, dilated with 72 and then 2 Gideon dilator. Large hiatal hernia. 6 x 7 cm nodular  geographically ulcerated mucosa, biopsy c/w MALToma  . ESOPHAGOGASTRODUODENOSCOPY N/A 04/08/2016   Dr. Gala Romney: moderate Schatzki's ring/web s/p dilation, large hiatal hernia, localized area of gastric lymphoma visualized and appeared to be much improved. normal second portion of the duodenum. No specimens collected. Esophageal lumen notably tighted up significantly since her dilation in April of this year.   . ESOPHAGOGASTRODUODENOSCOPY N/A 08/04/2016   Procedure: ESOPHAGOGASTRODUODENOSCOPY (EGD);  Surgeon: Daneil Dolin, MD;  [facility];  Service: Endoscopy;  Laterality: N/A;  7:30 am  . ESOPHAGOGASTRODUODENOSCOPY N/A 09/01/2016   Procedure: ESOPHAGOGASTRODUODENOSCOPY (EGD);  Surgeon: Daneil Dolin, MD;  [facility];  Service: Endoscopy;  Laterality: N/A;  830   . ESOPHAGOGASTRODUODENOSCOPY N/A 05/10/2017   Dr. Gala Romney: Moderate Schatzki ring at the GE junction, status post dilation with 68 [place].  Medium sized hiatal hernia.  Few localized erosions in the gastric antrum.  Stomach biopsy showed chronic gastritis, no H. pylori.  No atypical lymphoid infiltrates or other features of lymphoproliferative process  . LOBECTOMY Left 05/19/2018   Procedure: LEFT LOWER LOBECTOMY;  Surgeon: [name], MD;  Location: [facility];  Service: Thoracic;  Laterality: Left;  Marland Kitchen MALONEY DILATION N/A 03/10/2015   Procedure: Venia Minks DILATION;  Surgeon: Daneil Dolin, MD;  [facility];  Service: Endoscopy;  Laterality: N/A;  . Venia Minks DILATION N/A 02/03/2016   Procedure: Venia Minks DILATION;  Surgeon: Daneil Dolin, MD;  [facility];  Service: Endoscopy;  Laterality: N/A;  . MALONEY  DILATION N/A 04/08/2016   Procedure: Venia Minks DILATION;  Surgeon: Daneil Dolin, MD;  [facility];  Service: Endoscopy;  Laterality: N/A;  . Venia Minks DILATION N/A 05/10/2017   Procedure: Venia Minks DILATION;  Surgeon: Daneil Dolin, MD;  [facility];  Service: Endoscopy;   Laterality: N/A;  . VIDEO ASSISTED THORACOSCOPY (VATS)/WEDGE RESECTION Left 05/19/2018   Procedure: VIDEO ASSISTED THORACOSCOPY (VATS)/WEDGE RESECTION;  Surgeon: [name], MD;  Location: [facility];  Service: Thoracic;  Laterality: Left;    Prior to Admission medications   Medication Sig Start Date End Date Taking? Authorizing Provider  acetaminophen (TYLENOL) 500 MG tablet Take 2 tablets (1,000 mg total) by mouth every 6 (six) hours. 05/23/18  Yes Conte, Tessa N, PA-C  amiodarone (PACERONE) 200 MG tablet Take 1 tab (200 mg) twice a day for 2 weeks then take 1 tab (200 mg) thereafter for a month or until seen by cardiology. 05/23/18  Yes Conte, Tessa N, PA-C  Calcium-Magnesium-Vitamin D (CALCIUM 500 PO) Take 500 mg by mouth every other day.    Yes [provider]  citalopram (CELEXA) 20 MG tablet Take 20 mg by mouth daily.  12/10/14  Yes [provider]  doxycycline (VIBRAMYCIN) 100 MG capsule TAKE 1 CAPSULE BY MOUTH EVERY OTHER DAY BEFORE BED AND TAKE CALCIUM CITRATE ON OPPOSITE DAYS 03/07/18  Yes [provider]  metFORMIN (GLUCOPHAGE-XR) 500 MG 24 hr tablet Take 500 mg 2 (two) times daily by mouth.  12/10/14  Yes [provider]  metoprolol tartrate (LOPRESSOR) 25 MG tablet Take 25 mg by mouth every evening. 12/07/17  Yes [provider]  Multiple Vitamin (MULTIVITAMIN WITH MINERALS) TABS tablet Take 1 tablet by mouth daily.   Yes [provider]  oxyCODONE (OXY IR/ROXICODONE) 5 MG immediate release tablet Take 1 tablet (5 mg total) by mouth every 6 (six) hours as needed for severe pain. Patient taking differently: Take 5 mg by mouth at bedtime as needed for severe pain.  05/23/18  Yes Harriet Pho, Tessa N, PA-C  pantoprazole (PROTONIX) 40 MG tablet Take 1 tablet (40 mg total) by mouth daily before breakfast. 05/18/18  Yes Annitta Needs, NP  pravastatin (PRAVACHOL) 40 MG tablet Take 40 mg by mouth at bedtime.  07/12/16  Yes [provider]   [place]. Devices MISC Please provide patient with lightweight transport wheelchair. 06/06/18   Higgs, Mathis Dad, MD    Allergies as of 05/02/2018 - Review Complete 05/02/2018  Allergen Reaction Noted  . Sulfonamide derivatives Hives   . Sulfur  06/04/2016    Family History  Problem Relation Age of Onset  . Heart attack Father   . Dementia Father   . Diabetes Sister   . Diabetes Brother   . Diabetes Sister   . Colon cancer Neg Hx     Social History   Socioeconomic History  . Marital status: Married    Spouse name: Not on file  . Number of children: Not on file  . Years of education: Not on file  . Highest education level: Not on file  Occupational History  . Not on file  Social Needs  . Financial resource strain: Not on file  . Food insecurity:    Worry: Not on file    Inability: Not on file  . Transportation needs:    Medical: Not on file    Non-medical: Not on file  Tobacco Use  . Smoking status: Former Smoker    Packs/day: 0.75  Years: 57.00    Pack years: 42.75    Types: Cigarettes    Last attempt to quit: 05/23/2018    Years since quitting: 0.0  . Smokeless tobacco: Never Used  . Tobacco comment: trying to quit, wearing patch  Substance and Sexual Activity  . Alcohol use: No    Alcohol/week: 0.0 standard drinks  . Drug use: No  . Sexual activity: Not on file  Lifestyle  . Physical activity:    Days per week: Not on file    Minutes per session: Not on file  . Stress: Not on file  Relationships  . Social connections:    Talks on phone: Not on file    Gets together: Not on file    Attends religious service: Not on file    Active member of club or organization: Not on file    Attends meetings of clubs or organizations: Not on file    Relationship status: Not on file  . Intimate partner violence:    Fear of current or ex partner: Not on file    Emotionally abused: Not on file    Physically abused: Not on file    Forced sexual activity: Not on file  Other  Topics Concern  . Not on file  Social History Narrative  . Not on file    Review of Systems: See HPI, otherwise negative ROS  Physical Exam: BP (!) 152/72   Pulse 70   Temp 97.8 F (36.6 C) (Oral)   Resp 14   Ht 5\' 6"  (1.676 m)   Wt 72.1 kg   SpO2 97%   BMI 25.66 kg/m  General:   Alert,  Well-developed, well-nourished, pleasant and cooperative in NAD Neck:  Supple; no masses or thyromegaly. No significant cervical adenopathy. Lungs:  Clear throughout to auscultation.   No wheezes, crackles, or rhonchi. No acute distress. Heart:  Regular rate and rhythm; no murmurs, clicks, rubs,  or gallops. Abdomen: Non-distended, normal bowel sounds.  Soft and nontender without appreciable mass or hepatosplenomegaly.  Pulses:  Normal pulses noted. Extremities:  Without clubbing or edema.  Impression/Plan:  Pleasant 77 year old lady with a hist History of a Schatzki's ring. Recurrent esophageal dysphagia.  History of MALToma. EGD to further evaluate.  The risks, benefits, limitations, alternatives and imponderables have been reviewed with the patient. Potential for esophageal dilation, biopsy, etc. have also been reviewed.  Questions have been answered. All parties agreeable.     Notice: This dictation was prepared with Dragon dictation along with smaller phrase technology. Any transcriptional errors that result from this process are unintentional and may not be corrected upon review.

## 2018-06-13 NOTE — Op Note (Signed)
[facility] Patient Name: Monica [name] Procedure Date: 06/13/2018 7:08 AM MRN: [id number] Date of Birth: 03/30/1941 Attending MD: Norvel Richards , MD CSN: [id number] Age: 77 Admit Type: Outpatient Procedure:                Upper GI endoscopy Indications:              Dysphagia Providers:                Norvel Richards, MD, Rosina Lowenstein, RN, Aram Candela Referring MD:              Medicines:                Meperidine 40 mg IV, Midazolam 5 mg IV, Ondansetron                            4 mg IV Complications:            No immediate complications. Estimated Blood Loss:     Estimated blood loss was minimal. Procedure:                Pre-Anesthesia Assessment:                           - Prior to the procedure, a History and Physical                            was performed, and patient medications and                            allergies were reviewed. The patient's tolerance of                            previous anesthesia was also reviewed. The risks                            and benefits of the procedure and the sedation                            options and risks were discussed with the patient.                            All questions were answered, and informed consent                            was obtained. Prior Anticoagulants: The patient has                            taken no previous anticoagulant or antiplatelet                            agents. ASA Grade Assessment: II - A patient with  mild systemic disease. After reviewing the risks                            and benefits, the patient was deemed in                            satisfactory condition to undergo the procedure.                           After obtaining informed consent, the endoscope was                            passed under direct vision. Throughout the                            procedure, the patient's blood pressure, pulse, and                           oxygen saturations were monitored continuously. The                            GIF-H190 ([id number]) was introduced through the                            mouth, and advanced to the second part of duodenum.                            The upper GI endoscopy was accomplished without                            difficulty. The patient tolerated the procedure                            well. Scope In: 7:57:46 AM Scope Out: 8:08:57 AM Total Procedure Duration: 0 hours 11 minutes 11 seconds  Findings:      A moderate Schatzki ring was found at the gastroesophageal junction. No       esophagitis. The scope was withdrawn. Dilation was performed with a       Maloney dilator with mild resistance at 48 Fr. The dilation site was       examined following endoscope reinsertion and showed mild mucosal       disruption. Estimated blood loss was minimal. The scope was withdrawn.       Dilation was performed with a Maloney dilator with mild resistance at 56       Fr. The dilation site was examined following endoscope reinsertion and       showed moderate mucosal disruption. Estimated blood loss was minimal.      A large hiatal hernia was present. focal area about 4 x 4 centimeters       along the greater curvature, somewhat erythematous and thickened mucosa.       This was biopsied with a cold forceps for histology. Estimated blood       loss was minimal.      The duodenal bulb and second portion of the duodenum were normal.      The exam  was otherwise without abnormality. Impression:               - Moderate Schatzki ring. Dilated.                           - Large hiatal hernia. Biopsied.                           - Normal duodenal bulb and second portion of the                            duodenum.                           - The examination was otherwise normal. Moderate Sedation:      Moderate (conscious) sedation was administered by the endoscopy nurse       and supervised by  the endoscopist. The following parameters were       monitored: oxygen saturation, heart rate, blood pressure, respiratory       rate, EKG, adequacy of pulmonary ventilation, and response to care.       Total physician intraservice time was 25 minutes. Recommendation:           - Patient has a contact number available for                            emergencies. The signs and symptoms of potential                            delayed complications were discussed with the                            patient. Return to normal activities tomorrow.                            Written discharge instructions were provided to the                            patient.                           - Resume previous diet.                           - Continue present medications.                           - Repeat upper endoscopy in 1 year for surveillance.                           - Return to GI clinic in 3 months. Follow-up on                            pathology. Procedure Code(s):        --- Professional ---  16109, Esophagogastroduodenoscopy, flexible,                            transoral; with biopsy, single or multiple                           43450, Dilation of esophagus, by unguided sound or                            bougie, single or multiple passes                           G0500, Moderate sedation services provided by the                            same physician or other qualified health care                            professional performing a gastrointestinal                            endoscopic service that sedation supports,                            requiring the presence of an independent trained                            observer to assist in the monitoring of the                            patient's level of consciousness and physiological                            status; initial 15 minutes of intra-service time;                            patient age 59  years or older (additional time may                            be reported with [phone], as appropriate)                           [phone], Moderate sedation services provided by the                            same physician or other qualified health care                            professional performing the diagnostic or                            therapeutic service that the sedation supports,                            requiring the presence  of an independent trained                            observer to assist in the monitoring of the                            patient's level of consciousness and physiological                            status; each additional 15 minutes intraservice                            time (List separately in addition to code for                            primary service) Diagnosis Code(s):        --- Professional ---                           K22.2, Esophageal obstruction                           K44.9, Diaphragmatic hernia without obstruction or                            gangrene                           R13.10, Dysphagia, unspecified CPT copyright 2017 American Medical Association. All rights reserved. The codes documented in this report are preliminary and upon coder review may  be revised to meet current compliance requirements. Cristopher Estimable. Kalim Kissel, MD Norvel Richards, MD 06/13/2018 8:20:01 AM This report has been signed electronically. Number of Addenda: 0

## 2018-06-13 NOTE — Progress Notes (Signed)
Per Dr. Walden Field , Patient needs aids to help with ambulation. A walker and cane are not sufficient. A wheelchair is needed due to weakness and shortness of breath.

## 2018-06-13 NOTE — Progress Notes (Signed)
[address]       [place] [address]             [phone]    HPI: Monica [name] returns for scheduled postoperative follow-up visit  Monica [name] is a 77 year old woman with history of tobacco abuse, gastric MALT lymphoma, type 2 diabetes, Schatzki's ring, hiatal hernia, depression, and interstitial cystitis.  She had a PET CT for follow-up of her gastric MALT lymphoma.  It showed a 1.3 cm left lower lobe nodule that was hypermetabolic with an SUV of 4.6.  There was no evidence of regional or distant metastases.  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  Postoperatively she had atrial fibrillation and was started on amiodarone.  She was seen in consultation by Dr. Marlou Porch.  She has some incisional discomfort but is not taking anything for it.  Her primary complaint is nausea.  She thinks it may be due to an antibiotic that starts with the letter M.  She says that food does not taste good.  She saw Dr. Walden Field.  No adjuvant therapy was recommended.  Past Medical History:  Diagnosis Date  . Cancer (HCC)    NHL, Malt Lymphoma  . Depression   . DM (diabetes mellitus) ([place])    TYPE  2  . Dysphagia   . Dysrhythmia    History of palpatations  . GERD (gastroesophageal reflux disease)    INGESTION   . Heart palpitations   . Hiatal hernia   . Hyperlipidemia   . Interstitial cystitis   . 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 Sinusitis     Current Outpatient Medications  Medication Sig Dispense Refill  . acetaminophen (TYLENOL) 500 MG tablet Take 2 tablets (1,000 mg total) by mouth every 6 (six) hours. 30 tablet 0  . Calcium-Magnesium-Vitamin D (CALCIUM 500 PO) Take 500 mg by mouth every other day.     . citalopram (CELEXA) 20 MG tablet Take 20 mg by mouth daily.     Marland Kitchen doxycycline (VIBRAMYCIN) 100 MG capsule TAKE 1 CAPSULE BY MOUTH EVERY OTHER DAY BEFORE BED AND TAKE CALCIUM  CITRATE ON OPPOSITE DAYS  5  . metFORMIN (GLUCOPHAGE-XR) 500 MG 24 hr tablet Take 500 mg 2 (two) times daily by mouth.     . metoprolol tartrate (LOPRESSOR) 25 MG tablet Take 25 mg by mouth every evening.    . Misc. Devices MISC Please provide patient with lightweight transport wheelchair. 1 each 0  . Multiple Vitamin (MULTIVITAMIN WITH MINERALS) TABS tablet Take 1 tablet by mouth daily.    . pantoprazole (PROTONIX) 40 MG tablet Take 1 tablet (40 mg total) by mouth daily before breakfast. 90 tablet 3  . pravastatin (PRAVACHOL) 40 MG tablet Take 40 mg by mouth at bedtime.     . ondansetron (ZOFRAN) 4 MG tablet Take 1 tablet (4 mg total) by mouth every 8 (eight) hours as needed for nausea or vomiting. 20 tablet 0  . oxyCODONE (OXY IR/ROXICODONE) 5 MG immediate release tablet Take 1 tablet (5 mg total) by mouth every 6 (six) hours as needed for severe pain. (Patient not taking: Reported on 06/13/2018) 20 tablet 0   No current facility-administered medications for this visit.     Physical Exam BP 104/60 (BP Location: Right Arm, Patient Position: Sitting, Cuff Size: Large)   Pulse 88   Resp 16   Ht 5'  6" (1.676 m)   Wt 165 lb (74.8 kg)   SpO2 93% Comment: RA  BMI 26.5 kg/m  77 year old woman in no acute distress Alert and oriented x3 with no focal deficits Lungs diminished at left base, otherwise clear Cardiac regular rate and rhythm normal S1 and S2 Incisions healing well  Diagnostic Tests: CHEST - 2 VIEW  COMPARISON:  05/22/2018  FINDINGS: Heart size and pulmonary vascularity are normal.  Complete resolution of the small bilateral pleural effusions. Minimal residual atelectasis at the left lung base laterally.  Large chronic hiatal hernia.  No acute bone abnormality.  Aortic atherosclerosis.  No pneumothorax.  IMPRESSION: 1. Interval complete resolution of the small bilateral pleural effusions. 2. Improved aeration at both lung bases with only minimal  residual atelectasis at the left lung base. 3.  Aortic Atherosclerosis (ICD10-I70.0).   Electronically Signed   By: Lorriane Shire M.D.   On: 06/13/2018 11:24 I personally reviewed the chest x-ray images and concur with the findings noted above  Impression: Monica [name] is a 77 year old woman with a history of tobacco abuse who underwent a thoracoscopic left lower lobectomy for a stage IA squamous cell carcinoma on 05/19/2018.  Postoperative atrial fibrillation-was in sinus rhythm at the time of discharge.  She was discharged home on amiodarone.  Nausea-this is her primary complaint.  She thinks she may have received an antibiotic that starts with a letter M.  I do not think that was the case.  I suspect that amiodarone is the cause of her nausea.  She is in sinus rhythm today in about a month out from surgery.  We will stop the amiodarone and see if that makes a difference.  I also gave her a prescription for Zofran 4 mg p.o. every 8 hours as needed, 20 tablets, no refills.  Tobacco abuse-has not smoked in 4 weeks.  I congratulated her on this and emphasized the importance of continued abstinence.  Plan: DC amiodarone Zofran as needed Return in 6 weeks with PA and lateral chest x-ray  [name], MD [facility] [phone]

## 2018-06-13 NOTE — Discharge Instructions (Signed)
EGD Discharge instructions Please read the instructions outlined below and refer to this sheet in the next few weeks. These discharge instructions provide you with general information on caring for yourself after you leave the hospital. Your doctor may also give you specific instructions. While your treatment has been planned according to the most current medical practices available, unavoidable complications occasionally occur. If you have any problems or questions after discharge, please call your doctor. ACTIVITY  You may resume your regular activity but move at a slower pace for the next 24 hours.   Take frequent rest periods for the next 24 hours.   Walking will help expel (get rid of) the air and reduce the bloated feeling in your abdomen.   No driving for 24 hours (because of the anesthesia (medicine) used during the test).   You may shower.   Do not sign any important legal documents or operate any machinery for 24 hours (because of the anesthesia used during the test).  NUTRITION  Drink plenty of fluids.   You may resume your normal diet.   Begin with a light meal and progress to your normal diet.   Avoid alcoholic beverages for 24 hours or as instructed by your caregiver.  MEDICATIONS  You may resume your normal medications unless your caregiver tells you otherwise.  WHAT YOU CAN EXPECT TODAY  You may experience abdominal discomfort such as a feeling of fullness or gas pains.  FOLLOW-UP  Your doctor will discuss the results of your test with you.  SEEK IMMEDIATE MEDICAL ATTENTION IF ANY OF THE FOLLOWING OCCUR:  Excessive nausea (feeling sick to your stomach) and/or vomiting.   Severe abdominal pain and distention (swelling).   Trouble swallowing.   Temperature over 101 F (37.8 C).   Rectal bleeding or vomiting of blood.    Office visit with [place] in 3 months    Sep 19, 2018 @ 9:30 am  Further recommendations to follow pending review of pathology  report  Esophageal Dilatation Esophageal dilatation is a procedure to open a blocked or narrowed part of the esophagus. The esophagus is the long tube in your throat that carries food and liquid from your mouth to your stomach. The procedure is also called esophageal dilation. You may need this procedure if you have a buildup of scar tissue in your esophagus that makes it difficult, painful, or even impossible to swallow. This can be caused by gastroesophageal reflux disease (GERD). In rare cases, people need this procedure because they have cancer of the esophagus or a problem with the way food moves through the esophagus. Sometimes you may need to have another dilatation to enlarge the opening of the esophagus gradually. Tell a health care provider about:  Any allergies you have.  All medicines you are taking, including vitamins, herbs, eye drops, creams, and over-the-counter medicines.  Any problems you or family members have had with anesthetic medicines.  Any blood disorders you have.  Any surgeries you have had.  Any medical conditions you have.  Any antibiotic medicines you are required to take before dental procedures. What are the risks? Generally, this is a safe procedure. However, problems can occur and include:  Bleeding from a tear in the lining of the esophagus.  A hole (perforation) in the esophagus.  What happens before the procedure?  Do not eat or drink anything after midnight on the night before the procedure or as directed by your health care provider.  Ask your health care provider about changing  or stopping your regular medicines. This is especially important if you are taking diabetes medicines or blood thinners.  Plan to have someone take you home after the procedure. What happens during the procedure?  You will be given a medicine that makes you relaxed and sleepy (sedative).  A medicine may be sprayed or gargled to numb the back of the throat.  Your  health care provider can use various instruments to do an esophageal dilatation. During the procedure, the instrument used will be placed in your mouth and passed down into your esophagus. Options include: ? Simple dilators. This instrument is carefully placed in the esophagus to stretch it. ? Guided wire bougies. In this method, a flexible tube (endoscope) is used to insert a wire into the esophagus. The dilator is passed over this wire to enlarge the esophagus. Then the wire is removed. ? Balloon dilators. An endoscope with a small balloon at the end is passed down into the esophagus. Inflating the balloon gently stretches the esophagus and opens it up. What happens after the procedure?  Your blood pressure, heart rate, breathing rate, and blood oxygen level will be monitored often until the medicines you were given have worn off.  Your throat may feel slightly sore and will probably still feel numb. This will improve slowly over time.  You will not be allowed to eat or drink until the throat numbness has resolved.  If this is a same-day procedure, you may be allowed to go home once you have been able to drink, urinate, and sit on the edge of the bed without nausea or dizziness.  If this is a same-day procedure, you should have a friend or family member with you for the next 24 hours after the procedure. This information is not intended to replace advice given to you by your health care provider. Make sure you discuss any questions you have with your health care provider. Document Released: 12/09/2005 Document Revised: 03/25/2016 Document Reviewed: 02/27/2014 Elsevier Interactive Patient Education  [employer].

## 2018-06-13 NOTE — Patient Instructions (Signed)
Stop taking amiodarone

## 2018-06-14 ENCOUNTER — Telehealth (HOSPITAL_COMMUNITY): Payer: Self-pay

## 2018-06-14 NOTE — Telephone Encounter (Signed)
Called to let patient know her MRI of brain was negative. Also that Dr. Walden Field wants her to see Rad onc to see if they recommend any other treatment. Patient requests to go to [facility]. She verbalized understanding and knows to expect call about radiation appt. Message sent to scheduling.

## 2018-06-15 ENCOUNTER — Ambulatory Visit (INDEPENDENT_AMBULATORY_CARE_PROVIDER_SITE_OTHER): Payer: Medicare Other | Admitting: Cardiology

## 2018-06-15 ENCOUNTER — Encounter: Payer: Self-pay | Admitting: Cardiology

## 2018-06-15 VITALS — BP 138/70 | HR 75 | Ht 66.0 in | Wt 163.8 lb

## 2018-06-15 DIAGNOSIS — E785 Hyperlipidemia, unspecified: Secondary | ICD-10-CM | POA: Diagnosis not present

## 2018-06-15 DIAGNOSIS — E1169 Type 2 diabetes mellitus with other specified complication: Secondary | ICD-10-CM | POA: Diagnosis not present

## 2018-06-15 DIAGNOSIS — I480 Paroxysmal atrial fibrillation: Secondary | ICD-10-CM | POA: Diagnosis not present

## 2018-06-15 DIAGNOSIS — Z720 Tobacco use: Secondary | ICD-10-CM

## 2018-06-15 DIAGNOSIS — I700 Atherosclerosis of aorta: Secondary | ICD-10-CM | POA: Diagnosis not present

## 2018-06-15 NOTE — Progress Notes (Signed)
Thoracic Location of Tumor / Histology:Malignant neoplasm of lung, unspecified laterality, unspecified part of lung left lower lobe  Patient presented months ago with symptoms of: 04-10-18 PET showed        hypermetabolic solid 1.4 cm left lower l         lobe pulmonary nodule with       max SUV 4.6 (series 3/image 118), new       since 03/03/2016 PET-CT and new               since 03/02/2017 chest CT. Complaints       of nausea.  IMPRESSION: 04-10-18 PET 1. Hypermetabolic solid 1.4 cm left lower lobe pulmonary nodule, new, suspicious for primary bronchogenic carcinoma given the advanced smoking related changes in the lungs. 2. No hypermetabolic thoracic adenopathy or distant metastatic disease. 3. Mild hypermetabolism in the antral region of the stomach without CT correlate, nonspecific, which could be due to peristalsis, inflammatory change or early recurrent tumor. 4. Chronic findings include: Aortic Atherosclerosis (ICD10-I70.0) and Emphysema (ICD10-J43.9). Marked sigmoid diverticulosis. Moderate to large hiatal hernia. Three-vessel coronary atherosclerosis.   Biopsies of  (if applicable) revealed: 24-58-09   FINAL DIAGNOSIS Diagnosis 05-19-18 1. 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 - THE SURGICAL RESECTION MARGINS ARE NEGATIVE FOR CARCINOMA. - SEE ONCOLOGY TABLE BELOW. 2. Lymph node, biopsy, Level 9 - THERE IS NO EVIDENCE OF CARCINOMA IN 1 OF 1 LYMPH NODE (0/1). 3. Lymph node, biopsy, Level 9 #2 - THERE IS NO EVIDENCE OF CARCINOMA IN 1 OF 1 LYMPH NODE (0/1). 4. Lymph node, biopsy, Level 11 - THERE IS NO EVIDENCE OF CARCINOMA IN 1 OF 1 LYMPH NODE (0/1). 5. Lymph node, biopsy, Level 11 #2 - THERE IS NO EVIDENCE OF CARCINOMA IN 1 OF 1 LYMPH NODE (0/1). 6. Lung, resection (segmental or lobe), Left lower lobe - NEUROENDOCRINE (CARCINOID) TUMORLET. - LUNG PARENCHYMA WITH INTERSTITIAL FIBROSIS,  ANTHRACOSIS, AND PIGMENT LADEN MACROPHAGES. - THERE IS NO EVIDENCE OF CARCINOMA IN 3 OF 3 LYMPH NODES (0/3). - SEE COMMENT. 7. Lymph node, biopsy, Level 7 - THERE IS NO EVIDENCE OF CARCINOMA IN 1 OF 1 LYMPH NODE (0/1). 8. Lymph node, biopsy, Level 11 #3 - THERE IS NO EVIDENCE OF CARCINOMA IN 1 OF 1 LYMPH NODE (0/1). 9. Lymph node, biopsy, Level 7 #2 - BENIGN FIBROADIPOSE TISSUE. - LYMPH NODAL TISSUE IS NOT IDENTIFIED. - THERE IS NO EVIDENCE OF MALIGNANCY. 10. Lymph node, biopsy, Level 7 #3 - THERE IS NO EVIDENCE OF CARCINOMA IN 1 OF 1 LYMPH NODE (0/1). 11. Lymph node, biopsy, 4L - THERE IS NO EVIDENCE OF CARCINOMA IN 1 OF 1 LYMPH NODE (0/1). 1 of 4 FINAL for Deluna, [place] E [phone]) Diagnosis(continued) 12. Lymph node, biopsy, Level 10 - THERE IS NO EVIDENCE OF CARCINOMA IN 1 OF 1 LYMPH NODE (0/1). 13. Lymph node, biopsy, Level 12 - THERE IS NO EVIDENCE OF CARCINOMA IN 1 OF 1 LYMPH NODE (0/1). 14. Lymph node, biopsy, Level 12 #2 - BENIGN FIBROADIPOSE TISSUE. - LYMPH NODAL TISSUE IS NOT IDENTIFIED. - THERE IS NO EVIDENCE OF MALIGNANCY. 15. Lymph node, biopsy, Level 12 #3 - THERE IS NO EVIDENCE OF CARCINOMA IN 1 OF 1 LYMPH NODE (0/1). 16. Lymph node, biopsy, Level 5 - THERE IS NO EVIDENCE OF CARCINOMA IN 1 OF 1 LYMPH NODE (0/1). 17. Lymph node, biopsy, Level 5 #2 - THERE IS NO EVIDENCE OF CARCINOMA IN 1 OF  1 LYMPH NODE (0/1). 18. Lymph node, biopsy, 12 #4 - THERE IS NO EVIDENCE OF CARCINOMA IN 1 OF 1 LYMPH NODE (0/1). Microscopic Comment 1. LUNG: Procedure: Lobectomy Specimen Laterality: Left lower Tumor Site: Left lower lobe   Tobacco/Marijuana/Snuff/ETOH use:  0.75 ppd, 42.75 pack-years 50 year smoker, No alcohol or smokeless tobacco usage Tobacco abuse-has not smoked in 4 weeks  Past/Anticipated interventions by cardiothoracic surgery, if any: Dr. Modesto Charon  06-13-18  Chest 2 View FINDINGS: Heart size and pulmonary vascularity are normal.  Complete  resolution of the small bilateral pleural effusions. Minimal residual atelectasis at the left lung base laterally.  Large chronic hiatal hernia.  No acute bone abnormality. Aortic atherosclerosis.  No pneumothorax.  IMPRESSION: 1. Interval complete resolution of the small bilateral pleural effusions. 2. Improved aeration at both lung bases with only minimal residual atelectasis at the left lung base. 3. Aortic Atherosclerosis (ICD10-I70.0).   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    Postoperatively she had atrial fibrillation and was started on amiodarone.  She was seen in consultation by Dr. Marlou Porch.  She has some incisional discomfort but is not taking anything for it.  Her primary complaint is nausea.  She thinks it may be due to an antibiotic that starts with the letter M.  She says that food does not taste good.  She saw Dr. Walden Field.  No adjuvant therapy was recommended.    CHEST - 2 VIEW  COMPARISON: 05/22/2018  FINDINGS: Heart size and pulmonary vascularity are normal.  Complete resolution of the small bilateral pleural effusions. Minimal residual atelectasis at the left lung base laterally.  Large chronic hiatal hernia.  No acute bone abnormality. Aortic atherosclerosis.  No pneumothorax.  IMPRESSION: 1. Interval complete resolution of the small bilateral pleural effusions. 2. Improved aeration at both lung bases with only minimal residual atelectasis at the left lung base. 3. Aortic Atherosclerosis (ICD10-I70.0).   Past/Anticipated interventions by medical oncology, if any: Dr. Zoila Shutter  FINDINGS: MRI Brain W WO contrast 06-12-18  Dr. Mathis Dad Higgs  Brain: Mild atrophy. Chronic microvascular ischemic change in the white matter. No acute infarct. Negative for hemorrhage or mass. Negative for metastatic disease. Normal enhancement  Vascular: Normal arterial flow  voids  Skull and upper cervical spine: Negative  Sinuses/Orbits: Mild mucosal edema paranasal sinuses. Bilateral cataract surgery.  Other: None  IMPRESSION: Negative for metastatic disease  Atrophy and chronic microvascular ischemia in the white matter and pons.   Electronically Signed By: Franchot Gallo M.D. On: 06/12/2018 13:09   Signs/Symptoms Weight changes, if any:  Wt Readings from Last 3 Encounters:  06/16/18 163 lb (73.9 kg)  06/15/18 163 lb 12.8 oz (74.3 kg)  06/13/18 165 lb (74.8 kg)     Respiratory complaints, if any:SOB with exertion, dry cough, denies wheezing  Hemoptysis, if any:   Pain issues, if any:    SAFETY ISSUES:  Prior radiation? :  Pacemaker/ICD?:  Possible current pregnancy?:No  Is the patient on methotrexate? No  Current Complaints / other details:   06-15-18 Dr. Ezra Sites  Paroxysmal atrial fibrillation, postoperative atrial fibrillation status post left lower lobe ectomy.  Amiodarone was stopped on 06/13/2018 after complaints of nausea.  She was in sinus rhythm at the time.  Excellent.  Overall her nausea is improved.  Zofran helped as well.  Awaiting echocardiogram 06-28-18 at 0820.  She is getting this done at any [facility].  Follow up in  1 year with Dr. Marlou Porch  BP (!) 145/66 (BP Location: Left Arm, Patient Position: Sitting)   Pulse 65   Temp 98.3 F (36.8 C) (Oral)   Resp 20   Ht 5\' 6"  (1.676 m)   Wt 163 lb (73.9 kg)   SpO2 96%   BMI 26.31 kg/m

## 2018-06-15 NOTE — Patient Instructions (Signed)

## 2018-06-15 NOTE — Progress Notes (Signed)
Radiation Oncology         ([phone]) [phone] ________________________________  Initial outpatient ReConsultation  Name: Monica [name] MRN: [id number]  Date of Service: 06/16/2018 DOB: [birth year]  UX:NATFTD, Silvestre Moment, MD  Higgs, Mathis Dad, MD   REFERRING PHYSICIAN: Higgs, Mathis Dad, MD  DIAGNOSIS: 77 y.o. 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 Nodule of left lung R91.1   2. Malignant neoplasm of lower lobe of left lung (HCC) C34.32     HISTORY OF PRESENT ILLNESS: Monica [name] is a 77 y.o. female seen at the request of Dr. Walden Field.  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  She had an excellent response to therapy and remained on observation only with surveillance imaging which was free of disease until a more recent PET CT scan on April 10, 2018 showed a new 1.4 cm left lower lobe mass with marketed hypermetabolic activity but without lymphadenopathy or distant metastases.  She underwent a VATS wedge resection with thoracoscopic left lower lobectomy and mediastinal lymph node dissection with Dr. Roxan Hockey on 05/19/2018.  Final pathology confirmed pT1aN0Mx, invasive squamous cell carcinoma consistent with lung primary as well as neuroendocrine (carcinoid) tumorlet within the sample.  Surgical margins were negative and there was no nodal involvement.  She has recovered well from the procedure and is currently without complaints other than mild residual fatigue.  She denies increased shortness of breath, chest pain, productive cough, hemoptysis, fever, chills, night sweats or unintended weight loss.  She has been referred for consult to discuss any potential role for adjuvant radiotherapy to the left lower lobe.  PREVIOUS RADIATION THERAPY: Yes  03/18/2016-04/08/2016:  The gastric tumor received 30 Gy in 15 fractions  of 2 Gy  PAST MEDICAL HISTORY:  Past Medical History:  Diagnosis Date  . Cancer (HCC)    NHL, Malt Lymphoma  . Depression   . 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 Dysphagia   . Dysrhythmia    History of palpatations  . GERD (gastroesophageal reflux disease)    INGESTION   . Heart palpitations   . Hiatal hernia   . Hyperlipidemia   . Interstitial cystitis   . 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 Sinusitis       PAST SURGICAL HISTORY: Past Surgical History:  Procedure Laterality Date  . ABDOMINAL HYSTERECTOMY    . BIOPSY  09/01/2016   Procedure: BIOPSY;  Surgeon: Daneil Dolin, MD;  [facility];  Service: Endoscopy;;  gastric  . BLADDER SURGERY    . CATARACT EXTRACTION, BILATERAL    . COLONOSCOPY     Dr. Lindalou Hose 2009: Normal per PCP notes  . ESOPHAGOGASTRODUODENOSCOPY     RMR: Prominant Schzgzki ring/component of peptic stricture status post dilation and disruption as described above, otherwise norma esophagus, moderate-sized hiatal hernia, antal pyloric channel, and posterier bulbar erosions, otherwise unremarkable stomach, D1 and D2 . Inflammatory findings on the stomach and duodenum will likely be related to aspirin effect. We need to rule out Helicobacter pylor  . ESOPHAGOGASTRODUODENOSCOPY N/A 03/10/2015   Dr. 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 Pathology with lymphoid population of stomach, slight atypia  . ESOPHAGOGASTRODUODENOSCOPY N/A 02/03/2016   Dr. Gala Romney: Schatzki ring noted at  GE junction, dilated with 56 and then 58 [place] Maloney dilator. Large hiatal hernia. 6 x 7 cm nodular geographically ulcerated mucosa, biopsy c/w MALToma  . ESOPHAGOGASTRODUODENOSCOPY N/A 04/08/2016   Dr. Gala Romney: moderate Schatzki's ring/web s/p dilation, large hiatal hernia, localized area of gastric lymphoma visualized and appeared to be much improved. normal second portion of the  duodenum. No specimens collected. Esophageal lumen notably tighted up significantly since her dilation in April of this year.   . ESOPHAGOGASTRODUODENOSCOPY N/A 08/04/2016   Procedure: ESOPHAGOGASTRODUODENOSCOPY (EGD);  Surgeon: Daneil Dolin, MD;  [facility];  Service: Endoscopy;  Laterality: N/A;  7:30 am  . ESOPHAGOGASTRODUODENOSCOPY N/A 09/01/2016   Procedure: ESOPHAGOGASTRODUODENOSCOPY (EGD);  Surgeon: Daneil Dolin, MD;  [facility];  Service: Endoscopy;  Laterality: N/A;  830   . ESOPHAGOGASTRODUODENOSCOPY N/A 05/10/2017   Dr. Gala Romney: Moderate Schatzki ring at the GE junction, status post dilation with 66 [place].  Medium sized hiatal hernia.  Few localized erosions in the gastric antrum.  Stomach biopsy showed chronic gastritis, no H. pylori.  No atypical lymphoid infiltrates or other features of lymphoproliferative process  . ESOPHAGOGASTRODUODENOSCOPY N/A 06/13/2018   Procedure: ESOPHAGOGASTRODUODENOSCOPY (EGD);  Surgeon: Daneil Dolin, MD;  [facility];  Service: Endoscopy;  Laterality: N/A;  7:30am  . LOBECTOMY Left 05/19/2018   Procedure: LEFT LOWER LOBECTOMY;  Surgeon: [name], MD;  Location: [facility];  Service: Thoracic;  Laterality: Left;  Marland Kitchen MALONEY DILATION N/A 03/10/2015   Procedure: Venia Minks DILATION;  Surgeon: Daneil Dolin, MD;  [facility];  Service: Endoscopy;  Laterality: N/A;  . Venia Minks DILATION N/A 02/03/2016   Procedure: Venia Minks DILATION;  Surgeon: Daneil Dolin, MD;  [facility];  Service: Endoscopy;  Laterality: N/A;  . Venia Minks DILATION N/A 04/08/2016   Procedure: Venia Minks DILATION;  Surgeon: Daneil Dolin, MD;  [facility];  Service: Endoscopy;  Laterality: N/A;  . Venia Minks DILATION N/A 05/10/2017   Procedure: Venia Minks DILATION;  Surgeon: Daneil Dolin, MD;  [facility];  Service: Endoscopy;  Laterality: N/A;  . Venia Minks DILATION N/A 06/13/2018   Procedure: Venia Minks DILATION;   Surgeon: Daneil Dolin, MD;  [facility];  Service: Endoscopy;  Laterality: N/A;  . VIDEO ASSISTED THORACOSCOPY (VATS)/WEDGE RESECTION Left 05/19/2018   Procedure: VIDEO ASSISTED THORACOSCOPY (VATS)/WEDGE RESECTION;  Surgeon: [name], MD;  Location: [facility];  Service: Thoracic;  Laterality: Left;    FAMILY HISTORY:  Family History  Problem Relation Age of Onset  . Heart attack Father   . Dementia Father   . Diabetes Sister   . Diabetes Brother   . Diabetes Sister   . Colon cancer Neg Hx     SOCIAL HISTORY:  Social History   Socioeconomic History  . Marital status: Married    Spouse name: Not on file  . Number of children: Not on file  . Years of education: Not on file  . Highest education level: Not on file  Occupational History  . Not on file  Social Needs  . Financial resource strain: Not on file  . Food insecurity:    Worry: Not on file    Inability: Not on file  . Transportation needs:    Medical: Not on file    Non-medical: Not on file  Tobacco Use  . Smoking status: Former Smoker    Packs/day: 0.75    Years: 57.00    Pack years: 42.75  Types: Cigarettes    Last attempt to quit: 05/23/2018    Years since quitting: 0.0  . Smokeless tobacco: Never Used  . Tobacco comment: trying to quit, wearing patch  Substance and Sexual Activity  . Alcohol use: No    Alcohol/week: 0.0 standard drinks  . Drug use: No  . Sexual activity: Not on file  Lifestyle  . Physical activity:    Days per week: Not on file    Minutes per session: Not on file  . Stress: Not on file  Relationships  . Social connections:    Talks on phone: Not on file    Gets together: Not on file    Attends religious service: Not on file    Active member of club or organization: Not on file    Attends meetings of clubs or organizations: Not on file    Relationship status: Not on file  . Intimate partner violence:    Fear of current or ex partner: Not on file     Emotionally abused: Not on file    Physically abused: Not on file    Forced sexual activity: Not on file  Other Topics Concern  . Not on file  Social History Narrative  . Not on file    ALLERGIES: Sulfur  MEDICATIONS:  Current Outpatient Medications  Medication Sig Dispense Refill  . acetaminophen (TYLENOL) 500 MG tablet Take 2 tablets (1,000 mg total) by mouth every 6 (six) hours. 30 tablet 0  . Calcium-Magnesium-Vitamin D (CALCIUM 500 PO) Take 500 mg by mouth every other day.     . citalopram (CELEXA) 20 MG tablet Take 20 mg by mouth daily.     Marland Kitchen doxycycline (VIBRAMYCIN) 100 MG capsule TAKE 1 CAPSULE BY MOUTH EVERY OTHER DAY BEFORE BED AND TAKE CALCIUM CITRATE ON OPPOSITE DAYS  5  . metFORMIN (GLUCOPHAGE-XR) 500 MG 24 hr tablet Take 500 mg 2 (two) times daily by mouth.     . metoprolol tartrate (LOPRESSOR) 25 MG tablet Take 25 mg by mouth every evening.    . Misc. Devices MISC Please provide patient with lightweight transport wheelchair. 1 each 0  . Multiple Vitamin (MULTIVITAMIN WITH MINERALS) TABS tablet Take 1 tablet by mouth daily.    . ondansetron (ZOFRAN) 4 MG tablet Take 1 tablet (4 mg total) by mouth every 8 (eight) hours as needed for nausea or vomiting. 20 tablet 0  . oxyCODONE (OXY IR/ROXICODONE) 5 MG immediate release tablet Take 1 tablet (5 mg total) by mouth every 6 (six) hours as needed for severe pain. 20 tablet 0  . pantoprazole (PROTONIX) 40 MG tablet Take 1 tablet (40 mg total) by mouth daily before breakfast. 90 tablet 3  . pravastatin (PRAVACHOL) 40 MG tablet Take 40 mg by mouth at bedtime.      No current facility-administered medications for this encounter.     REVIEW OF SYSTEMS:  On review of systems, the patient reports that she is doing well overall. She denies any chest pain, shortness of breath, cough, fevers, chills, night sweats, unintended weight changes. She denies any bowel or bladder disturbances, and denies abdominal pain, nausea or vomiting. She  denies any new musculoskeletal or joint aches or pains. A complete review of systems is obtained and is otherwise negative.  PHYSICAL EXAM:  Wt Readings from Last 3 Encounters:  06/16/18 163 lb (73.9 kg)  06/15/18 163 lb 12.8 oz (74.3 kg)  06/13/18 165 lb (74.8 kg)   Temp Readings from Last 3 Encounters:  06/16/18 98.3 F (36.8 C) (Oral)  06/13/18 (!) 95 F (35 C) (Oral)  06/05/18 98 F (36.7 C) (Oral)   BP Readings from Last 3 Encounters:  06/16/18 (!) 145/66  06/15/18 138/70  06/13/18 104/60   Pulse Readings from Last 3 Encounters:  06/16/18 65  06/15/18 75  06/13/18 88   Pain Assessment Pain Score: 0-No pain/10  In general this is a well appearing Caucasian female in no acute distress.  She is alert and oriented x4 and appropriate throughout the examination. HEENT reveals that the patient is normocephalic, atraumatic. EOMs are intact. PERRLA. Skin is intact without any evidence of gross lesions. Cardiovascular exam reveals a regular rate and rhythm, no clicks rubs or murmurs are auscultated. Chest is clear to auscultation bilaterally. Lymphatic assessment is performed and does not reveal any adenopathy in the cervical, supraclavicular, axillary, or inguinal chains. Abdomen has active bowel sounds in all quadrants and is intact. The abdomen is soft, non tender, non distended. Lower extremities are negative for pretibial pitting edema, deep calf tenderness, cyanosis or clubbing.   KPS = 90  100 - Normal; no complaints; no evidence of disease. 90   - Able to carry on normal activity; minor signs or symptoms of disease. 80   - Normal activity with effort; some signs or symptoms of disease. 20   - Cares for self; unable to carry on normal activity or to do active work. 60   - Requires occasional assistance, but is able to care for most of his personal needs. 50   - Requires considerable assistance and frequent medical care. 54   - Disabled; requires special care and  assistance. 35   - Severely disabled; hospital admission is indicated although death not imminent. 68   - Very sick; hospital admission necessary; active supportive treatment necessary. 10   - Moribund; fatal processes progressing rapidly. 0     - Dead  Karnofsky DA, Abelmann [place], Craver LS and Burchenal JH [phone]) The use of the nitrogen mustards in the palliative treatment of carcinoma: with particular reference to bronchogenic carcinoma Cancer 1 634-56  LABORATORY DATA:  Lab Results  Component Value Date   WBC 8.0 05/21/2018   HGB 10.3 (L) 05/21/2018   HCT 32.8 (L) 05/21/2018   MCV 91.1 05/21/2018   PLT 202 05/21/2018   Lab Results  Component Value Date   NA 139 05/21/2018   K 4.5 05/21/2018   CL 103 05/21/2018   CO2 29 05/21/2018   Lab Results  Component Value Date   ALT 17 05/21/2018   AST 23 05/21/2018   ALKPHOS 44 05/21/2018   BILITOT 0.6 05/21/2018     RADIOGRAPHY: Dg Chest 2 View  Result Date: 06/13/2018 CLINICAL DATA:  Left lower lobectomy on 05/19/2018 for lung cancer. EXAM: CHEST - 2 VIEW COMPARISON:  05/22/2018 FINDINGS: Heart size and pulmonary vascularity are normal. Complete resolution of the small bilateral pleural effusions. Minimal residual atelectasis at the left lung base laterally. Large chronic hiatal hernia. No acute bone abnormality.  Aortic atherosclerosis. No pneumothorax. IMPRESSION: 1. Interval complete resolution of the small bilateral pleural effusions. 2. Improved aeration at both lung bases with only minimal residual atelectasis at the left lung base. 3.  Aortic Atherosclerosis (ICD10-I70.0). Electronically Signed   By: Lorriane Shire M.D.   On: 06/13/2018 11:24   Dg Chest 2 View  Result Date: 05/22/2018 CLINICAL DATA:  Follow-up chest tube removal EXAM: CHEST - 2 VIEW COMPARISON:  05/21/2018 FINDINGS: Cardiac shadow is  stable. Aortic calcifications are again seen. Left subclavian central line is again noted and stable. Some mild subcutaneous  emphysema is noted on the left related to the prior chest tube. No definitive recurrent pneumothorax is noted following chest tube removal. Mild bibasilar atelectatic changes are seen. No acute bony abnormality is noted. IMPRESSION: Mild bibasilar atelectasis relatively stable from the prior exam. No significant recurrent pneumothorax is noted at this time. Electronically Signed   By: Inez Catalina M.D.   On: 05/22/2018 07:49   Mr Jeri Cos ZO Contrast  Result Date: 06/12/2018 CLINICAL DATA:  Non-small-cell lung cancer staging EXAM: MRI HEAD WITHOUT AND WITH CONTRAST TECHNIQUE: Multiplanar, multiecho pulse sequences of the brain and surrounding structures were obtained without and with intravenous contrast. CONTRAST:  65mL MULTIHANCE GADOBENATE DIMEGLUMINE 529 MG/ML IV SOLN COMPARISON:  None. FINDINGS: Brain: Mild atrophy. Chronic microvascular ischemic change in the white matter. No acute infarct. Negative for hemorrhage or mass. Negative for metastatic disease. Normal enhancement Vascular: Normal arterial flow voids Skull and upper cervical spine: Negative Sinuses/Orbits: Mild mucosal edema paranasal sinuses. Bilateral cataract surgery. Other: None IMPRESSION: Negative for metastatic disease Atrophy and chronic microvascular ischemia in the white matter and pons. Electronically Signed   By: Franchot Gallo M.D.   On: 06/12/2018 13:09   Dg Chest Port 1 View  Result Date: 05/21/2018 CLINICAL DATA:  Two days post left VATS. EXAM: PORTABLE CHEST 1 VIEW COMPARISON:  05/21/2018 FINDINGS: Left subclavian Port-A-Cath unchanged. Interval removal of left-sided chest tube. Lungs are adequately inflated with mild prominence of the perihilar vasculature. Persistent bibasilar opacification likely small effusions with atelectasis. Previously seen tiny left apical pneumothorax not visualized. Mild stable cardiomegaly. Remainder of the exam is unchanged. IMPRESSION: Left chest tube removed with no residual left apical  pneumothorax identified. Persistent bibasilar opacification without significant change likely small effusions with atelectasis. Evidence of mild vascular congestion and mild stable cardiomegaly. Electronically Signed   By: Marin Olp M.D.   On: 05/21/2018 15:01   Dg Chest Port 1 View  Result Date: 05/21/2018 CLINICAL DATA:  Status post left lung lobectomy. EXAM: PORTABLE CHEST 1 VIEW COMPARISON:  Yesterday. FINDINGS: A left chest tube remains in place. There has been no significant change in a tiny residual left apical pneumothorax. Left subclavian catheter tip in the region of the confluence of the innominate veins. No significant change in mild left lateral subcutaneous emphysema. Stable mildly enlarged cardiac silhouette. Increased patchy opacity at the left lung base with interval patchy opacity at the right lung base. The interstitial markings remain prominent. Diffuse osteopenia. IMPRESSION: 1. Stable tiny left apical pneumothorax with a left chest tube in place. 2. Increased patchy atelectasis or pneumonia at the left lung base. 3. Interval patchy atelectasis or pneumonia at the right lung base. 4. Stable cardiomegaly and chronic interstitial lung disease with possible mild superimposed interstitial pulmonary edema. Electronically Signed   By: Claudie Revering M.D.   On: 05/21/2018 08:51   Dg Chest Port 1 View  Result Date: 05/20/2018 CLINICAL DATA:  Left lower lobe lobectomy. EXAM: PORTABLE CHEST 1 VIEW COMPARISON:  05/19/2018 FINDINGS: The left-sided chest tube is stable. Suspect tiny residual apical pneumothorax. The left subclavian catheter is stable. Improved lung aeration with resolving interstitial edema. There is a persistent small left effusion and left basilar atelectasis. IMPRESSION: Postoperative support apparatus in good position without complicating features. Tiny residual apical pneumothorax. Improved lung aeration with resolving edema and atelectasis. Persistent small left effusion and  left basilar atelectasis. Electronically  Signed   By: Marijo Sanes M.D.   On: 05/20/2018 08:34   Dg Chest Port 1 View  Result Date: 05/19/2018 CLINICAL DATA:  Prior lung surgery. EXAM: PORTABLE CHEST 1 VIEW COMPARISON:  05/16/2018. FINDINGS: Left subclavian line noted with tip over superior vena cava. Left chest tube noted with tip over the left upper chest. Tiny left apical and possible left base pneumothorax cannot be excluded. Cardiomegaly. No prominent pulmonary venous congestion. Postsurgical changes left lung. Bibasilar atelectasis. Bilateral interstitial prominence. A component these changes may be chronic. Active interstitial process including CHF and/or pneumonitis cannot be excluded. IMPRESSION: 1. Left subclavian line noted with tip over superior vena cava. Left chest tube noted with tip over left upper chest. Tiny left apical and possible left base pneumothorax cannot be excluded. 2.  Postsurgical changes left lung.  Bibasilar atelectasis. 3. Cardiomegaly. Mild bilateral from interstitial prominence. Component interstitial changes may be chronic. Active interstitial process including mild interstitial edema and/or pneumonitis cannot be excluded. Critical Value/emergent results were called by telephone at the time of interpretation on 05/19/2018 at 12:41 pm to nurse Loree Fee, who verbally acknowledged these results. Electronically Signed   By: Marcello Moores  Register   On: 05/19/2018 12:45      IMPRESSION/PLAN: 45. 76 y.o. 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 Dr. Tammi Klippel and I personally reviewed her recent imaging and pathology report.  Today, we talked to the patient and family about the findings and workup thus far. We discussed the natural history of Stage IA NSCLC carcinoma and general treatment, highlighting the role of radiotherapy in the  management.  At this point, there does not appear to be a role for post-surgical adjuvant radiotherapy given findings on her pathology report.  The patient was encouraged to ask questions that were answered to her satisfaction. This was a very interesting case in that there was the additional finding of a neuroendocrine (carcinoid) tumorlet component in the tissue sample but this appears to have been completely resected along with the NSCLC.  Therefore, we are in agreement that the patient can safely proceed in observation with serial repeat CT chest scans to monitor for any evidence of disease recurrence in the future.  We appreciate the opportunity to weigh in on and participate in the decision making process for this interesting case and would be happy to dissipate in her care further if there is ever a clinical indication in the future.      Nicholos Johns, PA-C    Tyler Pita, MD  [facility] Direct Dial: [phone]  Fax: [phone] [email or web address]  Skype  LinkedIn

## 2018-06-15 NOTE — Progress Notes (Signed)
Cardiology Office Note:    Date:  06/15/2018   ID:  Monica [name], DOB [birth year], MRN [id number]  PCP:  Manon Hilding, MD  Cardiologist:  No primary care provider on file.   Referring MD: Manon Hilding, MD     History of Present Illness:    Monica [name] is a 77 y.o. female who underwent left lower lobe resection on 05/19/2018 for a stage Ia squamous cell carcinoma who had atrial fibrillation postoperatively and was started on amiodarone at that time seen in consultation by me in the hospital setting here for follow-up.  I have reviewed Dr. Leonarda Salon last note from 06/13/2018 and she was doing fairly well except for complaint of nausea.  She says food does not taste well.  She thought it was may be due to an antibiotic.  Because of this complaint, Dr. Roxan Hockey stop the amiodarone as the potential cause for her nausea.  She was in sinus rhythm about 1 month out of surgery.  She has a history of prior tobacco use, gastric MALT lymphoma, type 2 diabetes, Schatzki's ring, hiatal hernia, interstitial cystitis, depression.  She denies any anginal-like symptoms, she is having some shortness of breath which is typical after surgery, no further nausea, no syncope, no bleeding.  Chest wall site is uncomfortable.  To be expected.  Past Medical History:  Diagnosis Date  . Cancer (HCC)    NHL, Malt Lymphoma  . Depression   . DM (diabetes mellitus) ([place])    TYPE  2  . Dysphagia   . Dysrhythmia    History of palpatations  . GERD (gastroesophageal reflux disease)    INGESTION   . Heart palpitations   . Hiatal hernia   . Hyperlipidemia   . Interstitial cystitis   . 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 Sinusitis     Past Surgical History:  Procedure Laterality Date  . ABDOMINAL HYSTERECTOMY    . BIOPSY  09/01/2016   Procedure: BIOPSY;  Surgeon: Daneil Dolin, MD;  [facility];  Service: Endoscopy;;  gastric  . BLADDER SURGERY    . CATARACT  EXTRACTION, BILATERAL    . COLONOSCOPY     Dr. Lindalou Hose 2009: Normal per PCP notes  . ESOPHAGOGASTRODUODENOSCOPY     RMR: Prominant Schzgzki ring/component of peptic stricture status post dilation and disruption as described above, otherwise norma esophagus, moderate-sized hiatal hernia, antal pyloric channel, and posterier bulbar erosions, otherwise unremarkable stomach, D1 and D2 . Inflammatory findings on the stomach and duodenum will likely be related to aspirin effect. We need to rule out Helicobacter pylor  . ESOPHAGOGASTRODUODENOSCOPY N/A 03/10/2015   Dr. 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 Pathology with lymphoid population of stomach, slight atypia  . ESOPHAGOGASTRODUODENOSCOPY N/A 02/03/2016   Dr. Gala Romney: Schatzki ring noted at GE junction, dilated with 32 and then 69 [place] dilator. Large hiatal hernia. 6 x 7 cm nodular geographically ulcerated mucosa, biopsy c/w MALToma  . ESOPHAGOGASTRODUODENOSCOPY N/A 04/08/2016   Dr. Gala Romney: moderate Schatzki's ring/web s/p dilation, large hiatal hernia, localized area of gastric lymphoma visualized and appeared to be much improved. normal second portion of the duodenum. No specimens collected. Esophageal lumen notably tighted up significantly since her dilation in April of this year.   . ESOPHAGOGASTRODUODENOSCOPY N/A 08/04/2016   Procedure: ESOPHAGOGASTRODUODENOSCOPY (EGD);  Surgeon: Daneil Dolin, MD;  [facility];  Service: Endoscopy;  Laterality:  N/A;  7:30 am  . ESOPHAGOGASTRODUODENOSCOPY N/A 09/01/2016   Procedure: ESOPHAGOGASTRODUODENOSCOPY (EGD);  Surgeon: Daneil Dolin, MD;  [facility];  Service: Endoscopy;  Laterality: N/A;  830   . ESOPHAGOGASTRODUODENOSCOPY N/A 05/10/2017   Dr. Gala Romney: Moderate Schatzki ring at the GE junction, status post dilation with 27 [place].  Medium sized hiatal hernia.  Few localized erosions in the gastric antrum.   Stomach biopsy showed chronic gastritis, no H. pylori.  No atypical lymphoid infiltrates or other features of lymphoproliferative process  . LOBECTOMY Left 05/19/2018   Procedure: LEFT LOWER LOBECTOMY;  Surgeon: [name], MD;  Location: [place];  Service: Thoracic;  Laterality: Left;  Marland Kitchen MALONEY DILATION N/A 03/10/2015   Procedure: Venia Minks DILATION;  Surgeon: Daneil Dolin, MD;  [facility];  Service: Endoscopy;  Laterality: N/A;  . Venia Minks DILATION N/A 02/03/2016   Procedure: Venia Minks DILATION;  Surgeon: Daneil Dolin, MD;  [facility];  Service: Endoscopy;  Laterality: N/A;  . Venia Minks DILATION N/A 04/08/2016   Procedure: Venia Minks DILATION;  Surgeon: Daneil Dolin, MD;  [facility];  Service: Endoscopy;  Laterality: N/A;  . Venia Minks DILATION N/A 05/10/2017   Procedure: Venia Minks DILATION;  Surgeon: Daneil Dolin, MD;  [facility];  Service: Endoscopy;  Laterality: N/A;  . VIDEO ASSISTED THORACOSCOPY (VATS)/WEDGE RESECTION Left 05/19/2018   Procedure: VIDEO ASSISTED THORACOSCOPY (VATS)/WEDGE RESECTION;  Surgeon: [name], MD;  Location: [facility];  Service: Thoracic;  Laterality: Left;    Current Medications: Current Meds  Medication Sig  . acetaminophen (TYLENOL) 500 MG tablet Take 2 tablets (1,000 mg total) by mouth every 6 (six) hours.  . Calcium-Magnesium-Vitamin D (CALCIUM 500 PO) Take 500 mg by mouth every other day.   . citalopram (CELEXA) 20 MG tablet Take 20 mg by mouth daily.   Marland Kitchen doxycycline (VIBRAMYCIN) 100 MG capsule TAKE 1 CAPSULE BY MOUTH EVERY OTHER DAY BEFORE BED AND TAKE CALCIUM CITRATE ON OPPOSITE DAYS  . metFORMIN (GLUCOPHAGE-XR) 500 MG 24 hr tablet Take 500 mg 2 (two) times daily by mouth.   . metoprolol tartrate (LOPRESSOR) 25 MG tablet Take 25 mg by mouth every evening.  . Misc. Devices MISC Please provide patient with lightweight transport wheelchair.  . Multiple Vitamin (MULTIVITAMIN WITH MINERALS) TABS tablet  Take 1 tablet by mouth daily.  . ondansetron (ZOFRAN) 4 MG tablet Take 1 tablet (4 mg total) by mouth every 8 (eight) hours as needed for nausea or vomiting.  Marland Kitchen oxyCODONE (OXY IR/ROXICODONE) 5 MG immediate release tablet Take 1 tablet (5 mg total) by mouth every 6 (six) hours as needed for severe pain.  . pantoprazole (PROTONIX) 40 MG tablet Take 1 tablet (40 mg total) by mouth daily before breakfast.  . pravastatin (PRAVACHOL) 40 MG tablet Take 40 mg by mouth at bedtime.      Allergies:   Sulfur   Social History   Socioeconomic History  . Marital status: Married    Spouse name: Not on file  . Number of children: Not on file  . Years of education: Not on file  . Highest education level: Not on file  Occupational History  . Not on file  Social Needs  . Financial resource strain: Not on file  . Food insecurity:    Worry: Not on file    Inability: Not on file  . Transportation needs:    Medical: Not on file    Non-medical: Not on file  Tobacco Use  . Smoking status: Former Smoker    Packs/day: 0.75    Years: 57.00    Pack years: 42.75    Types: Cigarettes    Last attempt to quit: 05/23/2018    Years since quitting: 0.0  . Smokeless tobacco: Never Used  . Tobacco comment: trying to quit, wearing patch  Substance and Sexual Activity  . Alcohol use: No    Alcohol/week: 0.0 standard drinks  . Drug use: No  . Sexual activity: Not on file  Lifestyle  . Physical activity:    Days per week: Not on file    Minutes per session: Not on file  . Stress: Not on file  Relationships  . Social connections:    Talks on phone: Not on file    Gets together: Not on file    Attends religious service: Not on file    Active member of club or organization: Not on file    Attends meetings of clubs or organizations: Not on file    Relationship status: Not on file  Other Topics Concern  . Not on file  Social History Narrative  . Not on file     Family History: The patient's family  history includes Dementia in her father; Diabetes in her brother, sister, and sister; Heart attack in her father. There is no history of Colon cancer.  ROS:   Please see the history of present illness.     All other systems reviewed and are negative.  EKGs/Labs/Other Studies Reviewed:    The following studies were reviewed today: CT scan-aortic atherosclerosis.  Prior office notes, lab work  EKG: 05/21/2018-sinus rhythm right bundle branch block personally viewed.  Recent Labs: 05/21/2018: ALT 17; BUN 8; Creatinine, Ser 0.70; Hemoglobin 10.3; Platelets 202; Potassium 4.5; Sodium 139  Recent Lipid Panel No results found for: CHOL, TRIG, HDL, CHOLHDL, VLDL, LDLCALC, LDLDIRECT  Physical Exam:    VS:  BP 138/70   Pulse 75   Ht 5\' 6"  (1.676 m)   Wt 163 lb 12.8 oz (74.3 kg)   SpO2 92%   BMI 26.44 kg/m     Wt Readings from Last 3 Encounters:  06/15/18 163 lb 12.8 oz (74.3 kg)  06/13/18 165 lb (74.8 kg)  06/13/18 159 lb (72.1 kg)     GEN:  Well nourished, well developed in no acute distress HEENT: Normal NECK: No JVD; No carotid bruits LYMPHATICS: No lymphadenopathy CARDIAC: RRR, no murmurs, rubs, gallops RESPIRATORY:  Clear to auscultation without rales, wheezing or rhonchi  ABDOMEN: Soft, non-tender, non-distended MUSCULOSKELETAL:  No edema; No deformity  SKIN: Warm and dry NEUROLOGIC:  Alert and oriented x 3 PSYCHIATRIC:  Normal affect   ASSESSMENT:    1. Paroxysmal atrial fibrillation (HCC)   2. Type 2 diabetes mellitus with hyperlipidemia ([place])   3. Aortic atherosclerosis ([place])   4. Tobacco use    PLAN:    In order of problems listed above:  Paroxysmal atrial fibrillation, postoperative atrial fibrillation status post left lower lobe ectomy - Amiodarone was stopped on 06/13/2018 after complaints of nausea.  She was in sinus rhythm at the time.  Excellent.  Overall her nausea is improved.  Zofran helped as well.  Awaiting echocardiogram.  She is getting this done at  [facility].  Squamous cell carcinoma - Resection on 05/19/2018  Tobacco use - Has been doing well with cessation.  Excellent.  Aortic atherosclerosis -Seen on prior imaging.  Continue with secondary prevention.  On pravastatin.  Diabetes -  Hemoglobin A1c 7.4 on 05/17/2018.  Continue to work with primary physician for good overall glucose control.  On metformin.    We will see back in 1 year.  Medication Adjustments/Labs and Tests Ordered: Current medicines are reviewed at length with the patient today.  Concerns regarding medicines are outlined above.  No orders of the defined types were placed in this encounter.  No orders of the defined types were placed in this encounter.   Patient Instructions  Medication Instructions:  The current medical regimen is effective;  continue present plan and medications.  Follow-Up: Follow up in 1 year with Dr. Marlou Porch.  You will receive a letter in the mail 2 months before you are due.  Please call us when you receive this letter to schedule your follow up appointment.  If you need a refill on your cardiac medications before your next appointment, please call your pharmacy.  Thank you for choosing [facility]!!        Signed, Candee Furbish, MD  06/15/2018 8:49 AM    [place]

## 2018-06-16 ENCOUNTER — Ambulatory Visit
Admission: RE | Admit: 2018-06-16 | Discharge: 2018-06-16 | Disposition: A | Discharge status: Home / Self Care | Payer: Medicare Other | Source: Ambulatory Visit | Attending: Urology | Admitting: Urology

## 2018-06-16 ENCOUNTER — Encounter (HOSPITAL_COMMUNITY): Payer: Self-pay | Admitting: Internal Medicine

## 2018-06-16 ENCOUNTER — Other Ambulatory Visit: Payer: Self-pay

## 2018-06-16 ENCOUNTER — Ambulatory Visit
Admission: RE | Admit: 2018-06-16 | Discharge: 2018-06-16 | Disposition: A | Discharge status: Home / Self Care | Payer: Medicare Other | Source: Ambulatory Visit | Attending: Radiation Oncology | Admitting: Radiation Oncology

## 2018-06-16 VITALS — BP 145/66 | HR 65 | Temp 98.3°F | Resp 20 | Ht 66.0 in | Wt 163.0 lb

## 2018-06-16 DIAGNOSIS — Z87891 Personal history of nicotine dependence: Secondary | ICD-10-CM | POA: Diagnosis not present

## 2018-06-16 DIAGNOSIS — R911 Solitary pulmonary nodule: Secondary | ICD-10-CM

## 2018-06-16 DIAGNOSIS — C884 Extranodal marginal zone B-cell lymphoma of mucosa-associated lymphoid tissue [MALT-lymphoma]: Secondary | ICD-10-CM | POA: Diagnosis not present

## 2018-06-16 DIAGNOSIS — Z923 Personal history of irradiation: Secondary | ICD-10-CM | POA: Insufficient documentation

## 2018-06-16 DIAGNOSIS — Z902 Acquired absence of lung [part of]: Secondary | ICD-10-CM | POA: Diagnosis not present

## 2018-06-16 DIAGNOSIS — Z79899 Other long term (current) drug therapy: Secondary | ICD-10-CM | POA: Insufficient documentation

## 2018-06-16 DIAGNOSIS — Z7984 Long term (current) use of oral hypoglycemic drugs: Secondary | ICD-10-CM | POA: Diagnosis not present

## 2018-06-16 DIAGNOSIS — C3432 Malignant neoplasm of lower lobe, left bronchus or lung: Secondary | ICD-10-CM | POA: Insufficient documentation

## 2018-06-16 DIAGNOSIS — E119 Type 2 diabetes mellitus without complications: Secondary | ICD-10-CM | POA: Insufficient documentation

## 2018-06-18 ENCOUNTER — Encounter: Payer: Self-pay | Admitting: Internal Medicine

## 2018-06-19 ENCOUNTER — Other Ambulatory Visit: Payer: Self-pay | Admitting: *Deleted

## 2018-06-19 ENCOUNTER — Telehealth: Payer: Self-pay | Admitting: *Deleted

## 2018-06-19 DIAGNOSIS — R110 Nausea: Secondary | ICD-10-CM

## 2018-06-19 MED ORDER — PROMETHAZINE HCL 12.5 MG PO TABS: 6.2500 mg | ORAL_TABLET | Freq: Four times a day (QID) | ORAL | 0 refills | Status: DC | PRN

## 2018-06-19 NOTE — Telephone Encounter (Signed)
Monica [name] was here last week for a post op visit s/p LLLobectomy 05/19/18.  She complained of sever nausea and Dr. Roxan Hockey d/c'd the Amiodarone as he felt this was the culprit. She called today relating that the nausea was still very bad and was requesting "something stronger." I consulted with Dr. Roxan Hockey, he ordered a low dose Phenergan, but also recommended that if it didn't resolve she should see her PCP for a workup. She agreed.

## 2018-06-28 ENCOUNTER — Ambulatory Visit (HOSPITAL_COMMUNITY)
Admission: RE | Admit: 2018-06-28 | Discharge: 2018-06-28 | Disposition: A | Discharge status: Home / Self Care | Payer: Medicare Other | Source: Ambulatory Visit | Attending: Cardiology | Admitting: Cardiology

## 2018-06-28 DIAGNOSIS — E119 Type 2 diabetes mellitus without complications: Secondary | ICD-10-CM | POA: Diagnosis not present

## 2018-06-28 DIAGNOSIS — I340 Nonrheumatic mitral (valve) insufficiency: Secondary | ICD-10-CM | POA: Diagnosis not present

## 2018-06-28 DIAGNOSIS — E785 Hyperlipidemia, unspecified: Secondary | ICD-10-CM | POA: Diagnosis not present

## 2018-06-28 DIAGNOSIS — I480 Paroxysmal atrial fibrillation: Secondary | ICD-10-CM | POA: Diagnosis not present

## 2018-06-28 DIAGNOSIS — Z85118 Personal history of other malignant neoplasm of bronchus and lung: Secondary | ICD-10-CM | POA: Insufficient documentation

## 2018-06-28 DIAGNOSIS — R000 Tachycardia, unspecified: Secondary | ICD-10-CM | POA: Diagnosis not present

## 2018-06-28 NOTE — Progress Notes (Signed)
*  PRELIMINARY RESULTS* Echocardiogram 2D Echocardiogram has been performed.  Leavy Cella 06/28/2018, 9:08 AM

## 2018-06-29 DIAGNOSIS — N3020 Other chronic cystitis without hematuria: Secondary | ICD-10-CM | POA: Diagnosis not present

## 2018-06-30 ENCOUNTER — Telehealth: Payer: Self-pay

## 2018-06-30 NOTE — Telephone Encounter (Signed)
-----   Message from Cheryln Manly, NP sent at 06/29/2018  2:33 PM EDT ----- Please inform the patient her heart function overall is normal. Please keep her scheduled follow up appts.

## 2018-06-30 NOTE — Telephone Encounter (Signed)
Notes recorded by Frederik Schmidt, RN on 06/30/2018 at 9:11 AM EDT Left patient message regarding her Echo. I told her that if she had questions, she should call the office. ------

## 2018-07-19 DIAGNOSIS — E1165 Type 2 diabetes mellitus with hyperglycemia: Secondary | ICD-10-CM | POA: Diagnosis not present

## 2018-07-19 DIAGNOSIS — E782 Mixed hyperlipidemia: Secondary | ICD-10-CM | POA: Diagnosis not present

## 2018-07-19 DIAGNOSIS — I10 Essential (primary) hypertension: Secondary | ICD-10-CM | POA: Diagnosis not present

## 2018-07-21 DIAGNOSIS — I10 Essential (primary) hypertension: Secondary | ICD-10-CM | POA: Diagnosis not present

## 2018-07-21 DIAGNOSIS — Z0001 Encounter for general adult medical examination with abnormal findings: Secondary | ICD-10-CM | POA: Diagnosis not present

## 2018-07-21 DIAGNOSIS — C3430 Malignant neoplasm of lower lobe, unspecified bronchus or lung: Secondary | ICD-10-CM

## 2018-07-21 DIAGNOSIS — Z23 Encounter for immunization: Secondary | ICD-10-CM | POA: Diagnosis not present

## 2018-07-21 HISTORY — DX: Malignant neoplasm of lower lobe, unspecified bronchus or lung: C34.30

## 2018-07-25 ENCOUNTER — Encounter: Payer: Medicare Other | Admitting: Thoracic Surgery (Cardiothoracic Vascular Surgery)

## 2018-07-31 ENCOUNTER — Encounter (HOSPITAL_COMMUNITY): Payer: Self-pay | Admitting: *Deleted

## 2018-07-31 NOTE — Progress Notes (Signed)
Foundation one test cancelled per Dr. Walden Field.

## 2018-08-07 ENCOUNTER — Other Ambulatory Visit: Payer: Self-pay | Admitting: Thoracic Surgery (Cardiothoracic Vascular Surgery)

## 2018-08-07 DIAGNOSIS — C3490 Malignant neoplasm of unspecified part of unspecified bronchus or lung: Secondary | ICD-10-CM

## 2018-08-08 ENCOUNTER — Other Ambulatory Visit: Payer: Self-pay

## 2018-08-08 ENCOUNTER — Ambulatory Visit (INDEPENDENT_AMBULATORY_CARE_PROVIDER_SITE_OTHER): Payer: Self-pay | Admitting: Thoracic Surgery (Cardiothoracic Vascular Surgery)

## 2018-08-08 ENCOUNTER — Ambulatory Visit
Admission: RE | Admit: 2018-08-08 | Discharge: 2018-08-08 | Disposition: A | Discharge status: Home / Self Care | Payer: Medicare Other | Source: Ambulatory Visit | Attending: Thoracic Surgery (Cardiothoracic Vascular Surgery) | Admitting: Thoracic Surgery (Cardiothoracic Vascular Surgery)

## 2018-08-08 ENCOUNTER — Encounter: Payer: Self-pay | Admitting: Thoracic Surgery (Cardiothoracic Vascular Surgery)

## 2018-08-08 VITALS — BP 126/62 | HR 80 | Resp 16 | Ht 66.0 in | Wt 166.5 lb

## 2018-08-08 DIAGNOSIS — C3492 Malignant neoplasm of unspecified part of left bronchus or lung: Secondary | ICD-10-CM | POA: Diagnosis not present

## 2018-08-08 DIAGNOSIS — C3490 Malignant neoplasm of unspecified part of unspecified bronchus or lung: Secondary | ICD-10-CM

## 2018-08-08 DIAGNOSIS — Z902 Acquired absence of lung [part of]: Secondary | ICD-10-CM

## 2018-08-08 DIAGNOSIS — C3432 Malignant neoplasm of lower lobe, left bronchus or lung: Secondary | ICD-10-CM

## 2018-08-08 NOTE — Progress Notes (Signed)
[address]       [place] [address]             [phone]      HPI: Monica [name] returns for a scheduled follow-up visit   Monica [name] is a 77 year old woman with a history of tobacco abuse, gastric MALT lymphoma, type 2 diabetes, hiatal hernia, Schatzki's ring, interstitial cystitis, and depression.  She had a PET CT for follow-up of her gastric MALT lymphoma.  It showed a new left lower lobe lung nodule.  I did a thoracoscopic left lower lobectomy on 05/19/2018.  The nodule was a stage IA (T1, N0) squamous cell carcinoma.  She had atrial fibrillation postoperatively but converted to sinus rhythm with amiodarone.  She otherwise did well.  I saw her in the office in August.  She was having a lot of nausea at that time.  We stopped her amiodarone.  That did not help immediately but her nausea did eventually resolve and she has not had any further problems with that.  She had stopped smoking initially after her surgery, unfortunately she tells me that last week she got upset and started smoking again.  She is not having any incisional pain and has not had any respiratory issues.  Past Medical History:  Diagnosis Date  . Cancer (HCC)    NHL, Malt Lymphoma  . Depression   . DM (diabetes mellitus) ([place])    TYPE  2  . Dysphagia   . Dysrhythmia    History of palpatations  . GERD (gastroesophageal reflux disease)    INGESTION   . Heart palpitations   . Hiatal hernia   . Hyperlipidemia   . Interstitial cystitis   . 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 Sinusitis     Current Outpatient Medications  Medication Sig Dispense Refill  . acetaminophen (TYLENOL) 500 MG tablet Take 2 tablets (1,000 mg total) by mouth every 6 (six) hours. (Patient taking differently: Take 1,000 mg by mouth every 6 (six) hours as needed. ) 30 tablet 0  . Calcium-Magnesium-Vitamin D (CALCIUM 500 PO) Take 500 mg by mouth every other day.     . citalopram (CELEXA)  20 MG tablet Take 20 mg by mouth daily.     . metFORMIN (GLUCOPHAGE-XR) 500 MG 24 hr tablet Take 500 mg 2 (two) times daily by mouth.     . metoprolol tartrate (LOPRESSOR) 25 MG tablet Take 25 mg by mouth every evening.    . Misc. Devices MISC Please provide patient with lightweight transport wheelchair. 1 each 0  . Multiple Vitamin (MULTIVITAMIN WITH MINERALS) TABS tablet Take 1 tablet by mouth daily.    . pantoprazole (PROTONIX) 40 MG tablet Take 1 tablet (40 mg total) by mouth daily before breakfast. 90 tablet 3  . pravastatin (PRAVACHOL) 40 MG tablet Take 40 mg by mouth at bedtime.      No current facility-administered medications for this visit.     Physical Exam BP 126/62 (BP Location: Right Arm, Patient Position: Sitting, Cuff Size: Large)   Pulse 80   Resp 16   Ht 5\' 6"  (1.676 m)   Wt 166 lb 8 oz (75.5 kg)   SpO2 95% Comment: ON RA  BMI 26.77 kg/m  77 year old woman in no acute distress Alert and oriented x3 with no focal deficits Cardiac regular rate and rhythm normal S1 and S2 Lungs lightly diminished at left base, otherwise clear Incisions well-healed  Diagnostic Tests: CHEST - 2 VIEW  COMPARISON:  06/13/2018  FINDINGS: Cardiomediastinal silhouette is normal. Mediastinal contours appear intact. Calcific atherosclerotic disease of the aorta. Moderate in size hiatal hernia.  There is no evidence of focal airspace consolidation, pleural effusion or pneumothorax. Resolving left lower hemithorax atelectasis. Expected decreased left lung volume, left lower lobectomy. Upper lobe predominant moderate emphysema.  Osseous structures are without acute abnormality. Soft tissues are grossly normal.  IMPRESSION: Resolving left lower hemithorax atelectasis, otherwise stable surgical changes from left lower lobectomy.  Moderate in size hiatal hernia.  Calcific atherosclerotic disease and tortuosity of the aorta.   Electronically Signed   By: Fidela Salisbury M.D.   On: 08/08/2018 12:27 I personally reviewed the chest x-ray images and concur with the findings noted above  Impression: Monica [name] is a 77 year old woman with a history of tobacco abuse who also has a history of a gastric MALT lymphoma.  She had a PET CT for follow-up of the lymphoma was found to have a new left lower lobe lung nodule.  This nodule was hypermetabolic.  I did a thoracoscopic left lower lobectomy on her in the nodule turned out to be a stage IA squamous cell carcinoma.  She saw Dr. Walden Field at [facility].  She does not need any adjuvant therapy.  She will continue to be followed there for both the lymphoma and the lung cancer.  Tobacco cessation-unfortunately she started smoking again about a week ago.  I emphasized the importance of quitting.  She is well aware of that.  Postoperative atrial fibrillation-amiodarone discontinued about a month ago.  No evidence of recurrent atrial fibrillation.  Plan: Stop smoking Follow-up with Dr. Walden Field. I will be happy to see Monica [name] back again anytime the future if I can be of any further assistance with her care.  [name], MD [facility] [phone]

## 2018-09-12 ENCOUNTER — Other Ambulatory Visit (HOSPITAL_COMMUNITY): Payer: Self-pay | Admitting: Internal Medicine

## 2018-09-12 DIAGNOSIS — C3432 Malignant neoplasm of lower lobe, left bronchus or lung: Secondary | ICD-10-CM

## 2018-09-19 ENCOUNTER — Other Ambulatory Visit: Payer: Self-pay | Admitting: *Deleted

## 2018-09-19 ENCOUNTER — Ambulatory Visit (INDEPENDENT_AMBULATORY_CARE_PROVIDER_SITE_OTHER): Payer: Medicare Other | Admitting: Gastroenterology

## 2018-09-19 ENCOUNTER — Encounter: Payer: Self-pay | Admitting: Gastroenterology

## 2018-09-19 ENCOUNTER — Encounter: Payer: Self-pay | Admitting: *Deleted

## 2018-09-19 VITALS — BP 124/60 | HR 86 | Temp 97.2°F | Ht 66.0 in | Wt 167.6 lb

## 2018-09-19 DIAGNOSIS — R198 Other specified symptoms and signs involving the digestive system and abdomen: Secondary | ICD-10-CM | POA: Diagnosis not present

## 2018-09-19 DIAGNOSIS — R140 Abdominal distension (gaseous): Secondary | ICD-10-CM | POA: Diagnosis not present

## 2018-09-19 DIAGNOSIS — R1030 Lower abdominal pain, unspecified: Secondary | ICD-10-CM

## 2018-09-19 DIAGNOSIS — Z1211 Encounter for screening for malignant neoplasm of colon: Secondary | ICD-10-CM | POA: Diagnosis not present

## 2018-09-19 MED ORDER — NA SULFATE-K SULFATE-MG SULF 17.5-3.13-1.6 GM/177ML PO SOLN: 1.0000 | ORAL | 0 refills | Status: DC

## 2018-09-19 NOTE — Progress Notes (Signed)
Primary Care Physician: Manon Hilding, MD  Primary Gastroenterologist:  Garfield Cornea, MD   Chief Complaint  Patient presents with  . Gas    burping a lot    HPI: Monica [name] is a 77 y.o. female here for follow-up.  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  Treated with radiation from May to June 2017.  Posttreatment EGD November 2017 revealed chronic gastritis but no evidence of lymphoid cells.  H. pylori was negative.  Her last EGD was August 2019.  She has Schatzki ring status post dilation.  Large hiatal hernia. Focal area about 4 x 4 centimeters along the greater curvature,somewhat erythematous and thickened mucosa (biopsy benign).  Next EGD planned for August 2020.  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  She had thorascopic left lower lobectomy.  Did not require adjuvant therapy.  Rarely has heartburn.  Biggest complaint of lower abdominal bloating, gas, change in bowel habits.  Symptoms worse over the past year.  She has tried Tums, Gas-X, probiotics for over a year without relief.  She notes her metformin was increased from 500 mg daily to 500 mg twice daily about a year ago.  She has a bowel movement sometimes twice daily.  In the past was just once daily.  No constipation.  Stools are soft.  Mucus in the stool but no bleeding.  No family history of colon cancer.  Patient's last colonoscopy was 10 years ago.  She had mild anemia postoperatively after her lung surgery.  Current Outpatient Medications  Medication Sig Dispense Refill  . acetaminophen (TYLENOL) 500 MG tablet Take 2 tablets (1,000 mg total) by mouth every 6 (six) hours. (Patient taking differently: Take 1,000 mg by mouth every 6 (six) hours as needed. ) 30 tablet 0  . Calcium-Magnesium-Vitamin D (CALCIUM 500 PO) Take 500 mg by mouth every other day.     . citalopram (CELEXA) 20 MG tablet Take 20 mg by mouth daily.       . metFORMIN (GLUCOPHAGE-XR) 500 MG 24 hr tablet Take 500 mg 2 (two) times daily by mouth.     . metoprolol tartrate (LOPRESSOR) 25 MG tablet Take 25 mg by mouth every evening.    . Misc. Devices MISC Please provide patient with lightweight transport wheelchair. 1 each 0  . Multiple Vitamin (MULTIVITAMIN WITH MINERALS) TABS tablet Take 1 tablet by mouth daily.    . pantoprazole (PROTONIX) 40 MG tablet Take 1 tablet (40 mg total) by mouth daily before breakfast. 90 tablet 3  . pravastatin (PRAVACHOL) 40 MG tablet Take 40 mg by mouth at bedtime.      No current facility-administered medications for this visit.     Allergies as of 09/19/2018 - Review Complete 09/19/2018  Allergen Reaction Noted  . Sulfur Hives 06/04/2016   Past Medical History:  Diagnosis Date  . Cancer (HCC)    NHL, Malt Lymphoma  . Depression   . 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 Dysphagia   . Dysrhythmia    History of palpatations  . GERD (gastroesophageal reflux disease)    INGESTION   . Heart palpitations   . Hiatal hernia   . Hyperlipidemia   . Interstitial cystitis   . Lung cancer, lower lobe ([place]) 07/21/2018   Left lower lobe, stage Ia squamous cell carcinoma  . MALT (mucosa associated  lymphoid tissue) ([place])    gastric  . Sinusitis    Past Surgical History:  Procedure Laterality Date  . ABDOMINAL HYSTERECTOMY    . BIOPSY  09/01/2016   Procedure: BIOPSY;  Surgeon: Daneil Dolin, MD;  [facility];  Service: Endoscopy;;  gastric  . BLADDER SURGERY    . CATARACT EXTRACTION, BILATERAL    . COLONOSCOPY     Dr. Lindalou Hose 2009: Normal per PCP notes  . ESOPHAGOGASTRODUODENOSCOPY     RMR: Prominant Schzgzki ring/component of peptic stricture status post dilation and disruption as described above, otherwise norma esophagus, moderate-sized hiatal hernia, antal pyloric channel, and posterier bulbar erosions, otherwise unremarkable stomach, D1 and D2 . Inflammatory findings on the stomach  and duodenum will likely be related to aspirin effect. We need to rule out Helicobacter pylor  . ESOPHAGOGASTRODUODENOSCOPY N/A 03/10/2015   Dr. 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 Pathology with lymphoid population of stomach, slight atypia  . ESOPHAGOGASTRODUODENOSCOPY N/A 02/03/2016   Dr. Gala Romney: Schatzki ring noted at GE junction, dilated with 63 and then 88 Morley dilator. Large hiatal hernia. 6 x 7 cm nodular geographically ulcerated mucosa, biopsy c/w MALToma  . ESOPHAGOGASTRODUODENOSCOPY N/A 04/08/2016   Dr. Gala Romney: moderate Schatzki's ring/web s/p dilation, large hiatal hernia, localized area of gastric lymphoma visualized and appeared to be much improved. normal second portion of the duodenum. No specimens collected. Esophageal lumen notably tighted up significantly since her dilation in April of this year.   . ESOPHAGOGASTRODUODENOSCOPY N/A 08/04/2016   Procedure: ESOPHAGOGASTRODUODENOSCOPY (EGD);  Surgeon: Daneil Dolin, MD;  [facility];  Service: Endoscopy;  Laterality: N/A;  7:30 am  . ESOPHAGOGASTRODUODENOSCOPY N/A 09/01/2016   Procedure: ESOPHAGOGASTRODUODENOSCOPY (EGD);  Surgeon: Daneil Dolin, MD;  [facility];  Service: Endoscopy;  Laterality: N/A;  830   . ESOPHAGOGASTRODUODENOSCOPY N/A 05/10/2017   Dr. Gala Romney: Moderate Schatzki ring at the GE junction, status post dilation with 34 [place].  Medium sized hiatal hernia.  Few localized erosions in the gastric antrum.  Stomach biopsy showed chronic gastritis, no H. pylori.  No atypical lymphoid infiltrates or other features of lymphoproliferative process  . ESOPHAGOGASTRODUODENOSCOPY N/A 06/13/2018   Dr. Gala Romney: Schatzki ring status post dilation.  Large hiatal hernia.  Focal area 4 x 4 cm along the greater curvature, somewhat erythematous and thickened mucosa, biopsy benign.  Next EGD in August 2020.  Marland Kitchen LOBECTOMY Left 05/19/2018   Procedure:  LEFT LOWER LOBECTOMY;  Surgeon: [name], MD;  Location: [place];  Service: Thoracic;  Laterality: Left;  Marland Kitchen MALONEY DILATION N/A 03/10/2015   Procedure: Venia Minks DILATION;  Surgeon: Daneil Dolin, MD;  [facility];  Service: Endoscopy;  Laterality: N/A;  . Venia Minks DILATION N/A 02/03/2016   Procedure: Venia Minks DILATION;  Surgeon: Daneil Dolin, MD;  [facility];  Service: Endoscopy;  Laterality: N/A;  . Venia Minks DILATION N/A 04/08/2016   Procedure: Venia Minks DILATION;  Surgeon: Daneil Dolin, MD;  [facility];  Service: Endoscopy;  Laterality: N/A;  . Venia Minks DILATION N/A 05/10/2017   Procedure: Venia Minks DILATION;  Surgeon: Daneil Dolin, MD;  [facility];  Service: Endoscopy;  Laterality: N/A;  . Venia Minks DILATION N/A 06/13/2018   Procedure: Venia Minks DILATION;  Surgeon: Daneil Dolin, MD;  [facility];  Service: Endoscopy;  Laterality: N/A;  . VIDEO ASSISTED THORACOSCOPY (VATS)/WEDGE RESECTION Left 05/19/2018   Procedure:  VIDEO ASSISTED THORACOSCOPY (VATS)/WEDGE RESECTION;  Surgeon: [name], MD;  Location: [facility] OR;  Service: Thoracic;  Laterality: Left;   Family History  Problem Relation Age of Onset  . Heart attack Father   . Dementia Father   . Diabetes Sister   . Diabetes Brother   . Diabetes Sister   . Colon cancer Neg Hx    Social History   Tobacco Use  . Smoking status: Former Smoker    Packs/day: 0.75    Years: 57.00    Pack years: 42.75    Types: Cigarettes    Last attempt to quit: 05/23/2018    Years since quitting: 0.3  . Smokeless tobacco: Never Used  . Tobacco comment: trying to quit, wearing patch  Substance Use Topics  . Alcohol use: No    Alcohol/week: 0.0 standard drinks  . Drug use: No    ROS:  General: Negative for anorexia, weight loss, fever, chills, fatigue, weakness. ENT: Negative for hoarseness, difficulty swallowing , nasal congestion. CV: Negative for chest pain, angina,  palpitations, dyspnea on exertion, peripheral edema.  Respiratory: Negative for dyspnea at rest, dyspnea on exertion, cough, sputum, wheezing.  GI: See history of present illness. GU:  Negative for dysuria, hematuria, urinary incontinence, urinary frequency, nocturnal urination.  Endo: Negative for unusual weight change.    Physical Examination:   BP 124/60   Pulse 86   Temp (!) 97.2 F (36.2 C) (Oral)   Ht 5\' 6"  (1.676 m)   Wt 167 lb 9.6 oz (76 kg)   BMI 27.05 kg/m   General: Well-nourished, well-developed in no acute distress.  Eyes: No icterus. Mouth: Oropharyngeal mucosa moist and pink , no lesions erythema or exudate. Lungs: Clear to auscultation bilaterally.  Heart: Regular rate and rhythm, no murmurs rubs or gallops.  Abdomen: Bowel sounds are normal, mild diffuse lower abdominal discomfort, nondistended, no hepatosplenomegaly or masses, no abdominal bruits or hernia , no rebound or guarding.   Extremities: No lower extremity edema. No clubbing or deformities. Neuro: Alert and oriented x 4   Skin: Warm and dry, no jaundice.   Psych: Alert and cooperative, normal mood and affect.  Labs:  Lab Results  Component Value Date   CREATININE 0.70 05/21/2018   BUN 8 05/21/2018   NA 139 05/21/2018   K 4.5 05/21/2018   CL 103 05/21/2018   CO2 29 05/21/2018   Lab Results  Component Value Date   ALT 17 05/21/2018   AST 23 05/21/2018   ALKPHOS 44 05/21/2018   BILITOT 0.6 05/21/2018   Lab Results  Component Value Date   WBC 8.0 05/21/2018   HGB 10.3 (L) 05/21/2018   HCT 32.8 (L) 05/21/2018   MCV 91.1 05/21/2018   PLT 202 05/21/2018   No results found for: IRON, TIBC, FERRITIN  Imaging Studies: No results found.

## 2018-09-19 NOTE — Patient Instructions (Addendum)
1. Colonoscopy as scheduled.  Please see separate instructions. 2. IF Colonoscopy is unremarkable, would consider discussing potentially holding metformin for a few weeks with your PCP to see if this helps with your digestive issues.

## 2018-09-19 NOTE — Assessment & Plan Note (Signed)
Very pleasant 77 year old female with history of MALT lymphoma, stage Ia squamous cell lung carcinoma as outlined above who presents with worsening lower abdominal bloating/flatulence/increased frequency.  Symptoms are somewhat vague but occurring over a year and worsening per patient.  CT imaging 1 year ago.  Labs have been unremarkable back in July except for postoperative mild anemia.  Patient requesting colonoscopy as her last one was 10 years ago.  Given change in bowel habits, offer her a colonoscopy for further evaluation.  I have discussed the risks, alternatives, benefits with regards to but not limited to the risk of reaction to medication, bleeding, infection, perforation and the patient is agreeable to proceed. Written consent to be obtained.  If colonoscopy unremarkable, would consider discussing with PCP possibility of holding metformin for a few weeks to see if her digestive issues improved.  We will not rule out possible need for updated CT imaging of the abdomen/pelvis.

## 2018-09-19 NOTE — Progress Notes (Signed)
CC'D TO PCP °

## 2018-10-09 ENCOUNTER — Inpatient Hospital Stay (HOSPITAL_COMMUNITY): Payer: Medicare Other | Attending: Internal Medicine

## 2018-10-09 ENCOUNTER — Encounter (HOSPITAL_COMMUNITY)
Admission: RE | Admit: 2018-10-09 | Discharge: 2018-10-09 | Disposition: A | Discharge status: Home / Self Care | Payer: Medicare Other | Source: Ambulatory Visit | Attending: Internal Medicine | Admitting: Internal Medicine

## 2018-10-09 DIAGNOSIS — R918 Other nonspecific abnormal finding of lung field: Secondary | ICD-10-CM

## 2018-10-09 DIAGNOSIS — C3432 Malignant neoplasm of lower lobe, left bronchus or lung: Secondary | ICD-10-CM | POA: Insufficient documentation

## 2018-10-09 DIAGNOSIS — C884 Extranodal marginal zone B-cell lymphoma of mucosa-associated lymphoid tissue [MALT-lymphoma]: Secondary | ICD-10-CM

## 2018-10-09 LAB — COMPREHENSIVE METABOLIC PANEL
ALBUMIN: 4.1 g/dL (ref 3.5–5.0)
ALT: 17 U/L (ref 0–44)
AST: 27 U/L (ref 15–41)
Alkaline Phosphatase: 59 U/L (ref 38–126)
Anion gap: 11 (ref 5–15)
BUN: 9 mg/dL (ref 8–23)
CO2: 24 mmol/L (ref 22–32)
CREATININE: 0.79 mg/dL (ref 0.44–1.00)
Calcium: 9.3 mg/dL (ref 8.9–10.3)
Chloride: 100 mmol/L (ref 98–111)
GFR calc Af Amer: >60 mL/min (ref >60)
GLUCOSE: 225 mg/dL — AB (ref 70–99)
POTASSIUM: 4.5 mmol/L (ref 3.5–5.1)
SODIUM: 135 mmol/L (ref 135–145)
Total Bilirubin: 0.5 mg/dL (ref 0.3–1.2)
Total Protein: 8 g/dL (ref 6.5–8.1)

## 2018-10-09 LAB — LACTATE DEHYDROGENASE: LDH: 117 U/L (ref 98–192)

## 2018-10-09 LAB — CBC WITH DIFFERENTIAL/PLATELET
ABS IMMATURE GRANULOCYTES: 0.01 10*3/uL (ref 0.00–0.07)
BASOS ABS: 0 10*3/uL (ref 0.0–0.1)
BASOS PCT: 1 %
Eosinophils Absolute: 0.1 10*3/uL (ref 0.0–0.5)
Eosinophils Relative: 2 %
HCT: 39.1 % (ref 36.0–46.0)
Hemoglobin: 12.3 g/dL (ref 12.0–15.0)
Immature Granulocytes: 0 %
Lymphocytes Relative: 41 %
Lymphs Abs: 2.4 10*3/uL (ref 0.7–4.0)
MCH: 28.3 pg (ref 26.0–34.0)
MCHC: 31.5 g/dL (ref 30.0–36.0)
MCV: 90.1 fL (ref 80.0–100.0)
MONOS PCT: 9 %
Monocytes Absolute: 0.5 10*3/uL (ref 0.1–1.0)
NEUTROS ABS: 2.8 10*3/uL (ref 1.7–7.7)
NEUTROS PCT: 47 %
PLATELETS: 268 10*3/uL (ref 150–400)
RBC: 4.34 MIL/uL (ref 3.87–5.11)
RDW: 14.9 % (ref 11.5–15.5)
WBC: 5.9 10*3/uL (ref 4.0–10.5)
nRBC: 0 % (ref 0.0–0.2)

## 2018-10-10 LAB — BETA 2 MICROGLOBULIN, SERUM: BETA 2 MICROGLOBULIN: 1.8 mg/L (ref 0.6–2.4)

## 2018-10-11 ENCOUNTER — Other Ambulatory Visit (HOSPITAL_COMMUNITY): Payer: Medicare Other

## 2018-10-11 ENCOUNTER — Ambulatory Visit (HOSPITAL_COMMUNITY): Payer: Medicare Other | Admitting: Internal Medicine

## 2018-10-18 ENCOUNTER — Ambulatory Visit (HOSPITAL_COMMUNITY): Payer: Medicare Other | Admitting: Internal Medicine

## 2018-10-18 ENCOUNTER — Ambulatory Visit (HOSPITAL_COMMUNITY): Admission: RE | Admit: 2018-10-18 | Payer: Medicare Other | Source: Ambulatory Visit

## 2018-10-19 ENCOUNTER — Other Ambulatory Visit (HOSPITAL_COMMUNITY): Payer: Self-pay | Admitting: Internal Medicine

## 2018-10-19 ENCOUNTER — Ambulatory Visit (HOSPITAL_COMMUNITY)
Admission: RE | Admit: 2018-10-19 | Discharge: 2018-10-19 | Disposition: A | Discharge status: Home / Self Care | Payer: Medicare Other | Source: Ambulatory Visit | Attending: Internal Medicine | Admitting: Internal Medicine

## 2018-10-19 ENCOUNTER — Encounter (HOSPITAL_COMMUNITY): Payer: Self-pay

## 2018-10-19 DIAGNOSIS — C3432 Malignant neoplasm of lower lobe, left bronchus or lung: Secondary | ICD-10-CM | POA: Diagnosis not present

## 2018-10-19 LAB — GLUCOSE, CAPILLARY: GLUCOSE-CAPILLARY: 147 mg/dL — AB (ref 70–99)

## 2018-10-19 MED ORDER — FLUDEOXYGLUCOSE F - 18 (FDG) INJECTION
8.8000 | Freq: Once | INTRAVENOUS | Status: AC | PRN
  Administered 2018-10-19: 8.8 via INTRAVENOUS

## 2018-11-06 ENCOUNTER — Other Ambulatory Visit: Payer: Self-pay

## 2018-11-06 ENCOUNTER — Inpatient Hospital Stay (HOSPITAL_COMMUNITY): Payer: Medicare Other | Attending: Internal Medicine | Admitting: Internal Medicine

## 2018-11-06 ENCOUNTER — Encounter (HOSPITAL_COMMUNITY): Payer: Self-pay | Admitting: Internal Medicine

## 2018-11-06 VITALS — BP 135/54 | HR 79 | Temp 97.7°F | Resp 14 | Wt 167.2 lb

## 2018-11-06 DIAGNOSIS — E119 Type 2 diabetes mellitus without complications: Secondary | ICD-10-CM | POA: Insufficient documentation

## 2018-11-06 DIAGNOSIS — R221 Localized swelling, mass and lump, neck: Secondary | ICD-10-CM | POA: Insufficient documentation

## 2018-11-06 DIAGNOSIS — Z720 Tobacco use: Secondary | ICD-10-CM

## 2018-11-06 DIAGNOSIS — Z8572 Personal history of non-Hodgkin lymphomas: Secondary | ICD-10-CM | POA: Diagnosis not present

## 2018-11-06 DIAGNOSIS — C3432 Malignant neoplasm of lower lobe, left bronchus or lung: Secondary | ICD-10-CM | POA: Diagnosis not present

## 2018-11-06 NOTE — Patient Instructions (Signed)
[facility]  Discharge Instructions: You saw Dr. Higgs today                               _______________________________________________________________  Thank you for choosing [facility] to provide your oncology and hematology care.  To afford each patient quality time with our providers, please arrive at least 15 minutes before your scheduled appointment.  You need to re-schedule your appointment if you arrive 10 or more minutes late.  We strive to give you quality time with our providers, and arriving late affects you and other patients whose appointments are after yours.  Also, if you no show three or more times for appointments you may be dismissed from the clinic.  Again, thank you for choosing [facility]. Our hope is that these requests will allow you access to exceptional care and in a timely manner. _______________________________________________________________  If you have questions after your visit, please contact our office at [phone] between the hours of 8:30 a.m. and 5:00 p.m. Voicemails left after 4:30 p.m. will not be returned until the following business day. _______________________________________________________________  For prescription refill requests, have your pharmacy contact our office. _______________________________________________________________  Recommendations made by the consultant and any test results will be sent to your referring physician. _______________________________________________________________ 

## 2018-11-06 NOTE — Progress Notes (Signed)
Diagnosis Malignant neoplasm of lower lobe of left lung ([place]) - Plan: CBC with Differential/Platelet, Comprehensive metabolic panel, Lactate dehydrogenase, NM PET Image Restag (PS) Skull Base To Thigh  Staging Cancer Staging Lung cancer [facility]) Staging form: Lung, AJCC 8th Edition - Clinical: cT1b, cN0 - Signed by Zoila Shutter, MD on 06/06/2018  MALToma [facility]) Staging form: Lymphoid Neoplasms, AJCC 6th Edition - Clinical stage from 03/23/2016: Stage I - Signed by Baird Cancer, PA-C on 03/23/2016   Assessment and Plan:  1.  NHL, Gastric Malt Lymphoma.  Pt was previously followed by Dr. Talbert Cage.  She was diagnosed in 2016.  She was H. Pylori Negative.  She was treated with RT.    CT of abdomen and pelvis done 09/19/2018 showed  IMPRESSION: 1. No acute findings within the abdomen or pelvis. 2. Mild hepatic steatosis. 3. Moderate hiatal hernia. No stomach mass or wall thickening to suggest recurrent gastric lymphoma. 4. 3.4 cm lower pole right renal cyst. 5. Status post hysterectomy. 6. Left colon diverticula without evidence of diverticulitis. 7. Aortic atherosclerosis.  Pt had Pet scan done 10/19/2018 that was reviewed and showed  IMPRESSION: 1. No active malignancy is identified. 2. Interval left lower lobectomy. 3. Other imaging findings of potential clinical significance: Aortic Atherosclerosis (ICD10-I70.0) and Emphysema (ICD10-J43.9). Coronary atherosclerosis. Sigmoid colon diverticulosis. Large hiatal hernia.  Pt shows no evidence of recurrent lymphoma.  She will be set up for PET scan in 04/2019  for interval follow-up.    2.  T1b N0 NSCLC ( Stage 1A2).  Pt was complaining of some abdominal and pelvic discomfort.  She was  set up for PET scan for repeat evaluation, especially based on lung nodules noted on prior CT.    Pet scan done 04/10/2018 showed IMPRESSION: 1. Hypermetabolic solid 1.4 cm left lower lobe pulmonary nodule, new, suspicious for primary bronchogenic  carcinoma given the advanced smoking related changes in the lungs. 2. No hypermetabolic thoracic adenopathy or distant metastatic disease. 3. Mild hypermetabolism in the antral region of the stomach without CT correlate, nonspecific, which could be due to peristalsis, inflammatory change or early recurrent tumor. 4. Chronic findings include: Aortic Atherosclerosis (ICD10-I70.0) and Emphysema (ICD10-J43.9). Marked sigmoid diverticulosis. Moderate to large hiatal hernia. Three-vessel coronary atherosclerosis.  She was notified via phone of results.  She reported she had a vacation planned and desired to wait for evaluation until after her return.  Pt was referred to Dr. Roxan Hockey for evaluation.  Upon evaluation by Dr. Roxan Hockey she was set up for surgery and underwent left lower lobe resection on 05/19/2018 with pathology returning as 1. 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 - THE SURGICAL RESECTION MARGINS ARE NEGATIVE FOR CARCINOMA. - SEE ONCOLOGY TABLE BELOW.  2 level 9 and 2 level 11 lymph nodes were negative for malignancy.    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    Level 7, level 11, level 4L, level 12, level 5 lymph nodes were all negative.    She was staged as a T1 b N0 stage IA2 non-small cell lung cancer.    Adjuvant chemotherapy would not be recommended.  MRI of the brain to complete her staging was done 06/12/2018 was negative.    Pet scan done 10/19/2018 was reviewed and showed  IMPRESSION: 1. No active malignancy is identified. 2. Interval left lower lobectomy. 3. Other imaging findings of potential clinical significance: Aortic Atherosclerosis (ICD10-I70.0) and  Emphysema (ICD10-J43.9). Coronary atherosclerosis. Sigmoid colon diverticulosis. Large hiatal hernia.  Ongoing smoking cessation recommended.  She will be set up for PET  scan in 04/2019 for follow-up and will be seen at that time to go over results.    3.  Throat concerns.  Pt reports she feels right neck more swollen since surgery and intubation.  Pt was given option of ENT referral but desires to wait for this unless symptoms change.  No visible enlargement noted on exam today.    4.  Smoking. Cessation recommended due to recent diagnosis of SCC.  She is wearing patch but reports occasional smoking is rare. Reiterated importance of ongoing smoking cessation due to lung cancer history.    5.  DM.  Follow-up with PCP.    6  Health maintenance.  Screening mammogram done 04/2018 was negative.  She will have repeat mammogram in 04/2019. Follow-up with GI as recommended due to gastric lymphoma history.    25 minutes spent with more than 50% spent in counseling and coordination of care.    Interval History:  Historical data obtained from the note dated 04/06/2018.  78 yr old female previously followed by Dr. Talbert Cage.  repeat upper endoscopy performed on 05/10/2017 which demonstrated no sign of residual or recurrent lymphoma.  CT abdomen pelvis with contrast on 09/19/2017 demonstrated no stomach mass or wall thickening to suggest recurrent gastric lymphoma.  Repeat CT chest without contrast to monitor her pulmonary nodules on 03/02/2017 demonstrated previously noted small left-sided pulmonary nodules are stable compared to prior examinations, considered benign at this time, presumably subpleural lymph nodes. No larger more suspicious appearing pulmonary nodules or masses are noted.   Current Status:  Pt is seen today for follow-up.  She is here to go over PET scan.  She is wearing nicotine patch.  She reports occasional cigarettes.    MALToma ([place])   03/21/2015 Miscellaneous    H Pylori IgG NEGATIVE    02/03/2016 Procedure    EGD Dr. Gala Romney, abnormal gastric mucosa. Pathology with EXTRANODAL Marginal zone lymphoma. NEGATIVE for H. Pylori    03/03/2016 Miscellaneous    H pylori  stool antigen NEGATIVE    03/03/2016 PET scan    Focal area of hypermetabolism and wall thickening involving the body antral junction region of the stomach c/w history of lymphoma. 5.5 mm LLL pulm nodule not hypermetabolic. Non contrast chest CT in 6 month    03/18/2016 - 04/08/2016 Radiation Therapy    The gastric tumor received 30 Gy in 15 fractions of 2 Gy     Lung cancer ([place])   05/19/2018 Initial Diagnosis    Lung cancer ([place])    06/06/2018 Cancer Staging    Staging form: Lung, AJCC 8th Edition - Clinical: cT1b, cN0 - Signed by Zoila Shutter, MD on 06/06/2018      Problem List Patient Active Problem List   Diagnosis Date Noted  . Change in bowel function [R19.8] 09/19/2018  . S/P lobectomy of lung [Z90.2] 05/19/2018  . Lung cancer ([place]) [C34.90] 05/19/2018  . Early satiety [R68.81] 05/02/2018  . Colon cancer screening [Z12.11] 05/02/2018  . Flatulence/gas pain/belching [R14.0] 09/16/2017  . Lower abdominal pain [R10.30] 09/16/2017  . Nodule of left lung [R91.1] 01/07/2017  . Nausea without vomiting [R11.0] 04/06/2016  . Loss of weight [R63.4] 04/06/2016  . MALToma ([place]) [C88.4] 03/12/2016  . Mucosal abnormality of stomach [K31.89]   . Schatzki's ring [K22.2]   . Hiatal hernia [K44.9]   . HYPERCHOLESTEROLEMIA [E78.00] 09/02/2010  .  DEPRESSION, MILD [F32.9] 09/02/2010  . TACHYCARDIA [R00.0] 09/02/2010  . Dysphagia [R13.10] 09/02/2010    Past Medical History Past Medical History:  Diagnosis Date  . Cancer (HCC)    NHL, Malt Lymphoma  . Depression   . 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 Dysphagia   . Dysrhythmia    History of palpatations  . GERD (gastroesophageal reflux disease)    INGESTION   . Heart palpitations   . Hiatal hernia   . Hyperlipidemia   . Interstitial cystitis   . Lung cancer, lower lobe ([place]) 07/21/2018   Left lower lobe, stage Ia squamous cell carcinoma  . 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 Sinusitis     Past  Surgical History Past Surgical History:  Procedure Laterality Date  . ABDOMINAL HYSTERECTOMY    . BIOPSY  09/01/2016   Procedure: BIOPSY;  Surgeon: Daneil Dolin, MD;  [facility];  Service: Endoscopy;;  gastric  . BLADDER SURGERY    . CATARACT EXTRACTION, BILATERAL    . COLONOSCOPY     Dr. Lindalou Hose 2009: Normal per PCP notes  . ESOPHAGOGASTRODUODENOSCOPY     RMR: Prominant Schzgzki ring/component of peptic stricture status post dilation and disruption as described above, otherwise norma esophagus, moderate-sized hiatal hernia, antal pyloric channel, and posterier bulbar erosions, otherwise unremarkable stomach, D1 and D2 . Inflammatory findings on the stomach and duodenum will likely be related to aspirin effect. We need to rule out Helicobacter pylor  . ESOPHAGOGASTRODUODENOSCOPY N/A 03/10/2015   Dr. 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 Pathology with lymphoid population of stomach, slight atypia  . ESOPHAGOGASTRODUODENOSCOPY N/A 02/03/2016   Dr. Gala Romney: Schatzki ring noted at GE junction, dilated with 28 and then 53 Brooks dilator. Large hiatal hernia. 6 x 7 cm nodular geographically ulcerated mucosa, biopsy c/w MALToma  . ESOPHAGOGASTRODUODENOSCOPY N/A 04/08/2016   Dr. Gala Romney: moderate Schatzki's ring/web s/p dilation, large hiatal hernia, localized area of gastric lymphoma visualized and appeared to be much improved. normal second portion of the duodenum. No specimens collected. Esophageal lumen notably tighted up significantly since her dilation in April of this year.   . ESOPHAGOGASTRODUODENOSCOPY N/A 08/04/2016   Procedure: ESOPHAGOGASTRODUODENOSCOPY (EGD);  Surgeon: Daneil Dolin, MD;  [facility];  Service: Endoscopy;  Laterality: N/A;  7:30 am  . ESOPHAGOGASTRODUODENOSCOPY N/A 09/01/2016   Procedure: ESOPHAGOGASTRODUODENOSCOPY (EGD);  Surgeon: Daneil Dolin, MD;  [facility];   Service: Endoscopy;  Laterality: N/A;  830   . ESOPHAGOGASTRODUODENOSCOPY N/A 05/10/2017   Dr. Gala Romney: Moderate Schatzki ring at the GE junction, status post dilation with 70 [place].  Medium sized hiatal hernia.  Few localized erosions in the gastric antrum.  Stomach biopsy showed chronic gastritis, no H. pylori.  No atypical lymphoid infiltrates or other features of lymphoproliferative process  . ESOPHAGOGASTRODUODENOSCOPY N/A 06/13/2018   Dr. Gala Romney: Schatzki ring status post dilation.  Large hiatal hernia.  Focal area 4 x 4 cm along the greater curvature, somewhat erythematous and thickened mucosa, biopsy benign.  Next EGD in August 2020.  Marland Kitchen LOBECTOMY Left 05/19/2018   Procedure: LEFT LOWER LOBECTOMY;  Surgeon: [name], MD;  Location: [place];  Service: Thoracic;  Laterality: Left;  Marland Kitchen MALONEY DILATION N/A 03/10/2015   Procedure: Venia Minks DILATION;  Surgeon: Daneil Dolin, MD;  [facility];  Service: Endoscopy;  Laterality: N/A;  .  MALONEY DILATION N/A 02/03/2016   Procedure: Venia Minks DILATION;  Surgeon: Daneil Dolin, MD;  [facility];  Service: Endoscopy;  Laterality: N/A;  . Venia Minks DILATION N/A 04/08/2016   Procedure: Venia Minks DILATION;  Surgeon: Daneil Dolin, MD;  [facility];  Service: Endoscopy;  Laterality: N/A;  . Venia Minks DILATION N/A 05/10/2017   Procedure: Venia Minks DILATION;  Surgeon: Daneil Dolin, MD;  [facility];  Service: Endoscopy;  Laterality: N/A;  . Venia Minks DILATION N/A 06/13/2018   Procedure: Venia Minks DILATION;  Surgeon: Daneil Dolin, MD;  [facility];  Service: Endoscopy;  Laterality: N/A;  . VIDEO ASSISTED THORACOSCOPY (VATS)/WEDGE RESECTION Left 05/19/2018   Procedure: VIDEO ASSISTED THORACOSCOPY (VATS)/WEDGE RESECTION;  Surgeon: [name], MD;  [facility];  Service: Thoracic;  Laterality: Left;    Family History Family History  Problem Relation Age of Onset  . Heart attack Father   .  Dementia Father   . Diabetes Sister   . Diabetes Brother   . Diabetes Sister   . Colon cancer Neg Hx      Social History  reports that she quit smoking about 5 months ago. Her smoking use included cigarettes. She has a 42.75 pack-year smoking history. She has never used smokeless tobacco. She reports that she does not drink alcohol or use drugs.  Medications  Current Outpatient Medications:  .  acetaminophen (TYLENOL) 500 MG tablet, Take 2 tablets (1,000 mg total) by mouth every 6 (six) hours. (Patient taking differently: Take 1,000 mg by mouth every 6 (six) hours as needed. ), Disp: 30 tablet, Rfl: 0 .  Calcium-Magnesium-Vitamin D (CALCIUM 500 PO), Take 500 mg by mouth every other day. , Disp: , Rfl:  .  citalopram (CELEXA) 20 MG tablet, Take 20 mg by mouth daily. , Disp: , Rfl:  .  metFORMIN (GLUCOPHAGE-XR) 500 MG 24 hr tablet, Take 500 mg 2 (two) times daily by mouth. , Disp: , Rfl:  .  metoprolol tartrate (LOPRESSOR) 25 MG tablet, Take 25 mg by mouth every evening., Disp: , Rfl:  .  Misc. Devices MISC, Please provide patient with lightweight transport wheelchair., Disp: 1 each, Rfl: 0 .  Multiple Vitamin (MULTIVITAMIN WITH MINERALS) TABS tablet, Take 1 tablet by mouth daily., Disp: , Rfl:  .  Na Sulfate-K Sulfate-Mg Sulf 17.5-3.13-1.6 GM/177ML SOLN, Take 1 kit by mouth as directed., Disp: 1 Bottle, Rfl: 0 .  pantoprazole (PROTONIX) 40 MG tablet, Take 1 tablet (40 mg total) by mouth daily before breakfast., Disp: 90 tablet, Rfl: 3 .  pravastatin (PRAVACHOL) 40 MG tablet, Take 40 mg by mouth at bedtime. , Disp: , Rfl:   Allergies Sulfur  Review of Systems Review of Systems - Oncology ROS negative other than throat concerns.     Physical Exam  Vitals Wt Readings from Last 3 Encounters:  11/06/18 167 lb 3.2 oz (75.8 kg)  09/19/18 167 lb 9.6 oz (76 kg)  08/08/18 166 lb 8 oz (75.5 kg)   Temp Readings from Last 3 Encounters:  11/06/18 97.7 F (36.5 C) (Oral)  09/19/18 (!) 97.2  F (36.2 C) (Oral)  06/16/18 98.3 F (36.8 C) (Oral)   BP Readings from Last 3 Encounters:  11/06/18 (!) 135/54  09/19/18 124/60  08/08/18 126/62   Pulse Readings from Last 3 Encounters:  11/06/18 79  09/19/18 86  08/08/18 80   Constitutional: Well-developed, well-nourished, and in no distress.   HENT: Head: Normocephalic and atraumatic.  Mouth/Throat:  No oropharyngeal exudate. Mucosa moist. Eyes: Pupils are equal, round, and reactive to light. Conjunctivae are normal. No scleral icterus.  Neck: Normal range of motion. Neck supple. No JVD present.  Cardiovascular: Normal rate, regular rhythm and normal heart sounds.  Exam reveals no gallop and no friction rub.   No murmur heard. Pulmonary/Chest: Effort normal and breath sounds normal. No respiratory distress. No wheezes.No rales.  Abdominal: Soft. Bowel sounds are normal. No distension. There is no tenderness. There is no guarding.  Musculoskeletal: No edema or tenderness.  Lymphadenopathy: No cervical, axillary or supraclavicular adenopathy.  Neurological: Alert and oriented to person, place, and time. No cranial nerve deficit.  Skin: Skin is warm and dry. No rash noted. No erythema. No pallor.  Psychiatric: Affect and judgment normal.   Labs No visits with results within 3 Day(s) from this visit.  Latest known visit with results is:  Hospital Outpatient Visit on 10/19/2018  Component Date Value Ref Range Status  . Glucose-Capillary 10/19/2018 147* 70 - 99 mg/dL Final     Pathology Orders Placed This Encounter  Procedures  . NM PET Image Restag (PS) Skull Base To Thigh    Standing Status:   Future    Standing Expiration Date:   11/06/2019    Order Specific Question:   If indicated for the ordered procedure, I authorize the administration of a radiopharmaceutical per Radiology protocol    Answer:   Yes    Order Specific Question:   Preferred imaging location?    Answer:   [facility]    Order Specific Question:    Radiology Contrast Protocol - do NOT remove file path    Answer:   [email or web address]  . CBC with Differential/Platelet    Standing Status:   Future    Standing Expiration Date:   11/07/2019  . Comprehensive metabolic panel    Standing Status:   Future    Standing Expiration Date:   11/07/2019  . Lactate dehydrogenase    Standing Status:   Future    Standing Expiration Date:   11/07/2019       Zoila Shutter MD

## 2018-11-15 DIAGNOSIS — E1165 Type 2 diabetes mellitus with hyperglycemia: Secondary | ICD-10-CM | POA: Diagnosis not present

## 2018-11-15 DIAGNOSIS — F17210 Nicotine dependence, cigarettes, uncomplicated: Secondary | ICD-10-CM | POA: Diagnosis not present

## 2018-11-15 DIAGNOSIS — I10 Essential (primary) hypertension: Secondary | ICD-10-CM | POA: Diagnosis not present

## 2018-11-15 DIAGNOSIS — E782 Mixed hyperlipidemia: Secondary | ICD-10-CM | POA: Diagnosis not present

## 2018-11-21 DIAGNOSIS — C884 Extranodal marginal zone B-cell lymphoma of mucosa-associated lymphoid tissue [MALT-lymphoma]: Secondary | ICD-10-CM | POA: Diagnosis not present

## 2018-11-21 DIAGNOSIS — F17200 Nicotine dependence, unspecified, uncomplicated: Secondary | ICD-10-CM | POA: Diagnosis not present

## 2018-11-21 DIAGNOSIS — E1165 Type 2 diabetes mellitus with hyperglycemia: Secondary | ICD-10-CM | POA: Diagnosis not present

## 2018-11-21 DIAGNOSIS — I10 Essential (primary) hypertension: Secondary | ICD-10-CM | POA: Diagnosis not present

## 2018-11-21 DIAGNOSIS — F411 Generalized anxiety disorder: Secondary | ICD-10-CM | POA: Diagnosis not present

## 2018-11-21 DIAGNOSIS — R233 Spontaneous ecchymoses: Secondary | ICD-10-CM | POA: Diagnosis not present

## 2018-11-21 DIAGNOSIS — E782 Mixed hyperlipidemia: Secondary | ICD-10-CM | POA: Diagnosis not present

## 2018-11-21 DIAGNOSIS — R05 Cough: Secondary | ICD-10-CM | POA: Diagnosis not present

## 2018-11-21 DIAGNOSIS — Z6825 Body mass index (BMI) 25.0-25.9, adult: Secondary | ICD-10-CM | POA: Diagnosis not present

## 2018-11-21 DIAGNOSIS — C3490 Malignant neoplasm of unspecified part of unspecified bronchus or lung: Secondary | ICD-10-CM | POA: Diagnosis not present

## 2018-11-29 ENCOUNTER — Encounter (HOSPITAL_COMMUNITY)
Admission: RE | Disposition: A | Discharge status: Home / Self Care | Payer: Self-pay | Source: Home / Self Care | Attending: Internal Medicine

## 2018-11-29 ENCOUNTER — Other Ambulatory Visit: Payer: Self-pay

## 2018-11-29 ENCOUNTER — Ambulatory Visit (HOSPITAL_COMMUNITY)
Admission: RE | Admit: 2018-11-29 | Discharge: 2018-11-29 | Disposition: A | Discharge status: Home / Self Care | Payer: Medicare Other | Attending: Internal Medicine | Admitting: Internal Medicine

## 2018-11-29 DIAGNOSIS — K219 Gastro-esophageal reflux disease without esophagitis: Secondary | ICD-10-CM | POA: Diagnosis not present

## 2018-11-29 DIAGNOSIS — Z79899 Other long term (current) drug therapy: Secondary | ICD-10-CM | POA: Diagnosis not present

## 2018-11-29 DIAGNOSIS — K5730 Diverticulosis of large intestine without perforation or abscess without bleeding: Secondary | ICD-10-CM | POA: Diagnosis not present

## 2018-11-29 DIAGNOSIS — D125 Benign neoplasm of sigmoid colon: Secondary | ICD-10-CM | POA: Diagnosis not present

## 2018-11-29 DIAGNOSIS — R1030 Lower abdominal pain, unspecified: Secondary | ICD-10-CM

## 2018-11-29 DIAGNOSIS — R194 Change in bowel habit: Secondary | ICD-10-CM | POA: Diagnosis not present

## 2018-11-29 DIAGNOSIS — E119 Type 2 diabetes mellitus without complications: Secondary | ICD-10-CM | POA: Insufficient documentation

## 2018-11-29 DIAGNOSIS — F329 Major depressive disorder, single episode, unspecified: Secondary | ICD-10-CM | POA: Insufficient documentation

## 2018-11-29 DIAGNOSIS — Z87891 Personal history of nicotine dependence: Secondary | ICD-10-CM | POA: Insufficient documentation

## 2018-11-29 DIAGNOSIS — Z8249 Family history of ischemic heart disease and other diseases of the circulatory system: Secondary | ICD-10-CM | POA: Insufficient documentation

## 2018-11-29 DIAGNOSIS — Z85118 Personal history of other malignant neoplasm of bronchus and lung: Secondary | ICD-10-CM | POA: Diagnosis not present

## 2018-11-29 DIAGNOSIS — K449 Diaphragmatic hernia without obstruction or gangrene: Secondary | ICD-10-CM | POA: Insufficient documentation

## 2018-11-29 DIAGNOSIS — Z1211 Encounter for screening for malignant neoplasm of colon: Secondary | ICD-10-CM | POA: Diagnosis not present

## 2018-11-29 DIAGNOSIS — R198 Other specified symptoms and signs involving the digestive system and abdomen: Secondary | ICD-10-CM

## 2018-11-29 DIAGNOSIS — E785 Hyperlipidemia, unspecified: Secondary | ICD-10-CM | POA: Insufficient documentation

## 2018-11-29 DIAGNOSIS — Z7984 Long term (current) use of oral hypoglycemic drugs: Secondary | ICD-10-CM | POA: Insufficient documentation

## 2018-11-29 HISTORY — PX: BIOPSY: SHX5522

## 2018-11-29 HISTORY — PX: POLYPECTOMY: SHX5525

## 2018-11-29 HISTORY — PX: COLONOSCOPY: SHX5424

## 2018-11-29 LAB — GLUCOSE, CAPILLARY: Glucose-Capillary: 164 mg/dL — ABNORMAL HIGH (ref 70–99)

## 2018-11-29 SURGERY — COLONOSCOPY
Anesthesia: Moderate Sedation

## 2018-11-29 MED ORDER — SODIUM CHLORIDE 0.9 % IV SOLN
INTRAVENOUS | Status: DC
  Administered 2018-11-29: 07:00:00 via INTRAVENOUS

## 2018-11-29 MED ORDER — MIDAZOLAM HCL 5 MG/5ML IJ SOLN
INTRAMUSCULAR | Status: AC
  Filled 2018-11-29: qty 10

## 2018-11-29 MED ORDER — MIDAZOLAM HCL 5 MG/5ML IJ SOLN
INTRAMUSCULAR | Status: DC | PRN
  Administered 2018-11-29 (×4): 1 mg via INTRAVENOUS

## 2018-11-29 MED ORDER — MEPERIDINE HCL 100 MG/ML IJ SOLN
INTRAMUSCULAR | Status: DC | PRN
  Administered 2018-11-29: 25 mg

## 2018-11-29 MED ORDER — MEPERIDINE HCL 50 MG/ML IJ SOLN
INTRAMUSCULAR | Status: AC
  Filled 2018-11-29: qty 1

## 2018-11-29 MED ORDER — ONDANSETRON HCL 4 MG/2ML IJ SOLN
INTRAMUSCULAR | Status: AC
  Filled 2018-11-29: qty 2

## 2018-11-29 MED ORDER — ONDANSETRON HCL 4 MG/2ML IJ SOLN
INTRAMUSCULAR | Status: DC | PRN
  Administered 2018-11-29: 4 mg via INTRAVENOUS

## 2018-11-29 NOTE — Op Note (Signed)
[facility] Patient Name: Monica [name] Procedure Date: 11/29/2018 7:24 AM MRN: [id number] Date of Birth: Nov 10, 1940 Attending MD: Norvel Richards , MD CSN: [id number] Age: 78 Admit Type: Outpatient Procedure:                Colonoscopy Indications:              Change in bowel habits, Change in stool caliber;                            loose stools Providers:                Norvel Richards, MD, Janeece Riggers, RN, Gerome Sam, RN, Nelma Rothman, Technician Referring MD:              Medicines:                Midazolam 4 mg IV, Meperidine 25 mg IV, Ondansetron                            4 mg IV Complications:            No immediate complications. Estimated Blood Loss:     Estimated blood loss was minimal. Procedure:                Pre-Anesthesia Assessment:                           - Prior to the procedure, a History and Physical                            was performed, and patient medications and                            allergies were reviewed. The patient's tolerance of                            previous anesthesia was also reviewed. The risks                            and benefits of the procedure and the sedation                            options and risks were discussed with the patient.                            All questions were answered, and informed consent                            was obtained. Prior Anticoagulants: The patient has                            taken no previous anticoagulant or antiplatelet  agents. ASA Grade Assessment: II - A patient with                            mild systemic disease. After reviewing the risks                            and benefits, the patient was deemed in                            satisfactory condition to undergo the procedure.                           After obtaining informed consent, the colonoscope                            was passed under direct  vision. Throughout the                            procedure, the patient's blood pressure, pulse, and                            oxygen saturations were monitored continuously. The                            CF-[id number] ([id number]) scope was introduced through                            the anus and advanced to the 5 cm into the ileum.                            The colonoscopy was performed without difficulty.                            The patient tolerated the procedure well. The                            quality of the bowel preparation was adequate. The                            terminal ileum, ileocecal valve, appendiceal                            orifice, and rectum were photographed. The entire                            colon was well visualized. Scope In: 7:45:09 AM Scope Out: 8:06:03 AM Scope Withdrawal Time: 0 hours 10 minutes 49 seconds  Total Procedure Duration: 0 hours 20 minutes 54 seconds  Findings:      The perianal and digital rectal examinations were normal.      Multiple small and large-mouthed diverticula were found in the sigmoid       colon.      Two sessile polyps were found in the sigmoid colon. The polyps were 5 to  6 mm in size. These polyps were removed with a cold snare. Resection and       retrieval were complete. Estimated blood loss was minimal.      A 10 mm polyp was found in the sigmoid colon. The polyp was sessile. The       polyp was removed with a hot snare. Resection and retrieval were       complete. Estimated blood loss: none.      The exam was otherwise without abnormality on direct and retroflexion       views. Distal 5 cm of TI appeared normal. R and L colon biopsiede to       assess for microscopic colitis Impression:               - Diverticulosis in the sigmoid colon.                           - Two 5 to 6 mm polyps in the sigmoid colon,                            removed with a cold snare. Resected and retrieved.                            - One 10 mm polyp in the sigmoid colon, removed                            with a hot snare. Resected and retrieved.                           - The examination was otherwise normal on direct                            and retroflexion views. S/P segtmental Bx. Moderate Sedation:      Moderate (conscious) sedation was administered by the endoscopy nurse       and supervised by the endoscopist. The following parameters were       monitored: oxygen saturation, heart rate, blood pressure, respiratory       rate, EKG, adequacy of pulmonary ventilation, and response to care.       Total physician intraservice time was 24 minutes. Recommendation:           - Patient has a contact number available for                            emergencies. The signs and symptoms of potential                            delayed complications were discussed with the                            patient. Return to normal activities tomorrow.                            Written discharge instructions were provided to the  patient.                           - Resume previous diet.                           - Continue present medications.                           - Await pathology results.                           - Repeat colonoscopy date to be determined after                            pending pathology results are reviewed for                            surveillance based on pathology results.                           - Return to GI clinic (date not yet determined). Procedure Code(s):        --- Professional ---                           [phone], Colonoscopy, flexible; with removal of                            tumor(s), polyp(s), or other lesion(s) by snare                            technique                           99153, Moderate sedation; each additional 15                            minutes intraservice time                           [id number], Moderate sedation services provided  by the                            same physician or other qualified health care                            professional performing a gastrointestinal                            endoscopic service that sedation supports,                            requiring the presence of an independent trained                            observer to assist in the monitoring of the  patient's level of consciousness and physiological                            status; initial 15 minutes of intra-service time;                            patient age 62 years or older (additional time may                            be reported with [phone], as appropriate) Diagnosis Code(s):        --- Professional ---                           D12.5, Benign neoplasm of sigmoid colon                           R19.4, Change in bowel habit                           R19.5, Other fecal abnormalities                           K57.30, Diverticulosis of large intestine without                            perforation or abscess without bleeding CPT copyright 2018 [employer]. All rights reserved. The codes documented in this report are preliminary and upon coder review may  be revised to meet current compliance requirements. Cristopher Estimable. Andru Genter, MD Norvel Richards, MD 11/29/2018 8:17:00 AM This report has been signed electronically. Number of Addenda: 0

## 2018-11-29 NOTE — Discharge Instructions (Signed)
Colonoscopy Discharge Instructions  Read the instructions outlined below and refer to this sheet in the next few weeks. These discharge instructions provide you with general information on caring for yourself after you leave the hospital. Your doctor may also give you specific instructions. While your treatment has been planned according to the most current medical practices available, unavoidable complications occasionally occur. If you have any problems or questions after discharge, call Dr. Gala Romney at [phone]. ACTIVITY  You may resume your regular activity, but move at a slower pace for the next 24 hours.   Take frequent rest periods for the next 24 hours.   Walking will help get rid of the air and reduce the bloated feeling in your belly (abdomen).   No driving for 24 hours (because of the medicine (anesthesia) used during the test).    Do not sign any important legal documents or operate any machinery for 24 hours (because of the anesthesia used during the test).  NUTRITION  Drink plenty of fluids.   You may resume your normal diet as instructed by your doctor.   Begin with a light meal and progress to your normal diet. Heavy or fried foods are harder to digest and may make you feel sick to your stomach (nauseated).   Avoid alcoholic beverages for 24 hours or as instructed.  MEDICATIONS  You may resume your normal medications unless your doctor tells you otherwise.  WHAT YOU CAN EXPECT TODAY  Some feelings of bloating in the abdomen.   Passage of more gas than usual.   Spotting of blood in your stool or on the toilet paper.  IF YOU HAD POLYPS REMOVED DURING THE COLONOSCOPY:  No aspirin products for 7 days or as instructed.   No alcohol for 7 days or as instructed.   Eat a soft diet for the next 24 hours.  FINDING OUT THE RESULTS OF YOUR TEST Not all test results are available during your visit. If your test results are not back during the visit, make an appointment  with your caregiver to find out the results. Do not assume everything is normal if you have not heard from your caregiver or the medical facility. It is important for you to follow up on all of your test results.  SEEK IMMEDIATE MEDICAL ATTENTION IF:  You have more than a spotting of blood in your stool.   Your belly is swollen (abdominal distention).   You are nauseated or vomiting.   You have a temperature over 101.   You have abdominal pain or discomfort that is severe or gets worse throughout the day.    Diverticulosis and colon polyp information provided  Further recommendations to follow pending review of pathology report   Colon Polyps  Polyps are tissue growths inside the body. Polyps can grow in many places, including the large intestine (colon). A polyp may be a round bump or a mushroom-shaped growth. You could have one polyp or several. Most colon polyps are noncancerous (benign). However, some colon polyps can become cancerous over time. Finding and removing the polyps early can help prevent this. What are the causes? The exact cause of colon polyps is not known. What increases the risk? You are more likely to develop this condition if you:  Have a family history of colon cancer or colon polyps.  Are older than 52 or older than 45 if you are African American.  Have inflammatory bowel disease, such as ulcerative colitis or Crohn's disease.  Have certain hereditary  conditions, such as: ? Familial adenomatous polyposis. ? Lynch syndrome. ? Turcot syndrome. ? Peutz-Jeghers syndrome.  Are overweight.  Smoke cigarettes.  Do not get enough exercise.  Drink too much alcohol.  Eat a diet that is high in fat and red meat and low in fiber.  Had childhood cancer that was treated with abdominal radiation. What are the signs or symptoms? Most polyps do not cause symptoms. If you have symptoms, they may include:  Blood coming from your rectum when having a bowel  movement.  Blood in your stool. The stool may look dark red or black.  Abdominal pain.  A change in bowel habits, such as constipation or diarrhea. How is this diagnosed? This condition is diagnosed with a colonoscopy. This is a procedure in which a lighted, flexible scope is inserted into the anus and then passed into the colon to examine the area. Polyps are sometimes found when a colonoscopy is done as part of routine cancer screening tests. How is this treated? Treatment for this condition involves removing any polyps that are found. Most polyps can be removed during a colonoscopy. Those polyps will then be tested for cancer. Additional treatment may be needed depending on the results of testing. Follow these instructions at home: Lifestyle  Maintain a healthy weight, or lose weight if recommended by your health care provider.  Exercise every day or as told by your health care provider.  Do not use any products that contain nicotine or tobacco, such as cigarettes and e-cigarettes. If you need help quitting, ask your health care provider.  If you drink alcohol, limit how much you have: ? 0-1 drink a day for women. ? 0-2 drinks a day for men.  Be aware of how much alcohol is in your drink. In the [place] one drink equals one 12 oz bottle of beer (355 mL), one 5 oz glass of wine (148 mL), or one 1 oz shot of hard liquor (44 mL). Eating and drinking   Eat foods that are high in fiber, such as fruits, vegetables, and whole grains.  Eat foods that are high in calcium and vitamin D, such as milk, cheese, yogurt, eggs, liver, fish, and broccoli.  Limit foods that are high in fat, such as fried foods and desserts.  Limit the amount of red meat and processed meat you eat, such as hot dogs, sausage, bacon, and lunch meats. General instructions  Keep all follow-up visits as told by your health care provider. This is important. ? This includes having regularly scheduled  colonoscopies. ? Talk to your health care provider about when you need a colonoscopy. Contact a health care provider if:  You have new or worsening bleeding during a bowel movement.  You have new or increased blood in your stool.  You have a change in bowel habits.  You lose weight for no known reason. Summary  Polyps are tissue growths inside the body. Polyps can grow in many places, including the colon.  Most colon polyps are noncancerous (benign), but some can become cancerous over time.  This condition is diagnosed with a colonoscopy.  Treatment for this condition involves removing any polyps that are found. Most polyps can be removed during a colonoscopy. This information is not intended to replace advice given to you by your health care provider. Make sure you discuss any questions you have with your health care provider. Document Released: 07/14/2004 Document Revised: 02/02/2018 Document Reviewed: 02/02/2018 Elsevier Interactive Patient Education  2019 [employer].  Diverticulosis  Diverticulosis is a condition that develops when small pouches (diverticula) form in the wall of the large intestine (colon). The colon is where water is absorbed and stool is formed. The pouches form when the inside layer of the colon pushes through weak spots in the outer layers of the colon. You may have a few pouches or many of them. What are the causes? The cause of this condition is not known. What increases the risk? The following factors may make you more likely to develop this condition:  Being older than age 65. Your risk for this condition increases with age. Diverticulosis is rare among people younger than age 53. By age 78, many people have it.  Eating a low-fiber diet.  Having frequent constipation.  Being overweight.  Not getting enough exercise.  Smoking.  Taking over-the-counter pain medicines, like aspirin and ibuprofen.  Having a family history of  diverticulosis. What are the signs or symptoms? In most people, there are no symptoms of this condition. If you do have symptoms, they may include:  Bloating.  Cramps in the abdomen.  Constipation or diarrhea.  Pain in the lower left side of the abdomen. How is this diagnosed? This condition is most often diagnosed during an exam for other colon problems. Because diverticulosis usually has no symptoms, it often cannot be diagnosed independently. This condition may be diagnosed by:  Using a flexible scope to examine the colon (colonoscopy).  Taking an X-ray of the colon after dye has been put into the colon (barium enema).  Doing a CT scan. How is this treated? You may not need treatment for this condition if you have never developed an infection related to diverticulosis. If you have had an infection before, treatment may include:  Eating a high-fiber diet. This may include eating more fruits, vegetables, and grains.  Taking a fiber supplement.  Taking a live bacteria supplement (probiotic).  Taking medicine to relax your colon.  Taking antibiotic medicines. Follow these instructions at home:  Drink 6-8 glasses of water or more each day to prevent constipation.  Try not to strain when you have a bowel movement.  If you have had an infection before: ? Eat more fiber as directed by your health care provider or your diet and nutrition specialist (dietitian). ? Take a fiber supplement or probiotic, if your health care provider approves.  Take over-the-counter and prescription medicines only as told by your health care provider.  If you were prescribed an antibiotic, take it as told by your health care provider. Do not stop taking the antibiotic even if you start to feel better.  Keep all follow-up visits as told by your health care provider. This is important. Contact a health care provider if:  You have pain in your abdomen.  You have bloating.  You have  cramps.  You have not had a bowel movement in 3 days. Get help right away if:  Your pain gets worse.  Your bloating becomes very bad.  You have a fever or chills, and your symptoms suddenly get worse.  You vomit.  You have bowel movements that are bloody or black.  You have bleeding from your rectum. Summary  Diverticulosis is a condition that develops when small pouches (diverticula) form in the wall of the large intestine (colon).  You may have a few pouches or many of them.  This condition is most often diagnosed during an exam for other colon problems.  If you have had an infection related  to diverticulosis, treatment may include increasing the fiber in your diet, taking supplements, or taking medicines. This information is not intended to replace advice given to you by your health care provider. Make sure you discuss any questions you have with your health care provider. Document Released: 07/15/2004 Document Revised: 09/06/2016 Document Reviewed: 09/06/2016 Elsevier Interactive Patient Education  2019 [employer].

## 2018-11-29 NOTE — H&P (Signed)
@LOGO @   Primary Care Physician:  Manon Hilding, MD Primary Gastroenterologist:  Dr.  Gala Romney  Pre-Procedure History & Physical: HPI:  Monica [name] is a 78 y.o. female here for change in bowel habits.  PET recently demonstrated diverticulosis with no evidence of increased uptake suggesting inflammation or active tumor anywhere.  Past Medical History:  Diagnosis Date  . Cancer (HCC)    NHL, Malt Lymphoma  . Depression   . 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 Dysphagia   . Dysrhythmia    History of palpatations  . GERD (gastroesophageal reflux disease)    INGESTION   . Heart palpitations   . Hiatal hernia   . Hyperlipidemia   . Interstitial cystitis   . Lung cancer, lower lobe ([place]) 07/21/2018   Left lower lobe, stage Ia squamous cell carcinoma  . 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 Sinusitis     Past Surgical History:  Procedure Laterality Date  . ABDOMINAL HYSTERECTOMY    . BIOPSY  09/01/2016   Procedure: BIOPSY;  Surgeon: Daneil Dolin, MD;  [facility];  Service: Endoscopy;;  gastric  . BLADDER SURGERY    . CATARACT EXTRACTION, BILATERAL    . COLONOSCOPY     Dr. Lindalou Hose 2009: Normal per PCP notes  . ESOPHAGOGASTRODUODENOSCOPY     RMR: Prominant Schzgzki ring/component of peptic stricture status post dilation and disruption as described above, otherwise norma esophagus, moderate-sized hiatal hernia, antal pyloric channel, and posterier bulbar erosions, otherwise unremarkable stomach, D1 and D2 . Inflammatory findings on the stomach and duodenum will likely be related to aspirin effect. We need to rule out Helicobacter pylor  . ESOPHAGOGASTRODUODENOSCOPY N/A 03/10/2015   Dr. 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 Pathology with lymphoid population of stomach, slight atypia  . ESOPHAGOGASTRODUODENOSCOPY N/A 02/03/2016   Dr. Gala Romney: Schatzki ring noted at GE  junction, dilated with 87 and then 15 [place] dilator. Large hiatal hernia. 6 x 7 cm nodular geographically ulcerated mucosa, biopsy c/w MALToma  . ESOPHAGOGASTRODUODENOSCOPY N/A 04/08/2016   Dr. Gala Romney: moderate Schatzki's ring/web s/p dilation, large hiatal hernia, localized area of gastric lymphoma visualized and appeared to be much improved. normal second portion of the duodenum. No specimens collected. Esophageal lumen notably tighted up significantly since her dilation in April of this year.   . ESOPHAGOGASTRODUODENOSCOPY N/A 08/04/2016   Procedure: ESOPHAGOGASTRODUODENOSCOPY (EGD);  Surgeon: Daneil Dolin, MD;  [facility];  Service: Endoscopy;  Laterality: N/A;  7:30 am  . ESOPHAGOGASTRODUODENOSCOPY N/A 09/01/2016   Procedure: ESOPHAGOGASTRODUODENOSCOPY (EGD);  Surgeon: Daneil Dolin, MD;  [facility];  Service: Endoscopy;  Laterality: N/A;  830   . ESOPHAGOGASTRODUODENOSCOPY N/A 05/10/2017   Dr. Gala Romney: Moderate Schatzki ring at the GE junction, status post dilation with 90 [place].  Medium sized hiatal hernia.  Few localized erosions in the gastric antrum.  Stomach biopsy showed chronic gastritis, no H. pylori.  No atypical lymphoid infiltrates or other features of lymphoproliferative process  . ESOPHAGOGASTRODUODENOSCOPY N/A 06/13/2018   Dr. Gala Romney: Schatzki ring status post dilation.  Large hiatal hernia.  Focal area 4 x 4 cm along the greater curvature, somewhat erythematous and thickened mucosa, biopsy benign.  Next EGD in August 2020.  Marland Kitchen LOBECTOMY Left 05/19/2018   Procedure: LEFT LOWER LOBECTOMY;  Surgeon: [name], MD;  Location: [facility];  Service: Thoracic;  Laterality: Left;  Marland Kitchen MALONEY DILATION N/A 03/10/2015   Procedure: Venia Minks DILATION;  Surgeon: Daneil Dolin, MD;  [facility];  Service: Endoscopy;  Laterality: N/A;  . Venia Minks DILATION N/A 02/03/2016   Procedure: Venia Minks DILATION;  Surgeon: Daneil Dolin, MD;  [facility];   Service: Endoscopy;  Laterality: N/A;  . Venia Minks DILATION N/A 04/08/2016   Procedure: Venia Minks DILATION;  Surgeon: Daneil Dolin, MD;  [facility];  Service: Endoscopy;  Laterality: N/A;  . Venia Minks DILATION N/A 05/10/2017   Procedure: Venia Minks DILATION;  Surgeon: Daneil Dolin, MD;  [facility];  Service: Endoscopy;  Laterality: N/A;  . Venia Minks DILATION N/A 06/13/2018   Procedure: Venia Minks DILATION;  Surgeon: Daneil Dolin, MD;  [facility];  Service: Endoscopy;  Laterality: N/A;  . VIDEO ASSISTED THORACOSCOPY (VATS)/WEDGE RESECTION Left 05/19/2018   Procedure: VIDEO ASSISTED THORACOSCOPY (VATS)/WEDGE RESECTION;  Surgeon: [name], MD;  Location: [facility];  Service: Thoracic;  Laterality: Left;    Prior to Admission medications   Medication Sig Start Date End Date Taking? Authorizing Provider  acetaminophen (TYLENOL) 325 MG tablet Take 650 mg by mouth every 6 (six) hours as needed for moderate pain or headache.   Yes [provider]  Calcium-Magnesium-Vitamin D (CALCIUM 500 PO) Take 1 tablet by mouth every evening.    Yes [provider]  citalopram (CELEXA) 20 MG tablet Take 20 mg by mouth daily.  12/10/14  Yes [provider]  metFORMIN (GLUCOPHAGE-XR) 500 MG 24 hr tablet Take 500 mg 2 (two) times daily by mouth.  12/10/14  Yes [provider]  metoprolol tartrate (LOPRESSOR) 25 MG tablet Take 25 mg by mouth every evening. 12/07/17  Yes [provider]  Multiple Vitamin (MULTIVITAMIN WITH MINERALS) TABS tablet Take 1 tablet by mouth daily.   Yes [provider]  Na Sulfate-K Sulfate-Mg Sulf 17.5-3.13-1.6 GM/177ML SOLN Take 1 kit by mouth as directed. 09/19/18  Yes Daneil Dolin, MD  pantoprazole (PROTONIX) 40 MG tablet Take 1 tablet (40 mg total) by mouth daily before breakfast. 05/18/18  Yes Annitta Needs, NP  Polyvinyl Alcohol-Povidone PF (REFRESH) 1.4-0.6 % SOLN Place 1 drop into both eyes 2 (two)  times daily.   Yes [provider]  pravastatin (PRAVACHOL) 40 MG tablet Take 40 mg by mouth at bedtime.  07/12/16  Yes [provider]  [place]. Devices MISC Please provide patient with lightweight transport wheelchair. Patient not taking: Reported on 11/20/2018 06/06/18   Zoila Shutter, MD    Allergies as of 09/19/2018 - Review Complete 09/19/2018  Allergen Reaction Noted  . Sulfur Hives 06/04/2016    Family History  Problem Relation Age of Onset  . Heart attack Father   . Dementia Father   . Diabetes Sister   . Diabetes Brother   . Diabetes Sister   . Colon cancer Neg Hx     Social History   Socioeconomic History  . Marital status: Married    Spouse name: Not on file  . Number of children: Not on file  . Years of education: Not on file  . Highest education level: Not on file  Occupational History  . Not on file  Social Needs  . Financial resource strain: Not on file  . Food insecurity:    Worry: Not on file    Inability: Not on file  . Transportation needs:    Medical: Not on file  Non-medical: Not on file  Tobacco Use  . Smoking status: Former Smoker    Packs/day: 0.75    Years: 57.00    Pack years: 42.75    Types: Cigarettes    Last attempt to quit: 05/23/2018    Years since quitting: 0.5  . Smokeless tobacco: Never Used  . Tobacco comment: trying to quit, wearing patch  Substance and Sexual Activity  . Alcohol use: No    Alcohol/week: 0.0 standard drinks  . Drug use: No  . Sexual activity: Not on file  Lifestyle  . Physical activity:    Days per week: Not on file    Minutes per session: Not on file  . Stress: Not on file  Relationships  . Social connections:    Talks on phone: Not on file    Gets together: Not on file    Attends religious service: Not on file    Active member of club or organization: Not on file    Attends meetings of clubs or organizations: Not on file    Relationship status: Not on file  . Intimate partner  violence:    Fear of current or ex partner: Not on file    Emotionally abused: Not on file    Physically abused: Not on file    Forced sexual activity: Not on file  Other Topics Concern  . Not on file  Social History Narrative  . Not on file    Review of Systems: See HPI, otherwise negative ROS  Physical Exam: BP 118/70   Pulse (!) 39   Temp 97.6 F (36.4 C) (Oral)   Resp 12   Ht 5' 6"  (1.676 m)   Wt 74.4 kg   SpO2 97%   BMI 26.47 kg/m  General:   Alert,  Well-developed, well-nourished, pleasant and cooperative in NAD Skin:  Intact without significant lesions or rashes. Eyes:  Sclera clear, no icterus.   Conjunctiva pink. Ears:  Normal auditory acuity. Nose:  No deformity, discharge,  or lesions. Mouth:  No deformity or lesions. Neck:  Supple; no masses or thyromegaly. No significant cervical adenopathy. Lungs:  Clear throughout to auscultation.   No wheezes, crackles, or rhonchi. No acute distress. Heart:  Regular rate and rhythm; no murmurs, clicks, rubs,  or gallops. Abdomen: Non-distended, normal bowel sounds.  Soft and nontender without appreciable mass or hepatosplenomegaly.  Pulses:  Normal pulses noted. Extremities:  Without clubbing or edema.  Impression/Plan: 78 year old lady with change in bowel habits.  Here for diagnostic colonoscopy.  The risks, benefits, limitations, alternatives and imponderables have been reviewed with the patient. Questions have been answered. All parties are agreeable.      Notice: This dictation was prepared with Dragon dictation along with smaller phrase technology. Any transcriptional errors that result from this process are unintentional and may not be corrected upon review.

## 2018-11-30 ENCOUNTER — Telehealth: Payer: Self-pay

## 2018-11-30 ENCOUNTER — Encounter: Payer: Self-pay | Admitting: Internal Medicine

## 2018-11-30 NOTE — Telephone Encounter (Signed)
Per RMR- Send copy of letter with path to referring provider and PCP.  This is on Monica [name]. Please see that she has a follow-up appointment with an extender in the next couple of months to reassess change in bowel habits.

## 2018-12-04 ENCOUNTER — Encounter (HOSPITAL_COMMUNITY): Payer: Self-pay | Admitting: Internal Medicine

## 2018-12-04 ENCOUNTER — Encounter: Payer: Self-pay | Admitting: Internal Medicine

## 2018-12-04 DIAGNOSIS — N390 Urinary tract infection, site not specified: Secondary | ICD-10-CM | POA: Diagnosis not present

## 2018-12-04 DIAGNOSIS — I493 Ventricular premature depolarization: Secondary | ICD-10-CM | POA: Diagnosis not present

## 2018-12-04 DIAGNOSIS — Z6825 Body mass index (BMI) 25.0-25.9, adult: Secondary | ICD-10-CM | POA: Diagnosis not present

## 2018-12-04 NOTE — Telephone Encounter (Signed)
OV made and letter mailed °

## 2019-01-15 DIAGNOSIS — N952 Postmenopausal atrophic vaginitis: Secondary | ICD-10-CM | POA: Diagnosis not present

## 2019-01-15 DIAGNOSIS — N3020 Other chronic cystitis without hematuria: Secondary | ICD-10-CM | POA: Diagnosis not present

## 2019-02-07 ENCOUNTER — Ambulatory Visit: Payer: Medicare Other | Admitting: Gastroenterology

## 2019-02-19 ENCOUNTER — Ambulatory Visit: Payer: Medicare Other | Admitting: Gastroenterology

## 2019-03-06 DIAGNOSIS — L570 Actinic keratosis: Secondary | ICD-10-CM | POA: Diagnosis not present

## 2019-03-06 DIAGNOSIS — Z85828 Personal history of other malignant neoplasm of skin: Secondary | ICD-10-CM | POA: Diagnosis not present

## 2019-03-06 DIAGNOSIS — L819 Disorder of pigmentation, unspecified: Secondary | ICD-10-CM | POA: Diagnosis not present

## 2019-03-23 DIAGNOSIS — E782 Mixed hyperlipidemia: Secondary | ICD-10-CM | POA: Diagnosis not present

## 2019-03-23 DIAGNOSIS — F411 Generalized anxiety disorder: Secondary | ICD-10-CM | POA: Diagnosis not present

## 2019-03-23 DIAGNOSIS — Z720 Tobacco use: Secondary | ICD-10-CM | POA: Diagnosis not present

## 2019-03-23 DIAGNOSIS — E1165 Type 2 diabetes mellitus with hyperglycemia: Secondary | ICD-10-CM | POA: Diagnosis not present

## 2019-03-23 DIAGNOSIS — I10 Essential (primary) hypertension: Secondary | ICD-10-CM | POA: Diagnosis not present

## 2019-03-27 DIAGNOSIS — C884 Extranodal marginal zone B-cell lymphoma of mucosa-associated lymphoid tissue [MALT-lymphoma]: Secondary | ICD-10-CM | POA: Diagnosis not present

## 2019-03-27 DIAGNOSIS — Z6826 Body mass index (BMI) 26.0-26.9, adult: Secondary | ICD-10-CM | POA: Diagnosis not present

## 2019-03-27 DIAGNOSIS — C3490 Malignant neoplasm of unspecified part of unspecified bronchus or lung: Secondary | ICD-10-CM | POA: Diagnosis not present

## 2019-03-27 DIAGNOSIS — Z23 Encounter for immunization: Secondary | ICD-10-CM | POA: Diagnosis not present

## 2019-03-27 DIAGNOSIS — E1165 Type 2 diabetes mellitus with hyperglycemia: Secondary | ICD-10-CM | POA: Diagnosis not present

## 2019-03-27 DIAGNOSIS — M25472 Effusion, left ankle: Secondary | ICD-10-CM | POA: Diagnosis not present

## 2019-03-27 DIAGNOSIS — Z1389 Encounter for screening for other disorder: Secondary | ICD-10-CM | POA: Diagnosis not present

## 2019-03-27 DIAGNOSIS — I10 Essential (primary) hypertension: Secondary | ICD-10-CM | POA: Diagnosis not present

## 2019-04-05 ENCOUNTER — Other Ambulatory Visit (HOSPITAL_COMMUNITY): Payer: Medicare Other

## 2019-04-12 ENCOUNTER — Ambulatory Visit (HOSPITAL_COMMUNITY): Payer: Medicare Other | Admitting: Hematology

## 2019-04-30 ENCOUNTER — Other Ambulatory Visit: Payer: Self-pay

## 2019-04-30 ENCOUNTER — Encounter (HOSPITAL_COMMUNITY)
Admission: RE | Admit: 2019-04-30 | Discharge: 2019-04-30 | Disposition: A | Discharge status: Home / Self Care | Payer: Medicare Other | Source: Ambulatory Visit | Attending: Internal Medicine | Admitting: Internal Medicine

## 2019-04-30 DIAGNOSIS — J439 Emphysema, unspecified: Secondary | ICD-10-CM | POA: Insufficient documentation

## 2019-04-30 DIAGNOSIS — K5730 Diverticulosis of large intestine without perforation or abscess without bleeding: Secondary | ICD-10-CM | POA: Diagnosis not present

## 2019-04-30 DIAGNOSIS — I2510 Atherosclerotic heart disease of native coronary artery without angina pectoris: Secondary | ICD-10-CM | POA: Diagnosis not present

## 2019-04-30 DIAGNOSIS — C3431 Malignant neoplasm of lower lobe, right bronchus or lung: Secondary | ICD-10-CM | POA: Diagnosis not present

## 2019-04-30 DIAGNOSIS — K760 Fatty (change of) liver, not elsewhere classified: Secondary | ICD-10-CM | POA: Insufficient documentation

## 2019-04-30 DIAGNOSIS — C3432 Malignant neoplasm of lower lobe, left bronchus or lung: Secondary | ICD-10-CM | POA: Insufficient documentation

## 2019-04-30 DIAGNOSIS — Z902 Acquired absence of lung [part of]: Secondary | ICD-10-CM | POA: Insufficient documentation

## 2019-04-30 DIAGNOSIS — I700 Atherosclerosis of aorta: Secondary | ICD-10-CM | POA: Diagnosis not present

## 2019-04-30 DIAGNOSIS — K449 Diaphragmatic hernia without obstruction or gangrene: Secondary | ICD-10-CM | POA: Diagnosis not present

## 2019-04-30 LAB — GLUCOSE, CAPILLARY: Glucose-Capillary: 166 mg/dL — ABNORMAL HIGH (ref 70–99)

## 2019-04-30 MED ORDER — FLUDEOXYGLUCOSE F - 18 (FDG) INJECTION
8.3200 | Freq: Once | INTRAVENOUS | Status: AC | PRN
  Administered 2019-04-30: 8.32 via INTRAVENOUS

## 2019-05-01 ENCOUNTER — Other Ambulatory Visit (HOSPITAL_COMMUNITY): Payer: Self-pay | Admitting: *Deleted

## 2019-05-01 DIAGNOSIS — C884 Extranodal marginal zone B-cell lymphoma of mucosa-associated lymphoid tissue [MALT-lymphoma]: Secondary | ICD-10-CM

## 2019-05-01 DIAGNOSIS — C3490 Malignant neoplasm of unspecified part of unspecified bronchus or lung: Secondary | ICD-10-CM

## 2019-05-01 DIAGNOSIS — C3432 Malignant neoplasm of lower lobe, left bronchus or lung: Secondary | ICD-10-CM

## 2019-05-02 ENCOUNTER — Other Ambulatory Visit: Payer: Self-pay

## 2019-05-02 ENCOUNTER — Encounter (HOSPITAL_COMMUNITY): Payer: Self-pay | Admitting: Hematology

## 2019-05-02 ENCOUNTER — Inpatient Hospital Stay (HOSPITAL_BASED_OUTPATIENT_CLINIC_OR_DEPARTMENT_OTHER): Payer: Medicare Other | Admitting: Hematology

## 2019-05-02 ENCOUNTER — Inpatient Hospital Stay (HOSPITAL_COMMUNITY): Payer: Medicare Other | Attending: Hematology

## 2019-05-02 DIAGNOSIS — Z923 Personal history of irradiation: Secondary | ICD-10-CM | POA: Insufficient documentation

## 2019-05-02 DIAGNOSIS — C3490 Malignant neoplasm of unspecified part of unspecified bronchus or lung: Secondary | ICD-10-CM

## 2019-05-02 DIAGNOSIS — K2950 Unspecified chronic gastritis without bleeding: Secondary | ICD-10-CM | POA: Insufficient documentation

## 2019-05-02 DIAGNOSIS — Z8572 Personal history of non-Hodgkin lymphomas: Secondary | ICD-10-CM | POA: Diagnosis not present

## 2019-05-02 DIAGNOSIS — C884 Extranodal marginal zone B-cell lymphoma of mucosa-associated lymphoid tissue [MALT-lymphoma]: Secondary | ICD-10-CM

## 2019-05-02 DIAGNOSIS — C3432 Malignant neoplasm of lower lobe, left bronchus or lung: Secondary | ICD-10-CM | POA: Insufficient documentation

## 2019-05-02 LAB — CBC WITH DIFFERENTIAL/PLATELET
Abs Immature Granulocytes: 0.02 10*3/uL (ref 0.00–0.07)
Basophils Absolute: 0.1 10*3/uL (ref 0.0–0.1)
Basophils Relative: 1 %
Eosinophils Absolute: 0.1 10*3/uL (ref 0.0–0.5)
Eosinophils Relative: 2 %
HCT: 38.5 % (ref 36.0–46.0)
Hemoglobin: 12.3 g/dL (ref 12.0–15.0)
Immature Granulocytes: 0 %
Lymphocytes Relative: 32 %
Lymphs Abs: 2.2 10*3/uL (ref 0.7–4.0)
MCH: 28.3 pg (ref 26.0–34.0)
MCHC: 31.9 g/dL (ref 30.0–36.0)
MCV: 88.5 fL (ref 80.0–100.0)
Monocytes Absolute: 0.7 10*3/uL (ref 0.1–1.0)
Monocytes Relative: 10 %
Neutro Abs: 3.9 10*3/uL (ref 1.7–7.7)
Neutrophils Relative %: 55 %
Platelets: 253 10*3/uL (ref 150–400)
RBC: 4.35 MIL/uL (ref 3.87–5.11)
RDW: 15.2 % (ref 11.5–15.5)
WBC: 7 10*3/uL (ref 4.0–10.5)
nRBC: 0 % (ref 0.0–0.2)

## 2019-05-02 LAB — COMPREHENSIVE METABOLIC PANEL
ALT: 25 U/L (ref 0–44)
AST: 27 U/L (ref 15–41)
Albumin: 4 g/dL (ref 3.5–5.0)
Alkaline Phosphatase: 69 U/L (ref 38–126)
Anion gap: 8 (ref 5–15)
BUN: 12 mg/dL (ref 8–23)
CO2: 26 mmol/L (ref 22–32)
Calcium: 9.5 mg/dL (ref 8.9–10.3)
Chloride: 100 mmol/L (ref 98–111)
Creatinine, Ser: 0.84 mg/dL (ref 0.44–1.00)
GFR calc Af Amer: >60 mL/min (ref >60)
GFR calc non Af Amer: >60 mL/min (ref >60)
Glucose, Bld: 200 mg/dL — ABNORMAL HIGH (ref 70–99)
Potassium: 4.5 mmol/L (ref 3.5–5.1)
Sodium: 134 mmol/L — ABNORMAL LOW (ref 135–145)
Total Bilirubin: 0.3 mg/dL (ref 0.3–1.2)
Total Protein: 7.6 g/dL (ref 6.5–8.1)

## 2019-05-02 LAB — LACTATE DEHYDROGENASE: LDH: 130 U/L (ref 98–192)

## 2019-05-02 NOTE — Assessment & Plan Note (Addendum)
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  H. pylori negative. -She underwent radiation therapy 30 Gy in 15 fractions from 03/18/2016 through 04/08/2016. - Last EGD and biopsy on 06/13/2018 shows mild chronic gastritis with small lymphoid aggregate with no features of lymphoma. - PET CT scan on 04/30/2019 did not show any hypermetabolic activity in the stomach region. -LDH and other labs are normal.  Does not have any B symptoms.  She reportedly was scheduled for EGD in March but rescheduled it secondary to [place]. -Physical exam does not reveal any palpable adenopathy or splenomegaly.  2.  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 - We reviewed PET CT scan dated 04/30/2019.  New hypermetabolic focus in the right lower lobe associated with a 0.9 cm pulmonary nodule, maximum SUV 2.6.  On the prior exam in December 2019 this area was blurred by motion artifact and only manifested as equivocal nodularity along the bronchovascular branching.  Right infrahilar accentuated metabolic activity with maximum SUV 4.3 (previously maximum SUV 3.4) raising the possibility of right infrahilar adenopathy. -We have discussed further options including evaluation by Dr. Roxan Hockey versus wait and watch with repeat scan in 3 to 4 months.  She opted for repeat scan.  We will schedule her follow-up in 3 months with repeat PET CT scan.

## 2019-05-02 NOTE — Patient Instructions (Signed)
[facility] at [facility] Discharge Instructions  You were seen today by Dr. Katragadda. He went over your recent lab and scan results. He will see you back in 3 months for labs and follow up.   Thank you for choosing [facility] at [facility] to provide your oncology and hematology care.  To afford each patient quality time with our provider, please arrive at least 15 minutes before your scheduled appointment time.   If you have a lab appointment with the Cancer Center please come in thru the  Main Entrance and check in at the main information desk  You need to re-schedule your appointment should you arrive 10 or more minutes late.  We strive to give you quality time with our providers, and arriving late affects you and other patients whose appointments are after yours.  Also, if you no show three or more times for appointments you may be dismissed from the clinic at the providers discretion.     Again, thank you for choosing [facility].  Our hope is that these requests will decrease the amount of time that you wait before being seen by our physicians.       _____________________________________________________________  Should you have questions after your visit to [facility], please contact our office at [phone] between the hours of 8:00 a.m. and 4:30 p.m.  Voicemails left after 4:00 p.m. will not be returned until the following business day.  For prescription refill requests, have your pharmacy contact our office and allow 72 hours.    Cancer Center Support Programs:   > Cancer Support Group  2nd Tuesday of the month 1pm-2pm, Journey Room    

## 2019-05-02 NOTE — Progress Notes (Signed)
Patient Care Team: Sasser, Silvestre Moment, MD as PCP - General (Family Medicine) Gala Romney Cristopher Estimable, MD as Consulting Physician (Gastroenterology)  DIAGNOSIS:  Encounter Diagnosis  Name Primary?  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 Gala Romney, abnormal gastric mucosa. Pathology with EXTRANODAL Marginal zone lymphoma. NEGATIVE for H. Pylori   03/03/2016 Miscellaneous   H pylori stool antigen NEGATIVE   03/03/2016 PET scan   Focal area of hypermetabolism and wall thickening involving the body antral junction region of the stomach c/w history of lymphoma. 5.5 mm LLL pulm nodule not hypermetabolic. Non contrast chest CT in 6 month   03/18/2016 - 04/08/2016 Radiation Therapy   The gastric tumor received 30 Gy in 15 fractions of 2 Gy   Lung cancer ([place])  05/19/2018 Initial Diagnosis   Lung cancer ([place])   06/06/2018 Cancer Staging   Staging form: Lung, AJCC 8th Edition - Clinical: cT1b, cN0 - Signed by Zoila Shutter, MD on 06/06/2018     CHIEF COMPLIANT: Follow-up of gastric lymphoma and left lower lobe lung cancer.  INTERVAL HISTORY: Monica [name] is a 78 y.o. seen in follow-up visit for gastric lymphoma and lung cancer.  Denies any fevers, night sweats or weight loss.  She lives with her husband and independent of ADLs and IADLs.  Denies any recent upper respiratory or lower respiratory infections.  Denies any ER visits or hospitalizations.  Denies any bleeding per rectum or melena.  Denies any nausea vomiting or diarrhea.  REVIEW OF SYSTEMS:   Constitutional: Denies fevers, chills or abnormal weight loss Eyes: Denies blurriness of vision Ears, nose, mouth, throat, and face: Denies mucositis or sore throat Respiratory: Denies cough, dyspnea or wheezes Cardiovascular: Denies palpitation, chest discomfort Gastrointestinal:  Denies nausea, heartburn or change in bowel habits  Skin: Denies abnormal skin rashes Lymphatics: Denies new lymphadenopathy or easy bruising Neurological:Denies numbness, tingling or new weaknesses Behavioral/Psych: Mood is stable, no new changes  Extremities: No lower extremity edema All other systems were reviewed with the patient and are negative.  I have reviewed the past medical history, past surgical history, social history and family history with the patient and they are unchanged from previous note.  ALLERGIES:  is allergic to sulfur.  MEDICATIONS:  Current Outpatient Medications  Medication Sig Dispense Refill  . acetaminophen (TYLENOL) 325 MG tablet Take 650 mg by mouth every 6 (six) hours as needed for moderate pain or headache.    . citalopram (CELEXA) 20 MG tablet Take 20 mg by mouth daily.     . metFORMIN (GLUCOPHAGE-XR) 500 MG 24 hr tablet Take 500 mg by mouth daily.     . metoprolol tartrate (LOPRESSOR) 25 MG tablet Take 25 mg by mouth every evening.    . Multiple Vitamin (MULTIVITAMIN WITH MINERALS) TABS tablet Take 1 tablet by mouth daily.    . pravastatin (PRAVACHOL) 40 MG tablet Take 40 mg by mouth at bedtime.     . Polyvinyl Alcohol-Povidone PF (REFRESH) 1.4-0.6 % SOLN Place 1 drop into both eyes 2 (two) times daily.     No current facility-administered medications for this visit.     PHYSICAL EXAMINATION: ECOG PERFORMANCE STATUS: 1 - Symptomatic but completely ambulatory  Vitals:   05/02/19 1100  BP: (!) 152/67  Pulse: 70  Resp: 16  Temp: (!) 96.8 F (36 C)  SpO2: 97%  Filed Weights   05/02/19 1100  Weight: 177 lb 9 oz (80.5 kg)    GENERAL:alert, no distress and comfortable SKIN: skin color, texture, turgor are normal, no rashes or significant lesions EYES: normal, Conjunctiva are pink and non-injected, sclera clear OROPHARYNX:no mucositis, no erythema and lips, buccal mucosa, and tongue normal  NECK: supple, thyroid normal size, non-tender, without nodularity LYMPH:  no palpable lymphadenopathy  in the cervical, axillary or inguinal LUNGS: clear to auscultation and percussion with normal breathing effort HEART: regular rate & rhythm and no murmurs and no lower extremity edema ABDOMEN:abdomen soft, non-tender and normal bowel sounds MUSCULOSKELETAL:no cyanosis of digits and no clubbing  EXTREMITIES: No lower extremity edema   LABORATORY DATA:  I have reviewed the data as listed CMP Latest Ref Rng & Units 05/02/2019 10/09/2018 05/21/2018  Glucose 70 - 99 mg/dL 200(H) 225(H) 122(H)  BUN 8 - 23 mg/dL 12 9 8   Creatinine 0.44 - 1.00 mg/dL 0.84 0.79 0.70  Sodium 135 - 145 mmol/L 134(L) 135 139  Potassium 3.5 - 5.1 mmol/L 4.5 4.5 4.5  Chloride 98 - 111 mmol/L 100 100 103  CO2 22 - 32 mmol/L 26 24 29   Calcium 8.9 - 10.3 mg/dL 9.5 9.3 8.4(L)  Total Protein 6.5 - 8.1 g/dL 7.6 8.0 5.8(L)  Total Bilirubin 0.3 - 1.2 mg/dL 0.3 0.5 0.6  Alkaline Phos 38 - 126 U/L 69 59 44  AST 15 - 41 U/L 27 27 23   ALT 0 - 44 U/L 25 17 17    No results found for: [id number]   Lab Results  Component Value Date   WBC 7.0 05/02/2019   HGB 12.3 05/02/2019   HCT 38.5 05/02/2019   MCV 88.5 05/02/2019   PLT 253 05/02/2019   NEUTROABS 3.9 05/02/2019   Radiology: I have independently reviewed PET CT scan and reviewed the images with the patient.  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  H. pylori negative. -She underwent radiation therapy 30 Gy in 15 fractions from 03/18/2016 through 04/08/2016. - Last EGD and biopsy on 06/13/2018 shows mild chronic gastritis with small lymphoid aggregate with no features of lymphoma. - PET CT scan on 04/30/2019 did not show any hypermetabolic activity in the stomach region. -LDH and other labs are normal.  Does not have any B symptoms.  She reportedly was scheduled for EGD in March but rescheduled it secondary to [place]. -Physical exam  does not reveal any palpable adenopathy or splenomegaly.  2.  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 - We reviewed PET CT scan dated 04/30/2019.  New hypermetabolic focus in the right lower lobe associated with a 0.9 cm pulmonary nodule, maximum SUV 2.6.  On the prior exam in December 2019 this area was blurred by motion artifact and only manifested as equivocal nodularity along the bronchovascular branching.  Right infrahilar accentuated metabolic activity with maximum SUV 4.3 (previously maximum SUV 3.4) raising the possibility of right infrahilar adenopathy. -We have discussed further options including evaluation by Dr. Roxan Hockey versus wait and watch with repeat scan in 3 to 4 months.  She opted for repeat scan.  We will schedule her follow-up in 3 months with repeat PET CT scan.   Total time spent is 25 minutes with more than 50% of the time  spent face-to-face discussing PET scan new findings, treatment options, counseling and coordination of care.  Orders Placed This Encounter  Procedures  . NM PET Image Restag (PS) Skull Base To Thigh    Standing Status:   Future    Standing Expiration Date:   05/01/2020    Order Specific Question:   ** REASON FOR EXAM (FREE TEXT)    Answer:   follow up of right lower lobe nodule and right hilum    Order Specific Question:   If indicated for the ordered procedure, I authorize the administration of a radiopharmaceutical per Radiology protocol    Answer:   Yes    Order Specific Question:   Preferred imaging location?    Answer:   [facility]    Order Specific Question:   Radiology Contrast Protocol - do NOT remove file path    Answer:   [email or web address]   The patient has a good understanding of the overall plan. she agrees with it. she will call with any problems that may develop before the next visit  here.  Derek Jack, MD 05/02/19

## 2019-05-02 NOTE — Progress Notes (Signed)
For review.  Please update ordering provider

## 2019-05-18 ENCOUNTER — Other Ambulatory Visit (HOSPITAL_COMMUNITY): Payer: Self-pay | Admitting: Family Medicine

## 2019-05-18 DIAGNOSIS — Z1231 Encounter for screening mammogram for malignant neoplasm of breast: Secondary | ICD-10-CM

## 2019-05-24 ENCOUNTER — Other Ambulatory Visit: Payer: Self-pay

## 2019-05-24 ENCOUNTER — Ambulatory Visit (HOSPITAL_COMMUNITY)
Admission: RE | Admit: 2019-05-24 | Discharge: 2019-05-24 | Disposition: A | Discharge status: Home / Self Care | Payer: Medicare Other | Source: Ambulatory Visit | Attending: Family Medicine | Admitting: Family Medicine

## 2019-05-24 DIAGNOSIS — Z1231 Encounter for screening mammogram for malignant neoplasm of breast: Secondary | ICD-10-CM | POA: Insufficient documentation

## 2019-06-11 DIAGNOSIS — H35033 Hypertensive retinopathy, bilateral: Secondary | ICD-10-CM | POA: Diagnosis not present

## 2019-06-11 DIAGNOSIS — H26493 Other secondary cataract, bilateral: Secondary | ICD-10-CM | POA: Diagnosis not present

## 2019-06-11 DIAGNOSIS — H35013 Changes in retinal vascular appearance, bilateral: Secondary | ICD-10-CM | POA: Diagnosis not present

## 2019-06-11 DIAGNOSIS — E119 Type 2 diabetes mellitus without complications: Secondary | ICD-10-CM | POA: Diagnosis not present

## 2019-06-28 DIAGNOSIS — H26492 Other secondary cataract, left eye: Secondary | ICD-10-CM | POA: Diagnosis not present

## 2019-06-29 DIAGNOSIS — M79605 Pain in left leg: Secondary | ICD-10-CM | POA: Diagnosis not present

## 2019-06-29 DIAGNOSIS — M76829 Posterior tibial tendinitis, unspecified leg: Secondary | ICD-10-CM | POA: Diagnosis not present

## 2019-07-13 DIAGNOSIS — M79605 Pain in left leg: Secondary | ICD-10-CM | POA: Diagnosis not present

## 2019-07-13 DIAGNOSIS — M76829 Posterior tibial tendinitis, unspecified leg: Secondary | ICD-10-CM | POA: Diagnosis not present

## 2019-07-16 ENCOUNTER — Telehealth: Payer: Self-pay | Admitting: Cardiology

## 2019-07-16 NOTE — Telephone Encounter (Signed)
  Patient is calling because she wants her husband to come with her to her appt on 07/19/19.

## 2019-07-16 NOTE — Telephone Encounter (Signed)
Will forward this message to Dr. Marlou Porch covering RN for further review and follow-up with the pt.

## 2019-07-17 NOTE — Telephone Encounter (Signed)
Spoke with patient who is aware we are not allowing visitors at appt unless absolutely necessary.  She reports she does not have a reason for him to attend with her.  Advised to bring her cell phone and she can call him and put him on speaker phone during the appt.  She expressed gratitude for that suggestion and states understanding of husband not being able to come to the appt.

## 2019-07-18 DIAGNOSIS — N3020 Other chronic cystitis without hematuria: Secondary | ICD-10-CM | POA: Diagnosis not present

## 2019-07-19 ENCOUNTER — Other Ambulatory Visit: Payer: Self-pay

## 2019-07-19 ENCOUNTER — Encounter: Payer: Self-pay | Admitting: Cardiology

## 2019-07-19 ENCOUNTER — Ambulatory Visit (INDEPENDENT_AMBULATORY_CARE_PROVIDER_SITE_OTHER): Payer: Medicare Other | Admitting: Cardiology

## 2019-07-19 VITALS — BP 144/62 | HR 75 | Ht 66.0 in | Wt 180.0 lb

## 2019-07-19 DIAGNOSIS — I480 Paroxysmal atrial fibrillation: Secondary | ICD-10-CM | POA: Diagnosis not present

## 2019-07-19 DIAGNOSIS — Z720 Tobacco use: Secondary | ICD-10-CM

## 2019-07-19 DIAGNOSIS — I700 Atherosclerosis of aorta: Secondary | ICD-10-CM

## 2019-07-19 DIAGNOSIS — E1169 Type 2 diabetes mellitus with other specified complication: Secondary | ICD-10-CM

## 2019-07-19 DIAGNOSIS — E785 Hyperlipidemia, unspecified: Secondary | ICD-10-CM | POA: Diagnosis not present

## 2019-07-19 NOTE — Progress Notes (Signed)
hear

## 2019-07-19 NOTE — Patient Instructions (Signed)
Medication Instructions:  The current medical regimen is effective;  continue present plan and medications.  If you need a refill on your cardiac medications before your next appointment, please call your pharmacy.   Follow-Up: Follow up as needed with Dr Marlou Porch.  Thank you for choosing [facility]!!

## 2019-07-19 NOTE — Progress Notes (Signed)
Cardiology Office Note:    Date:  07/19/2019   ID:  Monica [name], DOB [birth year], MRN [id number]  PCP:  Manon Hilding, MD  Cardiologist:  No primary care provider on file.   Referring MD: Manon Hilding, MD     History of Present Illness:    Monica [name] is a 78 y.o. female who underwent left lower lobe resection on 05/19/2018 for a stage Ia squamous cell carcinoma who had atrial fibrillation postoperatively and was started on amiodarone at that time seen in consultation by me in the hospital setting here for follow-up.  I have reviewed Dr. Leonarda Salon last note from 06/13/2018 and she was doing fairly well except for complaint of nausea.  She says food does not taste well.  She thought it was may be due to an antibiotic.  Because of this complaint, Dr. Roxan Hockey stop the amiodarone as the potential cause for her nausea.  She was in sinus rhythm about 1 month out of surgery.  She has a history of prior tobacco use, gastric MALT lymphoma, type 2 diabetes, Schatzki's ring, hiatal hernia, interstitial cystitis, depression.  She denies any anginal-like symptoms, she is having some shortness of breath which is typical after surgery, no further nausea, no syncope, no bleeding.  Chest wall site is uncomfortable.  To be expected.  07/19/2019-here for the follow-up of right bundle branch block.  Overall doing quite well. Left foot tendon repair. Rare flutter in the evening, relaxing. Family Doc PA heard ectopy.  Today she does have sinus rhythm on her EKG.  No changes.  We have not documented any return of atrial fibrillation.  This is good.  Denies any fevers chills nausea vomiting syncope.  She does take metoprolol at night.  Past Medical History:  Diagnosis Date  . Cancer (HCC)    NHL, Malt Lymphoma  . Depression   . DM (diabetes mellitus) ([place])    TYPE  2  . Dysphagia   . Dysrhythmia    History of palpatations  . GERD (gastroesophageal reflux disease)    INGESTION   . Heart  palpitations   . Hiatal hernia   . Hyperlipidemia   . Interstitial cystitis   . Lung cancer, lower lobe ([place]) 07/21/2018   Left lower lobe, stage Ia squamous cell carcinoma  . 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 Sinusitis     Past Surgical History:  Procedure Laterality Date  . ABDOMINAL HYSTERECTOMY    . BIOPSY  09/01/2016   Procedure: BIOPSY;  Surgeon: Daneil Dolin, MD;  [facility];  Service: Endoscopy;;  gastric  . BIOPSY  11/29/2018   Procedure: BIOPSY;  Surgeon: Daneil Dolin, MD;  [facility];  Service: Endoscopy;;  right colon  . BLADDER SURGERY    . CATARACT EXTRACTION, BILATERAL    . COLONOSCOPY     Dr. Lindalou Hose 2009: Normal per PCP notes  . COLONOSCOPY N/A 11/29/2018   Procedure: COLONOSCOPY;  Surgeon: Daneil Dolin, MD;  [facility];  Service: Endoscopy;  Laterality: N/A;  7:30am  . ESOPHAGOGASTRODUODENOSCOPY     RMR: Prominant Schzgzki ring/component of peptic stricture status post dilation and disruption as described above, otherwise norma esophagus, moderate-sized hiatal hernia, antal pyloric channel, and posterier bulbar erosions, otherwise unremarkable stomach, D1 and D2 . Inflammatory findings on the stomach and duodenum will likely be related to aspirin effect. We need to rule out Helicobacter pylor  . ESOPHAGOGASTRODUODENOSCOPY  N/A 03/10/2015   Dr. 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 Pathology with lymphoid population of stomach, slight atypia  . ESOPHAGOGASTRODUODENOSCOPY N/A 02/03/2016   Dr. Gala Romney: Schatzki ring noted at GE junction, dilated with 61 and then 47 [place] dilator. Large hiatal hernia. 6 x 7 cm nodular geographically ulcerated mucosa, biopsy c/w MALToma  . ESOPHAGOGASTRODUODENOSCOPY N/A 04/08/2016   Dr. Gala Romney: moderate Schatzki's ring/web s/p dilation, large hiatal hernia, localized area of gastric lymphoma  visualized and appeared to be much improved. normal second portion of the duodenum. No specimens collected. Esophageal lumen notably tighted up significantly since her dilation in April of this year.   . ESOPHAGOGASTRODUODENOSCOPY N/A 08/04/2016   Procedure: ESOPHAGOGASTRODUODENOSCOPY (EGD);  Surgeon: Daneil Dolin, MD;  [facility];  Service: Endoscopy;  Laterality: N/A;  7:30 am  . ESOPHAGOGASTRODUODENOSCOPY N/A 09/01/2016   Procedure: ESOPHAGOGASTRODUODENOSCOPY (EGD);  Surgeon: Daneil Dolin, MD;  [facility];  Service: Endoscopy;  Laterality: N/A;  830   . ESOPHAGOGASTRODUODENOSCOPY N/A 05/10/2017   Dr. Gala Romney: Moderate Schatzki ring at the GE junction, status post dilation with 56 [place].  Medium sized hiatal hernia.  Few localized erosions in the gastric antrum.  Stomach biopsy showed chronic gastritis, no H. pylori.  No atypical lymphoid infiltrates or other features of lymphoproliferative process  . ESOPHAGOGASTRODUODENOSCOPY N/A 06/13/2018   Dr. Gala Romney: Schatzki ring status post dilation.  Large hiatal hernia.  Focal area 4 x 4 cm along the greater curvature, somewhat erythematous and thickened mucosa, biopsy benign.  Next EGD in August 2020.  Marland Kitchen LOBECTOMY Left 05/19/2018   Procedure: LEFT LOWER LOBECTOMY;  Surgeon: [name], MD;  Location: [facility];  Service: Thoracic;  Laterality: Left;  Marland Kitchen MALONEY DILATION N/A 03/10/2015   Procedure: Venia Minks DILATION;  Surgeon: Daneil Dolin, MD;  [facility];  Service: Endoscopy;  Laterality: N/A;  . Venia Minks DILATION N/A 02/03/2016   Procedure: Venia Minks DILATION;  Surgeon: Daneil Dolin, MD;  [facility];  Service: Endoscopy;  Laterality: N/A;  . Venia Minks DILATION N/A 04/08/2016   Procedure: Venia Minks DILATION;  Surgeon: Daneil Dolin, MD;  [facility];  Service: Endoscopy;  Laterality: N/A;  . Venia Minks DILATION N/A 05/10/2017   Procedure: Venia Minks DILATION;  Surgeon: Daneil Dolin, MD;  [facility];  Service: Endoscopy;  Laterality: N/A;  . Venia Minks DILATION N/A 06/13/2018   Procedure: Venia Minks DILATION;  Surgeon: Daneil Dolin, MD;  [facility];  Service: Endoscopy;  Laterality: N/A;  . POLYPECTOMY  11/29/2018   Procedure: POLYPECTOMY;  Surgeon: Daneil Dolin, MD;  [facility];  Service: Endoscopy;;  . VIDEO ASSISTED THORACOSCOPY (VATS)/WEDGE RESECTION Left 05/19/2018   Procedure: VIDEO ASSISTED THORACOSCOPY (VATS)/WEDGE RESECTION;  Surgeon: [name], MD;  Location: [facility];  Service: Thoracic;  Laterality: Left;    Current Medications: No outpatient medications have been marked as taking for the 07/19/19 encounter (Office Visit) with Jerline Pain, MD.     Allergies:   Sulfur   Social History   Socioeconomic History  . Marital status: Married    Spouse name: Not on file  . Number of children: Not on file  . Years of education: Not on file  . Highest education level: Not on file  Occupational History  . Not on file  Social Needs  . Financial resource strain: Not on file  . Food insecurity  Worry: Not on file    Inability: Not on file  . Transportation needs    Medical: Not on file    Non-medical: Not on file  Tobacco Use  . Smoking status: Former Smoker    Packs/day: 0.75    Years: 57.00    Pack years: 42.75    Types: Cigarettes    Quit date: 05/23/2018    Years since quitting: 1.1  . Smokeless tobacco: Never Used  . Tobacco comment: trying to quit, wearing patch  Substance and Sexual Activity  . Alcohol use: No    Alcohol/week: 0.0 standard drinks  . Drug use: No  . Sexual activity: Not on file  Lifestyle  . Physical activity    Days per week: Not on file    Minutes per session: Not on file  . Stress: Not on file  Relationships  . Social Herbalist on phone: Not on file    Gets together: Not on file    Attends religious service: Not on file    Active member of club or organization: Not on file     Attends meetings of clubs or organizations: Not on file    Relationship status: Not on file  Other Topics Concern  . Not on file  Social History Narrative  . Not on file     Family History: The patient's family history includes Dementia in her father; Diabetes in her brother, sister, and sister; Heart attack in her father. There is no history of Colon cancer.  ROS:   Please see the history of present illness.     All other systems reviewed and are negative.  EKGs/Labs/Other Studies Reviewed:    The following studies were reviewed today: CT scan-aortic atherosclerosis.  Prior office notes, lab work  ECHO 2019: - Left ventricle: The cavity size was normal. Wall thickness was   increased in a pattern of moderate LVH. Systolic function was   vigorous. The estimated ejection fraction was in the range of 65%   to 70%. Wall motion was normal; there were no regional wall   motion abnormalities. Features are consistent with a pseudonormal   left ventricular filling pattern, with concomitant abnormal   relaxation and increased filling pressure (grade 2 diastolic   dysfunction). - Aortic valve: Mildly calcified annulus. Trileaflet. - Mitral valve: There was mild regurgitation. - Left atrium: The atrium was moderately dilated. - Right atrium: Central venous pressure (est): 3 mm Hg. - Atrial septum: No defect or patent foramen ovale was identified. - Tricuspid valve: There was trivial regurgitation. - Pulmonary arteries: Systolic pressure could not be accurately   estimated. - Pericardium, extracardiac: There was no pericardial effusion.  EKG: 07/19/2019-sinus rhythm right bundle branch block 75 no changes from prior 05/21/2018-sinus rhythm right bundle branch block personally viewed.  Recent Labs: 05/02/2019: ALT 25; BUN 12; Creatinine, Ser 0.84; Hemoglobin 12.3; Platelets 253; Potassium 4.5; Sodium 134  Recent Lipid Panel No results found for: CHOL, TRIG, HDL, CHOLHDL, VLDL, LDLCALC,  LDLDIRECT  Physical Exam:    VS:  BP (!) 144/62   Pulse 75   Ht 5\' 6"  (1.676 m)   Wt 180 lb (81.6 kg)   BMI 29.05 kg/m     Wt Readings from Last 3 Encounters:  07/19/19 180 lb (81.6 kg)  05/02/19 177 lb 9 oz (80.5 kg)  11/29/18 164 lb (74.4 kg)     GEN: Well nourished, well developed, in no acute distress  HEENT: normal  Neck: no  JVD, carotid bruits, or masses Cardiac: RRR; no murmurs, rubs, or gallops,no edema  Respiratory:  clear to auscultation bilaterally, normal work of breathing GI: soft, nontender, nondistended, + BS MS: no deformity or atrophy  Skin: warm and dry, no rash Neuro:  Alert and Oriented x 3, Strength and sensation are intact Psych: euthymic mood, full affect   ASSESSMENT:    1. Paroxysmal atrial fibrillation (HCC)   2. Type 2 diabetes mellitus with hyperlipidemia ([place])   3. Aortic atherosclerosis ([place])   4. Tobacco use    PLAN:    In order of problems listed above:  RBBB -Stable, no changes on EKG.  No syncope.  Paroxysmal atrial fibrillation, postoperative atrial fibrillation status post left lower lobe ectomy - Amiodarone was stopped on 06/13/2018 after complaints of nausea.  She was in sinus rhythm at the time.  Excellent echocardiogram.  She has been maintaining sinus rhythm.  I told her that her occasional butterfly/flutter type feeling at night that is fleeting is most likely a PVC or PAC phenomenon.  Obviously if she begins to feel these continuous for several minutes hours duration, she will let me know and we will place a ZIO patch monitor to further evaluate.  She does have a history of postoperative atrial fibrillation as above.  No documentation of this currently.  Squamous cell carcinoma - Resection on 05/19/2018  Tobacco use - Back to smoking. Stress.  Encouraged her stop.  Aortic atherosclerosis -Seen on prior imaging.  Continue with secondary prevention.  On pravastatin.  LDL 80 on 03/23/2019  Diabetes -Hemoglobin A1c 7. 6 on  03/24/2019 continue to work with primary physician for good overall glucose control.  On metformin.    We will see back as needed.  Please let us know if we can be of further assistance.  Medication Adjustments/Labs and Tests Ordered: Current medicines are reviewed at length with the patient today.  Concerns regarding medicines are outlined above.  Orders Placed This Encounter  Procedures  . EKG 12-Lead   No orders of the defined types were placed in this encounter.   Patient Instructions  Medication Instructions:  The current medical regimen is effective;  continue present plan and medications.  If you need a refill on your cardiac medications before your next appointment, please call your pharmacy.   Follow-Up: Follow up as needed with Dr Marlou Porch.  Thank you for choosing [facility]!!         Signed, Candee Furbish, MD  07/19/2019 9:12 AM    [facility]

## 2019-07-20 IMAGING — PT NM PET TUM IMG RESTAG (PS) SKULL BASE T - THIGH
3 series · 21 of 25 positions shown · non-contrast
Comparison: 03/03/2016 PET-CT. 09/19/2017 CT abdomen/pelvis.
03/02/2017 chest CT.

CLINICAL DATA: Subsequent treatment strategy for gastric MALT
lymphoma.

EXAM:
NUCLEAR MEDICINE PET SKULL BASE TO THIGH
TECHNIQUE: 11.9 mCi F-18 FDG was injected intravenously. Full-ring PET imaging
was performed from the skull base to thigh after the radiotracer. CT
data was obtained and used for attenuation correction and anatomic
localization.
Fasting blood glucose: 104 mg/dl

[axial ct wb fusion · 14 of 176 slices shown]
[im 1/176]
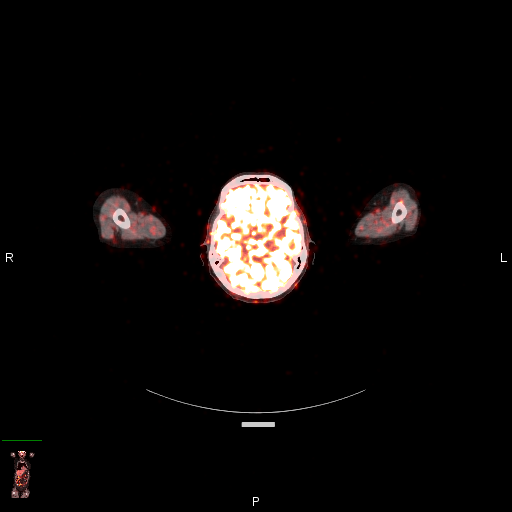
[im 11/176]
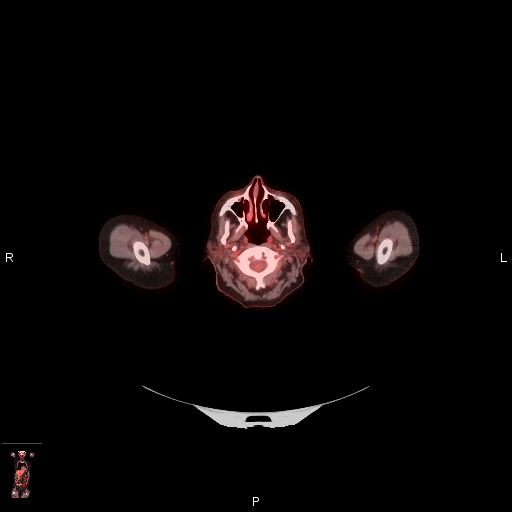
[im 22/176]
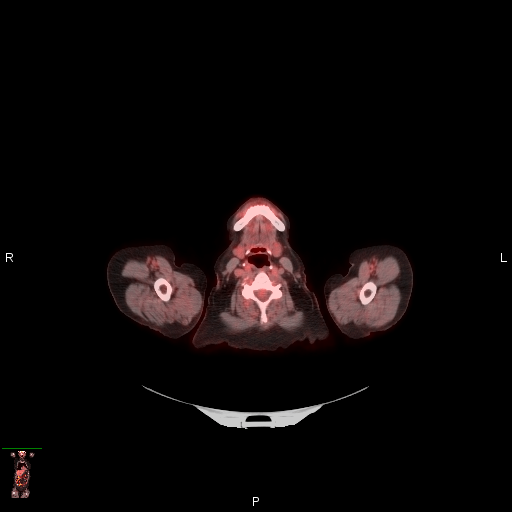
[im 44/176]
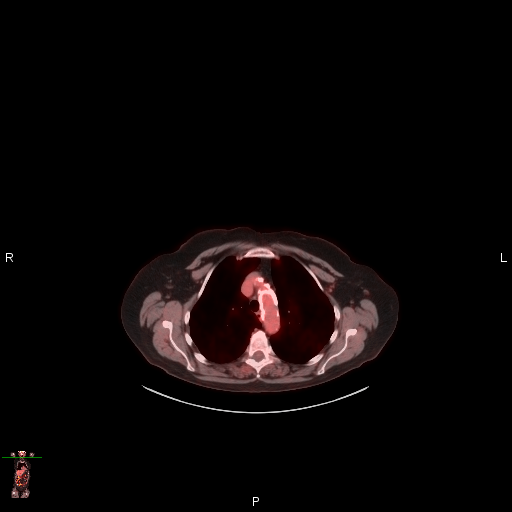
[im 55/176]
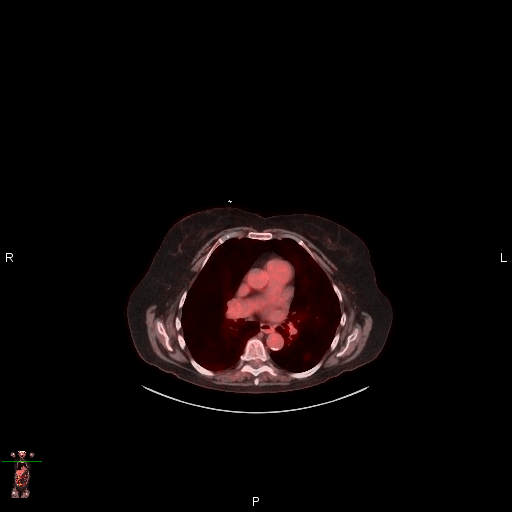
[im 66/176]
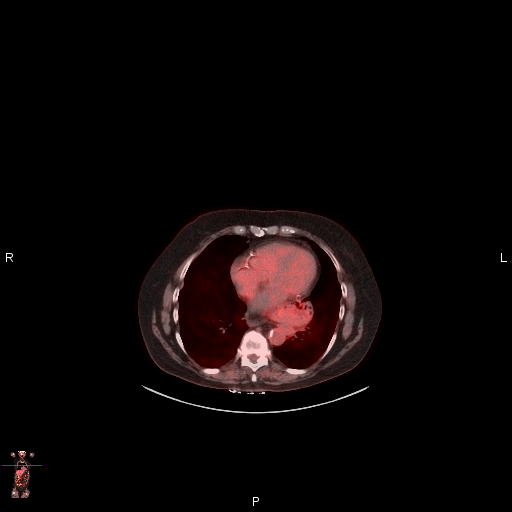
[im 77/176]
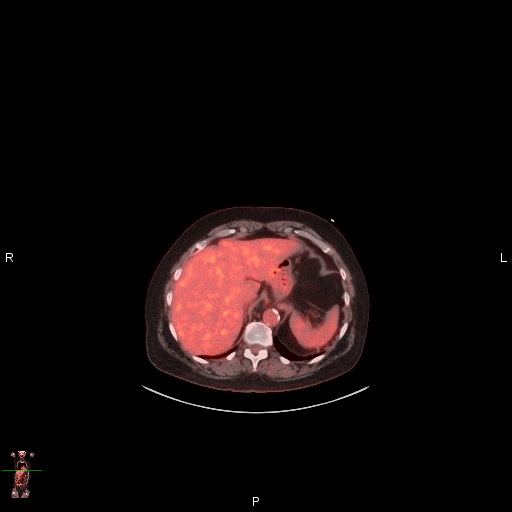
[im 88/176]
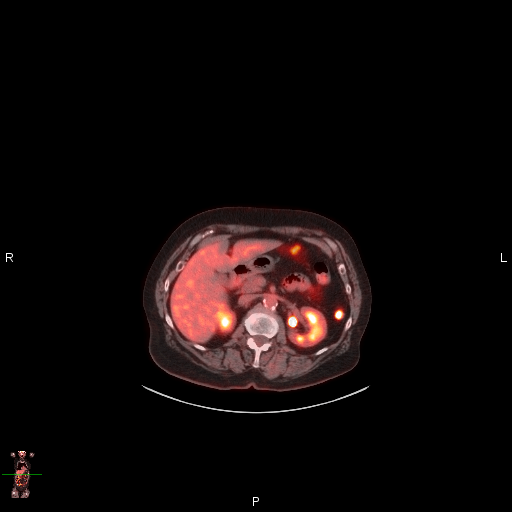
[im 110/176]
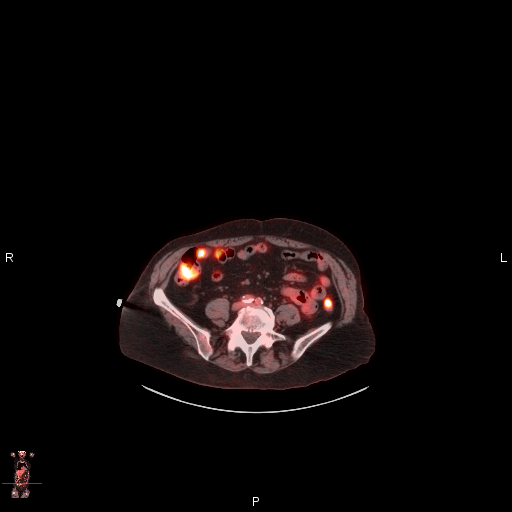
[im 121/176]
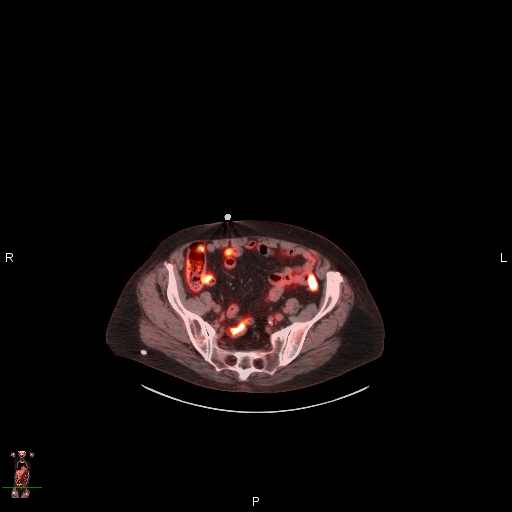
[im 132/176]
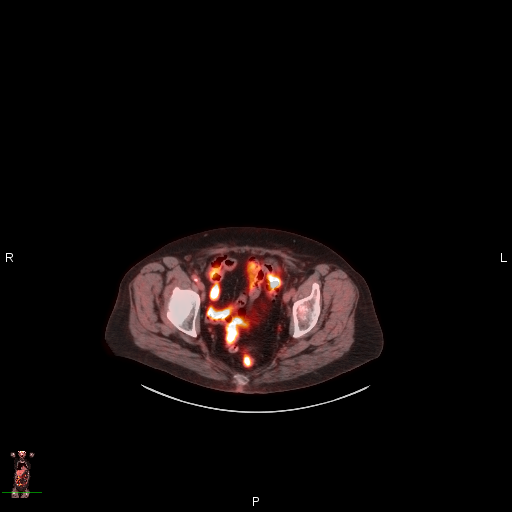
[im 143/176]
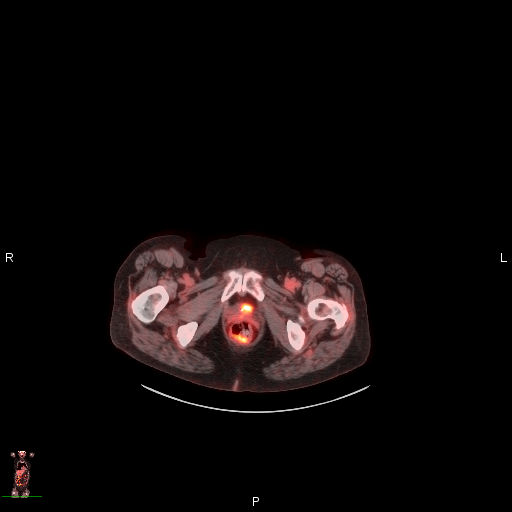
[im 154/176]
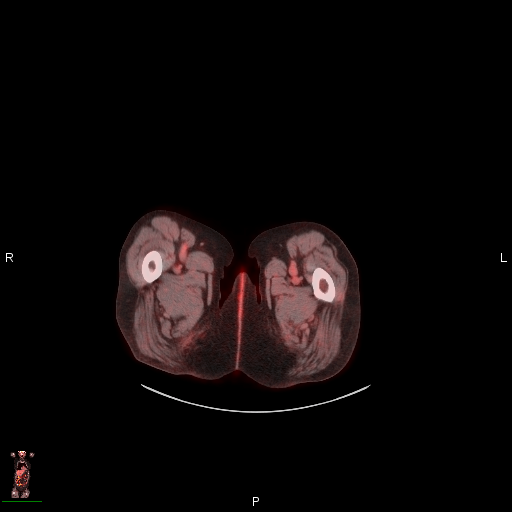
[im 176/176]
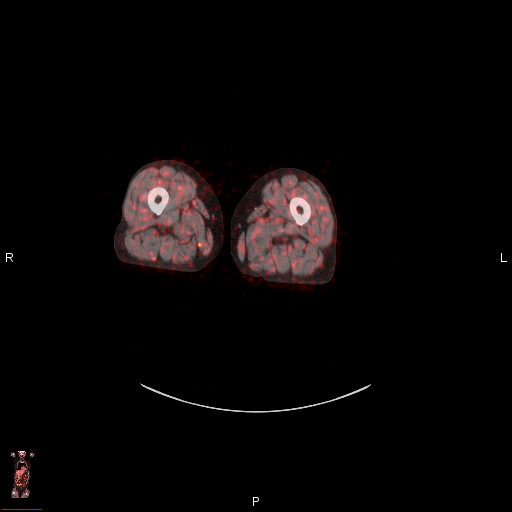

[mip · 4 of 48 slices shown]
[im 1/48]
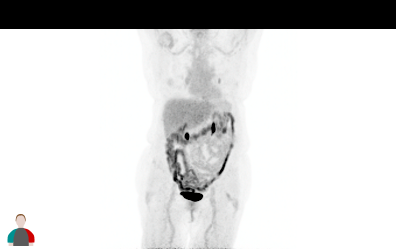
[im 16/48]
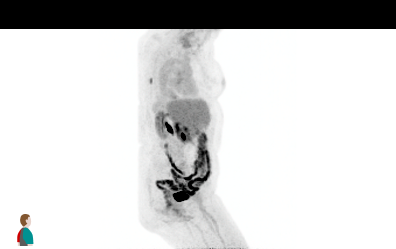
[im 32/48]
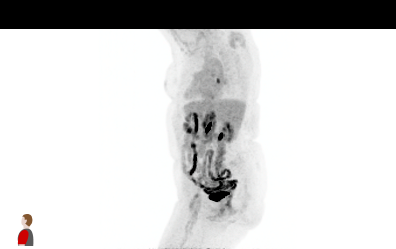
[im 48/48]
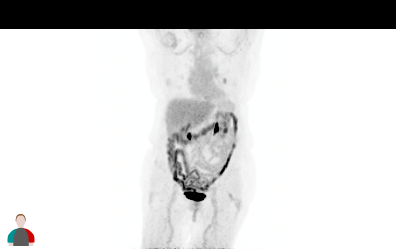

[coronal ct wb fusion · 3 of 48 slices shown]
[im 16/48]
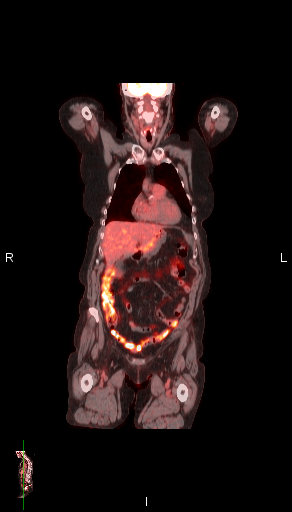
[im 32/48]
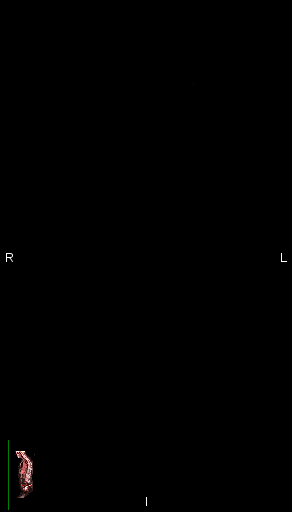
[im 48/48]
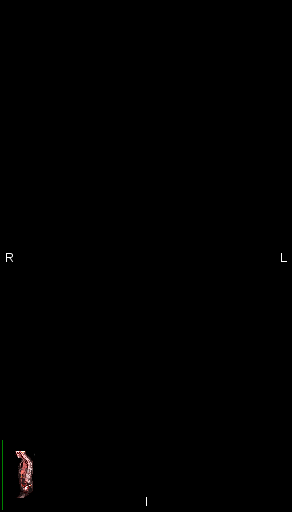

[21 of 25 positions shown; findings below may reference images not displayed]

FINDINGS: Mediastinal blood pool activity: SUV max

NECK: No hypermetabolic lymph nodes in the neck.

Incidental CT findings: none

CHEST:

Hypermetabolic solid 1.4 cm left lower lobe pulmonary nodule with
max SUV 4.6 (series 3/image 118), new since 03/03/2016 PET-CT and
new since 03/02/2017 chest CT. No additional hypermetabolic
pulmonary findings. No hypermetabolic hilar, mediastinal or axillary
lymphadenopathy.

Incidental CT findings: Three-vessel coronary atherosclerosis.
Atherosclerotic nonaneurysmal thoracic aorta. Moderate to severe
centrilobular emphysema with diffuse bronchial wall thickening.
Scattered subpleural solid left lower lobe pulmonary nodules
measuring up to 5 mm peripherally (series 3/image 123) are stable
since 03/02/2017 chest CT and considered benign. No acute
consolidative airspace disease or additional new significant
pulmonary nodules. Compressive atelectasis in the medial left lower
lobe.

ABDOMEN/PELVIS: No abnormal hypermetabolic activity within the
liver, pancreas, adrenal glands, or spleen. No hypermetabolic lymph
nodes in the abdomen or pelvis.

There is mild hypermetabolism (max SUV 4.1) in the posterior antral
region of the stomach without discrete gastric mass or definite
gastric wall thickening on CT images.

Incidental CT findings: Simple 3.4 cm lower right renal cyst.
Atherosclerotic nonaneurysmal abdominal aorta. Moderate to large
hiatal hernia. Marked sigmoid diverticulosis. Hysterectomy.

SKELETON: No focal hypermetabolic activity to suggest skeletal
metastasis.

Incidental CT findings: none
IMPRESSION: 1. Hypermetabolic solid 1.4 cm left lower lobe pulmonary nodule,
new, suspicious for primary bronchogenic carcinoma given the
advanced smoking related changes in the lungs.
2. No hypermetabolic thoracic adenopathy or distant metastatic
disease.
3. Mild hypermetabolism in the antral region of the stomach without
CT correlate, nonspecific, which could be due to peristalsis,
inflammatory change or early recurrent tumor.
4. Chronic findings include: Aortic Atherosclerosis (BBIUI-FSR.R)
and Emphysema (BBIUI-40O.D). Marked sigmoid diverticulosis. Moderate
to large hiatal hernia. Three-vessel coronary atherosclerosis.

## 2019-07-23 DIAGNOSIS — I10 Essential (primary) hypertension: Secondary | ICD-10-CM | POA: Diagnosis not present

## 2019-07-23 DIAGNOSIS — E1165 Type 2 diabetes mellitus with hyperglycemia: Secondary | ICD-10-CM | POA: Diagnosis not present

## 2019-07-23 DIAGNOSIS — E782 Mixed hyperlipidemia: Secondary | ICD-10-CM | POA: Diagnosis not present

## 2019-07-23 DIAGNOSIS — F411 Generalized anxiety disorder: Secondary | ICD-10-CM | POA: Diagnosis not present

## 2019-07-23 DIAGNOSIS — F17200 Nicotine dependence, unspecified, uncomplicated: Secondary | ICD-10-CM | POA: Diagnosis not present

## 2019-07-23 DIAGNOSIS — Z720 Tobacco use: Secondary | ICD-10-CM | POA: Diagnosis not present

## 2019-07-26 DIAGNOSIS — E1165 Type 2 diabetes mellitus with hyperglycemia: Secondary | ICD-10-CM | POA: Diagnosis not present

## 2019-07-26 DIAGNOSIS — R05 Cough: Secondary | ICD-10-CM | POA: Diagnosis not present

## 2019-07-26 DIAGNOSIS — C3490 Malignant neoplasm of unspecified part of unspecified bronchus or lung: Secondary | ICD-10-CM | POA: Diagnosis not present

## 2019-07-26 DIAGNOSIS — M25472 Effusion, left ankle: Secondary | ICD-10-CM | POA: Diagnosis not present

## 2019-07-26 DIAGNOSIS — C884 Extranodal marginal zone B-cell lymphoma of mucosa-associated lymphoid tissue [MALT-lymphoma]: Secondary | ICD-10-CM | POA: Diagnosis not present

## 2019-07-26 DIAGNOSIS — I10 Essential (primary) hypertension: Secondary | ICD-10-CM | POA: Diagnosis not present

## 2019-07-26 DIAGNOSIS — Z23 Encounter for immunization: Secondary | ICD-10-CM | POA: Diagnosis not present

## 2019-07-26 DIAGNOSIS — R233 Spontaneous ecchymoses: Secondary | ICD-10-CM | POA: Diagnosis not present

## 2019-07-27 DIAGNOSIS — M79605 Pain in left leg: Secondary | ICD-10-CM | POA: Diagnosis not present

## 2019-07-27 DIAGNOSIS — M76829 Posterior tibial tendinitis, unspecified leg: Secondary | ICD-10-CM | POA: Diagnosis not present

## 2019-08-10 ENCOUNTER — Other Ambulatory Visit (HOSPITAL_COMMUNITY): Payer: Self-pay

## 2019-08-10 DIAGNOSIS — C884 Extranodal marginal zone B-cell lymphoma of mucosa-associated lymphoid tissue [MALT-lymphoma]: Secondary | ICD-10-CM

## 2019-08-10 DIAGNOSIS — C3432 Malignant neoplasm of lower lobe, left bronchus or lung: Secondary | ICD-10-CM

## 2019-08-13 ENCOUNTER — Ambulatory Visit (HOSPITAL_COMMUNITY)
Admission: RE | Admit: 2019-08-13 | Discharge: 2019-08-13 | Disposition: A | Discharge status: Home / Self Care | Payer: Medicare Other | Source: Ambulatory Visit | Attending: Hematology | Admitting: Hematology

## 2019-08-13 ENCOUNTER — Other Ambulatory Visit: Payer: Self-pay

## 2019-08-13 ENCOUNTER — Inpatient Hospital Stay (HOSPITAL_COMMUNITY): Payer: Medicare Other | Attending: Hematology

## 2019-08-13 DIAGNOSIS — C3490 Malignant neoplasm of unspecified part of unspecified bronchus or lung: Secondary | ICD-10-CM | POA: Diagnosis not present

## 2019-08-13 DIAGNOSIS — C3432 Malignant neoplasm of lower lobe, left bronchus or lung: Secondary | ICD-10-CM | POA: Diagnosis not present

## 2019-08-13 DIAGNOSIS — C884 Extranodal marginal zone B-cell lymphoma of mucosa-associated lymphoid tissue [MALT-lymphoma]: Secondary | ICD-10-CM | POA: Diagnosis not present

## 2019-08-13 DIAGNOSIS — Z902 Acquired absence of lung [part of]: Secondary | ICD-10-CM | POA: Diagnosis not present

## 2019-08-13 DIAGNOSIS — E119 Type 2 diabetes mellitus without complications: Secondary | ICD-10-CM | POA: Diagnosis not present

## 2019-08-13 DIAGNOSIS — Z87891 Personal history of nicotine dependence: Secondary | ICD-10-CM | POA: Diagnosis not present

## 2019-08-13 LAB — CBC WITH DIFFERENTIAL/PLATELET
Abs Immature Granulocytes: 0.01 10*3/uL (ref 0.00–0.07)
Basophils Absolute: 0.1 10*3/uL (ref 0.0–0.1)
Basophils Relative: 1 %
Eosinophils Absolute: 0.1 10*3/uL (ref 0.0–0.5)
Eosinophils Relative: 1 %
HCT: 42.6 % (ref 36.0–46.0)
Hemoglobin: 13.4 g/dL (ref 12.0–15.0)
Immature Granulocytes: 0 %
Lymphocytes Relative: 33 %
Lymphs Abs: 2.1 10*3/uL (ref 0.7–4.0)
MCH: 28.5 pg (ref 26.0–34.0)
MCHC: 31.5 g/dL (ref 30.0–36.0)
MCV: 90.4 fL (ref 80.0–100.0)
Monocytes Absolute: 0.6 10*3/uL (ref 0.1–1.0)
Monocytes Relative: 9 %
Neutro Abs: 3.5 10*3/uL (ref 1.7–7.7)
Neutrophils Relative %: 56 %
Platelets: 296 10*3/uL (ref 150–400)
RBC: 4.71 MIL/uL (ref 3.87–5.11)
RDW: 14.6 % (ref 11.5–15.5)
WBC: 6.3 10*3/uL (ref 4.0–10.5)
nRBC: 0 % (ref 0.0–0.2)

## 2019-08-13 LAB — COMPREHENSIVE METABOLIC PANEL
ALT: 22 U/L (ref 0–44)
AST: 30 U/L (ref 15–41)
Albumin: 4.1 g/dL (ref 3.5–5.0)
Alkaline Phosphatase: 65 U/L (ref 38–126)
Anion gap: 10 (ref 5–15)
BUN: 15 mg/dL (ref 8–23)
CO2: 25 mmol/L (ref 22–32)
Calcium: 9.5 mg/dL (ref 8.9–10.3)
Chloride: 99 mmol/L (ref 98–111)
Creatinine, Ser: 0.83 mg/dL (ref 0.44–1.00)
GFR calc Af Amer: >60 mL/min (ref >60)
GFR calc non Af Amer: >60 mL/min (ref >60)
Glucose, Bld: 150 mg/dL — ABNORMAL HIGH (ref 70–99)
Potassium: 4.6 mmol/L (ref 3.5–5.1)
Sodium: 134 mmol/L — ABNORMAL LOW (ref 135–145)
Total Bilirubin: 0.4 mg/dL (ref 0.3–1.2)
Total Protein: 7.9 g/dL (ref 6.5–8.1)

## 2019-08-13 LAB — LACTATE DEHYDROGENASE: LDH: 123 U/L (ref 98–192)

## 2019-08-13 MED ORDER — FLUDEOXYGLUCOSE F - 18 (FDG) INJECTION
11.9800 | Freq: Once | INTRAVENOUS | Status: AC | PRN
  Administered 2019-08-13: 14:00:00 11.98 via INTRAVENOUS

## 2019-08-15 ENCOUNTER — Encounter (HOSPITAL_COMMUNITY): Payer: Self-pay | Admitting: Hematology

## 2019-08-15 ENCOUNTER — Inpatient Hospital Stay (HOSPITAL_BASED_OUTPATIENT_CLINIC_OR_DEPARTMENT_OTHER): Payer: Medicare Other | Admitting: Hematology

## 2019-08-15 ENCOUNTER — Other Ambulatory Visit (HOSPITAL_COMMUNITY): Payer: Self-pay | Admitting: *Deleted

## 2019-08-15 ENCOUNTER — Other Ambulatory Visit: Payer: Self-pay

## 2019-08-15 VITALS — BP 142/66 | HR 68 | Temp 97.1°F | Resp 16 | Wt 174.8 lb

## 2019-08-15 DIAGNOSIS — C884 Extranodal marginal zone b-cell lymphoma of mucosa-associated lymphoid tissue (malt-lymphoma) not having achieved remission: Secondary | ICD-10-CM

## 2019-08-15 DIAGNOSIS — C3432 Malignant neoplasm of lower lobe, left bronchus or lung: Secondary | ICD-10-CM

## 2019-08-15 DIAGNOSIS — Z902 Acquired absence of lung [part of]: Secondary | ICD-10-CM | POA: Diagnosis not present

## 2019-08-15 DIAGNOSIS — E119 Type 2 diabetes mellitus without complications: Secondary | ICD-10-CM | POA: Diagnosis not present

## 2019-08-15 DIAGNOSIS — Z87891 Personal history of nicotine dependence: Secondary | ICD-10-CM | POA: Diagnosis not present

## 2019-08-15 NOTE — Progress Notes (Signed)
[place], [place] [address]   CLINIC:  Medical Oncology/Hematology  PCP:  Manon Hilding, MD [place] [address] [phone]   REASON FOR VISIT:  Follow-up for lung nodule, lung cancer and marginal zone lymphoma.  CURRENT THERAPY: Active surveillance.  BRIEF ONCOLOGIC HISTORY:  Oncology History  MALToma ([place])  03/21/2015 Miscellaneous   H Pylori IgG NEGATIVE   02/03/2016 Procedure   EGD Dr. Gala Romney, abnormal gastric mucosa. Pathology with EXTRANODAL Marginal zone lymphoma. NEGATIVE for H. Pylori   03/03/2016 Miscellaneous   H pylori stool antigen NEGATIVE   03/03/2016 PET scan   Focal area of hypermetabolism and wall thickening involving the body antral junction region of the stomach c/w history of lymphoma. 5.5 mm LLL pulm nodule not hypermetabolic. Non contrast chest CT in 6 month   03/18/2016 - 04/08/2016 Radiation Therapy   The gastric tumor received 30 Gy in 15 fractions of 2 Gy   Lung cancer ([place])  05/19/2018 Initial Diagnosis   Lung cancer ([place])   06/06/2018 Cancer Staging   Staging form: Lung, AJCC 8th Edition - Clinical: cT1b, cN0 - Signed by Zoila Shutter, MD on 06/06/2018      CANCER STAGING: Cancer Staging Lung cancer ([place]) Staging form: Lung, AJCC 8th Edition - Clinical: cT1b, cN0 - Signed by Zoila Shutter, MD on 06/06/2018  MALToma [facility]) Staging form: Lymphoid Neoplasms, AJCC 6th Edition - Clinical stage from 03/23/2016: Stage I - Signed by Baird Cancer, PA-C on 03/23/2016    INTERVAL HISTORY:  Monica [name] 78 y.o. female seen for follow-up of right lower lobe lung nodule in the setting of left lung squamous cell carcinoma.  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  Denies any fevers, night sweats or weight loss in the last few months.  Appetite is 100%.  Energy levels are 50%.  Denies any nausea, vomiting, stomach fullness.  No change in bowel habits.  No bleeding per rectum or melena.   No recent hospitalizations or infections.    REVIEW OF SYSTEMS:  Review of Systems  All other systems reviewed and are negative.    PAST MEDICAL/SURGICAL HISTORY:  Past Medical History:  Diagnosis Date  . Cancer (HCC)    NHL, Malt Lymphoma  . Depression   . 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 Dysphagia   . Dysrhythmia    History of palpatations  . GERD (gastroesophageal reflux disease)    INGESTION   . Heart palpitations   . Hiatal hernia   . Hyperlipidemia   . Interstitial cystitis   . Lung cancer, lower lobe ([place]) 07/21/2018   Left lower lobe, stage Ia squamous cell carcinoma  . 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 Sinusitis    Past Surgical History:  Procedure Laterality Date  . ABDOMINAL HYSTERECTOMY    . BIOPSY  09/01/2016   Procedure: BIOPSY;  Surgeon: Daneil Dolin, MD;  [facility];  Service: Endoscopy;;  gastric  . BIOPSY  11/29/2018   Procedure: BIOPSY;  Surgeon: Daneil Dolin, MD;  [facility];  Service: Endoscopy;;  right colon  . BLADDER SURGERY    . CATARACT EXTRACTION, BILATERAL    . COLONOSCOPY     Dr. Lindalou Hose 2009: Normal per PCP notes  . COLONOSCOPY N/A 11/29/2018   Procedure: COLONOSCOPY;  Surgeon: Daneil Dolin, MD;  [facility];  Service: Endoscopy;  Laterality: N/A;  7:30am  . ESOPHAGOGASTRODUODENOSCOPY     RMR: Prominant Schzgzki ring/component of peptic stricture status post dilation and disruption as described above, otherwise norma esophagus, moderate-sized hiatal hernia, antal pyloric channel, and posterier bulbar erosions, otherwise unremarkable stomach, D1 and D2 . Inflammatory findings on the stomach and duodenum will likely be related to aspirin effect. We need to rule out Helicobacter pylor  . ESOPHAGOGASTRODUODENOSCOPY N/A 03/10/2015   Dr. 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 Pathology with  lymphoid population of stomach, slight atypia  . ESOPHAGOGASTRODUODENOSCOPY N/A 02/03/2016   Dr. Gala Romney: Schatzki ring noted at GE junction, dilated with 42 and then 43 Amity dilator. Large hiatal hernia. 6 x 7 cm nodular geographically ulcerated mucosa, biopsy c/w MALToma  . ESOPHAGOGASTRODUODENOSCOPY N/A 04/08/2016   Dr. Gala Romney: moderate Schatzki's ring/web s/p dilation, large hiatal hernia, localized area of gastric lymphoma visualized and appeared to be much improved. normal second portion of the duodenum. No specimens collected. Esophageal lumen notably tighted up significantly since her dilation in April of this year.   . ESOPHAGOGASTRODUODENOSCOPY N/A 08/04/2016   Procedure: ESOPHAGOGASTRODUODENOSCOPY (EGD);  Surgeon: Daneil Dolin, MD;  [facility];  Service: Endoscopy;  Laterality: N/A;  7:30 am  . ESOPHAGOGASTRODUODENOSCOPY N/A 09/01/2016   Procedure: ESOPHAGOGASTRODUODENOSCOPY (EGD);  Surgeon: Daneil Dolin, MD;  [facility];  Service: Endoscopy;  Laterality: N/A;  830   . ESOPHAGOGASTRODUODENOSCOPY N/A 05/10/2017   Dr. Gala Romney: Moderate Schatzki ring at the GE junction, status post dilation with 16 [place].  Medium sized hiatal hernia.  Few localized erosions in the gastric antrum.  Stomach biopsy showed chronic gastritis, no H. pylori.  No atypical lymphoid infiltrates or other features of lymphoproliferative process  . ESOPHAGOGASTRODUODENOSCOPY N/A 06/13/2018   Dr. Gala Romney: Schatzki ring status post dilation.  Large hiatal hernia.  Focal area 4 x 4 cm along the greater curvature, somewhat erythematous and thickened mucosa, biopsy benign.  Next EGD in August 2020.  Marland Kitchen LOBECTOMY Left 05/19/2018   Procedure: LEFT LOWER LOBECTOMY;  Surgeon: [name], MD;  Location: [facility];  Service: Thoracic;  Laterality: Left;  Marland Kitchen MALONEY DILATION N/A 03/10/2015   Procedure: Venia Minks DILATION;  Surgeon: Daneil Dolin, MD;  [facility];  Service: Endoscopy;   Laterality: N/A;  . Venia Minks DILATION N/A 02/03/2016   Procedure: Venia Minks DILATION;  Surgeon: Daneil Dolin, MD;  [facility];  Service: Endoscopy;  Laterality: N/A;  . Venia Minks DILATION N/A 04/08/2016   Procedure: Venia Minks DILATION;  Surgeon: Daneil Dolin, MD;  [facility];  Service: Endoscopy;  Laterality: N/A;  . Venia Minks DILATION N/A 05/10/2017   Procedure: Venia Minks DILATION;  Surgeon: Daneil Dolin, MD;  [facility];  Service: Endoscopy;  Laterality: N/A;  . Venia Minks DILATION N/A 06/13/2018   Procedure: Venia Minks DILATION;  Surgeon: Daneil Dolin, MD;  [facility];  Service: Endoscopy;  Laterality: N/A;  . POLYPECTOMY  11/29/2018   Procedure: POLYPECTOMY;  Surgeon: Daneil Dolin, MD;  [facility];  Service: Endoscopy;;  . VIDEO ASSISTED THORACOSCOPY (VATS)/WEDGE RESECTION Left 05/19/2018   Procedure: VIDEO ASSISTED THORACOSCOPY (VATS)/WEDGE RESECTION;  Surgeon: [name], MD;  Location: [facility];  Service: Thoracic;  Laterality: Left;     SOCIAL HISTORY:  Social History   Socioeconomic History  . Marital status: Married    Spouse name: Not on  file  . Number of children: Not on file  . Years of education: Not on file  . Highest education level: Not on file  Occupational History  . Not on file  Social Needs  . Financial resource strain: Not on file  . Food insecurity    Worry: Not on file    Inability: Not on file  . Transportation needs    Medical: Not on file    Non-medical: Not on file  Tobacco Use  . Smoking status: Former Smoker    Packs/day: 0.75    Years: 57.00    Pack years: 42.75    Types: Cigarettes    Quit date: 05/23/2018    Years since quitting: 1.2  . Smokeless tobacco: Never Used  . Tobacco comment: trying to quit, wearing patch  Substance and Sexual Activity  . Alcohol use: No    Alcohol/week: 0.0 standard drinks  . Drug use: No  . Sexual activity: Not on file  Lifestyle  . Physical  activity    Days per week: Not on file    Minutes per session: Not on file  . Stress: Not on file  Relationships  . Social Herbalist on phone: Not on file    Gets together: Not on file    Attends religious service: Not on file    Active member of club or organization: Not on file    Attends meetings of clubs or organizations: Not on file    Relationship status: Not on file  . Intimate partner violence    Fear of current or ex partner: Not on file    Emotionally abused: Not on file    Physically abused: Not on file    Forced sexual activity: Not on file  Other Topics Concern  . Not on file  Social History Narrative  . Not on file    FAMILY HISTORY:  Family History  Problem Relation Age of Onset  . Heart attack Father   . Dementia Father   . Diabetes Sister   . Diabetes Brother   . Diabetes Sister   . Colon cancer Neg Hx     CURRENT MEDICATIONS:  Outpatient Encounter Medications as of 08/15/2019  Medication Sig  . citalopram (CELEXA) 20 MG tablet Take 20 mg by mouth daily.   . metFORMIN (GLUCOPHAGE-XR) 500 MG 24 hr tablet Take 500 mg by mouth daily.   . metoprolol tartrate (LOPRESSOR) 25 MG tablet Take 25 mg by mouth every evening.  . Multiple Vitamin (MULTIVITAMIN WITH MINERALS) TABS tablet Take 1 tablet by mouth daily.  . nitrofurantoin (MACRODANTIN) 50 MG capsule TAKE 1 CAPSULE BY MOUTH EVERYDAY AT BEDTIME  . pioglitazone (ACTOS) 15 MG tablet Take 15 mg by mouth daily.  . Polyvinyl Alcohol-Povidone PF (REFRESH) 1.4-0.6 % SOLN Place 1 drop into both eyes 2 (two) times daily.  . pravastatin (PRAVACHOL) 40 MG tablet Take 40 mg by mouth at bedtime.   Marland Kitchen acetaminophen (TYLENOL) 325 MG tablet Take 650 mg by mouth every 6 (six) hours as needed for moderate pain or headache.  . [DISCONTINUED] nitrofurantoin, macrocrystal-monohydrate, (MACROBID) 100 MG capsule Take 100 mg by mouth 2 (two) times daily.   No facility-administered encounter medications on file as of  08/15/2019.     ALLERGIES:  Allergies  Allergen Reactions  . Sulfur Hives     PHYSICAL EXAM:  ECOG Performance status: 1  Vitals:   08/15/19 1138  BP: (!) 142/66  Pulse: 68  Resp: 16  Temp: (!) 97.1 F (36.2 C)  SpO2: 97%   Filed Weights   08/15/19 1138  Weight: 174 lb 12.8 oz (79.3 kg)    Physical Exam Vitals signs reviewed.  Constitutional:      Appearance: Normal appearance.  Cardiovascular:     Rate and Rhythm: Normal rate and regular rhythm.     Heart sounds: Normal heart sounds.  Pulmonary:     Effort: Pulmonary effort is normal.     Breath sounds: Normal breath sounds.  Abdominal:     General: There is no distension.     Palpations: Abdomen is soft. There is no mass.  Musculoskeletal:        General: No swelling.  Lymphadenopathy:     Cervical: No cervical adenopathy.  Skin:    General: Skin is warm.  Neurological:     General: No focal deficit present.     Mental Status: She is alert and oriented to person, place, and time.  Psychiatric:        Mood and Affect: Mood normal.        Behavior: Behavior normal.      LABORATORY DATA:  I have reviewed the labs as listed.  CBC    Component Value Date/Time   WBC 6.3 08/13/2019 1255   RBC 4.71 08/13/2019 1255   HGB 13.4 08/13/2019 1255   HCT 42.6 08/13/2019 1255   PLT 296 08/13/2019 1255   MCV 90.4 08/13/2019 1255   MCH 28.5 08/13/2019 1255   MCHC 31.5 08/13/2019 1255   RDW 14.6 08/13/2019 1255   LYMPHSABS 2.1 08/13/2019 1255   MONOABS 0.6 08/13/2019 1255   EOSABS 0.1 08/13/2019 1255   BASOSABS 0.1 08/13/2019 1255   CMP Latest Ref Rng & Units 08/13/2019 05/02/2019 10/09/2018  Glucose 70 - 99 mg/dL 150(H) 200(H) 225(H)  BUN 8 - 23 mg/dL 15 12 9   Creatinine 0.44 - 1.00 mg/dL 0.83 0.84 0.79  Sodium 135 - 145 mmol/L 134(L) 134(L) 135  Potassium 3.5 - 5.1 mmol/L 4.6 4.5 4.5  Chloride 98 - 111 mmol/L 99 100 100  CO2 22 - 32 mmol/L 25 26 24   Calcium 8.9 - 10.3 mg/dL 9.5 9.5 9.3  Total Protein  6.5 - 8.1 g/dL 7.9 7.6 8.0  Total Bilirubin 0.3 - 1.2 mg/dL 0.4 0.3 0.5  Alkaline Phos 38 - 126 U/L 65 69 59  AST 15 - 41 U/L 30 27 27   ALT 0 - 44 U/L 22 25 17        DIAGNOSTIC IMAGING:  I have independently reviewed the scans and discussed with the patient.   ASSESSMENT & PLAN:   MALToma ([place]) 1.  Left lung squamous cell carcinoma (T1BN0): -Left lower lobectomy on 05/19/2018, 1.5 cm poorly differentiated SCC, margins negative. -PET scan from 04/30/2019 showed new hypermetabolic focus in the right lower lobe, 0.9 cm, SUV 2.6.  Right infrahilar accentuated metabolic activity with SUV 4.3, raising possibility of right infrahilar adenopathy. -We reviewed results of PET scan dated 08/13/2019 which showed right lower lobe nodule measures 1.8 cm with SUV 5.59.  Mild FDG uptake in the right hilum with SUV 3.94 compared to 4.3 previously. -Based on these findings I have recommended follow-up with Dr. Roxan Hockey.  Will order PFTs. -She will be seen back in 6 weeks for follow-up.  2.  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  H. pylori negative. -XRT 30 Gray in 15 fractions from 03/18/2016 through  04/18/2016. -EGD and biopsy on 06/13/2018 shows mild chronic gastritis with small lymphoid aggregates with no features of lymphoma. -Current PET scan on 08/13/2019 did not show any abnormal uptake associated with abdominal organs.  No lymphadenopathy. -Her labs today including LDH are normal.  No palpable adenopathy.  She does not have any B symptoms. -Colonoscopy on 11/29/2018 showed diverticulosis in the sigmoid colon.  2 subcentimeter polyps in the sigmoid colon. -She had EGD scheduled in March of this year but, but was rescheduled due to Covid.   Total time spent is 25 minutes with more than 50% time spent face-to-face discussing scan results, counseling and coordination of care.  Orders  placed this encounter:  Orders Placed This Encounter  Procedures  . Pulmonary Function Test      Derek Jack, MD [place] [phone]

## 2019-08-15 NOTE — Patient Instructions (Addendum)
[facility] at [facility] Discharge Instructions  You were seen today by Dr. Delton Coombes. He went over your recent test results. He will schedule you for a pulmonary function test. He will also refer you to Dr. Roxan Hockey to evaluate for surgery. He will see you back in 6 weeks for follow up.   Thank you for choosing [facility] at [facility] to provide your oncology and hematology care.  To afford each patient quality time with our provider, please arrive at least 15 minutes before your scheduled appointment time.   If you have a lab appointment with the [facility] please come in thru the  Main Entrance and check in at the main information desk  You need to re-schedule your appointment should you arrive 10 or more minutes late.  We strive to give you quality time with our providers, and arriving late affects you and other patients whose appointments are after yours.  Also, if you no show three or more times for appointments you may be dismissed from the clinic at the providers discretion.     Again, thank you for choosing [facility].  Our hope is that these requests will decrease the amount of time that you wait before being seen by our physicians.       _____________________________________________________________  Should you have questions after your visit to [facility], please contact our office at [phone] between the hours of 8:00 a.m. and 4:30 p.m.  Voicemails left after 4:00 p.m. will not be returned until the following business day.  For prescription refill requests, have your pharmacy contact our office and allow 72 hours.    Cancer Center Support Programs:   > Cancer Support Group  2nd Tuesday of the month 1pm-2pm, Journey Room

## 2019-08-16 ENCOUNTER — Encounter (HOSPITAL_COMMUNITY): Payer: Self-pay | Admitting: Hematology

## 2019-08-16 NOTE — Assessment & Plan Note (Signed)
1.  Left lung squamous cell carcinoma (T1BN0): -Left lower lobectomy on 05/19/2018, 1.5 cm poorly differentiated SCC, margins negative. -PET scan from 04/30/2019 showed new hypermetabolic focus in the right lower lobe, 0.9 cm, SUV 2.6.  Right infrahilar accentuated metabolic activity with SUV 4.3, raising possibility of right infrahilar adenopathy. -We reviewed results of PET scan dated 08/13/2019 which showed right lower lobe nodule measures 1.8 cm with SUV 5.59.  Mild FDG uptake in the right hilum with SUV 3.94 compared to 4.3 previously. -Based on these findings I have recommended follow-up with Dr. Roxan Hockey.  Will order PFTs. -She will be seen back in 6 weeks for follow-up.  2.  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  H. pylori negative. -XRT 30 Gray in 15 fractions from 03/18/2016 through 04/18/2016. -EGD and biopsy on 06/13/2018 shows mild chronic gastritis with small lymphoid aggregates with no features of lymphoma. -Current PET scan on 08/13/2019 did not show any abnormal uptake associated with abdominal organs.  No lymphadenopathy. -Her labs today including LDH are normal.  No palpable adenopathy.  She does not have any B symptoms. -Colonoscopy on 11/29/2018 showed diverticulosis in the sigmoid colon.  2 subcentimeter polyps in the sigmoid colon. -She had EGD scheduled in March of this year but, but was rescheduled due to Covid.

## 2019-08-24 ENCOUNTER — Other Ambulatory Visit (HOSPITAL_COMMUNITY)
Admission: RE | Admit: 2019-08-24 | Discharge: 2019-08-24 | Disposition: A | Discharge status: Home / Self Care | Payer: Medicare Other | Source: Ambulatory Visit | Attending: Hematology | Admitting: Hematology

## 2019-08-24 ENCOUNTER — Other Ambulatory Visit: Payer: Self-pay

## 2019-08-24 DIAGNOSIS — Z20828 Contact with and (suspected) exposure to other viral communicable diseases: Secondary | ICD-10-CM | POA: Diagnosis not present

## 2019-08-24 DIAGNOSIS — Z01812 Encounter for preprocedural laboratory examination: Secondary | ICD-10-CM | POA: Diagnosis not present

## 2019-08-24 LAB — SARS CORONAVIRUS 2 (TAT 6-24 HRS): SARS Coronavirus 2: NEGATIVE

## 2019-08-27 ENCOUNTER — Other Ambulatory Visit: Payer: Self-pay

## 2019-08-27 ENCOUNTER — Ambulatory Visit (HOSPITAL_COMMUNITY)
Admission: RE | Admit: 2019-08-27 | Discharge: 2019-08-27 | Disposition: A | Discharge status: Home / Self Care | Payer: Medicare Other | Source: Ambulatory Visit | Attending: Hematology | Admitting: Hematology

## 2019-08-27 DIAGNOSIS — C3432 Malignant neoplasm of lower lobe, left bronchus or lung: Secondary | ICD-10-CM | POA: Diagnosis not present

## 2019-08-27 LAB — PULMONARY FUNCTION TEST
DL/VA % pred: 70 %
DL/VA: 2.84 ml/min/mmHg/L
DLCO cor % pred: 58 %
DLCO cor: 12 ml/min/mmHg
DLCO unc % pred: 58 %
DLCO unc: 12 ml/min/mmHg
FEF 25-75 Post: 1.35 L/sec
FEF 25-75 Pre: 0.96 L/sec
FEF2575-%Change-Post: 40 %
FEF2575-%Pred-Post: 78 %
FEF2575-%Pred-Pre: 55 %
FEV1-%Change-Post: 9 %
FEV1-%Pred-Post: 74 %
FEV1-%Pred-Pre: 67 %
FEV1-Post: 1.68 L
FEV1-Pre: 1.53 L
FEV1FVC-%Change-Post: -9 %
FEV1FVC-%Pred-Pre: 93 %
FEV6-%Change-Post: 14 %
FEV6-%Pred-Post: 87 %
FEV6-%Pred-Pre: 76 %
FEV6-Post: 2.53 L
FEV6-Pre: 2.2 L
FEV6FVC-%Change-Post: -5 %
FEV6FVC-%Pred-Post: 99 %
FEV6FVC-%Pred-Pre: 105 %
FVC-%Change-Post: 20 %
FVC-%Pred-Post: 88 %
FVC-%Pred-Pre: 72 %
FVC-Post: 2.66 L
FVC-Pre: 2.2 L
Post FEV1/FVC ratio: 63 %
Post FEV6/FVC ratio: 95 %
Pre FEV1/FVC ratio: 70 %
Pre FEV6/FVC Ratio: 100 %
RV % pred: 109 %
RV: 2.63 L
TLC % pred: 96 %
TLC: 5.14 L

## 2019-08-27 MED ORDER — ALBUTEROL SULFATE (2.5 MG/3ML) 0.083% IN NEBU
2.5000 mg | INHALATION_SOLUTION | Freq: Once | RESPIRATORY_TRACT | Status: AC
  Administered 2019-08-27: 2.5 mg via RESPIRATORY_TRACT

## 2019-08-28 ENCOUNTER — Encounter: Payer: Self-pay | Admitting: *Deleted

## 2019-08-28 ENCOUNTER — Institutional Professional Consult (permissible substitution) (INDEPENDENT_AMBULATORY_CARE_PROVIDER_SITE_OTHER): Payer: Medicare Other | Admitting: Thoracic Surgery (Cardiothoracic Vascular Surgery)

## 2019-08-28 ENCOUNTER — Other Ambulatory Visit: Payer: Self-pay | Admitting: *Deleted

## 2019-08-28 ENCOUNTER — Encounter: Payer: Self-pay | Admitting: Thoracic Surgery (Cardiothoracic Vascular Surgery)

## 2019-08-28 ENCOUNTER — Other Ambulatory Visit: Payer: Self-pay

## 2019-08-28 VITALS — BP 148/68 | HR 75 | Temp 96.8°F | Resp 16 | Ht 66.0 in | Wt 170.0 lb

## 2019-08-28 DIAGNOSIS — D381 Neoplasm of uncertain behavior of trachea, bronchus and lung: Secondary | ICD-10-CM

## 2019-08-28 DIAGNOSIS — Z902 Acquired absence of lung [part of]: Secondary | ICD-10-CM

## 2019-08-28 DIAGNOSIS — R911 Solitary pulmonary nodule: Secondary | ICD-10-CM

## 2019-08-28 DIAGNOSIS — C3432 Malignant neoplasm of lower lobe, left bronchus or lung: Secondary | ICD-10-CM | POA: Diagnosis not present

## 2019-08-28 NOTE — Progress Notes (Signed)
PCP is Sasser, Silvestre Moment, MD Referring Provider is Derek Jack, MD  Chief Complaint  Patient presents with  . Lung Lesion    RLLobe per PET 08/03/19, PFT 1026/20    HPI: Monica [name] is sent for consultation regarding a right lower lobe lung nodule seen on PET/CT.  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  She was found to have a 1.3 cm left lower lobe nodule about a year and a half ago.  She had a thoracoscopic left lower lobectomy on 05/19/2018.  The nodule turned out to be a stage I a squamous cell carcinoma.  She had atrial fibrillation postoperatively which resolved with amiodarone.  She did not require adjuvant therapy.  She has been followed since that time.  She had a PET CT in June which showed a new 9 mm pulmonary nodule with an SUV of 2.6.  A repeat PET/CT showed the nodule had increased in size to 1.8 cm with an SUV of 5.6.  There also was some mild FDG uptake in the right hilum with an SUV of 3.94.  It is difficult to determine adenopathy on the noncontrast scan.  She is been feeling well.  She says that she does get short of breath with exertion since her previous resection.  However, she says that she can do most anything she wants to.  She does have a chronic productive cough of clear sputum.  She has gained weight during the Covid isolation.  She has not had any change in appetite.  She has not had any unusual headaches or visual changes.  She quit smoking initially after her surgery but had started back about 3 months later.  She is currently smoking 1/2 pack/day.  Overall greater than 50-pack-year history of smoking.  Zubrod Score: At the time of surgery this patient's most appropriate activity status/level should be described as: []     0     Normal activity, no symptoms [x]     1    Restricted in physical strenuous activity but ambulatory, able to do out light work []     2    Ambulatory and capable of self care, unable to do work activities, up and about >50 % of waking hours                              []     3    Only limited self care, in bed greater than 50% of waking hours []     4    Completely disabled, no self care, confined to bed or chair []     5    Moribund  Past Medical History:  Diagnosis Date  . Cancer (HCC)    NHL, Malt Lymphoma  . Depression   . DM (diabetes mellitus) ([place])    TYPE  2  . Dysphagia   . Dysrhythmia    History of palpatations  . GERD (gastroesophageal reflux disease)    INGESTION   . Heart palpitations   . Hiatal hernia   . Hyperlipidemia   . Interstitial cystitis   . Lung cancer, lower lobe ([place]) 07/21/2018   Left lower lobe, stage Ia squamous cell carcinoma  . 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  Sinusitis     Past Surgical History:  Procedure Laterality Date  . ABDOMINAL HYSTERECTOMY    . BIOPSY  09/01/2016   Procedure: BIOPSY;  Surgeon: Daneil Dolin, MD;  [facility];  Service: Endoscopy;;  gastric  . BIOPSY  11/29/2018   Procedure: BIOPSY;  Surgeon: Daneil Dolin, MD;  [facility];  Service: Endoscopy;;  right colon  . BLADDER SURGERY    . CATARACT EXTRACTION, BILATERAL    . COLONOSCOPY     Dr. Lindalou Hose 2009: Normal per PCP notes  . COLONOSCOPY N/A 11/29/2018   Procedure: COLONOSCOPY;  Surgeon: Daneil Dolin, MD;  [facility];  Service: Endoscopy;  Laterality: N/A;  7:30am  . ESOPHAGOGASTRODUODENOSCOPY     RMR: Prominant Schzgzki ring/component of peptic stricture status post dilation and disruption as described above, otherwise norma esophagus, moderate-sized hiatal hernia, antal pyloric channel, and posterier bulbar erosions, otherwise unremarkable stomach, D1 and D2 . Inflammatory findings on the stomach and  duodenum will likely be related to aspirin effect. We need to rule out Helicobacter pylor  . ESOPHAGOGASTRODUODENOSCOPY N/A 03/10/2015   Dr. 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 Pathology with lymphoid population of stomach, slight atypia  . ESOPHAGOGASTRODUODENOSCOPY N/A 02/03/2016   Dr. Gala Romney: Schatzki ring noted at GE junction, dilated with 42 and then 45 [place] dilator. Large hiatal hernia. 6 x 7 cm nodular geographically ulcerated mucosa, biopsy c/w MALToma  . ESOPHAGOGASTRODUODENOSCOPY N/A 04/08/2016   Dr. Gala Romney: moderate Schatzki's ring/web s/p dilation, large hiatal hernia, localized area of gastric lymphoma visualized and appeared to be much improved. normal second portion of the duodenum. No specimens collected. Esophageal lumen notably tighted up significantly since her dilation in April of this year.   . ESOPHAGOGASTRODUODENOSCOPY N/A 08/04/2016   Procedure: ESOPHAGOGASTRODUODENOSCOPY (EGD);  Surgeon: Daneil Dolin, MD;  [facility];  Service: Endoscopy;  Laterality: N/A;  7:30 am  . ESOPHAGOGASTRODUODENOSCOPY N/A 09/01/2016   Procedure: ESOPHAGOGASTRODUODENOSCOPY (EGD);  Surgeon: Daneil Dolin, MD;  [facility];  Service: Endoscopy;  Laterality: N/A;  830   . ESOPHAGOGASTRODUODENOSCOPY N/A 05/10/2017   Dr. Gala Romney: Moderate Schatzki ring at the GE junction, status post dilation with 53 [place].  Medium sized hiatal hernia.  Few localized erosions in the gastric antrum.  Stomach biopsy showed chronic gastritis, no H. pylori.  No atypical lymphoid infiltrates or other features of lymphoproliferative process  . ESOPHAGOGASTRODUODENOSCOPY N/A 06/13/2018   Dr. Gala Romney: Schatzki ring status post dilation.  Large hiatal hernia.  Focal area 4 x 4 cm along the greater curvature, somewhat erythematous and thickened mucosa, biopsy benign.  Next EGD in August 2020.  Marland Kitchen LOBECTOMY Left 05/19/2018   Procedure:  LEFT LOWER LOBECTOMY;  Surgeon: [name], MD;  Location: [facility];  Service: Thoracic;  Laterality: Left;  Marland Kitchen MALONEY DILATION N/A 03/10/2015   Procedure: Venia Minks DILATION;  Surgeon: Daneil Dolin, MD;  [facility];  Service: Endoscopy;  Laterality: N/A;  . Venia Minks DILATION N/A 02/03/2016   Procedure: Venia Minks DILATION;  Surgeon: Daneil Dolin, MD;  [facility];  Service: Endoscopy;  Laterality: N/A;  . Venia Minks DILATION N/A 04/08/2016   Procedure: Venia Minks DILATION;  Surgeon: Daneil Dolin, MD;  [facility];  Service: Endoscopy;  Laterality: N/A;  . Venia Minks DILATION N/A 05/10/2017   Procedure: Venia Minks DILATION;  Surgeon: Daneil Dolin, MD;  [facility];  Service: Endoscopy;  Laterality: N/A;  . Venia Minks DILATION N/A 06/13/2018   Procedure: Venia Minks DILATION;  Surgeon: Daneil Dolin, MD;  [facility];  Service: Endoscopy;  Laterality: N/A;  . POLYPECTOMY  11/29/2018   Procedure: POLYPECTOMY;  Surgeon: Daneil Dolin, MD;  [facility];  Service: Endoscopy;;  . VIDEO ASSISTED THORACOSCOPY (VATS)/WEDGE RESECTION Left 05/19/2018   Procedure: VIDEO ASSISTED THORACOSCOPY (VATS)/WEDGE RESECTION;  Surgeon: [name], MD;  Location: [facility];  Service: Thoracic;  Laterality: Left;    Family History  Problem Relation Age of Onset  . Heart attack Father   . Dementia Father   . Diabetes Sister   . Diabetes Brother   . Diabetes Sister   . Colon cancer Neg Hx     Social History Social History   Tobacco Use  . Smoking status: Former Smoker    Packs/day: 0.75    Years: 57.00    Pack years: 42.75    Types: Cigarettes    Quit date: 05/23/2018    Years since quitting: 1.2  . Smokeless tobacco: Never Used  . Tobacco comment: trying to quit, wearing patch  Substance Use Topics  . Alcohol use: No    Alcohol/week: 0.0 standard drinks  . Drug use: No    Current Outpatient Medications  Medication Sig Dispense  Refill  . acetaminophen (TYLENOL) 325 MG tablet Take 650 mg by mouth every 6 (six) hours as needed for moderate pain or headache.    . citalopram (CELEXA) 20 MG tablet Take 20 mg by mouth daily.     . metFORMIN (GLUCOPHAGE-XR) 500 MG 24 hr tablet Take 500 mg by mouth daily.     . metoprolol tartrate (LOPRESSOR) 25 MG tablet Take 25 mg by mouth every evening.    . Multiple Vitamin (MULTIVITAMIN WITH MINERALS) TABS tablet Take 1 tablet by mouth daily.    . pioglitazone (ACTOS) 15 MG tablet Take 15 mg by mouth daily.    . Polyvinyl Alcohol-Povidone PF (REFRESH) 1.4-0.6 % SOLN Place 1 drop into both eyes 2 (two) times daily.    . pravastatin (PRAVACHOL) 40 MG tablet Take 40 mg by mouth at bedtime.      No current facility-administered medications for this visit.     Allergies  Allergen Reactions  . Sulfur Hives    Review of Systems  Constitutional: Positive for activity change and fatigue. Negative for appetite change.  HENT: Negative for trouble swallowing and voice change.   Eyes: Negative for visual disturbance.  Respiratory: Positive for cough and shortness of breath. Negative for wheezing.   Cardiovascular: Positive for palpitations. Negative for chest pain and leg swelling.  Gastrointestinal: Positive for abdominal pain (Reflux).  Genitourinary: Negative for difficulty urinating and dysuria.  Musculoskeletal: Negative for arthralgias and joint swelling.       Foot pain  Neurological: Negative for seizures, syncope and weakness.  Hematological: Negative for adenopathy. Bruises/bleeds easily.    BP (!) 148/68 (BP Location: Right Arm, Patient Position: Sitting, Cuff Size: Normal)   Pulse 75   Temp (!) 96.8 F (36 C)   Resp 16   Ht 5\' 6"  (1.676 m)   Wt 170 lb (77.1 kg)   SpO2 95% Comment: RA  BMI 27.44 kg/m  Physical Exam Constitutional:      General: She is not in acute distress.    Appearance: Normal appearance.  HENT:     Head: Normocephalic and atraumatic.  Eyes:      General:  No scleral icterus.    Extraocular Movements: Extraocular movements intact.  Neck:     Musculoskeletal: Normal range of motion and neck supple. No neck rigidity.     Vascular: No carotid bruit.  Cardiovascular:     Rate and Rhythm: Normal rate and regular rhythm.     Heart sounds: Normal heart sounds. No murmur. No friction rub. No gallop.   Pulmonary:     Effort: Pulmonary effort is normal. No respiratory distress.     Breath sounds: No wheezing or rales.     Comments: Diminished breath sounds at left base Abdominal:     General: There is no distension.     Palpations: Abdomen is soft.     Tenderness: There is no abdominal tenderness.  Musculoskeletal:     Right lower leg: No edema.     Left lower leg: No edema.  Lymphadenopathy:     Cervical: No cervical adenopathy.  Skin:    General: Skin is warm and dry.  Neurological:     General: No focal deficit present.     Mental Status: She is alert and oriented to person, place, and time.     Cranial Nerves: No cranial nerve deficit.     Motor: No weakness.     Coordination: Coordination normal.    Diagnostic Tests: NUCLEAR MEDICINE PET SKULL BASE TO THIGH  TECHNIQUE: 11.9 mCi F-18 FDG was injected intravenously. Full-ring PET imaging was performed from the skull base to thigh after the radiotracer. CT data was obtained and used for attenuation correction and anatomic localization.  Fasting blood glucose: 139 mg/dl  COMPARISON:  04/30/2019  FINDINGS: Mediastinal blood pool activity: SUV max 2.78  Liver activity: SUV max NA  NECK: No hypermetabolic lymph nodes in the neck.  Incidental CT findings: none  CHEST: No hypermetabolic or enlarged axillary or supraclavicular lymph nodes. No FDG avid mediastinal lymph nodes. Solid nodule within the right lower lobe measures 1.8 cm and has an SUV max of 5.59. Previously 1.1 cm with SUV max of 2.6. Mild FDG uptake in the right hilum has a SUV max of 3.94. This  is compared with 4.3 previously.  Incidental CT findings: Left lower lobectomy. Emphysema. Aortic atherosclerosis. 3 vessel coronary artery calcifications. Moderate to large hiatal hernia.  ABDOMEN/PELVIS: No abnormal FDG uptake identified within the liver, pancreas, spleen or adrenal glands. No hypermetabolic abdominopelvic lymph nodes.  Incidental CT findings: Lower pole exophytic right kidney cyst measures 3.6 cm. Aortic atherosclerosis. Extensive sigmoid diverticulosis without acute inflammation.  SKELETON: No focal hypermetabolic activity to suggest skeletal metastasis.  Incidental CT findings: none  IMPRESSION: 1. Continued increase in size and FDG uptake associated with right lower lobe lung nodule. Concerning for primary bronchogenic carcinoma. 2. Similar mild FDG uptake within the right hilar region with SUV max of 3.9. 3. Aortic Atherosclerosis (ICD10-I70.0) and Emphysema (ICD10-J43.9). Coronary artery calcifications.   Electronically Signed   By: Kerby Moors M.D.   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   In June  she was found to have a new right lower lobe lung nodule that was hypermetabolic on PET/CT.  It was 9 mm at that time.  On follow-up the nodule increased in size to 1.5 cm and was more hypermetabolic with an SUV of 5.6.  On both PET CTs there was some activity in a level 11 node, although it with a noncontrast scan  is difficult to tell if there is adenopathy.  Clinical findings are consistent with a stage IIa (T1, N1) non-small cell carcinoma.  If the node is only reactive it would be T1, N0, stage Ia.  I discussed multiple treatment options with Mrs. Catlin.  She understands that if the lymph node is positive she will need multimodality therapy including chemotherapy regardless of the treatment of the primary site.  In terms of treatment of the primary site the options are surgical resection and radiation.  If she were to opt for radiation we would want to do a navigational bronchoscopy and endobronchial ultrasound for definitive diagnosis and staging.  If she would wish to pursue surgery we would go straight to surgery because a negative biopsy would not change the recommendation.  I discussed with her that resection would require lobectomy.  This does not appear to be amenable to a segmentectomy.  This will have a proportionally greater impact on her respiratory capacity then the left lower lobectomy did as it will be approximately one third of her remaining lung function versus one quarter previously.  Interestingly, her pulmonary function testing is essentially equivalent to or slightly better than it was prior to her previous surgery.  I do think she has adequate lung function to tolerate resection but she will have some physical limitations afterwards.  Her predicted postoperative FEV1 would be about 1.0.  She is familiar with the proposed operation.  I discussed the general nature of right VATS for right lower lobectomy with Mrs. Minella.  I informed her of the need for general anesthesia, the incisions to be used, the use of drains to postoperatively, the expected hospital stay, and the overall recovery.  I informed her of the indications, risk, benefits, and alternatives.  She understands the risks include, but are not limited to death, MI, stroke, DVT, PE, bleeding, possible need for transfusion, infection,  prolonged air leak, cardiac arrhythmias, as well as the possibility of other unforeseeable complications.  She understands accepts the risks and wishes to proceed.  She prefer surgery over radiation.  Plan: Right VATS for right lower lobectomy and lymph node dissection on Monday, 09/10/2019  [name], MD [facility] [phone]

## 2019-08-28 NOTE — H&P (View-Only) (Signed)
PCP is Sasser, Silvestre Moment, MD Referring Provider is Derek Jack, MD  Chief Complaint  Patient presents with  . Lung Lesion    RLLobe per PET 08/03/19, PFT 1026/20    HPI: Monica [name] is sent for consultation regarding a right lower lobe lung nodule seen on PET/CT.  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  She was found to have a 1.3 cm left lower lobe nodule about a year and a half ago.  She had a thoracoscopic left lower lobectomy on 05/19/2018.  The nodule turned out to be a stage I a squamous cell carcinoma.  She had atrial fibrillation postoperatively which resolved with amiodarone.  She did not require adjuvant therapy.  She has been followed since that time.  She had a PET CT in June which showed a new 9 mm pulmonary nodule with an SUV of 2.6.  A repeat PET/CT showed the nodule had increased in size to 1.8 cm with an SUV of 5.6.  There also was some mild FDG uptake in the right hilum with an SUV of 3.94.  It is difficult to determine adenopathy on the noncontrast scan.  She is been feeling well.  She says that she does get short of breath with exertion since her previous resection.  However, she says that she can do most anything she wants to.  She does have a chronic productive cough of clear sputum.  She has gained weight during the Covid isolation.  She has not had any change in appetite.  She has not had any unusual headaches or visual changes.  She quit smoking initially after her surgery but had started back about 3 months later.  She is currently smoking 1/2 pack/day.  Overall greater than 50-pack-year history of smoking.  Zubrod Score: At the time of surgery this patient's most appropriate activity status/level should be described as: []     0     Normal activity, no symptoms [x]     1    Restricted in physical strenuous activity but ambulatory, able to do out light work []     2    Ambulatory and capable of self care, unable to do work activities, up and about >50 % of waking hours                              []     3    Only limited self care, in bed greater than 50% of waking hours []     4    Completely disabled, no self care, confined to bed or chair []     5    Moribund  Past Medical History:  Diagnosis Date  . Cancer (HCC)    NHL, Malt Lymphoma  . Depression   . DM (diabetes mellitus) ([place])    TYPE  2  . Dysphagia   . Dysrhythmia    History of palpatations  . GERD (gastroesophageal reflux disease)    INGESTION   . Heart palpitations   . Hiatal hernia   . Hyperlipidemia   . Interstitial cystitis   . Lung cancer, lower lobe ([place]) 07/21/2018   Left lower lobe, stage Ia squamous cell carcinoma  . 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  Sinusitis     Past Surgical History:  Procedure Laterality Date  . ABDOMINAL HYSTERECTOMY    . BIOPSY  09/01/2016   Procedure: BIOPSY;  Surgeon: Daneil Dolin, MD;  [facility];  Service: Endoscopy;;  gastric  . BIOPSY  11/29/2018   Procedure: BIOPSY;  Surgeon: Daneil Dolin, MD;  [facility];  Service: Endoscopy;;  right colon  . BLADDER SURGERY    . CATARACT EXTRACTION, BILATERAL    . COLONOSCOPY     Dr. Lindalou Hose 2009: Normal per PCP notes  . COLONOSCOPY N/A 11/29/2018   Procedure: COLONOSCOPY;  Surgeon: Daneil Dolin, MD;  [facility];  Service: Endoscopy;  Laterality: N/A;  7:30am  . ESOPHAGOGASTRODUODENOSCOPY     RMR: Prominant Schzgzki ring/component of peptic stricture status post dilation and disruption as described above, otherwise norma esophagus, moderate-sized hiatal hernia, antal pyloric channel, and posterier bulbar erosions, otherwise unremarkable stomach, D1 and D2 . Inflammatory findings on the stomach and  duodenum will likely be related to aspirin effect. We need to rule out Helicobacter pylor  . ESOPHAGOGASTRODUODENOSCOPY N/A 03/10/2015   Dr. 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 Pathology with lymphoid population of stomach, slight atypia  . ESOPHAGOGASTRODUODENOSCOPY N/A 02/03/2016   Dr. Gala Romney: Schatzki ring noted at GE junction, dilated with 22 and then 54 Golden dilator. Large hiatal hernia. 6 x 7 cm nodular geographically ulcerated mucosa, biopsy c/w MALToma  . ESOPHAGOGASTRODUODENOSCOPY N/A 04/08/2016   Dr. Gala Romney: moderate Schatzki's ring/web s/p dilation, large hiatal hernia, localized area of gastric lymphoma visualized and appeared to be much improved. normal second portion of the duodenum. No specimens collected. Esophageal lumen notably tighted up significantly since her dilation in April of this year.   . ESOPHAGOGASTRODUODENOSCOPY N/A 08/04/2016   Procedure: ESOPHAGOGASTRODUODENOSCOPY (EGD);  Surgeon: Daneil Dolin, MD;  [facility];  Service: Endoscopy;  Laterality: N/A;  7:30 am  . ESOPHAGOGASTRODUODENOSCOPY N/A 09/01/2016   Procedure: ESOPHAGOGASTRODUODENOSCOPY (EGD);  Surgeon: Daneil Dolin, MD;  [facility];  Service: Endoscopy;  Laterality: N/A;  830   . ESOPHAGOGASTRODUODENOSCOPY N/A 05/10/2017   Dr. Gala Romney: Moderate Schatzki ring at the GE junction, status post dilation with 95 [place].  Medium sized hiatal hernia.  Few localized erosions in the gastric antrum.  Stomach biopsy showed chronic gastritis, no H. pylori.  No atypical lymphoid infiltrates or other features of lymphoproliferative process  . ESOPHAGOGASTRODUODENOSCOPY N/A 06/13/2018   Dr. Gala Romney: Schatzki ring status post dilation.  Large hiatal hernia.  Focal area 4 x 4 cm along the greater curvature, somewhat erythematous and thickened mucosa, biopsy benign.  Next EGD in August 2020.  Marland Kitchen LOBECTOMY Left 05/19/2018   Procedure:  LEFT LOWER LOBECTOMY;  Surgeon: [name], MD;  Location: [facility];  Service: Thoracic;  Laterality: Left;  Marland Kitchen MALONEY DILATION N/A 03/10/2015   Procedure: Venia Minks DILATION;  Surgeon: Daneil Dolin, MD;  [facility];  Service: Endoscopy;  Laterality: N/A;  . Venia Minks DILATION N/A 02/03/2016   Procedure: Venia Minks DILATION;  Surgeon: Daneil Dolin, MD;  [facility];  Service: Endoscopy;  Laterality: N/A;  . Venia Minks DILATION N/A 04/08/2016   Procedure: Venia Minks DILATION;  Surgeon: Daneil Dolin, MD;  [facility];  Service: Endoscopy;  Laterality: N/A;  . Venia Minks DILATION N/A 05/10/2017   Procedure: Venia Minks DILATION;  Surgeon: Daneil Dolin, MD;  [facility];  Service: Endoscopy;  Laterality: N/A;  . Venia Minks DILATION N/A 06/13/2018   Procedure: Venia Minks DILATION;  Surgeon: Daneil Dolin, MD;  [facility];  Service: Endoscopy;  Laterality: N/A;  . POLYPECTOMY  11/29/2018   Procedure: POLYPECTOMY;  Surgeon: Daneil Dolin, MD;  [facility];  Service: Endoscopy;;  . VIDEO ASSISTED THORACOSCOPY (VATS)/WEDGE RESECTION Left 05/19/2018   Procedure: VIDEO ASSISTED THORACOSCOPY (VATS)/WEDGE RESECTION;  Surgeon: [name], MD;  Location: [facility];  Service: Thoracic;  Laterality: Left;    Family History  Problem Relation Age of Onset  . Heart attack Father   . Dementia Father   . Diabetes Sister   . Diabetes Brother   . Diabetes Sister   . Colon cancer Neg Hx     Social History Social History   Tobacco Use  . Smoking status: Former Smoker    Packs/day: 0.75    Years: 57.00    Pack years: 42.75    Types: Cigarettes    Quit date: 05/23/2018    Years since quitting: 1.2  . Smokeless tobacco: Never Used  . Tobacco comment: trying to quit, wearing patch  Substance Use Topics  . Alcohol use: No    Alcohol/week: 0.0 standard drinks  . Drug use: No    Current Outpatient Medications  Medication Sig Dispense  Refill  . acetaminophen (TYLENOL) 325 MG tablet Take 650 mg by mouth every 6 (six) hours as needed for moderate pain or headache.    . citalopram (CELEXA) 20 MG tablet Take 20 mg by mouth daily.     . metFORMIN (GLUCOPHAGE-XR) 500 MG 24 hr tablet Take 500 mg by mouth daily.     . metoprolol tartrate (LOPRESSOR) 25 MG tablet Take 25 mg by mouth every evening.    . Multiple Vitamin (MULTIVITAMIN WITH MINERALS) TABS tablet Take 1 tablet by mouth daily.    . pioglitazone (ACTOS) 15 MG tablet Take 15 mg by mouth daily.    . Polyvinyl Alcohol-Povidone PF (REFRESH) 1.4-0.6 % SOLN Place 1 drop into both eyes 2 (two) times daily.    . pravastatin (PRAVACHOL) 40 MG tablet Take 40 mg by mouth at bedtime.      No current facility-administered medications for this visit.     Allergies  Allergen Reactions  . Sulfur Hives    Review of Systems  Constitutional: Positive for activity change and fatigue. Negative for appetite change.  HENT: Negative for trouble swallowing and voice change.   Eyes: Negative for visual disturbance.  Respiratory: Positive for cough and shortness of breath. Negative for wheezing.   Cardiovascular: Positive for palpitations. Negative for chest pain and leg swelling.  Gastrointestinal: Positive for abdominal pain (Reflux).  Genitourinary: Negative for difficulty urinating and dysuria.  Musculoskeletal: Negative for arthralgias and joint swelling.       Foot pain  Neurological: Negative for seizures, syncope and weakness.  Hematological: Negative for adenopathy. Bruises/bleeds easily.    BP (!) 148/68 (BP Location: Right Arm, Patient Position: Sitting, Cuff Size: Normal)   Pulse 75   Temp (!) 96.8 F (36 C)   Resp 16   Ht 5\' 6"  (1.676 m)   Wt 170 lb (77.1 kg)   SpO2 95% Comment: RA  BMI 27.44 kg/m  Physical Exam Constitutional:      General: She is not in acute distress.    Appearance: Normal appearance.  HENT:     Head: Normocephalic and atraumatic.  Eyes:      General:  No scleral icterus.    Extraocular Movements: Extraocular movements intact.  Neck:     Musculoskeletal: Normal range of motion and neck supple. No neck rigidity.     Vascular: No carotid bruit.  Cardiovascular:     Rate and Rhythm: Normal rate and regular rhythm.     Heart sounds: Normal heart sounds. No murmur. No friction rub. No gallop.   Pulmonary:     Effort: Pulmonary effort is normal. No respiratory distress.     Breath sounds: No wheezing or rales.     Comments: Diminished breath sounds at left base Abdominal:     General: There is no distension.     Palpations: Abdomen is soft.     Tenderness: There is no abdominal tenderness.  Musculoskeletal:     Right lower leg: No edema.     Left lower leg: No edema.  Lymphadenopathy:     Cervical: No cervical adenopathy.  Skin:    General: Skin is warm and dry.  Neurological:     General: No focal deficit present.     Mental Status: She is alert and oriented to person, place, and time.     Cranial Nerves: No cranial nerve deficit.     Motor: No weakness.     Coordination: Coordination normal.    Diagnostic Tests: NUCLEAR MEDICINE PET SKULL BASE TO THIGH  TECHNIQUE: 11.9 mCi F-18 FDG was injected intravenously. Full-ring PET imaging was performed from the skull base to thigh after the radiotracer. CT data was obtained and used for attenuation correction and anatomic localization.  Fasting blood glucose: 139 mg/dl  COMPARISON:  04/30/2019  FINDINGS: Mediastinal blood pool activity: SUV max 2.78  Liver activity: SUV max NA  NECK: No hypermetabolic lymph nodes in the neck.  Incidental CT findings: none  CHEST: No hypermetabolic or enlarged axillary or supraclavicular lymph nodes. No FDG avid mediastinal lymph nodes. Solid nodule within the right lower lobe measures 1.8 cm and has an SUV max of 5.59. Previously 1.1 cm with SUV max of 2.6. Mild FDG uptake in the right hilum has a SUV max of 3.94. This  is compared with 4.3 previously.  Incidental CT findings: Left lower lobectomy. Emphysema. Aortic atherosclerosis. 3 vessel coronary artery calcifications. Moderate to large hiatal hernia.  ABDOMEN/PELVIS: No abnormal FDG uptake identified within the liver, pancreas, spleen or adrenal glands. No hypermetabolic abdominopelvic lymph nodes.  Incidental CT findings: Lower pole exophytic right kidney cyst measures 3.6 cm. Aortic atherosclerosis. Extensive sigmoid diverticulosis without acute inflammation.  SKELETON: No focal hypermetabolic activity to suggest skeletal metastasis.  Incidental CT findings: none  IMPRESSION: 1. Continued increase in size and FDG uptake associated with right lower lobe lung nodule. Concerning for primary bronchogenic carcinoma. 2. Similar mild FDG uptake within the right hilar region with SUV max of 3.9. 3. Aortic Atherosclerosis (ICD10-I70.0) and Emphysema (ICD10-J43.9). Coronary artery calcifications.   Electronically Signed   By: Kerby Moors M.D.   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   In June  she was found to have a new right lower lobe lung nodule that was hypermetabolic on PET/CT.  It was 9 mm at that time.  On follow-up the nodule increased in size to 1.5 cm and was more hypermetabolic with an SUV of 5.6.  On both PET CTs there was some activity in a level 11 node, although it with a noncontrast scan  is difficult to tell if there is adenopathy.  Clinical findings are consistent with a stage IIa (T1, N1) non-small cell carcinoma.  If the node is only reactive it would be T1, N0, stage Ia.  I discussed multiple treatment options with Monica [name].  She understands that if the lymph node is positive she will need multimodality therapy including chemotherapy regardless of the treatment of the primary site.  In terms of treatment of the primary site the options are surgical resection and radiation.  If she were to opt for radiation we would want to do a navigational bronchoscopy and endobronchial ultrasound for definitive diagnosis and staging.  If she would wish to pursue surgery we would go straight to surgery because a negative biopsy would not change the recommendation.  I discussed with her that resection would require lobectomy.  This does not appear to be amenable to a segmentectomy.  This will have a proportionally greater impact on her respiratory capacity then the left lower lobectomy did as it will be approximately one third of her remaining lung function versus one quarter previously.  Interestingly, her pulmonary function testing is essentially equivalent to or slightly better than it was prior to her previous surgery.  I do think she has adequate lung function to tolerate resection but she will have some physical limitations afterwards.  Her predicted postoperative FEV1 would be about 1.0.  She is familiar with the proposed operation.  I discussed the general nature of right VATS for right lower lobectomy with Monica [name].  I informed her of the need for general anesthesia, the incisions to be used, the use of drains to postoperatively, the expected hospital stay, and the overall recovery.  I informed her of the indications, risk, benefits, and alternatives.  She understands the risks include, but are not limited to death, MI, stroke, DVT, PE, bleeding, possible need for transfusion, infection,  prolonged air leak, cardiac arrhythmias, as well as the possibility of other unforeseeable complications.  She understands accepts the risks and wishes to proceed.  She prefer surgery over radiation.  Plan: Right VATS for right lower lobectomy and lymph node dissection on Monday, 09/10/2019  [name], MD [facility] [phone]

## 2019-09-05 NOTE — Progress Notes (Signed)
[id number] [place], [place] [address] [place] [address] Phone: [phone] Fax: [phone]  [place], [place] [facility] [address] [place] [address] [place] [address] Phone: [phone] Fax: [phone]    Your procedure is scheduled on Monday, November 9th.  Report to [facility] Main Entrance "A" at 5:30 A.M., and check in at the Admitting office.  Call this number if you have problems the morning of surgery:  [phone]  Call [phone] if you have any questions prior to your surgery date Monday-Friday 8am-4pm   Remember:  Do not eat or drink after midnight the night before your surgery   Take these medicines the morning of surgery with A SIP OF WATER  citalopram (CELEXA)   If needed - acetaminophen (TYLENOL), Polyvinyl Alcohol-Povidone PF (REFRESH)/eye drops   As of today, STOP taking any Aspirin (unless otherwise instructed by your surgeon), Aleve, Naproxen, Ibuprofen, Motrin, Advil, Goody's, BC's, all herbal medications, fish oil, and all vitamins.  How to Manage Your Diabetes Before and After Surgery  WHAT DO I DO ABOUT MY DIABETES MEDICATION?  Marland Kitchen Do not take metFORMIN (GLUCOPHAGE-XR) OR pioglitazone (ACTOS)/oral diabetes medicines (pills) the morning of surgery.  Why is it important to control my blood sugar before and after surgery? . Improving blood sugar levels before and after surgery helps healing and can limit problems. . A way of improving blood sugar control is eating a healthy diet by: o  Eating less sugar and carbohydrates o  Increasing activity/exercise o  Talking with your doctor about reaching your blood sugar goals . High blood sugars (greater than 180 mg/dL) can raise your risk of infections and slow your recovery, so you will need to focus on controlling your diabetes during the weeks before surgery. . Make sure that the doctor who takes  care of your diabetes knows about your planned surgery including the date and location.  How do I manage my blood sugar before surgery? . Check your blood sugar at least 4 times a day, starting 2 days before surgery, to make sure that the level is not too high or low. o Check your blood sugar the morning of your surgery when you wake up and every 2 hours until you get to the Short Stay unit. . If your blood sugar is less than 70 mg/dL, you will need to treat for low blood sugar: o Do not take insulin. o Treat a low blood sugar (less than 70 mg/dL) with  cup of clear juice (cranberry or apple), 4 glucose tablets, OR glucose gel. Recheck blood sugar in 15 minutes after treatment (to make sure it is greater than 70 mg/dL). If your blood sugar is not greater than 70 mg/dL on recheck, call [phone]  for further instructions. . Report your blood sugar to the short stay nurse when you get to Short Stay.  . If you are admitted to the hospital after surgery: o Your blood sugar will be checked by the staff and you will probably be given insulin after surgery (instead of oral diabetes medicines) to make sure you have good blood sugar levels. o The goal for blood sugar control after surgery is 80-180 mg/dL.  Reviewed and Endorsed by [facility] Patient Education Committee, August 2015   The Morning of Surgery  Do not wear jewelry, make-up or nail polish.  Do not wear lotions, powders, or perfumes/colognes, or  deodorant  Do not shave 48 hours prior to surgery.    Do not bring valuables to the hospital.  [facility] is not responsible for any belongings or valuables.  If you are a smoker, DO NOT Smoke 24 hours prior to surgery IF you wear a CPAP at night please bring your mask, tubing, and machine the morning of surgery   Remember that you must have someone to transport you home after your surgery, and remain with you for 24 hours if you are discharged the same day.  Contacts, glasses, hearing  aids, dentures or bridgework may not be worn into surgery.   Leave your suitcase in the car.  After surgery it may be brought to your room.  For patients admitted to the hospital, discharge time will be determined by your treatment team.  Patients discharged the day of surgery will not be allowed to drive home.   Special instructions:   [facility]- Preparing For Surgery  Before surgery, you can play an important role. Because skin is not sterile, your skin needs to be as free of germs as possible. You can reduce the number of germs on your skin by washing with CHG (chlorahexidine gluconate) Soap before surgery.  CHG is an antiseptic cleaner which kills germs and bonds with the skin to continue killing germs even after washing.    Oral Hygiene is also important to reduce your risk of infection.  Remember - BRUSH YOUR TEETH THE MORNING OF SURGERY WITH YOUR REGULAR TOOTHPASTE  Please do not use if you have an allergy to CHG or antibacterial soaps. If your skin becomes reddened/irritated stop using the CHG.  Do not shave (including legs and underarms) for at least 48 hours prior to first CHG shower. It is OK to shave your face.  Please follow these instructions carefully.   1. Shower the NIGHT BEFORE SURGERY and the MORNING OF SURGERY with CHG Soap.   2. If you chose to wash your hair, wash your hair first as usual with your normal shampoo.  3. After you shampoo, rinse your hair and body thoroughly to remove the shampoo.  4. Use CHG as you would any other liquid soap. You can apply CHG directly to the skin and wash gently with a scrungie or a clean washcloth.   5. Apply the CHG Soap to your body ONLY FROM THE NECK DOWN.  Do not use on open wounds or open sores. Avoid contact with your eyes, ears, mouth and genitals (private parts). Wash Face and genitals (private parts)  with your normal soap.   6. Wash thoroughly, paying special attention to the area where your surgery will be  performed.  7. Thoroughly rinse your body with warm water from the neck down.  8. DO NOT shower/wash with your normal soap after using and rinsing off the CHG Soap.  9. Pat yourself dry with a CLEAN TOWEL.  10. Wear CLEAN PAJAMAS to bed the night before surgery, wear comfortable clothes the morning of surgery  11. Place CLEAN SHEETS on your bed the night of your first shower and DO NOT SLEEP WITH PETS.  Day of Surgery: Do not apply any deodorants/lotions. Please shower the morning of surgery with the CHG soap  Please wear clean clothes to the hospital/surgery center.   Remember to brush your teeth WITH YOUR REGULAR TOOTHPASTE.  Please read over the following fact sheets that you were given.

## 2019-09-06 ENCOUNTER — Encounter (HOSPITAL_COMMUNITY)
Admission: RE | Admit: 2019-09-06 | Discharge: 2019-09-06 | Disposition: A | Discharge status: Home / Self Care | Payer: Medicare Other | Source: Ambulatory Visit | Attending: Thoracic Surgery (Cardiothoracic Vascular Surgery) | Admitting: Thoracic Surgery (Cardiothoracic Vascular Surgery)

## 2019-09-06 ENCOUNTER — Other Ambulatory Visit: Payer: Self-pay

## 2019-09-06 ENCOUNTER — Encounter (HOSPITAL_COMMUNITY): Payer: Self-pay

## 2019-09-06 ENCOUNTER — Other Ambulatory Visit (HOSPITAL_COMMUNITY)
Admission: RE | Admit: 2019-09-06 | Discharge: 2019-09-06 | Disposition: A | Discharge status: Home / Self Care | Payer: Medicare Other | Source: Ambulatory Visit | Attending: Thoracic Surgery (Cardiothoracic Vascular Surgery) | Admitting: Thoracic Surgery (Cardiothoracic Vascular Surgery)

## 2019-09-06 DIAGNOSIS — I97191 Other postprocedural cardiac functional disturbances following other surgery: Secondary | ICD-10-CM | POA: Diagnosis not present

## 2019-09-06 DIAGNOSIS — J9382 Other air leak: Secondary | ICD-10-CM | POA: Diagnosis not present

## 2019-09-06 DIAGNOSIS — I10 Essential (primary) hypertension: Secondary | ICD-10-CM | POA: Diagnosis not present

## 2019-09-06 DIAGNOSIS — F17210 Nicotine dependence, cigarettes, uncomplicated: Secondary | ICD-10-CM | POA: Diagnosis not present

## 2019-09-06 DIAGNOSIS — I480 Paroxysmal atrial fibrillation: Secondary | ICD-10-CM | POA: Diagnosis not present

## 2019-09-06 DIAGNOSIS — I4510 Unspecified right bundle-branch block: Secondary | ICD-10-CM | POA: Diagnosis not present

## 2019-09-06 DIAGNOSIS — I959 Hypotension, unspecified: Secondary | ICD-10-CM | POA: Diagnosis not present

## 2019-09-06 DIAGNOSIS — C3431 Malignant neoplasm of lower lobe, right bronchus or lung: Secondary | ICD-10-CM | POA: Diagnosis not present

## 2019-09-06 DIAGNOSIS — E785 Hyperlipidemia, unspecified: Secondary | ICD-10-CM | POA: Diagnosis not present

## 2019-09-06 DIAGNOSIS — Z01812 Encounter for preprocedural laboratory examination: Secondary | ICD-10-CM | POA: Diagnosis not present

## 2019-09-06 DIAGNOSIS — R001 Bradycardia, unspecified: Secondary | ICD-10-CM | POA: Diagnosis not present

## 2019-09-06 DIAGNOSIS — E119 Type 2 diabetes mellitus without complications: Secondary | ICD-10-CM | POA: Diagnosis not present

## 2019-09-06 DIAGNOSIS — R911 Solitary pulmonary nodule: Secondary | ICD-10-CM | POA: Diagnosis not present

## 2019-09-06 DIAGNOSIS — Z20828 Contact with and (suspected) exposure to other viral communicable diseases: Secondary | ICD-10-CM | POA: Diagnosis not present

## 2019-09-06 DIAGNOSIS — J9811 Atelectasis: Secondary | ICD-10-CM | POA: Diagnosis not present

## 2019-09-06 LAB — COMPREHENSIVE METABOLIC PANEL
ALT: 16 U/L (ref 0–44)
AST: 23 U/L (ref 15–41)
Albumin: 3.7 g/dL (ref 3.5–5.0)
Alkaline Phosphatase: 61 U/L (ref 38–126)
Anion gap: 9 (ref 5–15)
BUN: 12 mg/dL (ref 8–23)
CO2: 21 mmol/L — ABNORMAL LOW (ref 22–32)
Calcium: 9.1 mg/dL (ref 8.9–10.3)
Chloride: 105 mmol/L (ref 98–111)
Creatinine, Ser: 0.79 mg/dL (ref 0.44–1.00)
GFR calc Af Amer: >60 mL/min (ref >60)
GFR calc non Af Amer: >60 mL/min (ref >60)
Glucose, Bld: 207 mg/dL — ABNORMAL HIGH (ref 70–99)
Potassium: 4.4 mmol/L (ref 3.5–5.1)
Sodium: 135 mmol/L (ref 135–145)
Total Bilirubin: <0.1 mg/dL — ABNORMAL LOW (ref 0.3–1.2)
Total Protein: 7.1 g/dL (ref 6.5–8.1)

## 2019-09-06 LAB — TYPE AND SCREEN
ABO/RH(D): O POS
Antibody Screen: NEGATIVE

## 2019-09-06 LAB — GLUCOSE, CAPILLARY: Glucose-Capillary: 208 mg/dL — ABNORMAL HIGH (ref 70–99)

## 2019-09-06 LAB — URINALYSIS, ROUTINE W REFLEX MICROSCOPIC
Bilirubin Urine: NEGATIVE
Glucose, UA: NEGATIVE mg/dL
Hgb urine dipstick: NEGATIVE
Ketones, ur: NEGATIVE mg/dL
Nitrite: NEGATIVE
Protein, ur: NEGATIVE mg/dL
Specific Gravity, Urine: 1.021 (ref 1.005–1.030)
pH: 5 (ref 5.0–8.0)

## 2019-09-06 LAB — CBC
HCT: 39.7 % (ref 36.0–46.0)
Hemoglobin: 13 g/dL (ref 12.0–15.0)
MCH: 29.3 pg (ref 26.0–34.0)
MCHC: 32.7 g/dL (ref 30.0–36.0)
MCV: 89.6 fL (ref 80.0–100.0)
Platelets: 244 10*3/uL (ref 150–400)
RBC: 4.43 MIL/uL (ref 3.87–5.11)
RDW: 15 % (ref 11.5–15.5)
WBC: 6.7 10*3/uL (ref 4.0–10.5)
nRBC: 0 % (ref 0.0–0.2)

## 2019-09-06 LAB — SURGICAL PCR SCREEN
MRSA, PCR: NEGATIVE
Staphylococcus aureus: NEGATIVE

## 2019-09-06 LAB — PROTIME-INR
INR: 1 (ref 0.8–1.2)
Prothrombin Time: 13.1 seconds (ref 11.4–15.2)

## 2019-09-06 LAB — HEMOGLOBIN A1C
Hgb A1c MFr Bld: 8 % — ABNORMAL HIGH (ref 4.8–5.6)
Mean Plasma Glucose: 182.9 mg/dL

## 2019-09-06 LAB — APTT: aPTT: 27 seconds (ref 24–36)

## 2019-09-06 NOTE — Progress Notes (Signed)
PCP - Dr. Consuello Masse Cardiologist - denies  PPM/ICD - denies Device Orders - N/A Rep Notified - N/A  Chest x-ray - DOS (09/10/2019) EKG - 09/06/2019 Stress Test - denies ECHO - 06/28/18 Cardiac Cath - denies  Sleep Study - denies CPAP - N/A  Fasting Blood Sugar - 116 - 164 Checks Blood Sugar 2X/week   Blood Thinner Instructions: N/A Aspirin Instructions: Patient instructed to call surgeon's office  ERAS Protcol - No PRE-SURGERY Ensure or G2- N/A  COVID TEST- Test pending, 09/06/2019. Patient verbalized understanding of self-quarantine.  Anesthesia review: No  Patient denies shortness of breath, fever, cough and chest pain at PAT appointment  All instructions explained to the patient, with a verbal understanding of the material. Patient agrees to go over the instructions while at home for a better understanding. Patient also instructed to self quarantine after being tested for COVID-19. The opportunity to ask questions was provided.

## 2019-09-07 LAB — NOVEL CORONAVIRUS, NAA (HOSP ORDER, SEND-OUT TO REF LAB; TAT 18-24 HRS): SARS-CoV-2, NAA: NOT DETECTED

## 2019-09-09 ENCOUNTER — Encounter (HOSPITAL_COMMUNITY): Payer: Self-pay | Admitting: Certified Registered Nurse Anesthetist

## 2019-09-09 NOTE — Anesthesia Preprocedure Evaluation (Addendum)
Anesthesia Evaluation  Patient identified by MRN, date of birth, ID band Patient awake    Reviewed: Allergy & Precautions, NPO status , Patient's Chart, lab work & pertinent test results  Airway Mallampati: II  TM Distance: >3 FB     Dental   Pulmonary Current Smoker and Patient abstained from smoking.,    breath sounds clear to auscultation       Cardiovascular + dysrhythmias  Rhythm:Regular Rate:Normal     Neuro/Psych    GI/Hepatic Neg liver ROS, hiatal hernia, GERD  ,  Endo/Other  diabetes  Renal/GU negative Renal ROS     Musculoskeletal   Abdominal   Peds  Hematology   Anesthesia Other Findings   Reproductive/Obstetrics                            Anesthesia Physical Anesthesia Plan  ASA: III  Anesthesia Plan: General   Post-op Pain Management:    Induction: Intravenous  PONV Risk Score and Plan: 2 and Ondansetron, Dexamethasone and TIVA  Airway Management Planned: Double Lumen EBT  Additional Equipment: Arterial line and CVP  Intra-op Plan:   Post-operative Plan: Possible Post-op intubation/ventilation  Informed Consent: I have reviewed the patients History and Physical, chart, labs and discussed the procedure including the risks, benefits and alternatives for the proposed anesthesia with the patient or authorized representative who has indicated his/her understanding and acceptance.       Plan Discussed with: Anesthesiologist and CRNA  Anesthesia Plan Comments:        Anesthesia Quick Evaluation

## 2019-09-10 ENCOUNTER — Inpatient Hospital Stay (HOSPITAL_COMMUNITY)
Admission: RE | Admit: 2019-09-10 | Discharge: 2019-09-16 | DRG: 164 | Disposition: A | Discharge status: Home / Self Care | Payer: Medicare Other | Attending: Thoracic Surgery (Cardiothoracic Vascular Surgery) | Admitting: Thoracic Surgery (Cardiothoracic Vascular Surgery)

## 2019-09-10 ENCOUNTER — Inpatient Hospital Stay (HOSPITAL_COMMUNITY): Payer: Medicare Other | Admitting: Anesthesiology

## 2019-09-10 ENCOUNTER — Other Ambulatory Visit: Payer: Self-pay

## 2019-09-10 ENCOUNTER — Encounter (HOSPITAL_COMMUNITY)
Admission: RE | Disposition: A | Discharge status: Home / Self Care | Payer: Self-pay | Source: Home / Self Care | Attending: Thoracic Surgery (Cardiothoracic Vascular Surgery)

## 2019-09-10 ENCOUNTER — Inpatient Hospital Stay (HOSPITAL_COMMUNITY): Payer: Medicare Other

## 2019-09-10 DIAGNOSIS — J9811 Atelectasis: Secondary | ICD-10-CM | POA: Diagnosis not present

## 2019-09-10 DIAGNOSIS — E119 Type 2 diabetes mellitus without complications: Secondary | ICD-10-CM | POA: Diagnosis not present

## 2019-09-10 DIAGNOSIS — Y838 Other surgical procedures as the cause of abnormal reaction of the patient, or of later complication, without mention of misadventure at the time of the procedure: Secondary | ICD-10-CM | POA: Diagnosis not present

## 2019-09-10 DIAGNOSIS — I10 Essential (primary) hypertension: Secondary | ICD-10-CM | POA: Diagnosis present

## 2019-09-10 DIAGNOSIS — R911 Solitary pulmonary nodule: Secondary | ICD-10-CM

## 2019-09-10 DIAGNOSIS — R110 Nausea: Secondary | ICD-10-CM | POA: Diagnosis not present

## 2019-09-10 DIAGNOSIS — E0865 Diabetes mellitus due to underlying condition with hyperglycemia: Secondary | ICD-10-CM | POA: Diagnosis not present

## 2019-09-10 DIAGNOSIS — Z4682 Encounter for fitting and adjustment of non-vascular catheter: Secondary | ICD-10-CM | POA: Diagnosis not present

## 2019-09-10 DIAGNOSIS — Z9889 Other specified postprocedural states: Secondary | ICD-10-CM | POA: Diagnosis not present

## 2019-09-10 DIAGNOSIS — R001 Bradycardia, unspecified: Secondary | ICD-10-CM | POA: Diagnosis not present

## 2019-09-10 DIAGNOSIS — I4510 Unspecified right bundle-branch block: Secondary | ICD-10-CM | POA: Diagnosis not present

## 2019-09-10 DIAGNOSIS — J439 Emphysema, unspecified: Secondary | ICD-10-CM | POA: Diagnosis not present

## 2019-09-10 DIAGNOSIS — I9789 Other postprocedural complications and disorders of the circulatory system, not elsewhere classified: Secondary | ICD-10-CM | POA: Diagnosis not present

## 2019-09-10 DIAGNOSIS — Z7984 Long term (current) use of oral hypoglycemic drugs: Secondary | ICD-10-CM | POA: Diagnosis not present

## 2019-09-10 DIAGNOSIS — I4819 Other persistent atrial fibrillation: Secondary | ICD-10-CM | POA: Diagnosis not present

## 2019-09-10 DIAGNOSIS — Z833 Family history of diabetes mellitus: Secondary | ICD-10-CM | POA: Diagnosis not present

## 2019-09-10 DIAGNOSIS — Z8572 Personal history of non-Hodgkin lymphomas: Secondary | ICD-10-CM

## 2019-09-10 DIAGNOSIS — C3431 Malignant neoplasm of lower lobe, right bronchus or lung: Secondary | ICD-10-CM | POA: Diagnosis not present

## 2019-09-10 DIAGNOSIS — F329 Major depressive disorder, single episode, unspecified: Secondary | ICD-10-CM | POA: Diagnosis present

## 2019-09-10 DIAGNOSIS — Z79899 Other long term (current) drug therapy: Secondary | ICD-10-CM

## 2019-09-10 DIAGNOSIS — E785 Hyperlipidemia, unspecified: Secondary | ICD-10-CM | POA: Diagnosis present

## 2019-09-10 DIAGNOSIS — J939 Pneumothorax, unspecified: Secondary | ICD-10-CM | POA: Diagnosis not present

## 2019-09-10 DIAGNOSIS — I97191 Other postprocedural cardiac functional disturbances following other surgery: Secondary | ICD-10-CM | POA: Diagnosis not present

## 2019-09-10 DIAGNOSIS — Z8249 Family history of ischemic heart disease and other diseases of the circulatory system: Secondary | ICD-10-CM

## 2019-09-10 DIAGNOSIS — Z85118 Personal history of other malignant neoplasm of bronchus and lung: Secondary | ICD-10-CM

## 2019-09-10 DIAGNOSIS — J9382 Other air leak: Secondary | ICD-10-CM | POA: Diagnosis not present

## 2019-09-10 DIAGNOSIS — E7800 Pure hypercholesterolemia, unspecified: Secondary | ICD-10-CM | POA: Diagnosis not present

## 2019-09-10 DIAGNOSIS — F17210 Nicotine dependence, cigarettes, uncomplicated: Secondary | ICD-10-CM | POA: Diagnosis present

## 2019-09-10 DIAGNOSIS — I959 Hypotension, unspecified: Secondary | ICD-10-CM | POA: Diagnosis not present

## 2019-09-10 DIAGNOSIS — J90 Pleural effusion, not elsewhere classified: Secondary | ICD-10-CM | POA: Diagnosis not present

## 2019-09-10 DIAGNOSIS — R112 Nausea with vomiting, unspecified: Secondary | ICD-10-CM | POA: Diagnosis not present

## 2019-09-10 DIAGNOSIS — I480 Paroxysmal atrial fibrillation: Secondary | ICD-10-CM | POA: Diagnosis not present

## 2019-09-10 DIAGNOSIS — K219 Gastro-esophageal reflux disease without esophagitis: Secondary | ICD-10-CM | POA: Diagnosis present

## 2019-09-10 DIAGNOSIS — Z902 Acquired absence of lung [part of]: Secondary | ICD-10-CM

## 2019-09-10 HISTORY — PX: LYMPH NODE DISSECTION: SHX5087

## 2019-09-10 HISTORY — PX: VIDEO ASSISTED THORACOSCOPY (VATS)/ LOBECTOMY: SHX6169

## 2019-09-10 HISTORY — PX: INTERCOSTAL NERVE BLOCK: SHX5021

## 2019-09-10 LAB — BLOOD GAS, ARTERIAL
Acid-Base Excess: 2.3 mmol/L — ABNORMAL HIGH (ref 0.0–2.0)
Bicarbonate: 27.4 mmol/L (ref 20.0–28.0)
FIO2: 28
O2 Saturation: 98 %
Patient temperature: 37
pCO2 arterial: 50.9 mmHg — ABNORMAL HIGH (ref 32.0–48.0)
pH, Arterial: 7.35 (ref 7.350–7.450)
pO2, Arterial: 114 mmHg — ABNORMAL HIGH (ref 83.0–108.0)

## 2019-09-10 LAB — GLUCOSE, CAPILLARY
Glucose-Capillary: 146 mg/dL — ABNORMAL HIGH (ref 70–99)
Glucose-Capillary: 162 mg/dL — ABNORMAL HIGH (ref 70–99)
Glucose-Capillary: 203 mg/dL — ABNORMAL HIGH (ref 70–99)
Glucose-Capillary: 141 mg/dL — ABNORMAL HIGH (ref 70–99)
Glucose-Capillary: 202 mg/dL — ABNORMAL HIGH (ref 70–99)

## 2019-09-10 SURGERY — VIDEO ASSISTED THORACOSCOPY (VATS)/ LOBECTOMY
Anesthesia: General | Site: Chest | Laterality: Right

## 2019-09-10 MED ORDER — ROCURONIUM BROMIDE 10 MG/ML (PF) SYRINGE
PREFILLED_SYRINGE | INTRAVENOUS | Status: DC | PRN
  Administered 2019-09-10: 50 mg via INTRAVENOUS
  Administered 2019-09-10: 20 mg via INTRAVENOUS
  Administered 2019-09-10: 30 mg via INTRAVENOUS

## 2019-09-10 MED ORDER — SUGAMMADEX SODIUM 200 MG/2ML IV SOLN
INTRAVENOUS | Status: DC | PRN
  Administered 2019-09-10: 200 mg via INTRAVENOUS

## 2019-09-10 MED ORDER — SENNOSIDES-DOCUSATE SODIUM 8.6-50 MG PO TABS
1.0000 | ORAL_TABLET | Freq: Every day | ORAL | Status: DC
  Administered 2019-09-11 – 2019-09-14 (×4): 1 via ORAL
  Filled 2019-09-10 (×5): qty 1

## 2019-09-10 MED ORDER — FENTANYL CITRATE (PF) 100 MCG/2ML IJ SOLN
INTRAMUSCULAR | Status: DC | PRN
  Administered 2019-09-10: 50 ug via INTRAVENOUS
  Administered 2019-09-10: 100 ug via INTRAVENOUS
  Administered 2019-09-10 (×2): 50 ug via INTRAVENOUS
  Administered 2019-09-10: 100 ug via INTRAVENOUS

## 2019-09-10 MED ORDER — DIPHENHYDRAMINE HCL 12.5 MG/5ML PO ELIX
12.5000 mg | ORAL_SOLUTION | Freq: Four times a day (QID) | ORAL | Status: DC | PRN
  Filled 2019-09-10: qty 5

## 2019-09-10 MED ORDER — HYDROMORPHONE 1 MG/ML IV SOLN
INTRAVENOUS | Status: AC
  Administered 2019-09-10: 0.6 mg via INTRAVENOUS
  Administered 2019-09-10: 30 mg via INTRAVENOUS
  Administered 2019-09-10: 1.2 mg via INTRAVENOUS
  Administered 2019-09-11: 0.9 mg via INTRAVENOUS
  Administered 2019-09-11: 0.3 mg via INTRAVENOUS
  Administered 2019-09-11: 2.1 mg via INTRAVENOUS
  Administered 2019-09-11: 0.6 mg via INTRAVENOUS
  Administered 2019-09-11: 0 mg via INTRAVENOUS
  Administered 2019-09-11: 1.2 mg via INTRAVENOUS
  Administered 2019-09-12: 0.9 mg via INTRAVENOUS
  Administered 2019-09-12 (×2): 0.6 mg via INTRAVENOUS
  Administered 2019-09-13: 0.3 mg via INTRAVENOUS
  Administered 2019-09-13: 0.9 mg via INTRAVENOUS
  Administered 2019-09-13: 1.2 mg via INTRAVENOUS

## 2019-09-10 MED ORDER — ONDANSETRON HCL 4 MG/2ML IJ SOLN
4.0000 mg | Freq: Four times a day (QID) | INTRAMUSCULAR | Status: DC | PRN
  Administered 2019-09-12 – 2019-09-16 (×4): 4 mg via INTRAVENOUS
  Filled 2019-09-10 (×5): qty 2

## 2019-09-10 MED ORDER — LACTATED RINGERS IV SOLN
INTRAVENOUS | Status: DC | PRN
  Administered 2019-09-10 (×3): via INTRAVENOUS

## 2019-09-10 MED ORDER — ONDANSETRON HCL 4 MG/2ML IJ SOLN
INTRAMUSCULAR | Status: AC
  Filled 2019-09-10: qty 2

## 2019-09-10 MED ORDER — DIPHENHYDRAMINE HCL 50 MG/ML IJ SOLN: 12.5000 mg | Freq: Four times a day (QID) | INTRAMUSCULAR | Status: DC | PRN

## 2019-09-10 MED ORDER — IPRATROPIUM-ALBUTEROL 0.5-2.5 (3) MG/3ML IN SOLN
3.0000 mL | Freq: Three times a day (TID) | RESPIRATORY_TRACT | Status: DC
  Administered 2019-09-11 – 2019-09-14 (×10): 3 mL via RESPIRATORY_TRACT
  Filled 2019-09-10 (×10): qty 3

## 2019-09-10 MED ORDER — BUPIVACAINE LIPOSOME 1.3 % IJ SUSP
INTRAMUSCULAR | Status: DC | PRN
  Administered 2019-09-10: 100 mL

## 2019-09-10 MED ORDER — CEFAZOLIN SODIUM-DEXTROSE 2-4 GM/100ML-% IV SOLN
2.0000 g | INTRAVENOUS | Status: AC
  Administered 2019-09-10: 2 g via INTRAVENOUS
  Filled 2019-09-10: qty 100

## 2019-09-10 MED ORDER — ACETAMINOPHEN 500 MG PO TABS
1000.0000 mg | ORAL_TABLET | Freq: Four times a day (QID) | ORAL | Status: AC
  Administered 2019-09-10 – 2019-09-14 (×14): 1000 mg via ORAL
  Filled 2019-09-10 (×17): qty 2

## 2019-09-10 MED ORDER — HYDROMORPHONE 1 MG/ML IV SOLN
INTRAVENOUS | Status: AC
  Filled 2019-09-10: qty 30

## 2019-09-10 MED ORDER — PROPOFOL 10 MG/ML IV BOLUS
INTRAVENOUS | Status: AC
  Filled 2019-09-10: qty 20

## 2019-09-10 MED ORDER — LIDOCAINE 2% (20 MG/ML) 5 ML SYRINGE
INTRAMUSCULAR | Status: DC | PRN
  Administered 2019-09-10: 100 mg via INTRAVENOUS

## 2019-09-10 MED ORDER — INSULIN ASPART 100 UNIT/ML ~~LOC~~ SOLN
0.0000 [IU] | SUBCUTANEOUS | Status: DC
  Administered 2019-09-10 (×2): 8 [IU] via SUBCUTANEOUS
  Administered 2019-09-11 (×2): 2 [IU] via SUBCUTANEOUS

## 2019-09-10 MED ORDER — PROPOFOL 10 MG/ML IV BOLUS
INTRAVENOUS | Status: DC | PRN
  Administered 2019-09-10: 70 mg via INTRAVENOUS
  Administered 2019-09-10: 90 mg via INTRAVENOUS

## 2019-09-10 MED ORDER — CITALOPRAM HYDROBROMIDE 20 MG PO TABS
20.0000 mg | ORAL_TABLET | Freq: Every day | ORAL | Status: DC
  Administered 2019-09-10 – 2019-09-16 (×7): 20 mg via ORAL
  Filled 2019-09-10 (×7): qty 1

## 2019-09-10 MED ORDER — HYDROMORPHONE HCL 1 MG/ML IJ SOLN
INTRAMUSCULAR | Status: AC
  Filled 2019-09-10: qty 1

## 2019-09-10 MED ORDER — NALOXONE HCL 0.4 MG/ML IJ SOLN: 0.4000 mg | INTRAMUSCULAR | Status: DC | PRN

## 2019-09-10 MED ORDER — BUPIVACAINE HCL (PF) 0.5 % IJ SOLN
INTRAMUSCULAR | Status: AC
  Filled 2019-09-10: qty 30

## 2019-09-10 MED ORDER — ONDANSETRON HCL 4 MG/2ML IJ SOLN
INTRAMUSCULAR | Status: DC | PRN
  Administered 2019-09-10: 4 mg via INTRAVENOUS

## 2019-09-10 MED ORDER — CEFAZOLIN SODIUM-DEXTROSE 2-4 GM/100ML-% IV SOLN
2.0000 g | Freq: Three times a day (TID) | INTRAVENOUS | Status: AC
  Administered 2019-09-10 (×2): 2 g via INTRAVENOUS
  Filled 2019-09-10 (×2): qty 100

## 2019-09-10 MED ORDER — PHENYLEPHRINE HCL-NACL 10-0.9 MG/250ML-% IV SOLN
INTRAVENOUS | Status: DC | PRN
  Administered 2019-09-10: 25 ug/min via INTRAVENOUS
  Administered 2019-09-10: 5 ug/min via INTRAVENOUS

## 2019-09-10 MED ORDER — FENTANYL CITRATE (PF) 250 MCG/5ML IJ SOLN
INTRAMUSCULAR | Status: AC
  Filled 2019-09-10: qty 5

## 2019-09-10 MED ORDER — SIMVASTATIN 20 MG PO TABS
20.0000 mg | ORAL_TABLET | Freq: Every day | ORAL | Status: DC
  Administered 2019-09-10 – 2019-09-13 (×4): 20 mg via ORAL
  Filled 2019-09-10 (×4): qty 1

## 2019-09-10 MED ORDER — OXYCODONE HCL 5 MG PO TABS
5.0000 mg | ORAL_TABLET | ORAL | Status: DC | PRN
  Administered 2019-09-11: 10 mg via ORAL
  Filled 2019-09-10: qty 2

## 2019-09-10 MED ORDER — SODIUM CHLORIDE 0.9% FLUSH: 9.0000 mL | INTRAVENOUS | Status: DC | PRN

## 2019-09-10 MED ORDER — SODIUM CHLORIDE 0.9 % IV SOLN
INTRAVENOUS | Status: DC
  Administered 2019-09-10: 16:00:00 via INTRAVENOUS

## 2019-09-10 MED ORDER — DEXAMETHASONE SODIUM PHOSPHATE 10 MG/ML IJ SOLN
INTRAMUSCULAR | Status: AC
  Filled 2019-09-10: qty 1

## 2019-09-10 MED ORDER — PHENYLEPHRINE 40 MCG/ML (10ML) SYRINGE FOR IV PUSH (FOR BLOOD PRESSURE SUPPORT)
PREFILLED_SYRINGE | INTRAVENOUS | Status: DC | PRN
  Administered 2019-09-10: 40 ug via INTRAVENOUS

## 2019-09-10 MED ORDER — ROCURONIUM BROMIDE 10 MG/ML (PF) SYRINGE
PREFILLED_SYRINGE | INTRAVENOUS | Status: AC
  Filled 2019-09-10: qty 10

## 2019-09-10 MED ORDER — DEXAMETHASONE SODIUM PHOSPHATE 10 MG/ML IJ SOLN
INTRAMUSCULAR | Status: DC | PRN
  Administered 2019-09-10: 4 mg via INTRAVENOUS

## 2019-09-10 MED ORDER — CHLORHEXIDINE GLUCONATE CLOTH 2 % EX PADS
6.0000 | MEDICATED_PAD | Freq: Every day | CUTANEOUS | Status: DC
  Administered 2019-09-10 – 2019-09-14 (×4): 6 via TOPICAL

## 2019-09-10 MED ORDER — ONDANSETRON HCL 4 MG/2ML IJ SOLN: 4.0000 mg | Freq: Four times a day (QID) | INTRAMUSCULAR | Status: DC | PRN

## 2019-09-10 MED ORDER — ASPIRIN EC 81 MG PO TBEC
81.0000 mg | DELAYED_RELEASE_TABLET | Freq: Every day | ORAL | Status: DC
  Administered 2019-09-10 – 2019-09-13 (×4): 81 mg via ORAL
  Filled 2019-09-10 (×4): qty 1

## 2019-09-10 MED ORDER — HYDRALAZINE HCL 20 MG/ML IJ SOLN
INTRAMUSCULAR | Status: DC | PRN
  Administered 2019-09-10 (×2): 5 mg via INTRAVENOUS

## 2019-09-10 MED ORDER — BISACODYL 5 MG PO TBEC
10.0000 mg | DELAYED_RELEASE_TABLET | Freq: Every day | ORAL | Status: DC
  Administered 2019-09-10 – 2019-09-13 (×4): 10 mg via ORAL
  Filled 2019-09-10 (×6): qty 2

## 2019-09-10 MED ORDER — LIDOCAINE 2% (20 MG/ML) 5 ML SYRINGE
INTRAMUSCULAR | Status: AC
  Filled 2019-09-10: qty 5

## 2019-09-10 MED ORDER — EPHEDRINE SULFATE-NACL 50-0.9 MG/10ML-% IV SOSY
PREFILLED_SYRINGE | INTRAVENOUS | Status: DC | PRN
  Administered 2019-09-10 (×2): 10 mg via INTRAVENOUS
  Administered 2019-09-10: 5 mg via INTRAVENOUS

## 2019-09-10 MED ORDER — MIDAZOLAM HCL 2 MG/2ML IJ SOLN
INTRAMUSCULAR | Status: AC
  Filled 2019-09-10: qty 2

## 2019-09-10 MED ORDER — SODIUM CHLORIDE 0.9 % IV SOLN: INTRAVENOUS | Status: DC | PRN

## 2019-09-10 MED ORDER — HYDROMORPHONE HCL 1 MG/ML IJ SOLN
0.2500 mg | INTRAMUSCULAR | Status: DC | PRN
  Administered 2019-09-10: 0.25 mg via INTRAVENOUS
  Administered 2019-09-10: 0.5 mg via INTRAVENOUS

## 2019-09-10 MED ORDER — 0.9 % SODIUM CHLORIDE (POUR BTL) OPTIME
TOPICAL | Status: DC | PRN
  Administered 2019-09-10: 2000 mL

## 2019-09-10 MED ORDER — MIDAZOLAM HCL 5 MG/5ML IJ SOLN
INTRAMUSCULAR | Status: DC | PRN
  Administered 2019-09-10: 1 mg via INTRAVENOUS

## 2019-09-10 MED ORDER — HEMOSTATIC AGENTS (NO CHARGE) OPTIME
TOPICAL | Status: DC | PRN
  Administered 2019-09-10: 1 via TOPICAL

## 2019-09-10 MED ORDER — TRAMADOL HCL 50 MG PO TABS
50.0000 mg | ORAL_TABLET | Freq: Four times a day (QID) | ORAL | Status: DC | PRN
  Administered 2019-09-10 (×2): 50 mg via ORAL
  Filled 2019-09-10 (×2): qty 1

## 2019-09-10 MED ORDER — ACETAMINOPHEN 160 MG/5ML PO SOLN: 1000.0000 mg | Freq: Four times a day (QID) | ORAL | Status: AC

## 2019-09-10 MED ORDER — IPRATROPIUM-ALBUTEROL 0.5-2.5 (3) MG/3ML IN SOLN
3.0000 mL | Freq: Four times a day (QID) | RESPIRATORY_TRACT | Status: DC
  Administered 2019-09-10 (×2): 3 mL via RESPIRATORY_TRACT
  Filled 2019-09-10 (×2): qty 3

## 2019-09-10 SURGICAL SUPPLY — 87 items
APPLIER CLIP ROT 10 11.4 M/L (STAPLE)
CANISTER SUCT 3000ML PPV (MISCELLANEOUS) ×4 IMPLANT
CATH THORACIC 28FR (CATHETERS) ×4 IMPLANT
CATH THORACIC 28FR RT ANG (CATHETERS) IMPLANT
CATH THORACIC 36FR (CATHETERS) IMPLANT
CATH THORACIC 36FR RT ANG (CATHETERS) IMPLANT
CLIP APPLIE ROT 10 11.4 M/L (STAPLE) IMPLANT
CLIP VESOCCLUDE MED 6/CT (CLIP) ×4 IMPLANT
CONN ST 1/4X3/8  BEN (MISCELLANEOUS)
CONN ST 1/4X3/8 BEN (MISCELLANEOUS) IMPLANT
CONN Y 3/8X3/8X3/8  BEN (MISCELLANEOUS)
CONN Y 3/8X3/8X3/8 BEN (MISCELLANEOUS) IMPLANT
CONT SPEC 4OZ CLIKSEAL STRL BL (MISCELLANEOUS) ×56 IMPLANT
COVER SURGICAL LIGHT HANDLE (MISCELLANEOUS) IMPLANT
CUTTER ECHEON FLEX ENDO 45 340 (ENDOMECHANICALS) ×4 IMPLANT
DEFOGGER SCOPE WARMER CLEARIFY (MISCELLANEOUS) IMPLANT
DERMABOND ADVANCED (GAUZE/BANDAGES/DRESSINGS) ×2
DERMABOND ADVANCED .7 DNX12 (GAUZE/BANDAGES/DRESSINGS) ×2 IMPLANT
DRAIN CHANNEL 28F RND 3/8 FF (WOUND CARE) IMPLANT
DRAIN CHANNEL 32F RND 10.7 FF (WOUND CARE) IMPLANT
DRAPE CV SPLIT W-CLR ANES SCRN (DRAPES) ×4 IMPLANT
DRAPE ORTHO SPLIT 77X108 STRL (DRAPES) ×2
DRAPE SURG ORHT 6 SPLT 77X108 (DRAPES) ×2 IMPLANT
ELECT BLADE 6.5 EXT (BLADE) ×4 IMPLANT
ELECT REM PT RETURN 9FT ADLT (ELECTROSURGICAL) ×4
ELECTRODE REM PT RTRN 9FT ADLT (ELECTROSURGICAL) ×2 IMPLANT
GAUZE SPONGE 4X4 12PLY STRL (GAUZE/BANDAGES/DRESSINGS) ×4 IMPLANT
GLOVE SURG SIGNA 7.5 PF LTX (GLOVE) ×8 IMPLANT
GOWN STRL REUS W/ TWL LRG LVL3 (GOWN DISPOSABLE) ×6 IMPLANT
GOWN STRL REUS W/ TWL XL LVL3 (GOWN DISPOSABLE) ×2 IMPLANT
GOWN STRL REUS W/TWL LRG LVL3 (GOWN DISPOSABLE) ×6
GOWN STRL REUS W/TWL XL LVL3 (GOWN DISPOSABLE) ×2
HEMOSTAT SURGICEL 2X14 (HEMOSTASIS) ×4 IMPLANT
KIT BASIN OR (CUSTOM PROCEDURE TRAY) ×4 IMPLANT
KIT SUCTION CATH 14FR (SUCTIONS) IMPLANT
KIT TURNOVER KIT B (KITS) ×4 IMPLANT
NEEDLE HYPO 25GX1X1/2 BEV (NEEDLE) ×4 IMPLANT
NEEDLE SPNL 18GX3.5 QUINCKE PK (NEEDLE) IMPLANT
NS IRRIG 1000ML POUR BTL (IV SOLUTION) ×8 IMPLANT
PACK CHEST (CUSTOM PROCEDURE TRAY) ×4 IMPLANT
PAD ARMBOARD 7.5X6 YLW CONV (MISCELLANEOUS) ×4 IMPLANT
POUCH ENDO CATCH II 15MM (MISCELLANEOUS) ×4 IMPLANT
POUCH SPECIMEN RETRIEVAL 10MM (ENDOMECHANICALS) IMPLANT
SCISSORS LAP 5X35 DISP (ENDOMECHANICALS) IMPLANT
SEALANT PROGEL (MISCELLANEOUS) ×4 IMPLANT
SEALANT SURG COSEAL 4ML (VASCULAR PRODUCTS) IMPLANT
SEALANT SURG COSEAL 8ML (VASCULAR PRODUCTS) IMPLANT
SHEARS HARMONIC HDI 20CM (ELECTROSURGICAL) ×4 IMPLANT
SOL ANTI FOG 6CC (MISCELLANEOUS) ×2 IMPLANT
SOLUTION ANTI FOG 6CC (MISCELLANEOUS) ×2
SPECIMEN JAR MEDIUM (MISCELLANEOUS) IMPLANT
SPONGE INTESTINAL PEANUT (DISPOSABLE) IMPLANT
SPONGE TONSIL TAPE 1 RFD (DISPOSABLE) ×4 IMPLANT
STAPLE RELOAD 2.5MM WHITE (STAPLE) ×20 IMPLANT
STAPLE RELOAD 45 GRN (STAPLE) ×2 IMPLANT
STAPLE RELOAD 45MM GOLD (STAPLE) ×28 IMPLANT
STAPLE RELOAD 45MM GREEN (STAPLE) ×2
STAPLER VASCULAR ECHELON 35 (CUTTER) ×4 IMPLANT
STOPCOCK 4 WAY LG BORE MALE ST (IV SETS) ×4 IMPLANT
SUT PDS AB 3-0 SH 27 (SUTURE) IMPLANT
SUT PROLENE 4 0 RB 1 (SUTURE)
SUT PROLENE 4-0 RB1 .5 CRCL 36 (SUTURE) IMPLANT
SUT SILK  1 MH (SUTURE) ×2
SUT SILK 1 MH (SUTURE) ×2 IMPLANT
SUT SILK 2 0 SH (SUTURE) ×4 IMPLANT
SUT SILK 2 0SH CR/8 30 (SUTURE) IMPLANT
SUT SILK 3 0 SH 30 (SUTURE) IMPLANT
SUT SILK 3 0SH CR/8 30 (SUTURE) IMPLANT
SUT VIC AB 1 CTX 36 (SUTURE) ×2
SUT VIC AB 1 CTX36XBRD ANBCTR (SUTURE) ×2 IMPLANT
SUT VIC AB 2-0 CTX 36 (SUTURE) ×4 IMPLANT
SUT VIC AB 3-0 MH 27 (SUTURE) IMPLANT
SUT VIC AB 3-0 X1 27 (SUTURE) ×4 IMPLANT
SUT VICRYL 2 TP 1 (SUTURE) IMPLANT
SYR 10ML LL (SYRINGE) ×4 IMPLANT
SYR 30ML LL (SYRINGE) ×4 IMPLANT
SYR 50ML LL SCALE MARK (SYRINGE) ×4 IMPLANT
SYSTEM SAHARA CHEST DRAIN ATS (WOUND CARE) ×4 IMPLANT
TAPE CLOTH 4X10 WHT NS (GAUZE/BANDAGES/DRESSINGS) ×4 IMPLANT
TAPE CLOTH SURG 4X10 WHT LF (GAUZE/BANDAGES/DRESSINGS) ×4 IMPLANT
TIP APPLICATOR SPRAY EXTEND 16 (VASCULAR PRODUCTS) IMPLANT
TOWEL GREEN STERILE (TOWEL DISPOSABLE) ×4 IMPLANT
TOWEL GREEN STERILE FF (TOWEL DISPOSABLE) ×4 IMPLANT
TRAY FOLEY MTR SLVR 16FR STAT (SET/KITS/TRAYS/PACK) ×4 IMPLANT
TROCAR XCEL BLADELESS 5X75MML (TROCAR) ×4 IMPLANT
TUBING EXTENTION W/L.L. (IV SETS) ×4 IMPLANT
WATER STERILE IRR 1000ML POUR (IV SOLUTION) ×4 IMPLANT

## 2019-09-10 NOTE — Brief Op Note (Signed)
09/10/2019  10:39 AM  PATIENT:  Monica [name]  78 y.o. female  PRE-OPERATIVE DIAGNOSIS:  Right Lower Lobe NODULE  POST-OPERATIVE DIAGNOSIS:  Right Lower Lobe NODULE  PROCEDURE:  Procedure(s): VIDEO ASSISTED THORACOSCOPY (VATS)/RIGHT LOWER LOBECTOMY (Right) Lymph Node Dissection (Right) Intercostal Nerve Block (Right)  SURGEON:  Surgeon(s) and Role:    * [name], MD - Primary  PHYSICIAN ASSISTANT: Lesleyann Fichter   ANESTHESIA:   general  EBL:  75 mL   BLOOD ADMINISTERED:none  DRAINS: 40fr right pleural tube   LOCAL MEDICATIONS USED:  BUPIVICAINE   SPECIMEN:  Source of Specimen:  Right lower lung lobe  DISPOSITION OF SPECIMEN:  PATHOLOGY  COUNTS:  YES  DICTATION: .Dragon Dictation  PLAN OF CARE: Admit to inpatient   PATIENT DISPOSITION:  PACU - hemodynamically stable.   Delay start of Pharmacological VTE agent (>24hrs) due to surgical blood loss or risk of bleeding: no

## 2019-09-10 NOTE — Op Note (Signed)
NAME: Monica [name], JUSTEN MEDICAL RECORD AO:[id number] ACCOUNT [id number] DATE OF BIRTH:[birth year] FACILITY: [facility] LOCATION: [facility] PHYSICIAN:STEVEN Chaya Jan, MD  OPERATIVE REPORT  DATE OF PROCEDURE:  09/10/2019  PREOPERATIVE DIAGNOSIS:  Right lower lobe lung nodule.  POSTOPERATIVE DIAGNOSIS:  Nonsmall cell carcinoma, right lower lobe, clinical stage IIA (T1, N1).  PROCEDURES:   1.  Right video-assisted thoracoscopy.  2.  Thoracoscopic right lower lobectomy.  3.  Mediastinal lymph node dissection 4.  Intercostal nerve blocks at levels 3 through 10.  SURGEON:  Modesto Charon, MD  ASSISTANT:  Enid Cutter, PA.  ANESTHESIA:  General.  FINDINGS:  Mass in right lower lobe.  Frozen section revealed nonsmall cell carcinoma.  Bronchial margins free of tumor.  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  She recently had a PET CT which showed a new 9 mm lung nodule.  On repeat, the nodule had increased to 1.8 cm  and was hypermetabolic.  There was a questionable hypermetabolic level 11 lymph node.  She was offered the option of biopsy and referral for radiation versus surgical resection.  She knew this would be a relatively greater loss of pulmonary function than  her previous resection.  She understood and accepted the risks and wished to proceed with surgery.  OPERATIVE NOTE:  Monica [name] was brought to the preoperative holding area on 09/10/2019.  Anesthesia placed a central venous catheter and an arterial blood pressure monitoring line.  She was taken to the operating room, anesthetized and intubated with a  double lumen endotracheal tube.  Intravenous antibiotics were administered.  A Foley catheter was placed.  Sequential compression devices were placed on the calves for DVT prophylaxis.  She was placed in a left lateral decubitus position and the right  chest was prepped and  draped in the usual sterile fashion.  A timeout was performed.  A solution containing 20 mL of liposomal bupivacaine, 30 mL of 0.5% bupivacaine and 50 mL of saline was prepared.  It was used for the intercostal nerve blocks, as well as local at the incisions, which were injected before  making the incision.  An incision was made in the 8th interspace in the midaxillary line.  A 5 mm port was inserted.  The thoracoscope was advanced into the chest.  There was good isolation of the right lung, although it was relatively slow to deflate.  There was no  abnormality of the visceral or parietal pleura and no pleural effusion.  A 5 cm working incision was made in the 4th interspace anterolaterally.  No rib spreading was performed during the procedure.  The inferior ligament was divided.  A level 9 node was  removed.  All lymph nodes that were removed were sent as separate specimens for permanent pathology.  The pleural reflection was divided at the hilum posteriorly and a relatively large, but otherwise benign-appearing level 7 node was removed.  The  pleural reflection was divided anteriorly.  The phrenic nerve was in relatively close proximity and no cautery or Harmonic scalpel was used in the vicinity of the phrenic nerve.  The fissure between the middle lobe and lower lobe then was completed.  The  pleura overlying the basilar segmental branch of the pulmonary artery was incised.  A relatively large, but otherwise benign-appearing level 12 node was removed.  This allowed the division of the parenchyma in the fissure.  The dissection then  was  carried over top of the pulmonary artery.  The fissure was very incomplete at the confluence in between the superior segment and the upper lobe.  Multiple firings of an Echelon 45 mm stapler with gold cartridges was used to complete the fissure.  The  basilar segmental branch of the pulmonary artery then was freed up.  Next, the inferior pulmonary vein was encircled and  divided with the endoscopic vascular stapler.  The basilar segmental branch of the pulmonary artery was divided with the vascular  stapler.  Nodes were dissected off the superior segmental branch and it was then divided with the vascular stapler.  Multiple lymph nodes were removed during the dissection of the fissure.  An Echelon stapler with a green cartridge then was placed across  the origin of the right lower lobe bronchus at its origin and closed.  A test inflation showed good aeration of the upper and middle lobes.  The stapler was fired, transecting the bronchus. The lower lobe was placed into an endoscopic retrieval bag  and removed.  It was sent for frozen section of the mass and the bronchial margin.  The pleura overlying the mediastinum then was incised just superior to the azygos vein.  Level 4R and 2R nodes were identified and were removed.  No cautery was used in  the vicinity of the phrenic or recurrent nerves.  The chest was irrigated with warm saline.  A test inflation to 30 cm of water revealed no leakage from the bronchial stump, but there was some leakage from the fissure posteriorly.  Progel was applied to  this area.  Final inspection was made for hemostasis.  A 28-French chest tube was placed through the original port incision and secured with a #1 silk suture.  Dual-lung ventilation was resumed.  The frozen section returned showing nonsmall cell  carcinoma.  The bronchial margin was clear.  The working incision then was closed in 3 layers.  Dermabond was applied.  The chest tube was placed to suction.  The patient was placed back in the supine position.  She then was extubated in the operating  room and taken to the postanesthetic care unit in good condition.  VN/NUANCE  D:09/10/2019 T:09/10/2019 JOB:[id number]

## 2019-09-10 NOTE — Anesthesia Procedure Notes (Addendum)
Central Venous Catheter Insertion Performed by: Belinda Block, MD, anesthesiologist Start/End11/07/2019 6:50 AM, 09/10/2019 7:05 AM Patient location: Pre-op. Preanesthetic checklist: patient identified, IV checked, site marked, risks and benefits discussed, surgical consent, monitors and equipment checked, pre-op evaluation and timeout performed Position: Trendelenburg Patient sedated Hand hygiene performed , maximum sterile barriers used  and Seldinger technique used Central line was placed.Double lumen Procedure performed using ultrasound guided technique. Ultrasound Notes:anatomy identified and image(s) printed for medical record Attempts: 1 Following insertion, line sutured, dressing applied and Biopatch. Post procedure assessment: blood return through all ports  Patient tolerated the procedure well with no immediate complications.

## 2019-09-10 NOTE — Anesthesia Procedure Notes (Signed)
Procedure Name: Intubation Date/Time: 09/10/2019 7:45 AM Performed by: Lowella Dell, CRNA Pre-anesthesia Checklist: Patient identified, Emergency Drugs available, Suction available and Patient being monitored Patient Re-evaluated:Patient Re-evaluated prior to induction Oxygen Delivery Method: Circle System Utilized Preoxygenation: Pre-oxygenation with 100% oxygen Induction Type: IV induction Ventilation: Mask ventilation without difficulty and Oral airway inserted - appropriate to patient size Laryngoscope Size: Mac and 3 Grade View: Grade I Tube type: Oral Endobronchial tube: Double lumen EBT, EBT position confirmed by fiberoptic bronchoscope and EBT position confirmed by auscultation and 37 Fr Number of attempts: 2 (attempt #1 by SRNA, #2 by CRNA without difficulty) Airway Equipment and Method: Stylet and Oral airway Placement Confirmation: ETT inserted through vocal cords under direct vision,  positive ETCO2 and breath sounds checked- equal and bilateral Secured at: 29 cm Tube secured with: Tape Dental Injury: Teeth and Oropharynx as per pre-operative assessment  Comments: DL #1 by Johna Sheriff. DL x2 by Bethann Humble CRNA, DLT advanced atraumatically without difficulty. Grade 1 view with Mac 3. Easy MV between airway maneuvers with OPA in place.

## 2019-09-10 NOTE — Transfer of Care (Signed)
Immediate Anesthesia Transfer of Care Note  Patient: Monica [name]  Procedure(s) Performed: VIDEO ASSISTED THORACOSCOPY (VATS)/RIGHT LOWER LOBECTOMY (Right Chest) Lymph Node Dissection (Right ) Intercostal Nerve Block (Right )  Patient Location: PACU  Anesthesia Type:General  Level of Consciousness: awake and patient cooperative  Airway & Oxygen Therapy: Patient Spontanous Breathing and Patient connected to face mask oxygen  Post-op Assessment: Report given to RN, Post -op Vital signs reviewed and stable and Patient moving all extremities  Post vital signs: Reviewed and stable  Last Vitals:  Vitals Value Taken Time  BP 135/57 09/10/19 1059  Temp    Pulse 77 09/10/19 1104  Resp 17 09/10/19 1104  SpO2 100 % 09/10/19 1104  Vitals shown include unvalidated device data.  Last Pain:  Vitals:   09/10/19 0609  PainSc: 0-No pain      Patients Stated Pain Goal: 3 (64/29/03 7955)  Complications: No apparent anesthesia complications

## 2019-09-10 NOTE — Anesthesia Procedure Notes (Signed)
Arterial Line Insertion Start/End11/07/2019 7:00 AM, 09/10/2019 7:09 AM Performed by: Lowella Dell, CRNA, CRNA  Patient location: Pre-op. Preanesthetic checklist: patient identified, IV checked, site marked, risks and benefits discussed, surgical consent, monitors and equipment checked, pre-op evaluation, timeout performed and anesthesia consent Lidocaine 1% used for infiltration and patient sedated Left, radial was placed Catheter size: 20 G Hand hygiene performed  and maximum sterile barriers used   Attempts: 1 Procedure performed without using ultrasound guided technique. Following insertion, dressing applied and Biopatch. Post procedure assessment: normal  Patient tolerated the procedure well with no immediate complications. Additional procedure comments: Procedure performed by Johna Sheriff, supervised by Bethann Humble CRNA.Marland Kitchen

## 2019-09-10 NOTE — Interval H&P Note (Signed)
History and Physical Interval Note:  09/10/2019 7:19 AM  Monica [name]  has presented today for surgery, with the diagnosis of RLL NODULE.  The various methods of treatment have been discussed with the patient and family. After consideration of risks, benefits and other options for treatment, the patient has consented to  Procedure(s): VIDEO ASSISTED THORACOSCOPY (VATS)/RIGHT LOWER LOBECTOMY (Right) as a surgical intervention.  The patient's history has been reviewed, patient examined, no change in status, stable for surgery.  I have reviewed the patient's chart and labs.  Questions were answered to the patient's satisfaction.     Monica [name]

## 2019-09-10 NOTE — Anesthesia Postprocedure Evaluation (Signed)
Anesthesia Post Note  Patient: Monica [name]  Procedure(s) Performed: VIDEO ASSISTED THORACOSCOPY (VATS)/RIGHT LOWER LOBECTOMY (Right Chest) Lymph Node Dissection (Right ) Intercostal Nerve Block (Right )     Patient location during evaluation: PACU Anesthesia Type: General Level of consciousness: awake Pain management: pain level controlled Vital Signs Assessment: post-procedure vital signs reviewed and stable Respiratory status: spontaneous breathing Cardiovascular status: stable Postop Assessment: no apparent nausea or vomiting Anesthetic complications: no    Last Vitals:  Vitals:   09/10/19 1554 09/10/19 1628  BP:  (!) 111/57  Pulse:  77  Resp: 14   Temp:  36.5 C  SpO2: 95%     Last Pain:  Vitals:   09/10/19 1628  TempSrc: Oral  PainSc:                  Donnalee Cellucci

## 2019-09-11 ENCOUNTER — Inpatient Hospital Stay (HOSPITAL_COMMUNITY): Payer: Medicare Other

## 2019-09-11 ENCOUNTER — Encounter (HOSPITAL_COMMUNITY): Payer: Self-pay | Admitting: Thoracic Surgery (Cardiothoracic Vascular Surgery)

## 2019-09-11 LAB — BASIC METABOLIC PANEL
Anion gap: 11 (ref 5–15)
BUN: 15 mg/dL (ref 8–23)
CO2: 24 mmol/L (ref 22–32)
Calcium: 8.8 mg/dL — ABNORMAL LOW (ref 8.9–10.3)
Chloride: 100 mmol/L (ref 98–111)
Creatinine, Ser: 0.87 mg/dL (ref 0.44–1.00)
GFR calc Af Amer: >60 mL/min (ref >60)
GFR calc non Af Amer: >60 mL/min (ref >60)
Glucose, Bld: 133 mg/dL — ABNORMAL HIGH (ref 70–99)
Potassium: 4.4 mmol/L (ref 3.5–5.1)
Sodium: 135 mmol/L (ref 135–145)

## 2019-09-11 LAB — BLOOD GAS, ARTERIAL
Acid-Base Excess: 0.3 mmol/L (ref 0.0–2.0)
Bicarbonate: 25.5 mmol/L (ref 20.0–28.0)
Drawn by: 511471
FIO2: 36
O2 Saturation: 96.5 %
Patient temperature: 36.7
pCO2 arterial: 49.2 mmHg — ABNORMAL HIGH (ref 32.0–48.0)
pH, Arterial: 7.334 — ABNORMAL LOW (ref 7.350–7.450)
pO2, Arterial: 87.4 mmHg (ref 83.0–108.0)

## 2019-09-11 LAB — GLUCOSE, CAPILLARY
Glucose-Capillary: 119 mg/dL — ABNORMAL HIGH (ref 70–99)
Glucose-Capillary: 132 mg/dL — ABNORMAL HIGH (ref 70–99)
Glucose-Capillary: 147 mg/dL — ABNORMAL HIGH (ref 70–99)
Glucose-Capillary: 154 mg/dL — ABNORMAL HIGH (ref 70–99)
Glucose-Capillary: 189 mg/dL — ABNORMAL HIGH (ref 70–99)
Glucose-Capillary: 190 mg/dL — ABNORMAL HIGH (ref 70–99)

## 2019-09-11 LAB — CBC
HCT: 36.1 % (ref 36.0–46.0)
Hemoglobin: 11.7 g/dL — ABNORMAL LOW (ref 12.0–15.0)
MCH: 29.8 pg (ref 26.0–34.0)
MCHC: 32.4 g/dL (ref 30.0–36.0)
MCV: 91.9 fL (ref 80.0–100.0)
Platelets: 240 10*3/uL (ref 150–400)
RBC: 3.93 MIL/uL (ref 3.87–5.11)
RDW: 15.4 % (ref 11.5–15.5)
WBC: 12.5 10*3/uL — ABNORMAL HIGH (ref 4.0–10.5)
nRBC: 0 % (ref 0.0–0.2)

## 2019-09-11 LAB — SURGICAL PATHOLOGY

## 2019-09-11 MED ORDER — AMIODARONE HCL IN DEXTROSE 360-4.14 MG/200ML-% IV SOLN
60.0000 mg/h | INTRAVENOUS | Status: AC
  Administered 2019-09-11: 60 mg/h via INTRAVENOUS
  Filled 2019-09-11: qty 200

## 2019-09-11 MED ORDER — AMIODARONE HCL IN DEXTROSE 360-4.14 MG/200ML-% IV SOLN
30.0000 mg/h | INTRAVENOUS | Status: AC
  Administered 2019-09-11: 60 mg/h via INTRAVENOUS
  Administered 2019-09-12 – 2019-09-13 (×2): 30 mg/h via INTRAVENOUS
  Filled 2019-09-11 (×7): qty 200

## 2019-09-11 MED ORDER — METFORMIN HCL ER 500 MG PO TB24
500.0000 mg | ORAL_TABLET | Freq: Every day | ORAL | Status: DC
  Administered 2019-09-11 – 2019-09-13 (×3): 500 mg via ORAL
  Filled 2019-09-11 (×3): qty 1

## 2019-09-11 MED ORDER — PIOGLITAZONE HCL 15 MG PO TABS
15.0000 mg | ORAL_TABLET | Freq: Every day | ORAL | Status: DC
  Administered 2019-09-11 – 2019-09-13 (×3): 15 mg via ORAL
  Filled 2019-09-11 (×3): qty 1

## 2019-09-11 MED ORDER — ORAL CARE MOUTH RINSE
15.0000 mL | Freq: Two times a day (BID) | OROMUCOSAL | Status: DC
  Administered 2019-09-12 – 2019-09-15 (×7): 15 mL via OROMUCOSAL

## 2019-09-11 MED ORDER — METOPROLOL TARTRATE 25 MG PO TABS
25.0000 mg | ORAL_TABLET | Freq: Two times a day (BID) | ORAL | Status: DC
  Administered 2019-09-11 – 2019-09-16 (×11): 25 mg via ORAL
  Filled 2019-09-11 (×11): qty 1

## 2019-09-11 MED ORDER — ENOXAPARIN SODIUM 40 MG/0.4ML ~~LOC~~ SOLN
40.0000 mg | SUBCUTANEOUS | Status: DC
  Administered 2019-09-11 – 2019-09-13 (×3): 40 mg via SUBCUTANEOUS
  Filled 2019-09-11 (×3): qty 0.4

## 2019-09-11 MED ORDER — AMIODARONE LOAD VIA INFUSION
150.0000 mg | Freq: Once | INTRAVENOUS | Status: AC
  Administered 2019-09-11: 150 mg via INTRAVENOUS
  Filled 2019-09-11: qty 83.34

## 2019-09-11 MED ORDER — POLYVINYL ALCOHOL 1.4 % OP SOLN
1.0000 [drp] | OPHTHALMIC | Status: DC | PRN
  Filled 2019-09-11: qty 15

## 2019-09-11 MED ORDER — POLYVINYL ALCOHOL-POVIDONE PF 1.4-0.6 % OP SOLN: 1.0000 [drp] | OPHTHALMIC | Status: DC | PRN

## 2019-09-11 MED ORDER — INSULIN ASPART 100 UNIT/ML ~~LOC~~ SOLN
0.0000 [IU] | Freq: Three times a day (TID) | SUBCUTANEOUS | Status: DC
  Administered 2019-09-11 – 2019-09-12 (×5): 3 [IU] via SUBCUTANEOUS
  Administered 2019-09-13 (×2): 2 [IU] via SUBCUTANEOUS
  Administered 2019-09-13 – 2019-09-14 (×2): 3 [IU] via SUBCUTANEOUS
  Administered 2019-09-14 – 2019-09-16 (×4): 2 [IU] via SUBCUTANEOUS

## 2019-09-11 MED ORDER — IPRATROPIUM-ALBUTEROL 0.5-2.5 (3) MG/3ML IN SOLN
3.0000 mL | Freq: Four times a day (QID) | RESPIRATORY_TRACT | Status: DC | PRN
  Administered 2019-09-11 – 2019-09-13 (×3): 3 mL via RESPIRATORY_TRACT
  Filled 2019-09-11 (×3): qty 3

## 2019-09-11 NOTE — Progress Notes (Signed)
1 Day Post-Op Procedure(s) (LRB): VIDEO ASSISTED THORACOSCOPY (VATS)/RIGHT LOWER LOBECTOMY (Right) Lymph Node Dissection (Right) Intercostal Nerve Block (Right) Subjective: A lot of pain overnight, better this AM  Objective: Vital signs in last 24 hours: Temp:  [97.6 F (36.4 C)-98.2 F (36.8 C)] 98.2 F (36.8 C) (11/10 0740) Pulse Rate:  [73-86] 86 (11/10 0740) Cardiac Rhythm: Normal sinus rhythm;Bundle branch block (11/10 0755) Resp:  [11-18] 17 (11/10 0740) BP: (110-136)/(46-67) 127/59 (11/10 0740) SpO2:  [91 %-100 %] 92 % (11/10 0740) Arterial Line BP: (102-123)/(50-55) 123/55 (11/10 0400)  Hemodynamic parameters for last 24 hours:    Intake/Output from previous day: 11/09 0701 - 11/10 0700 In: 1500 [I.V.:1500] Out: 1160 [Urine:550; Blood:100; Chest Tube:510] Intake/Output this shift: No intake/output data recorded.  General appearance: alert, cooperative and no distress Neurologic: intact Heart: regular rate and rhythm Lungs: diminished breath sounds bibasilar Abdomen: normal findings: soft, non-tender + air leak  Lab Results: Recent Labs    09/11/19 0345  WBC 12.5*  HGB 11.7*  HCT 36.1  PLT 240   BMET:  Recent Labs    09/11/19 0345  NA 135  K 4.4  CL 100  CO2 24  GLUCOSE 133*  BUN 15  CREATININE 0.87  CALCIUM 8.8*    PT/INR: No results for input(s): LABPROT, INR in the last 72 hours. ABG    Component Value Date/Time   PHART 7.334 (L) 09/11/2019 0422   HCO3 25.5 09/11/2019 0422   O2SAT 96.5 09/11/2019 0422   CBG (last 3)  Recent Labs    09/11/19 0035 09/11/19 0409 09/11/19 0806  GLUCAP 147* 132* 119*    Assessment/Plan: S/P Procedure(s) (LRB): VIDEO ASSISTED THORACOSCOPY (VATS)/RIGHT LOWER LOBECTOMY (Right) Lymph Node Dissection (Right) Intercostal Nerve Block (Right) - POD # 1 Doing well  CV- in SR, history of postop A fib- will restart lopressor RESP_ s/p lobectomy, good sats, no wheezing  + moderate air leak- will try CT to  water seal RENAL- creatinine normal ENDO- CBG elevated, resume metformin and Actos, continue SSI GI- carb modified diet DVT prophylaxis- SCD + enoxaparin Ambulate    LOS: 1 day    [name] 09/11/2019

## 2019-09-11 NOTE — Progress Notes (Signed)
      [address]       [place] [address]             [phone]       Orders placed for an Amio bolus and drip. Discussed patient with Dr. Roxan Hockey. Lopressor PO given. Will monitor closely.    Nicholes Rough, PA-C

## 2019-09-11 NOTE — Progress Notes (Signed)
Paged provider to advise of patient heart rate 110 -140's Sustaining 110's  showing Afib rhythm.  1323 - paged returned notified PA. Orders will be placed once confirmed with MD. Awaiting orders. RN will continue to monitor.

## 2019-09-11 NOTE — Progress Notes (Addendum)
Patient given bolus of Amiodarone pt bp 82/53 (64) MAP; RN stop continuous gtt and notified PA of low bp advise amiodarone stopped; Awaiting orders.  PA callback advise to continue with gtt as ordered; Goal for patient MAP to maintain over 65. If dropped page PA. RN will continue to monitor.

## 2019-09-12 ENCOUNTER — Inpatient Hospital Stay (HOSPITAL_COMMUNITY): Payer: Medicare Other

## 2019-09-12 ENCOUNTER — Other Ambulatory Visit: Payer: Self-pay

## 2019-09-12 LAB — GLUCOSE, CAPILLARY
Glucose-Capillary: 155 mg/dL — ABNORMAL HIGH (ref 70–99)
Glucose-Capillary: 161 mg/dL — ABNORMAL HIGH (ref 70–99)
Glucose-Capillary: 161 mg/dL — ABNORMAL HIGH (ref 70–99)
Glucose-Capillary: 188 mg/dL — ABNORMAL HIGH (ref 70–99)
Glucose-Capillary: 193 mg/dL — ABNORMAL HIGH (ref 70–99)

## 2019-09-12 LAB — COMPREHENSIVE METABOLIC PANEL
ALT: 34 U/L (ref 0–44)
AST: 48 U/L — ABNORMAL HIGH (ref 15–41)
Albumin: 2.9 g/dL — ABNORMAL LOW (ref 3.5–5.0)
Alkaline Phosphatase: 53 U/L (ref 38–126)
Anion gap: 10 (ref 5–15)
BUN: 18 mg/dL (ref 8–23)
CO2: 25 mmol/L (ref 22–32)
Calcium: 8.5 mg/dL — ABNORMAL LOW (ref 8.9–10.3)
Chloride: 100 mmol/L (ref 98–111)
Creatinine, Ser: 0.87 mg/dL (ref 0.44–1.00)
GFR calc Af Amer: >60 mL/min (ref >60)
GFR calc non Af Amer: >60 mL/min (ref >60)
Glucose, Bld: 187 mg/dL — ABNORMAL HIGH (ref 70–99)
Potassium: 4.7 mmol/L (ref 3.5–5.1)
Sodium: 135 mmol/L (ref 135–145)
Total Bilirubin: 0.8 mg/dL (ref 0.3–1.2)
Total Protein: 6.2 g/dL — ABNORMAL LOW (ref 6.5–8.1)

## 2019-09-12 LAB — CBC
HCT: 34.7 % — ABNORMAL LOW (ref 36.0–46.0)
Hemoglobin: 10.8 g/dL — ABNORMAL LOW (ref 12.0–15.0)
MCH: 28.8 pg (ref 26.0–34.0)
MCHC: 31.1 g/dL (ref 30.0–36.0)
MCV: 92.5 fL (ref 80.0–100.0)
Platelets: 211 10*3/uL (ref 150–400)
RBC: 3.75 MIL/uL — ABNORMAL LOW (ref 3.87–5.11)
RDW: 15.6 % — ABNORMAL HIGH (ref 11.5–15.5)
WBC: 10.1 10*3/uL (ref 4.0–10.5)
nRBC: 0 % (ref 0.0–0.2)

## 2019-09-12 MED ORDER — SODIUM CHLORIDE 0.45 % IV SOLN
INTRAVENOUS | Status: DC
  Administered 2019-09-12: 10:00:00 via INTRAVENOUS

## 2019-09-12 MED ORDER — METOCLOPRAMIDE HCL 5 MG/ML IJ SOLN
10.0000 mg | Freq: Four times a day (QID) | INTRAMUSCULAR | Status: AC
  Administered 2019-09-12 – 2019-09-14 (×8): 10 mg via INTRAVENOUS
  Filled 2019-09-12 (×8): qty 2

## 2019-09-12 NOTE — Progress Notes (Addendum)
      [address]       [place] [address]             [phone]      2 Days Post-Op Procedure(s) (LRB): VIDEO ASSISTED THORACOSCOPY (VATS)/RIGHT LOWER LOBECTOMY (Right) Lymph Node Dissection (Right) Intercostal Nerve Block (Right) Subjective: Feels a little more short of breath this morning. She threw up earlier after coughing.   Objective: Vital signs in last 24 hours: Temp:  [97.7 F (36.5 C)-98.8 F (37.1 C)] 98.7 F (37.1 C) (11/11 0400) Pulse Rate:  [80-120] 87 (11/11 0723) Cardiac Rhythm: Atrial fibrillation (11/11 0400) Resp:  [12-22] 22 (11/11 0723) BP: (82-127)/(53-64) 114/56 (11/11 0400) SpO2:  [89 %-97 %] 92 % (11/11 0723)     Intake/Output from previous day: 11/10 0701 - 11/11 0700 In: 1166.5 [P.O.:600; I.V.:546.5] Out: 680 [Urine:350; Chest Tube:330] Intake/Output this shift: No intake/output data recorded.  General appearance: alert, cooperative and no distress Heart: irregularly irregular rhythm Lungs: clear to auscultation bilaterally Abdomen: soft, non-tender; bowel sounds normal; no masses,  no organomegaly Extremities: extremities normal, atraumatic, no cyanosis or edema Wound: clean and dry  Lab Results: Recent Labs    09/11/19 0345 09/12/19 0417  WBC 12.5* 10.1  HGB 11.7* 10.8*  HCT 36.1 34.7*  PLT 240 211   BMET:  Recent Labs    09/11/19 0345 09/12/19 0417  NA 135 135  K 4.4 4.7  CL 100 100  CO2 24 25  GLUCOSE 133* 187*  BUN 15 18  CREATININE 0.87 0.87  CALCIUM 8.8* 8.5*    PT/INR: No results for input(s): LABPROT, INR in the last 72 hours. ABG    Component Value Date/Time   PHART 7.334 (L) 09/11/2019 0422   HCO3 25.5 09/11/2019 0422   O2SAT 96.5 09/11/2019 0422   CBG (last 3)  Recent Labs    09/11/19 2048 09/12/19 0004 09/12/19 0551  GLUCAP 154* 161* 155*    Assessment/Plan: S/P Procedure(s) (LRB): VIDEO ASSISTED THORACOSCOPY (VATS)/RIGHT LOWER LOBECTOMY (Right) Lymph Node Dissection  (Right) Intercostal Nerve Block (Right)  1. CV- Afib rate-controlled in the 80s. On Amio gtt. BP stable this morning with MAP of 73. Continue Lopressor 25mg  BID.  2. Pulm-she has been on 4-5L [place] over the last few days. CT to water seal. 330cc/24 hours.  3. CXR looks a little worse this morning. She has persistent bibasilar atelectasis. 4. H and H stable.  5. Blood glucose has been stable. Continue home medications.   Plan: Remains in rate-controlled afib. May need anticoagulation if she does no convert. Continue chest tube today due to drainage. Ordered a flutter valve and encouraged incentive spirometer. OOB to chair this morning. She didn't walk much yesterday due to the afib but willing to try today.    LOS: 2 days    Elgie Collard 09/12/2019 Patient seen and examined, agree with above Minimal air leak this AM A fib is rate controlled on IV amiodarone. Continue IV amiodarone until able to tolerate PO meds Nausea. Will stick to clear liquids until improved. Add reglan. Has a large hiatal hernia, no pain. Incisional pain well controlled  Remo Lipps C. Roxan Hockey, MD [facility] [phone]

## 2019-09-12 NOTE — Progress Notes (Signed)
RT NOTE: RT instructed patient on use of flutter valve. Pt is nauseas and felling sick so only performed 3 times. RT/RN will continue to work with flutter when pt is not feeling sick. Vitals are stable. RT will continue to monitor.

## 2019-09-13 ENCOUNTER — Other Ambulatory Visit: Payer: Self-pay

## 2019-09-13 ENCOUNTER — Inpatient Hospital Stay (HOSPITAL_COMMUNITY): Payer: Medicare Other

## 2019-09-13 DIAGNOSIS — E119 Type 2 diabetes mellitus without complications: Secondary | ICD-10-CM

## 2019-09-13 DIAGNOSIS — I480 Paroxysmal atrial fibrillation: Secondary | ICD-10-CM

## 2019-09-13 DIAGNOSIS — R110 Nausea: Secondary | ICD-10-CM

## 2019-09-13 LAB — GLUCOSE, CAPILLARY
Glucose-Capillary: 141 mg/dL — ABNORMAL HIGH (ref 70–99)
Glucose-Capillary: 160 mg/dL — ABNORMAL HIGH (ref 70–99)
Glucose-Capillary: 122 mg/dL — ABNORMAL HIGH (ref 70–99)
Glucose-Capillary: 140 mg/dL — ABNORMAL HIGH (ref 70–99)

## 2019-09-13 MED ORDER — GUAIFENESIN 100 MG/5ML PO SOLN
10.0000 mL | ORAL | Status: DC | PRN
  Administered 2019-09-13 – 2019-09-16 (×13): 200 mg via ORAL
  Filled 2019-09-13 (×4): qty 10
  Filled 2019-09-13: qty 5
  Filled 2019-09-13 (×2): qty 10
  Filled 2019-09-13: qty 5
  Filled 2019-09-13 (×3): qty 10
  Filled 2019-09-13: qty 50
  Filled 2019-09-13 (×2): qty 10

## 2019-09-13 MED ORDER — AMIODARONE HCL 200 MG PO TABS
200.0000 mg | ORAL_TABLET | Freq: Two times a day (BID) | ORAL | Status: DC
  Administered 2019-09-13 – 2019-09-14 (×2): 200 mg via ORAL
  Filled 2019-09-13 (×2): qty 1

## 2019-09-13 MED ORDER — AMIODARONE HCL 200 MG PO TABS
400.0000 mg | ORAL_TABLET | Freq: Two times a day (BID) | ORAL | Status: DC
  Administered 2019-09-13: 400 mg via ORAL
  Filled 2019-09-13: qty 2

## 2019-09-13 NOTE — Progress Notes (Addendum)
      [address]       [place] [address]             [phone]      3 Days Post-Op Procedure(s) (LRB): VIDEO ASSISTED THORACOSCOPY (VATS)/RIGHT LOWER LOBECTOMY (Right) Lymph Node Dissection (Right) Intercostal Nerve Block (Right) Subjective: Feels better this morning and attempting to eat pancakes.  Objective: Vital signs in last 24 hours: Temp:  [97.6 F (36.4 C)-98.4 F (36.9 C)] 98.4 F (36.9 C) (11/12 0330) Pulse Rate:  [64-86] 64 (11/12 0330) Cardiac Rhythm: Normal sinus rhythm (11/12 0330) Resp:  [12-22] 14 (11/12 0338) BP: (109-157)/(53-65) 111/57 (11/12 0330) SpO2:  [90 %-99 %] 97 % (11/12 0338)     Intake/Output from previous day: 11/11 0701 - 11/12 0700 In: 1251.1 [I.V.:1251.1] Out: 310 [Chest Tube:310] Intake/Output this shift: No intake/output data recorded.  General appearance: alert, cooperative and no distress Heart: regular rate and rhythm, S1, S2 normal, no murmur, click, rub or gallop Lungs: clear to auscultation bilaterally Abdomen: soft, non-tender; bowel sounds normal; no masses,  no organomegaly Extremities: extremities normal, atraumatic, no cyanosis or edema Wound: clean and dry  Lab Results: Recent Labs    09/11/19 0345 09/12/19 0417  WBC 12.5* 10.1  HGB 11.7* 10.8*  HCT 36.1 34.7*  PLT 240 211   BMET:  Recent Labs    09/11/19 0345 09/12/19 0417  NA 135 135  K 4.4 4.7  CL 100 100  CO2 24 25  GLUCOSE 133* 187*  BUN 15 18  CREATININE 0.87 0.87  CALCIUM 8.8* 8.5*    PT/INR: No results for input(s): LABPROT, INR in the last 72 hours. ABG    Component Value Date/Time   PHART 7.334 (L) 09/11/2019 0422   HCO3 25.5 09/11/2019 0422   O2SAT 96.5 09/11/2019 0422   CBG (last 3)  Recent Labs    09/12/19 1624 09/12/19 2127 09/13/19 0615  GLUCAP 193* 161* 141*    Assessment/Plan: S/P Procedure(s) (LRB): VIDEO ASSISTED THORACOSCOPY (VATS)/RIGHT LOWER LOBECTOMY (Right) Lymph Node Dissection (Right)  Intercostal Nerve Block (Right)  1. CV- NSR in the 80s-hx afib. On Amio gtt. BP stable this morning with MAP of 72. Will transition to PO. Marland Kitchen Continue Lopressor 25mg  BID.  2. Pulm-she has been on 4-5L [place] over the last few days. CT to water seal. 310cc/24 hours.  3. CXR pending. Ordered a flutter valve and continue IS.  4. H and H stable.  5. Blood glucose has been stable. Continue home medications.  6. Nausea-Reglan and Zofran on board. Trying a regular diet for breakfast.  Plan: Will switch Amio to PO since her nausea has improved. Keep chest tube for now due to output. Continue to work with flutter valve and incentive spirometer.    LOS: 3 days    Elgie Collard 09/13/2019 Patient seen and examined, agree with above PATH- squamous cell carcinoma T1bN0 CT - no air leak, minimal serous drainage over last shift- dc chest tube Increase ambulation IS, flutter for atelectasis  Remo Lipps C. Roxan Hockey, MD [facility] [phone]

## 2019-09-13 NOTE — Progress Notes (Signed)
Dilaudid PCA d/c'd wasted 14 mL into stericycle with Shanon Ace, RN in medication room. Syringe disposed in sharps container in medication room.

## 2019-09-13 NOTE — Progress Notes (Signed)
Per Dr. Roxan Hockey written order pt chest tube removed. Patient position back in bed and turned to L side. Chest tube removed with second RN at bedside. Secured with gauze and tape. RN educated patient on signs to notify nurse of if feel or seen. RN will continue to monitor.

## 2019-09-13 NOTE — Consult Note (Signed)
Cardiology Consultation:   Patient ID: Monica [name] MRN: [id number]; DOB: [birth year]  Admit date: 09/10/2019 Date of Consult: 09/13/2019  Primary Care Provider: Manon Hilding, MD Primary Cardiologist: Dr. Marlou Porch  Patient Profile:   Monica [name] is a 78 y.o. 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  Patient has history of postoperative atrial fibrillation after left lower lobe resection. Amiodarone was stopped on 06/13/2018 after complaints of nausea.  She has maintained sinus rhythm since then except intermittent butterfly/fluttery sensation.  Dr. Marlou Porch felt PAC/PVC type phenomena.  Plan to place Zio patch if worsening symptoms per last note 9/70/2020 by Dr. Marlou Porch.  History of Present Illness:   [name] recently found to have new right lower lobe lung nodule with hypermetabolic activity on PET/CT scan with increased size.  She underwent right lower lobe lobectomy with lymph node resection on 11/9.  However, patient developed rapid atrial fibrillation with rapid ventricular rate on 11/20.  Started on amiodarone bolus>> seems she was converted to sinus rhythm intermittently yesterday by review of telemetry>> she was switched to oral amiodarone 400 mg twice daily this morning however telemetry showing atrial fibrillation at controlled rate.  Will get EKG to confirm rhythm.  She was in sinus rhythm on admit. Cardiology is asked for further evaluation.  She did have nausea while on IV amiodarone for past 2 days, improved this morning.  She states that her nausea not as bad as it was last year. No dyspnea, chest pain, palpitations, orthopnea or PND.   Heart Pathway Score:      Past Medical History:  Diagnosis Date  . Cancer (HCC)    NHL, Malt Lymphoma  . Depression   . DM (diabetes mellitus) ([place])    TYPE  2  . Dysphagia   . Dysrhythmia    History of palpatations  . GERD (gastroesophageal reflux disease)    INGESTION   . Heart palpitations   . Hiatal hernia   . Hyperlipidemia   . Interstitial cystitis   . Lung cancer, lower lobe ([place]) 07/21/2018   Left lower lobe, stage Ia squamous cell carcinoma  . 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 Sinusitis     Past Surgical History:  Procedure Laterality Date  . ABDOMINAL HYSTERECTOMY    . BIOPSY  09/01/2016   Procedure: BIOPSY;  Surgeon: Daneil Dolin, MD;  [facility];  Service: Endoscopy;;  gastric  . BIOPSY  11/29/2018   Procedure: BIOPSY;  Surgeon: Daneil Dolin, MD;  [facility];  Service: Endoscopy;;  right colon  . BLADDER SURGERY    . CATARACT EXTRACTION W/ INTRAOCULAR LENS  IMPLANT, BILATERAL  2015  . CATARACT EXTRACTION, BILATERAL    . COLONOSCOPY     Dr. Lindalou Hose 2009: Normal per PCP notes  . COLONOSCOPY N/A 11/29/2018   Procedure: COLONOSCOPY;  Surgeon: Daneil Dolin, MD;  [facility];  Service: Endoscopy;  Laterality: N/A;  7:30am  . ESOPHAGOGASTRODUODENOSCOPY     RMR: Prominant Schzgzki ring/component of peptic stricture status post dilation and disruption as described above, otherwise norma esophagus, moderate-sized hiatal hernia, antal pyloric channel, and posterier bulbar erosions, otherwise unremarkable stomach, D1  and D2 . Inflammatory findings on the stomach and duodenum will likely be related to aspirin effect. We need to rule out Helicobacter pylor  . ESOPHAGOGASTRODUODENOSCOPY N/A 03/10/2015   Dr. 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 Pathology with lymphoid population of stomach, slight atypia  . ESOPHAGOGASTRODUODENOSCOPY N/A 02/03/2016   Dr. Gala Romney:  Schatzki ring noted at GE junction, dilated with 72 and then 75 Henderson dilator. Large hiatal hernia. 6 x 7 cm nodular geographically ulcerated mucosa, biopsy c/w MALToma  . ESOPHAGOGASTRODUODENOSCOPY N/A 04/08/2016   Dr. Gala Romney: moderate Schatzki's ring/web s/p dilation, large hiatal hernia, localized area of gastric lymphoma visualized and appeared to be much improved. normal second portion of the duodenum. No specimens collected. Esophageal lumen notably tighted up significantly since her dilation in April of this year.   . ESOPHAGOGASTRODUODENOSCOPY N/A 08/04/2016   Procedure: ESOPHAGOGASTRODUODENOSCOPY (EGD);  Surgeon: Daneil Dolin, MD;  [facility];  Service: Endoscopy;  Laterality: N/A;  7:30 am  . ESOPHAGOGASTRODUODENOSCOPY N/A 09/01/2016   Procedure: ESOPHAGOGASTRODUODENOSCOPY (EGD);  Surgeon: Daneil Dolin, MD;  [facility];  Service: Endoscopy;  Laterality: N/A;  830   . ESOPHAGOGASTRODUODENOSCOPY N/A 05/10/2017   Dr. Gala Romney: Moderate Schatzki ring at the GE junction, status post dilation with 20 [place].  Medium sized hiatal hernia.  Few localized erosions in the gastric antrum.  Stomach biopsy showed chronic gastritis, no H. pylori.  No atypical lymphoid infiltrates or other features of lymphoproliferative process  . ESOPHAGOGASTRODUODENOSCOPY N/A 06/13/2018   Dr. Gala Romney: Schatzki ring status post dilation.  Large hiatal hernia.  Focal area 4 x 4 cm along the greater curvature, somewhat erythematous and thickened mucosa, biopsy benign.  Next EGD in August 2020.  Marland Kitchen EYE SURGERY    . INTERCOSTAL NERVE BLOCK Right 09/10/2019   Procedure: Intercostal Nerve Block;  Surgeon: [name], MD;  Location: [facility];  Service: Thoracic;  Laterality: Right;  . LOBECTOMY Left 05/19/2018   Procedure: LEFT LOWER LOBECTOMY;  Surgeon: [name], MD;  Location: [facility];  Service: Thoracic;  Laterality: Left;  . LYMPH NODE DISSECTION Right 09/10/2019   Procedure:  Lymph Node Dissection;  Surgeon: [name], MD;  Location: [facility];  Service: Thoracic;  Laterality: Right;  . MALONEY DILATION N/A 03/10/2015   Procedure: Venia Minks DILATION;  Surgeon: Daneil Dolin, MD;  [facility];  Service: Endoscopy;  Laterality: N/A;  . Venia Minks DILATION N/A 02/03/2016   Procedure: Venia Minks DILATION;  Surgeon: Daneil Dolin, MD;  [facility];  Service: Endoscopy;  Laterality: N/A;  . Venia Minks DILATION N/A 04/08/2016   Procedure: Venia Minks DILATION;  Surgeon: Daneil Dolin, MD;  [facility];  Service: Endoscopy;  Laterality: N/A;  . Venia Minks DILATION N/A 05/10/2017   Procedure: Venia Minks DILATION;  Surgeon: Daneil Dolin, MD;  [facility];  Service: Endoscopy;  Laterality: N/A;  . Venia Minks DILATION N/A 06/13/2018   Procedure: Venia Minks DILATION;  Surgeon: Daneil Dolin, MD;  [facility];  Service: Endoscopy;  Laterality: N/A;  . POLYPECTOMY  11/29/2018   Procedure: POLYPECTOMY;  Surgeon: Daneil Dolin, MD;  [facility];  Service: Endoscopy;;  . VIDEO ASSISTED THORACOSCOPY (VATS)/ LOBECTOMY Right 09/10/2019   Procedure: VIDEO ASSISTED THORACOSCOPY (VATS)/RIGHT LOWER LOBECTOMY;  Surgeon: [name], MD;  Location: [facility];  Service: Thoracic;  Laterality: Right;  Marland Kitchen VIDEO ASSISTED THORACOSCOPY (VATS)/WEDGE RESECTION Left  05/19/2018   Procedure: VIDEO ASSISTED THORACOSCOPY (VATS)/WEDGE RESECTION;  Surgeon: [name], MD;  Location: [facility];  Service: Thoracic;  Laterality: Left;     Inpatient Medications: Scheduled Meds: . acetaminophen  1,000 mg Oral Q6H   Or  . acetaminophen (TYLENOL) oral liquid 160 mg/5 mL  1,000 mg Oral Q6H  . amiodarone  400 mg Oral BID  . aspirin EC  81 mg Oral QHS  . bisacodyl  10 mg Oral Daily  . Chlorhexidine Gluconate Cloth  6 each Topical Daily  . citalopram  20 mg Oral Daily  . enoxaparin (LOVENOX) injection  40 mg Subcutaneous Q24H  . HYDROmorphone    Intravenous Q4H  . insulin aspart  0-15 Units Subcutaneous TID WC  . ipratropium-albuterol  3 mL Nebulization TID  . mouth rinse  15 mL Mouth Rinse BID  . metFORMIN  500 mg Oral Daily  . metoCLOPramide (REGLAN) injection  10 mg Intravenous Q6H  . metoprolol tartrate  25 mg Oral BID  . pioglitazone  15 mg Oral Daily  . senna-docusate  1 tablet Oral QHS  . simvastatin  20 mg Oral QHS   Continuous Infusions: . sodium chloride 10 mL/hr at 09/13/19 0902  . amiodarone 30 mg/hr (09/13/19 0920)   PRN Meds: diphenhydrAMINE **OR** diphenhydrAMINE, ipratropium-albuterol, naloxone **AND** sodium chloride flush, ondansetron (ZOFRAN) IV, oxyCODONE, polyvinyl alcohol, traMADol  Allergies:    Allergies  Allergen Reactions  . Sulfur Hives    Social History:   Social History   Socioeconomic History  . Marital status: Married    Spouse name: Not on file  . Number of children: Not on file  . Years of education: Not on file  . Highest education level: Not on file  Occupational History  . Not on file  Social Needs  . Financial resource strain: Not on file  . Food insecurity    Worry: Not on file    Inability: Not on file  . Transportation needs    Medical: Not on file    Non-medical: Not on file  Tobacco Use  . Smoking status: Current Every Day Smoker    Packs/day: 0.50    Years: 57.00    Pack years: 28.50    Types: Cigarettes    Last attempt to quit: 05/23/2018    Years since quitting: 1.3  . Smokeless tobacco: Never Used  . Tobacco comment: trying to quit, wearing patch  Substance and Sexual Activity  . Alcohol use: No    Alcohol/week: 0.0 standard drinks  . Drug use: No  . Sexual activity: Not on file  Lifestyle  . Physical activity    Days per week: Not on file    Minutes per session: Not on file  . Stress: Not on file  Relationships  . Social Herbalist on phone: Not on file    Gets together: Not on file    Attends religious service: Not on file    Active  member of club or organization: Not on file    Attends meetings of clubs or organizations: Not on file    Relationship status: Not on file  . Intimate partner violence    Fear of current or ex partner: Not on file    Emotionally abused: Not on file    Physically abused: Not on file    Forced sexual activity: Not on file  Other Topics Concern  . Not on file  Social History Narrative  . Not on file  Family History:   Family History  Problem Relation Age of Onset  . Heart attack Father   . Dementia Father   . Diabetes Sister   . Diabetes Brother   . Diabetes Sister   . Colon cancer Neg Hx      ROS:  Please see the history of present illness.  All other ROS reviewed and negative.     Physical Exam/Data:   Vitals:   09/13/19 0801 09/13/19 0914 09/13/19 0922 09/13/19 0926  BP:  108/64 108/64   Pulse: 78 80 77   Resp: (!) 22 15  (!) 97  Temp:  98.4 F (36.9 C)    TempSrc:  Oral    SpO2: 95% 98%  (!) 19%    Intake/Output Summary (Last 24 hours) at 09/13/2019 1021 Last data filed at 09/13/2019 0908 Gross per 24 hour  Intake 1371.05 ml  Output 340 ml  Net 1031.05 ml   Last 3 Weights 09/06/2019 08/28/2019 08/15/2019  Weight (lbs) 175 lb 7 oz 170 lb 174 lb 12.8 oz  Weight (kg) 79.578 kg 77.111 kg 79.289 kg     There is no height or weight on file to calculate BMI.  General:  Well nourished, well developed, in no acute distress HEENT: normal Lymph: no adenopathy Neck: no JVD Endocrine:  No thryomegaly Vascular: No carotid bruits; FA pulses 2+ bilaterally without bruits  Cardiac:  normal S1, S2; Irregular; no murmur  Lungs:  clear to auscultation bilaterally, no wheezing, rhonchi or rales, Chest tube on R side Abd: soft, nontender, no hepatomegaly  Ext: no edema Musculoskeletal:  No deformities, BUE and BLE strength normal and equal Skin: warm and dry  Neuro:  CNs 2-12 intact, no focal abnormalities noted Psych:  Normal affect   EKG:  The EKG was personally  reviewed and demonstrates:  SR at rate of 78 bpm, RBBB Telemetry:  Telemetry was personally reviewed and demonstrates:  Atrial fibrillation at controlled rate  Relevant CV Studies:  Echo 06/2018 - Left ventricle: The cavity size was normal. Wall thickness was   increased in a pattern of moderate LVH. Systolic function was   vigorous. The estimated ejection fraction was in the range of 65%   to 70%. Wall motion was normal; there were no regional wall   motion abnormalities. Features are consistent with a pseudonormal   left ventricular filling pattern, with concomitant abnormal   relaxation and increased filling pressure (grade 2 diastolic   dysfunction). - Aortic valve: Mildly calcified annulus. Trileaflet. - Mitral valve: There was mild regurgitation. - Left atrium: The atrium was moderately dilated. - Right atrium: Central venous pressure (est): 3 mm Hg. - Atrial septum: No defect or patent foramen ovale was identified. - Tricuspid valve: There was trivial regurgitation. - Pulmonary arteries: Systolic pressure could not be accurately   estimated. - Pericardium, extracardiac: There was no pericardial effusion.  Laboratory Data:  Chemistry Recent Labs  Lab 09/06/19 1400 09/11/19 0345 09/12/19 0417  NA 135 135 135  K 4.4 4.4 4.7  CL 105 100 100  CO2 21* 24 25  GLUCOSE 207* 133* 187*  BUN 12 15 18   CREATININE 0.79 0.87 0.87  CALCIUM 9.1 8.8* 8.5*  GFRNONAA >60 >60 >60  GFRAA >60 >60 >60  ANIONGAP 9 11 10     Recent Labs  Lab 09/06/19 1400 09/12/19 0417  PROT 7.1 6.2*  ALBUMIN 3.7 2.9*  AST 23 48*  ALT 16 34  ALKPHOS 61 53  BILITOT <0.1* 0.8  Hematology Recent Labs  Lab 09/06/19 1400 09/11/19 0345 09/12/19 0417  WBC 6.7 12.5* 10.1  RBC 4.43 3.93 3.75*  HGB 13.0 11.7* 10.8*  HCT 39.7 36.1 34.7*  MCV 89.6 91.9 92.5  MCH 29.3 29.8 28.8  MCHC 32.7 32.4 31.1  RDW 15.0 15.4 15.6*  PLT 244 240 211   Radiology/Studies:  Dg Chest 2 View  Result Date:  09/10/2019 CLINICAL DATA:  Preoperative examination for patient with a right lower lobe pulmonary nodule worrisome for carcinoma. EXAM: CHEST - 2 VIEW COMPARISON:  PA and lateral chest 08/08/2018. FINDINGS: The lungs are emphysematous. Right lower lobe pulmonary nodule is identified as seen on the prior CT scan. Heart size is normal. Atherosclerosis is noted. The patient has a hiatal hernia. No acute or focal bony abnormality. IMPRESSION: No acute disease in patient with a known right lower lobe pulmonary nodule. Emphysema. Atherosclerosis. Hiatal hernia. Electronically Signed   By: Inge Rise M.D.   On: 09/10/2019 06:57   Dg Chest Port 1 View  Result Date: 09/13/2019 CLINICAL DATA:  Atelectasis. EXAM: PORTABLE CHEST 1 VIEW COMPARISON:  One-view chest x-ray 11 11/20 FINDINGS: The heart size is exaggerated by low lung volumes. Atherosclerotic changes are noted at the arch. Right IJ catheter is stable. A right-sided chest tube is in place. Small pneumothorax remains. Large hiatal hernia is present. Small effusions and bilateral airspace disease is similar the prior exam. IMPRESSION: 1. Stable small right apical pneumothorax with chest tube in place. 2. Stable small bilateral pleural effusions and bilateral airspace disease. 3. Large hiatal hernia. 4. Atherosclerosis. Electronically Signed   By: San Morelle M.D.   On: 09/13/2019 08:06   Dg Chest Port 1 View  Result Date: 09/12/2019 CLINICAL DATA:  Status post lobectomy. EXAM: PORTABLE CHEST 1 VIEW COMPARISON:  09/11/2019 FINDINGS: The right-sided chest tube is stable.  Tiny apical pneumothorax. The right IJ catheter is in good position, unchanged. Right lung scarring changes and right basilar atelectasis. Large hiatal hernia with overlying vascular crowding and atelectasis at the left lung base. Suspect small left effusion also. IMPRESSION: 1. Stable right IJ catheter and right chest tube. 2. Tiny apical pneumothorax. 3. Persistent and slight  worsening bibasilar atelectasis and suspected small left effusion. Electronically Signed   By: Marijo Sanes M.D.   On: 09/12/2019 08:13   Dg Chest Port 1 View  Result Date: 09/11/2019 CLINICAL DATA:  Chest tube.  Pneumothorax. EXAM: PORTABLE CHEST 1 VIEW COMPARISON:  09/10/2019 PET-CT 08/13/2019. FINDINGS: Right chest tube and right PICC line stable position. Previously identified tiny right apical pneumothorax is not identified on today's exam. Persistent bibasilar atelectasis. Heart size stable. Prominent hiatal hernia again noted. IMPRESSION: 1. Right chest tube right PICC line stable position. Previously identified tiny right apical pneumothorax is not identified on today's exam. 2.  Persistent bibasilar atelectasis. 3.  Prominent hiatal hernia again noted. Electronically Signed   By: Marcello Moores  Register   On: 09/11/2019 06:50   Dg Chest Port 1 View  Result Date: 09/10/2019 CLINICAL DATA:  Patient status post right VATS for a pulmonary nodule today. EXAM: PORTABLE CHEST 1 VIEW COMPARISON:  Single-view of the chest earlier today. FINDINGS: New right IJ catheter is in place with its tip projecting in the lower superior vena cava. New right chest tube is also seen. There is a tiny right apical pneumothorax, less than 5%. Bibasilar atelectasis is noted. Small amount of pleural fluid is present on the right. Pulmonary nodule seen on the study earlier today  is no longer identified. IMPRESSION: Right IJ catheter tip projects in good position. Tiny right apical pneumothorax with a right chest tube in place. Electronically Signed   By: Inge Rise M.D.   On: 09/10/2019 11:21    Assessment and Plan:   1. Paroxysmal atrial fibrillation Prior history of postop atrial fibrillation >> amiodarone discontinue after 1 month of initiation secondary to nausea.  She was in sinus rhythm upon admission this time.  She had a postop A. fib and placed on IV amiodarone>> she was in sinus rhythm intermittently  yesterday>> rate control atrial fibrillation this morning.  Pending EKG.  Her nausea not as bad as it was last year. On Amiodarone 400mg  BID and Lopressor 25mg  BID. CHADSVASCs score of 4 for age, sex and DM.  - She did have intermittent butterfly/fluttery sensation for past year.  Dr. Marlou Porch felt PAC/PVC type phenomena.  Likely she will need monitor. Will review plan with MD.   2. S/p Right lobe lobectomy with prior hx of stage Ia squamous cell carcinoma s/p  left lower lobe resection on 05/19/2018 - Recovering well  3. DM - On metformin  4. RBBB - Chronic  Dr. Harrell Gave to see later today.   For questions or updates, please contact [place] Please consult [email or web address] for contact info under     Jarrett Soho, PA  09/13/2019 10:21 AM

## 2019-09-14 ENCOUNTER — Inpatient Hospital Stay (HOSPITAL_COMMUNITY): Payer: Medicare Other

## 2019-09-14 DIAGNOSIS — Z9889 Other specified postprocedural states: Secondary | ICD-10-CM

## 2019-09-14 DIAGNOSIS — E785 Hyperlipidemia, unspecified: Secondary | ICD-10-CM

## 2019-09-14 DIAGNOSIS — R112 Nausea with vomiting, unspecified: Secondary | ICD-10-CM

## 2019-09-14 LAB — GLUCOSE, CAPILLARY
Glucose-Capillary: 143 mg/dL — ABNORMAL HIGH (ref 70–99)
Glucose-Capillary: 169 mg/dL — ABNORMAL HIGH (ref 70–99)
Glucose-Capillary: 169 mg/dL — ABNORMAL HIGH (ref 70–99)
Glucose-Capillary: 123 mg/dL — ABNORMAL HIGH (ref 70–99)

## 2019-09-14 MED ORDER — METOCLOPRAMIDE HCL 5 MG/ML IJ SOLN
10.0000 mg | Freq: Four times a day (QID) | INTRAMUSCULAR | Status: AC
  Administered 2019-09-14 – 2019-09-15 (×6): 10 mg via INTRAVENOUS
  Filled 2019-09-14 (×6): qty 2

## 2019-09-14 MED ORDER — PRAVASTATIN SODIUM 40 MG PO TABS
40.0000 mg | ORAL_TABLET | Freq: Every day | ORAL | Status: DC
  Administered 2019-09-14 – 2019-09-15 (×2): 40 mg via ORAL
  Filled 2019-09-14 (×2): qty 1

## 2019-09-14 MED ORDER — APIXABAN 5 MG PO TABS
5.0000 mg | ORAL_TABLET | Freq: Two times a day (BID) | ORAL | Status: DC
  Administered 2019-09-14 – 2019-09-16 (×5): 5 mg via ORAL
  Filled 2019-09-14 (×5): qty 1

## 2019-09-14 MED ORDER — IPRATROPIUM-ALBUTEROL 0.5-2.5 (3) MG/3ML IN SOLN
3.0000 mL | Freq: Two times a day (BID) | RESPIRATORY_TRACT | Status: DC
  Administered 2019-09-15 – 2019-09-16 (×3): 3 mL via RESPIRATORY_TRACT
  Filled 2019-09-14 (×4): qty 3

## 2019-09-14 NOTE — Plan of Care (Signed)

## 2019-09-14 NOTE — Discharge Summary (Addendum)
[address]       [place] [address]             [phone]      Physician Discharge Summary  Patient ID: Monica [name] MRN: [id number] DOB/AGE: 1941-08-09 78 y.o.  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  She was found to have a 1.3 cm left lower lobe nodule about a year and a half ago.  She had a thoracoscopic left lower lobectomy on 05/19/2018.  The nodule turned out to be a stage I a squamous cell carcinoma.  She had atrial fibrillation postoperatively which resolved with amiodarone.  She did not require adjuvant therapy.  She has been followed since that time.  She had a PET CT in June which showed a new 9 mm pulmonary nodule with an SUV of 2.6.  A repeat PET/CT showed the nodule had increased in size to 1.8 cm with an SUV of 5.6.  There also was some mild FDG uptake in the right hilum with an SUV of 3.94.  It is difficult to determine adenopathy on the noncontrast scan.  She is been feeling well.  She says that she does get short of breath with exertion since her previous resection.  However, she says that she can do most anything she wants to.  She does have a chronic productive cough of clear sputum.  She has gained weight during the Covid isolation.  She has not had any change in appetite.  She has not had any unusual headaches or visual changes.  She quit smoking initially after her surgery but had  started back about 3 months later.  She is currently smoking 1/2 pack/day.  Overall greater than 50-pack-year history    Hospital Course:   On 08/10/2019 Monica [name] underwent a right lower lobectomy, right video-assisted thoracoscopy, mediastinal lymph node dissection, and intercostal nerve block with Dr. Roxan Hockey.  She tolerated the procedure well was extubated in a timely manner.  She was transferred to the cardiac telemetry unit for continued care.  Postop day 1 she went into atrial fibrillation with a rate in the 120s.  We started an amiodarone bolus and drip.  The drip was paused due to some hypotension but once her blood pressure rose to normal we resumed the drip.  We continued amiodarone IV an additional day due to some nausea and vomiting.  She was tolerating 4 to 5 L nasal cannula and her chest tube was switched to waterseal.  She was encouraged to ambulate in the halls.  This has been delayed due to her atrial fibrillation.  On the amiodarone drip she remained in rate controlled atrial fibrillation in the 80s.  Postop day 3 she finally converted to normal sinus rhythm in the 80s and she was able to tolerate pills at this time so we transitioned her to 400 mg of amiodarone twice daily.  She was still taking her Lopressor 25 mg twice daily at this  time.  We will continue to encourage her to use her flutter valve and incentive spirometer.  Her chest tube was pulled on postop day 3.  Postop day 4 she was able to eat small amounts.  She did have some shortness of breath the night before which had improved.  We were able to start her Eliquis since the chest tubes have been removed.  Cardiology came to see the patient due to her history of atrial fibrillation.  She continued to have some bibasilar atelectasis and we were working on weaning her oxygen requirements.  Cardiology suggested stopping the amiodarone perhaps to ease the nausea.  She remains in rate controlled atrial fibrillation in the 80s but is  now protected on Eliquis.  She developed nausea limiting her ability to eat.  She was treated with anti-emetics with improvement of symptoms.  She developed difficulty clearing sputum and was placed on Mucinex.  She is now tolerating a diet.  She is ambulating independently.  Her incision is healing well.  She is medically stable for discharge home today.  Consults: None  Significant Diagnostic Studies:  CLINICAL DATA:  Post lobectomy.  EXAM: CHEST - 2 VIEW  COMPARISON:  09/13/2019  FINDINGS: Right IJ central venous catheter unchanged. Lungs are adequately inflated with stable possible tiny right apical pneumothorax. Slight worsening right base opacification likely small amount of pleural fluid and atelectasis. Infection in the right base is possible. Subtle hazy density of the perihilar vessels. Stable mild cardiomegaly. Remainder of the exam is unchanged.  IMPRESSION: 1. Mild stable cardiomegaly with suggestion of minimal vascular congestion.  2. Slight interval worsening opacification over the right base likely small effusion with atelectasis as infection is possible.  3. Stable tiny right apical pneumothorax. Right IJ central venous catheter unchanged.   Electronically Signed   By: Marin Olp M.D.   On: 09/14/2019 08:04  Treatments:   NAME: KIMMERLY, LORA MEDICAL RECORD EX:[id number] ACCOUNT [id number] DATE OF BIRTH:06-01-1941 FACILITY: [facility] LOCATION: [facility] PHYSICIAN:Caige Almeda Chaya Jan, MD  OPERATIVE REPORT  DATE OF PROCEDURE:  09/10/2019  PREOPERATIVE DIAGNOSIS:  Right lower lobe lung nodule.  POSTOPERATIVE DIAGNOSIS:  Nonsmall cell carcinoma, right lower lobe, clinical stage IIA (T1, N1).  PROCEDURES:   1.  Right video-assisted thoracoscopy.  2.  Thoracoscopic right lower lobectomy.  3.  Mediastinal lymph node dissection 4.  Intercostal nerve blocks at levels 3 through 10.  SURGEON:  Modesto Charon, MD  ASSISTANT:  Enid Cutter, PA.  ANESTHESIA:  General.  FINDINGS:  Mass in right lower lobe.  Frozen section revealed nonsmall cell carcinoma.  Bronchial margins free of tumor.  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  She recently had a PET CT which showed a new 9 mm lung nodule.  On repeat, the nodule had increased to 1.8 cm  and was hypermetabolic.  There was a questionable hypermetabolic level 11 lymph node.  She was offered the option of biopsy and referral for radiation versus surgical resection.  She knew this would be a relatively greater loss of pulmonary function than  her previous resection.  She understood and accepted the risks and wished to proceed with surgery.  Discharge Exam: Blood pressure (!) 164/83, pulse 93, temperature 97.6 F (36.4 C), resp. rate (!) 25, SpO2 92 %.   General appearance: alert, cooperative and no distress Heart: irregularly irregular rhythm Lungs: clear to auscultation bilaterally  Abdomen: soft, non-tender; bowel sounds normal; no masses,  no organomegaly Wound: clean and dry   Discharge disposition: 01-Home or Self Care  Discharge Medications:   Allergies as of 09/16/2019      Reactions   Sulfur Hives      Medication List    STOP taking these medications   simvastatin 20 MG tablet Commonly known as: ZOCOR     TAKE these medications   acetaminophen 325 MG tablet Commonly known as: TYLENOL Take 650 mg by mouth every 6 (six) hours as needed for moderate pain or headache.   apixaban 5 MG Tabs tablet Commonly known as: ELIQUIS Take 1 tablet (5 mg total) by mouth 2 (two) times daily.   aspirin EC 81 MG tablet Take 81 mg by mouth at bedtime.   citalopram 20 MG tablet Commonly known as: CELEXA Take 20 mg by mouth daily.   dextromethorphan-guaiFENesin 30-600 MG 12hr tablet Commonly known as: MUCINEX DM Take 1 tablet by mouth 2 (two) times  daily.   losartan 50 MG tablet Commonly known as: COZAAR Take 1 tablet (50 mg total) by mouth daily.   metFORMIN 500 MG 24 hr tablet Commonly known as: GLUCOPHAGE-XR Take 500 mg by mouth daily.   metoprolol tartrate 25 MG tablet Commonly known as: LOPRESSOR Take 1 tablet (25 mg total) by mouth 2 (two) times daily. What changed:   medication strength  how much to take  when to take this   multivitamin with minerals Tabs tablet Take 1 tablet by mouth daily.   ondansetron 4 MG tablet Commonly known as: Zofran Take 1 tablet (4 mg total) by mouth every 8 (eight) hours as needed for nausea or vomiting.   pioglitazone 15 MG tablet Commonly known as: ACTOS Take 15 mg by mouth daily.   pravastatin 40 MG tablet Commonly known as: PRAVACHOL Take 40 mg by mouth at bedtime.   Refresh 1.4-0.6 % Soln Generic drug: Polyvinyl Alcohol-Povidone PF Place 1 drop into both eyes as needed (Dry eyes).   traMADol 50 MG tablet Commonly known as: ULTRAM Take 1-2 tablets (50-100 mg total) by mouth every 6 (six) hours as needed for moderate pain.      Follow-up Information    Sasser, Silvestre Moment, MD. Call in 1 day(s).   Specialty: Family Medicine Contact information: [place] [address] [phone]        Burtis Junes, NP Follow up.   Specialties: Nurse Practitioner, Interventional Cardiology, Cardiology, Radiology Why: Truitt Merle, NP 11/24 @11am  ([facility])  Contact information: [place]. [address] [place] [address] [phone]        [name], MD Follow up.   Specialty: Cardiothoracic Surgery Why: Your routine follow-up appointment is on 10/02/2019 at 1pm. Please arrive at 12:30p for a chest xray located at [facility] which is on the first floor of our building.  Contact information: [address] [place] [address] [phone]           Signed:   Ellwood Handler PA-C 09/16/2019, 10:25 AM

## 2019-09-14 NOTE — Discharge Instructions (Addendum)
Discharge Instructions:  1. You may shower, please wash incisions daily with soap and water and keep dry.  If you wish to cover wounds with dressing you may do so but please keep clean and change daily.  No tub baths or swimming until incisions have completely healed.  If your incisions become red or develop any drainage please call our office at [phone]  2. No Driving until cleared by Dr. Leonarda Salon office and you are no longer using narcotic pain medications  3. Monitor your weight daily.. Please use the same scale and weigh at same time... If you gain 3-5 lbs in 48 hours with associated lower extremity swelling, please contact our office at [phone]  4. Fever of 101.5 for at least 24 hours, please contact our office at [phone]  5. Activity- up as tolerated, please walk at least 3 times per day.  Avoid strenuous activity, no lifting, pushing, or pulling with your arms over 8-10 lbs for a minimum of 6 weeks  6. If any questions or concerns arise, please do not hesitate to contact our office at [phone]   Information on my medicine - ELIQUIS (apixaban)  Why was Eliquis prescribed for you? Eliquis was prescribed for you to reduce the risk of a blood clot forming that can cause a stroke if you have a medical condition called atrial fibrillation (a type of irregular heartbeat).  What do You need to know about Eliquis ? Take your Eliquis TWICE DAILY - one tablet in the morning and one tablet in the evening with or without food. If you have difficulty swallowing the tablet whole please discuss with your pharmacist how to take the medication safely.  Take Eliquis exactly as prescribed by your doctor and DO NOT stop taking Eliquis without talking to the doctor who prescribed the medication.  Stopping may increase your risk of developing a stroke.  Refill your prescription before you run out.  After discharge, you should have regular check-up appointments with your  healthcare provider that is prescribing your Eliquis.  In the future your dose may need to be changed if your kidney function or weight changes by a significant amount or as you get older.  What do you do if you miss a dose? If you miss a dose, take it as soon as you remember on the same day and resume taking twice daily.  Do not take more than one dose of ELIQUIS at the same time to make up a missed dose.  Important Safety Information A possible side effect of Eliquis is bleeding. You should call your healthcare provider right away if you experience any of the following: ? Bleeding from an injury or your nose that does not stop. ? Unusual colored urine (red or dark brown) or unusual colored stools (red or black). ? Unusual bruising for unknown reasons. ? A serious fall or if you hit your head (even if there is no bleeding).  Some medicines may interact with Eliquis and might increase your risk of bleeding or clotting while on Eliquis. To help avoid this, consult your healthcare provider or pharmacist prior to using any new prescription or non-prescription medications, including herbals, vitamins, non-steroidal anti-inflammatory drugs (NSAIDs) and supplements.  This website has more information on Eliquis (apixaban): http://www.eliquis.com/eliquis/home

## 2019-09-14 NOTE — Progress Notes (Signed)
RN aware of order to d/c central line and will follow up.

## 2019-09-14 NOTE — Progress Notes (Signed)
ANTICOAGULATION CONSULT NOTE - Initial Consult  Pharmacy Consult for apixaban Indication: atrial fibrillation  Allergies  Allergen Reactions  . Sulfur Hives    Patient Measurements: Wt: 79.6 kg Ht: 5'6  Vital Signs: Temp: 98.7 F (37.1 C) (11/13 0820) Temp Source: Oral (11/13 0820) BP: 129/64 (11/13 0400) Pulse Rate: 81 (11/13 0400)  Labs: Recent Labs    09/12/19 0417  HGB 10.8*  HCT 34.7*  PLT 211  CREATININE 0.87    Estimated Creatinine Clearance: 57.6 mL/min (by C-G formula based on SCr of 0.87 mg/dL).   Medical History: Past Medical History:  Diagnosis Date  . Cancer (HCC)    NHL, Malt Lymphoma  . Depression   . DM (diabetes mellitus) ([place])    TYPE  2  . Dysphagia   . Dysrhythmia    History of palpatations  . GERD (gastroesophageal reflux disease)    INGESTION   . Heart palpitations   . Hiatal hernia   . Hyperlipidemia   . Interstitial cystitis   . Lung cancer, lower lobe ([place]) 07/21/2018   Left lower lobe, stage Ia squamous cell carcinoma  . 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 Sinusitis     Medications:  Scheduled:  . acetaminophen  1,000 mg Oral Q6H   Or  . acetaminophen (TYLENOL) oral liquid 160 mg/5 mL  1,000 mg Oral Q6H  . amiodarone  200 mg Oral BID  . apixaban  5 mg Oral BID  . aspirin EC  81 mg Oral QHS  . bisacodyl  10 mg Oral Daily  . Chlorhexidine Gluconate Cloth  6 each Topical Daily  . citalopram  20 mg Oral Daily  . insulin aspart  0-15 Units Subcutaneous TID WC  . ipratropium-albuterol  3 mL Nebulization BID  . mouth rinse  15 mL Mouth Rinse BID  . metoCLOPramide (REGLAN) injection  10 mg Intravenous Q6H  . metoprolol tartrate  25 mg Oral BID  . senna-docusate  1 tablet Oral QHS  . simvastatin  20 mg Oral QHS    Assessment: 22 yof with RLL resection for nonsmall cell lung cancer s/p VATs procedure, now in atrial fibrillation. No AC PTA.  Hgb 10.8, plt 211 on last check 11/11. No s/sx of  bleeding. Given age<80, wt> 60 kg, and Scr<1.5, will dose at full dose.   Goal of Therapy:  Monitor platelets by anticoagulation protocol: Yes   Plan:  Apixaban 5 mg twice daily Monitor renal fx, CBC, and for s/sx of bleeding.   Antonietta Jewel, PharmD, BCCCP Clinical Pharmacist  Phone: [phone]  Please check AMION for all [place] phone numbers After 10:00 PM, call [place] [phone] 09/14/2019,9:03 AM

## 2019-09-14 NOTE — Progress Notes (Signed)
Ambulated along the hallway with front wheel walker about 250 feet and started complaining of shortness of breath.

## 2019-09-14 NOTE — Progress Notes (Signed)
Weaned off oxygen but started complaining of shortness of breath.

## 2019-09-14 NOTE — Progress Notes (Signed)
Progress Note  Patient Name: Monica [name] Date of Encounter: 09/14/2019  Primary Cardiologist: Candee Furbish, MD  Subjective   Still remains very nauseated, everything she eats/drinks comes back up. Had two spells of shortness of breath/coughing last night, which align with brief bradycardia on the monitor. Pain overall well controlled. Very fatigued.  Inpatient Medications    Scheduled Meds: . acetaminophen  1,000 mg Oral Q6H   Or  . acetaminophen (TYLENOL) oral liquid 160 mg/5 mL  1,000 mg Oral Q6H  . apixaban  5 mg Oral BID  . aspirin EC  81 mg Oral QHS  . bisacodyl  10 mg Oral Daily  . Chlorhexidine Gluconate Cloth  6 each Topical Daily  . citalopram  20 mg Oral Daily  . insulin aspart  0-15 Units Subcutaneous TID WC  . ipratropium-albuterol  3 mL Nebulization BID  . mouth rinse  15 mL Mouth Rinse BID  . metoCLOPramide (REGLAN) injection  10 mg Intravenous Q6H  . metoprolol tartrate  25 mg Oral BID  . senna-docusate  1 tablet Oral QHS  . simvastatin  20 mg Oral QHS   Continuous Infusions: . sodium chloride 10 mL/hr at 09/13/19 0902   PRN Meds: diphenhydrAMINE **OR** diphenhydrAMINE, guaiFENesin, ipratropium-albuterol, naloxone **AND** sodium chloride flush, ondansetron (ZOFRAN) IV, oxyCODONE, polyvinyl alcohol, traMADol   Vital Signs    Vitals:   09/14/19 0318 09/14/19 0400 09/14/19 0745 09/14/19 0820  BP: (!) 143/70 129/64    Pulse: 83 81    Resp: (!) 21 20    Temp: 97.7 F (36.5 C)   98.7 F (37.1 C)  TempSrc: Oral   Oral  SpO2: 95% 98% 96%     Intake/Output Summary (Last 24 hours) at 09/14/2019 0935 Last data filed at 09/14/2019 7412 Gross per 24 hour  Intake 340 ml  Output 300 ml  Net 40 ml   Last 3 Weights 09/06/2019 08/28/2019 08/15/2019  Weight (lbs) 175 lb 7 oz 170 lb 174 lb 12.8 oz  Weight (kg) 79.578 kg 77.111 kg 79.289 kg      Telemetry    Has predominantly been in atrial fibrillation, with two brief spells of bradycardia (appears afib  slow VR) during coughing episodes at 7:30 and 9:30 PM - Personally Reviewed  ECG    Rate controlled afib with iRBBB - Personally Reviewed  Physical Exam   GEN: No acute distress.   Neck: No JVD Cardiac: RRR, no murmurs, rubs, or gallops.  Respiratory: Clear to auscultation bilaterally. GI: Soft, nontender, non-distended  MS: No edema; No deformity. Neuro:  Nonfocal  Psych: Normal affect   Labs    High Sensitivity Troponin:  No results for input(s): TROPONINIHS in the last 720 hours.    Chemistry Recent Labs  Lab 09/11/19 0345 09/12/19 0417  NA 135 135  K 4.4 4.7  CL 100 100  CO2 24 25  GLUCOSE 133* 187*  BUN 15 18  CREATININE 0.87 0.87  CALCIUM 8.8* 8.5*  PROT  --  6.2*  ALBUMIN  --  2.9*  AST  --  48*  ALT  --  34  ALKPHOS  --  53  BILITOT  --  0.8  GFRNONAA >60 >60  GFRAA >60 >60  ANIONGAP 11 10     Hematology Recent Labs  Lab 09/11/19 0345 09/12/19 0417  WBC 12.5* 10.1  RBC 3.93 3.75*  HGB 11.7* 10.8*  HCT 36.1 34.7*  MCV 91.9 92.5  MCH 29.8 28.8  MCHC 32.4 31.1  RDW 15.4 15.6*  PLT 240 211    BNPNo results for input(s): BNP, PROBNP in the last 168 hours.   DDimer No results for input(s): DDIMER in the last 168 hours.   Radiology    Dg Chest 2 View  Result Date: 09/14/2019 CLINICAL DATA:  Post lobectomy. EXAM: CHEST - 2 VIEW COMPARISON:  09/13/2019 FINDINGS: Right IJ central venous catheter unchanged. Lungs are adequately inflated with stable possible tiny right apical pneumothorax. Slight worsening right base opacification likely small amount of pleural fluid and atelectasis. Infection in the right base is possible. Subtle hazy density of the perihilar vessels. Stable mild cardiomegaly. Remainder of the exam is unchanged. IMPRESSION: 1. Mild stable cardiomegaly with suggestion of minimal vascular congestion. 2. Slight interval worsening opacification over the right base likely small effusion with atelectasis as infection is possible. 3. Stable  tiny right apical pneumothorax. Right IJ central venous catheter unchanged. Electronically Signed   By: Marin Olp M.D.   On: 09/14/2019 08:04   Dg Chest 1v Repeat Same Day  Result Date: 09/13/2019 CLINICAL DATA:  Chest tube removal. EXAM: CHEST - 1 VIEW SAME DAY COMPARISON:  09/13/2019 FINDINGS: Interval removal of right-sided chest tube. Tiny pneumothorax overlying the lateral right upper lobe appears unchanged. Right IJ catheter tip is in the distal SVC. Stable cardiac enlargement. Aortic atherosclerosis. Hiatal hernia. Small pleural effusions and mild interstitial edema, stable. Airspace disease and/or atelectasis in the right base is unchanged. IMPRESSION: 1. No change in tiny right apical pneumothorax status post chest tube removal. 2. No change in aeration to the lungs Electronically Signed   By: Kerby Moors M.D.   On: 09/13/2019 11:41   Dg Chest Port 1 View  Result Date: 09/13/2019 CLINICAL DATA:  Atelectasis. EXAM: PORTABLE CHEST 1 VIEW COMPARISON:  One-view chest x-ray 11 11/20 FINDINGS: The heart size is exaggerated by low lung volumes. Atherosclerotic changes are noted at the arch. Right IJ catheter is stable. A right-sided chest tube is in place. Small pneumothorax remains. Large hiatal hernia is present. Small effusions and bilateral airspace disease is similar the prior exam. IMPRESSION: 1. Stable small right apical pneumothorax with chest tube in place. 2. Stable small bilateral pleural effusions and bilateral airspace disease. 3. Large hiatal hernia. 4. Atherosclerosis. Electronically Signed   By: San Morelle M.D.   On: 09/13/2019 08:06    Cardiac Studies   Echo 06/2018 - Left ventricle: The cavity size was normal. Wall thickness was increased in a pattern of moderate LVH. Systolic function was vigorous. The estimated ejection fraction was in the range of 65% to 70%. Wall motion was normal; there were no regional wall motion abnormalities. Features are  consistent with a pseudonormal left ventricular filling pattern, with concomitant abnormal relaxation and increased filling pressure (grade 2 diastolic dysfunction). - Aortic valve: Mildly calcified annulus. Trileaflet. - Mitral valve: There was mild regurgitation. - Left atrium: The atrium was moderately dilated. - Right atrium: Central venous pressure (est): 3 mm Hg. - Atrial septum: No defect or patent foramen ovale was identified. - Tricuspid valve: There was trivial regurgitation. - Pulmonary arteries: Systolic pressure could not be accurately estimated. - Pericardium, extracardiac: There was no pericardial effusion.  Patient Profile     78 y.o. 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 Complicated by post op afib (similar to prior operation in 2019). Complicated by nausea/emesis, previously did not tolerate amiodarone.   Assessment & Plan    Nausea: -persistent, limits her oral intake -  trying to minimize possible medication interactions. Stopped metformin and pioglitazone yesterday -cut back on amiodarone, but nausea persists. Has a history of nausea with amiodarone in the past. Will stop this today to see if she has improvement in her symptoms  Postoperative atrial fibrillation: -has now been predominantly afib. Severe nausea, as above, precludes continuing amiodarone. As she is rate controlled, will continue with metoprolol. I agree with starting apixaban, as I suspect she will remain in atrial fibrillation as she recovers from surgery.  -well rate controlled now. Can consider outpatient cardioversion in several weeks if it persists. Would avoid TEE given history of Schatzki's ring. -she was having intermittent palpitations prior to admission. Thought to be PAC/PVC. Consider outpatient monitor vs. Long term anticoagulation for atrial fibrillation -chadsvasc=4 -now that she is on apixaban, will stop aspirin  Hyperlipidemia: has both pravastatin and  simvastatin listed as active meds. Per Dr. Kingsley Plan note, continue pravastatin. Will adjust today, change simvastatin to pravastatin.  IRBBB: chronic  Per primary team: S/P RLL lobectomy, found to have nonsmall cell lung cancer. Prior history of stage 1a squamous cell carcinoma s/p LLL resection in 2019 Type II diabetes: holding metformin/pioglitazone for now given nausea, as above.  For questions or updates, please contact [facility] Please consult [email or web address] for contact info under     Signed, Buford Dresser, MD  09/14/2019, 9:35 AM

## 2019-09-14 NOTE — Progress Notes (Signed)
4 Days Post-Op Procedure(s) (LRB): VIDEO ASSISTED THORACOSCOPY (VATS)/RIGHT LOWER LOBECTOMY (Right) Lymph Node Dissection (Right) Intercostal Nerve Block (Right) Subjective: C/o nausea, emesis, only able to eat small amounts SOB last night, better this AM  Objective: Vital signs in last 24 hours: Temp:  [97.7 F (36.5 C)-98.5 F (36.9 C)] 97.7 F (36.5 C) (11/13 0318) Pulse Rate:  [72-83] 81 (11/13 0400) Cardiac Rhythm: Atrial fibrillation;Bundle branch block (11/13 0733) Resp:  [10-97] 20 (11/13 0400) BP: (108-147)/(60-79) 129/64 (11/13 0400) SpO2:  [19 %-100 %] 96 % (11/13 0745) FiO2 (%):  [36 %] 36 % (11/12 1417)  Hemodynamic parameters for last 24 hours:    Intake/Output from previous day: 11/12 0701 - 11/13 0700 In: 460 [P.O.:460] Out: 330 [Urine:300; Chest Tube:30] Intake/Output this shift: No intake/output data recorded.  General appearance: alert, cooperative and no distress Neurologic: intact Heart: irregularly irregular rhythm Lungs: diminished breath sounds bibasilar Abdomen: NT/ND, hypoactive BS Wound: clean and dry  Lab Results: Recent Labs    09/12/19 0417  WBC 10.1  HGB 10.8*  HCT 34.7*  PLT 211   BMET:  Recent Labs    09/12/19 0417  NA 135  K 4.7  CL 100  CO2 25  GLUCOSE 187*  BUN 18  CREATININE 0.87  CALCIUM 8.5*    PT/INR: No results for input(s): LABPROT, INR in the last 72 hours. ABG    Component Value Date/Time   PHART 7.334 (L) 09/11/2019 0422   HCO3 25.5 09/11/2019 0422   O2SAT 96.5 09/11/2019 0422   CBG (last 3)  Recent Labs    09/13/19 1553 09/13/19 2128 09/14/19 0602  GLUCAP 122* 140* 143*    Assessment/Plan: S/P Procedure(s) (LRB): VIDEO ASSISTED THORACOSCOPY (VATS)/RIGHT LOWER LOBECTOMY (Right) Lymph Node Dissection (Right) Intercostal Nerve Block (Right) -CV- still in rate controlled atrial fib  Appreciate Cardiology's assistance  Will start Eliquis RESP_ bibasilar atelectasis- continue IS and  flutter  Continue nebs, wean O2 ENDO- CBG well controlled GI- still has nausea, only able to eat small amounts, has hiatal hernia and history of Schatzki's ring Ambulate   LOS: 4 days    [name] 09/14/2019

## 2019-09-15 ENCOUNTER — Inpatient Hospital Stay (HOSPITAL_COMMUNITY): Payer: Medicare Other

## 2019-09-15 DIAGNOSIS — I4819 Other persistent atrial fibrillation: Secondary | ICD-10-CM

## 2019-09-15 DIAGNOSIS — I4510 Unspecified right bundle-branch block: Secondary | ICD-10-CM

## 2019-09-15 LAB — BASIC METABOLIC PANEL
Anion gap: 11 (ref 5–15)
BUN: 19 mg/dL (ref 8–23)
CO2: 23 mmol/L (ref 22–32)
Calcium: 8.3 mg/dL — ABNORMAL LOW (ref 8.9–10.3)
Chloride: 100 mmol/L (ref 98–111)
Creatinine, Ser: 0.75 mg/dL (ref 0.44–1.00)
GFR calc Af Amer: >60 mL/min (ref >60)
GFR calc non Af Amer: >60 mL/min (ref >60)
Glucose, Bld: 141 mg/dL — ABNORMAL HIGH (ref 70–99)
Potassium: 4 mmol/L (ref 3.5–5.1)
Sodium: 134 mmol/L — ABNORMAL LOW (ref 135–145)

## 2019-09-15 LAB — CBC
HCT: 28.6 % — ABNORMAL LOW (ref 36.0–46.0)
Hemoglobin: 9.4 g/dL — ABNORMAL LOW (ref 12.0–15.0)
MCH: 29.2 pg (ref 26.0–34.0)
MCHC: 32.9 g/dL (ref 30.0–36.0)
MCV: 88.8 fL (ref 80.0–100.0)
Platelets: 257 10*3/uL (ref 150–400)
RBC: 3.22 MIL/uL — ABNORMAL LOW (ref 3.87–5.11)
RDW: 15.4 % (ref 11.5–15.5)
WBC: 5.2 10*3/uL (ref 4.0–10.5)
nRBC: 0.6 % — ABNORMAL HIGH (ref 0.0–0.2)

## 2019-09-15 LAB — GLUCOSE, CAPILLARY
Glucose-Capillary: 130 mg/dL — ABNORMAL HIGH (ref 70–99)
Glucose-Capillary: 132 mg/dL — ABNORMAL HIGH (ref 70–99)
Glucose-Capillary: 145 mg/dL — ABNORMAL HIGH (ref 70–99)
Glucose-Capillary: 148 mg/dL — ABNORMAL HIGH (ref 70–99)

## 2019-09-15 MED ORDER — BENZONATATE 100 MG PO CAPS
100.0000 mg | ORAL_CAPSULE | Freq: Three times a day (TID) | ORAL | Status: DC | PRN
  Administered 2019-09-15 (×2): 100 mg via ORAL
  Filled 2019-09-15 (×3): qty 1

## 2019-09-15 NOTE — Progress Notes (Signed)
Progress Note  Patient Name: Monica [name] Date of Encounter: 09/15/2019  Primary Cardiologist: Candee Furbish, MD  Subjective   Nausea is better today. Wants to go home. Remains in afib but well rate-controlled. Off amiodarone.  Inpatient Medications    Scheduled Meds:  acetaminophen  1,000 mg Oral Q6H   Or   acetaminophen (TYLENOL) oral liquid 160 mg/5 mL  1,000 mg Oral Q6H   apixaban  5 mg Oral BID   bisacodyl  10 mg Oral Daily   Chlorhexidine Gluconate Cloth  6 each Topical Daily   citalopram  20 mg Oral Daily   insulin aspart  0-15 Units Subcutaneous TID WC   ipratropium-albuterol  3 mL Nebulization BID   mouth rinse  15 mL Mouth Rinse BID   metoCLOPramide (REGLAN) injection  10 mg Intravenous Q6H   metoprolol tartrate  25 mg Oral BID   pravastatin  40 mg Oral q1800   senna-docusate  1 tablet Oral QHS   Continuous Infusions:  sodium chloride 10 mL/hr at 09/13/19 0902   PRN Meds: diphenhydrAMINE **OR** diphenhydrAMINE, guaiFENesin, ipratropium-albuterol, naloxone **AND** sodium chloride flush, ondansetron (ZOFRAN) IV, oxyCODONE, polyvinyl alcohol, traMADol   Vital Signs    Vitals:   09/14/19 2325 09/15/19 0311 09/15/19 0823 09/15/19 0842  BP: 134/74 129/63 (!) 150/67   Pulse: 69 64 77   Resp: 15 17 15    Temp: 98.3 F (36.8 C) 98.3 F (36.8 C) 98.3 F (36.8 C)   TempSrc: Oral Oral Oral   SpO2: 100% 100% 100% 100%    Intake/Output Summary (Last 24 hours) at 09/15/2019 0844 Last data filed at 09/15/2019 0800 Gross per 24 hour  Intake 350 ml  Output 175 ml  Net 175 ml   Last 3 Weights 09/06/2019 08/28/2019 08/15/2019  Weight (lbs) 175 lb 7 oz 170 lb 174 lb 12.8 oz  Weight (kg) 79.578 kg 77.111 kg 79.289 kg      Telemetry    Afib with CVR in the 70's - Personally Reviewed  ECG    N/A - Personally Reviewed  Physical Exam   General appearance: alert and no distress Lungs: diminished breath sounds RLL Heart: regular rate and rhythm,  S1, S2 normal, no murmur, click, rub or gallop Extremities: extremities normal, atraumatic, no cyanosis or edema Pulses: 2+ and symmetric Skin: Skin color, texture, turgor normal. No rashes or lesions Psych: Pleasant  Labs    High Sensitivity Troponin:  No results for input(s): TROPONINIHS in the last 720 hours.    Chemistry Recent Labs  Lab 09/11/19 0345 09/12/19 0417 09/15/19 0240  NA 135 135 134*  K 4.4 4.7 4.0  CL 100 100 100  CO2 24 25 23   GLUCOSE 133* 187* 141*  BUN 15 18 19   CREATININE 0.87 0.87 0.75  CALCIUM 8.8* 8.5* 8.3*  PROT  --  6.2*  --   ALBUMIN  --  2.9*  --   AST  --  48*  --   ALT  --  34  --   ALKPHOS  --  53  --   BILITOT  --  0.8  --   GFRNONAA >60 >60 >60  GFRAA >60 >60 >60  ANIONGAP 11 10 11      Hematology Recent Labs  Lab 09/11/19 0345 09/12/19 0417 09/15/19 0240  WBC 12.5* 10.1 5.2  RBC 3.93 3.75* 3.22*  HGB 11.7* 10.8* 9.4*  HCT 36.1 34.7* 28.6*  MCV 91.9 92.5 88.8  MCH 29.8 28.8 29.2  MCHC 32.4  31.1 32.9  RDW 15.4 15.6* 15.4  PLT 240 211 257    BNPNo results for input(s): BNP, PROBNP in the last 168 hours.   DDimer No results for input(s): DDIMER in the last 168 hours.   Radiology    Dg Chest 2 View  Result Date: 09/15/2019 CLINICAL DATA:  Status post right lower lobectomy. EXAM: CHEST - 2 VIEW COMPARISON:  09/14/2019 FINDINGS: Interval removal of right chest tube. There is a small right apical pneumothorax which appears increased from previous exam measuring approximately 6 mm in thickness on today's study. Normal heart size. Right midlung and right base airspace opacities are unchanged. IMPRESSION: 1. Increase in size of small right apical pneumothorax status post right chest tube removal. 2. Stable right midlung and right base airspace opacities. Electronically Signed   By: Kerby Moors M.D.   On: 09/15/2019 06:03   Dg Chest 2 View  Result Date: 09/14/2019 CLINICAL DATA:  Post lobectomy. EXAM: CHEST - 2 VIEW COMPARISON:   09/13/2019 FINDINGS: Right IJ central venous catheter unchanged. Lungs are adequately inflated with stable possible tiny right apical pneumothorax. Slight worsening right base opacification likely small amount of pleural fluid and atelectasis. Infection in the right base is possible. Subtle hazy density of the perihilar vessels. Stable mild cardiomegaly. Remainder of the exam is unchanged. IMPRESSION: 1. Mild stable cardiomegaly with suggestion of minimal vascular congestion. 2. Slight interval worsening opacification over the right base likely small effusion with atelectasis as infection is possible. 3. Stable tiny right apical pneumothorax. Right IJ central venous catheter unchanged. Electronically Signed   By: Marin Olp M.D.   On: 09/14/2019 08:04   Dg Chest 1v Repeat Same Day  Result Date: 09/13/2019 CLINICAL DATA:  Chest tube removal. EXAM: CHEST - 1 VIEW SAME DAY COMPARISON:  09/13/2019 FINDINGS: Interval removal of right-sided chest tube. Tiny pneumothorax overlying the lateral right upper lobe appears unchanged. Right IJ catheter tip is in the distal SVC. Stable cardiac enlargement. Aortic atherosclerosis. Hiatal hernia. Small pleural effusions and mild interstitial edema, stable. Airspace disease and/or atelectasis in the right base is unchanged. IMPRESSION: 1. No change in tiny right apical pneumothorax status post chest tube removal. 2. No change in aeration to the lungs Electronically Signed   By: Kerby Moors M.D.   On: 09/13/2019 11:41    Cardiac Studies   Echo 06/2018 - Left ventricle: The cavity size was normal. Wall thickness was increased in a pattern of moderate LVH. Systolic function was vigorous. The estimated ejection fraction was in the range of 65% to 70%. Wall motion was normal; there were no regional wall motion abnormalities. Features are consistent with a pseudonormal left ventricular filling pattern, with concomitant abnormal relaxation and increased  filling pressure (grade 2 diastolic dysfunction). - Aortic valve: Mildly calcified annulus. Trileaflet. - Mitral valve: There was mild regurgitation. - Left atrium: The atrium was moderately dilated. - Right atrium: Central venous pressure (est): 3 mm Hg. - Atrial septum: No defect or patent foramen ovale was identified. - Tricuspid valve: There was trivial regurgitation. - Pulmonary arteries: Systolic pressure could not be accurately estimated. - Pericardium, extracardiac: There was no pericardial effusion.  Patient Profile     78 y.o. 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 Complicated by post op afib (similar to prior operation in 2019). Complicated by nausea/emesis, previously did not tolerate amiodarone.   Assessment & Plan    Nausea: -improved - will need to try to eat and see if  it stays down today.  Postoperative atrial fibrillation: -remains in rate-controlled afib -On Eliquis - amiodarone stopped d/t nausea/ -Possible DCCV after 3-4 weeks of uninterrupted anticoagulation  Hyperlipidemia:  -continue pravastatin.  IRBBB: chronic  Per primary team: S/P RLL lobectomy, found to have nonsmall cell lung cancer. 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  For questions or updates, please contact [place] Please consult [email or web address] for contact info under     Pixie Casino, MD, FACC, [place] Director of the Advanced Lipid Disorders &  Cardiovascular Risk Reduction Clinic Diplomate of the American Board of Clinical Lipidology Attending Cardiologist  Direct Dial: [phone]   Fax: [phone]  Website:  [email or web address]  Pixie Casino, MD  09/15/2019, 8:44 AM

## 2019-09-15 NOTE — Progress Notes (Addendum)
      [address]       [place] [address] [address]             [phone]      5 Days Post-Op Procedure(s) (LRB): VIDEO ASSISTED THORACOSCOPY (VATS)/RIGHT LOWER LOBECTOMY (Right) Lymph Node Dissection (Right) Intercostal Nerve Block (Right)   Subjective:  Patient continues to have nausea at times.  Wants to go home and states she thinks she would eat better at home.    Objective: Vital signs in last 24 hours: Temp:  [98.1 F (36.7 C)-98.8 F (37.1 C)] 98.3 F (36.8 C) (11/14 0823) Pulse Rate:  [64-79] 77 (11/14 0823) Cardiac Rhythm: (P) Atrial fibrillation (11/14 0800) Resp:  [15-20] 15 (11/14 0823) BP: (129-150)/(63-80) 150/67 (11/14 0823) SpO2:  [97 %-100 %] 100 % (11/14 0842)  Intake/Output from previous day: 11/13 0701 - 11/14 0700 In: 550 [P.O.:550] Out: 375 [Urine:375]  General appearance: alert, cooperative and no distress Heart: irregularly irregular rhythm Lungs: clear to auscultation bilaterally Abdomen: soft, non-tender; bowel sounds normal; no masses,  no organomegaly Wound: clean and dry  Lab Results: Recent Labs    09/15/19 0240  WBC 5.2  HGB 9.4*  HCT 28.6*  PLT 257   BMET:  Recent Labs    09/15/19 0240  NA 134*  K 4.0  CL 100  CO2 23  GLUCOSE 141*  BUN 19  CREATININE 0.75  CALCIUM 8.3*    PT/INR: No results for input(s): LABPROT, INR in the last 72 hours. ABG    Component Value Date/Time   PHART 7.334 (L) 09/11/2019 0422   HCO3 25.5 09/11/2019 0422   O2SAT 96.5 09/11/2019 0422   CBG (last 3)  Recent Labs    09/14/19 1537 09/14/19 2157 09/15/19 0600  GLUCAP 169* 123* 130*    Assessment/Plan: S/P Procedure(s) (LRB): VIDEO ASSISTED THORACOSCOPY (VATS)/RIGHT LOWER LOBECTOMY (Right) Lymph Node Dissection (Right) Intercostal Nerve Block (Right)  1. CV- A. Fib, rate controlled- off amiodarone due to nausea, continue Eliquis, cardiology is managing 2. Pulm- no acute issues, weaning oxygen as able, CT's out good  use of IS, nebs for atelectasis 3. GI- persistent nausea, amiodarone has been stopped, will try to schedule zofran prior to meals to keep nausea controlled, encouraged oral intake as able 4. Dispo- patient stable, in rate controlled A. Fib on Eliquis per Cardiology, will try to improve oral intake today, patient will be ready for d/c once she can keep food/liquids down   LOS: 5 days    Ellwood Handler, PA-C  09/15/2019  Chart reviewed, patient examined, agree with above. She says that her nausea is better today but has only eaten a couple saltine crackers. I told her that if she continues to eat today she could be able to go home in the morning.

## 2019-09-15 NOTE — Progress Notes (Signed)
Pt expresses some relief from nausea using Zofran every 6 hours. Sats 88 - 92 on room air throughout shift.

## 2019-09-16 DIAGNOSIS — I9789 Other postprocedural complications and disorders of the circulatory system, not elsewhere classified: Secondary | ICD-10-CM

## 2019-09-16 DIAGNOSIS — I10 Essential (primary) hypertension: Secondary | ICD-10-CM

## 2019-09-16 DIAGNOSIS — I4819 Other persistent atrial fibrillation: Secondary | ICD-10-CM

## 2019-09-16 DIAGNOSIS — E0865 Diabetes mellitus due to underlying condition with hyperglycemia: Secondary | ICD-10-CM

## 2019-09-16 LAB — GLUCOSE, CAPILLARY: Glucose-Capillary: 150 mg/dL — ABNORMAL HIGH (ref 70–99)

## 2019-09-16 MED ORDER — TRAMADOL HCL 50 MG PO TABS: 50.0000 mg | ORAL_TABLET | Freq: Four times a day (QID) | ORAL | 0 refills | Status: DC | PRN

## 2019-09-16 MED ORDER — METOPROLOL TARTRATE 25 MG PO TABS: 25.0000 mg | ORAL_TABLET | Freq: Two times a day (BID) | ORAL | 3 refills | Status: DC

## 2019-09-16 MED ORDER — LOSARTAN POTASSIUM 50 MG PO TABS: 50.0000 mg | ORAL_TABLET | Freq: Every day | ORAL | 3 refills | Status: DC

## 2019-09-16 MED ORDER — DM-GUAIFENESIN ER 30-600 MG PO TB12: 1.0000 | ORAL_TABLET | Freq: Two times a day (BID) | ORAL | Status: DC

## 2019-09-16 MED ORDER — ONDANSETRON HCL 4 MG PO TABS: 4.0000 mg | ORAL_TABLET | Freq: Three times a day (TID) | ORAL | 0 refills | Status: DC | PRN

## 2019-09-16 MED ORDER — APIXABAN 5 MG PO TABS: 5.0000 mg | ORAL_TABLET | Freq: Two times a day (BID) | ORAL | 3 refills | Status: DC

## 2019-09-16 MED ORDER — LOSARTAN POTASSIUM 50 MG PO TABS
50.0000 mg | ORAL_TABLET | Freq: Every day | ORAL | Status: DC
  Filled 2019-09-16: qty 1

## 2019-09-16 NOTE — Progress Notes (Signed)
Progress Note  Patient Name: Monica [name] Date of Encounter: 09/16/2019  Primary Cardiologist: Candee Furbish, MD  Subjective   Afib with RVR at times overnight.   Inpatient Medications    Scheduled Meds: . apixaban  5 mg Oral BID  . bisacodyl  10 mg Oral Daily  . citalopram  20 mg Oral Daily  . insulin aspart  0-15 Units Subcutaneous TID WC  . ipratropium-albuterol  3 mL Nebulization BID  . mouth rinse  15 mL Mouth Rinse BID  . metoprolol tartrate  25 mg Oral BID  . pravastatin  40 mg Oral q1800  . senna-docusate  1 tablet Oral QHS   Continuous Infusions: . sodium chloride 10 mL/hr at 09/13/19 0902   PRN Meds: benzonatate, diphenhydrAMINE **OR** diphenhydrAMINE, guaiFENesin, ipratropium-albuterol, naloxone **AND** sodium chloride flush, ondansetron (ZOFRAN) IV, oxyCODONE, polyvinyl alcohol, traMADol   Vital Signs    Vitals:   09/15/19 2328 09/16/19 0312 09/16/19 0333 09/16/19 0755  BP: (!) 147/68  (!) 166/87 (!) 164/83  Pulse: 86  87 97  Resp: 20  18 (!) 28  Temp: 98.3 F (36.8 C) 97.6 F (36.4 C) 97.6 F (36.4 C) 97.6 F (36.4 C)  TempSrc: Oral Oral Oral   SpO2: 92%  95% 92%    Intake/Output Summary (Last 24 hours) at 09/16/2019 0847 Last data filed at 09/15/2019 1200 Gross per 24 hour  Intake 240 ml  Output -  Net 240 ml   Last 3 Weights 09/06/2019 08/28/2019 08/15/2019  Weight (lbs) 175 lb 7 oz 170 lb 174 lb 12.8 oz  Weight (kg) 79.578 kg 77.111 kg 79.289 kg      Telemetry    Afib with some RVR overnight - Personally Reviewed  ECG    N/A - Personally Reviewed  Physical Exam   General appearance: alert and no distress Lungs: diminished breath sounds RLL Heart: regular rate and rhythm, S1, S2 normal, no murmur, click, rub or gallop Extremities: extremities normal, atraumatic, no cyanosis or edema Pulses: 2+ and symmetric Skin: Skin color, texture, turgor normal. No rashes or lesions Psych: Pleasant  Labs    High Sensitivity Troponin:   No results for input(s): TROPONINIHS in the last 720 hours.    Chemistry Recent Labs  Lab 09/11/19 0345 09/12/19 0417 09/15/19 0240  NA 135 135 134*  K 4.4 4.7 4.0  CL 100 100 100  CO2 24 25 23   GLUCOSE 133* 187* 141*  BUN 15 18 19   CREATININE 0.87 0.87 0.75  CALCIUM 8.8* 8.5* 8.3*  PROT  --  6.2*  --   ALBUMIN  --  2.9*  --   AST  --  48*  --   ALT  --  34  --   ALKPHOS  --  53  --   BILITOT  --  0.8  --   GFRNONAA >60 >60 >60  GFRAA >60 >60 >60  ANIONGAP 11 10 11      Hematology Recent Labs  Lab 09/11/19 0345 09/12/19 0417 09/15/19 0240  WBC 12.5* 10.1 5.2  RBC 3.93 3.75* 3.22*  HGB 11.7* 10.8* 9.4*  HCT 36.1 34.7* 28.6*  MCV 91.9 92.5 88.8  MCH 29.8 28.8 29.2  MCHC 32.4 31.1 32.9  RDW 15.4 15.6* 15.4  PLT 240 211 257    BNPNo results for input(s): BNP, PROBNP in the last 168 hours.   DDimer No results for input(s): DDIMER in the last 168 hours.   Radiology    Dg Chest 2 View  Result Date: 09/15/2019 CLINICAL DATA:  Status post right lower lobectomy. EXAM: CHEST - 2 VIEW COMPARISON:  09/14/2019 FINDINGS: Interval removal of right chest tube. There is a small right apical pneumothorax which appears increased from previous exam measuring approximately 6 mm in thickness on today's study. Normal heart size. Right midlung and right base airspace opacities are unchanged. IMPRESSION: 1. Increase in size of small right apical pneumothorax status post right chest tube removal. 2. Stable right midlung and right base airspace opacities. Electronically Signed   By: Kerby Moors M.D.   On: 09/15/2019 06:03    Cardiac Studies   Echo 06/2018 - Left ventricle: The cavity size was normal. Wall thickness was increased in a pattern of moderate LVH. Systolic function was vigorous. The estimated ejection fraction was in the range of 65% to 70%. Wall motion was normal; there were no regional wall motion abnormalities. Features are consistent with a pseudonormal  left ventricular filling pattern, with concomitant abnormal relaxation and increased filling pressure (grade 2 diastolic dysfunction). - Aortic valve: Mildly calcified annulus. Trileaflet. - Mitral valve: There was mild regurgitation. - Left atrium: The atrium was moderately dilated. - Right atrium: Central venous pressure (est): 3 mm Hg. - Atrial septum: No defect or patent foramen ovale was identified. - Tricuspid valve: There was trivial regurgitation. - Pulmonary arteries: Systolic pressure could not be accurately estimated. - Pericardium, extracardiac: There was no pericardial effusion.  Patient Profile     78 y.o. 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 Complicated by post op afib (similar to prior operation in 2019). Complicated by nausea/emesis, previously did not tolerate amiodarone.   Assessment & Plan    Nausea: - improved today, at cheerios for breakfast, wants to go home  Postoperative atrial fibrillation: -afib rate increased overnight.  -On Eliquis - amiodarone stopped d/t nausea/ -Possible DCCV after 3-4 weeks of uninterrupted anticoagulation  Hyperlipidemia:  -continue pravastatin.  Hypertension: uncontrolled, remains in 832-919'T systolic -add losartan 50 mg daily, will also be renal protective for diabetes  Diabetes: -restart home diabetes medications at discharge as she is now eating  Per primary team: S/P RLL lobectomy, found to have nonsmall cell lung cancer. 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

## 2019-09-16 NOTE — Progress Notes (Addendum)
      [address]       [place] [address]             [phone]      6 Days Post-Op Procedure(s) (LRB): VIDEO ASSISTED THORACOSCOPY (VATS)/RIGHT LOWER LOBECTOMY (Right) Lymph Node Dissection (Right) Intercostal Nerve Block (Right)   Subjective:  Patient doing much better.  She was able to eat and drink some yesterday.  She continues to cough and is having issues breaking up congestion.  + ambulation  + BM  Objective: Vital signs in last 24 hours: Temp:  [97.6 F (36.4 C)-98.6 F (37 C)] 97.6 F (36.4 C) (11/15 0755) Pulse Rate:  [75-97] 93 (11/15 0800) Cardiac Rhythm: Atrial fibrillation (11/15 0800) Resp:  [18-28] 25 (11/15 0800) BP: (146-166)/(68-89) 164/83 (11/15 0755) SpO2:  [90 %-98 %] 92 % (11/15 0904)  Intake/Output from previous day: 11/14 0701 - 11/15 0700 In: 240 [P.O.:240] Out: -  Intake/Output this shift: Total I/O In: 200 [P.O.:200] Out: -   General appearance: alert, cooperative and no distress Heart: irregularly irregular rhythm Lungs: clear to auscultation bilaterally Abdomen: soft, non-tender; bowel sounds normal; no masses,  no organomegaly Wound: clean and dry  Lab Results: Recent Labs    09/15/19 0240  WBC 5.2  HGB 9.4*  HCT 28.6*  PLT 257   BMET:  Recent Labs    09/15/19 0240  NA 134*  K 4.0  CL 100  CO2 23  GLUCOSE 141*  BUN 19  CREATININE 0.75  CALCIUM 8.3*    PT/INR: No results for input(s): LABPROT, INR in the last 72 hours. ABG    Component Value Date/Time   PHART 7.334 (L) 09/11/2019 0422   HCO3 25.5 09/11/2019 0422   O2SAT 96.5 09/11/2019 0422   CBG (last 3)  Recent Labs    09/15/19 1612 09/15/19 2043 09/16/19 0605  GLUCAP 145* 148* 150*    Assessment/Plan: S/P Procedure(s) (LRB): VIDEO ASSISTED THORACOSCOPY (VATS)/RIGHT LOWER LOBECTOMY (Right) Lymph Node Dissection (Right) Intercostal Nerve Block (Right)  1. CV- PAF, not on Amiodarone due to nausea, continue Lopressor, Cozaar,  Eliquis... for possible DCCV if A. Fib persists in a few weeks 2. Pulm- no new issues, continues to have issues expectorating sputum, will add Mucinex, continue IS 3. GI-nausea controlled, patient is now able to eat without difficulty 4. Dispo- patient stable, continue Lopressor, Eliquis for A. Fib, will add Mucinex for coughing, continue zofran as needed for nausea, she is medically stable for discharge home today   LOS: 6 days    Ellwood Handler, PA-C  09/16/2019   Chart reviewed, patient examined, agree with above. She feels well and nausea resolved.  Plan home today.

## 2019-09-16 NOTE — Care Management (Signed)
Provided patient and spouse with Eliquis card, copay is $47 per pharmacist at [facility]

## 2019-09-16 NOTE — Progress Notes (Signed)
Pt ambulated 150 feet in hallway with RN. Sats remained >90% on room air. Pt has home walker and lives in single floor home.

## 2019-09-16 NOTE — Plan of Care (Signed)

## 2019-09-20 ENCOUNTER — Other Ambulatory Visit: Payer: Self-pay | Admitting: *Deleted

## 2019-09-20 NOTE — Progress Notes (Signed)
The proposed treatment discussed in cancer conference 09/20/19 is for discussion purpose only and is not a binding recommendation.  The patient was not physically examined nor present for their treatment options.  Therefore, final treatment plans cannot be decided.

## 2019-09-25 ENCOUNTER — Ambulatory Visit: Payer: Medicare Other | Admitting: Nurse Practitioner

## 2019-10-01 ENCOUNTER — Other Ambulatory Visit: Payer: Self-pay | Admitting: Thoracic Surgery (Cardiothoracic Vascular Surgery)

## 2019-10-01 DIAGNOSIS — C3432 Malignant neoplasm of lower lobe, left bronchus or lung: Secondary | ICD-10-CM

## 2019-10-02 ENCOUNTER — Other Ambulatory Visit: Payer: Self-pay

## 2019-10-02 ENCOUNTER — Ambulatory Visit (INDEPENDENT_AMBULATORY_CARE_PROVIDER_SITE_OTHER): Payer: Self-pay | Admitting: Thoracic Surgery (Cardiothoracic Vascular Surgery)

## 2019-10-02 ENCOUNTER — Ambulatory Visit
Admission: RE | Admit: 2019-10-02 | Discharge: 2019-10-02 | Disposition: A | Discharge status: Home / Self Care | Payer: Medicare Other | Source: Ambulatory Visit | Attending: Thoracic Surgery (Cardiothoracic Vascular Surgery) | Admitting: Thoracic Surgery (Cardiothoracic Vascular Surgery)

## 2019-10-02 ENCOUNTER — Ambulatory Visit (HOSPITAL_COMMUNITY): Payer: Medicare Other | Admitting: Hematology

## 2019-10-02 ENCOUNTER — Encounter: Payer: Self-pay | Admitting: Thoracic Surgery (Cardiothoracic Vascular Surgery)

## 2019-10-02 VITALS — BP 156/81 | HR 64 | Temp 97.6°F | Resp 16 | Ht 66.0 in | Wt 165.0 lb

## 2019-10-02 DIAGNOSIS — C3432 Malignant neoplasm of lower lobe, left bronchus or lung: Secondary | ICD-10-CM

## 2019-10-02 DIAGNOSIS — Z09 Encounter for follow-up examination after completed treatment for conditions other than malignant neoplasm: Secondary | ICD-10-CM

## 2019-10-02 DIAGNOSIS — Z902 Acquired absence of lung [part of]: Secondary | ICD-10-CM

## 2019-10-02 NOTE — Progress Notes (Signed)
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  Postoperatively she developed atrial fibrillation which resolved with amiodarone.  This summer she had a PET/CT which showed a new 9 mm pulmonary nodule with mild uptake.  On repeat the nodule is increased in size and had an SUV of 5.6.  I did a right VATS for right lower lobectomy on 09/11/2019.  The nodule was a squamous cell carcinoma.  The lymph nodes were negative.  She says that this operation was harder on her than the first 1.  She had a lot of nausea while she was in the hospital.  She again had atrial fibrillation and was treated with amiodarone.  Her nausea resolved once the amiodarone was stopped.  She says she had more pain with the operation this time, but is not taking any narcotics since she left the hospital.  She has noted a greater impact on her respiratory capacity, which was expected.  Past Medical History:  Diagnosis Date  . Cancer (HCC)    NHL, Malt Lymphoma  . Depression   . DM (diabetes mellitus) ([facility])    TYPE  2  . Dysphagia   . Dysrhythmia    History of palpatations  . GERD (gastroesophageal reflux disease)    INGESTION   . Heart palpitations   . Hiatal hernia   . Hyperlipidemia   . Interstitial cystitis   . Lung cancer, lower lobe ([facility]) 07/21/2018   Left lower lobe, stage Ia squamous cell carcinoma  . 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 Sinusitis     Current Outpatient Medications   Medication Sig Dispense Refill  . acetaminophen (TYLENOL) 325 MG tablet Take 650 mg by mouth every 6 (six) hours as needed for moderate pain or headache.    Marland Kitchen apixaban (ELIQUIS) 5 MG TABS tablet Take 1 tablet (5 mg total) by mouth 2 (two) times daily. 60 tablet 3  . citalopram (CELEXA) 20 MG tablet Take 20 mg by mouth daily.     Marland Kitchen dextromethorphan-guaiFENesin (MUCINEX DM) 30-600 MG 12hr tablet Take 1 tablet by mouth 2 (two) times daily.    Marland Kitchen losartan (COZAAR) 50 MG tablet Take 1 tablet (50 mg total) by mouth daily. 30 tablet 3  . metFORMIN (GLUCOPHAGE-XR) 500 MG 24 hr tablet Take 500 mg by mouth daily.     . metoprolol tartrate (LOPRESSOR) 25 MG tablet Take 1 tablet (25 mg total) by mouth 2 (two) times daily. 60 tablet 3  . Multiple Vitamin (MULTIVITAMIN WITH MINERALS) TABS tablet Take 1 tablet by mouth daily.    . ondansetron (ZOFRAN) 4 MG tablet Take 1 tablet (4 mg total) by mouth every 8 (eight) hours as needed for nausea or vomiting. 30 tablet 0  . pioglitazone (ACTOS) 15 MG tablet Take 15 mg by mouth daily.    . Polyvinyl Alcohol-Povidone PF (REFRESH) 1.4-0.6 % SOLN Place  1 drop into both eyes as needed (Dry eyes).     . pravastatin (PRAVACHOL) 40 MG tablet Take 40 mg by mouth at bedtime.     Marland Kitchen aspirin EC 81 MG tablet Take 81 mg by mouth at bedtime.     No current facility-administered medications for this visit.     Physical Exam BP (!) 156/81 (BP Location: Right Arm, Patient Position: Sitting, Cuff Size: Normal)   Pulse 64   Temp 97.6 F (36.4 C)   Resp 16   Ht 5\' 6"  (1.676 m)   Wt 165 lb (74.8 kg)   SpO2 95% Comment: RA  BMI 26.63 kg/m  Monica [name] is a 78 year old woman in no acute distress Alert and oriented x3 with no focal deficits Cardiac irregular, normal rate Lungs absent breath sounds both bases, otherwise clear Incisions well-healed  Diagnostic Tests: CHEST - 2 VIEW  COMPARISON:  09/15/2019  FINDINGS: Large hiatal hernia.  Upper normal heart size.   Atherosclerotic calcification and mild tortuosity of thoracic aorta.  Pulmonary vascular congestion.  Resolution of RIGHT apex pneumothorax seen on previous exam.  Improved RIGHT basilar infiltrate with small RIGHT pleural effusion.  Minimal atelectasis LEFT base.  Remaining lungs clear.  No acute osseous findings.  IMPRESSION: Large hiatal hernia.  Improved RIGHT lung infiltrates with persistent RIGHT pleural effusion.   Electronically Signed   By: Lavonia Dana M.D.   On: 10/02/2019 12:57 I personally reviewed the chest x-ray images and concur with the findings noted above  Impression: Monica [name] is a 78 year old woman with a history of tobacco abuse and COPD.  She had a left lower lobectomy for stage Ia squamous cell carcinoma about a year and a half ago.  She then was found to have a new right lower lobe nodule.  I did a right lower lobectomy on 09/11/2019.  That also turned out to be a squamous cell carcinoma.  All her lymph nodes were negative.  She complains of more respiratory limitations with this surgery than her previous one.  That is not surprising in fact it is to be expected.  This was a proportionally larger loss of functional lung tissue than the previous operation.  She says that she was having a lot of pain initially but is not taking any narcotics and her pain is improving.  She is only 3 weeks out from surgery so that is actually excellent progress.  I recommended we refer to pulmonary rehabilitation to help with her exercise tolerance she is not sure if she wants to do that.  There are no restrictions on her activities but she should build into new activities gradually.  She has an appointment scheduled with Dr. Delton Coombes on Thursday.  Postoperative atrial fibrillation-her rate is controlled but her heart rate is still irregular today.  We do not have a rhythm strip.  She is on Eliquis.  She has a follow-up with cardiology at the end of the  month.  Plan: Follow-up as scheduled with Dr. Delton Coombes Follow-up as scheduled with cardiology Return in 2 months with PA lateral chest x-ray  [name], MD [facility] [phone]

## 2019-10-03 ENCOUNTER — Other Ambulatory Visit: Payer: Self-pay | Admitting: *Deleted

## 2019-10-03 ENCOUNTER — Encounter: Payer: Self-pay | Admitting: *Deleted

## 2019-10-03 DIAGNOSIS — Z902 Acquired absence of lung [part of]: Secondary | ICD-10-CM

## 2019-10-03 DIAGNOSIS — C3432 Malignant neoplasm of lower lobe, left bronchus or lung: Secondary | ICD-10-CM

## 2019-10-03 NOTE — Progress Notes (Signed)
Monica [name] was in the office on 10/02/19 s/p RLLobectomy on 09/10/19.  Dr. Roxan Hockey suggested referral to pulmonary rehab to assist with exercise tolerance.  Before I placed the referral, I called to see if she would like to attend rehab at [facility] since she lived in [place].  She informed me that she has decided that she does NOT want to participate in the program.  She can notify the office at any time if she changes her mind.

## 2019-10-04 ENCOUNTER — Inpatient Hospital Stay (HOSPITAL_COMMUNITY): Payer: Medicare Other | Attending: Hematology | Admitting: Hematology

## 2019-10-04 ENCOUNTER — Encounter (HOSPITAL_COMMUNITY): Payer: Self-pay | Admitting: Hematology

## 2019-10-04 ENCOUNTER — Other Ambulatory Visit: Payer: Self-pay

## 2019-10-04 VITALS — BP 110/61 | HR 79 | Temp 96.8°F | Resp 18 | Wt 164.0 lb

## 2019-10-04 DIAGNOSIS — Z79899 Other long term (current) drug therapy: Secondary | ICD-10-CM | POA: Insufficient documentation

## 2019-10-04 DIAGNOSIS — C884 Extranodal marginal zone B-cell lymphoma of mucosa-associated lymphoid tissue [MALT-lymphoma]: Secondary | ICD-10-CM | POA: Diagnosis not present

## 2019-10-04 DIAGNOSIS — C3432 Malignant neoplasm of lower lobe, left bronchus or lung: Secondary | ICD-10-CM | POA: Insufficient documentation

## 2019-10-04 DIAGNOSIS — F17210 Nicotine dependence, cigarettes, uncomplicated: Secondary | ICD-10-CM | POA: Diagnosis not present

## 2019-10-04 DIAGNOSIS — E119 Type 2 diabetes mellitus without complications: Secondary | ICD-10-CM | POA: Diagnosis not present

## 2019-10-04 NOTE — Patient Instructions (Signed)
[place] at [facility] Discharge Instructions  You were seen today by Dr. Delton Coombes. He went over your recent test results. He will repeat your CT scan in 3 months. He will see you back in 3 months for labs and follow up.   Thank you for choosing [place] at [facility] to provide your oncology and hematology care.  To afford each patient quality time with our provider, please arrive at least 15 minutes before your scheduled appointment time.   If you have a lab appointment with the [facility] please come in thru the  Main Entrance and check in at the main information desk  You need to re-schedule your appointment should you arrive 10 or more minutes late.  We strive to give you quality time with our providers, and arriving late affects you and other patients whose appointments are after yours.  Also, if you no show three or more times for appointments you may be dismissed from the clinic at the providers discretion.     Again, thank you for choosing [facility]  Our hope is that these requests will decrease the amount of time that you wait before being seen by our physicians.       _____________________________________________________________  Should you have questions after your visit to [facility], please contact our office at [phone] between the hours of 8:00 a.m. and 4:30 p.m.  Voicemails left after 4:00 p.m. will not be returned until the following business day.  For prescription refill requests, have your pharmacy contact our office and allow 72 hours.    Cancer Center Support Programs:   > Cancer Support Group  2nd Tuesday of the month 1pm-2pm, Journey Room

## 2019-10-04 NOTE — Assessment & Plan Note (Signed)
1.  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 -PET scan from 04/30/2019 showed new hypermetabolic focus in the right lower lobe, 0.9 cm, SUV 2.6.  Right infrahilar accentuated metabolic activity with SUV 4.3, raising possibility of right infrahilar adenopathy. -We reviewed results of PET scan dated 08/13/2019 which showed right lower lobe nodule measures 1.8 cm with SUV 5.59.  Mild FDG uptake in the right hilum with SUV 3.94 compared to 4.3 previously.  2.  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  H. pylori negative. -XRT 30 Gray in 15 fractions from 03/18/2016 through 04/18/2016. -EGD and biopsy on 06/13/2018 shows mild chronic gastritis with small lymphoid aggregates with no features of lymphoma. -Current PET scan on 08/13/2019 did not show any abnormal uptake associated with abdominal organs.  No lymphadenopathy. -Her labs today including LDH are normal.  No palpable adenopathy.  She does not have any B symptoms. -Colonoscopy on 11/29/2018 showed diverticulosis in the sigmoid colon.  2 subcentimeter polyps in the sigmoid colon. -She had EGD scheduled in March of this year but, but was rescheduled due to Covid.  3.  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  Margins are negative. -She does not require any adjuvant therapy.  I have recommended surveillance with CT scan of the chest in 3 months.

## 2019-10-04 NOTE — Progress Notes (Signed)
[place],  [id number]   CLINIC:  Medical Oncology/Hematology  PCP:  Manon Hilding, MD [place] [address] [phone]   REASON FOR VISIT:  Follow-up for lung nodule, lung cancer and marginal zone lymphoma.  CURRENT THERAPY: Active surveillance.  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 Gala Romney, abnormal gastric mucosa. Pathology with EXTRANODAL Marginal zone lymphoma. NEGATIVE for H. Pylori   03/03/2016 Miscellaneous   H pylori stool antigen NEGATIVE   03/03/2016 PET scan   Focal area of hypermetabolism and wall thickening involving the body antral junction region of the stomach c/w history of lymphoma. 5.5 mm LLL pulm nodule not hypermetabolic. Non contrast chest CT in 6 month   03/18/2016 - 04/08/2016 Radiation Therapy   The gastric tumor received 30 Gy in 15 fractions of 2 Gy   Lung cancer ([place])  05/19/2018 Initial Diagnosis   Lung cancer ([place])   06/06/2018 Cancer Staging   Staging form: Lung, AJCC 8th Edition - Clinical: cT1b, cN0 - Signed by Zoila Shutter, MD on 06/06/2018      CANCER STAGING: Cancer Staging Lung cancer [facility]) Staging form: Lung, AJCC 8th Edition - Clinical: cT1b, cN0 - Signed by Zoila Shutter, MD on 06/06/2018  MALToma [facility]) Staging form: Lymphoid Neoplasms, AJCC 6th Edition - Clinical stage from 03/23/2016: Stage I - Signed by Baird Cancer, PA-C on 03/23/2016    INTERVAL HISTORY:  Ms. Monica [name] 78 y.o. female seen for follow-up after right lung surgery.  She underwent right lower lobectomy on 09/10/2019.  Energy levels are slowly improving.  Shortness of breath on exertion is also slowly improving.  Reported some bilateral groin pains since yesterday.  Denies any change in bowel habits.  Denies any fevers or chills.  No burning on urination reported.  Appetite is 50%.  Energy levels are  25%.    REVIEW OF SYSTEMS:  Review of Systems  Constitutional: Positive for fatigue.  Respiratory: Positive for shortness of breath.   Gastrointestinal: Positive for nausea.  All other systems reviewed and are negative.    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 Lindalou Hose 2009: Normal per PCP notes  COLONOSCOPY N/A 11/29/2018   Procedure: COLONOSCOPY;  Surgeon: Daneil Dolin, MD;  [facility];  Service: Endoscopy;  Laterality: N/A;  7:30am   ESOPHAGOGASTRODUODENOSCOPY     RMR: Prominant Schzgzki ring/component of peptic stricture status post dilation and disruption as described above, otherwise norma esophagus, moderate-sized hiatal hernia, antal pyloric channel, and posterier bulbar erosions, otherwise unremarkable stomach, D1 and D2 . Inflammatory findings on the stomach and duodenum will likely be related to aspirin effect. We need to rule out Helicobacter pylor    ESOPHAGOGASTRODUODENOSCOPY N/A 03/10/2015   Dr. 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 Pathology with lymphoid population of stomach, slight atypia   ESOPHAGOGASTRODUODENOSCOPY N/A 02/03/2016   Dr. Gala Romney: Schatzki ring noted at GE junction, dilated with 57 and then 66 French Maloney dilator. Large hiatal hernia. 6 x 7 cm nodular geographically ulcerated mucosa, biopsy c/w MALToma   ESOPHAGOGASTRODUODENOSCOPY N/A 04/08/2016   Dr. Gala Romney: moderate Schatzki's ring/web s/p dilation, large hiatal hernia, localized area of gastric lymphoma visualized and appeared to be much improved. normal second portion of the duodenum. No specimens collected. Esophageal lumen notably tighted up significantly since her dilation in April of this year.    ESOPHAGOGASTRODUODENOSCOPY N/A 08/04/2016   Procedure: ESOPHAGOGASTRODUODENOSCOPY (EGD);  Surgeon: Daneil Dolin, MD;  [facility];  Service: Endoscopy;  Laterality: N/A;  7:30 am   ESOPHAGOGASTRODUODENOSCOPY N/A 09/01/2016   Procedure: ESOPHAGOGASTRODUODENOSCOPY (EGD);  Surgeon: Daneil Dolin, MD;  [facility];  Service: Endoscopy;  Laterality: N/A;  830    ESOPHAGOGASTRODUODENOSCOPY N/A 05/10/2017   Dr. Gala Romney: Moderate Schatzki ring at the GE junction, status post dilation with 73 [place].  Medium sized hiatal hernia.  Few localized erosions in the gastric antrum.  Stomach biopsy showed chronic gastritis, no H. pylori.  No atypical lymphoid infiltrates or other features of lymphoproliferative process   ESOPHAGOGASTRODUODENOSCOPY N/A 06/13/2018   Dr. Gala Romney: Schatzki ring status post dilation.  Large hiatal hernia.  Focal area 4 x 4 cm along the greater curvature, somewhat erythematous and thickened mucosa, biopsy benign.  Next EGD in August 2020.   EYE SURGERY     INTERCOSTAL NERVE BLOCK Right 09/10/2019   Procedure: Intercostal Nerve Block;  Surgeon: [name], MD;   Location: [facility];  Service: Thoracic;  Laterality: Right;   LOBECTOMY Left 05/19/2018   Procedure: LEFT LOWER LOBECTOMY;  Surgeon: [name], MD;  Location: [facility];  Service: Thoracic;  Laterality: Left;   LYMPH NODE DISSECTION Right 09/10/2019   Procedure: Lymph Node Dissection;  Surgeon: [name], MD;  Location: [facility];  Service: Thoracic;  Laterality: Right;   MALONEY DILATION N/A 03/10/2015   Procedure: Keturah Shavers;  Surgeon: Daneil Dolin, MD;  [facility];  Service: Endoscopy;  Laterality: N/A;   MALONEY DILATION N/A 02/03/2016   Procedure: Venia Minks DILATION;  Surgeon: Daneil Dolin, MD;  [facility];  Service: Endoscopy;  Laterality: N/A;   MALONEY DILATION N/A 04/08/2016   Procedure: Venia Minks DILATION;  Surgeon: Daneil Dolin, MD;  [facility];  Service: Endoscopy;  Laterality: N/A;   MALONEY DILATION N/A 05/10/2017   Procedure: Venia Minks DILATION;  Surgeon: Daneil Dolin, MD;  [facility];  Service: Endoscopy;  Laterality: N/A;   MALONEY DILATION N/A 06/13/2018   Procedure: Venia Minks DILATION;  Surgeon: Daneil Dolin, MD;  [facility];  Service: Endoscopy;  Laterality: N/A;  POLYPECTOMY  11/29/2018   Procedure: POLYPECTOMY;  Surgeon: Daneil Dolin, MD;  [facility];  Service: Endoscopy;;   VIDEO ASSISTED THORACOSCOPY (VATS)/ LOBECTOMY Right 09/10/2019   Procedure: VIDEO ASSISTED THORACOSCOPY (VATS)/RIGHT LOWER LOBECTOMY;  Surgeon: [name], MD;  Location: [facility];  Service: Thoracic;  Laterality: Right;   VIDEO ASSISTED THORACOSCOPY (VATS)/WEDGE RESECTION Left 05/19/2018   Procedure: VIDEO ASSISTED THORACOSCOPY (VATS)/WEDGE RESECTION;  Surgeon: [name], MD;  Location: [place];  Service: Thoracic;  Laterality: Left;     SOCIAL HISTORY:  Social History   Socioeconomic History   Marital status: Married    Spouse name: Not on file   Number of children: Not on  file   Years of education: Not on file   Highest education level: Not on file  Occupational History   Not on file  Social Needs   Financial resource strain: Not on file   Food insecurity    Worry: Not on file    Inability: Not on file   Transportation needs    Medical: Not on file    Non-medical: Not on file  Tobacco Use   Smoking status: Current Every Day Smoker    Packs/day: 0.50    Years: 57.00    Pack years: 28.50    Types: Cigarettes    Last attempt to quit: 05/23/2018    Years since quitting: 1.3   Smokeless tobacco: Never Used   Tobacco comment: trying to quit, wearing patch  Substance and Sexual Activity   Alcohol use: No    Alcohol/week: 0.0 standard drinks   Drug use: No   Sexual activity: Not on file  Lifestyle   Physical activity    Days per week: Not on file    Minutes per session: Not on file   Stress: Not on file  Relationships   Social connections    Talks on phone: Not on file    Gets together: Not on file    Attends religious service: Not on file    Active member of club or organization: Not on file    Attends meetings of clubs or organizations: Not on file    Relationship status: Not on file   Intimate partner violence    Fear of current or ex partner: Not on file    Emotionally abused: Not on file    Physically abused: Not on file    Forced sexual activity: Not on file  Other Topics Concern   Not on file  Social History Narrative   Not on file    FAMILY HISTORY:  Family History  Problem Relation Age of Onset   Heart attack Father    Dementia Father    Diabetes Sister    Diabetes Brother    Diabetes Sister    Colon cancer Neg Hx     CURRENT MEDICATIONS:  Outpatient Encounter Medications as of 10/04/2019  Medication Sig   acetaminophen (TYLENOL) 325 MG tablet Take 650 mg by mouth every 6 (six) hours as needed for moderate pain or headache.   apixaban (ELIQUIS) 5 MG TABS tablet Take 1 tablet (5 mg total) by  mouth 2 (two) times daily.   aspirin EC 81 MG tablet Take 81 mg by mouth at bedtime.   citalopram (CELEXA) 20 MG tablet Take 20 mg by mouth daily.    dextromethorphan-guaiFENesin (MUCINEX DM) 30-600 MG 12hr tablet Take 1 tablet by mouth 2 (two) times daily.   losartan (COZAAR) 50 MG tablet Take 1  tablet (50 mg total) by mouth daily.   metFORMIN (GLUCOPHAGE-XR) 500 MG 24 hr tablet Take 500 mg by mouth daily.    metoprolol tartrate (LOPRESSOR) 25 MG tablet Take 1 tablet (25 mg total) by mouth 2 (two) times daily.   Multiple Vitamin (MULTIVITAMIN WITH MINERALS) TABS tablet Take 1 tablet by mouth daily.   pioglitazone (ACTOS) 15 MG tablet Take 15 mg by mouth daily.   Polyvinyl Alcohol-Povidone PF (REFRESH) 1.4-0.6 % SOLN Place 1 drop into both eyes as needed (Dry eyes).    pravastatin (PRAVACHOL) 40 MG tablet Take 40 mg by mouth at bedtime.    promethazine (PHENERGAN) 25 MG tablet Take 25 mg by mouth every 4 (four) hours as needed.   [DISCONTINUED] ondansetron (ZOFRAN) 4 MG tablet Take 1 tablet (4 mg total) by mouth every 8 (eight) hours as needed for nausea or vomiting.   No facility-administered encounter medications on file as of 10/04/2019.     ALLERGIES:  Allergies  Allergen Reactions   Sulfur Hives     PHYSICAL EXAM:  ECOG Performance status: 1  Vitals:   10/04/19 1549  BP: 110/61  Pulse: 79  Resp: 18  Temp: (!) 96.8 F (36 C)  SpO2: 98%   Filed Weights   10/04/19 1549  Weight: 164 lb (74.4 kg)    Physical Exam Vitals signs reviewed.  Constitutional:      Appearance: Normal appearance.  Cardiovascular:     Rate and Rhythm: Normal rate and regular rhythm.     Heart sounds: Normal heart sounds.  Pulmonary:     Effort: Pulmonary effort is normal.     Breath sounds: Normal breath sounds.  Abdominal:     General: There is no distension.     Palpations: Abdomen is soft. There is no mass.  Musculoskeletal:        General: No swelling.  Lymphadenopathy:       Cervical: No cervical adenopathy.  Skin:    General: Skin is warm.  Neurological:     General: No focal deficit present.     Mental Status: She is alert and oriented to person, place, and time.  Psychiatric:        Mood and Affect: Mood normal.        Behavior: Behavior normal.      LABORATORY DATA:  I have reviewed the labs as listed.  CBC    Component Value Date/Time   WBC 5.2 09/15/2019 0240   RBC 3.22 (L) 09/15/2019 0240   HGB 9.4 (L) 09/15/2019 0240   HCT 28.6 (L) 09/15/2019 0240   PLT 257 09/15/2019 0240   MCV 88.8 09/15/2019 0240   MCH 29.2 09/15/2019 0240   MCHC 32.9 09/15/2019 0240   RDW 15.4 09/15/2019 0240   LYMPHSABS 2.1 08/13/2019 1255   MONOABS 0.6 08/13/2019 1255   EOSABS 0.1 08/13/2019 1255   BASOSABS 0.1 08/13/2019 1255   CMP Latest Ref Rng & Units 09/15/2019 09/12/2019 09/11/2019  Glucose 70 - 99 mg/dL 141(H) 187(H) 133(H)  BUN 8 - 23 mg/dL 19 18 15   Creatinine 0.44 - 1.00 mg/dL 0.75 0.87 0.87  Sodium 135 - 145 mmol/L 134(L) 135 135  Potassium 3.5 - 5.1 mmol/L 4.0 4.7 4.4  Chloride 98 - 111 mmol/L 100 100 100  CO2 22 - 32 mmol/L 23 25 24   Calcium 8.9 - 10.3 mg/dL 8.3(L) 8.5(L) 8.8(L)  Total Protein 6.5 - 8.1 g/dL - 6.2(L) -  Total Bilirubin 0.3 - 1.2 mg/dL - 0.8 -  Alkaline Phos 38 - 126 U/L - 53 -  AST 15 - 41 U/L - 48(H) -  ALT 0 - 44 U/L - 34 -       DIAGNOSTIC IMAGING:  I have independently reviewed the scans and discussed with the patient.   ASSESSMENT & PLAN:   Lung cancer ([place]) 1.  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 -PET scan from 04/30/2019 showed new hypermetabolic focus in the right lower lobe, 0.9 cm, SUV 2.6.  Right infrahilar accentuated metabolic activity with SUV 4.3, raising possibility of right infrahilar adenopathy. -We reviewed results of PET scan dated 08/13/2019 which showed right lower lobe nodule  measures 1.8 cm with SUV 5.59.  Mild FDG uptake in the right hilum with SUV 3.94 compared to 4.3 previously.  2.  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  H. pylori negative. -XRT 30 Gray in 15 fractions from 03/18/2016 through 04/18/2016. -EGD and biopsy on 06/13/2018 shows mild chronic gastritis with small lymphoid aggregates with no features of lymphoma. -Current PET scan on 08/13/2019 did not show any abnormal uptake associated with abdominal organs.  No lymphadenopathy. -Her labs today including LDH are normal.  No palpable adenopathy.  She does not have any B symptoms. -Colonoscopy on 11/29/2018 showed diverticulosis in the sigmoid colon.  2 subcentimeter polyps in the sigmoid colon. -She had EGD scheduled in March of this year but, but was rescheduled due to Covid.  3.  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  Margins are negative. -She does not require any adjuvant therapy.  I have recommended surveillance with CT scan of the chest in 3 months.   Total time spent is 25 minutes with more than 50% time spent face-to-face discussing pathology results, surveillance plan, counseling and coordination of care.  Orders placed this encounter:  Orders Placed This Encounter  Procedures   CT Chest W Contrast   CBC with Differential/Platelet   Comprehensive metabolic panel      Derek Jack, MD [place] [phone]

## 2019-10-16 DIAGNOSIS — F411 Generalized anxiety disorder: Secondary | ICD-10-CM | POA: Diagnosis not present

## 2019-10-16 DIAGNOSIS — R110 Nausea: Secondary | ICD-10-CM | POA: Diagnosis not present

## 2019-10-16 DIAGNOSIS — C7800 Secondary malignant neoplasm of unspecified lung: Secondary | ICD-10-CM | POA: Diagnosis not present

## 2019-10-16 DIAGNOSIS — C801 Malignant (primary) neoplasm, unspecified: Secondary | ICD-10-CM | POA: Diagnosis not present

## 2019-10-16 DIAGNOSIS — I4891 Unspecified atrial fibrillation: Secondary | ICD-10-CM | POA: Diagnosis not present

## 2019-10-16 DIAGNOSIS — Z720 Tobacco use: Secondary | ICD-10-CM | POA: Diagnosis not present

## 2019-10-16 DIAGNOSIS — I10 Essential (primary) hypertension: Secondary | ICD-10-CM | POA: Diagnosis not present

## 2019-10-17 NOTE — Progress Notes (Signed)
CARDIOLOGY OFFICE NOTE  Date:  10/24/2019    Monica [name] Date of Birth: 1940/11/25 Medical Record #[id number]  PCP:  Manon Hilding, MD  Cardiologist:  [facility]   Chief Complaint  Patient presents with  . Follow-up    Seen for Dr. Marlou Porch    History of Present Illness: Monica [name] is a 78 y.o. female who presents today for a follow up visit. Seen for Dr. Marlou Porch.   She has a history of left lower lobe resection on 05/19/2018 for a stage Ia squamous cell carcinoma who had atrial fibrillation postoperatively and was started on amiodarone at that time. She was not tolerant of the amiodarone due to nausea - this was stopped.   She has a history of prior tobacco use, gastric MALT lymphoma, type 2 diabetes, RBBB, Schatzki's ring, hiatal hernia, interstitial cystitis and depression.  Last seen in September by Dr. Marlou Porch. Was felt to be doing well. She was in NSR and no documentation of return of AF.   The patient and husband do not have symptoms concerning for COVID-19 infection (fever, chills, cough, or new shortness of breath).   Comes in today. Here with her husband. Admitted last month with Dr. 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 Prior history of stage 1s squamous cell with LLL resection in 2019. No chemo or XRT needed currently needed per her report. She had recurrent AF - not able to take amiodarone - was placed on Eliquis - should be long term due to elevated CHADSVASC. She is not smoking. Says she has no intent to start back. Says she is improving daily. No palpitations. She is doing more activity and feeling like she is getting stronger. Appetite has come back. No swelling.  Losartan was increased for elevated BP.   Past Medical History:  Diagnosis Date  . Cancer (HCC)    NHL, Malt Lymphoma  . Depression   . DM (diabetes mellitus) ([place])    TYPE  2  . Dysphagia   . Dysrhythmia    History of palpatations  . GERD (gastroesophageal reflux  disease)    INGESTION   . Heart palpitations   . Hiatal hernia   . Hyperlipidemia   . Interstitial cystitis   . Lung cancer, lower lobe ([place]) 07/21/2018   Left lower lobe, stage Ia squamous cell carcinoma  . 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 Sinusitis     Past Surgical History:  Procedure Laterality Date  . ABDOMINAL HYSTERECTOMY    . BIOPSY  09/01/2016   Procedure: BIOPSY;  Surgeon: Daneil Dolin, MD;  [facility];  Service: Endoscopy;;  gastric  . BIOPSY  11/29/2018   Procedure: BIOPSY;  Surgeon: Daneil Dolin, MD;  [facility];  Service: Endoscopy;;  right colon  . BLADDER SURGERY    . CATARACT EXTRACTION W/ INTRAOCULAR LENS  IMPLANT, BILATERAL  2015  . CATARACT EXTRACTION, BILATERAL    . COLONOSCOPY     Dr. Lindalou Hose 2009: Normal per PCP notes  . COLONOSCOPY N/A 11/29/2018   Procedure: COLONOSCOPY;  Surgeon: Daneil Dolin, MD;  [facility];  Service: Endoscopy;  Laterality: N/A;  7:30am  . ESOPHAGOGASTRODUODENOSCOPY     RMR: Prominant Schzgzki ring/component of peptic stricture status post dilation and disruption as described above, otherwise norma esophagus, moderate-sized hiatal hernia, antal pyloric channel, and posterier bulbar erosions, otherwise unremarkable stomach, D1 and D2 .  Inflammatory findings on the stomach and duodenum will likely be related to aspirin effect. We need to rule out Helicobacter pylor  . ESOPHAGOGASTRODUODENOSCOPY N/A 03/10/2015   Dr. 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 Pathology with lymphoid population of stomach, slight atypia  . ESOPHAGOGASTRODUODENOSCOPY N/A 02/03/2016   Dr. Gala Romney: Schatzki ring noted at GE junction, dilated with 82 and then 5 [place] dilator. Large hiatal hernia. 6 x 7 cm nodular geographically ulcerated mucosa, biopsy c/w MALToma  . ESOPHAGOGASTRODUODENOSCOPY N/A 04/08/2016   Dr. Gala Romney:  moderate Schatzki's ring/web s/p dilation, large hiatal hernia, localized area of gastric lymphoma visualized and appeared to be much improved. normal second portion of the duodenum. No specimens collected. Esophageal lumen notably tighted up significantly since her dilation in April of this year.   . ESOPHAGOGASTRODUODENOSCOPY N/A 08/04/2016   Procedure: ESOPHAGOGASTRODUODENOSCOPY (EGD);  Surgeon: Daneil Dolin, MD;  [facility];  Service: Endoscopy;  Laterality: N/A;  7:30 am  . ESOPHAGOGASTRODUODENOSCOPY N/A 09/01/2016   Procedure: ESOPHAGOGASTRODUODENOSCOPY (EGD);  Surgeon: Daneil Dolin, MD;  [facility];  Service: Endoscopy;  Laterality: N/A;  830   . ESOPHAGOGASTRODUODENOSCOPY N/A 05/10/2017   Dr. Gala Romney: Moderate Schatzki ring at the GE junction, status post dilation with 24 [place].  Medium sized hiatal hernia.  Few localized erosions in the gastric antrum.  Stomach biopsy showed chronic gastritis, no H. pylori.  No atypical lymphoid infiltrates or other features of lymphoproliferative process  . ESOPHAGOGASTRODUODENOSCOPY N/A 06/13/2018   Dr. Gala Romney: Schatzki ring status post dilation.  Large hiatal hernia.  Focal area 4 x 4 cm along the greater curvature, somewhat erythematous and thickened mucosa, biopsy benign.  Next EGD in August 2020.  Marland Kitchen EYE SURGERY    . INTERCOSTAL NERVE BLOCK Right 09/10/2019   Procedure: Intercostal Nerve Block;  Surgeon: [name], MD;  Location: [place];  Service: Thoracic;  Laterality: Right;  . LOBECTOMY Left 05/19/2018   Procedure: LEFT LOWER LOBECTOMY;  Surgeon: [name], MD;  Location: [place];  Service: Thoracic;  Laterality: Left;  . LYMPH NODE DISSECTION Right 09/10/2019   Procedure: Lymph Node Dissection;  Surgeon: [name], MD;  Location: [place];  Service: Thoracic;  Laterality: Right;  . MALONEY DILATION N/A 03/10/2015   Procedure: Venia Minks DILATION;  Surgeon: Daneil Dolin, MD;  [facility];   Service: Endoscopy;  Laterality: N/A;  . Venia Minks DILATION N/A 02/03/2016   Procedure: Venia Minks DILATION;  Surgeon: Daneil Dolin, MD;  [facility];  Service: Endoscopy;  Laterality: N/A;  . Venia Minks DILATION N/A 04/08/2016   Procedure: Venia Minks DILATION;  Surgeon: Daneil Dolin, MD;  [facility];  Service: Endoscopy;  Laterality: N/A;  . Venia Minks DILATION N/A 05/10/2017   Procedure: Venia Minks DILATION;  Surgeon: Daneil Dolin, MD;  [facility];  Service: Endoscopy;  Laterality: N/A;  . Venia Minks DILATION N/A 06/13/2018   Procedure: Venia Minks DILATION;  Surgeon: Daneil Dolin, MD;  [facility];  Service: Endoscopy;  Laterality: N/A;  . POLYPECTOMY  11/29/2018   Procedure: POLYPECTOMY;  Surgeon: Daneil Dolin, MD;  [facility];  Service: Endoscopy;;  . VIDEO ASSISTED THORACOSCOPY (VATS)/ LOBECTOMY Right 09/10/2019   Procedure: VIDEO ASSISTED THORACOSCOPY (VATS)/RIGHT LOWER LOBECTOMY;  Surgeon: [name], MD;  Location: [place];  Service: Thoracic;  Laterality: Right;  Marland Kitchen VIDEO ASSISTED THORACOSCOPY (VATS)/WEDGE RESECTION Left 05/19/2018  Procedure: VIDEO ASSISTED THORACOSCOPY (VATS)/WEDGE RESECTION;  Surgeon: [name], MD;  [facility];  Service: Thoracic;  Laterality: Left;     Medications: Current Meds  Medication Sig  . acetaminophen (TYLENOL) 325 MG tablet Take 650 mg by mouth every 6 (six) hours as needed for moderate pain or headache.  Marland Kitchen apixaban (ELIQUIS) 5 MG TABS tablet Take 1 tablet (5 mg total) by mouth 2 (two) times daily.  . citalopram (CELEXA) 20 MG tablet Take 20 mg by mouth daily.   Marland Kitchen losartan (COZAAR) 50 MG tablet Take 1 tablet (50 mg total) by mouth daily.  . metFORMIN (GLUCOPHAGE-XR) 500 MG 24 hr tablet Take 500 mg by mouth daily.   . metoprolol tartrate (LOPRESSOR) 25 MG tablet Take 1 tablet (25 mg total) by mouth 2 (two) times daily.  . Multiple Vitamin (MULTIVITAMIN WITH MINERALS) TABS tablet  Take 1 tablet by mouth daily.  . pioglitazone (ACTOS) 15 MG tablet Take 15 mg by mouth daily.  . Polyvinyl Alcohol-Povidone PF (REFRESH) 1.4-0.6 % SOLN Place 1 drop into both eyes as needed (Dry eyes).   . pravastatin (PRAVACHOL) 40 MG tablet Take 40 mg by mouth at bedtime.   . [DISCONTINUED] apixaban (ELIQUIS) 5 MG TABS tablet Take 1 tablet (5 mg total) by mouth 2 (two) times daily.  . [DISCONTINUED] losartan (COZAAR) 50 MG tablet Take 1 tablet (50 mg total) by mouth daily.     Allergies: Allergies  Allergen Reactions  . Sulfur Hives    Social History: The patient  reports that she has been smoking cigarettes. She has a 28.50 pack-year smoking history. She has never used smokeless tobacco. She reports that she does not drink alcohol or use drugs.   Family History: The patient's family history includes Dementia in her father; Diabetes in her brother, sister, and sister; Heart attack in her father.   Review of Systems: Please see the history of present illness.   All other systems are reviewed and negative.   Physical Exam: VS:  BP 138/82   Pulse 69   Ht 5\' 6"  (1.676 m)   Wt 172 lb 12.8 oz (78.4 kg)   SpO2 94%   BMI 27.89 kg/m  .  BMI Body mass index is 27.89 kg/m.  Wt Readings from Last 3 Encounters:  10/24/19 172 lb 12.8 oz (78.4 kg)  10/04/19 164 lb (74.4 kg)  10/02/19 165 lb (74.8 kg)    General: Pleasant. Elderly. Alert and in no acute distress. Color little pale.  HEENT: Normal.  Neck: Supple, no JVD, carotid bruits, or masses noted.  Cardiac: Regular rate and rhythm. Rare ectopic. No murmurs, rubs, or gallops. No edema.  Respiratory:  Lungs are fairly clear with decreased breath sounds.  GI: Soft and nontender.  MS: No deformity or atrophy. Gait and ROM intact.  Skin: Warm and dry. Color is normal.  Neuro:  Strength and sensation are intact and no gross focal deficits noted.  Psych: Alert, appropriate and with normal affect.   LABORATORY DATA:  EKG:  EKG is  ordered today. This demonstrates NSR with RBBB.  Lab Results  Component Value Date   WBC 5.2 09/15/2019   HGB 9.4 (L) 09/15/2019   HCT 28.6 (L) 09/15/2019   PLT 257 09/15/2019   GLUCOSE 141 (H) 09/15/2019   ALT 34 09/12/2019   AST 48 (H) 09/12/2019   NA 134 (L) 09/15/2019   K 4.0 09/15/2019   CL 100 09/15/2019   CREATININE 0.75 09/15/2019  BUN 19 09/15/2019   CO2 23 09/15/2019   INR 1.0 09/06/2019   HGBA1C 8.0 (H) 09/06/2019     BNP (last 3 results) No results for input(s): BNP in the last 8760 hours.  ProBNP (last 3 results) No results for input(s): PROBNP in the last 8760 hours.   Other Studies Reviewed Today:  ECHO 2019: - Left ventricle: The cavity size was normal. Wall thickness was increased in a pattern of moderate LVH. Systolic function was vigorous. The estimated ejection fraction was in the range of 65% to 70%. Wall motion was normal; there were no regional wall motion abnormalities. Features are consistent with a pseudonormal left ventricular filling pattern, with concomitant abnormal relaxation and increased filling pressure (grade 2 diastolic dysfunction). - Aortic valve: Mildly calcified annulus. Trileaflet. - Mitral valve: There was mild regurgitation. - Left atrium: The atrium was moderately dilated. - Right atrium: Central venous pressure (est): 3 mm Hg. - Atrial septum: No defect or patent foramen ovale was identified. - Tricuspid valve: There was trivial regurgitation. - Pulmonary arteries: Systolic pressure could not be accurately estimated. - Pericardium, extracardiac: There was no pericardial effusion.   ASSESSMENT & PLAN:    1. PAF - recurrent again following lung surgery - now back in sinus by EKG today and exam - no need for cardioversion. Would favor long term anticoagulation due to [place] of 76 (age, gender, HTN, DM and noted atherosclerosis).   2. DM - per PCP  3. HLD - on statin.   4. Tobacco abuse - has  stopped - hopefully this is long term.   5. Aortic atherosclerosis - needs aggressive CV risk factor modification.   6. RBBB - chronic.   7. Prior LLL lobectomy - for squamous cell carcinoma and now with RLL for non small cell carcinoma. Plan per Dr. Roxan Hockey.   8. HTN - BP is good here - continue ARB therapy.   9. High risk medicine - lab here today.   10. COVID-19 Education: The signs and symptoms of COVID-19 were discussed with the patient and how to seek care for testing (follow up with PCP or arrange E-visit).  The importance of social distancing, staying at home, hand hygiene and wearing a mask when out in public were discussed today.  Current medicines are reviewed with the patient today.  The patient does not have concerns regarding medicines other than what has been noted above.  The following changes have been made:  See above.  Labs/ tests ordered today include:    Orders Placed This Encounter  Procedures  . Basic metabolic panel  . CBC no Diff  . EKG 12-Lead     Disposition:   FU with [place] in 2 to 3 months with repeat EKG - lab today. They wish to transfer to the [place] office due to location. Husband sees Dr. Bronson Ing.    Patient is agreeable to this plan and will call if any problems develop in the interim.   SignedTruitt Merle, NP  10/24/2019 9:42 AM  [facility] [address] [place], [place]  [address] Phone: [phone] Fax: [phone]

## 2019-10-24 ENCOUNTER — Ambulatory Visit (INDEPENDENT_AMBULATORY_CARE_PROVIDER_SITE_OTHER): Payer: Medicare Other | Admitting: Nurse Practitioner

## 2019-10-24 ENCOUNTER — Encounter: Payer: Self-pay | Admitting: Nurse Practitioner

## 2019-10-24 ENCOUNTER — Other Ambulatory Visit: Payer: Self-pay

## 2019-10-24 VITALS — BP 138/82 | HR 69 | Ht 66.0 in | Wt 172.8 lb

## 2019-10-24 DIAGNOSIS — Z79899 Other long term (current) drug therapy: Secondary | ICD-10-CM

## 2019-10-24 DIAGNOSIS — I480 Paroxysmal atrial fibrillation: Secondary | ICD-10-CM

## 2019-10-24 DIAGNOSIS — Z7901 Long term (current) use of anticoagulants: Secondary | ICD-10-CM | POA: Diagnosis not present

## 2019-10-24 MED ORDER — LOSARTAN POTASSIUM 50 MG PO TABS: 50.0000 mg | ORAL_TABLET | Freq: Every day | ORAL | 3 refills | Status: DC

## 2019-10-24 MED ORDER — APIXABAN 5 MG PO TABS: 5.0000 mg | ORAL_TABLET | Freq: Two times a day (BID) | ORAL | 3 refills | Status: DC

## 2019-10-24 NOTE — Patient Instructions (Addendum)
After Visit Summary:  We will be checking the following labs today - BMET & CBC   Medication Instructions:    Continue with your current medicines.   I refilled the Eliquis today and the Losartan to [facility]    If you need a refill on your cardiac medications before your next appointment, please call your pharmacy.     Testing/Procedures To Be Arranged:  N/A  Follow-Up:   We will arrange follow up with Dr. Bronson Ing in [place] with EKG on return in 2 to 3 months    At [facility], you and your health needs are our priority.  As part of our continuing mission to provide you with exceptional heart care, we have created designated Provider Care Teams.  These Care Teams include your primary Cardiologist (physician) and Advanced Practice Providers (APPs -  Physician Assistants and Nurse Practitioners) who all work together to provide you with the care you need, when you need it.  Special Instructions:  . Stay safe, stay home, wash your hands for at least 20 seconds and wear a mask when out in public.  . It was good to talk with you today.    Call the [place] office at [phone] if you have any questions, problems or concerns.

## 2019-10-25 LAB — BASIC METABOLIC PANEL
BUN/Creatinine Ratio: 17 (ref 12–28)
BUN: 13 mg/dL (ref 8–27)
CO2: 22 mmol/L (ref 20–29)
Calcium: 9.4 mg/dL (ref 8.7–10.3)
Chloride: 96 mmol/L (ref 96–106)
Creatinine, Ser: 0.77 mg/dL (ref 0.57–1.00)
GFR calc Af Amer: 86 mL/min/{1.73_m2} (ref >59)
GFR calc non Af Amer: 75 mL/min/{1.73_m2} (ref >59)
Glucose: 160 mg/dL — ABNORMAL HIGH (ref 65–99)
Potassium: 4.8 mmol/L (ref 3.5–5.2)
Sodium: 135 mmol/L (ref 134–144)

## 2019-10-25 LAB — CBC
Hematocrit: 38.3 % (ref 34.0–46.6)
Hemoglobin: 12.1 g/dL (ref 11.1–15.9)
MCH: 28.1 pg (ref 26.6–33.0)
MCHC: 31.6 g/dL (ref 31.5–35.7)
MCV: 89 fL (ref 79–97)
Platelets: 312 10*3/uL (ref 150–450)
RBC: 4.31 x10E6/uL (ref 3.77–5.28)
RDW: 14.2 % (ref 11.7–15.4)
WBC: 6.2 10*3/uL (ref 3.4–10.8)

## 2019-11-20 DIAGNOSIS — N3020 Other chronic cystitis without hematuria: Secondary | ICD-10-CM | POA: Diagnosis not present

## 2019-11-22 DIAGNOSIS — M25472 Effusion, left ankle: Secondary | ICD-10-CM | POA: Diagnosis not present

## 2019-11-22 DIAGNOSIS — E782 Mixed hyperlipidemia: Secondary | ICD-10-CM | POA: Diagnosis not present

## 2019-11-22 DIAGNOSIS — C884 Extranodal marginal zone B-cell lymphoma of mucosa-associated lymphoid tissue [MALT-lymphoma]: Secondary | ICD-10-CM | POA: Diagnosis not present

## 2019-11-22 DIAGNOSIS — I10 Essential (primary) hypertension: Secondary | ICD-10-CM | POA: Diagnosis not present

## 2019-11-22 DIAGNOSIS — C3490 Malignant neoplasm of unspecified part of unspecified bronchus or lung: Secondary | ICD-10-CM | POA: Diagnosis not present

## 2019-11-22 DIAGNOSIS — Z6827 Body mass index (BMI) 27.0-27.9, adult: Secondary | ICD-10-CM | POA: Diagnosis not present

## 2019-11-22 DIAGNOSIS — E1165 Type 2 diabetes mellitus with hyperglycemia: Secondary | ICD-10-CM | POA: Diagnosis not present

## 2019-11-22 DIAGNOSIS — F411 Generalized anxiety disorder: Secondary | ICD-10-CM | POA: Diagnosis not present

## 2019-11-22 DIAGNOSIS — R05 Cough: Secondary | ICD-10-CM | POA: Diagnosis not present

## 2019-11-22 DIAGNOSIS — R233 Spontaneous ecchymoses: Secondary | ICD-10-CM | POA: Diagnosis not present

## 2019-11-30 DIAGNOSIS — I10 Essential (primary) hypertension: Secondary | ICD-10-CM | POA: Diagnosis not present

## 2019-11-30 DIAGNOSIS — E1165 Type 2 diabetes mellitus with hyperglycemia: Secondary | ICD-10-CM | POA: Diagnosis not present

## 2019-12-03 ENCOUNTER — Other Ambulatory Visit: Payer: Self-pay | Admitting: Thoracic Surgery (Cardiothoracic Vascular Surgery)

## 2019-12-03 DIAGNOSIS — C3432 Malignant neoplasm of lower lobe, left bronchus or lung: Secondary | ICD-10-CM

## 2019-12-04 ENCOUNTER — Ambulatory Visit
Admission: RE | Admit: 2019-12-04 | Discharge: 2019-12-04 | Disposition: A | Discharge status: Home / Self Care | Payer: Medicare Other | Source: Ambulatory Visit | Attending: Thoracic Surgery (Cardiothoracic Vascular Surgery) | Admitting: Thoracic Surgery (Cardiothoracic Vascular Surgery)

## 2019-12-04 ENCOUNTER — Other Ambulatory Visit: Payer: Self-pay

## 2019-12-04 ENCOUNTER — Ambulatory Visit (INDEPENDENT_AMBULATORY_CARE_PROVIDER_SITE_OTHER): Payer: Self-pay | Admitting: Thoracic Surgery (Cardiothoracic Vascular Surgery)

## 2019-12-04 VITALS — BP 144/66 | HR 75 | Temp 97.3°F | Resp 24 | Ht 66.0 in | Wt 170.0 lb

## 2019-12-04 DIAGNOSIS — Z85118 Personal history of other malignant neoplasm of bronchus and lung: Secondary | ICD-10-CM | POA: Diagnosis not present

## 2019-12-04 DIAGNOSIS — C3432 Malignant neoplasm of lower lobe, left bronchus or lung: Secondary | ICD-10-CM

## 2019-12-04 DIAGNOSIS — C3431 Malignant neoplasm of lower lobe, right bronchus or lung: Secondary | ICD-10-CM

## 2019-12-04 DIAGNOSIS — N3020 Other chronic cystitis without hematuria: Secondary | ICD-10-CM | POA: Diagnosis not present

## 2019-12-04 DIAGNOSIS — R300 Dysuria: Secondary | ICD-10-CM | POA: Diagnosis not present

## 2019-12-04 NOTE — Progress Notes (Signed)
[address]       [place] [address]             [phone]    HPI: Monica [name] returns for a scheduled follow-up  Monica [name] is a 79 year old woman with a history of tobacco abuse, COPD, left lower lobectomy for a stage Ia squamous cell carcinoma in 2019, gastric MALT lymphoma, type 2 diabetes, hyperlipidemia, reflux, Schatzki's ring, hiatal hernia, and depression.  She continues to smoke after her first operation and then she developed a second nodule.  She had a thoracoscopic right lower lobectomy on 09/11/2019.  The nodule was a squamous cell carcinoma.  This was a stage Ib (T2, N0) tumor.  Postoperatively she had some atrial fibrillation and was started on amiodarone.  Amiodarone was stopped due to nausea.  She did go home on Eliquis.  She saw Dr. Delton Coombes.  No adjuvant therapy is needed at this time. She saw Truitt Merle.  She is going to remain on Eliquis for now.  She has not smoked since her surgery.  She is not having much in the way of incisional pain.  She is having a cough which sometimes is spasmodic.  She occasionally brings up some clear mucus.  She does get winded with activity.  She has not been walking for exercise.  Past Medical History:  Diagnosis Date  . Cancer (HCC)    NHL, Malt Lymphoma  . Depression   . DM (diabetes mellitus) ([place])    TYPE  2  . Dysphagia   . Dysrhythmia    History of palpatations  . GERD (gastroesophageal reflux disease)    INGESTION   . Heart palpitations   . Hiatal hernia   . Hyperlipidemia   . Interstitial cystitis   . Lung cancer, lower lobe ([place]) 07/21/2018   Left lower lobe, stage Ia squamous cell carcinoma  . 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 Sinusitis     Current Outpatient Medications  Medication Sig Dispense Refill  . acetaminophen (TYLENOL) 325 MG tablet Take 650 mg by mouth every 6 (six) hours as needed for moderate pain or headache.    Marland Kitchen apixaban (ELIQUIS) 5 MG  TABS tablet Take 1 tablet (5 mg total) by mouth 2 (two) times daily. 180 tablet 3  . citalopram (CELEXA) 20 MG tablet Take 20 mg by mouth daily.     Marland Kitchen doxycycline (VIBRAMYCIN) 100 MG capsule Take 100 mg by mouth daily. x 30 days (starting 12/04/19) for UTI    . losartan (COZAAR) 50 MG tablet Take 1 tablet (50 mg total) by mouth daily. 90 tablet 3  . metFORMIN (GLUCOPHAGE-XR) 500 MG 24 hr tablet Take 500 mg by mouth daily.     . metoprolol tartrate (LOPRESSOR) 25 MG tablet Take 1 tablet (25 mg total) by mouth 2 (two) times daily. 60 tablet 3  . Multiple Vitamin (MULTIVITAMIN WITH MINERALS) TABS tablet Take 1 tablet by mouth daily.    . pioglitazone (ACTOS) 15 MG tablet Take 15 mg by mouth daily.    . Polyvinyl Alcohol-Povidone PF (REFRESH) 1.4-0.6 % SOLN Place 1 drop into both eyes as needed (Dry eyes).     . pravastatin (PRAVACHOL) 40 MG tablet Take 40 mg by mouth at bedtime.      No current facility-administered medications for this visit.    Physical Exam BP (!) 144/66 (BP Location: Right Arm, Patient Position: Sitting, Cuff Size: Normal)   Pulse  75   Temp (!) 97.3 F (36.3 C) (Skin)   Resp (!) 24   Ht 5\' 6"  (1.676 m)   Wt 170 lb (77.1 kg)   SpO2 92% Comment: RA  BMI 27.41 kg/m  79 year old woman in no acute distress Alert and oriented x3 with no focal deficits Lungs diminished at bases but otherwise clear, no wheezing Incisions well-healed Cardiac regular rate and rhythm  Diagnostic Tests: CHEST - 2 VIEW  COMPARISON:  10/02/2019  FINDINGS: There is mild left basilar scarring. There is no focal consolidation. There is no pleural effusion or pneumothorax. The heart and mediastinal contours are unremarkable.  There is a large hiatal hernia.  There is no acute osseous abnormality.  IMPRESSION: No active cardiopulmonary disease.   Electronically Signed   By: Monica [name]   On: 12/04/2019 09:48 I personally reviewed the chest x-ray images and concur with the  findings noted above  Impression: Monica [name] is a 79 year old woman who has a history of tobacco abuse and COPD.  She had a left lower lobectomy for stage Ia squamous cell carcinoma in July 2019 and then underwent a thoracoscopic right lower lobectomy for stage Ib squamous cell carcinoma in November 2020.  She has recovered well from the surgery.  She does complain of getting short of breath easily.  I explained to her that she has had the equivalent of a full lung removed at this point and will always have some pulmonary limitations.  Exercise and smoking cessation are the only ways to improve her respiratory status.  I offered to refer her to pulmonary rehab but she does not want to do that due to the Covid situation.  Tobacco abuse-she has not smoked since her surgery.  That is now 3 months.  I congratulated her on that achievement and encouraged her to continue with abstinence.   Plan: Follow-up with Dr. Delton Coombes I will be happy to see her back anytime in the future if I can be of any further assistance with her care  [name], MD [facility] [phone]

## 2019-12-06 ENCOUNTER — Other Ambulatory Visit: Payer: Self-pay | Admitting: Physician Assistant

## 2019-12-06 ENCOUNTER — Other Ambulatory Visit: Payer: Self-pay | Admitting: Cardiovascular Disease

## 2019-12-06 DIAGNOSIS — Z23 Encounter for immunization: Secondary | ICD-10-CM | POA: Diagnosis not present

## 2019-12-11 DIAGNOSIS — N3020 Other chronic cystitis without hematuria: Secondary | ICD-10-CM | POA: Diagnosis not present

## 2019-12-13 ENCOUNTER — Telehealth: Payer: Self-pay | Admitting: Cardiovascular Disease

## 2019-12-13 DIAGNOSIS — E11319 Type 2 diabetes mellitus with unspecified diabetic retinopathy without macular edema: Secondary | ICD-10-CM | POA: Diagnosis not present

## 2019-12-13 NOTE — Telephone Encounter (Signed)

## 2019-12-14 ENCOUNTER — Telehealth: Payer: Self-pay | Admitting: Cardiovascular Disease

## 2019-12-14 ENCOUNTER — Other Ambulatory Visit: Payer: Self-pay

## 2019-12-14 ENCOUNTER — Telehealth: Payer: Self-pay | Admitting: *Deleted

## 2019-12-14 ENCOUNTER — Encounter: Payer: Self-pay | Admitting: Cardiovascular Disease

## 2019-12-14 ENCOUNTER — Ambulatory Visit (INDEPENDENT_AMBULATORY_CARE_PROVIDER_SITE_OTHER): Payer: Medicare Other | Admitting: Cardiovascular Disease

## 2019-12-14 VITALS — BP 150/78 | HR 74 | Ht 66.0 in | Wt 178.0 lb

## 2019-12-14 DIAGNOSIS — Z6827 Body mass index (BMI) 27.0-27.9, adult: Secondary | ICD-10-CM | POA: Diagnosis not present

## 2019-12-14 DIAGNOSIS — I10 Essential (primary) hypertension: Secondary | ICD-10-CM

## 2019-12-14 DIAGNOSIS — L659 Nonscarring hair loss, unspecified: Secondary | ICD-10-CM | POA: Diagnosis not present

## 2019-12-14 DIAGNOSIS — I480 Paroxysmal atrial fibrillation: Secondary | ICD-10-CM | POA: Diagnosis not present

## 2019-12-14 DIAGNOSIS — Z7901 Long term (current) use of anticoagulants: Secondary | ICD-10-CM

## 2019-12-14 DIAGNOSIS — E7800 Pure hypercholesterolemia, unspecified: Secondary | ICD-10-CM | POA: Diagnosis not present

## 2019-12-14 DIAGNOSIS — E559 Vitamin D deficiency, unspecified: Secondary | ICD-10-CM | POA: Diagnosis not present

## 2019-12-14 DIAGNOSIS — E782 Mixed hyperlipidemia: Secondary | ICD-10-CM | POA: Diagnosis not present

## 2019-12-14 DIAGNOSIS — I4891 Unspecified atrial fibrillation: Secondary | ICD-10-CM | POA: Diagnosis not present

## 2019-12-14 DIAGNOSIS — C801 Malignant (primary) neoplasm, unspecified: Secondary | ICD-10-CM | POA: Diagnosis not present

## 2019-12-14 DIAGNOSIS — E1165 Type 2 diabetes mellitus with hyperglycemia: Secondary | ICD-10-CM | POA: Diagnosis not present

## 2019-12-14 NOTE — Telephone Encounter (Signed)
The patient verbally consented for a telehealth phone visit with [facility] and understands that his/her insurance company will be billed for the encounter.  Will have vitals & medications.

## 2019-12-14 NOTE — Progress Notes (Signed)
SUBJECTIVE: The patient presents to establish care with me in our [place] office.  She was hospitalized in November 2020 after having undergone right lower lobe resection for non-small cell lung cancer which was complicated by postoperative atrial fibrillation, similar to her prior operation 2019.  She previously did not tolerate amiodarone.   She has a history of prior tobacco use, gastric MALT lymphoma, type 2 diabetes, RBBB, Schatzki's ring, hiatal hernia, interstitial cystitis and depression.  I personally reviewed the ECG today which demonstrates sinus rhythm with incomplete right bundle branch block and right axis deviation.  She is doing well and denies chest pain, leg swelling, palpitations.  Primary complaints relate to hair loss.    Review of Systems: As per "subjective", otherwise negative.  Allergies  Allergen Reactions  . Sulfur Hives    Current Outpatient Medications  Medication Sig Dispense Refill  . acetaminophen (TYLENOL) 325 MG tablet Take 650 mg by mouth every 6 (six) hours as needed for moderate pain or headache.    Marland Kitchen apixaban (ELIQUIS) 5 MG TABS tablet Take 1 tablet (5 mg total) by mouth 2 (two) times daily. 180 tablet 3  . citalopram (CELEXA) 20 MG tablet Take 20 mg by mouth daily.     Marland Kitchen losartan (COZAAR) 50 MG tablet Take 1 tablet (50 mg total) by mouth daily. 90 tablet 3  . metFORMIN (GLUCOPHAGE-XR) 500 MG 24 hr tablet Take 500 mg by mouth daily.     . metoprolol tartrate (LOPRESSOR) 25 MG tablet TAKE 1 TABLET BY MOUTH TWICE A DAY 180 tablet 0  . Multiple Vitamin (MULTIVITAMIN WITH MINERALS) TABS tablet Take 1 tablet by mouth daily.    . pioglitazone (ACTOS) 15 MG tablet Take 15 mg by mouth daily.    . Polyvinyl Alcohol-Povidone PF (REFRESH) 1.4-0.6 % SOLN Place 1 drop into both eyes as needed (Dry eyes).     . pravastatin (PRAVACHOL) 40 MG tablet Take 40 mg by mouth at bedtime.      No current facility-administered medications for this visit.    Past  Medical History:  Diagnosis Date  . Cancer (HCC)    NHL, Malt Lymphoma  . Depression   . DM (diabetes mellitus) ([place])    TYPE  2  . Dysphagia   . Dysrhythmia    History of palpatations  . GERD (gastroesophageal reflux disease)    INGESTION   . Heart palpitations   . Hiatal hernia   . Hyperlipidemia   . Interstitial cystitis   . Lung cancer, lower lobe ([place]) 07/21/2018   Left lower lobe, stage Ia squamous cell carcinoma  . 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 Sinusitis     Past Surgical History:  Procedure Laterality Date  . ABDOMINAL HYSTERECTOMY    . BIOPSY  09/01/2016   Procedure: BIOPSY;  Surgeon: Daneil Dolin, MD;  [facility];  Service: Endoscopy;;  gastric  . BIOPSY  11/29/2018   Procedure: BIOPSY;  Surgeon: Daneil Dolin, MD;  [facility];  Service: Endoscopy;;  right colon  . BLADDER SURGERY    . CATARACT EXTRACTION W/ INTRAOCULAR LENS  IMPLANT, BILATERAL  2015  . CATARACT EXTRACTION, BILATERAL    . COLONOSCOPY     Dr. Lindalou Hose 2009: Normal per PCP notes  . COLONOSCOPY N/A 11/29/2018   Procedure: COLONOSCOPY;  Surgeon: Daneil Dolin, MD;  [facility];  Service: Endoscopy;  Laterality: N/A;  7:30am  .  ESOPHAGOGASTRODUODENOSCOPY     RMR: Prominant Schzgzki ring/component of peptic stricture status post dilation and disruption as described above, otherwise norma esophagus, moderate-sized hiatal hernia, antal pyloric channel, and posterier bulbar erosions, otherwise unremarkable stomach, D1 and D2 . Inflammatory findings on the stomach and duodenum will likely be related to aspirin effect. We need to rule out Helicobacter pylor  . ESOPHAGOGASTRODUODENOSCOPY N/A 03/10/2015   Dr. 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 Pathology with lymphoid population of stomach, slight atypia  . ESOPHAGOGASTRODUODENOSCOPY N/A 02/03/2016   Dr. Gala Romney: Schatzki  ring noted at GE junction, dilated with 50 and then 29 [place] dilator. Large hiatal hernia. 6 x 7 cm nodular geographically ulcerated mucosa, biopsy c/w MALToma  . ESOPHAGOGASTRODUODENOSCOPY N/A 04/08/2016   Dr. Gala Romney: moderate Schatzki's ring/web s/p dilation, large hiatal hernia, localized area of gastric lymphoma visualized and appeared to be much improved. normal second portion of the duodenum. No specimens collected. Esophageal lumen notably tighted up significantly since her dilation in April of this year.   . ESOPHAGOGASTRODUODENOSCOPY N/A 08/04/2016   Procedure: ESOPHAGOGASTRODUODENOSCOPY (EGD);  Surgeon: Daneil Dolin, MD;  [facility];  Service: Endoscopy;  Laterality: N/A;  7:30 am  . ESOPHAGOGASTRODUODENOSCOPY N/A 09/01/2016   Procedure: ESOPHAGOGASTRODUODENOSCOPY (EGD);  Surgeon: Daneil Dolin, MD;  [facility];  Service: Endoscopy;  Laterality: N/A;  830   . ESOPHAGOGASTRODUODENOSCOPY N/A 05/10/2017   Dr. Gala Romney: Moderate Schatzki ring at the GE junction, status post dilation with 35 [place].  Medium sized hiatal hernia.  Few localized erosions in the gastric antrum.  Stomach biopsy showed chronic gastritis, no H. pylori.  No atypical lymphoid infiltrates or other features of lymphoproliferative process  . ESOPHAGOGASTRODUODENOSCOPY N/A 06/13/2018   Dr. Gala Romney: Schatzki ring status post dilation.  Large hiatal hernia.  Focal area 4 x 4 cm along the greater curvature, somewhat erythematous and thickened mucosa, biopsy benign.  Next EGD in August 2020.  Marland Kitchen EYE SURGERY    . INTERCOSTAL NERVE BLOCK Right 09/10/2019   Procedure: Intercostal Nerve Block;  Surgeon: [name], MD;  Location: [facility];  Service: Thoracic;  Laterality: Right;  . LOBECTOMY Left 05/19/2018   Procedure: LEFT LOWER LOBECTOMY;  Surgeon: [name], MD;  Location: [facility];  Service: Thoracic;  Laterality: Left;  . LYMPH NODE DISSECTION Right 09/10/2019   Procedure: Lymph Node  Dissection;  Surgeon: [name], MD;  Location: [facility];  Service: Thoracic;  Laterality: Right;  . MALONEY DILATION N/A 03/10/2015   Procedure: Venia Minks DILATION;  Surgeon: Daneil Dolin, MD;  [facility];  Service: Endoscopy;  Laterality: N/A;  . Venia Minks DILATION N/A 02/03/2016   Procedure: Venia Minks DILATION;  Surgeon: Daneil Dolin, MD;  [facility];  Service: Endoscopy;  Laterality: N/A;  . Venia Minks DILATION N/A 04/08/2016   Procedure: Venia Minks DILATION;  Surgeon: Daneil Dolin, MD;  [facility];  Service: Endoscopy;  Laterality: N/A;  . Venia Minks DILATION N/A 05/10/2017   Procedure: Venia Minks DILATION;  Surgeon: Daneil Dolin, MD;  [facility];  Service: Endoscopy;  Laterality: N/A;  . Venia Minks DILATION N/A 06/13/2018   Procedure: Venia Minks DILATION;  Surgeon: Daneil Dolin, MD;  [facility];  Service: Endoscopy;  Laterality: N/A;  . POLYPECTOMY  11/29/2018   Procedure: POLYPECTOMY;  Surgeon: Daneil Dolin, MD;  [facility];  Service: Endoscopy;;  . VIDEO ASSISTED THORACOSCOPY (  VATS)/ LOBECTOMY Right 09/10/2019   Procedure: VIDEO ASSISTED THORACOSCOPY (VATS)/RIGHT LOWER LOBECTOMY;  Surgeon: [name], MD;  Location: [facility];  Service: Thoracic;  Laterality: Right;  Marland Kitchen VIDEO ASSISTED THORACOSCOPY (VATS)/WEDGE RESECTION Left 05/19/2018   Procedure: VIDEO ASSISTED THORACOSCOPY (VATS)/WEDGE RESECTION;  Surgeon: [name], MD;  [facility];  Service: Thoracic;  Laterality: Left;    Social History   Socioeconomic History  . Marital status: Married    Spouse name: Not on file  . Number of children: Not on file  . Years of education: Not on file  . Highest education level: Not on file  Occupational History  . Not on file  Tobacco Use  . Smoking status: Former Smoker    Packs/day: 0.50    Years: 57.00    Pack years: 28.50    Types: Cigarettes    Quit date: 09/24/2019    Years since quitting:  0.2  . Smokeless tobacco: Never Used  . Tobacco comment: trying to quit, wearing patch  Substance and Sexual Activity  . Alcohol use: No    Alcohol/week: 0.0 standard drinks  . Drug use: No  . Sexual activity: Not on file  Other Topics Concern  . Not on file  Social History Narrative  . Not on file   Social Determinants of Health   Financial Resource Strain:   . Difficulty of Paying Living Expenses: Not on file  Food Insecurity:   . Worried About Charity fundraiser in the Last Year: Not on file  . Ran Out of Food in the Last Year: Not on file  Transportation Needs:   . Lack of Transportation (Medical): Not on file  . Lack of Transportation (Non-Medical): Not on file  Physical Activity:   . Days of Exercise per Week: Not on file  . Minutes of Exercise per Session: Not on file  Stress:   . Feeling of Stress : Not on file  Social Connections:   . Frequency of Communication with Friends and Family: Not on file  . Frequency of Social Gatherings with Friends and Family: Not on file  . Attends Religious Services: Not on file  . Active Member of Clubs or Organizations: Not on file  . Attends Archivist Meetings: Not on file  . Marital Status: Not on file  Intimate Partner Violence:   . Fear of Current or Ex-Partner: Not on file  . Emotionally Abused: Not on file  . Physically Abused: Not on file  . Sexually Abused: Not on file    [facility] LPN was present throughout the entirety of the encounter.  Vitals:   12/14/19 0843  BP: (!) 150/78  Pulse: 74  SpO2: 97%  Weight: 178 lb (80.7 kg)  Height: 5\' 6"  (1.676 m)    Wt Readings from Last 3 Encounters:  12/14/19 178 lb (80.7 kg)  12/04/19 170 lb (77.1 kg)  10/24/19 172 lb 12.8 oz (78.4 kg)     PHYSICAL EXAM General: NAD HEENT: Normal. Neck: No JVD, no thyromegaly. Lungs: Diminished sounds throughout, no crackles or wheezes. CV: Regular rate and rhythm, normal S1/S2, no S3/S4, no murmur. No pretibial or  periankle edema.  No carotid bruit.   Abdomen: Soft, nontender, no distention.  Neurologic: Alert and oriented.  Psych: Normal affect. Skin: Normal. Musculoskeletal: No gross deformities.      Labs: Lab Results  Component Value Date/Time   K 4.8 10/24/2019 09:41 AM   BUN 13 10/24/2019 09:41 AM  CREATININE 0.77 10/24/2019 09:41 AM   ALT 34 09/12/2019 04:17 AM   HGB 12.1 10/24/2019 09:41 AM     Lipids: No results found for: LDLCALC, LDLDIRECT, CHOL, TRIG, HDL    ECHO 2019: - Left ventricle: The cavity size was normal. Wall thickness was increased in a pattern of moderate LVH. Systolic function was vigorous. The estimated ejection fraction was in the range of 65% to 70%. Wall motion was normal; there were no regional wall motion abnormalities. Features are consistent with a pseudonormal left ventricular filling pattern, with concomitant abnormal relaxation and increased filling pressure (grade 2 diastolic dysfunction). - Aortic valve: Mildly calcified annulus. Trileaflet. - Mitral valve: There was mild regurgitation. - Left atrium: The atrium was moderately dilated. - Right atrium: Central venous pressure (est): 3 mm Hg. - Atrial septum: No defect or patent foramen ovale was identified. - Tricuspid valve: There was trivial regurgitation. - Pulmonary arteries: Systolic pressure could not be accurately estimated. - Pericardium, extracardiac: There was no pericardial effusion.  ASSESSMENT AND PLAN:  1.  Paroxysmal atrial fibrillation: She is in sinus rhythm.  We will continue chronic anticoagulation with apixaban.  Continue metoprolol for heart rate control.  2.  Hyperlipidemia: On statin therapy.  3.  Hypertension: Blood pressure is elevated.  This will need further monitoring.  4.  Hair loss: Encouraged her to speak with her PCP to check TSH if it has not been checked already.  Hair loss is not a significantly reported side effect with apixaban and I  informed her of this.   Disposition: Follow up 6 months virtual visit  Time spent: 40 minutes, of which greater than 50% was spent reviewing symptoms, relevant blood tests and studies, and discussing management plan with the patient.    Kate Sable, M.D., F.A.C.C.

## 2019-12-14 NOTE — Telephone Encounter (Signed)
Virtual Visit Pre-Appointment Phone Call  "(Name), I am calling you today to discuss your upcoming appointment. We are currently trying to limit exposure to the virus that causes COVID-19 by seeing patients at home rather than in the office."  1. "What is the BEST phone number to call the day of the visit?" - include this in appointment notes  2. "Do you have or have access to (through a family member/friend) a smartphone with video capability that we can use for your visit?" a. If yes - list this number in appt notes as "cell" (if different from BEST phone #) and list the appointment type as a VIDEO visit in appointment notes b. If no - list the appointment type as a PHONE visit in appointment notes  Confirm consent - "In the setting of the current Covid19 crisis, you are scheduled for a (phone or video) visit with your provider on (date) at (time).  Just as we do with many in-office visits, in order for you to participate in this visit, we must obtain consent.  If you'd like, I can send this to your mychart (if signed up) or email for you to review.  Otherwise, I can obtain your verbal consent now.  All virtual visits are billed to your insurance company just like a normal visit would be.  By agreeing to a virtual visit, we'd like you to understand that the technology does not allow for your provider to perform an examination, and thus may limit your provider's ability to fully assess your condition. If your provider identifies any concerns that need to be evaluated in person, we will make arrangements to do so.  Finally, though the technology is pretty good, we cannot assure that it will always work on either your or our end, and in the setting of a video visit, we may have to convert it to a phone-only visit.  In either situation, we cannot ensure that we have a secure connection.  Are you willing to proceed?" STAFF: Did the patient verbally acknowledge consent to telehealth visit? Document  YES/NO here: yes 3. Advise patient to be prepared - "Two hours prior to your appointment, go ahead and check your blood pressure, pulse, oxygen saturation, and your weight (if you have the equipment to check those) and write them all down. When your visit starts, your provider will ask you for this information. If you have an Apple Watch or Kardia device, please plan to have heart rate information ready on the day of your appointment. Please have a pen and paper handy nearby the day of the visit as well."  4. Give patient instructions for MyChart download to smartphone OR Doximity/[email or web address] as below if video visit (depending on what platform provider is using)  5. Inform patient they will receive a phone call 15 minutes prior to their appointment time (may be from unknown caller ID) so they should be prepared to answer    TELEPHONE CALL NOTE  Monica [name] has been deemed a candidate for a follow-up tele-health visit to limit community exposure during the Covid-19 pandemic. I spoke with the patient via phone to ensure availability of phone/video source, confirm preferred email & phone number, and discuss instructions and expectations.  I reminded Monica [name] to be prepared with any vital sign and/or heart rhythm information that could potentially be obtained via home monitoring, at the time of her visit. I reminded Monica [name] to expect a phone call prior to her visit.  Monica [name] 12/14/2019 9:23 AM   INSTRUCTIONS FOR DOWNLOADING THE MYCHART APP TO SMARTPHONE  - The patient must first make sure to have activated MyChart and know their login information - If Apple, go to [employer] and type in MyChart in the search bar and download the app. If Android, ask patient to go to [employer] and type in [place] in the search bar and download the app. The app is free but as with any other app downloads, their phone may require them to verify saved payment information or Apple/Android  password.  - The patient will need to then log into the app with their MyChart username and password, and select [facility] as their healthcare provider to link the account. When it is time for your visit, go to the MyChart app, find appointments, and click Begin Video Visit. Be sure to Select Allow for your device to access the Microphone and Camera for your visit. You will then be connected, and your provider will be with you shortly.  **If they have any issues connecting, or need assistance please contact MyChart service desk [phone])**  **If using a computer, in order to ensure the best quality for their visit they will need to use either of the following Internet Browsers: [employer], or Google Chrome**  IF USING DOXIMITY or DOXY.ME - The patient will receive a link just prior to their visit by text.     FULL LENGTH CONSENT FOR TELE-HEALTH VISIT   I hereby voluntarily request, consent and authorize [facility] and its employed or contracted physicians, physician assistants, nurse practitioners or other licensed health care professionals (the Practitioner), to provide me with telemedicine health care services (the "Services") as deemed necessary by the treating Practitioner. I acknowledge and consent to receive the Services by the Practitioner via telemedicine. I understand that the telemedicine visit will involve communicating with the Practitioner through live audiovisual communication technology and the disclosure of certain medical information by electronic transmission. I acknowledge that I have been given the opportunity to request an in-person assessment or other available alternative prior to the telemedicine visit and am voluntarily participating in the telemedicine visit.  I understand that I have the right to withhold or withdraw my consent to the use of telemedicine in the course of my care at any time, without affecting my right to future care or treatment,  and that the Practitioner or I may terminate the telemedicine visit at any time. I understand that I have the right to inspect all information obtained and/or recorded in the course of the telemedicine visit and may receive copies of available information for a reasonable fee.  I understand that some of the potential risks of receiving the Services via telemedicine include:  Marland Kitchen Delay or interruption in medical evaluation due to technological equipment failure or disruption; . Information transmitted may not be sufficient (e.g. poor resolution of images) to allow for appropriate medical decision making by the Practitioner; and/or  . In rare instances, security protocols could fail, causing a breach of personal health information.  Furthermore, I acknowledge that it is my responsibility to provide information about my medical history, conditions and care that is complete and accurate to the best of my ability. I acknowledge that Practitioner's advice, recommendations, and/or decision may be based on factors not within their control, such as incomplete or inaccurate data provided by me or distortions of diagnostic images or specimens that may result from electronic transmissions. I understand that the  practice of medicine is not an Chief Strategy Officer and that Practitioner makes no warranties or guarantees regarding treatment outcomes. I acknowledge that I will receive a copy of this consent concurrently upon execution via email to the email address I last provided but may also request a printed copy by calling the office of [facility].    I understand that my insurance will be billed for this visit.   I have read or had this consent read to me. . I understand the contents of this consent, which adequately explains the benefits and risks of the Services being provided via telemedicine.  . I have been provided ample opportunity to ask questions regarding this consent and the Services and have had my questions  answered to my satisfaction. . I give my informed consent for the services to be provided through the use of telemedicine in my medical care  By participating in this telemedicine visit I agree to the above.

## 2019-12-14 NOTE — Patient Instructions (Signed)
Medication Instructions:  Continue all current medications.  Labwork: none  Testing/Procedures: none  Follow-Up: 6 months - phone   Any Other Special Instructions Will Be Listed Below (If Applicable).  If you need a refill on your cardiac medications before your next appointment, please call your pharmacy.

## 2019-12-18 DIAGNOSIS — R7989 Other specified abnormal findings of blood chemistry: Secondary | ICD-10-CM | POA: Diagnosis not present

## 2019-12-18 DIAGNOSIS — C3431 Malignant neoplasm of lower lobe, right bronchus or lung: Secondary | ICD-10-CM | POA: Diagnosis not present

## 2019-12-18 DIAGNOSIS — R911 Solitary pulmonary nodule: Secondary | ICD-10-CM | POA: Diagnosis not present

## 2019-12-28 DIAGNOSIS — I10 Essential (primary) hypertension: Secondary | ICD-10-CM | POA: Diagnosis not present

## 2019-12-28 DIAGNOSIS — E7849 Other hyperlipidemia: Secondary | ICD-10-CM | POA: Diagnosis not present

## 2020-01-03 ENCOUNTER — Inpatient Hospital Stay (HOSPITAL_COMMUNITY): Payer: Medicare Other | Attending: Hematology

## 2020-01-03 ENCOUNTER — Other Ambulatory Visit: Payer: Self-pay

## 2020-01-03 ENCOUNTER — Ambulatory Visit (HOSPITAL_COMMUNITY)
Admission: RE | Admit: 2020-01-03 | Discharge: 2020-01-03 | Disposition: A | Discharge status: Home / Self Care | Payer: Medicare Other | Source: Ambulatory Visit | Attending: Hematology | Admitting: Hematology

## 2020-01-03 DIAGNOSIS — Z7984 Long term (current) use of oral hypoglycemic drugs: Secondary | ICD-10-CM | POA: Diagnosis not present

## 2020-01-03 DIAGNOSIS — Z8572 Personal history of non-Hodgkin lymphomas: Secondary | ICD-10-CM | POA: Insufficient documentation

## 2020-01-03 DIAGNOSIS — Z90710 Acquired absence of both cervix and uterus: Secondary | ICD-10-CM | POA: Diagnosis not present

## 2020-01-03 DIAGNOSIS — Z87891 Personal history of nicotine dependence: Secondary | ICD-10-CM | POA: Diagnosis not present

## 2020-01-03 DIAGNOSIS — J439 Emphysema, unspecified: Secondary | ICD-10-CM | POA: Diagnosis not present

## 2020-01-03 DIAGNOSIS — Z85118 Personal history of other malignant neoplasm of bronchus and lung: Secondary | ICD-10-CM | POA: Diagnosis not present

## 2020-01-03 DIAGNOSIS — E119 Type 2 diabetes mellitus without complications: Secondary | ICD-10-CM | POA: Insufficient documentation

## 2020-01-03 DIAGNOSIS — Z7901 Long term (current) use of anticoagulants: Secondary | ICD-10-CM | POA: Diagnosis not present

## 2020-01-03 DIAGNOSIS — C3432 Malignant neoplasm of lower lobe, left bronchus or lung: Secondary | ICD-10-CM | POA: Insufficient documentation

## 2020-01-03 DIAGNOSIS — Z902 Acquired absence of lung [part of]: Secondary | ICD-10-CM | POA: Diagnosis not present

## 2020-01-03 DIAGNOSIS — C3431 Malignant neoplasm of lower lobe, right bronchus or lung: Secondary | ICD-10-CM | POA: Diagnosis not present

## 2020-01-03 LAB — CBC WITH DIFFERENTIAL/PLATELET
Abs Immature Granulocytes: 0.01 10*3/uL (ref 0.00–0.07)
Basophils Absolute: 0 10*3/uL (ref 0.0–0.1)
Basophils Relative: 1 %
Eosinophils Absolute: 0.1 10*3/uL (ref 0.0–0.5)
Eosinophils Relative: 1 %
HCT: 39.5 % (ref 36.0–46.0)
Hemoglobin: 12.4 g/dL (ref 12.0–15.0)
Immature Granulocytes: 0 %
Lymphocytes Relative: 30 %
Lymphs Abs: 1.8 10*3/uL (ref 0.7–4.0)
MCH: 28.6 pg (ref 26.0–34.0)
MCHC: 31.4 g/dL (ref 30.0–36.0)
MCV: 91 fL (ref 80.0–100.0)
Monocytes Absolute: 0.6 10*3/uL (ref 0.1–1.0)
Monocytes Relative: 9 %
Neutro Abs: 3.5 10*3/uL (ref 1.7–7.7)
Neutrophils Relative %: 59 %
Platelets: 271 10*3/uL (ref 150–400)
RBC: 4.34 MIL/uL (ref 3.87–5.11)
RDW: 16.1 % — ABNORMAL HIGH (ref 11.5–15.5)
WBC: 6 10*3/uL (ref 4.0–10.5)
nRBC: 0 % (ref 0.0–0.2)

## 2020-01-03 LAB — COMPREHENSIVE METABOLIC PANEL
ALT: 17 U/L (ref 0–44)
AST: 19 U/L (ref 15–41)
Albumin: 3.9 g/dL (ref 3.5–5.0)
Alkaline Phosphatase: 55 U/L (ref 38–126)
Anion gap: 8 (ref 5–15)
BUN: 18 mg/dL (ref 8–23)
CO2: 26 mmol/L (ref 22–32)
Calcium: 9 mg/dL (ref 8.9–10.3)
Chloride: 98 mmol/L (ref 98–111)
Creatinine, Ser: 1.06 mg/dL — ABNORMAL HIGH (ref 0.44–1.00)
GFR calc Af Amer: 59 mL/min — ABNORMAL LOW (ref >60)
GFR calc non Af Amer: 51 mL/min — ABNORMAL LOW (ref >60)
Glucose, Bld: 193 mg/dL — ABNORMAL HIGH (ref 70–99)
Potassium: 4.3 mmol/L (ref 3.5–5.1)
Sodium: 132 mmol/L — ABNORMAL LOW (ref 135–145)
Total Bilirubin: 0.3 mg/dL (ref 0.3–1.2)
Total Protein: 7.7 g/dL (ref 6.5–8.1)

## 2020-01-03 MED ORDER — IOHEXOL 300 MG/ML  SOLN
75.0000 mL | Freq: Once | INTRAMUSCULAR | Status: AC | PRN
  Administered 2020-01-03: 75 mL via INTRAVENOUS

## 2020-01-04 DIAGNOSIS — Z23 Encounter for immunization: Secondary | ICD-10-CM | POA: Diagnosis not present

## 2020-01-09 ENCOUNTER — Inpatient Hospital Stay (HOSPITAL_BASED_OUTPATIENT_CLINIC_OR_DEPARTMENT_OTHER): Payer: Medicare Other | Admitting: Hematology

## 2020-01-09 ENCOUNTER — Other Ambulatory Visit: Payer: Self-pay

## 2020-01-09 ENCOUNTER — Encounter (HOSPITAL_COMMUNITY): Payer: Self-pay | Admitting: Hematology

## 2020-01-09 VITALS — BP 142/60 | HR 60 | Temp 96.9°F | Resp 17 | Wt 178.6 lb

## 2020-01-09 DIAGNOSIS — C3432 Malignant neoplasm of lower lobe, left bronchus or lung: Secondary | ICD-10-CM | POA: Diagnosis not present

## 2020-01-09 DIAGNOSIS — J439 Emphysema, unspecified: Secondary | ICD-10-CM | POA: Diagnosis not present

## 2020-01-09 DIAGNOSIS — Z8572 Personal history of non-Hodgkin lymphomas: Secondary | ICD-10-CM | POA: Diagnosis not present

## 2020-01-09 DIAGNOSIS — Z902 Acquired absence of lung [part of]: Secondary | ICD-10-CM | POA: Diagnosis not present

## 2020-01-09 DIAGNOSIS — E119 Type 2 diabetes mellitus without complications: Secondary | ICD-10-CM | POA: Diagnosis not present

## 2020-01-09 DIAGNOSIS — C3431 Malignant neoplasm of lower lobe, right bronchus or lung: Secondary | ICD-10-CM | POA: Diagnosis not present

## 2020-01-09 DIAGNOSIS — Z85118 Personal history of other malignant neoplasm of bronchus and lung: Secondary | ICD-10-CM | POA: Diagnosis not present

## 2020-01-09 NOTE — Patient Instructions (Addendum)
[place] at [facility] Discharge Instructions  You were seen today by Dr. Delton Coombes. He went over your recent lab results. He will see you back in 3 months for labs, scan and follow up.   Thank you for choosing [place] at [facility] to provide your oncology and hematology care.  To afford each patient quality time with our provider, please arrive at least 15 minutes before your scheduled appointment time.   If you have a lab appointment with the [facility] please come in thru the  Main Entrance and check in at the main information desk  You need to re-schedule your appointment should you arrive 10 or more minutes late.  We strive to give you quality time with our providers, and arriving late affects you and other patients whose appointments are after yours.  Also, if you no show three or more times for appointments you may be dismissed from the clinic at the providers discretion.     Again, thank you for choosing [facility].  Our hope is that these requests will decrease the amount of time that you wait before being seen by our physicians.       _____________________________________________________________  Should you have questions after your visit to [facility], please contact our office at [phone] between the hours of 8:00 a.m. and 4:30 p.m.  Voicemails left after 4:00 p.m. will not be returned until the following business day.  For prescription refill requests, have your pharmacy contact our office and allow 72 hours.    Cancer Center Support Programs:   > Cancer Support Group  2nd Tuesday of the month 1pm-2pm, Journey Room

## 2020-01-09 NOTE — Assessment & Plan Note (Addendum)
1.  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 -I do not recommend any adjuvant therapy. -We reviewed results of the CT chest from 01/03/2020.  Left hilar lymph node measuring 1.3 cm in short axis, difficult to characterize on prior PET scan from 08/13/2019.  Right hilar lymph node 0.7 cm is stable.  Severe emphysema present. -Based on these findings, I have recommended a CT scan in 3 months to follow-up on the left hilar lymph node. -She complains of easy shortness of breath on exertion.  She was told to continue to walk slowly every day to improve her respiratory status. -She also reports tiredness.  Will check TSH, F68 and folic acid prior to next visit in 3 months.  We reviewed her labs from today which showed hemoglobin of 12.2.  2.  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 -Current scan does not show any evidence of recurrence.  3.  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  H. pylori negative. -XRT 30 Gray in 15 fractions from 03/18/2016 through 04/18/2016. -EGD biopsy on 06/13/2018 shows mild chronic gastritis and small lymphoid aggregates with no features of lymphoma. -PET scan on 08/13/2019 did not show any abnormal uptake associated with abdominal organs.  No lymphadenopathy. -Previous LDH was normal.  She does not have any B symptoms. -Colonoscopy on 11/29/2018 showed diverticulosis in the sigmoid colon with 2 subcentimeter polyps in sigmoid colon. -She will have EGD scheduled soon.

## 2020-01-09 NOTE — Progress Notes (Signed)
[address], [place] [address]   CLINIC:  Medical Oncology/Hematology  PCP:  Manon Hilding, MD [place] [address] [phone]   REASON FOR VISIT:  Follow-up for lung nodule, lung cancer and marginal zone lymphoma.  CURRENT THERAPY: Active surveillance.  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 Gala Romney, abnormal gastric mucosa. Pathology with EXTRANODAL Marginal zone lymphoma. NEGATIVE for H. Pylori   03/03/2016 Miscellaneous   H pylori stool antigen NEGATIVE   03/03/2016 PET scan   Focal area of hypermetabolism and wall thickening involving the body antral junction region of the stomach c/w history of lymphoma. 5.5 mm LLL pulm nodule not hypermetabolic. Non contrast chest CT in 6 month   03/18/2016 - 04/08/2016 Radiation Therapy   The gastric tumor received 30 Gy in 15 fractions of 2 Gy   Lung cancer ([place])  05/19/2018 Initial Diagnosis   Lung cancer ([place])   06/06/2018 Cancer Staging   Staging form: Lung, AJCC 8th Edition - Clinical: cT1b, cN0 - Signed by Zoila Shutter, MD on 06/06/2018      CANCER STAGING: Cancer Staging Lung cancer [facility]) Staging form: Lung, AJCC 8th Edition - Clinical: cT1b, cN0 - Signed by Zoila Shutter, MD on 06/06/2018  MALToma [facility]) Staging form: Lymphoid Neoplasms, AJCC 6th Edition - Clinical stage from 03/23/2016: Stage I - Signed by Baird Cancer, PA-C on 03/23/2016    INTERVAL HISTORY:  Ms. Gengler 79 y.o. 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  Patient reports tiredness.  Also reports easy shortness of breath on exertion.  Reported some cough with clear expectoration since surgery.  Appetite is 100%.  Energy levels are 25%.  No new onset pains reported.    REVIEW OF SYSTEMS:  Review of Systems  Constitutional: Positive for fatigue.  Respiratory: Positive for cough and  shortness of breath.   All other systems reviewed and are negative.    PAST MEDICAL/SURGICAL HISTORY:  Past Medical History:  Diagnosis Date  . Cancer (HCC)    NHL, Malt Lymphoma  . Depression   . 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 Dysphagia   . Dysrhythmia    History of palpatations  . GERD (gastroesophageal reflux disease)    INGESTION   . Heart palpitations   . Hiatal hernia   . Hyperlipidemia   . Interstitial cystitis   . Lung cancer, lower lobe ([place]) 07/21/2018   Left lower lobe, stage Ia squamous cell carcinoma  . 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 Sinusitis    Past Surgical History:  Procedure Laterality Date  . ABDOMINAL HYSTERECTOMY    . BIOPSY  09/01/2016   Procedure: BIOPSY;  Surgeon: Daneil Dolin, MD;  [facility];  Service: Endoscopy;;  gastric  . BIOPSY  11/29/2018   Procedure: BIOPSY;  Surgeon: Daneil Dolin, MD;  [facility];  Service: Endoscopy;;  right colon  . BLADDER SURGERY    . CATARACT EXTRACTION W/ INTRAOCULAR LENS  IMPLANT, BILATERAL  2015  . CATARACT EXTRACTION, BILATERAL    . COLONOSCOPY     Dr. Lindalou Hose 2009: Normal per PCP notes  . COLONOSCOPY N/A 11/29/2018   Procedure: COLONOSCOPY;  Surgeon: Daneil Dolin, MD;  [facility];  Service: Endoscopy;  Laterality: N/A;  7:30am  . ESOPHAGOGASTRODUODENOSCOPY     RMR: Prominant Schzgzki ring/component of peptic stricture status post dilation and disruption as described above, otherwise norma esophagus, moderate-sized hiatal hernia, antal pyloric channel, and posterier bulbar erosions, otherwise unremarkable stomach, D1 and D2 . Inflammatory findings on the stomach and duodenum will likely be related to aspirin effect. We need to rule out Helicobacter pylor  . ESOPHAGOGASTRODUODENOSCOPY N/A 03/10/2015   Dr. 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 Pathology with  lymphoid population of stomach, slight atypia  . ESOPHAGOGASTRODUODENOSCOPY N/A 02/03/2016   Dr. Gala Romney: Schatzki ring noted at GE junction, dilated with 46 and then 63 [place] dilator. Large hiatal hernia. 6 x 7 cm nodular geographically ulcerated mucosa, biopsy c/w MALToma  . ESOPHAGOGASTRODUODENOSCOPY N/A 04/08/2016   Dr. Gala Romney: moderate Schatzki's ring/web s/p dilation, large hiatal hernia, localized area of gastric lymphoma visualized and appeared to be much improved. normal second portion of the duodenum. No specimens collected. Esophageal lumen notably tighted up significantly since her dilation in April of this year.   . ESOPHAGOGASTRODUODENOSCOPY N/A 08/04/2016   Procedure: ESOPHAGOGASTRODUODENOSCOPY (EGD);  Surgeon: Daneil Dolin, MD;  [facility];  Service: Endoscopy;  Laterality: N/A;  7:30 am  . ESOPHAGOGASTRODUODENOSCOPY N/A 09/01/2016   Procedure: ESOPHAGOGASTRODUODENOSCOPY (EGD);  Surgeon: Daneil Dolin, MD;  [facility];  Service: Endoscopy;  Laterality: N/A;  830   . ESOPHAGOGASTRODUODENOSCOPY N/A 05/10/2017   Dr. Gala Romney: Moderate Schatzki ring at the GE junction, status post dilation with 80 [place].  Medium sized hiatal hernia.  Few localized erosions in the gastric antrum.  Stomach biopsy showed chronic gastritis, no H. pylori.  No atypical lymphoid infiltrates or other features of lymphoproliferative process  . ESOPHAGOGASTRODUODENOSCOPY N/A 06/13/2018   Dr. Gala Romney: Schatzki ring status post dilation.  Large hiatal hernia.  Focal area 4 x 4 cm along the greater curvature, somewhat erythematous and thickened mucosa, biopsy benign.  Next EGD in August 2020.  Marland Kitchen EYE SURGERY    . INTERCOSTAL NERVE BLOCK Right 09/10/2019   Procedure: Intercostal Nerve Block;  Surgeon: [name], MD;  Location: [facility];  Service: Thoracic;  Laterality: Right;  . LOBECTOMY Left 05/19/2018   Procedure: LEFT LOWER LOBECTOMY;  Surgeon: [name], MD;  Location: [facility];  Service: Thoracic;  Laterality: Left;  . LYMPH NODE DISSECTION Right 09/10/2019   Procedure: Lymph Node Dissection;  Surgeon: [name], MD;  Location: [place];  Service: Thoracic;  Laterality: Right;  . MALONEY DILATION N/A 03/10/2015   Procedure: Venia Minks DILATION;  Surgeon: Daneil Dolin, MD;  [facility];  Service: Endoscopy;  Laterality: N/A;  . Venia Minks DILATION N/A 02/03/2016   Procedure: Venia Minks DILATION;  Surgeon: Daneil Dolin, MD;  [facility];  Service: Endoscopy;  Laterality: N/A;  . Venia Minks DILATION N/A 04/08/2016   Procedure: Venia Minks DILATION;  Surgeon: Daneil Dolin, MD;  [facility];  Service: Endoscopy;  Laterality: N/A;  . Venia Minks DILATION N/A 05/10/2017   Procedure: Venia Minks DILATION;  Surgeon: Daneil Dolin, MD;  [facility];  Service: Endoscopy;  Laterality: N/A;  . Venia Minks DILATION N/A 06/13/2018   Procedure: Venia Minks DILATION;  Surgeon: Daneil Dolin, MD;  [facility];  Service: Endoscopy;  Laterality: N/A;  . POLYPECTOMY  11/29/2018   Procedure: POLYPECTOMY;  Surgeon: Daneil Dolin, MD;  [facility];  Service:  Endoscopy;;  . VIDEO ASSISTED THORACOSCOPY (VATS)/ LOBECTOMY Right 09/10/2019   Procedure: VIDEO ASSISTED THORACOSCOPY (VATS)/RIGHT LOWER LOBECTOMY;  Surgeon: [name], MD;  Location: [facility];  Service: Thoracic;  Laterality: Right;  Marland Kitchen VIDEO ASSISTED THORACOSCOPY (VATS)/WEDGE RESECTION Left 05/19/2018   Procedure: VIDEO ASSISTED THORACOSCOPY (VATS)/WEDGE RESECTION;  Surgeon: [name], MD;  Location: [facility];  Service: Thoracic;  Laterality: Left;     SOCIAL HISTORY:  Social History   Socioeconomic History  . Marital status: Married    Spouse name: Not on file  . Number of children: Not on file  . Years of education: Not on file  . Highest education level: Not on file  Occupational History  . Not on file  Tobacco Use  . Smoking status: Former Smoker     Packs/day: 0.50    Years: 57.00    Pack years: 28.50    Types: Cigarettes    Quit date: 09/24/2019    Years since quitting: 0.2  . Smokeless tobacco: Never Used  . Tobacco comment: trying to quit, wearing patch  Substance and Sexual Activity  . Alcohol use: No    Alcohol/week: 0.0 standard drinks  . Drug use: No  . Sexual activity: Not on file  Other Topics Concern  . Not on file  Social History Narrative  . Not on file   Social Determinants of Health   Financial Resource Strain:   . Difficulty of Paying Living Expenses: Not on file  Food Insecurity:   . Worried About Charity fundraiser in the Last Year: Not on file  . Ran Out of Food in the Last Year: Not on file  Transportation Needs:   . Lack of Transportation (Medical): Not on file  . Lack of Transportation (Non-Medical): Not on file  Physical Activity:   . Days of Exercise per Week: Not on file  . Minutes of Exercise per Session: Not on file  Stress:   . Feeling of Stress : Not on file  Social Connections:   . Frequency of Communication with Friends and Family: Not on file  . Frequency of Social Gatherings with Friends and Family: Not on file  . Attends Religious Services: Not on file  . Active Member of Clubs or Organizations: Not on file  . Attends Archivist Meetings: Not on file  . Marital Status: Not on file  Intimate Partner Violence:   . Fear of Current or Ex-Partner: Not on file  . Emotionally Abused: Not on file  . Physically Abused: Not on file  . Sexually Abused: Not on file    FAMILY HISTORY:  Family History  Problem Relation Age of Onset  . Heart attack Father   . Dementia Father   . Diabetes Sister   . Diabetes Brother   . Diabetes Sister   . Colon cancer Neg Hx     CURRENT MEDICATIONS:  Outpatient Encounter Medications as of 01/09/2020  Medication Sig  . apixaban (ELIQUIS) 5 MG TABS tablet Take 1 tablet (5 mg total) by mouth 2 (two) times daily.  . citalopram (CELEXA) 20 MG  tablet Take 20 mg by mouth daily.   Marland Kitchen losartan (COZAAR) 50 MG tablet Take 1 tablet (50 mg total) by mouth daily.  . metFORMIN (GLUCOPHAGE-XR) 500 MG 24 hr tablet Take 500 mg by mouth daily.   . metoprolol tartrate (LOPRESSOR) 25 MG tablet TAKE 1 TABLET BY MOUTH TWICE A DAY  . Multiple Vitamin (MULTIVITAMIN WITH MINERALS) TABS tablet Take  1 tablet by mouth daily.  . pioglitazone (ACTOS) 15 MG tablet Take 15 mg by mouth daily.  . pravastatin (PRAVACHOL) 40 MG tablet Take 40 mg by mouth at bedtime.   Marland Kitchen trimethoprim (TRIMPEX) 100 MG tablet Take 100 mg by mouth at bedtime.  Marland Kitchen acetaminophen (TYLENOL) 325 MG tablet Take 650 mg by mouth every 6 (six) hours as needed for moderate pain or headache.  . Polyvinyl Alcohol-Povidone PF (REFRESH) 1.4-0.6 % SOLN Place 1 drop into both eyes as needed (Dry eyes).   . triamcinolone lotion (KENALOG) 0.1 % SMARTSIG:1 Milliliter(s) Topical Twice Daily   No facility-administered encounter medications on file as of 01/09/2020.    ALLERGIES:  Allergies  Allergen Reactions  . Sulfur Hives     PHYSICAL EXAM:  ECOG Performance status: 1  Vitals:   01/09/20 1507  BP: (!) 142/60  Pulse: 60  Resp: 17  Temp: (!) 96.9 F (36.1 C)  SpO2: 97%   Filed Weights   01/09/20 1507  Weight: 178 lb 9.6 oz (81 kg)    Physical Exam Vitals reviewed.  Constitutional:      Appearance: Normal appearance.  Cardiovascular:     Rate and Rhythm: Normal rate and regular rhythm.     Heart sounds: Normal heart sounds.  Pulmonary:     Effort: Pulmonary effort is normal.     Breath sounds: Normal breath sounds.  Abdominal:     General: There is no distension.     Palpations: Abdomen is soft. There is no mass.  Musculoskeletal:        General: No swelling.  Lymphadenopathy:     Cervical: No cervical adenopathy.  Skin:    General: Skin is warm.  Neurological:     General: No focal deficit present.     Mental Status: She is alert and oriented to person, place, and time.   Psychiatric:        Mood and Affect: Mood normal.        Behavior: Behavior normal.      LABORATORY DATA:  I have reviewed the labs as listed.  CBC    Component Value Date/Time   WBC 6.0 01/03/2020 1157   RBC 4.34 01/03/2020 1157   HGB 12.4 01/03/2020 1157   HGB 12.1 10/24/2019 0941   HCT 39.5 01/03/2020 1157   HCT 38.3 10/24/2019 0941   PLT 271 01/03/2020 1157   PLT 312 10/24/2019 0941   MCV 91.0 01/03/2020 1157   MCV 89 10/24/2019 0941   MCH 28.6 01/03/2020 1157   MCHC 31.4 01/03/2020 1157   RDW 16.1 (H) 01/03/2020 1157   RDW 14.2 10/24/2019 0941   LYMPHSABS 1.8 01/03/2020 1157   MONOABS 0.6 01/03/2020 1157   EOSABS 0.1 01/03/2020 1157   BASOSABS 0.0 01/03/2020 1157   CMP Latest Ref Rng & Units 01/03/2020 10/24/2019 09/15/2019  Glucose 70 - 99 mg/dL 193(H) 160(H) 141(H)  BUN 8 - 23 mg/dL 18 13 19   Creatinine 0.44 - 1.00 mg/dL 1.06(H) 0.77 0.75  Sodium 135 - 145 mmol/L 132(L) 135 134(L)  Potassium 3.5 - 5.1 mmol/L 4.3 4.8 4.0  Chloride 98 - 111 mmol/L 98 96 100  CO2 22 - 32 mmol/L 26 22 23   Calcium 8.9 - 10.3 mg/dL 9.0 9.4 8.3(L)  Total Protein 6.5 - 8.1 g/dL 7.7 - -  Total Bilirubin 0.3 - 1.2 mg/dL 0.3 - -  Alkaline Phos 38 - 126 U/L 55 - -  AST 15 - 41 U/L 19 - -  ALT 0 - 44 U/L 17 - -       DIAGNOSTIC IMAGING:  I have independently reviewed her scan.   ASSESSMENT & PLAN:   Lung cancer ([place]) 1.  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 -I do not recommend any adjuvant therapy. -We reviewed results of the CT chest from 01/03/2020.  Left hilar lymph node measuring 1.3 cm in short axis, difficult to characterize on prior PET scan from 08/13/2019.  Right hilar lymph node 0.7 cm is stable.  Severe emphysema present. -Based on these  findings, I have recommended a CT scan in 3 months to follow-up on the left hilar lymph node. -She complains of easy shortness of breath on exertion.  She was told to continue to walk slowly every day to improve her respiratory status. -She also reports tiredness.  Will check TSH, Y33 and folic acid prior to next visit in 3 months.  We reviewed her labs from today which showed hemoglobin of 12.2.  2.  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 -Current scan does not show any evidence of recurrence.  3.  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  H. pylori negative. -XRT 30 Gray in 15 fractions from 03/18/2016 through 04/18/2016. -EGD biopsy on 06/13/2018 shows mild chronic gastritis and small lymphoid aggregates with no features of lymphoma. -PET scan on 08/13/2019 did not show any abnormal uptake associated with abdominal organs.  No lymphadenopathy. -Previous LDH was normal.  She does not have any B symptoms. -Colonoscopy on 11/29/2018 showed diverticulosis in the sigmoid colon with 2 subcentimeter polyps in sigmoid colon. -She will have EGD scheduled soon.    Orders placed this encounter:  Orders Placed This Encounter  Procedures  . CT Chest W Contrast  . CBC with Differential/Platelet  . Comprehensive metabolic panel  . Iron and TIBC  . Ferritin  . Vitamin B12  . Folate  . TSH      Derek Jack, [place] [phone]

## 2020-01-30 DIAGNOSIS — I10 Essential (primary) hypertension: Secondary | ICD-10-CM | POA: Diagnosis not present

## 2020-01-30 DIAGNOSIS — E7849 Other hyperlipidemia: Secondary | ICD-10-CM | POA: Diagnosis not present

## 2020-02-20 ENCOUNTER — Telehealth: Payer: Self-pay | Admitting: Cardiovascular Disease

## 2020-02-20 MED ORDER — APIXABAN 5 MG PO TABS: 5.0000 mg | ORAL_TABLET | Freq: Two times a day (BID) | ORAL | 0 refills | Status: DC

## 2020-02-20 NOTE — Telephone Encounter (Signed)
Medication sent to pharmacy  

## 2020-02-20 NOTE — Telephone Encounter (Signed)
Pt is needing 3 pills of apixaban (ELIQUIS) 5 MG TABS tablet [[id number]]  Till her Rx comes in the mail on Friday

## 2020-02-22 ENCOUNTER — Other Ambulatory Visit: Payer: Self-pay | Admitting: Cardiovascular Disease

## 2020-02-22 NOTE — Telephone Encounter (Signed)
Spoke with [facility] and pt and aware that rx would be filled today for 3 tablets as requested of Eliquis

## 2020-02-22 NOTE — Telephone Encounter (Signed)
Patient called stating that she has called and went to [place] location to pick up RX apixaban (ELIQUIS) 5 MG TABS tablet.  Patient is being told that they have not received any notification from our office to fill her prescription,

## 2020-03-05 DIAGNOSIS — Z85828 Personal history of other malignant neoplasm of skin: Secondary | ICD-10-CM | POA: Diagnosis not present

## 2020-03-05 DIAGNOSIS — D239 Other benign neoplasm of skin, unspecified: Secondary | ICD-10-CM | POA: Diagnosis not present

## 2020-03-05 DIAGNOSIS — L570 Actinic keratosis: Secondary | ICD-10-CM | POA: Diagnosis not present

## 2020-03-06 DIAGNOSIS — F17210 Nicotine dependence, cigarettes, uncomplicated: Secondary | ICD-10-CM | POA: Diagnosis not present

## 2020-03-06 DIAGNOSIS — Z720 Tobacco use: Secondary | ICD-10-CM | POA: Diagnosis not present

## 2020-03-06 DIAGNOSIS — E782 Mixed hyperlipidemia: Secondary | ICD-10-CM | POA: Diagnosis not present

## 2020-03-06 DIAGNOSIS — C801 Malignant (primary) neoplasm, unspecified: Secondary | ICD-10-CM | POA: Diagnosis not present

## 2020-03-06 DIAGNOSIS — E1165 Type 2 diabetes mellitus with hyperglycemia: Secondary | ICD-10-CM | POA: Diagnosis not present

## 2020-03-06 DIAGNOSIS — I10 Essential (primary) hypertension: Secondary | ICD-10-CM | POA: Diagnosis not present

## 2020-03-10 DIAGNOSIS — R8271 Bacteriuria: Secondary | ICD-10-CM | POA: Diagnosis not present

## 2020-03-10 DIAGNOSIS — N393 Stress incontinence (female) (male): Secondary | ICD-10-CM | POA: Diagnosis not present

## 2020-03-10 DIAGNOSIS — N3020 Other chronic cystitis without hematuria: Secondary | ICD-10-CM | POA: Diagnosis not present

## 2020-03-12 DIAGNOSIS — I10 Essential (primary) hypertension: Secondary | ICD-10-CM | POA: Diagnosis not present

## 2020-03-12 DIAGNOSIS — C3490 Malignant neoplasm of unspecified part of unspecified bronchus or lung: Secondary | ICD-10-CM | POA: Diagnosis not present

## 2020-03-12 DIAGNOSIS — E782 Mixed hyperlipidemia: Secondary | ICD-10-CM | POA: Diagnosis not present

## 2020-03-12 DIAGNOSIS — C884 Extranodal marginal zone B-cell lymphoma of mucosa-associated lymphoid tissue [MALT-lymphoma]: Secondary | ICD-10-CM | POA: Diagnosis not present

## 2020-03-12 DIAGNOSIS — F411 Generalized anxiety disorder: Secondary | ICD-10-CM | POA: Diagnosis not present

## 2020-03-12 DIAGNOSIS — R233 Spontaneous ecchymoses: Secondary | ICD-10-CM | POA: Diagnosis not present

## 2020-03-12 DIAGNOSIS — E1165 Type 2 diabetes mellitus with hyperglycemia: Secondary | ICD-10-CM | POA: Diagnosis not present

## 2020-03-12 DIAGNOSIS — M25472 Effusion, left ankle: Secondary | ICD-10-CM | POA: Diagnosis not present

## 2020-03-31 DIAGNOSIS — C884 Extranodal marginal zone B-cell lymphoma of mucosa-associated lymphoid tissue [MALT-lymphoma]: Secondary | ICD-10-CM | POA: Diagnosis not present

## 2020-03-31 DIAGNOSIS — Z87891 Personal history of nicotine dependence: Secondary | ICD-10-CM | POA: Diagnosis not present

## 2020-03-31 DIAGNOSIS — E7849 Other hyperlipidemia: Secondary | ICD-10-CM | POA: Diagnosis not present

## 2020-03-31 DIAGNOSIS — E1165 Type 2 diabetes mellitus with hyperglycemia: Secondary | ICD-10-CM | POA: Diagnosis not present

## 2020-03-31 DIAGNOSIS — I10 Essential (primary) hypertension: Secondary | ICD-10-CM | POA: Diagnosis not present

## 2020-04-10 ENCOUNTER — Other Ambulatory Visit (HOSPITAL_COMMUNITY): Payer: Medicare Other

## 2020-04-10 ENCOUNTER — Ambulatory Visit (HOSPITAL_COMMUNITY): Payer: Medicare Other

## 2020-04-16 ENCOUNTER — Ambulatory Visit (HOSPITAL_COMMUNITY): Payer: Medicare Other | Admitting: Hematology

## 2020-04-22 ENCOUNTER — Ambulatory Visit (HOSPITAL_COMMUNITY)
Admission: RE | Admit: 2020-04-22 | Discharge: 2020-04-22 | Disposition: A | Discharge status: Home / Self Care | Payer: Medicare Other | Source: Ambulatory Visit | Attending: Hematology | Admitting: Hematology

## 2020-04-22 ENCOUNTER — Other Ambulatory Visit: Payer: Self-pay

## 2020-04-22 ENCOUNTER — Inpatient Hospital Stay (HOSPITAL_COMMUNITY): Payer: Medicare Other | Attending: Hematology

## 2020-04-22 DIAGNOSIS — R109 Unspecified abdominal pain: Secondary | ICD-10-CM | POA: Diagnosis not present

## 2020-04-22 DIAGNOSIS — D125 Benign neoplasm of sigmoid colon: Secondary | ICD-10-CM | POA: Diagnosis not present

## 2020-04-22 DIAGNOSIS — R590 Localized enlarged lymph nodes: Secondary | ICD-10-CM | POA: Diagnosis not present

## 2020-04-22 DIAGNOSIS — Z8572 Personal history of non-Hodgkin lymphomas: Secondary | ICD-10-CM | POA: Diagnosis not present

## 2020-04-22 DIAGNOSIS — K5730 Diverticulosis of large intestine without perforation or abscess without bleeding: Secondary | ICD-10-CM | POA: Insufficient documentation

## 2020-04-22 DIAGNOSIS — K2950 Unspecified chronic gastritis without bleeding: Secondary | ICD-10-CM | POA: Diagnosis not present

## 2020-04-22 DIAGNOSIS — E041 Nontoxic single thyroid nodule: Secondary | ICD-10-CM | POA: Insufficient documentation

## 2020-04-22 DIAGNOSIS — C3432 Malignant neoplasm of lower lobe, left bronchus or lung: Secondary | ICD-10-CM | POA: Insufficient documentation

## 2020-04-22 DIAGNOSIS — E611 Iron deficiency: Secondary | ICD-10-CM | POA: Insufficient documentation

## 2020-04-22 DIAGNOSIS — Z79899 Other long term (current) drug therapy: Secondary | ICD-10-CM | POA: Diagnosis not present

## 2020-04-22 DIAGNOSIS — J90 Pleural effusion, not elsewhere classified: Secondary | ICD-10-CM | POA: Insufficient documentation

## 2020-04-22 DIAGNOSIS — J439 Emphysema, unspecified: Secondary | ICD-10-CM | POA: Diagnosis not present

## 2020-04-22 LAB — COMPREHENSIVE METABOLIC PANEL
ALT: 19 U/L (ref 0–44)
AST: 23 U/L (ref 15–41)
Albumin: 3.9 g/dL (ref 3.5–5.0)
Alkaline Phosphatase: 61 U/L (ref 38–126)
Anion gap: 9 (ref 5–15)
BUN: 11 mg/dL (ref 8–23)
CO2: 30 mmol/L (ref 22–32)
Calcium: 9.5 mg/dL (ref 8.9–10.3)
Chloride: 98 mmol/L (ref 98–111)
Creatinine, Ser: 0.84 mg/dL (ref 0.44–1.00)
GFR calc Af Amer: >60 mL/min (ref >60)
GFR calc non Af Amer: >60 mL/min (ref >60)
Glucose, Bld: 152 mg/dL — ABNORMAL HIGH (ref 70–99)
Potassium: 4.8 mmol/L (ref 3.5–5.1)
Sodium: 137 mmol/L (ref 135–145)
Total Bilirubin: 0.4 mg/dL (ref 0.3–1.2)
Total Protein: 7.7 g/dL (ref 6.5–8.1)

## 2020-04-22 LAB — CBC WITH DIFFERENTIAL/PLATELET
Abs Immature Granulocytes: 0.01 10*3/uL (ref 0.00–0.07)
Basophils Absolute: 0 10*3/uL (ref 0.0–0.1)
Basophils Relative: 1 %
Eosinophils Absolute: 0.1 10*3/uL (ref 0.0–0.5)
Eosinophils Relative: 2 %
HCT: 41.1 % (ref 36.0–46.0)
Hemoglobin: 13.2 g/dL (ref 12.0–15.0)
Immature Granulocytes: 0 %
Lymphocytes Relative: 39 %
Lymphs Abs: 2.3 10*3/uL (ref 0.7–4.0)
MCH: 30.2 pg (ref 26.0–34.0)
MCHC: 32.1 g/dL (ref 30.0–36.0)
MCV: 94.1 fL (ref 80.0–100.0)
Monocytes Absolute: 0.5 10*3/uL (ref 0.1–1.0)
Monocytes Relative: 9 %
Neutro Abs: 2.9 10*3/uL (ref 1.7–7.7)
Neutrophils Relative %: 49 %
Platelets: 273 10*3/uL (ref 150–400)
RBC: 4.37 MIL/uL (ref 3.87–5.11)
RDW: 14.1 % (ref 11.5–15.5)
WBC: 5.8 10*3/uL (ref 4.0–10.5)
nRBC: 0 % (ref 0.0–0.2)

## 2020-04-22 LAB — FERRITIN: Ferritin: 17 ng/mL (ref 11–307)

## 2020-04-22 LAB — TSH: TSH: 2.661 u[IU]/mL (ref 0.350–4.500)

## 2020-04-22 LAB — IRON AND TIBC
Iron: 99 ug/dL (ref 28–170)
Saturation Ratios: 25 % (ref 10.4–31.8)
TIBC: 402 ug/dL (ref 250–450)
UIBC: 303 ug/dL

## 2020-04-22 LAB — VITAMIN B12: Vitamin B-12: 2955 pg/mL — ABNORMAL HIGH (ref 180–914)

## 2020-04-22 LAB — FOLATE: Folate: 38.3 ng/mL (ref >5.9)

## 2020-04-22 MED ORDER — IOHEXOL 300 MG/ML  SOLN
75.0000 mL | Freq: Once | INTRAMUSCULAR | Status: AC | PRN
  Administered 2020-04-22: 75 mL via INTRAVENOUS

## 2020-04-28 ENCOUNTER — Encounter (HOSPITAL_COMMUNITY): Payer: Self-pay | Admitting: Oncology

## 2020-04-28 ENCOUNTER — Inpatient Hospital Stay (HOSPITAL_BASED_OUTPATIENT_CLINIC_OR_DEPARTMENT_OTHER): Payer: Medicare Other | Admitting: Oncology

## 2020-04-28 VITALS — BP 104/62 | HR 80 | Temp 96.0°F | Resp 18

## 2020-04-28 DIAGNOSIS — E611 Iron deficiency: Secondary | ICD-10-CM

## 2020-04-28 DIAGNOSIS — C884 Extranodal marginal zone b-cell lymphoma of mucosa-associated lymphoid tissue (malt-lymphoma) not having achieved remission: Secondary | ICD-10-CM

## 2020-04-28 DIAGNOSIS — K5792 Diverticulitis of intestine, part unspecified, without perforation or abscess without bleeding: Secondary | ICD-10-CM | POA: Diagnosis not present

## 2020-04-28 DIAGNOSIS — C3432 Malignant neoplasm of lower lobe, left bronchus or lung: Secondary | ICD-10-CM

## 2020-04-28 HISTORY — DX: Diverticulitis of intestine, part unspecified, without perforation or abscess without bleeding: K57.92

## 2020-04-28 HISTORY — DX: Iron deficiency: E61.1

## 2020-04-28 NOTE — Progress Notes (Signed)
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 -I do not recommend any adjuvant therapy. -We reviewed results of the CT chest from 01/03/2020.  Left hilar lymph node measuring 1.3 cm in short axis, difficult to characterize on prior PET scan from 08/13/2019.  Right hilar lymph node 0.7 cm is stable.  Severe emphysema present. -Based on these findings, I have recommended a CT scan in 3 months to follow-up on the left hilar lymph node. -She complains of easy shortness of breath on exertion.  She was told to continue to walk slowly every day to improve her respiratory status. 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 -Current scan does not show any evidence of recurrence. 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  H. pylori negative. -XRT 30 Gray in 15 fractions from 03/18/2016 through 04/18/2016. -EGD biopsy on 06/13/2018 shows mild chronic gastritis and small lymphoid aggregates with no features of lymphoma. -PET scan on 08/13/2019 did not show any abnormal uptake associated with abdominal organs.  No lymphadenopathy. -Previous LDH was normal.  She does not have any B symptoms. -Colonoscopy on  11/29/2018 showed diverticulosis in the sigmoid colon with 2 subcentimeter polyps in sigmoid colon. -She will have EGD scheduled soon. AND Iron deficiency- on pediatric vitamin- Flintstone vitamin with Fe.  CURRENT STATUS: Monica [name] 79 y.o. female returns for followup of in follow-up of squamous cell carcinoma of the lung (2 separate occurrences), gastric marginal zone lymphoma in remission, and what appears to be new iron deficiency without anemia.  She continues to have a decrease in stamina and fatigue.  Her other vitamin tests performed last week were normal.  She was recently diagnosed with diverticulitis by her primary care provider and is on Cipro and Flagyl therapy.  She continues to have low abdominal discomfort and I have encouraged her to contact her primary care provider or her GI specialist for further evaluation management.  She is asking for pain medication today and since this does not appear to be oncology related, I would not participate in pain medication prescribing at this juncture.  If proven otherwise, I would be happy to take over pain medication management if necessary.  She is afebrile but currently uncomfortable complaining of abdominal pain as described above.  She denies any new lumps or bumps on examination.  No new cough or hemoptysis.  Appetite is stable.  She denies any new neurological deficits including headaches, dizziness, double vision, LOC, and seizure.  Review of Systems  Constitutional: Negative.  Negative for chills, fever and weight loss.  HENT: Negative.   Eyes: Negative.   Respiratory: Negative.  Negative for cough.   Cardiovascular: Negative.  Negative for chest pain.  Gastrointestinal: Positive for abdominal pain. Negative for blood in stool, constipation, diarrhea, melena, nausea and vomiting.  Genitourinary: Negative.   Musculoskeletal: Negative.   Skin: Negative.   Neurological: Negative.  Negative for weakness.  Endo/Heme/Allergies: Negative.    Psychiatric/Behavioral: Negative.     Past Medical History:  Diagnosis Date  . Cancer (HCC)    NHL, Malt Lymphoma  . Depression   . Diverticulitis 04/28/2020  . 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 Dysphagia   . Dysrhythmia    History of palpatations  . GERD (gastroesophageal reflux disease)    INGESTION   . Heart palpitations   . Hiatal hernia   . Hyperlipidemia   . Interstitial cystitis   . Iron deficiency 04/28/2020  . Lung cancer, lower lobe ([place]) 07/21/2018   Left lower lobe, stage Ia squamous cell carcinoma  . 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 Sinusitis      PHYSICAL EXAMINATION  ECOG PERFORMANCE STATUS: 2 - Symptomatic, <50% confined to bed  Vitals:   04/28/20 1034  BP: 104/62  Pulse: 80  Resp: 18  Temp: (!) 96 F (35.6 C)  SpO2: 96%    GENERAL:alert, well developed and uncomfortable with abdominal pain, accompanied by family member SKIN: skin color, texture, turgor are normal HEAD: Normocephalic EYES: EOMI EARS: External ears normal OROPHARYNX: Not examined, mask in place NECK: supple, no adenopathy LYMPH:  no palpable lymphadenopathy BREAST:not examined LUNGS: clear to auscultation  HEART: regular rate & rhythm ABDOMEN: Tender in lower quadrants BACK: Back symmetric, no curvature. EXTREMITIES:less then 2 second capillary refill, no skin discoloration  NEURO: alert & oriented x 3 with fluent speech, no focal motor/sensory deficits, in wheelchair (I am not sure if this is her baseline).   LABORATORY DATA: CBC    Component Value Date/Time   WBC 5.8 04/22/2020 1208   RBC 4.37 04/22/2020 1208   HGB 13.2 04/22/2020 1208   HGB 12.1 10/24/2019 0941   HCT 41.1 04/22/2020 1208   HCT 38.3 10/24/2019 0941   PLT 273 04/22/2020 1208   PLT 312 10/24/2019 0941   MCV 94.1 04/22/2020 1208   MCV 89 10/24/2019 0941   MCH 30.2 04/22/2020 1208   MCHC 32.1 04/22/2020 1208   RDW 14.1 04/22/2020 1208   RDW 14.2 10/24/2019  0941   LYMPHSABS 2.3 04/22/2020 1208   MONOABS 0.5 04/22/2020 1208   EOSABS 0.1 04/22/2020 1208   BASOSABS 0.0 04/22/2020 1208      Chemistry      Component Value Date/Time   NA 137 04/22/2020 1208   NA 135 10/24/2019 0941   K 4.8 04/22/2020 1208   CL 98 04/22/2020 1208   CO2 30 04/22/2020 1208   BUN 11 04/22/2020 1208   BUN 13 10/24/2019 0941   CREATININE 0.84 04/22/2020 1208      Component Value Date/Time   CALCIUM 9.5 04/22/2020 1208   ALKPHOS 61 04/22/2020 1208   AST 23 04/22/2020 1208   ALT 19 04/22/2020 1208   BILITOT 0.4 04/22/2020 1208       RADIOGRAPHIC STUDIES:  CT Chest W Contrast  Result Date: 04/22/2020 CLINICAL DATA:  Left lower lobectomy 05/19/2018 for squamous cell lung cancer. Right lower lobectomy for squamous cell lung cancer 09/10/2019. Restaging. EXAM: CT CHEST WITH CONTRAST TECHNIQUE: Multidetector CT imaging of the chest was performed during intravenous contrast administration. CONTRAST:  17mL OMNIPAQUE IOHEXOL 300 MG/ML  SOLN COMPARISON:  01/03/2020 chest CT.  08/13/2019 PET-CT. FINDINGS: Cardiovascular: Top-normal heart size. No significant pericardial effusion/thickening. Three-vessel coronary atherosclerosis. Atherosclerotic nonaneurysmal thoracic  aorta. Stable top-normal caliber main pulmonary artery (3.4 cm diameter). No central pulmonary emboli. Mediastinum/Nodes: Subcentimeter hypodense left thyroid nodule is stable. Not clinically significant; no follow-up imaging recommended (ref: J Am Coll Radiol. 2015 Feb;12(2): 143-50). Unremarkable esophagus. No axillary adenopathy. No pathologically enlarged mediastinal or right hilar nodes. Mildly enlarged 1.1 cm left hilar node (series 2/image 66), previously 1.2 cm, not appreciably changed. Lungs/Pleura: No pneumothorax. 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 No left pleural effusion. Status post bilateral lower lobectomy. Moderate to severe centrilobular emphysema with mild diffuse  bronchial wall thickening. No acute consolidative airspace disease, lung masses or significant pulmonary nodules. Upper abdomen: Large hiatal hernia. Musculoskeletal: No aggressive appearing focal osseous lesions. Marked thoracic spondylosis. IMPRESSION: 1. No evidence of local tumor recurrence status post bilateral lower lobectomies. 2. No findings suspicious for metastatic disease in the chest. Mildly enlarged left hilar node is stable and presumably benign. 3. 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 4. Large hiatal hernia. 5. Aortic Atherosclerosis (ICD10-I70.0) and Emphysema (ICD10-J43.9). Electronically Signed   By: Ilona Sorrel M.D.   On: 04/22/2020 15:59       ASSESSMENT AND PLAN:  1. 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 -I do not recommend any adjuvant therapy. -We reviewed results of the CT chest from 01/03/2020.  Left hilar lymph node measuring 1.3 cm in short axis, difficult to characterize on prior PET scan from 08/13/2019.  Right hilar lymph node 0.7 cm is stable.  Severe emphysema present. -Based on these findings, I have recommended a CT scan in 3 months to follow-up on the left hilar lymph node. -She complains of easy shortness of breath on exertion.  She was told to continue to walk slowly every day to improve her respiratory status. 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 -Current scan does not show any evidence of recurrence.  Labs updated on 04/22/2020-WBC and differential are within normal limits, anabolic panel within normal limits excluding mild hyperglycemia at  152, iron studies suspicious for subclinical iron deficiency with a upper limits of normal TIBC of 402 and ferritin 17, B12 within normal limits, adequate folate level, and TSH within normal limits.  I personally reviewed and went over radiographic studies with the patient.  The results are noted within this dictation.  I personally reviewed the images in PACS.  CT chest on 04/22/2020 is negative for local tumor recurrence with bilateral lower lobectomies and no finding suspicious for metastatic disease in the chest with mildly enlarged left hilar node which is stable and presumably benign, chronic tiny dependent basilar right pleural effusion, and large hiatal hernia.  Emphysema is also noted.  Labs in 3 months: CBC diff, CMET, iron/TIBC, ferritin.  Will plan on repeat CT chest imaging in 6 months, sooner if clinically indicated.  Return in 3 months for follow-up.   2. MALToma ([place]) Gastric marginal zone lymphoma: -Gastric biopsy on 02/03/2016 consistent with extranodal marginal zone lymphoma of MALT.  H. pylori negative. -XRT 30 Gray in 15 fractions from 03/18/2016 through 04/18/2016. -EGD biopsy on 06/13/2018 shows mild chronic gastritis and small lymphoid aggregates with no features of lymphoma. -PET scan on 08/13/2019  did not show any abnormal uptake associated with abdominal organs.  No lymphadenopathy. -Previous LDH was normal.  She does not have any B symptoms. -Colonoscopy on 11/29/2018 showed diverticulosis in the sigmoid colon with 2 subcentimeter polyps in sigmoid colon. -She will have EGD scheduled soon.  3. Iron deficiency Laboratory work last week suspicious for iron deficiency with an upper limits of normal TIBC and low limits of normal ferritin at 17.  Negative colonoscopy in 11/2018.  We discussed treatment options for her iron deficiency including IV iron versus oral iron replacement therapy.  She has opted for oral iron replacement therapy.  Since she is not anemic and her iron  studies are officially "normal" but close to the abnormal range, feel comfortable pursuing oral iron replacement therapy at this time.  I recommended pediatric vitamin with Flintstone + iron twice daily.  We will check iron studies in 3 months.  If not improved, will need to consider IV iron replacement therapy and possibly GI work-up.  4. Diverticulitis Newly diagnosed by primary care provider.  She is on antibiotic therapy with Cipro and Flagyl.  She reports ongoing abdominal pain which is stable over the last 48 hours.  She is uncomfortable with her abdominal pain and is requesting pain medication today.  I have asked her to contact her primary care provider or her GI specialist (Dr. Sydell Axon) for further evaluation and management and possible pain medication invention.  Since this does not appear to be oncology related, I will not participate in pain medication prescribing at this time.  She is afebrile.  I have encouraged her to contact her providers described above this morning.   ORDERS PLACED FOR THIS ENCOUNTER: Orders Placed This Encounter  Procedures  . CBC with Differential/Platelet  . Comprehensive metabolic panel  . Iron and TIBC  . Ferritin    MEDICATIONS PRESCRIBED THIS ENCOUNTER: No orders of the defined types were placed in this encounter.   All questions were answered. The patient knows to call the clinic with any problems, questions or concerns. We can certainly see the patient much sooner if necessary.  Patient and plan discussed with Dr. Derek Jack and he is in agreement with the aforementioned.   This note is electronically signed by: Robynn Pane, PA-C 04/28/2020 11:06 AM

## 2020-04-28 NOTE — Patient Instructions (Addendum)
[place] at [facility] Discharge Instructions  You were seen today by Kirby Crigler PA. He went over your recent scan and lab results. He will repeat your scans in 6 months. Your iron level was low, he recommends you take 2 Flintstone Complete with Iron vitamins daily. He will see you back in 3 months for labs and follow up.   Thank you for choosing [place] at [facility] to provide your oncology and hematology care.  To afford each patient quality time with our provider, please arrive at least 15 minutes before your scheduled appointment time.   If you have a lab appointment with the [facility] please come in thru the  Main Entrance and check in at the main information desk  You need to re-schedule your appointment should you arrive 10 or more minutes late.  We strive to give you quality time with our providers, and arriving late affects you and other patients whose appointments are after yours.  Also, if you no show three or more times for appointments you may be dismissed from the clinic at the providers discretion.     Again, thank you for choosing [facility].  Our hope is that these requests will decrease the amount of time that you wait before being seen by our physicians.       _____________________________________________________________  Should you have questions after your visit to [facility], please contact our office at [phone] between the hours of 8:00 a.m. and 4:30 p.m.  Voicemails left after 4:00 p.m. will not be returned until the following business day.  For prescription refill requests, have your pharmacy contact our office and allow 72 hours.    Cancer Center Support Programs:   > Cancer Support Group  2nd Tuesday of the month 1pm-2pm, Journey Room

## 2020-04-29 ENCOUNTER — Ambulatory Visit (HOSPITAL_COMMUNITY): Payer: Medicare Other | Admitting: Oncology

## 2020-06-10 ENCOUNTER — Telehealth: Payer: Medicare Other | Admitting: Cardiovascular Disease

## 2020-06-18 ENCOUNTER — Encounter: Payer: Self-pay | Admitting: Cardiology

## 2020-06-18 ENCOUNTER — Ambulatory Visit (INDEPENDENT_AMBULATORY_CARE_PROVIDER_SITE_OTHER): Payer: Medicare Other | Admitting: Cardiology

## 2020-06-18 ENCOUNTER — Encounter: Payer: Self-pay | Admitting: *Deleted

## 2020-06-18 VITALS — BP 158/100 | HR 76 | Ht 66.0 in | Wt 175.2 lb

## 2020-06-18 DIAGNOSIS — E782 Mixed hyperlipidemia: Secondary | ICD-10-CM

## 2020-06-18 DIAGNOSIS — I480 Paroxysmal atrial fibrillation: Secondary | ICD-10-CM

## 2020-06-18 DIAGNOSIS — I10 Essential (primary) hypertension: Secondary | ICD-10-CM

## 2020-06-18 NOTE — Patient Instructions (Addendum)

## 2020-06-18 NOTE — Progress Notes (Signed)
Clinical Summary Ms. Robledo is a 79 y.o.female last seen by Dr Bronson Ing, this is our first visit together.  1. PAF - has not tolerated amio in the past - rare palpitations at times, short in duration - no bleeding on eliquis.    2. History of lung cancer - s/p resection  3. Hyperlipidemia - compliant with meds  4. HTN - has not taken meds yet - checks bp's once a week, typically 140s/70s - at recent oncology visit 104/70  SH:  Completed covid vaccine.   Past Medical History:  Diagnosis Date  . Cancer (HCC)    NHL, Malt Lymphoma  . Depression   . Diverticulitis 04/28/2020  . 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 Dysphagia   . Dysrhythmia    History of palpatations  . GERD (gastroesophageal reflux disease)    INGESTION   . Heart palpitations   . Hiatal hernia   . Hyperlipidemia   . Interstitial cystitis   . Iron deficiency 04/28/2020  . Lung cancer, lower lobe ([place]) 07/21/2018   Left lower lobe, stage Ia squamous cell carcinoma  . 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 Sinusitis      Allergies  Allergen Reactions  . Sulfur Hives     Current Outpatient Medications  Medication Sig Dispense Refill  . acetaminophen (TYLENOL) 325 MG tablet Take 650 mg by mouth every 6 (six) hours as needed for moderate pain or headache. (Patient not taking: Reported on 04/28/2020)    . apixaban (ELIQUIS) 5 MG TABS tablet Take 1 tablet (5 mg total) by mouth 2 (two) times daily. 3 tablet 0  . ciprofloxacin (CIPRO) 250 MG tablet SMARTSIG:1 Tablet(s) By Mouth Every 12 Hours    . ciprofloxacin (CIPRO) 500 MG tablet Take 500 mg by mouth 2 (two) times daily.    . citalopram (CELEXA) 20 MG tablet Take 20 mg by mouth daily.     Marland Kitchen losartan (COZAAR) 50 MG tablet Take 1 tablet (50 mg total) by mouth daily. 90 tablet 3  . metFORMIN (GLUCOPHAGE-XR) 500 MG 24 hr tablet Take 500 mg by mouth daily.     . metoprolol tartrate (LOPRESSOR) 25 MG tablet TAKE 1 TABLET  BY MOUTH TWICE A DAY 180 tablet 0  . metroNIDAZOLE (FLAGYL) 500 MG tablet Take 500 mg by mouth 3 (three) times daily.    . Multiple Vitamin (MULTIVITAMIN WITH MINERALS) TABS tablet Take 1 tablet by mouth daily.    . pioglitazone (ACTOS) 15 MG tablet Take 15 mg by mouth daily.    . Polyvinyl Alcohol-Povidone PF (REFRESH) 1.4-0.6 % SOLN Place 1 drop into both eyes as needed (Dry eyes).  (Patient not taking: Reported on 04/28/2020)    . pravastatin (PRAVACHOL) 40 MG tablet Take 40 mg by mouth at bedtime.     . triamcinolone lotion (KENALOG) 0.1 % SMARTSIG:1 Milliliter(s) Topical Twice Daily    . trimethoprim (TRIMPEX) 100 MG tablet Take 100 mg by mouth at bedtime.     No current facility-administered medications for this visit.     Past Surgical History:  Procedure Laterality Date  . ABDOMINAL HYSTERECTOMY    . BIOPSY  09/01/2016   Procedure: BIOPSY;  Surgeon: Daneil Dolin, MD;  [facility];  Service: Endoscopy;;  gastric  . BIOPSY  11/29/2018   Procedure: BIOPSY;  Surgeon: Daneil Dolin, MD;  [facility];  Service: Endoscopy;;  right colon  .  BLADDER SURGERY    . CATARACT EXTRACTION W/ INTRAOCULAR LENS  IMPLANT, BILATERAL  2015  . CATARACT EXTRACTION, BILATERAL    . COLONOSCOPY     Dr. Lindalou Hose 2009: Normal per PCP notes  . COLONOSCOPY N/A 11/29/2018   Procedure: COLONOSCOPY;  Surgeon: Daneil Dolin, MD;  [facility];  Service: Endoscopy;  Laterality: N/A;  7:30am  . ESOPHAGOGASTRODUODENOSCOPY     RMR: Prominant Schzgzki ring/component of peptic stricture status post dilation and disruption as described above, otherwise norma esophagus, moderate-sized hiatal hernia, antal pyloric channel, and posterier bulbar erosions, otherwise unremarkable stomach, D1 and D2 . Inflammatory findings on the stomach and duodenum will likely be related to aspirin effect. We need to rule out Helicobacter pylor  . ESOPHAGOGASTRODUODENOSCOPY N/A 03/10/2015   Dr. 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 Pathology with lymphoid population of stomach, slight atypia  . ESOPHAGOGASTRODUODENOSCOPY N/A 02/03/2016   Dr. Gala Romney: Schatzki ring noted at GE junction, dilated with 72 and then 53 Wedgefield dilator. Large hiatal hernia. 6 x 7 cm nodular geographically ulcerated mucosa, biopsy c/w MALToma  . ESOPHAGOGASTRODUODENOSCOPY N/A 04/08/2016   Dr. Gala Romney: moderate Schatzki's ring/web s/p dilation, large hiatal hernia, localized area of gastric lymphoma visualized and appeared to be much improved. normal second portion of the duodenum. No specimens collected. Esophageal lumen notably tighted up significantly since her dilation in April of this year.   . ESOPHAGOGASTRODUODENOSCOPY N/A 08/04/2016   Procedure: ESOPHAGOGASTRODUODENOSCOPY (EGD);  Surgeon: Daneil Dolin, MD;  [facility];  Service: Endoscopy;  Laterality: N/A;  7:30 am  . ESOPHAGOGASTRODUODENOSCOPY N/A 09/01/2016   Procedure: ESOPHAGOGASTRODUODENOSCOPY (EGD);  Surgeon: Daneil Dolin, MD;  [facility];  Service: Endoscopy;  Laterality: N/A;  830   . ESOPHAGOGASTRODUODENOSCOPY N/A 05/10/2017   Dr. Gala Romney: Moderate Schatzki ring at the GE junction, status post dilation with 59 [place].  Medium sized hiatal hernia.  Few localized erosions in the gastric antrum.  Stomach biopsy showed chronic gastritis, no H. pylori.  No atypical lymphoid infiltrates or other features of lymphoproliferative process  . ESOPHAGOGASTRODUODENOSCOPY N/A 06/13/2018   Dr. Gala Romney: Schatzki ring status post dilation.  Large hiatal hernia.  Focal area 4 x 4 cm along the greater curvature, somewhat erythematous and thickened mucosa, biopsy benign.  Next EGD in August 2020.  Marland Kitchen EYE SURGERY    . INTERCOSTAL NERVE BLOCK Right 09/10/2019   Procedure: Intercostal Nerve Block;  Surgeon: [name], MD;  Location: [place] [facility];  Service: Thoracic;  Laterality:  Right;  . LOBECTOMY Left 05/19/2018   Procedure: LEFT LOWER LOBECTOMY;  Surgeon: [name], MD;  Location: [place];  Service: Thoracic;  Laterality: Left;  . LYMPH NODE DISSECTION Right 09/10/2019   Procedure: Lymph Node Dissection;  Surgeon: [name], MD;  Location: [place];  Service: Thoracic;  Laterality: Right;  . MALONEY DILATION N/A 03/10/2015   Procedure: Venia Minks DILATION;  Surgeon: Daneil Dolin, MD;  [facility];  Service: Endoscopy;  Laterality: N/A;  . Venia Minks DILATION N/A 02/03/2016   Procedure: Venia Minks DILATION;  Surgeon: Daneil Dolin, MD;  [facility];  Service: Endoscopy;  Laterality: N/A;  . Venia Minks DILATION N/A 04/08/2016   Procedure: Venia Minks DILATION;  Surgeon: Daneil Dolin, MD;  [facility];  Service: Endoscopy;  Laterality: N/A;  . Venia Minks DILATION N/A 05/10/2017   Procedure: Venia Minks DILATION;  Surgeon: Daneil Dolin, MD;  [facility];  Service: Endoscopy;  Laterality: N/A;  . Venia Minks DILATION N/A 06/13/2018   Procedure: Venia Minks DILATION;  Surgeon: Daneil Dolin, MD;  [facility];  Service: Endoscopy;  Laterality: N/A;  . POLYPECTOMY  11/29/2018   Procedure: POLYPECTOMY;  Surgeon: Daneil Dolin, MD;  [facility];  Service: Endoscopy;;  . VIDEO ASSISTED THORACOSCOPY (VATS)/ LOBECTOMY Right 09/10/2019   Procedure: VIDEO ASSISTED THORACOSCOPY (VATS)/RIGHT LOWER LOBECTOMY;  Surgeon: [name], MD;  Location: [place];  Service: Thoracic;  Laterality: Right;  Marland Kitchen VIDEO ASSISTED THORACOSCOPY (VATS)/WEDGE RESECTION Left 05/19/2018   Procedure: VIDEO ASSISTED THORACOSCOPY (VATS)/WEDGE RESECTION;  Surgeon: [name], MD;  Location: [place];  Service: Thoracic;  Laterality: Left;     Allergies  Allergen Reactions  . Sulfur Hives      Family History  Problem Relation Age of Onset  . Heart attack Father   . Dementia Father   . Diabetes Sister   . Diabetes Brother     . Diabetes Sister   . Colon cancer Neg Hx      Social History Ms. Tindol reports that she quit smoking about 8 months ago. Her smoking use included cigarettes. She has a 28.50 pack-year smoking history. She has never used smokeless tobacco. Ms. Critzer reports no history of alcohol use.   Review of Systems CONSTITUTIONAL: No weight loss, fever, chills, weakness or fatigue.  HEENT: Eyes: No visual loss, blurred vision, double vision or yellow sclerae.No hearing loss, sneezing, congestion, runny nose or sore throat.  SKIN: No rash or itching.  CARDIOVASCULAR: per hpi RESPIRATORY: No shortness of breath, cough or sputum.  GASTROINTESTINAL: No anorexia, nausea, vomiting or diarrhea. No abdominal pain or blood.  GENITOURINARY: No burning on urination, no polyuria NEUROLOGICAL: No headache, dizziness, syncope, paralysis, ataxia, numbness or tingling in the extremities. No change in bowel or bladder control.  MUSCULOSKELETAL: No muscle, back pain, joint pain or stiffness.  LYMPHATICS: No enlarged nodes. No history of splenectomy.  PSYCHIATRIC: No history of depression or anxiety.  ENDOCRINOLOGIC: No reports of sweating, cold or heat intolerance. No polyuria or polydipsia.  Marland Kitchen   Physical Examination Today's Vitals   06/18/20 0839  BP: (!) 158/100  Pulse: 76  SpO2: 98%  Weight: 175 lb 3.2 oz (79.5 kg)  Height: 5\' 6"  (1.676 m)   Body mass index is 28.28 kg/m.  Gen: resting comfortably, no acute distress HEENT: no scleral icterus, pupils equal round and reactive, no palptable cervical adenopathy,  CV: RRR, no m/r/g, no jvd Resp: Clear to auscultation bilaterally GI: abdomen is soft, non-tender, non-distended, normal bowel sounds, no hepatosplenomegaly MSK: extremities are warm, no edema.  Skin: warm, no rash Neuro:  no focal deficits Psych: appropriate affect   Diagnostic Studies  ECHO 2019: - Left ventricle: The cavity size was normal. Wall thickness was increased in a  pattern of moderate LVH. Systolic function was vigorous. The estimated ejection fraction was in the range of 65% to 70%. Wall motion was normal; there were no regional wall motion abnormalities. Features are consistent with a pseudonormal left ventricular filling pattern, with concomitant abnormal relaxation and increased filling pressure (grade 2 diastolic dysfunction). - Aortic valve: Mildly calcified annulus. Trileaflet. - Mitral valve: There was mild regurgitation. - Left atrium: The atrium was moderately dilated. - Right atrium: Central venous pressure (est): 3 mm Hg. - Atrial septum: No defect or patent foramen ovale was identified. - Tricuspid valve: There was trivial regurgitation. - Pulmonary  arteries: Systolic pressure could not be accurately estimated. - Pericardium, extracardiac: There was no pericardial effusion   Assessment and Plan  1. PAF - overall doing well, continue current meds including anticoag  2. HTN - elevated today but has not taken meds, yet, well controlled at recent oncology visit - continue current meds, she will monitor at home  3. Hyperlipidemia - request labs from pcp - continue statin     Arnoldo Lenis, M.D.

## 2020-07-14 DIAGNOSIS — F17200 Nicotine dependence, unspecified, uncomplicated: Secondary | ICD-10-CM | POA: Diagnosis not present

## 2020-07-14 DIAGNOSIS — F17210 Nicotine dependence, cigarettes, uncomplicated: Secondary | ICD-10-CM | POA: Diagnosis not present

## 2020-07-14 DIAGNOSIS — E1165 Type 2 diabetes mellitus with hyperglycemia: Secondary | ICD-10-CM | POA: Diagnosis not present

## 2020-07-14 DIAGNOSIS — Z6828 Body mass index (BMI) 28.0-28.9, adult: Secondary | ICD-10-CM | POA: Diagnosis not present

## 2020-07-14 DIAGNOSIS — E782 Mixed hyperlipidemia: Secondary | ICD-10-CM | POA: Diagnosis not present

## 2020-07-14 DIAGNOSIS — Z0001 Encounter for general adult medical examination with abnormal findings: Secondary | ICD-10-CM | POA: Diagnosis not present

## 2020-07-14 DIAGNOSIS — C801 Malignant (primary) neoplasm, unspecified: Secondary | ICD-10-CM | POA: Diagnosis not present

## 2020-07-14 DIAGNOSIS — Z720 Tobacco use: Secondary | ICD-10-CM | POA: Diagnosis not present

## 2020-07-14 DIAGNOSIS — I10 Essential (primary) hypertension: Secondary | ICD-10-CM | POA: Diagnosis not present

## 2020-07-16 DIAGNOSIS — R5383 Other fatigue: Secondary | ICD-10-CM | POA: Diagnosis not present

## 2020-07-16 DIAGNOSIS — M25472 Effusion, left ankle: Secondary | ICD-10-CM | POA: Diagnosis not present

## 2020-07-16 DIAGNOSIS — R233 Spontaneous ecchymoses: Secondary | ICD-10-CM | POA: Diagnosis not present

## 2020-07-16 DIAGNOSIS — C3490 Malignant neoplasm of unspecified part of unspecified bronchus or lung: Secondary | ICD-10-CM | POA: Diagnosis not present

## 2020-07-16 DIAGNOSIS — F411 Generalized anxiety disorder: Secondary | ICD-10-CM | POA: Diagnosis not present

## 2020-07-16 DIAGNOSIS — E782 Mixed hyperlipidemia: Secondary | ICD-10-CM | POA: Diagnosis not present

## 2020-07-16 DIAGNOSIS — E1165 Type 2 diabetes mellitus with hyperglycemia: Secondary | ICD-10-CM | POA: Diagnosis not present

## 2020-07-16 DIAGNOSIS — R05 Cough: Secondary | ICD-10-CM | POA: Diagnosis not present

## 2020-07-16 DIAGNOSIS — C884 Extranodal marginal zone B-cell lymphoma of mucosa-associated lymphoid tissue [MALT-lymphoma]: Secondary | ICD-10-CM | POA: Diagnosis not present

## 2020-07-16 DIAGNOSIS — I10 Essential (primary) hypertension: Secondary | ICD-10-CM | POA: Diagnosis not present

## 2020-07-16 DIAGNOSIS — Z6827 Body mass index (BMI) 27.0-27.9, adult: Secondary | ICD-10-CM | POA: Diagnosis not present

## 2020-07-16 DIAGNOSIS — Z720 Tobacco use: Secondary | ICD-10-CM | POA: Diagnosis not present

## 2020-07-16 DIAGNOSIS — Z23 Encounter for immunization: Secondary | ICD-10-CM | POA: Diagnosis not present

## 2020-07-23 ENCOUNTER — Other Ambulatory Visit: Payer: Self-pay

## 2020-07-23 ENCOUNTER — Inpatient Hospital Stay (HOSPITAL_COMMUNITY): Payer: Medicare Other | Attending: Hematology

## 2020-07-23 DIAGNOSIS — F17210 Nicotine dependence, cigarettes, uncomplicated: Secondary | ICD-10-CM | POA: Diagnosis not present

## 2020-07-23 DIAGNOSIS — Z90710 Acquired absence of both cervix and uterus: Secondary | ICD-10-CM | POA: Diagnosis not present

## 2020-07-23 DIAGNOSIS — Z902 Acquired absence of lung [part of]: Secondary | ICD-10-CM | POA: Diagnosis not present

## 2020-07-23 DIAGNOSIS — Z7901 Long term (current) use of anticoagulants: Secondary | ICD-10-CM | POA: Insufficient documentation

## 2020-07-23 DIAGNOSIS — C3432 Malignant neoplasm of lower lobe, left bronchus or lung: Secondary | ICD-10-CM

## 2020-07-23 DIAGNOSIS — E119 Type 2 diabetes mellitus without complications: Secondary | ICD-10-CM | POA: Insufficient documentation

## 2020-07-23 DIAGNOSIS — Z8572 Personal history of non-Hodgkin lymphomas: Secondary | ICD-10-CM | POA: Insufficient documentation

## 2020-07-23 DIAGNOSIS — Z85118 Personal history of other malignant neoplasm of bronchus and lung: Secondary | ICD-10-CM | POA: Diagnosis not present

## 2020-07-23 DIAGNOSIS — Z7984 Long term (current) use of oral hypoglycemic drugs: Secondary | ICD-10-CM | POA: Insufficient documentation

## 2020-07-23 DIAGNOSIS — Z79899 Other long term (current) drug therapy: Secondary | ICD-10-CM | POA: Diagnosis not present

## 2020-07-23 DIAGNOSIS — K2950 Unspecified chronic gastritis without bleeding: Secondary | ICD-10-CM | POA: Diagnosis not present

## 2020-07-23 DIAGNOSIS — E611 Iron deficiency: Secondary | ICD-10-CM

## 2020-07-23 LAB — CBC WITH DIFFERENTIAL/PLATELET
Abs Immature Granulocytes: 0.01 10*3/uL (ref 0.00–0.07)
Basophils Absolute: 0.1 10*3/uL (ref 0.0–0.1)
Basophils Relative: 1 %
Eosinophils Absolute: 0.1 10*3/uL (ref 0.0–0.5)
Eosinophils Relative: 1 %
HCT: 39.2 % (ref 36.0–46.0)
Hemoglobin: 12.6 g/dL (ref 12.0–15.0)
Immature Granulocytes: 0 %
Lymphocytes Relative: 34 %
Lymphs Abs: 2 10*3/uL (ref 0.7–4.0)
MCH: 29.6 pg (ref 26.0–34.0)
MCHC: 32.1 g/dL (ref 30.0–36.0)
MCV: 92 fL (ref 80.0–100.0)
Monocytes Absolute: 0.5 10*3/uL (ref 0.1–1.0)
Monocytes Relative: 8 %
Neutro Abs: 3.2 10*3/uL (ref 1.7–7.7)
Neutrophils Relative %: 56 %
Platelets: 275 10*3/uL (ref 150–400)
RBC: 4.26 MIL/uL (ref 3.87–5.11)
RDW: 14.6 % (ref 11.5–15.5)
WBC: 5.8 10*3/uL (ref 4.0–10.5)
nRBC: 0 % (ref 0.0–0.2)

## 2020-07-23 LAB — COMPREHENSIVE METABOLIC PANEL
ALT: 17 U/L (ref 0–44)
AST: 20 U/L (ref 15–41)
Albumin: 3.9 g/dL (ref 3.5–5.0)
Alkaline Phosphatase: 62 U/L (ref 38–126)
Anion gap: 12 (ref 5–15)
BUN: 8 mg/dL (ref 8–23)
CO2: 26 mmol/L (ref 22–32)
Calcium: 9.6 mg/dL (ref 8.9–10.3)
Chloride: 99 mmol/L (ref 98–111)
Creatinine, Ser: 0.74 mg/dL (ref 0.44–1.00)
GFR calc Af Amer: >60 mL/min (ref >60)
GFR calc non Af Amer: >60 mL/min (ref >60)
Glucose, Bld: 223 mg/dL — ABNORMAL HIGH (ref 70–99)
Potassium: 4.3 mmol/L (ref 3.5–5.1)
Sodium: 137 mmol/L (ref 135–145)
Total Bilirubin: 0.7 mg/dL (ref 0.3–1.2)
Total Protein: 7.4 g/dL (ref 6.5–8.1)

## 2020-07-23 LAB — IRON AND TIBC
Iron: 88 ug/dL (ref 28–170)
Saturation Ratios: 24 % (ref 10.4–31.8)
TIBC: 372 ug/dL (ref 250–450)
UIBC: 284 ug/dL

## 2020-07-23 LAB — FERRITIN: Ferritin: 22 ng/mL (ref 11–307)

## 2020-07-30 ENCOUNTER — Other Ambulatory Visit: Payer: Self-pay

## 2020-07-30 ENCOUNTER — Inpatient Hospital Stay (HOSPITAL_BASED_OUTPATIENT_CLINIC_OR_DEPARTMENT_OTHER): Payer: Medicare Other | Admitting: Nurse Practitioner

## 2020-07-30 VITALS — BP 148/71 | HR 69 | Temp 96.9°F | Resp 18 | Wt 175.9 lb

## 2020-07-30 DIAGNOSIS — C3432 Malignant neoplasm of lower lobe, left bronchus or lung: Secondary | ICD-10-CM | POA: Diagnosis not present

## 2020-07-30 DIAGNOSIS — Z902 Acquired absence of lung [part of]: Secondary | ICD-10-CM | POA: Diagnosis not present

## 2020-07-30 DIAGNOSIS — Z85118 Personal history of other malignant neoplasm of bronchus and lung: Secondary | ICD-10-CM | POA: Diagnosis not present

## 2020-07-30 DIAGNOSIS — E119 Type 2 diabetes mellitus without complications: Secondary | ICD-10-CM | POA: Diagnosis not present

## 2020-07-30 DIAGNOSIS — Z8572 Personal history of non-Hodgkin lymphomas: Secondary | ICD-10-CM | POA: Diagnosis not present

## 2020-07-30 DIAGNOSIS — F17210 Nicotine dependence, cigarettes, uncomplicated: Secondary | ICD-10-CM | POA: Diagnosis not present

## 2020-07-30 DIAGNOSIS — K2950 Unspecified chronic gastritis without bleeding: Secondary | ICD-10-CM | POA: Diagnosis not present

## 2020-07-30 NOTE — Progress Notes (Signed)
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 Gala Romney, abnormal gastric mucosa. Pathology with EXTRANODAL Marginal zone lymphoma. NEGATIVE for H. Pylori   03/03/2016 Miscellaneous   H pylori stool antigen NEGATIVE   03/03/2016 PET scan   Focal area of hypermetabolism and wall thickening involving the body antral junction region of the stomach c/w history of lymphoma. 5.5 mm LLL pulm nodule not hypermetabolic. Non contrast chest CT in 6 month   03/18/2016 - 04/08/2016 Radiation Therapy   The gastric tumor received 30 Gy in 15 fractions of 2 Gy   Lung cancer ([place])  05/19/2018 Initial Diagnosis   Lung cancer ([place])   06/06/2018 Cancer Staging   Staging form: Lung, AJCC 8th Edition - Clinical: cT1b, cN0 - Signed by Zoila Shutter, MD on 06/06/2018     CANCER STAGING: Cancer Staging Lung cancer [facility]) Staging form: Lung, AJCC 8th Edition - Clinical: cT1b, cN0 - Signed by Zoila Shutter, MD on 06/06/2018  MALToma [facility]) Staging form: Lymphoid Neoplasms, AJCC 6th Edition - Clinical stage from 03/23/2016: Stage I - Signed by Baird Cancer, PA-C on 03/23/2016    INTERVAL HISTORY:  Ms. Aragones 79 y.o. female returns for routine follow-up for lung cancer.  Patient reports she is doing well since her last visit.  She reports she still has fatigue throughout the day.  She denies any new cough or shortness of breath. Denies any nausea, vomiting, or diarrhea. Denies any new pains. Had not noticed any recent bleeding such as epistaxis, hematuria or hematochezia. Denies recent chest pain on exertion, shortness of breath on minimal exertion, pre-syncopal  episodes, or palpitations. Denies any numbness or tingling in hands or feet. Denies any recent fevers, infections, or recent hospitalizations. Patient reports appetite at 100% and energy level at 40%.  She is eating well maintain her weight at this time.    REVIEW OF SYSTEMS:  Review of Systems  Constitutional: Positive for fatigue.  Respiratory: Positive for shortness of breath (With exertion).   All other systems reviewed and are negative.    PAST MEDICAL/SURGICAL HISTORY:  Past Medical History:  Diagnosis Date  . Cancer (HCC)    NHL, Malt Lymphoma  . Depression   . Diverticulitis 04/28/2020  . 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 Dysphagia   . Dysrhythmia    History of palpatations  . GERD (gastroesophageal reflux disease)    INGESTION   . Heart palpitations   . Hiatal hernia   . Hyperlipidemia   . Interstitial cystitis   . Iron deficiency 04/28/2020  . Lung cancer, lower lobe ([place]) 07/21/2018   Left lower lobe, stage Ia squamous cell carcinoma  . 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 Sinusitis    Past Surgical History:  Procedure Laterality Date  . ABDOMINAL HYSTERECTOMY    . BIOPSY  09/01/2016   Procedure: BIOPSY;  Surgeon: Daneil Dolin, MD;  [facility];  Service: Endoscopy;;  gastric  . BIOPSY  11/29/2018   Procedure: BIOPSY;  Surgeon: Daneil Dolin, MD;  [facility];  Service: Endoscopy;;  right colon  . BLADDER SURGERY    . CATARACT EXTRACTION W/ INTRAOCULAR LENS  IMPLANT, BILATERAL  2015  . CATARACT EXTRACTION, BILATERAL    . COLONOSCOPY     Dr. Lindalou Hose 2009: Normal per PCP notes  . COLONOSCOPY N/A 11/29/2018   Procedure: COLONOSCOPY;  Surgeon: Daneil Dolin, MD;  [facility];  Service: Endoscopy;  Laterality: N/A;  7:30am  . ESOPHAGOGASTRODUODENOSCOPY     RMR: Prominant Schzgzki ring/component of peptic stricture status post dilation and disruption as described above, otherwise norma  esophagus, moderate-sized hiatal hernia, antal pyloric channel, and posterier bulbar erosions, otherwise unremarkable stomach, D1 and D2 . Inflammatory findings on the stomach and duodenum will likely be related to aspirin effect. We need to rule out Helicobacter pylor  . ESOPHAGOGASTRODUODENOSCOPY N/A 03/10/2015   Dr. 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 Pathology with lymphoid population of stomach, slight atypia  . ESOPHAGOGASTRODUODENOSCOPY N/A 02/03/2016   Dr. Gala Romney: Schatzki ring noted at GE junction, dilated with 59 and then 3 Walland dilator. Large hiatal hernia. 6 x 7 cm nodular geographically ulcerated mucosa, biopsy c/w MALToma  . ESOPHAGOGASTRODUODENOSCOPY N/A 04/08/2016   Dr. Gala Romney: moderate Schatzki's ring/web s/p dilation, large hiatal hernia, localized area of gastric lymphoma visualized and appeared to be much improved. normal second portion of the duodenum. No specimens collected. Esophageal lumen notably tighted up significantly since her dilation in April of this year.   . ESOPHAGOGASTRODUODENOSCOPY N/A 08/04/2016   Procedure: ESOPHAGOGASTRODUODENOSCOPY (EGD);  Surgeon: Daneil Dolin, MD;  [facility];  Service: Endoscopy;  Laterality: N/A;  7:30 am  . ESOPHAGOGASTRODUODENOSCOPY N/A 09/01/2016   Procedure: ESOPHAGOGASTRODUODENOSCOPY (EGD);  Surgeon: Daneil Dolin, MD;  [facility];  Service: Endoscopy;  Laterality: N/A;  830   . ESOPHAGOGASTRODUODENOSCOPY N/A 05/10/2017   Dr. Gala Romney: Moderate Schatzki ring at the GE junction, status post dilation with 64 [place].  Medium sized hiatal hernia.  Few localized erosions in the gastric antrum.  Stomach biopsy showed chronic gastritis, no H. pylori.  No atypical lymphoid infiltrates or other features of lymphoproliferative process  . ESOPHAGOGASTRODUODENOSCOPY N/A 06/13/2018   Dr. Gala Romney: Schatzki ring status post dilation.  Large hiatal hernia.   Focal area 4 x 4 cm along the greater curvature, somewhat erythematous and thickened mucosa, biopsy benign.  Next EGD in August 2020.  Marland Kitchen EYE SURGERY    . INTERCOSTAL NERVE BLOCK Right 09/10/2019   Procedure: Intercostal Nerve Block;  Surgeon: [name], MD;  Location: [place];  Service: Thoracic;  Laterality: Right;  . LOBECTOMY Left 05/19/2018   Procedure: LEFT LOWER LOBECTOMY;  Surgeon: [name], MD;  Location: [place];  Service: Thoracic;  Laterality: Left;  . LYMPH NODE DISSECTION Right 09/10/2019   Procedure: Lymph Node Dissection;  Surgeon: [name], MD;  Location: [place];  Service: Thoracic;  Laterality: Right;  . MALONEY DILATION N/A 03/10/2015   Procedure: Venia Minks DILATION;  Surgeon: Daneil Dolin, MD;  [facility];  Service: Endoscopy;  Laterality: N/A;  . Venia Minks DILATION N/A 02/03/2016   Procedure: Venia Minks DILATION;  Surgeon: Daneil Dolin, MD;  [facility];  Service: Endoscopy;  Laterality: N/A;  . Venia Minks DILATION N/A 04/08/2016   Procedure: Venia Minks DILATION;  Surgeon: Daneil Dolin, MD;  [facility];  Service: Endoscopy;  Laterality: N/A;  . MALONEY DILATION N/A 05/10/2017   Procedure: Venia Minks  DILATION;  Surgeon: Daneil Dolin, MD;  [facility];  Service: Endoscopy;  Laterality: N/A;  . Venia Minks DILATION N/A 06/13/2018   Procedure: Venia Minks DILATION;  Surgeon: Daneil Dolin, MD;  [facility];  Service: Endoscopy;  Laterality: N/A;  . POLYPECTOMY  11/29/2018   Procedure: POLYPECTOMY;  Surgeon: Daneil Dolin, MD;  [facility];  Service: Endoscopy;;  . VIDEO ASSISTED THORACOSCOPY (VATS)/ LOBECTOMY Right 09/10/2019   Procedure: VIDEO ASSISTED THORACOSCOPY (VATS)/RIGHT LOWER LOBECTOMY;  Surgeon: [name], MD;  Location: [place];  Service: Thoracic;  Laterality: Right;  Marland Kitchen VIDEO ASSISTED THORACOSCOPY (VATS)/WEDGE RESECTION Left 05/19/2018   Procedure: VIDEO ASSISTED THORACOSCOPY  (VATS)/WEDGE RESECTION;  Surgeon: [name], MD;  [facility];  Service: Thoracic;  Laterality: Left;     SOCIAL HISTORY:  Social History   Socioeconomic History  . Marital status: Married    Spouse name: Not on file  . Number of children: Not on file  . Years of education: Not on file  . Highest education level: Not on file  Occupational History  . Not on file  Tobacco Use  . Smoking status: Current Some Day Smoker    Packs/day: 0.50    Years: 57.00    Pack years: 28.50    Types: Cigarettes    Last attempt to quit: 09/24/2019    Years since quitting: 0.8  . Smokeless tobacco: Never Used  . Tobacco comment: trying to quit, wearing patch  Vaping Use  . Vaping Use: Never used  Substance and Sexual Activity  . Alcohol use: No    Alcohol/week: 0.0 standard drinks  . Drug use: No  . Sexual activity: Not on file  Other Topics Concern  . Not on file  Social History Narrative  . Not on file   Social Determinants of Health   Financial Resource Strain:   . Difficulty of Paying Living Expenses: Not on file  Food Insecurity:   . Worried About Charity fundraiser in the Last Year: Not on file  . Ran Out of Food in the Last Year: Not on file  Transportation Needs:   . Lack of Transportation (Medical): Not on file  . Lack of Transportation (Non-Medical): Not on file  Physical Activity:   . Days of Exercise per Week: Not on file  . Minutes of Exercise per Session: Not on file  Stress:   . Feeling of Stress : Not on file  Social Connections:   . Frequency of Communication with Friends and Family: Not on file  . Frequency of Social Gatherings with Friends and Family: Not on file  . Attends Religious Services: Not on file  . Active Member of Clubs or Organizations: Not on file  . Attends Archivist Meetings: Not on file  . Marital Status: Not on file  Intimate Partner Violence:   . Fear of Current or Ex-Partner: Not on file  . Emotionally Abused:  Not on file  . Physically Abused: Not on file  . Sexually Abused: Not on file    FAMILY HISTORY:  Family History  Problem Relation Age of Onset  . Heart attack Father   . Dementia Father   . Diabetes Sister   . Diabetes Brother   . Diabetes Sister   . Colon cancer Neg Hx     CURRENT MEDICATIONS:  Outpatient Encounter Medications as of 07/30/2020  Medication Sig  . acetaminophen (TYLENOL) 325 MG tablet Take 650 mg by  mouth every 6 (six) hours as needed for moderate pain or headache.   Marland Kitchen apixaban (ELIQUIS) 5 MG TABS tablet Take 1 tablet (5 mg total) by mouth 2 (two) times daily.  . citalopram (CELEXA) 20 MG tablet Take 20 mg by mouth daily.   Marland Kitchen losartan (COZAAR) 50 MG tablet Take 1 tablet (50 mg total) by mouth daily.  . metFORMIN (GLUCOPHAGE-XR) 500 MG 24 hr tablet Take 500 mg by mouth daily.   . metoprolol tartrate (LOPRESSOR) 25 MG tablet TAKE 1 TABLET BY MOUTH TWICE A DAY  . Multiple Vitamin (MULTIVITAMIN WITH MINERALS) TABS tablet Take 1 tablet by mouth daily.  . Polyvinyl Alcohol-Povidone PF (REFRESH) 1.4-0.6 % SOLN Place 1 drop into both eyes as needed (Dry eyes).   . pravastatin (PRAVACHOL) 40 MG tablet Take 40 mg by mouth at bedtime.   . [DISCONTINUED] pioglitazone (ACTOS) 15 MG tablet Take 15 mg by mouth daily.  . [DISCONTINUED] triamcinolone lotion (KENALOG) 0.1 % SMARTSIG:1 Milliliter(s) Topical Twice Daily  . lidocaine-prilocaine (EMLA) cream Apply topically as needed. (Patient not taking: Reported on 07/30/2020)   No facility-administered encounter medications on file as of 07/30/2020.    ALLERGIES:  Allergies  Allergen Reactions  . Sulfur Hives     PHYSICAL EXAM:  ECOG Performance status: 1  Vitals:   07/30/20 1105  BP: (!) 148/71  Pulse: 69  Resp: 18  Temp: (!) 96.9 F (36.1 C)  SpO2: 97%   Filed Weights   07/30/20 1105  Weight: 175 lb 14.8 oz (79.8 kg)   Physical Exam Constitutional:      Appearance: Normal appearance. She is normal weight.    Cardiovascular:     Rate and Rhythm: Normal rate and regular rhythm.     Heart sounds: Normal heart sounds.  Pulmonary:     Effort: Pulmonary effort is normal.     Breath sounds: Normal breath sounds.  Abdominal:     General: Bowel sounds are normal.     Palpations: Abdomen is soft.  Musculoskeletal:        General: Normal range of motion.  Skin:    General: Skin is warm.  Neurological:     Mental Status: She is alert and oriented to person, place, and time. Mental status is at baseline.  Psychiatric:        Mood and Affect: Mood normal.        Behavior: Behavior normal.        Thought Content: Thought content normal.        Judgment: Judgment normal.      LABORATORY DATA:  I have reviewed the labs as listed.  CBC    Component Value Date/Time   WBC 5.8 07/23/2020 1102   RBC 4.26 07/23/2020 1102   HGB 12.6 07/23/2020 1102   HGB 12.1 10/24/2019 0941   HCT 39.2 07/23/2020 1102   HCT 38.3 10/24/2019 0941   PLT 275 07/23/2020 1102   PLT 312 10/24/2019 0941   MCV 92.0 07/23/2020 1102   MCV 89 10/24/2019 0941   MCH 29.6 07/23/2020 1102   MCHC 32.1 07/23/2020 1102   RDW 14.6 07/23/2020 1102   RDW 14.2 10/24/2019 0941   LYMPHSABS 2.0 07/23/2020 1102   MONOABS 0.5 07/23/2020 1102   EOSABS 0.1 07/23/2020 1102   BASOSABS 0.1 07/23/2020 1102   CMP Latest Ref Rng & Units 07/23/2020 04/22/2020 01/03/2020  Glucose 70 - 99 mg/dL 223(H) 152(H) 193(H)  BUN 8 - 23 mg/dL 8 11 18   Creatinine 0.44 -  1.00 mg/dL 0.74 0.84 1.06(H)  Sodium 135 - 145 mmol/L 137 137 132(L)  Potassium 3.5 - 5.1 mmol/L 4.3 4.8 4.3  Chloride 98 - 111 mmol/L 99 98 98  CO2 22 - 32 mmol/L 26 30 26   Calcium 8.9 - 10.3 mg/dL 9.6 9.5 9.0  Total Protein 6.5 - 8.1 g/dL 7.4 7.7 7.7  Total Bilirubin 0.3 - 1.2 mg/dL 0.7 0.4 0.3  Alkaline Phos 38 - 126 U/L 62 61 55  AST 15 - 41 U/L 20 23 19   ALT 0 - 44 U/L 17 19 17    All questions were answered to patient's stated satisfaction. Encouraged patient to call with any  new concerns or questions before his next visit to the cancer center and we can certain see him sooner, if needed.     ASSESSMENT & PLAN:  Lung cancer ([place]) 1.  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 -I do not recommend any adjuvant therapy. -We reviewed results of the CT chest from 01/03/2020.  Left hilar lymph node measuring 1.3 cm in short axis, difficult to characterize on prior PET scan from 08/13/2019.  Right hilar lymph node 0.7 cm is stable.  Severe emphysema present. -She complains of easy shortness of breath on exertion.  She was told to continue to walk slowly every day to improve her respiratory status. -She also reports tiredness. -CT scan on 04/22/2020 showed no evidence of local tumor recurrence status post bilateral lower lobectomies.  No findings suspicious for metastatic disease in the chest.  Mildly enlarged left hilar node is stable and presumably benign.  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 -She will follow-up in 4 months with repeat labs and CT scan.  2.  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 -Current scan does not show any evidence of recurrence.  3.  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  H. pylori negative. -XRT 30 Gray in 15 fractions from 03/18/2016 through 04/18/2016. -EGD biopsy on 06/13/2018 shows mild chronic gastritis and small lymphoid aggregates with no features of lymphoma. -PET scan on 08/13/2019 did not show any abnormal uptake associated with abdominal organs.  No lymphadenopathy. -Previous LDH was normal.  She does  not have any B symptoms. -Colonoscopy on 11/29/2018 showed diverticulosis in the sigmoid colon with 2 subcentimeter polyps in sigmoid colon. -She will have EGD scheduled soon.     Orders placed this encounter:  Orders Placed This Encounter  Procedures  . CT CHEST W CONTRAST  . CBC with Differential/Platelet  . Comprehensive metabolic panel  . Ferritin  . Iron and TIBC  . Lactate dehydrogenase  . Vitamin B12  . VITAMIN D 25 Hydroxy (Vit-D Deficiency, Fractures)  . Folate      Francene Finders, FNP-C [place] [phone]

## 2020-07-30 NOTE — Assessment & Plan Note (Signed)
1.  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 -I do not recommend any adjuvant therapy. -We reviewed results of the CT chest from 01/03/2020.  Left hilar lymph node measuring 1.3 cm in short axis, difficult to characterize on prior PET scan from 08/13/2019.  Right hilar lymph node 0.7 cm is stable.  Severe emphysema present. -She complains of easy shortness of breath on exertion.  She was told to continue to walk slowly every day to improve her respiratory status. -She also reports tiredness. -CT scan on 04/22/2020 showed no evidence of local tumor recurrence status post bilateral lower lobectomies.  No findings suspicious for metastatic disease in the chest.  Mildly enlarged left hilar node is stable and presumably benign.  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 -She will follow-up in 4 months with repeat labs and CT scan.  2.  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 -Current scan does not show any evidence of recurrence.  3.  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  H. pylori negative. -XRT 30 Gray in 15 fractions from 03/18/2016 through 04/18/2016. -EGD biopsy on 06/13/2018 shows mild chronic gastritis and small lymphoid aggregates with no features of lymphoma. -PET scan on 08/13/2019 did not show any abnormal uptake associated with abdominal organs.  No lymphadenopathy. -Previous LDH was normal.  She does not have any B symptoms. -Colonoscopy on 11/29/2018 showed diverticulosis in the sigmoid colon with 2 subcentimeter polyps in sigmoid colon. -She will have EGD  scheduled soon.

## 2020-08-14 DIAGNOSIS — N3020 Other chronic cystitis without hematuria: Secondary | ICD-10-CM | POA: Diagnosis not present

## 2020-08-14 DIAGNOSIS — R300 Dysuria: Secondary | ICD-10-CM | POA: Diagnosis not present

## 2020-08-19 DIAGNOSIS — Z23 Encounter for immunization: Secondary | ICD-10-CM | POA: Diagnosis not present

## 2020-09-08 DIAGNOSIS — N3020 Other chronic cystitis without hematuria: Secondary | ICD-10-CM | POA: Diagnosis not present

## 2020-09-08 DIAGNOSIS — R8271 Bacteriuria: Secondary | ICD-10-CM | POA: Diagnosis not present

## 2020-09-26 ENCOUNTER — Other Ambulatory Visit: Payer: Self-pay | Admitting: Nurse Practitioner

## 2020-10-14 DIAGNOSIS — Z23 Encounter for immunization: Secondary | ICD-10-CM | POA: Diagnosis not present

## 2020-11-05 ENCOUNTER — Ambulatory Visit (HOSPITAL_COMMUNITY): Admission: RE | Admit: 2020-11-05 | Payer: Medicare Other | Source: Ambulatory Visit

## 2020-11-05 ENCOUNTER — Other Ambulatory Visit: Payer: Self-pay | Admitting: Nurse Practitioner

## 2020-11-14 ENCOUNTER — Encounter: Payer: Self-pay | Admitting: Cardiology

## 2020-11-14 DIAGNOSIS — C801 Malignant (primary) neoplasm, unspecified: Secondary | ICD-10-CM | POA: Diagnosis not present

## 2020-11-14 DIAGNOSIS — I10 Essential (primary) hypertension: Secondary | ICD-10-CM | POA: Diagnosis not present

## 2020-11-14 DIAGNOSIS — E782 Mixed hyperlipidemia: Secondary | ICD-10-CM | POA: Diagnosis not present

## 2020-11-14 DIAGNOSIS — I4891 Unspecified atrial fibrillation: Secondary | ICD-10-CM | POA: Diagnosis not present

## 2020-11-14 DIAGNOSIS — E1165 Type 2 diabetes mellitus with hyperglycemia: Secondary | ICD-10-CM | POA: Diagnosis not present

## 2020-11-18 DIAGNOSIS — E7849 Other hyperlipidemia: Secondary | ICD-10-CM | POA: Diagnosis not present

## 2020-11-18 DIAGNOSIS — R059 Cough, unspecified: Secondary | ICD-10-CM | POA: Diagnosis not present

## 2020-11-18 DIAGNOSIS — M25472 Effusion, left ankle: Secondary | ICD-10-CM | POA: Diagnosis not present

## 2020-11-18 DIAGNOSIS — R233 Spontaneous ecchymoses: Secondary | ICD-10-CM | POA: Diagnosis not present

## 2020-11-18 DIAGNOSIS — I10 Essential (primary) hypertension: Secondary | ICD-10-CM | POA: Diagnosis not present

## 2020-11-18 DIAGNOSIS — E1165 Type 2 diabetes mellitus with hyperglycemia: Secondary | ICD-10-CM | POA: Diagnosis not present

## 2020-11-18 DIAGNOSIS — C884 Extranodal marginal zone B-cell lymphoma of mucosa-associated lymphoid tissue [MALT-lymphoma]: Secondary | ICD-10-CM | POA: Diagnosis not present

## 2020-12-01 ENCOUNTER — Telehealth: Payer: Self-pay

## 2020-12-01 ENCOUNTER — Other Ambulatory Visit: Payer: Self-pay

## 2020-12-01 ENCOUNTER — Telehealth: Payer: Self-pay | Admitting: Gastroenterology

## 2020-12-01 ENCOUNTER — Encounter: Payer: Self-pay | Admitting: Gastroenterology

## 2020-12-01 ENCOUNTER — Ambulatory Visit: Payer: Medicare Other | Admitting: Gastroenterology

## 2020-12-01 VITALS — BP 124/67 | HR 73 | Temp 97.2°F | Ht 66.0 in | Wt 175.0 lb

## 2020-12-01 DIAGNOSIS — K449 Diaphragmatic hernia without obstruction or gangrene: Secondary | ICD-10-CM | POA: Diagnosis not present

## 2020-12-01 DIAGNOSIS — R110 Nausea: Secondary | ICD-10-CM

## 2020-12-01 DIAGNOSIS — R197 Diarrhea, unspecified: Secondary | ICD-10-CM | POA: Insufficient documentation

## 2020-12-01 DIAGNOSIS — C884 Extranodal marginal zone B-cell lymphoma of mucosa-associated lymphoid tissue [MALT-lymphoma]: Secondary | ICD-10-CM | POA: Diagnosis not present

## 2020-12-01 DIAGNOSIS — R1310 Dysphagia, unspecified: Secondary | ICD-10-CM | POA: Diagnosis not present

## 2020-12-01 DIAGNOSIS — R6881 Early satiety: Secondary | ICD-10-CM

## 2020-12-01 NOTE — Patient Instructions (Signed)
1. Please complete stool studies.  2. Upper endoscopy as scheduled. See separate instructions.

## 2020-12-01 NOTE — Telephone Encounter (Signed)
Left a detailed message for pt. Pt is aware that Dr. Harl Bowie approved of her holding Eliquis 48 hours prior to her procedure. Routing to [facility] clinical.

## 2020-12-01 NOTE — Telephone Encounter (Signed)
PA for EGD/DIL submitted via [facility] website. PA# [id number], valid 01/14/21-04/14/21.

## 2020-12-01 NOTE — Telephone Encounter (Signed)
Ok to hold eliquis  Zandra Abts MD

## 2020-12-01 NOTE — Progress Notes (Signed)
Primary Care Physician: Manon Hilding, MD  Primary Gastroenterologist:  Garfield Cornea, MD   Chief Complaint  Patient presents with  . Dysphagia    Trouble with liquids/solid, pills    HPI: Monica [name] is a 80 y.o. female here for further evaluation of dysphagia.  Patient is on Eliquis for history of A. Fib. Started after last lung surgery in late 2020.  Patient last seen in November 2019.  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  Posttreatment EGD in November 2017 revealed chronic gastritis but no evidence of lymphoid cells.  H. pylori negative.  Last EGD in August 2019 with Schatzki ring status post dilation, large hiatal hernia, focal area about 4 x 4 cm along the greater curvature somewhat erythematous and thickened mucosa with benign biopsies.  Next EGD planned for August 2020 but not completed.  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  She completed colonoscopy January 2020 showed diverticulosis, 3 polyps removed.  Pathology showed tubular adenomas.  No colitis.  No future colonoscopies for surveillance purposes planned due to age.  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  She underwent resection in November 2020 no adjuvant therapy needed.  Chest CT with contrast June 2021: Large hiatal hernia, emphysema, no evidence of local tumor recurrence status post bilateral lower lobectomies, no findings suspicious for metastatic disease in the chest, mildly enlarged left hilar node stable and presumably benign.  Today patient complains of difficulty swallowing.  She has a history of Schatzki rings in the past as well as large hiatal hernia.  Symptoms have been occurring for about a year.  She feels like the right side of her neck  is full/thick, uncomfortable with palpation. Early satiety, can eat only half of what she used to.  Meats and breads are hard to swallow. Large pills are difficult to swallow. No heartburn. Nausea with eating, happens with eating about 1/2 the usual amount. Some lower abdominal pain discomfort. Diarrhea for 3 weeks. May happen every couple days. All stools are loose. No solid stools. Started metamucil last week, stool no longer watery, still very soft to mushy. Twice has had to get up at night to have BM. No melena, brbpr. Pepto for few days didn't help. No recent antibiotics or medication changes. No ill contacts.   Current Outpatient Medications  Medication Sig Dispense Refill  . acetaminophen (TYLENOL) 325 MG tablet Take 650 mg by mouth every 6 (six) hours as needed for moderate pain or headache.     . citalopram (CELEXA) 20 MG tablet Take 20 mg by mouth daily.     Marland Kitchen ELIQUIS 5 MG TABS tablet TAKE 1 TABLET BY MOUTH  TWICE DAILY 180 tablet 3  . losartan (COZAAR) 50 MG tablet TAKE 1 TABLET BY MOUTH  DAILY 90 tablet 3  . metFORMIN (GLUCOPHAGE-XR) 500 MG 24 hr tablet Take 500 mg by mouth in the morning and at bedtime.    . metoprolol tartrate (LOPRESSOR) 25 MG tablet TAKE 1 TABLET BY MOUTH TWICE A DAY 180 tablet 0  . Multiple Vitamin (MULTIVITAMIN WITH MINERALS) TABS tablet Take 1 tablet by mouth daily.    . pioglitazone (ACTOS) 15 MG tablet Take 15 mg by mouth daily.    Marland Kitchen  Polyvinyl Alcohol-Povidone PF 1.4-0.6 % SOLN Place 1 drop into both eyes as needed (Dry eyes).     . pravastatin (PRAVACHOL) 40 MG tablet Take 40 mg by mouth at bedtime.     . lidocaine-prilocaine (EMLA) cream Apply topically as needed. (Patient not taking: No sig reported)     No current facility-administered medications for this visit.    Allergies as of 12/01/2020 - Review Complete 12/01/2020  Allergen Reaction Noted  . Elemental sulfur Hives 06/04/2016   Past Medical History:  Diagnosis Date  . Cancer (HCC)    NHL, Malt  Lymphoma  . Depression   . Diverticulitis 04/28/2020  . 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 Dysphagia   . Dysrhythmia    History of palpatations  . GERD (gastroesophageal reflux disease)    INGESTION   . Heart palpitations   . Hiatal hernia   . Hyperlipidemia   . Interstitial cystitis   . Iron deficiency 04/28/2020  . Lung cancer, lower lobe ([place]) 07/21/2018   Left lower lobe, stage Ia squamous cell carcinoma  . 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 Sinusitis    Past Surgical History:  Procedure Laterality Date  . ABDOMINAL HYSTERECTOMY    . BIOPSY  09/01/2016   Procedure: BIOPSY;  Surgeon: Daneil Dolin, MD;  [facility];  Service: Endoscopy;;  gastric  . BIOPSY  11/29/2018   Procedure: BIOPSY;  Surgeon: Daneil Dolin, MD;  [facility];  Service: Endoscopy;;  right colon  . BLADDER SURGERY    . CATARACT EXTRACTION W/ INTRAOCULAR LENS  IMPLANT, BILATERAL  2015  . CATARACT EXTRACTION, BILATERAL    . COLONOSCOPY     Dr. Lindalou Hose 2009: Normal per PCP notes  . COLONOSCOPY N/A 11/29/2018   [place]: Diverticulosis, 3 polyps removed.  No evidence of colitis.  Tubular adenomas.  No future surveillance colonoscopies recommended due to age.  . ESOPHAGOGASTRODUODENOSCOPY     RMR: Prominant Schzgzki ring/component of peptic stricture status post dilation and disruption as described above, otherwise norma esophagus, moderate-sized hiatal hernia, antal pyloric channel, and posterier bulbar erosions, otherwise unremarkable stomach, D1 and D2 . Inflammatory findings on the stomach and duodenum will likely be related to aspirin effect. We need to rule out Helicobacter pylor  . ESOPHAGOGASTRODUODENOSCOPY N/A 03/10/2015   Dr. 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 Pathology with lymphoid population of stomach, slight atypia  . ESOPHAGOGASTRODUODENOSCOPY N/A 02/03/2016   Dr. Gala Romney:  Schatzki ring noted at GE junction, dilated with 7 and then 13 [place] dilator. Large hiatal hernia. 6 x 7 cm nodular geographically ulcerated mucosa, biopsy c/w MALToma  . ESOPHAGOGASTRODUODENOSCOPY N/A 04/08/2016   Dr. Gala Romney: moderate Schatzki's ring/web s/p dilation, large hiatal hernia, localized area of gastric lymphoma visualized and appeared to be much improved. normal second portion of the duodenum. No specimens collected. Esophageal lumen notably tighted up significantly since her dilation in April of this year.   . ESOPHAGOGASTRODUODENOSCOPY N/A 08/04/2016   Procedure: ESOPHAGOGASTRODUODENOSCOPY (EGD);  Surgeon: Daneil Dolin, MD;  [facility];  Service: Endoscopy;  Laterality: N/A;  7:30 am  . ESOPHAGOGASTRODUODENOSCOPY N/A 09/01/2016   Procedure: ESOPHAGOGASTRODUODENOSCOPY (EGD);  Surgeon: Daneil Dolin, MD;  [facility];  Service: Endoscopy;  Laterality: N/A;  830   . ESOPHAGOGASTRODUODENOSCOPY N/A 05/10/2017   Dr. Gala Romney: Moderate Schatzki ring at the GE junction, status  post dilation with 67 [place].  Medium sized hiatal hernia.  Few localized erosions in the gastric antrum.  Stomach biopsy showed chronic gastritis, no H. pylori.  No atypical lymphoid infiltrates or other features of lymphoproliferative process  . ESOPHAGOGASTRODUODENOSCOPY N/A 06/13/2018   Dr. Gala Romney: Schatzki ring status post dilation.  Large hiatal hernia.  Focal area 4 x 4 cm along the greater curvature, somewhat erythematous and thickened mucosa, biopsy benign.  Next EGD in August 2020.  Marland Kitchen EYE SURGERY    . INTERCOSTAL NERVE BLOCK Right 09/10/2019   Procedure: Intercostal Nerve Block;  Surgeon: [name], MD;  Location: [place];  Service: Thoracic;  Laterality: Right;  . LOBECTOMY Left 05/19/2018   Procedure: LEFT LOWER LOBECTOMY;  Surgeon: [name], MD;  Location: [place];  Service: Thoracic;  Laterality: Left;  . LYMPH NODE DISSECTION Right 09/10/2019   Procedure:  Lymph Node Dissection;  Surgeon: [name], MD;  Location: [place];  Service: Thoracic;  Laterality: Right;  . MALONEY DILATION N/A 03/10/2015   Procedure: Venia Minks DILATION;  Surgeon: Daneil Dolin, MD;  [facility];  Service: Endoscopy;  Laterality: N/A;  . Venia Minks DILATION N/A 02/03/2016   Procedure: Venia Minks DILATION;  Surgeon: Daneil Dolin, MD;  [facility];  Service: Endoscopy;  Laterality: N/A;  . Venia Minks DILATION N/A 04/08/2016   Procedure: Venia Minks DILATION;  Surgeon: Daneil Dolin, MD;  [facility];  Service: Endoscopy;  Laterality: N/A;  . Venia Minks DILATION N/A 05/10/2017   Procedure: Venia Minks DILATION;  Surgeon: Daneil Dolin, MD;  [facility];  Service: Endoscopy;  Laterality: N/A;  . Venia Minks DILATION N/A 06/13/2018   Procedure: Venia Minks DILATION;  Surgeon: Daneil Dolin, MD;  [facility];  Service: Endoscopy;  Laterality: N/A;  . POLYPECTOMY  11/29/2018   Procedure: POLYPECTOMY;  Surgeon: Daneil Dolin, MD;  [facility];  Service: Endoscopy;;  . VIDEO ASSISTED THORACOSCOPY (VATS)/ LOBECTOMY Right 09/10/2019   Procedure: VIDEO ASSISTED THORACOSCOPY (VATS)/RIGHT LOWER LOBECTOMY;  Surgeon: [name], MD;  Location: [place];  Service: Thoracic;  Laterality: Right;  Marland Kitchen VIDEO ASSISTED THORACOSCOPY (VATS)/WEDGE RESECTION Left 05/19/2018   Procedure: VIDEO ASSISTED THORACOSCOPY (VATS)/WEDGE RESECTION;  Surgeon: [name], MD;  Location: [facility];  Service: Thoracic;  Laterality: Left;   Family History  Problem Relation Age of Onset  . Heart attack Father   . Dementia Father   . Diabetes Sister   . Diabetes Brother   . Diabetes Sister   . Colon cancer Neg Hx    Social History   Tobacco Use  . Smoking status: Current Some Day Smoker    Packs/day: 0.50    Years: 57.00    Pack years: 28.50    Types: Cigarettes    Last attempt to quit: 09/24/2019    Years since quitting: 1.1  . Smokeless  tobacco: Never Used  . Tobacco comment: trying to quit, wearing patch  Vaping Use  . Vaping Use: Never used  Substance Use Topics  . Alcohol use: No    Alcohol/week: 0.0 standard drinks  . Drug use: No    ROS:  General: Negative for anorexia, weight loss, fever, chills, fatigue, weakness. ENT: Negative for hoarseness, difficulty swallowing , nasal congestion. CV: Negative for chest pain, angina, palpitations, dyspnea on exertion, peripheral edema.  Respiratory: Negative for dyspnea at rest, positive chronic dyspnea on exertion, positive cough, sputum, wheezing.  GI: See history of  present illness. GU:  Negative for dysuria, hematuria, urinary incontinence, urinary frequency, nocturnal urination.  Endo: Negative for unusual weight change.    Physical Examination:   BP 124/67   Pulse 73   Temp (!) 97.2 F (36.2 C)   Ht 5\' 6"  (1.676 m)   Wt 175 lb (79.4 kg)   BMI 28.25 kg/m   General: Well-nourished, well-developed in no acute distress.  Eyes: No icterus. Neck: Area of concern with no evidence of obvious mass but just above that on the right side below the mandible/lateral upper neck query palpable lymph node (nontender) Mouth: masked Lungs: Scattered wheezes Heart: Regular rate and rhythm, no murmurs rubs or gallops.  Abdomen: Bowel sounds are normal,  nondistended, no hepatosplenomegaly or masses, no abdominal bruits or hernia , no rebound or guarding.  Mild lower abdominal discomfort Extremities: No lower extremity edema. No clubbing or deformities. Neuro: Alert and oriented x 4   Skin: Warm and dry, no jaundice.   Psych: Alert and cooperative, normal mood and affect.  Labs:  Lab Results  Component Value Date   IRON 88 07/23/2020   TIBC 372 07/23/2020   FERRITIN 22 07/23/2020   Lab Results  Component Value Date   CREATININE 0.74 07/23/2020   BUN 8 07/23/2020   NA 137 07/23/2020   K 4.3 07/23/2020   CL 99 07/23/2020   CO2 26 07/23/2020   Lab Results   Component Value Date   WBC 5.8 07/23/2020   HGB 12.6 07/23/2020   HCT 39.2 07/23/2020   MCV 92.0 07/23/2020   PLT 275 07/23/2020   Lab Results  Component Value Date   ALT 17 07/23/2020   AST 20 07/23/2020   ALKPHOS 62 07/23/2020   BILITOT 0.7 07/23/2020   Lab Results  Component Value Date   TSH 2.661 04/22/2020   Lab Results  Component Value Date   VITAMINB12 2,955 (H) 04/22/2020   Lab Results  Component Value Date   FOLATE 38.3 04/22/2020     Imaging Studies: No results found.  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 fib on Eliquis, history of Schatzki ring status post dilation multiple times presenting for further evaluation of dysphagia and 3-week history of diarrhea.   Dysphagia: History of multiple EGDs with dilation of Schatzki rings.  Symptoms currently for 1 year, with difficulty swallowing solid foods and large pills.  Complains of fullness/tenderness in the right upper anterior neck.  At location of discomfort, I do not appreciate any palpable abnormalities just above this query lymph node which is nontender.  Question significance.  Patient has upcoming office visit with Dr. Delton Coombes, recommend for her to address concerns with him.  Not likely contributing to her dysphagia symptoms.  Recommend EGD with esophageal dilation with Dr. Gala Romney in the near future.  Plan for conscious sedation.  ASA III. we will hold Eliquis 48 hours prior to procedure.  Patient requested that I make sure Dr. Harl Bowie is aware of plans to hold Eliquis and obtain his approval.  We have sent request to Dr. Harl Bowie today.  I have discussed the risks, alternatives, benefits with regards to but not limited to the risk of reaction to medication, bleeding, infection, perforation and the patient is agreeable to proceed. Written consent to be  obtained.  MALT lymphoma: Diagnosed in April 2017.  Treated with radiation.  Posttreatment EGD November 2017  revealed chronic gastritis but no evidence of lymphoid cells.  H. pylori negative.  Last EGD August 2019 with Schatzki ring status post dilation, large hiatal hernia, focal area about 4 x 4 cm along the greater curvature, somewhat erythematous and thickened mucosa with benign biopsies.  She was due for follow-up EGD in August 2020 but this was postponed in the setting of new lung cancer.  Plan for EGD in the near future as outlined.  Early satiety/nausea: History of MALT lymphoma and large hiatal hernia.  EGD is scheduled.  Diarrhea: 3 weeks duration.  Denies any recent antibiotic exposure or medication changes.  No change in dietary intake.  No ill contacts.  Last colonoscopy in 2020 as outlined.  Check stool studies for C. difficile GDH, pathogen panel.  Continue Metamucil.

## 2020-12-01 NOTE — Telephone Encounter (Signed)
Plans for upper endoscopy in the near future with possible esophageal dilation and gastric biopsies.  Discussed with patient, need to hold Eliquis 48 hours prior to procedure.  She requested that Dr. Harl Bowie be made aware and we obtain approval for her to hold Eliquis.  We will send request to Dr. Harl Bowie.

## 2020-12-01 NOTE — Telephone Encounter (Signed)
Please let pt know Dr. Harl Bowie approved our recommendations to hold Eliquis for upcoming EGD.

## 2020-12-02 ENCOUNTER — Other Ambulatory Visit (HOSPITAL_COMMUNITY): Payer: Self-pay

## 2020-12-02 DIAGNOSIS — C3432 Malignant neoplasm of lower lobe, left bronchus or lung: Secondary | ICD-10-CM

## 2020-12-02 DIAGNOSIS — E611 Iron deficiency: Secondary | ICD-10-CM

## 2020-12-02 NOTE — Telephone Encounter (Signed)
Already noted on procedure instructions for her to hold Eliquis.

## 2020-12-03 ENCOUNTER — Inpatient Hospital Stay (HOSPITAL_COMMUNITY): Payer: Medicare Other | Attending: Hematology

## 2020-12-03 ENCOUNTER — Ambulatory Visit (HOSPITAL_COMMUNITY)
Admission: RE | Admit: 2020-12-03 | Discharge: 2020-12-03 | Disposition: A | Discharge status: Home / Self Care | Payer: Medicare Other | Source: Ambulatory Visit | Attending: Nurse Practitioner | Admitting: Nurse Practitioner

## 2020-12-03 ENCOUNTER — Other Ambulatory Visit: Payer: Self-pay

## 2020-12-03 ENCOUNTER — Other Ambulatory Visit (HOSPITAL_COMMUNITY): Payer: Medicare Other

## 2020-12-03 DIAGNOSIS — C3432 Malignant neoplasm of lower lobe, left bronchus or lung: Secondary | ICD-10-CM | POA: Diagnosis not present

## 2020-12-03 DIAGNOSIS — F17210 Nicotine dependence, cigarettes, uncomplicated: Secondary | ICD-10-CM | POA: Insufficient documentation

## 2020-12-03 DIAGNOSIS — Z923 Personal history of irradiation: Secondary | ICD-10-CM | POA: Insufficient documentation

## 2020-12-03 DIAGNOSIS — Z90710 Acquired absence of both cervix and uterus: Secondary | ICD-10-CM | POA: Insufficient documentation

## 2020-12-03 DIAGNOSIS — Z8572 Personal history of non-Hodgkin lymphomas: Secondary | ICD-10-CM | POA: Insufficient documentation

## 2020-12-03 DIAGNOSIS — R0602 Shortness of breath: Secondary | ICD-10-CM | POA: Insufficient documentation

## 2020-12-03 DIAGNOSIS — Z902 Acquired absence of lung [part of]: Secondary | ICD-10-CM | POA: Diagnosis not present

## 2020-12-03 DIAGNOSIS — K449 Diaphragmatic hernia without obstruction or gangrene: Secondary | ICD-10-CM | POA: Diagnosis not present

## 2020-12-03 DIAGNOSIS — Z85118 Personal history of other malignant neoplasm of bronchus and lung: Secondary | ICD-10-CM | POA: Diagnosis not present

## 2020-12-03 DIAGNOSIS — E611 Iron deficiency: Secondary | ICD-10-CM

## 2020-12-03 DIAGNOSIS — C3491 Malignant neoplasm of unspecified part of right bronchus or lung: Secondary | ICD-10-CM | POA: Diagnosis not present

## 2020-12-03 DIAGNOSIS — J90 Pleural effusion, not elsewhere classified: Secondary | ICD-10-CM | POA: Diagnosis not present

## 2020-12-03 DIAGNOSIS — I2510 Atherosclerotic heart disease of native coronary artery without angina pectoris: Secondary | ICD-10-CM | POA: Diagnosis not present

## 2020-12-03 DIAGNOSIS — E559 Vitamin D deficiency, unspecified: Secondary | ICD-10-CM | POA: Insufficient documentation

## 2020-12-03 DIAGNOSIS — Z862 Personal history of diseases of the blood and blood-forming organs and certain disorders involving the immune mechanism: Secondary | ICD-10-CM | POA: Diagnosis not present

## 2020-12-03 LAB — CBC WITH DIFFERENTIAL/PLATELET
Abs Immature Granulocytes: 0.02 10*3/uL (ref 0.00–0.07)
Basophils Absolute: 0 10*3/uL (ref 0.0–0.1)
Basophils Relative: 1 %
Eosinophils Absolute: 0.1 10*3/uL (ref 0.0–0.5)
Eosinophils Relative: 2 %
HCT: 39.5 % (ref 36.0–46.0)
Hemoglobin: 12.8 g/dL (ref 12.0–15.0)
Immature Granulocytes: 0 %
Lymphocytes Relative: 26 %
Lymphs Abs: 1.8 10*3/uL (ref 0.7–4.0)
MCH: 29.7 pg (ref 26.0–34.0)
MCHC: 32.4 g/dL (ref 30.0–36.0)
MCV: 91.6 fL (ref 80.0–100.0)
Monocytes Absolute: 0.5 10*3/uL (ref 0.1–1.0)
Monocytes Relative: 8 %
Neutro Abs: 4.2 10*3/uL (ref 1.7–7.7)
Neutrophils Relative %: 63 %
Platelets: 261 10*3/uL (ref 150–400)
RBC: 4.31 MIL/uL (ref 3.87–5.11)
RDW: 13.8 % (ref 11.5–15.5)
WBC: 6.7 10*3/uL (ref 4.0–10.5)
nRBC: 0 % (ref 0.0–0.2)

## 2020-12-03 LAB — COMPREHENSIVE METABOLIC PANEL
ALT: 14 U/L (ref 0–44)
AST: 19 U/L (ref 15–41)
Albumin: 3.8 g/dL (ref 3.5–5.0)
Alkaline Phosphatase: 62 U/L (ref 38–126)
Anion gap: 9 (ref 5–15)
BUN: 13 mg/dL (ref 8–23)
CO2: 25 mmol/L (ref 22–32)
Calcium: 9.3 mg/dL (ref 8.9–10.3)
Chloride: 99 mmol/L (ref 98–111)
Creatinine, Ser: 0.74 mg/dL (ref 0.44–1.00)
GFR, Estimated: >60 mL/min (ref >60)
Glucose, Bld: 215 mg/dL — ABNORMAL HIGH (ref 70–99)
Potassium: 3.9 mmol/L (ref 3.5–5.1)
Sodium: 133 mmol/L — ABNORMAL LOW (ref 135–145)
Total Bilirubin: 0.5 mg/dL (ref 0.3–1.2)
Total Protein: 7.3 g/dL (ref 6.5–8.1)

## 2020-12-03 LAB — IRON AND TIBC
Iron: 78 ug/dL (ref 28–170)
Saturation Ratios: 20 % (ref 10.4–31.8)
TIBC: 384 ug/dL (ref 250–450)
UIBC: 306 ug/dL

## 2020-12-03 LAB — VITAMIN B12: Vitamin B-12: 1310 pg/mL — ABNORMAL HIGH (ref 180–914)

## 2020-12-03 LAB — VITAMIN D 25 HYDROXY (VIT D DEFICIENCY, FRACTURES): Vit D, 25-Hydroxy: 27.53 ng/mL — ABNORMAL LOW (ref 30–100)

## 2020-12-03 LAB — FERRITIN: Ferritin: 22 ng/mL (ref 11–307)

## 2020-12-03 LAB — LACTATE DEHYDROGENASE: LDH: 105 U/L (ref 98–192)

## 2020-12-03 LAB — FOLATE: Folate: 17.2 ng/mL (ref >5.9)

## 2020-12-03 MED ORDER — IOHEXOL 300 MG/ML  SOLN
75.0000 mL | Freq: Once | INTRAMUSCULAR | Status: AC | PRN
  Administered 2020-12-03: 75 mL via INTRAVENOUS

## 2020-12-10 ENCOUNTER — Other Ambulatory Visit: Payer: Self-pay

## 2020-12-10 ENCOUNTER — Inpatient Hospital Stay (HOSPITAL_COMMUNITY): Payer: Medicare Other | Admitting: Hematology

## 2020-12-10 VITALS — BP 143/56 | HR 69 | Temp 97.7°F | Resp 20 | Wt 175.8 lb

## 2020-12-10 DIAGNOSIS — Z8572 Personal history of non-Hodgkin lymphomas: Secondary | ICD-10-CM | POA: Diagnosis not present

## 2020-12-10 DIAGNOSIS — Z85118 Personal history of other malignant neoplasm of bronchus and lung: Secondary | ICD-10-CM | POA: Diagnosis not present

## 2020-12-10 DIAGNOSIS — C3432 Malignant neoplasm of lower lobe, left bronchus or lung: Secondary | ICD-10-CM | POA: Diagnosis not present

## 2020-12-10 DIAGNOSIS — R0602 Shortness of breath: Secondary | ICD-10-CM | POA: Diagnosis not present

## 2020-12-10 DIAGNOSIS — E559 Vitamin D deficiency, unspecified: Secondary | ICD-10-CM | POA: Diagnosis not present

## 2020-12-10 DIAGNOSIS — C3431 Malignant neoplasm of lower lobe, right bronchus or lung: Secondary | ICD-10-CM

## 2020-12-10 DIAGNOSIS — Z902 Acquired absence of lung [part of]: Secondary | ICD-10-CM | POA: Diagnosis not present

## 2020-12-10 DIAGNOSIS — Z862 Personal history of diseases of the blood and blood-forming organs and certain disorders involving the immune mechanism: Secondary | ICD-10-CM | POA: Diagnosis not present

## 2020-12-10 DIAGNOSIS — F17210 Nicotine dependence, cigarettes, uncomplicated: Secondary | ICD-10-CM | POA: Diagnosis not present

## 2020-12-10 DIAGNOSIS — Z923 Personal history of irradiation: Secondary | ICD-10-CM | POA: Diagnosis not present

## 2020-12-10 NOTE — Progress Notes (Signed)
[place] [name], [place] [address]   CLINIC:  Medical Oncology/Hematology  PCP:  Manon Hilding, MD [address] [place] [address] [phone]   REASON FOR VISIT:  Follow-up for left and right lung cancer  PRIOR THERAPY:  1. Left lower lobectomy on 05/19/2018. 2. VATS with right lower lobectomy on 09/10/2019.   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 Gala Romney, abnormal gastric mucosa. Pathology with EXTRANODAL Marginal zone lymphoma. NEGATIVE for H. Pylori   03/03/2016 Miscellaneous   H pylori stool antigen NEGATIVE   03/03/2016 PET scan   Focal area of hypermetabolism and wall thickening involving the body antral junction region of the stomach c/w history of lymphoma. 5.5 mm LLL pulm nodule not hypermetabolic. Non contrast chest CT in 6 month   03/18/2016 - 04/08/2016 Radiation Therapy   The gastric tumor received 30 Gy in 15 fractions of 2 Gy   Lung cancer ([place])  05/19/2018 Initial Diagnosis   Lung cancer ([place])   06/06/2018 Cancer Staging   Staging form: Lung, AJCC 8th Edition - Clinical: cT1b, cN0 - Signed by Zoila Shutter, MD on 06/06/2018     CANCER STAGING: Cancer Staging Lung cancer ([place]) Staging form: Lung, AJCC 8th Edition - Clinical: cT1b, cN0 - Signed by Zoila Shutter, MD on 06/06/2018  MALToma [facility]) Staging form: Lymphoid Neoplasms, AJCC 6th Edition - Clinical stage from 03/23/2016: Stage I - Signed by Baird Cancer, PA-C on 03/23/2016   INTERVAL HISTORY:  Ms. Monica [name], a 80 y.o. female, returns for routine follow-up of her left and right lung cancer. Audrianna was last seen by Francene Finders, NP, on 07/30/2020.   Today she is accompanied by her husband and she reports feeling okay. She continues having SOB since her VATS and it is stable. She is taking a multi-vitamin  daily which includes vitamin D and iron. She took iron gummies which turned her tongue black. She denies having any F/C, night sweats or weight loss.   REVIEW OF SYSTEMS:  Review of Systems  Constitutional: Positive for appetite change (50%) and fatigue (25%). Negative for chills, diaphoresis, fever and unexpected weight change.  Respiratory: Positive for shortness of breath (stable).   Gastrointestinal: Positive for diarrhea.  All other systems reviewed and are negative.   PAST MEDICAL/SURGICAL HISTORY:  Past Medical History:  Diagnosis Date  . Cancer (HCC)    NHL, Malt Lymphoma  . Depression   . Diverticulitis 04/28/2020  . 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 Dysphagia   . Dysrhythmia    History of palpatations  . GERD (gastroesophageal reflux disease)    INGESTION   . Heart palpitations   . Hiatal hernia   . Hyperlipidemia   . Interstitial cystitis   . Iron deficiency 04/28/2020  . Lung cancer, lower lobe ([place]) 07/21/2018   Left lower lobe, stage Ia squamous cell carcinoma  . 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 Sinusitis    Past Surgical History:  Procedure Laterality Date  . ABDOMINAL HYSTERECTOMY    . BIOPSY  09/01/2016   Procedure: BIOPSY;  Surgeon: Daneil Dolin, MD;  [facility];  Service: Endoscopy;;  gastric  . BIOPSY  11/29/2018   Procedure: BIOPSY;  Surgeon: Gala Romney,  Cristopher Estimable, MD;  [facility];  Service: Endoscopy;;  right colon  . BLADDER SURGERY    . CATARACT EXTRACTION W/ INTRAOCULAR LENS  IMPLANT, BILATERAL  2015  . CATARACT EXTRACTION, BILATERAL    . COLONOSCOPY     Dr. Lindalou Hose 2009: Normal per PCP notes  . COLONOSCOPY N/A 11/29/2018   [place]: Diverticulosis, 3 polyps removed.  No evidence of colitis.  Tubular adenomas.  No future surveillance colonoscopies recommended due to age.  . ESOPHAGOGASTRODUODENOSCOPY     RMR: Prominant Schzgzki ring/component of peptic stricture status post dilation and  disruption as described above, otherwise norma esophagus, moderate-sized hiatal hernia, antal pyloric channel, and posterier bulbar erosions, otherwise unremarkable stomach, D1 and D2 . Inflammatory findings on the stomach and duodenum will likely be related to aspirin effect. We need to rule out Helicobacter pylor  . ESOPHAGOGASTRODUODENOSCOPY N/A 03/10/2015   Dr. 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 Pathology with lymphoid population of stomach, slight atypia  . ESOPHAGOGASTRODUODENOSCOPY N/A 02/03/2016   Dr. Gala Romney: Schatzki ring noted at GE junction, dilated with 23 and then 64 [place] dilator. Large hiatal hernia. 6 x 7 cm nodular geographically ulcerated mucosa, biopsy c/w MALToma  . ESOPHAGOGASTRODUODENOSCOPY N/A 04/08/2016   Dr. Gala Romney: moderate Schatzki's ring/web s/p dilation, large hiatal hernia, localized area of gastric lymphoma visualized and appeared to be much improved. normal second portion of the duodenum. No specimens collected. Esophageal lumen notably tighted up significantly since her dilation in April of this year.   . ESOPHAGOGASTRODUODENOSCOPY N/A 08/04/2016   Procedure: ESOPHAGOGASTRODUODENOSCOPY (EGD);  Surgeon: Daneil Dolin, MD;  [facility];  Service: Endoscopy;  Laterality: N/A;  7:30 am  . ESOPHAGOGASTRODUODENOSCOPY N/A 09/01/2016   Procedure: ESOPHAGOGASTRODUODENOSCOPY (EGD);  Surgeon: Daneil Dolin, MD;  [facility];  Service: Endoscopy;  Laterality: N/A;  830   . ESOPHAGOGASTRODUODENOSCOPY N/A 05/10/2017   Dr. Gala Romney: Moderate Schatzki ring at the GE junction, status post dilation with 28 [place].  Medium sized hiatal hernia.  Few localized erosions in the gastric antrum.  Stomach biopsy showed chronic gastritis, no H. pylori.  No atypical lymphoid infiltrates or other features of lymphoproliferative process  . ESOPHAGOGASTRODUODENOSCOPY N/A 06/13/2018   Dr. Gala Romney: Schatzki  ring status post dilation.  Large hiatal hernia.  Focal area 4 x 4 cm along the greater curvature, somewhat erythematous and thickened mucosa, biopsy benign.  Next EGD in August 2020.  Marland Kitchen EYE SURGERY    . INTERCOSTAL NERVE BLOCK Right 09/10/2019   Procedure: Intercostal Nerve Block;  Surgeon: [name], MD;  Location: [facility];  Service: Thoracic;  Laterality: Right;  . LOBECTOMY Left 05/19/2018   Procedure: LEFT LOWER LOBECTOMY;  Surgeon: [name], MD;  Location: [facility];  Service: Thoracic;  Laterality: Left;  . LYMPH NODE DISSECTION Right 09/10/2019   Procedure: Lymph Node Dissection;  Surgeon: [name], MD;  Location: [facility];  Service: Thoracic;  Laterality: Right;  . MALONEY DILATION N/A 03/10/2015   Procedure: Venia Minks DILATION;  Surgeon: Daneil Dolin, MD;  [facility];  Service: Endoscopy;  Laterality: N/A;  . Venia Minks DILATION N/A 02/03/2016   Procedure: Venia Minks DILATION;  Surgeon: Daneil Dolin, MD;  [facility];  Service: Endoscopy;  Laterality: N/A;  . Venia Minks DILATION N/A 04/08/2016   Procedure: Venia Minks DILATION;  Surgeon: Daneil Dolin, MD;  [facility];  Service: Endoscopy;  Laterality: N/A;  .  MALONEY DILATION N/A 05/10/2017   Procedure: Venia Minks DILATION;  Surgeon: Daneil Dolin, MD;  [facility];  Service: Endoscopy;  Laterality: N/A;  . Venia Minks DILATION N/A 06/13/2018   Procedure: Venia Minks DILATION;  Surgeon: Daneil Dolin, MD;  [facility];  Service: Endoscopy;  Laterality: N/A;  . POLYPECTOMY  11/29/2018   Procedure: POLYPECTOMY;  Surgeon: Daneil Dolin, MD;  [facility];  Service: Endoscopy;;  . VIDEO ASSISTED THORACOSCOPY (VATS)/ LOBECTOMY Right 09/10/2019   Procedure: VIDEO ASSISTED THORACOSCOPY (VATS)/RIGHT LOWER LOBECTOMY;  Surgeon: [name], MD;  Location: [facility];  Service: Thoracic;  Laterality: Right;  Marland Kitchen VIDEO ASSISTED THORACOSCOPY (VATS)/WEDGE RESECTION Left  05/19/2018   Procedure: VIDEO ASSISTED THORACOSCOPY (VATS)/WEDGE RESECTION;  Surgeon: [name], MD;  [facility];  Service: Thoracic;  Laterality: Left;    SOCIAL HISTORY:  Social History   Socioeconomic History  . Marital status: Married    Spouse name: Not on file  . Number of children: Not on file  . Years of education: Not on file  . Highest education level: Not on file  Occupational History  . Not on file  Tobacco Use  . Smoking status: Current Some Day Smoker    Packs/day: 0.50    Years: 57.00    Pack years: 28.50    Types: Cigarettes    Last attempt to quit: 09/24/2019    Years since quitting: 1.2  . Smokeless tobacco: Never Used  . Tobacco comment: trying to quit, wearing patch  Vaping Use  . Vaping Use: Never used  Substance and Sexual Activity  . Alcohol use: No    Alcohol/week: 0.0 standard drinks  . Drug use: No  . Sexual activity: Not on file  Other Topics Concern  . Not on file  Social History Narrative  . Not on file   Social Determinants of Health   Financial Resource Strain: Not on file  Food Insecurity: Not on file  Transportation Needs: Not on file  Physical Activity: Not on file  Stress: Not on file  Social Connections: Not on file  Intimate Partner Violence: Not on file    FAMILY HISTORY:  Family History  Problem Relation Age of Onset  . Heart attack Father   . Dementia Father   . Diabetes Sister   . Diabetes Brother   . Diabetes Sister   . Colon cancer Neg Hx     CURRENT MEDICATIONS:  Current Outpatient Medications  Medication Sig Dispense Refill  . citalopram (CELEXA) 20 MG tablet Take 20 mg by mouth daily.     Marland Kitchen ELIQUIS 5 MG TABS tablet TAKE 1 TABLET BY MOUTH  TWICE DAILY 180 tablet 3  . losartan (COZAAR) 50 MG tablet TAKE 1 TABLET BY MOUTH  DAILY 90 tablet 3  . metFORMIN (GLUCOPHAGE-XR) 500 MG 24 hr tablet Take 500 mg by mouth in the morning and at bedtime.    . metoprolol tartrate (LOPRESSOR) 25 MG tablet TAKE  1 TABLET BY MOUTH TWICE A DAY 180 tablet 0  . Multiple Vitamin (MULTIVITAMIN WITH MINERALS) TABS tablet Take 1 tablet by mouth daily.    . pioglitazone (ACTOS) 15 MG tablet Take 15 mg by mouth daily.    . Polyvinyl Alcohol-Povidone PF 1.4-0.6 % SOLN Place 1 drop into both eyes as needed (Dry eyes).     . pravastatin (PRAVACHOL) 40 MG tablet Take 40 mg by mouth at bedtime.     Marland Kitchen acetaminophen (TYLENOL) 325 MG  tablet Take 650 mg by mouth every 6 (six) hours as needed for moderate pain or headache.  (Patient not taking: Reported on 12/10/2020)     No current facility-administered medications for this visit.    ALLERGIES:  Allergies  Allergen Reactions  . Elemental Sulfur Hives    PHYSICAL EXAM:  Performance status (ECOG): 1 - Symptomatic but completely ambulatory  Vitals:   12/10/20 1146  BP: (!) 143/56  Pulse: 69  Resp: 20  Temp: 97.7 F (36.5 C)  SpO2: 97%   Wt Readings from Last 3 Encounters:  12/10/20 175 lb 12.8 oz (79.7 kg)  12/01/20 175 lb (79.4 kg)  07/30/20 175 lb 14.8 oz (79.8 kg)   Physical Exam Vitals reviewed.  Constitutional:      Appearance: Normal appearance.  Neck:     Thyroid: No thyroid mass, thyromegaly or thyroid tenderness.  Cardiovascular:     Rate and Rhythm: Normal rate and regular rhythm.     Pulses: Normal pulses.     Heart sounds: Normal heart sounds.  Pulmonary:     Effort: Pulmonary effort is normal.     Breath sounds: Normal breath sounds.  Chest:  Breasts:     Right: No axillary adenopathy or supraclavicular adenopathy.     Left: No axillary adenopathy or supraclavicular adenopathy.    Abdominal:     Palpations: Abdomen is soft. There is no hepatomegaly, splenomegaly or mass.     Tenderness: There is no abdominal tenderness.     Hernia: No hernia is present.  Lymphadenopathy:     Cervical: No cervical adenopathy.     Upper Body:     Right upper body: No supraclavicular, axillary or pectoral adenopathy.     Left upper body: No  supraclavicular, axillary or pectoral adenopathy.     Lower Body: No right inguinal adenopathy. No left inguinal adenopathy.  Neurological:     General: No focal deficit present.     Mental Status: She is alert and oriented to person, place, and time.  Psychiatric:        Mood and Affect: Mood normal.        Behavior: Behavior normal.      LABORATORY DATA:  I have reviewed the labs as listed.  CBC Latest Ref Rng & Units 12/03/2020 07/23/2020 04/22/2020  WBC 4.0 - 10.5 K/uL 6.7 5.8 5.8  Hemoglobin 12.0 - 15.0 g/dL 12.8 12.6 13.2  Hematocrit 36.0 - 46.0 % 39.5 39.2 41.1  Platelets 150 - 400 K/uL 261 275 273   CMP Latest Ref Rng & Units 12/03/2020 07/23/2020 04/22/2020  Glucose 70 - 99 mg/dL 215(H) 223(H) 152(H)  BUN 8 - 23 mg/dL 13 8 11   Creatinine 0.44 - 1.00 mg/dL 0.74 0.74 0.84  Sodium 135 - 145 mmol/L 133(L) 137 137  Potassium 3.5 - 5.1 mmol/L 3.9 4.3 4.8  Chloride 98 - 111 mmol/L 99 99 98  CO2 22 - 32 mmol/L 25 26 30   Calcium 8.9 - 10.3 mg/dL 9.3 9.6 9.5  Total Protein 6.5 - 8.1 g/dL 7.3 7.4 7.7  Total Bilirubin 0.3 - 1.2 mg/dL 0.5 0.7 0.4  Alkaline Phos 38 - 126 U/L 62 62 61  AST 15 - 41 U/L 19 20 23   ALT 0 - 44 U/L 14 17 19    Lab Results  Component Value Date   LDH 105 12/03/2020   LDH 123 08/13/2019   LDH 130 05/02/2019   Lab Results  Component Value Date   VD25OH 27.53 (L) 12/03/2020  Lab Results  Component Value Date   TIBC 384 12/03/2020   TIBC 372 07/23/2020   TIBC 402 04/22/2020   FERRITIN 22 12/03/2020   FERRITIN 22 07/23/2020   FERRITIN 17 04/22/2020   IRONPCTSAT 20 12/03/2020   IRONPCTSAT 24 07/23/2020   IRONPCTSAT 25 04/22/2020    DIAGNOSTIC IMAGING:  I have independently reviewed the scans and discussed with the patient. CT CHEST W CONTRAST  Result Date: 12/03/2020 CLINICAL DATA:  Status post bilateral lung cancers. Right lower lobectomy 09/10/2019. Left lower lobectomy 05/19/2018. EXAM: CT CHEST WITH CONTRAST TECHNIQUE: Multidetector CT imaging of  the chest was performed during intravenous contrast administration. CONTRAST:  76mL OMNIPAQUE IOHEXOL 300 MG/ML  SOLN COMPARISON:  04/22/2020 FINDINGS: Cardiovascular: Aortic atherosclerosis. Mild cardiomegaly with left atrial enlargement. Multivessel coronary artery atherosclerosis. No central pulmonary embolism, on this non-dedicated study. Pulmonary artery enlargement, outflow tract 3.1 cm. Mediastinum/Nodes: Subcentimeter left thyroid nodule is unchanged. Not clinically significant; no follow-up imaging recommended (ref: J Am Coll Radiol. 2015 Feb;12(2): 143-50).Borderline enlarged 11 mm left hilar node is unchanged. No mediastinal adenopathy. Large hiatal hernia, with greater than 1/2 of the stomach positioned in the chest. Lungs/Pleura: Trace right pleural fluid, nearly completely resolved. Status post bilateral lower lobectomies. Moderate centrilobular and paraseptal emphysema. No suspicious pulmonary nodule or mass. Upper Abdomen: Probable hepatic steatosis. Normal imaged portions of the spleen, pancreas, gallbladder, adrenal glands, kidneys. Musculoskeletal: No acute osseous abnormality. IMPRESSION: 1. Status post bilateral lower lobectomies, without recurrent or metastatic disease. 2. Borderline to mildly enlarged left hilar node, stable over multiple priors and likely reactive. 3. Aortic atherosclerosis (ICD10-I70.0), coronary artery atherosclerosis and emphysema (ICD10-J43.9). 4. Large hiatal hernia. 5. Newly completely resolved tiny right pleural effusion. 6. Pulmonary artery enlargement suggests pulmonary arterial hypertension. Electronically Signed   By: Abigail Miyamoto M.D.   On: 12/03/2020 15:01     ASSESSMENT:  1.  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 -Adjuvant  therapy was not recommended.  2.  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   3.  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  H. pylori negative. -XRT 30 Gray in 15 fractions from 03/18/2016 through 04/18/2016. -EGD biopsy on 06/13/2018 shows mild chronic gastritis and small lymphoid aggregates with no features of lymphoma. -PET scan on 08/13/2019 did not show any abnormal uptake associated with abdominal organs.  No lymphadenopathy. -Colonoscopy on 11/29/2018 showed diverticulosis in the sigmoid colon with 2 subcentimeter polyps in sigmoid colon.    PLAN:  1.  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 -We will recommend a CT scan in 6 months.  2.  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  3.  Gastric marginal zone lymphoma: -She does not have any B symptoms.  No palpable adenopathy.  LDH is normal.  CBC and LFTs are normal.  4.  Low vitamin D levels: -She was told to take vitamin D daily.    Orders placed this encounter:  Orders Placed This Encounter  Procedures  . CT Chest W Contrast  .  CBC with Differential/Platelet  . Comprehensive metabolic panel  . Ferritin  . Iron and TIBC  . VITAMIN D 25 Hydroxy (Vit-D Deficiency, Fractures)     Derek Jack, MD [place] [phone]   I, Milinda Antis, am acting as a scribe for Dr. Sanda Linger.  I, Derek Jack MD, have reviewed the above documentation for accuracy and completeness, and I agree with the above.

## 2020-12-10 NOTE — Patient Instructions (Signed)
[place] at [facility] Discharge Instructions  You were seen today by Dr. Delton Coombes. He went over your recent results and scans. Purchase vitamin D over the counter and take 1,000 units daily. Take 1 iron tablet daily along with your multi-vitamin; if you develop constipation, take 1 stool softener daily as well.  You will be scheduled for a CT scan of your chest before your next visit. Dr. Delton Coombes will see you back in 6 months for labs and follow up.   Thank you for choosing [place] at [facility] to provide your oncology and hematology care.  To afford each patient quality time with our provider, please arrive at least 15 minutes before your scheduled appointment time.   If you have a lab appointment with the [facility] please come in thru the Main Entrance and check in at the main information desk  You need to re-schedule your appointment should you arrive 10 or more minutes late.  We strive to give you quality time with our providers, and arriving late affects you and other patients whose appointments are after yours.  Also, if you no show three or more times for appointments you may be dismissed from the clinic at the providers discretion.     Again, thank you for choosing [facility].  Our hope is that these requests will decrease the amount of time that you wait before being seen by our physicians.       _____________________________________________________________  Should you have questions after your visit to [facility], please contact our office at [phone] between the hours of 8:00 a.m. and 4:30 p.m.  Voicemails left after 4:00 p.m. will not be returned until the following business day.  For prescription refill requests, have your pharmacy contact our office and allow 72 hours.    Cancer Center Support Programs:   > Cancer Support Group  2nd Tuesday of the month 1pm-2pm, Journey Room

## 2020-12-11 ENCOUNTER — Telehealth: Payer: Self-pay | Admitting: *Deleted

## 2020-12-11 NOTE — Telephone Encounter (Signed)
Pt has had moderna vaccine 1st dose 12-06-2019, 2nd vaccine 01-04-2020 and moderna booster on 10-14-2020

## 2020-12-12 DIAGNOSIS — K449 Diaphragmatic hernia without obstruction or gangrene: Secondary | ICD-10-CM | POA: Diagnosis not present

## 2020-12-12 DIAGNOSIS — R197 Diarrhea, unspecified: Secondary | ICD-10-CM | POA: Diagnosis not present

## 2020-12-12 DIAGNOSIS — C884 Extranodal marginal zone B-cell lymphoma of mucosa-associated lymphoid tissue [MALT-lymphoma]: Secondary | ICD-10-CM | POA: Diagnosis not present

## 2020-12-12 DIAGNOSIS — R1310 Dysphagia, unspecified: Secondary | ICD-10-CM | POA: Diagnosis not present

## 2020-12-12 DIAGNOSIS — R110 Nausea: Secondary | ICD-10-CM | POA: Diagnosis not present

## 2020-12-15 LAB — GASTROINTESTINAL PATHOGEN PANEL PCR
C. difficile Tox A/B, PCR: NOT DETECTED
Campylobacter, PCR: NOT DETECTED
Cryptosporidium, PCR: NOT DETECTED
E coli (ETEC) LT/ST PCR: NOT DETECTED
E coli (STEC) stx1/stx2, PCR: NOT DETECTED
E coli 0157, PCR: NOT DETECTED
Giardia lamblia, PCR: NOT DETECTED
Norovirus, PCR: NOT DETECTED
Rotavirus A, PCR: NOT DETECTED
Salmonella, PCR: NOT DETECTED
Shigella, PCR: NOT DETECTED

## 2020-12-15 LAB — C. DIFFICILE GDH AND TOXIN A/B
GDH ANTIGEN: NOT DETECTED
MICRO NUMBER:: 11525940
SPECIMEN QUALITY:: ADEQUATE
TOXIN A AND B: NOT DETECTED

## 2020-12-22 ENCOUNTER — Encounter: Payer: Self-pay | Admitting: *Deleted

## 2020-12-22 ENCOUNTER — Encounter: Payer: Self-pay | Admitting: Cardiology

## 2020-12-22 ENCOUNTER — Ambulatory Visit: Payer: Medicare Other | Admitting: Cardiology

## 2020-12-22 VITALS — BP 130/84 | HR 62 | Ht 66.0 in | Wt 172.8 lb

## 2020-12-22 DIAGNOSIS — E782 Mixed hyperlipidemia: Secondary | ICD-10-CM

## 2020-12-22 DIAGNOSIS — I10 Essential (primary) hypertension: Secondary | ICD-10-CM | POA: Diagnosis not present

## 2020-12-22 DIAGNOSIS — I480 Paroxysmal atrial fibrillation: Secondary | ICD-10-CM

## 2020-12-22 NOTE — Patient Instructions (Signed)

## 2020-12-22 NOTE — Progress Notes (Signed)
Clinical Summary Monica [name] is a 80 y.o.female  1. PAF - has not tolerated amio in the past   - rare palpitations, infrequent - compliant with meds - no bleeding on eliquis.    2. History of lung cancer - s/p resection  3. Hyperlipidemia - compliant with meds  4. HTN - home bp's 120s/70s  SH:  Completed covid vaccine x3.   Past Medical History:  Diagnosis Date  . Cancer (HCC)    NHL, Malt Lymphoma  . Depression   . Diverticulitis 04/28/2020  . 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 Dysphagia   . Dysrhythmia    History of palpatations  . GERD (gastroesophageal reflux disease)    INGESTION   . Heart palpitations   . Hiatal hernia   . Hyperlipidemia   . Interstitial cystitis   . Iron deficiency 04/28/2020  . Lung cancer, lower lobe ([place]) 07/21/2018   Left lower lobe, stage Ia squamous cell carcinoma  . 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 Sinusitis      Allergies  Allergen Reactions  . Elemental Sulfur Hives     Current Outpatient Medications  Medication Sig Dispense Refill  . acetaminophen (TYLENOL) 325 MG tablet Take 650 mg by mouth every 6 (six) hours as needed for moderate pain or headache.  (Patient not taking: Reported on 12/10/2020)    . citalopram (CELEXA) 20 MG tablet Take 20 mg by mouth daily.     Marland Kitchen ELIQUIS 5 MG TABS tablet TAKE 1 TABLET BY MOUTH  TWICE DAILY 180 tablet 3  . losartan (COZAAR) 50 MG tablet TAKE 1 TABLET BY MOUTH  DAILY 90 tablet 3  . metFORMIN (GLUCOPHAGE-XR) 500 MG 24 hr tablet Take 500 mg by mouth in the morning and at bedtime.    . metoprolol tartrate (LOPRESSOR) 25 MG tablet TAKE 1 TABLET BY MOUTH TWICE A DAY 180 tablet 0  . Multiple Vitamin (MULTIVITAMIN WITH MINERALS) TABS tablet Take 1 tablet by mouth daily.    . pioglitazone (ACTOS) 15 MG tablet Take 15 mg by mouth daily.    . Polyvinyl Alcohol-Povidone PF 1.4-0.6 % SOLN Place 1 drop into both eyes as needed (Dry eyes).     .  pravastatin (PRAVACHOL) 40 MG tablet Take 40 mg by mouth at bedtime.      No current facility-administered medications for this visit.     Past Surgical History:  Procedure Laterality Date  . ABDOMINAL HYSTERECTOMY    . BIOPSY  09/01/2016   Procedure: BIOPSY;  Surgeon: Daneil Dolin, MD;  [facility];  Service: Endoscopy;;  gastric  . BIOPSY  11/29/2018   Procedure: BIOPSY;  Surgeon: Daneil Dolin, MD;  [facility];  Service: Endoscopy;;  right colon  . BLADDER SURGERY    . CATARACT EXTRACTION W/ INTRAOCULAR LENS  IMPLANT, BILATERAL  2015  . CATARACT EXTRACTION, BILATERAL    . COLONOSCOPY     Dr. Lindalou Hose 2009: Normal per PCP notes  . COLONOSCOPY N/A 11/29/2018   [place]: Diverticulosis, 3 polyps removed.  No evidence of colitis.  Tubular adenomas.  No future surveillance colonoscopies recommended due to age.  . ESOPHAGOGASTRODUODENOSCOPY     RMR: Prominant Schzgzki ring/component of peptic stricture status post dilation and disruption as described above, otherwise norma esophagus, moderate-sized hiatal hernia, antal pyloric channel, and posterier bulbar erosions, otherwise unremarkable stomach, D1 and D2 . Inflammatory findings on the  stomach and duodenum will likely be related to aspirin effect. We need to rule out Helicobacter pylor  . ESOPHAGOGASTRODUODENOSCOPY N/A 03/10/2015   Dr. 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 Pathology with lymphoid population of stomach, slight atypia  . ESOPHAGOGASTRODUODENOSCOPY N/A 02/03/2016   Dr. Gala Romney: Schatzki ring noted at GE junction, dilated with 46 and then 53 [place] dilator. Large hiatal hernia. 6 x 7 cm nodular geographically ulcerated mucosa, biopsy c/w MALToma  . ESOPHAGOGASTRODUODENOSCOPY N/A 04/08/2016   Dr. Gala Romney: moderate Schatzki's ring/web s/p dilation, large hiatal hernia, localized area of gastric lymphoma visualized and appeared to be much  improved. normal second portion of the duodenum. No specimens collected. Esophageal lumen notably tighted up significantly since her dilation in April of this year.   . ESOPHAGOGASTRODUODENOSCOPY N/A 08/04/2016   Procedure: ESOPHAGOGASTRODUODENOSCOPY (EGD);  Surgeon: Daneil Dolin, MD;  [facility];  Service: Endoscopy;  Laterality: N/A;  7:30 am  . ESOPHAGOGASTRODUODENOSCOPY N/A 09/01/2016   Procedure: ESOPHAGOGASTRODUODENOSCOPY (EGD);  Surgeon: Daneil Dolin, MD;  [facility];  Service: Endoscopy;  Laterality: N/A;  830   . ESOPHAGOGASTRODUODENOSCOPY N/A 05/10/2017   Dr. Gala Romney: Moderate Schatzki ring at the GE junction, status post dilation with 71 [place].  Medium sized hiatal hernia.  Few localized erosions in the gastric antrum.  Stomach biopsy showed chronic gastritis, no H. pylori.  No atypical lymphoid infiltrates or other features of lymphoproliferative process  . ESOPHAGOGASTRODUODENOSCOPY N/A 06/13/2018   Dr. Gala Romney: Schatzki ring status post dilation.  Large hiatal hernia.  Focal area 4 x 4 cm along the greater curvature, somewhat erythematous and thickened mucosa, biopsy benign.  Next EGD in August 2020.  Marland Kitchen EYE SURGERY    . INTERCOSTAL NERVE BLOCK Right 09/10/2019   Procedure: Intercostal Nerve Block;  Surgeon: [name], MD;  Location: [facility];  Service: Thoracic;  Laterality: Right;  . LOBECTOMY Left 05/19/2018   Procedure: LEFT LOWER LOBECTOMY;  Surgeon: [name], MD;  Location: [place];  Service: Thoracic;  Laterality: Left;  . LYMPH NODE DISSECTION Right 09/10/2019   Procedure: Lymph Node Dissection;  Surgeon: [name], MD;  Location: [place];  Service: Thoracic;  Laterality: Right;  . MALONEY DILATION N/A 03/10/2015   Procedure: Venia Minks DILATION;  Surgeon: Daneil Dolin, MD;  [facility];  Service: Endoscopy;  Laterality: N/A;  . Venia Minks DILATION N/A 02/03/2016   Procedure: Venia Minks DILATION;  Surgeon: Daneil Dolin, MD;   [facility];  Service: Endoscopy;  Laterality: N/A;  . Venia Minks DILATION N/A 04/08/2016   Procedure: Venia Minks DILATION;  Surgeon: Daneil Dolin, MD;  [facility];  Service: Endoscopy;  Laterality: N/A;  . Venia Minks DILATION N/A 05/10/2017   Procedure: Venia Minks DILATION;  Surgeon: Daneil Dolin, MD;  [facility];  Service: Endoscopy;  Laterality: N/A;  . Venia Minks DILATION N/A 06/13/2018   Procedure: Venia Minks DILATION;  Surgeon: Daneil Dolin, MD;  [facility];  Service: Endoscopy;  Laterality: N/A;  . POLYPECTOMY  11/29/2018   Procedure: POLYPECTOMY;  Surgeon: Daneil Dolin, MD;  [facility];  Service: Endoscopy;;  . VIDEO ASSISTED THORACOSCOPY (VATS)/ LOBECTOMY Right 09/10/2019   Procedure: VIDEO ASSISTED THORACOSCOPY (VATS)/RIGHT LOWER LOBECTOMY;  Surgeon: [name], MD;  Location: [place];  Service: Thoracic;  Laterality: Right;  Marland Kitchen VIDEO ASSISTED THORACOSCOPY (VATS)/WEDGE RESECTION Left 05/19/2018   Procedure: VIDEO ASSISTED THORACOSCOPY (  VATS)/WEDGE RESECTION;  Surgeon: [name], MD;  Location: [place];  Service: Thoracic;  Laterality: Left;     Allergies  Allergen Reactions  . Elemental Sulfur Hives      Family History  Problem Relation Age of Onset  . Heart attack Father   . Dementia Father   . Diabetes Sister   . Diabetes Brother   . Diabetes Sister   . Colon cancer Neg Hx      Social History Monica [name] reports that she has been smoking cigarettes. She has a 28.50 pack-year smoking history. She has never used smokeless tobacco. Monica [name] reports no history of alcohol use.   Review of Systems CONSTITUTIONAL: No weight loss, fever, chills, weakness or fatigue.  HEENT: Eyes: No visual loss, blurred vision, double vision or yellow sclerae.No hearing loss, sneezing, congestion, runny nose or sore throat.  SKIN: No rash or itching.  CARDIOVASCULAR: per hpi RESPIRATORY: No shortness of breath, cough or  sputum.  GASTROINTESTINAL: No anorexia, nausea, vomiting or diarrhea. No abdominal pain or blood.  GENITOURINARY: No burning on urination, no polyuria NEUROLOGICAL: No headache, dizziness, syncope, paralysis, ataxia, numbness or tingling in the extremities. No change in bowel or bladder control.  MUSCULOSKELETAL: No muscle, back pain, joint pain or stiffness.  LYMPHATICS: No enlarged nodes. No history of splenectomy.  PSYCHIATRIC: No history of depression or anxiety.  ENDOCRINOLOGIC: No reports of sweating, cold or heat intolerance. No polyuria or polydipsia.  Marland Kitchen   Physical Examination Today's Vitals   12/22/20 0806  BP: 130/84  Pulse: 62  SpO2: 98%  Weight: 172 lb 12.8 oz (78.4 kg)  Height: 5\' 6"  (1.676 m)   Body mass index is 27.89 kg/m.  Gen: resting comfortably, no acute distress HEENT: no scleral icterus, pupils equal round and reactive, no palptable cervical adenopathy,  CV: RRR, no mr/g, no jvd Resp: Clear to auscultation bilaterally GI: abdomen is soft, non-tender, non-distended, normal bowel sounds, no hepatosplenomegaly MSK: extremities are warm, no edema.  Skin: warm, no rash Neuro:  no focal deficits Psych: appropriate affect   Diagnostic Studies ECHO 2019: - Left ventricle: The cavity size was normal. Wall thickness was increased in a pattern of moderate LVH. Systolic function was vigorous. The estimated ejection fraction was in the range of 65% to 70%. Wall motion was normal; there were no regional wall motion abnormalities. Features are consistent with a pseudonormal left ventricular filling pattern, with concomitant abnormal relaxation and increased filling pressure (grade 2 diastolic dysfunction). - Aortic valve: Mildly calcified annulus. Trileaflet. - Mitral valve: There was mild regurgitation. - Left atrium: The atrium was moderately dilated. - Right atrium: Central venous pressure (est): 3 mm Hg. - Atrial septum: No defect or patent  foramen ovale was identified. - Tricuspid valve: There was trivial regurgitation. - Pulmonary arteries: Systolic pressure could not be accurately estimated. - Pericardium, extracardiac: There was no pericardial effusion    Assessment and Plan  1. PAF - no significant symptoms, continue current meds - EKG today shows NSR  2. HTN - at goal, continue current meds  3. Hyperlipidemia -continue statin, request pcp labs.      Arnoldo Lenis, M.D.

## 2021-01-12 ENCOUNTER — Other Ambulatory Visit: Payer: Self-pay

## 2021-01-12 ENCOUNTER — Other Ambulatory Visit (HOSPITAL_COMMUNITY)
Admission: RE | Admit: 2021-01-12 | Discharge: 2021-01-12 | Disposition: A | Discharge status: Home / Self Care | Payer: Medicare Other | Source: Ambulatory Visit | Attending: Internal Medicine | Admitting: Internal Medicine

## 2021-01-12 DIAGNOSIS — Z01812 Encounter for preprocedural laboratory examination: Secondary | ICD-10-CM | POA: Insufficient documentation

## 2021-01-12 DIAGNOSIS — Z20822 Contact with and (suspected) exposure to covid-19: Secondary | ICD-10-CM | POA: Insufficient documentation

## 2021-01-12 LAB — SARS CORONAVIRUS 2 (TAT 6-24 HRS): SARS Coronavirus 2: NEGATIVE

## 2021-01-14 ENCOUNTER — Encounter (HOSPITAL_COMMUNITY): Payer: Self-pay | Admitting: Internal Medicine

## 2021-01-14 ENCOUNTER — Encounter (HOSPITAL_COMMUNITY)
Admission: RE | Disposition: A | Discharge status: Home / Self Care | Payer: Self-pay | Source: Home / Self Care | Attending: Internal Medicine

## 2021-01-14 ENCOUNTER — Other Ambulatory Visit: Payer: Self-pay

## 2021-01-14 ENCOUNTER — Telehealth: Payer: Self-pay | Admitting: Internal Medicine

## 2021-01-14 ENCOUNTER — Ambulatory Visit (HOSPITAL_COMMUNITY)
Admission: RE | Admit: 2021-01-14 | Discharge: 2021-01-14 | Disposition: A | Discharge status: Home / Self Care | Payer: Medicare Other | Attending: Internal Medicine | Admitting: Internal Medicine

## 2021-01-14 DIAGNOSIS — Z833 Family history of diabetes mellitus: Secondary | ICD-10-CM | POA: Diagnosis not present

## 2021-01-14 DIAGNOSIS — K222 Esophageal obstruction: Secondary | ICD-10-CM | POA: Insufficient documentation

## 2021-01-14 DIAGNOSIS — Z8249 Family history of ischemic heart disease and other diseases of the circulatory system: Secondary | ICD-10-CM | POA: Diagnosis not present

## 2021-01-14 DIAGNOSIS — Z85118 Personal history of other malignant neoplasm of bronchus and lung: Secondary | ICD-10-CM | POA: Diagnosis not present

## 2021-01-14 DIAGNOSIS — K449 Diaphragmatic hernia without obstruction or gangrene: Secondary | ICD-10-CM | POA: Diagnosis not present

## 2021-01-14 DIAGNOSIS — Z7901 Long term (current) use of anticoagulants: Secondary | ICD-10-CM | POA: Insufficient documentation

## 2021-01-14 DIAGNOSIS — Z79899 Other long term (current) drug therapy: Secondary | ICD-10-CM | POA: Diagnosis not present

## 2021-01-14 DIAGNOSIS — Z7984 Long term (current) use of oral hypoglycemic drugs: Secondary | ICD-10-CM | POA: Insufficient documentation

## 2021-01-14 DIAGNOSIS — R1314 Dysphagia, pharyngoesophageal phase: Secondary | ICD-10-CM | POA: Insufficient documentation

## 2021-01-14 DIAGNOSIS — Z8572 Personal history of non-Hodgkin lymphomas: Secondary | ICD-10-CM | POA: Diagnosis not present

## 2021-01-14 DIAGNOSIS — R1310 Dysphagia, unspecified: Secondary | ICD-10-CM

## 2021-01-14 DIAGNOSIS — E119 Type 2 diabetes mellitus without complications: Secondary | ICD-10-CM | POA: Diagnosis not present

## 2021-01-14 DIAGNOSIS — F17210 Nicotine dependence, cigarettes, uncomplicated: Secondary | ICD-10-CM | POA: Insufficient documentation

## 2021-01-14 HISTORY — PX: MALONEY DILATION: SHX5535

## 2021-01-14 HISTORY — PX: ESOPHAGOGASTRODUODENOSCOPY: SHX5428

## 2021-01-14 LAB — GLUCOSE, CAPILLARY: Glucose-Capillary: 152 mg/dL — ABNORMAL HIGH (ref 70–99)

## 2021-01-14 SURGERY — EGD (ESOPHAGOGASTRODUODENOSCOPY)
Anesthesia: Moderate Sedation

## 2021-01-14 MED ORDER — LIDOCAINE VISCOUS HCL 2 % MT SOLN
OROMUCOSAL | Status: DC | PRN
  Administered 2021-01-14: 1 via OROMUCOSAL

## 2021-01-14 MED ORDER — LIDOCAINE VISCOUS HCL 2 % MT SOLN
OROMUCOSAL | Status: AC
  Filled 2021-01-14: qty 15

## 2021-01-14 MED ORDER — ONDANSETRON HCL 4 MG/2ML IJ SOLN
INTRAMUSCULAR | Status: DC | PRN
  Administered 2021-01-14: 4 mg via INTRAVENOUS

## 2021-01-14 MED ORDER — PANTOPRAZOLE SODIUM 40 MG PO TBEC: 40.0000 mg | DELAYED_RELEASE_TABLET | Freq: Every day | ORAL | 3 refills | Status: DC

## 2021-01-14 MED ORDER — ONDANSETRON HCL 4 MG/2ML IJ SOLN
INTRAMUSCULAR | Status: AC
  Filled 2021-01-14: qty 2

## 2021-01-14 MED ORDER — MEPERIDINE HCL 50 MG/ML IJ SOLN
INTRAMUSCULAR | Status: AC
  Filled 2021-01-14: qty 1

## 2021-01-14 MED ORDER — MIDAZOLAM HCL 5 MG/5ML IJ SOLN
INTRAMUSCULAR | Status: DC | PRN
  Administered 2021-01-14 (×2): 1 mg via INTRAVENOUS
  Administered 2021-01-14: 2 mg via INTRAVENOUS

## 2021-01-14 MED ORDER — STERILE WATER FOR IRRIGATION IR SOLN
Status: DC | PRN
  Administered 2021-01-14: 100 mL

## 2021-01-14 MED ORDER — SODIUM CHLORIDE 0.9 % IV SOLN
INTRAVENOUS | Status: DC
  Administered 2021-01-14: 1000 mL via INTRAVENOUS

## 2021-01-14 MED ORDER — MEPERIDINE HCL 100 MG/ML IJ SOLN
INTRAMUSCULAR | Status: DC | PRN
  Administered 2021-01-14: 15 mg
  Administered 2021-01-14: 25 mg

## 2021-01-14 MED ORDER — MIDAZOLAM HCL 5 MG/5ML IJ SOLN
INTRAMUSCULAR | Status: AC
  Filled 2021-01-14: qty 10

## 2021-01-14 NOTE — Telephone Encounter (Signed)
Medication sent into pts pharmacy ok per Dr. Gala Romney

## 2021-01-14 NOTE — Op Note (Signed)
[facility] Patient Name: Monica [name] Procedure Date: 01/14/2021 8:17 AM MRN: [id number] Date of Birth: 10-20-1941 Attending MD: Norvel Richards , MD CSN: [id number] Age: 80 Admit Type: Outpatient Procedure:                Upper GI endoscopy Indications:              Dysphagia Providers:                Norvel Richards, MD, Lambert Mody, Casimer Bilis, Technician Referring MD:              Medicines:                Midazolam 4 mg IV, Meperidine 40 mg IV Complications:            No immediate complications. Estimated Blood Loss:     Estimated blood loss was minimal. Procedure:                Pre-Anesthesia Assessment:                           - Prior to the procedure, a History and Physical                            was performed, and patient medications and                            allergies were reviewed. The patient's tolerance of                            previous anesthesia was also reviewed. The risks                            and benefits of the procedure and the sedation                            options and risks were discussed with the patient.                            All questions were answered, and informed consent                            was obtained. Prior Anticoagulants: The patient                            last took Eliquis (apixaban) 3 days prior to the                            procedure. ASA Grade Assessment: II - A patient                            with mild systemic disease. After reviewing the  risks and benefits, the patient was deemed in                            satisfactory condition to undergo the procedure.                           After obtaining informed consent, the endoscope was                            passed under direct vision. Throughout the                            procedure, the patient's blood pressure, pulse, and                             oxygen saturations were monitored continuously. The                            [id number]-H190 ([id number]) scope was introduced through the                            mouth, and advanced to the second part of duodenum.                            The upper GI endoscopy was accomplished without                            difficulty. The patient tolerated the procedure                            well. Scope In: 8:41:34 AM Scope Out: 8:49:56 AM Total Procedure Duration: 0 hours 8 minutes 22 seconds  Findings:      A moderate Schatzki ring was found at the gastroesophageal junction. No       nodularity. No esophagitis. No Barrett's epithelium seen.      A large hiatal hernia was present. Perhaps, half the stomach is above       the diaphragm. The mucosa of the stomach appeared otherwise normal.       There was no evidence of lymphoma or other abnormality seen today.      The duodenal bulb and second portion of the duodenum were normal. The       scope was withdrawn. Dilation was performed with a Maloney dilator with       mild resistance at 56 Fr. The dilation site was examined following       endoscope reinsertion and showed moderate mucosal disruption. Estimated       blood loss was minimal. Impression:               - Moderate Schatzki ring. Dilated.                           - Large hiatal hernia.                           - Normal duodenal bulb and second portion of the  duodenum.                           - No specimens collected. Moderate Sedation:      Moderate (conscious) sedation was administered by the endoscopy nurse       and supervised by the endoscopist. The following parameters were       monitored: oxygen saturation, heart rate, blood pressure, respiratory       rate, EKG, adequacy of pulmonary ventilation, and response to care.       Total physician intraservice time was 14 minutes. Recommendation:           - Patient has a contact number available for                             emergencies. The signs and symptoms of potential                            delayed complications were discussed with the                            patient. Return to normal activities tomorrow.                            Written discharge instructions were provided to the                            patient.                           - Resume previous diet.                           - Continue present medications. Begin Protonix 40                            mg daily.                           - Return to my office in 2 weeks. Further                            evaluation of her new recent complaint of diarrhea                            will be undertaken in the office. Resume Eliquis                            today. Procedure Code(s):        --- Professional ---                           [phone], Esophagogastroduodenoscopy, flexible,                            transoral; diagnostic, including collection of  specimen(s) by brushing or washing, when performed                            (separate procedure)                           43450, Dilation of esophagus, by unguided sound or                            bougie, single or multiple passes                           G0500, Moderate sedation services provided by the                            same physician or other qualified health care                            professional performing a gastrointestinal                            endoscopic service that sedation supports,                            requiring the presence of an independent trained                            observer to assist in the monitoring of the                            patient's level of consciousness and physiological                            status; initial 15 minutes of intra-service time;                            patient age 38 years or older (additional time may                            be reported with  [phone], as appropriate) Diagnosis Code(s):        --- Professional ---                           K22.2, Esophageal obstruction                           K44.9, Diaphragmatic hernia without obstruction or                            gangrene                           R13.10, Dysphagia, unspecified CPT copyright 2019 American Medical Association. All rights reserved. The codes documented in this report are preliminary and upon coder review may  be revised to meet current compliance requirements. Cristopher Estimable.  Kameran Mcneese, MD Norvel Richards, MD 01/14/2021 8:58:52 AM This report has been signed electronically. Number of Addenda: 0

## 2021-01-14 NOTE — Telephone Encounter (Signed)
Pt called to ask for Dr Gala Romney to send her Protonix prescription to [facility] as a 90 day supply.

## 2021-01-14 NOTE — Telephone Encounter (Signed)
She can have a 90-day supply with 3 refills.  Take Protonix 40 mg once daily.

## 2021-01-14 NOTE — H&P (Signed)
@LOGO @   Primary Care Physician:  Manon Hilding, MD Primary Gastroenterologist:  Dr. Gala Romney  Pre-Procedure History & Physical: HPI:  Monica [name] is a 80 y.o. female here for further evaluation of recurrent esophageal dysphagia [place] known Schatzki's ring and hiatal hernia.  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  Patient also relates nonbloody diarrhea x2 to 3 months.  C. difficile and GIP negative Past Medical History:  Diagnosis Date  . Cancer (HCC)    NHL, Malt Lymphoma  . Depression   . Diverticulitis 04/28/2020  . 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 Dysphagia   . Dysrhythmia    History of palpatations  . GERD (gastroesophageal reflux disease)    INGESTION   . Heart palpitations   . Hiatal hernia   . Hyperlipidemia   . Interstitial cystitis   . Iron deficiency 04/28/2020  . Lung cancer, lower lobe ([place]) 07/21/2018   Left lower lobe, stage Ia squamous cell carcinoma  . 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 Sinusitis     Past Surgical History:  Procedure Laterality Date  . ABDOMINAL HYSTERECTOMY    . BIOPSY  09/01/2016   Procedure: BIOPSY;  Surgeon: Daneil Dolin, MD;  [facility];  Service: Endoscopy;;  gastric  . BIOPSY  11/29/2018   Procedure: BIOPSY;  Surgeon: Daneil Dolin, MD;  [facility];  Service: Endoscopy;;  right colon  . BLADDER SURGERY    . CATARACT EXTRACTION W/ INTRAOCULAR LENS  IMPLANT, BILATERAL  2015  . CATARACT EXTRACTION, BILATERAL    . COLONOSCOPY     Dr. Lindalou Hose 2009: Normal per PCP notes  . COLONOSCOPY N/A 11/29/2018   [place]k: Diverticulosis, 3 polyps removed.  No evidence of colitis.  Tubular adenomas.  No future surveillance colonoscopies recommended due to age.  . ESOPHAGOGASTRODUODENOSCOPY     RMR: Prominant Schzgzki ring/component of peptic stricture status post dilation and disruption as described above, otherwise norma esophagus, moderate-sized hiatal hernia, antal  pyloric channel, and posterier bulbar erosions, otherwise unremarkable stomach, D1 and D2 . Inflammatory findings on the stomach and duodenum will likely be related to aspirin effect. We need to rule out Helicobacter pylor  . ESOPHAGOGASTRODUODENOSCOPY N/A 03/10/2015   Dr. 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 Pathology with lymphoid population of stomach, slight atypia  . ESOPHAGOGASTRODUODENOSCOPY N/A 02/03/2016   Dr. Gala Romney: Schatzki ring noted at GE junction, dilated with 85 and then 81 [place] dilator. Large hiatal hernia. 6 x 7 cm nodular geographically ulcerated mucosa, biopsy c/w MALToma  . ESOPHAGOGASTRODUODENOSCOPY N/A 04/08/2016   Dr. Gala Romney: moderate Schatzki's ring/web s/p dilation, large hiatal hernia, localized area of gastric lymphoma visualized and appeared to be much improved. normal second portion of the duodenum. No specimens collected. Esophageal lumen notably tighted up significantly since her dilation in April of this year.   . ESOPHAGOGASTRODUODENOSCOPY N/A 08/04/2016   Procedure: ESOPHAGOGASTRODUODENOSCOPY (EGD);  Surgeon: Daneil Dolin, MD;  [facility];  Service: Endoscopy;  Laterality: N/A;  7:30 am  . ESOPHAGOGASTRODUODENOSCOPY N/A 09/01/2016   Procedure: ESOPHAGOGASTRODUODENOSCOPY (EGD);  Surgeon: Daneil Dolin, MD;  [facility];  Service: Endoscopy;  Laterality: N/A;  830   . ESOPHAGOGASTRODUODENOSCOPY N/A 05/10/2017   Dr. Gala Romney: Moderate Schatzki ring at the GE junction, status post dilation with 83 [place].  Medium sized hiatal hernia.  Few localized erosions in  the gastric antrum.  Stomach biopsy showed chronic gastritis, no H. pylori.  No atypical lymphoid infiltrates or other features of lymphoproliferative process  . ESOPHAGOGASTRODUODENOSCOPY N/A 06/13/2018   Dr. Gala Romney: Schatzki ring status post dilation.  Large hiatal hernia.  Focal area 4 x 4 cm along the greater  curvature, somewhat erythematous and thickened mucosa, biopsy benign.  Next EGD in August 2020.  Marland Kitchen EYE SURGERY    . INTERCOSTAL NERVE BLOCK Right 09/10/2019   Procedure: Intercostal Nerve Block;  Surgeon: [name], MD;  Location: [place];  Service: Thoracic;  Laterality: Right;  . LOBECTOMY Left 05/19/2018   Procedure: LEFT LOWER LOBECTOMY;  Surgeon: [name], MD;  Location: [place];  Service: Thoracic;  Laterality: Left;  . LYMPH NODE DISSECTION Right 09/10/2019   Procedure: Lymph Node Dissection;  Surgeon: [name], MD;  Location: [place];  Service: Thoracic;  Laterality: Right;  . MALONEY DILATION N/A 03/10/2015   Procedure: Venia Minks DILATION;  Surgeon: Daneil Dolin, MD;  [facility];  Service: Endoscopy;  Laterality: N/A;  . Venia Minks DILATION N/A 02/03/2016   Procedure: Venia Minks DILATION;  Surgeon: Daneil Dolin, MD;  [facility];  Service: Endoscopy;  Laterality: N/A;  . Venia Minks DILATION N/A 04/08/2016   Procedure: Venia Minks DILATION;  Surgeon: Daneil Dolin, MD;  [facility];  Service: Endoscopy;  Laterality: N/A;  . Venia Minks DILATION N/A 05/10/2017   Procedure: Venia Minks DILATION;  Surgeon: Daneil Dolin, MD;  [facility];  Service: Endoscopy;  Laterality: N/A;  . Venia Minks DILATION N/A 06/13/2018   Procedure: Venia Minks DILATION;  Surgeon: Daneil Dolin, MD;  [facility];  Service: Endoscopy;  Laterality: N/A;  . POLYPECTOMY  11/29/2018   Procedure: POLYPECTOMY;  Surgeon: Daneil Dolin, MD;  [facility];  Service: Endoscopy;;  . VIDEO ASSISTED THORACOSCOPY (VATS)/ LOBECTOMY Right 09/10/2019   Procedure: VIDEO ASSISTED THORACOSCOPY (VATS)/RIGHT LOWER LOBECTOMY;  Surgeon: [name], MD;  Location: [place];  Service: Thoracic;  Laterality: Right;  Marland Kitchen VIDEO ASSISTED THORACOSCOPY (VATS)/WEDGE RESECTION Left 05/19/2018   Procedure: VIDEO ASSISTED THORACOSCOPY (VATS)/WEDGE RESECTION;  Surgeon:  [name], MD;  Location: [place];  Service: Thoracic;  Laterality: Left;    Prior to Admission medications   Medication Sig Start Date End Date Taking? Authorizing Provider  acetaminophen (TYLENOL) 325 MG tablet Take 650 mg by mouth every 6 (six) hours as needed for moderate pain or headache.   Yes [provider]  citalopram (CELEXA) 20 MG tablet Take 20 mg by mouth daily.  12/10/14  Yes [provider]  ELIQUIS 5 MG TABS tablet TAKE 1 TABLET BY MOUTH  TWICE DAILY Patient taking differently: Take 5 mg by mouth 2 (two) times daily. 09/29/20  Yes Branch, Alphonse Guild, MD  losartan (COZAAR) 50 MG tablet TAKE 1 TABLET BY MOUTH  DAILY Patient taking differently: Take 50 mg by mouth daily. 11/05/20  Yes BranchAlphonse Guild, MD  metFORMIN (GLUCOPHAGE-XR) 500 MG 24 hr tablet Take 500 mg by mouth in the morning and at bedtime. 12/10/14  Yes [provider]  metoprolol tartrate (LOPRESSOR) 25 MG tablet TAKE 1 TABLET BY MOUTH TWICE A DAY Patient taking differently: Take 25 mg by mouth 2 (two) times daily. 12/06/19  Yes Burtis Junes, NP  Multiple Vitamin (MULTIVITAMIN WITH MINERALS) TABS tablet Take 1 tablet by mouth daily.   Yes [provider]  pioglitazone (ACTOS) 15 MG tablet  Take 15 mg by mouth daily.   Yes [provider]  Polyvinyl Alcohol-Povidone PF 1.4-0.6 % SOLN Place 1 drop into both eyes as needed (Dry eyes).    Yes [provider]  pravastatin (PRAVACHOL) 40 MG tablet Take 40 mg by mouth at bedtime.  07/12/16  Yes [provider]    Allergies as of 12/01/2020 - Review Complete 12/01/2020  Allergen Reaction Noted  . Elemental sulfur Hives 06/04/2016    Family History  Problem Relation Age of Onset  . Heart attack Father   . Dementia Father   . Diabetes Sister   . Diabetes Brother   . Diabetes Sister   . Colon cancer Neg Hx     Social History   Socioeconomic History  . Marital status: Married    Spouse name: Not  on file  . Number of children: Not on file  . Years of education: Not on file  . Highest education level: Not on file  Occupational History  . Not on file  Tobacco Use  . Smoking status: Current Some Day Smoker    Packs/day: 0.50    Years: 57.00    Pack years: 28.50    Types: Cigarettes    Last attempt to quit: 09/24/2019    Years since quitting: 1.3  . Smokeless tobacco: Never Used  . Tobacco comment: trying to quit, wearing patch  Vaping Use  . Vaping Use: Never used  Substance and Sexual Activity  . Alcohol use: No    Alcohol/week: 0.0 standard drinks  . Drug use: No  . Sexual activity: Not on file  Other Topics Concern  . Not on file  Social History Narrative  . Not on file   Social Determinants of Health   Financial Resource Strain: Not on file  Food Insecurity: Not on file  Transportation Needs: Not on file  Physical Activity: Not on file  Stress: Not on file  Social Connections: Not on file  Intimate Partner Violence: Not on file    Review of Systems: See HPI, otherwise negative ROS  Physical Exam: BP (!) 175/65   Pulse (!) 56   Temp 98.1 F (36.7 C) (Oral)   Resp 14   Ht 5\' 6"  (1.676 m)   Wt 78 kg   SpO2 97%   BMI 27.76 kg/m  General:   Alert,  Well-developed, well-nourished, pleasant and cooperative in NAD Neck:  Supple; no masses or thyromegaly. No significant cervical adenopathy. Lungs:  Clear throughout to auscultation.   No wheezes, crackles, or rhonchi. No acute distress. Heart:  Regular rate and rhythm; no murmurs, clicks, rubs,  or gallops. Abdomen: Non-distended, normal bowel sounds.  Soft and nontender without appreciable mass or hepatosplenomegaly.  Pulses:  Normal pulses noted. Extremities:  Without clubbing or edema.  Impression/Plan: 80 year old lady with a recurrent esophageal dysphagia in the setting of a known Schatzki's ring and large hiatal hernia.  No acid suppression therapy  I have offered the patient an EGD with possible  esophageal dilation today as feasible/appropriate per plan.  The risks, benefits, limitations, alternatives and imponderables have been reviewed with the patient. Potential for esophageal dilation, biopsy, etc. have also been reviewed.  Questions have been answered. All parties agreeable.  Eliquis held 3/13.  Nonbloody watery diarrhea is likely a separate issue will be addressed with office follow-up.  Further recommendations to follow.     Notice: This dictation was prepared with Dragon dictation along with smaller phrase technology. Any transcriptional errors that result  from this process are unintentional and may not be corrected upon review.

## 2021-01-14 NOTE — Discharge Instructions (Signed)
EGD Discharge instructions Please read the instructions outlined below and refer to this sheet in the next few weeks. These discharge instructions provide you with general information on caring for yourself after you leave the hospital. Your doctor may also give you specific instructions. While your treatment has been planned according to the most current medical practices available, unavoidable complications occasionally occur. If you have any problems or questions after discharge, please call your doctor. ACTIVITY  You may resume your regular activity but move at a slower pace for the next 24 hours.   Take frequent rest periods for the next 24 hours.   Walking will help expel (get rid of) the air and reduce the bloated feeling in your abdomen.   No driving for 24 hours (because of the anesthesia (medicine) used during the test).   You may shower.   Do not sign any important legal documents or operate any machinery for 24 hours (because of the anesthesia used during the test).  NUTRITION  Drink plenty of fluids.   You may resume your normal diet.   Begin with a light meal and progress to your normal diet.   Avoid alcoholic beverages for 24 hours or as instructed by your caregiver.  MEDICATIONS  You may resume your normal medications unless your caregiver tells you otherwise.  WHAT YOU CAN EXPECT TODAY  You may experience abdominal discomfort such as a feeling of fullness or "gas" pains.  FOLLOW-UP  Your doctor will discuss the results of your test with you.  SEEK IMMEDIATE MEDICAL ATTENTION IF ANY OF THE FOLLOWING OCCUR:  Excessive nausea (feeling sick to your stomach) and/or vomiting.   Severe abdominal pain and distention (swelling).   Trouble swallowing.   Temperature over 101 F (37.8 C).   Rectal bleeding or vomiting of blood.    Your esophagus was dilated today.  You have a large hiatal hernia.  You need to be on a medication for reflux every day  Begin  Protonix 40 mg daily  Appointment with Neil Crouch in 2 to 3 weeks regarding diarrhea  Hiatal Hernia  A hiatal hernia occurs when part of the stomach slides above the muscle that separates the abdomen from the chest (diaphragm). A person can be born with a hiatal hernia (congenital), or it may develop over time. In almost all cases of hiatal hernia, only the top part of the stomach pushes through the diaphragm. Many people have a hiatal hernia with no symptoms. The larger the hernia, the more likely it is that you will have symptoms. In some cases, a hiatal hernia allows stomach acid to flow back into the tube that carries food from your mouth to your stomach (esophagus). This may cause heartburn symptoms. Severe heartburn symptoms may mean that you have developed a condition called gastroesophageal reflux disease (GERD). What are the causes? This condition is caused by a weakness in the opening (hiatus) where the esophagus passes through the diaphragm to attach to the upper part of the stomach. A person may be born with a weakness in the hiatus, or a weakness can develop over time. What increases the risk? This condition is more likely to develop in:  Older people. Age is a major risk factor for a hiatal hernia, especially if you are over the age of 58.  Pregnant women.  People who are overweight.  People who have frequent constipation. What are the signs or symptoms? Symptoms of this condition usually develop in the form of GERD symptoms. Symptoms  include:  Heartburn.  Belching.  Indigestion.  Trouble swallowing.  Coughing or wheezing.  Sore throat.  Hoarseness.  Chest pain.  Nausea and vomiting. How is this diagnosed? This condition may be diagnosed during testing for GERD. Tests that may be done include:  X-rays of your stomach or chest.  An upper gastrointestinal (GI) series. This is an X-ray exam of your GI tract that is taken after you swallow a chalky liquid that  shows up clearly on the X-ray.  Endoscopy. This is a procedure to look into your stomach using a thin, flexible tube that has a tiny camera and light on the end of it. How is this treated? This condition may be treated by:  Dietary and lifestyle changes to help reduce GERD symptoms.  Medicines. These may include: ? Over-the-counter antacids. ? Medicines that make your stomach empty more quickly. ? Medicines that block the production of stomach acid (H2 blockers). ? Stronger medicines to reduce stomach acid (proton pump inhibitors).  Surgery to repair the hernia, if other treatments are not helping. If you have no symptoms, you may not need treatment. Follow these instructions at home: Lifestyle and activity  Do not use any products that contain nicotine or tobacco, such as cigarettes and e-cigarettes. If you need help quitting, ask your health care provider.  Try to achieve and maintain a healthy body weight.  Avoid putting pressure on your abdomen. Anything that puts pressure on your abdomen increases the amount of acid that may be pushed up into your esophagus. ? Avoid bending over, especially after eating. ? Raise the head of your bed by putting blocks under the legs. This keeps your head and esophagus higher than your stomach. ? Do not wear tight clothing around your chest or stomach. ? Try not to strain when having a bowel movement, when urinating, or when lifting heavy objects. Eating and drinking  Avoid foods that can worsen GERD symptoms. These may include: ? Fatty foods, like fried foods. ? Citrus fruits, like oranges or lemon. ? Other foods and drinks that contain acid, like orange juice or tomatoes. ? Spicy food. ? Chocolate.  Eat frequent small meals instead of three large meals a day. This helps prevent your stomach from getting too full. ? Eat slowly. ? Do not lie down right after eating. ? Do not eat 1-2 hours before bed.  Do not drink beverages with caffeine.  These include cola, coffee, cocoa, and tea.  Do not drink alcohol. General instructions  Take over-the-counter and prescription medicines only as told by your health care provider.  Keep all follow-up visits as told by your health care provider. This is important. Contact a health care provider if:  Your symptoms are not controlled with medicines or lifestyle changes.  You are having trouble swallowing.  You have coughing or wheezing that will not go away. Get help right away if:  Your pain is getting worse.  Your pain spreads to your arms, neck, jaw, teeth, or back.  You have shortness of breath.  You sweat for no reason.  You feel sick to your stomach (nauseous) or you vomit.  You vomit blood.  You have bright red blood in your stools.  You have black, tarry stools. This information is not intended to replace advice given to you by your health care provider. Make sure you discuss any questions you have with your health care provider. Document Revised: 09/30/2017 Document Reviewed: 05/23/2017 Elsevier Patient Education  [place]  Eliquis today.  At patient request, I called Deola Rewis at [phone] -left voicemail

## 2021-01-14 NOTE — Telephone Encounter (Signed)
Routing to [facility] refill box 

## 2021-01-21 ENCOUNTER — Encounter (HOSPITAL_COMMUNITY): Payer: Self-pay | Admitting: Internal Medicine

## 2021-02-24 ENCOUNTER — Other Ambulatory Visit: Payer: Self-pay

## 2021-02-24 ENCOUNTER — Encounter: Payer: Self-pay | Admitting: Internal Medicine

## 2021-02-24 ENCOUNTER — Ambulatory Visit: Payer: Medicare Other | Admitting: Gastroenterology

## 2021-02-24 ENCOUNTER — Encounter: Payer: Self-pay | Admitting: Gastroenterology

## 2021-02-24 VITALS — BP 108/64 | HR 70 | Temp 97.0°F | Ht 66.0 in | Wt 172.6 lb

## 2021-02-24 DIAGNOSIS — R197 Diarrhea, unspecified: Secondary | ICD-10-CM | POA: Diagnosis not present

## 2021-02-24 DIAGNOSIS — K449 Diaphragmatic hernia without obstruction or gangrene: Secondary | ICD-10-CM | POA: Diagnosis not present

## 2021-02-24 DIAGNOSIS — R198 Other specified symptoms and signs involving the digestive system and abdomen: Secondary | ICD-10-CM

## 2021-02-24 NOTE — Progress Notes (Signed)
Primary Care Physician: Manon Hilding, MD  Primary Gastroenterologist:  Garfield Cornea, MD   Chief Complaint  Patient presents with  . Diarrhea    2-3 times per week  . Dysphagia    Doing fine    HPI: Monica [name] is a 80 y.o. female here for follow-up of diarrhea.  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  Completed surveillance EGDs November 2017, August 2019, recent EGD as outlined.  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  Completed colonoscopy January 2020 showing diverticulosis, 3 polyps removed which were tubular adenomas.  No colitis.  No future colonoscopies for surveillance purposes planned due to age.  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  Recently completed EGD March 2022 for history of dysphagia.  Noted to have large hiatal hernia with half the stomach above the diaphragm, moderate Schatzki ring status post dilation.  Also completed GI pathogen panel and C. difficile GDH which were both negative in February 2022 for diarrhea.  BM every day. Some days solid. But other days has urgent loose stool without warning and associated with incontinence.  Wears depends when she leaves the house.  This has been going on for months.  Sometimes mucus in the stool.  No melena or rectal bleeding.  Denies any recent change in medications.  Used to be on metformin 500 mg twice daily, was concerned that this may be contributing to her diarrhea so she cut back to once daily on her own.  She did note some improvement in her symptoms.No heartburn. Dysphagia resolved s/p dilation. No abdominal pain. No vomiting.  Chest CT February 2022: Large hiatal hernia with half of stomach in the chest.  Status post bilateral lower  lobectomies without recurrent or metastatic disease, pulmonary artery enlargement suggesting pulmonary arterial hypertension, borderline to mildly enlarged left hilar node, stable over multiple priors and likely reactive.  Current Outpatient Medications  Medication Sig Dispense Refill  . acetaminophen (TYLENOL) 325 MG tablet Take 650 mg by mouth every 6 (six) hours as needed for moderate pain or headache.    . citalopram (CELEXA) 20 MG tablet Take 20 mg by mouth daily.     Marland Kitchen ELIQUIS 5 MG TABS tablet TAKE 1 TABLET BY MOUTH  TWICE DAILY (Patient taking differently: Take 5 mg by mouth 2 (two) times daily.) 180 tablet 3  . losartan (COZAAR) 50 MG tablet TAKE 1 TABLET BY MOUTH  DAILY (Patient taking differently: Take 50 mg by mouth daily.) 90 tablet 3  . metFORMIN (GLUCOPHAGE-XR) 500 MG 24 hr tablet Take 500 mg by mouth in the morning and at bedtime.    . metoprolol tartrate (LOPRESSOR) 25 MG tablet TAKE 1 TABLET BY MOUTH TWICE A DAY (Patient taking differently: Take 25 mg by mouth 2 (two) times daily.) 180 tablet 0  . Multiple Vitamin (MULTIVITAMIN WITH MINERALS) TABS tablet Take 1 tablet by mouth daily.    . pantoprazole (PROTONIX) 40 MG tablet Take 1 tablet (40 mg total) by mouth daily. 90 tablet 3  . pioglitazone (ACTOS) 15 MG tablet Take 15 mg by mouth daily.    . Polyvinyl Alcohol-Povidone PF 1.4-0.6 % SOLN Place 1 drop into both eyes as needed (Dry eyes).     . pravastatin (PRAVACHOL)  40 MG tablet Take 40 mg by mouth at bedtime.      No current facility-administered medications for this visit.    Allergies as of 02/24/2021 - Review Complete 02/24/2021  Allergen Reaction Noted  . Elemental sulfur Hives 06/04/2016    ROS:  General: Negative for anorexia, weight loss, fever, chills, fatigue, weakness. ENT: Negative for hoarseness, difficulty swallowing , nasal congestion. CV: Negative for chest pain, angina, palpitations, dyspnea on exertion, peripheral edema.  Respiratory: Negative for  dyspnea at rest, dyspnea on exertion, cough, sputum, wheezing.  GI: See history of present illness. GU:  Negative for dysuria, hematuria, urinary incontinence, urinary frequency, nocturnal urination.  Endo: Negative for unusual weight change.    Physical Examination:   BP 108/64   Pulse 70   Temp (!) 97 F (36.1 C)   Ht 5\' 6"  (1.676 m)   Wt 172 lb 9.6 oz (78.3 kg)   BMI 27.86 kg/m   General: Well-nourished, well-developed in no acute distress.  Eyes: No icterus. Mouth: masked Abdomen: Bowel sounds are normal, nontender, nondistended, no hepatosplenomegaly or masses, no abdominal bruits or hernia , no rebound or guarding.   Extremities: No lower extremity edema. No clubbing or deformities. Neuro: Alert and oriented x 4   Skin: Warm and dry, no jaundice.   Psych: Alert and cooperative, normal mood and affect.  Labs:  Lab Results  Component Value Date   CREATININE 0.74 12/03/2020   BUN 13 12/03/2020   NA 133 (L) 12/03/2020   K 3.9 12/03/2020   CL 99 12/03/2020   CO2 25 12/03/2020   Lab Results  Component Value Date   ALT 14 12/03/2020   AST 19 12/03/2020   ALKPHOS 62 12/03/2020   BILITOT 0.5 12/03/2020   Lab Results  Component Value Date   WBC 6.7 12/03/2020   HGB 12.8 12/03/2020   HCT 39.5 12/03/2020   MCV 91.6 12/03/2020   PLT 261 12/03/2020   Lab Results  Component Value Date   IRON 78 12/03/2020   TIBC 384 12/03/2020   FERRITIN 22 12/03/2020   Lab Results  Component Value Date   VITAMINB12 1,310 (H) 12/03/2020   Lab Results  Component Value Date   FOLATE 17.2 12/03/2020    Imaging Studies: No results found.   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 fib on Eliquis, history of Schatzki ring status post dilation multiple times presenting for follow up of dysphagia and diarrhea.   Dysphagia:  resolved s/p esophageal dilation.  MALT lymphoma: Diagnosed in April 2017.  Treated with radiation.  Posttreatment EGD November 2017 revealed chronic gastritis but no evidence of lymphoid cells.  H. pylori negative.  EGD August 2019 with Schatzki ring status post dilation, large hiatal hernia, focal area about 4 x 4 cm along the greater curvature, somewhat erythematous and thickened mucosa with benign biopsies.  She was due for follow-up EGD in August 2020 but this was postponed in the setting of new lung cancer. Recent EGD with no evidence of lymphoma. Initial recommendation by oncology (Dr. Twana First) was for endoscopy annually for 5 years.   Diarrhea: 5-6 months of intermittent diarrhea. Denies any recent antibiotic exposure or medication changes.  No change in dietary intake.  No ill contacts.  Last colonoscopy in 2020 as outlined. Stool studies negative. Possibly related to increased metformin dosage.   1. Patient plans  to discuss with PCP at upcoming appointment, consider holding metformin for a few weeks to see if this is the cause of her diarrhea. 2. Continue pantoprazole 40 mg daily. 3. Primary edema half to 1 tablet 30 to 60 minutes before leaving house to try to prevent diarrhea.  May use up to 2 tablets daily as needed to control diarrhea. 4. Patient will call with a progress report in several weeks to let me know how she is doing.  To give an update about symptoms after coming off of metformin. 5. Currently office in 6 months for follow-up of hiatal hernia.

## 2021-02-24 NOTE — Patient Instructions (Addendum)
1. Discuss with your PCP regarding holding metformin for a few weeks to see if this is the cause of your diarrhea.  2. Continue pantoprazole 40 mg daily. 3. Try Imodium, 1/2 to 1 tablet 30 to 60 minutes before leaving your house to try to prevent diarrhea.  You can use Imodium up to 2 tablets daily on days that he needed to control diarrhea. 4. Call with a progress report in several weeks to let me know if your diarrhea resolved off of metformin. 5. Return to the office in six months for follow up of your hiatal hernia.

## 2021-02-25 ENCOUNTER — Encounter: Payer: Self-pay | Admitting: Gastroenterology

## 2021-03-04 DIAGNOSIS — L570 Actinic keratosis: Secondary | ICD-10-CM | POA: Diagnosis not present

## 2021-03-04 DIAGNOSIS — Z85828 Personal history of other malignant neoplasm of skin: Secondary | ICD-10-CM | POA: Diagnosis not present

## 2021-03-06 DIAGNOSIS — I10 Essential (primary) hypertension: Secondary | ICD-10-CM | POA: Diagnosis not present

## 2021-03-06 DIAGNOSIS — E7849 Other hyperlipidemia: Secondary | ICD-10-CM | POA: Diagnosis not present

## 2021-03-06 DIAGNOSIS — N183 Chronic kidney disease, stage 3 unspecified: Secondary | ICD-10-CM | POA: Diagnosis not present

## 2021-03-06 DIAGNOSIS — E782 Mixed hyperlipidemia: Secondary | ICD-10-CM | POA: Diagnosis not present

## 2021-03-06 DIAGNOSIS — E1122 Type 2 diabetes mellitus with diabetic chronic kidney disease: Secondary | ICD-10-CM | POA: Diagnosis not present

## 2021-03-09 DIAGNOSIS — N3020 Other chronic cystitis without hematuria: Secondary | ICD-10-CM | POA: Diagnosis not present

## 2021-03-10 DIAGNOSIS — Z23 Encounter for immunization: Secondary | ICD-10-CM | POA: Diagnosis not present

## 2021-03-10 DIAGNOSIS — R233 Spontaneous ecchymoses: Secondary | ICD-10-CM | POA: Diagnosis not present

## 2021-03-10 DIAGNOSIS — Z1389 Encounter for screening for other disorder: Secondary | ICD-10-CM | POA: Diagnosis not present

## 2021-03-10 DIAGNOSIS — C3490 Malignant neoplasm of unspecified part of unspecified bronchus or lung: Secondary | ICD-10-CM | POA: Diagnosis not present

## 2021-03-10 DIAGNOSIS — C884 Extranodal marginal zone B-cell lymphoma of mucosa-associated lymphoid tissue [MALT-lymphoma]: Secondary | ICD-10-CM | POA: Diagnosis not present

## 2021-03-10 DIAGNOSIS — I700 Atherosclerosis of aorta: Secondary | ICD-10-CM | POA: Diagnosis not present

## 2021-03-10 DIAGNOSIS — R059 Cough, unspecified: Secondary | ICD-10-CM | POA: Diagnosis not present

## 2021-06-08 DIAGNOSIS — R8271 Bacteriuria: Secondary | ICD-10-CM | POA: Diagnosis not present

## 2021-06-08 DIAGNOSIS — N3020 Other chronic cystitis without hematuria: Secondary | ICD-10-CM | POA: Diagnosis not present

## 2021-06-09 ENCOUNTER — Ambulatory Visit (HOSPITAL_COMMUNITY)
Admission: RE | Admit: 2021-06-09 | Discharge: 2021-06-09 | Disposition: A | Discharge status: Home / Self Care | Payer: Medicare Other | Source: Ambulatory Visit | Attending: Hematology | Admitting: Hematology

## 2021-06-09 ENCOUNTER — Other Ambulatory Visit: Payer: Self-pay

## 2021-06-09 ENCOUNTER — Inpatient Hospital Stay (HOSPITAL_COMMUNITY): Payer: Medicare Other | Attending: Hematology

## 2021-06-09 DIAGNOSIS — Z807 Family history of other malignant neoplasms of lymphoid, hematopoietic and related tissues: Secondary | ICD-10-CM | POA: Diagnosis not present

## 2021-06-09 DIAGNOSIS — C3431 Malignant neoplasm of lower lobe, right bronchus or lung: Secondary | ICD-10-CM

## 2021-06-09 DIAGNOSIS — Z85118 Personal history of other malignant neoplasm of bronchus and lung: Secondary | ICD-10-CM | POA: Diagnosis not present

## 2021-06-09 DIAGNOSIS — F17210 Nicotine dependence, cigarettes, uncomplicated: Secondary | ICD-10-CM | POA: Insufficient documentation

## 2021-06-09 DIAGNOSIS — Z90710 Acquired absence of both cervix and uterus: Secondary | ICD-10-CM | POA: Insufficient documentation

## 2021-06-09 DIAGNOSIS — C3490 Malignant neoplasm of unspecified part of unspecified bronchus or lung: Secondary | ICD-10-CM | POA: Diagnosis not present

## 2021-06-09 DIAGNOSIS — Z902 Acquired absence of lung [part of]: Secondary | ICD-10-CM | POA: Diagnosis not present

## 2021-06-09 DIAGNOSIS — E559 Vitamin D deficiency, unspecified: Secondary | ICD-10-CM | POA: Diagnosis not present

## 2021-06-09 DIAGNOSIS — K760 Fatty (change of) liver, not elsewhere classified: Secondary | ICD-10-CM | POA: Diagnosis not present

## 2021-06-09 DIAGNOSIS — J439 Emphysema, unspecified: Secondary | ICD-10-CM | POA: Diagnosis not present

## 2021-06-09 DIAGNOSIS — Z923 Personal history of irradiation: Secondary | ICD-10-CM | POA: Insufficient documentation

## 2021-06-09 DIAGNOSIS — K449 Diaphragmatic hernia without obstruction or gangrene: Secondary | ICD-10-CM | POA: Diagnosis not present

## 2021-06-09 DIAGNOSIS — C3432 Malignant neoplasm of lower lobe, left bronchus or lung: Secondary | ICD-10-CM | POA: Insufficient documentation

## 2021-06-09 DIAGNOSIS — I700 Atherosclerosis of aorta: Secondary | ICD-10-CM | POA: Diagnosis not present

## 2021-06-09 LAB — COMPREHENSIVE METABOLIC PANEL
ALT: 17 U/L (ref 0–44)
AST: 21 U/L (ref 15–41)
Albumin: 3.9 g/dL (ref 3.5–5.0)
Alkaline Phosphatase: 63 U/L (ref 38–126)
Anion gap: 9 (ref 5–15)
BUN: 10 mg/dL (ref 8–23)
CO2: 27 mmol/L (ref 22–32)
Calcium: 9.1 mg/dL (ref 8.9–10.3)
Chloride: 100 mmol/L (ref 98–111)
Creatinine, Ser: 0.82 mg/dL (ref 0.44–1.00)
GFR, Estimated: >60 mL/min (ref >60)
Glucose, Bld: 269 mg/dL — ABNORMAL HIGH (ref 70–99)
Potassium: 4 mmol/L (ref 3.5–5.1)
Sodium: 136 mmol/L (ref 135–145)
Total Bilirubin: 0.5 mg/dL (ref 0.3–1.2)
Total Protein: 7.4 g/dL (ref 6.5–8.1)

## 2021-06-09 LAB — FERRITIN: Ferritin: 23 ng/mL (ref 11–307)

## 2021-06-09 LAB — CBC WITH DIFFERENTIAL/PLATELET
Abs Immature Granulocytes: 0.02 10*3/uL (ref 0.00–0.07)
Basophils Absolute: 0.1 10*3/uL (ref 0.0–0.1)
Basophils Relative: 1 %
Eosinophils Absolute: 0.1 10*3/uL (ref 0.0–0.5)
Eosinophils Relative: 2 %
HCT: 40.9 % (ref 36.0–46.0)
Hemoglobin: 13.1 g/dL (ref 12.0–15.0)
Immature Granulocytes: 0 %
Lymphocytes Relative: 24 %
Lymphs Abs: 1.4 10*3/uL (ref 0.7–4.0)
MCH: 29.5 pg (ref 26.0–34.0)
MCHC: 32 g/dL (ref 30.0–36.0)
MCV: 92.1 fL (ref 80.0–100.0)
Monocytes Absolute: 0.4 10*3/uL (ref 0.1–1.0)
Monocytes Relative: 6 %
Neutro Abs: 4 10*3/uL (ref 1.7–7.7)
Neutrophils Relative %: 67 %
Platelets: 265 10*3/uL (ref 150–400)
RBC: 4.44 MIL/uL (ref 3.87–5.11)
RDW: 14.5 % (ref 11.5–15.5)
WBC: 5.9 10*3/uL (ref 4.0–10.5)
nRBC: 0 % (ref 0.0–0.2)

## 2021-06-09 LAB — LACTATE DEHYDROGENASE: LDH: 118 U/L (ref 98–192)

## 2021-06-09 LAB — VITAMIN D 25 HYDROXY (VIT D DEFICIENCY, FRACTURES): Vit D, 25-Hydroxy: 40.43 ng/mL (ref 30–100)

## 2021-06-09 LAB — IRON AND TIBC
Iron: 71 ug/dL (ref 28–170)
Saturation Ratios: 20 % (ref 10.4–31.8)
TIBC: 360 ug/dL (ref 250–450)
UIBC: 289 ug/dL

## 2021-06-09 MED ORDER — IOHEXOL 300 MG/ML  SOLN
75.0000 mL | Freq: Once | INTRAMUSCULAR | Status: AC | PRN
  Administered 2021-06-09: 75 mL via INTRAVENOUS

## 2021-06-15 NOTE — Progress Notes (Signed)
[place] [place], [place] [address]   CLINIC:  Medical Oncology/Hematology  PCP:  Manon Hilding, MD [address] [place] [address] [phone]   REASON FOR VISIT:  Follow-up for  left and right lung cancer  PRIOR THERAPY:  1. Left lower lobectomy on 05/19/2018. 2. VATS with right lower lobectomy on 09/10/2019.  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 Gala Romney, abnormal gastric mucosa. Pathology with EXTRANODAL Marginal zone lymphoma. NEGATIVE for H. Pylori   03/03/2016 Miscellaneous   H pylori stool antigen NEGATIVE   03/03/2016 PET scan   Focal area of hypermetabolism and wall thickening involving the body antral junction region of the stomach c/w history of lymphoma. 5.5 mm LLL pulm nodule not hypermetabolic. Non contrast chest CT in 6 month   03/18/2016 - 04/08/2016 Radiation Therapy   The gastric tumor received 30 Gy in 15 fractions of 2 Gy   Lung cancer ([place])  05/19/2018 Initial Diagnosis   Lung cancer ([place])   06/06/2018 Cancer Staging   Staging form: Lung, AJCC 8th Edition - Clinical: cT1b, cN0 - Signed by Zoila Shutter, MD on 06/06/2018     CANCER STAGING: Cancer Staging Lung cancer ([place]) Staging form: Lung, AJCC 8th Edition - Clinical: cT1b, cN0 - Signed by Zoila Shutter, MD on 06/06/2018  MALToma [facility]) Staging form: Lymphoid Neoplasms, AJCC 6th Edition - Clinical stage from 03/23/2016: Stage I - Signed by Baird Cancer, PA-C on 03/23/2016   INTERVAL HISTORY:  Monica [name], a 80 y.o. female, returns for routine follow-up of her left and right lung cancer. Monica [name] was last seen on 12/10/20.   Today she reports feeling well. She reports stable SOB with exertion, diarrhea once a month, and fatigue, and she denies fevers, weight loss, and night sweats.  REVIEW OF SYSTEMS:   Review of Systems  Constitutional:  Positive for fatigue (25%). Negative for appetite change, fever and unexpected weight change.  Respiratory:  Positive for cough and shortness of breath. Wheezing: w/ exertion.  Gastrointestinal:  Positive for diarrhea (occasional).  Genitourinary:  Positive for difficulty urinating (UTI).   All other systems reviewed and are negative.  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 Lindalou Hose 2009: Normal per PCP notes   COLONOSCOPY N/A 11/29/2018   [place]: Diverticulosis, 3 polyps removed.  No evidence of colitis.  Tubular adenomas.  No future surveillance colonoscopies recommended due to age.   ESOPHAGOGASTRODUODENOSCOPY     RMR: Prominant Schzgzki ring/component of peptic stricture status post dilation and disruption as described above, otherwise norma esophagus, moderate-sized hiatal hernia, antal pyloric channel, and posterier bulbar  erosions, otherwise unremarkable stomach, D1 and D2 . Inflammatory findings on the stomach and duodenum will likely be related to aspirin effect. We need to rule out Helicobacter pylor   ESOPHAGOGASTRODUODENOSCOPY N/A 03/10/2015   Dr. 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 Pathology with lymphoid population of stomach, slight atypia   ESOPHAGOGASTRODUODENOSCOPY N/A 02/03/2016   Dr. Gala Romney: Schatzki ring noted at GE junction, dilated with 43 and then 34 French Maloney dilator. Large hiatal hernia. 6 x 7 cm nodular geographically ulcerated mucosa, biopsy c/w MALToma   ESOPHAGOGASTRODUODENOSCOPY N/A 04/08/2016   Dr. Gala Romney: moderate Schatzki's ring/web s/p dilation, large hiatal hernia, localized area of gastric lymphoma visualized and appeared to be much improved. normal second portion of the duodenum. No specimens collected. Esophageal lumen notably tighted up significantly since her dilation in April of this year.    ESOPHAGOGASTRODUODENOSCOPY N/A 08/04/2016   Procedure: ESOPHAGOGASTRODUODENOSCOPY (EGD);  Surgeon: Daneil Dolin, MD;  [facility];  Service: Endoscopy;  Laterality: N/A;  7:30 am   ESOPHAGOGASTRODUODENOSCOPY N/A 09/01/2016   Procedure: ESOPHAGOGASTRODUODENOSCOPY (EGD);  Surgeon: Daneil Dolin, MD;  [facility];  Service: Endoscopy;  Laterality: N/A;  830    ESOPHAGOGASTRODUODENOSCOPY N/A 05/10/2017   Dr. Gala Romney: Moderate Schatzki ring at the GE junction, status post dilation with 28 [place].  Medium sized hiatal hernia.  Few localized erosions in the gastric antrum.  Stomach biopsy showed chronic gastritis, no H. pylori.  No atypical lymphoid infiltrates or other features of lymphoproliferative process   ESOPHAGOGASTRODUODENOSCOPY N/A 06/13/2018   Dr. Gala Romney: Schatzki ring status post dilation.  Large hiatal hernia.  Focal area 4 x 4 cm along the greater curvature, somewhat erythematous and thickened mucosa,  biopsy benign.  Next EGD in August 2020.   ESOPHAGOGASTRODUODENOSCOPY N/A 01/14/2021   Procedure: ESOPHAGOGASTRODUODENOSCOPY (EGD);  Surgeon: Daneil Dolin, MD;  [facility];  Service: Endoscopy;  Laterality: N/A;  AM   EYE SURGERY     INTERCOSTAL NERVE BLOCK Right 09/10/2019   Procedure: Intercostal Nerve Block;  Surgeon: [name], MD;  Location: [facility];  Service: Thoracic;  Laterality: Right;   LOBECTOMY Left 05/19/2018   Procedure: LEFT LOWER LOBECTOMY;  Surgeon: [name], MD;  Location: [facility];  Service: Thoracic;  Laterality: Left;   LYMPH NODE DISSECTION Right 09/10/2019   Procedure: Lymph Node Dissection;  Surgeon: [name], MD;  Location: [facility];  Service: Thoracic;  Laterality: Right;   MALONEY DILATION N/A 03/10/2015   Procedure: Keturah Shavers;  Surgeon: Daneil Dolin, MD;  [facility];  Service: Endoscopy;  Laterality: N/A;   MALONEY DILATION N/A 02/03/2016   Procedure: Venia Minks DILATION;  Surgeon: Daneil Dolin, MD;  [facility];  Service: Endoscopy;  Laterality: N/A;   MALONEY DILATION N/A 04/08/2016   Procedure: Venia Minks DILATION;  Surgeon: Daneil Dolin, MD;  [facility];  Service: Endoscopy;  Laterality: N/A;   MALONEY DILATION N/A 05/10/2017   Procedure: Venia Minks DILATION;  Surgeon: Daneil Dolin, MD;  [facility];  Service: Endoscopy;  Laterality: N/A;   MALONEY DILATION N/A 06/13/2018   Procedure: Venia Minks DILATION;  Surgeon: Daneil Dolin, MD;  [facility];  Service: Endoscopy;  Laterality: N/A;   MALONEY DILATION N/A 01/14/2021   Procedure: Venia Minks DILATION;  Surgeon: Daneil Dolin, MD;  [facility];  Service: Endoscopy;  Laterality: N/A;   POLYPECTOMY  11/29/2018   Procedure: POLYPECTOMY;  Surgeon: Daneil Dolin, MD;  [facility];  Service: Endoscopy;;   VIDEO ASSISTED THORACOSCOPY (VATS)/ LOBECTOMY Right 09/10/2019   Procedure: VIDEO ASSISTED  THORACOSCOPY (VATS)/RIGHT LOWER LOBECTOMY;  Surgeon: [name], MD;  [facility];  Service: Thoracic;  Laterality: Right;   VIDEO ASSISTED THORACOSCOPY (VATS)/WEDGE RESECTION Left 05/19/2018   Procedure: VIDEO ASSISTED THORACOSCOPY (VATS)/WEDGE RESECTION;  Surgeon: [name], MD;  Location: [place];  Service: Thoracic;  Laterality: Left;    SOCIAL HISTORY:  Social History   Socioeconomic History   Marital status: Married    Spouse name: Not on file   Number of children: Not on file   Years of education: Not on file   Highest education level: Not on file  Occupational History   Not on file  Tobacco Use   Smoking status: Some Days    Packs/day: 0.50    Years: 57.00    Pack years: 28.50    Types: Cigarettes    Last attempt to quit: 09/24/2019    Years since quitting: 1.7   Smokeless tobacco: Never   Tobacco comments:    trying to quit, wearing patch  Vaping Use   Vaping Use: Never used  Substance and Sexual Activity   Alcohol use: No    Alcohol/week: 0.0 standard drinks   Drug use: No   Sexual activity: Not on file  Other Topics Concern   Not on file  Social History Narrative   Not on file   Social Determinants of Health   Financial Resource Strain: Not on file  Food Insecurity: Not on file  Transportation Needs: Not on file  Physical Activity: Not on file  Stress: Not on file  Social Connections: Not on file  Intimate Partner Violence: Not on file    FAMILY HISTORY:  Family History  Problem Relation Age of Onset   Heart attack Father    Dementia Father    Diabetes Sister    Diabetes Brother    Diabetes Sister    Colon cancer Neg Hx     CURRENT MEDICATIONS:  Current Outpatient Medications  Medication Sig Dispense Refill   acetaminophen (TYLENOL) 325 MG tablet Take 650 mg by mouth every 6 (six) hours as needed for moderate pain or headache.     citalopram (CELEXA) 20 MG tablet Take 20 mg by mouth daily.      ELIQUIS 5 MG TABS  tablet TAKE 1 TABLET BY MOUTH  TWICE DAILY (Patient taking differently: Take 5 mg by mouth 2 (two) times daily.) 180 tablet 3   losartan (COZAAR) 50 MG tablet TAKE 1 TABLET BY MOUTH  DAILY (Patient taking differently: Take 50 mg by mouth daily.) 90 tablet 3   metFORMIN (GLUCOPHAGE-XR) 500 MG 24 hr tablet Take 500 mg by mouth daily with breakfast.     metoprolol tartrate (LOPRESSOR) 25 MG tablet TAKE 1 TABLET BY MOUTH TWICE A  DAY (Patient taking differently: Take 25 mg by mouth 2 (two) times daily.) 180 tablet 0   Multiple Vitamin (MULTIVITAMIN WITH MINERALS) TABS tablet Take 1 tablet by mouth daily.     pantoprazole (PROTONIX) 40 MG tablet Take 1 tablet (40 mg total) by mouth daily. 90 tablet 3   pioglitazone (ACTOS) 15 MG tablet Take 15 mg by mouth daily.     Polyvinyl Alcohol-Povidone PF 1.4-0.6 % SOLN Place 1 drop into both eyes as needed (Dry eyes).      pravastatin (PRAVACHOL) 40 MG tablet Take 40 mg by mouth at bedtime.      No current facility-administered medications for this visit.    ALLERGIES:  Allergies  Allergen Reactions   Elemental Sulfur Hives    PHYSICAL EXAM:  Performance status (ECOG): 1 - Symptomatic but completely ambulatory  There were no vitals filed for this visit. Wt Readings from Last 3 Encounters:  02/24/21 172 lb 9.6 oz (78.3 kg)  01/14/21 172 lb (78 kg)  12/22/20 172 lb 12.8 oz (78.4 kg)   Physical Exam Vitals reviewed.  Constitutional:      Appearance: Normal appearance.  Cardiovascular:     Rate and Rhythm: Normal rate and regular rhythm.     Pulses: Normal pulses.     Heart sounds: Normal heart sounds.  Pulmonary:     Effort: Pulmonary effort is normal.     Breath sounds: Normal breath sounds.  Abdominal:     Palpations: Abdomen is soft. There is no hepatomegaly, splenomegaly or mass.     Tenderness: There is no abdominal tenderness.  Lymphadenopathy:     Upper Body:     Right upper body: No axillary or pectoral adenopathy.     Left upper  body: No axillary or pectoral adenopathy.     Lower Body: No right inguinal adenopathy. No left inguinal adenopathy.  Neurological:     General: No focal deficit present.     Mental Status: She is alert and oriented to person, place, and time.  Psychiatric:        Mood and Affect: Mood normal.        Behavior: Behavior normal.     LABORATORY DATA:  I have reviewed the labs as listed.  CBC Latest Ref Rng & Units 06/09/2021 12/03/2020 07/23/2020  WBC 4.0 - 10.5 K/uL 5.9 6.7 5.8  Hemoglobin 12.0 - 15.0 g/dL 13.1 12.8 12.6  Hematocrit 36.0 - 46.0 % 40.9 39.5 39.2  Platelets 150 - 400 K/uL 265 261 275   CMP Latest Ref Rng & Units 06/09/2021 12/03/2020 07/23/2020  Glucose 70 - 99 mg/dL 269(H) 215(H) 223(H)  BUN 8 - 23 mg/dL _0 Creatinine 0.44 - 1.00 mg/dL 0.82 0.74 0.74  Sodium 135 - 145 mmol/L 136 133(L) 137  Potassium 3.5 - 5.1 mmol/L 4.0 3.9 4.3  Chloride 98 - 111 mmol/L 100 99 99  CO2 22 - 32 mmol/L _1 Calcium 8.9 - 10.3 mg/dL 9.1 9.3 9.6  Total Protein 6.5 - 8.1 g/dL 7.4 7.3 7.4  Total Bilirubin 0.3 - 1.2 mg/dL 0.5 0.5 0.7  Alkaline Phos 38 - 126 U/L 63 62 62  AST 15 - 41 U/L _2 ALT 0 - 44 U/L _3 DIAGNOSTIC IMAGING:  I have independently reviewed the scans and discussed with the patient. CT Chest W Contrast  Result Date: 06/10/2021 CLINICAL DATA:  Lung cancer. EXAM: CT CHEST WITH CONTRAST TECHNIQUE: Multidetector CT  imaging of the chest was performed during intravenous contrast administration. CONTRAST:  60m OMNIPAQUE IOHEXOL 300 MG/ML  SOLN COMPARISON:  12/03/2020. FINDINGS: Cardiovascular: Atherosclerotic calcification of the aorta, aortic valve and coronary arteries. Pulmonic trunk and heart are enlarged. No pericardial effusion. Mediastinum/Nodes: Subcentimeter low-attenuation lesion in the left thyroid. No follow-up recommended. (Ref: J Am Coll Radiol. 2015 Feb;12(2): 143-50).Mediastinal lymph nodes are not enlarged by CT size criteria. 13 mm left hilar  lymph node, unchanged. No axillary adenopathy. Esophagus is grossly unremarkable. Large hiatal hernia. Lungs/Pleura: Centrilobular emphysema. Bilateral lower lobectomies. Mild pleuroparenchymal scarring at the base of the left hemithorax. No pleural fluid. Airway is otherwise unremarkable. Upper Abdomen: Liver is decreased in attenuation diffusely. Visualized portions of the liver, gallbladder, adrenal glands, kidneys, spleen, pancreas, stomach and bowel are unremarkable with the exception of a large hiatal hernia. No upper abdominal adenopathy. Musculoskeletal: Degenerative changes in the spine. No worrisome lytic or sclerotic lesions. IMPRESSION: 1. No evidence of recurrent or metastatic disease. 2. Hepatic steatosis. 3. Large hiatal hernia. 4. Aortic atherosclerosis (ICD10-I70.0). Coronary artery calcification. 5. Enlarged pulmonic trunk, indicative of pulmonary arterial hypertension. 6.  Emphysema (ICD10-J43.9). Electronically Signed   By: MLorin PicketM.D.   On: 06/10/2021 11:33     ASSESSMENT:  1.  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 -Adjuvant therapy was not recommended.   2.  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     3.  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  H. pylori negative. -XRT 30 Gray in 15 fractions from 03/18/2016 through 04/18/2016. -EGD biopsy on 06/13/2018 shows mild chronic gastritis and small lymphoid aggregates with no features of lymphoma. -PET scan on 08/13/2019 did not show any abnormal uptake associated with abdominal organs.  No  lymphadenopathy. -Colonoscopy on 11/29/2018 showed diverticulosis in the sigmoid colon with 2 subcentimeter polyps in sigmoid colon.   PLAN:  1.  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  It showed hepatic steatosis and large hiatal hernia. - Also showed enlarged pulmonary trunk indicative of pulmonary artery hypertension.  She was told to follow-up with Dr. BHarl Bowie  We will also order another 2D echocardiogram. - RTC 6 months with repeat labs and CT chest.   2.  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  No evidence of recurrence or metastatic disease.   3.  Gastric marginal zone lymphoma: - No B symptoms or palpable adenopathy.  LDH is normal.  LFTs and CBC are also normal.   4.  Low vitamin D levels: - Continue vitamin D daily.  Levels are normal.   Orders placed this encounter:  No orders of the defined types were placed in this encounter.    SDerek Jack MD [phone]  I, KThana Ates am acting as a scribe for Dr. SDerek Jack  I, SDerek JackMD, have reviewed the above documentation for accuracy and completeness, and I agree with the above.

## 2021-06-16 ENCOUNTER — Other Ambulatory Visit: Payer: Self-pay

## 2021-06-16 ENCOUNTER — Inpatient Hospital Stay (HOSPITAL_COMMUNITY): Payer: Medicare Other | Admitting: Hematology

## 2021-06-16 VITALS — BP 161/66 | HR 63 | Temp 97.9°F | Resp 16 | Wt 171.0 lb

## 2021-06-16 DIAGNOSIS — C3432 Malignant neoplasm of lower lobe, left bronchus or lung: Secondary | ICD-10-CM

## 2021-06-16 DIAGNOSIS — Z85118 Personal history of other malignant neoplasm of bronchus and lung: Secondary | ICD-10-CM | POA: Diagnosis not present

## 2021-06-16 DIAGNOSIS — C3431 Malignant neoplasm of lower lobe, right bronchus or lung: Secondary | ICD-10-CM | POA: Diagnosis not present

## 2021-06-16 DIAGNOSIS — Z902 Acquired absence of lung [part of]: Secondary | ICD-10-CM | POA: Diagnosis not present

## 2021-06-16 DIAGNOSIS — K449 Diaphragmatic hernia without obstruction or gangrene: Secondary | ICD-10-CM | POA: Diagnosis not present

## 2021-06-16 DIAGNOSIS — Z807 Family history of other malignant neoplasms of lymphoid, hematopoietic and related tissues: Secondary | ICD-10-CM | POA: Diagnosis not present

## 2021-06-16 DIAGNOSIS — K760 Fatty (change of) liver, not elsewhere classified: Secondary | ICD-10-CM | POA: Diagnosis not present

## 2021-06-16 DIAGNOSIS — Z923 Personal history of irradiation: Secondary | ICD-10-CM | POA: Diagnosis not present

## 2021-06-16 DIAGNOSIS — F17210 Nicotine dependence, cigarettes, uncomplicated: Secondary | ICD-10-CM | POA: Diagnosis not present

## 2021-06-16 DIAGNOSIS — E559 Vitamin D deficiency, unspecified: Secondary | ICD-10-CM | POA: Diagnosis not present

## 2021-06-16 NOTE — Patient Instructions (Addendum)
[place] at [facility] Discharge Instructions  You were seen today by Dr. Delton Coombes. He went over your recent results. You will be scheduled for a CT scan of your chest as well as an echocardiogram prior to your next visit. Dr. Delton Coombes will see you back in 6 months for labs and follow up.   Thank you for choosing [place] at [facility] to provide your oncology and hematology care.  To afford each patient quality time with our provider, please arrive at least 15 minutes before your scheduled appointment time.   If you have a lab appointment with the [facility] please come in thru the Main Entrance and check in at the main information desk  You need to re-schedule your appointment should you arrive 10 or more minutes late.  We strive to give you quality time with our providers, and arriving late affects you and other patients whose appointments are after yours.  Also, if you no show three or more times for appointments you may be dismissed from the clinic at the providers discretion.     Again, thank you for choosing [facility].  Our hope is that these requests will decrease the amount of time that you wait before being seen by our physicians.       _____________________________________________________________  Should you have questions after your visit to [facility], please contact our office at [phone] between the hours of 8:00 a.m. and 4:30 p.m.  Voicemails left after 4:00 p.m. will not be returned until the following business day.  For prescription refill requests, have your pharmacy contact our office and allow 72 hours.    Cancer Center Support Programs:   > Cancer Support Group  2nd Tuesday of the month 1pm-2pm, Journey Room

## 2021-07-10 DIAGNOSIS — I10 Essential (primary) hypertension: Secondary | ICD-10-CM | POA: Diagnosis not present

## 2021-07-10 DIAGNOSIS — E1122 Type 2 diabetes mellitus with diabetic chronic kidney disease: Secondary | ICD-10-CM | POA: Diagnosis not present

## 2021-07-10 DIAGNOSIS — E7849 Other hyperlipidemia: Secondary | ICD-10-CM | POA: Diagnosis not present

## 2021-07-10 DIAGNOSIS — E782 Mixed hyperlipidemia: Secondary | ICD-10-CM | POA: Diagnosis not present

## 2021-07-10 DIAGNOSIS — N183 Chronic kidney disease, stage 3 unspecified: Secondary | ICD-10-CM | POA: Diagnosis not present

## 2021-07-10 DIAGNOSIS — R5383 Other fatigue: Secondary | ICD-10-CM | POA: Diagnosis not present

## 2021-07-13 ENCOUNTER — Encounter: Payer: Self-pay | Admitting: Cardiology

## 2021-07-13 ENCOUNTER — Encounter: Payer: Self-pay | Admitting: *Deleted

## 2021-07-13 ENCOUNTER — Ambulatory Visit: Payer: Medicare Other | Admitting: Cardiology

## 2021-07-13 VITALS — BP 162/80 | HR 69 | Ht 66.0 in | Wt 178.2 lb

## 2021-07-13 DIAGNOSIS — I480 Paroxysmal atrial fibrillation: Secondary | ICD-10-CM

## 2021-07-13 DIAGNOSIS — E782 Mixed hyperlipidemia: Secondary | ICD-10-CM | POA: Diagnosis not present

## 2021-07-13 DIAGNOSIS — R0602 Shortness of breath: Secondary | ICD-10-CM | POA: Diagnosis not present

## 2021-07-13 DIAGNOSIS — I10 Essential (primary) hypertension: Secondary | ICD-10-CM | POA: Diagnosis not present

## 2021-07-13 NOTE — Progress Notes (Signed)
Clinical Summary Monica [name] is a 80 y.o.female seen today for follow up of the following medical problems.   1. PAF - has not tolerated amio in the past    - no recent palpitatoins - compliant with meds, no bleeding on eliquis     2. History of lung cancer - s/p resection   3. Hyperlipidemia - compliant with meds   4. HTN - compliant with med - often clinic bp's higher than home bp's  5. Lung cancer - followed by oncology, listed as stage I squamous cell - lobectomy in 2019 left side and right side 2020  6. Enlarged pulmonary artery/pulmonic trunk - noted on 06/2021 CT scan, also with signs of emphysema - 2019 echo could not estiamted PASP, normal RV. 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 (Patient not taking: Reported on 06/16/2021)     cephALEXin (KEFLEX) 250 MG capsule Take by mouth.     citalopram (CELEXA) 20 MG tablet Take 20 mg by mouth daily.      ELIQUIS 5 MG TABS tablet TAKE 1 TABLET BY MOUTH  TWICE DAILY (Patient taking differently: Take 5 mg by mouth 2 (two) times daily.) 180 tablet 3   losartan (COZAAR) 50 MG tablet TAKE 1 TABLET BY MOUTH  DAILY (Patient taking differently: Take 50 mg by mouth daily.) 90 tablet 3   metFORMIN (GLUCOPHAGE-XR) 500 MG 24 hr tablet Take 500 mg by mouth daily with breakfast.     methenamine (HIPREX) 1 g tablet Take 1 g by mouth 2 (two) times daily.     metoprolol tartrate (LOPRESSOR) 25 MG tablet TAKE 1 TABLET BY MOUTH TWICE A DAY (Patient taking differently: Take 25 mg by mouth 2 (two) times daily.) 180 tablet 0   Multiple Vitamin (MULTIVITAMIN WITH MINERALS) TABS tablet Take 1 tablet by mouth daily.     pantoprazole (PROTONIX) 40 MG tablet Take 1 tablet (40 mg total) by mouth daily.  90 tablet 3   pioglitazone (ACTOS) 15 MG tablet Take 15 mg by mouth daily.     Polyvinyl Alcohol-Povidone PF 1.4-0.6 % SOLN Place 1 drop into both eyes as needed (Dry eyes).      pravastatin (PRAVACHOL) 40 MG tablet Take 40 mg by mouth at bedtime.      No current facility-administered medications for this visit.     Past Surgical History:  Procedure Laterality Date   ABDOMINAL HYSTERECTOMY     BIOPSY  09/01/2016   Procedure: BIOPSY;  Surgeon: Daneil Dolin, MD;  [facility];  Service: Endoscopy;;  gastric   BIOPSY  11/29/2018   Procedure: BIOPSY;  Surgeon: Daneil Dolin, MD;  [facility];  Service: Endoscopy;;  right colon   BLADDER SURGERY     CATARACT EXTRACTION W/ INTRAOCULAR LENS  IMPLANT, BILATERAL  2015   CATARACT EXTRACTION, BILATERAL     COLONOSCOPY     Dr. Lindalou Hose 2009: Normal per PCP notes   COLONOSCOPY N/A 11/29/2018   Rourk: Diverticulosis, 3 polyps removed.  No evidence of colitis.  Tubular adenomas.  No future surveillance colonoscopies recommended due to age.   ESOPHAGOGASTRODUODENOSCOPY     RMR: Prominant Schzgzki ring/component of peptic stricture status post  dilation and disruption as described above, otherwise norma esophagus, moderate-sized hiatal hernia, antal pyloric channel, and posterier bulbar erosions, otherwise unremarkable stomach, D1 and D2 . Inflammatory findings on the stomach and duodenum will likely be related to aspirin effect. We need to rule out Helicobacter pylor   ESOPHAGOGASTRODUODENOSCOPY N/A 03/10/2015   Dr. 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 Pathology with lymphoid population of stomach, slight atypia   ESOPHAGOGASTRODUODENOSCOPY N/A 02/03/2016   Dr. Gala Romney: Schatzki ring noted at GE junction, dilated with 20 and then 42 French Maloney dilator. Large hiatal hernia. 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 Gala Romney: moderate Schatzki's ring/web s/p dilation, large hiatal hernia, localized area of gastric lymphoma visualized and appeared to be much improved. normal second portion of the duodenum. No specimens collected. Esophageal lumen notably tighted up significantly since her dilation in April of this year.    ESOPHAGOGASTRODUODENOSCOPY N/A 08/04/2016   Procedure: ESOPHAGOGASTRODUODENOSCOPY (EGD);  Surgeon: Daneil Dolin, MD;  [facility];  Service: Endoscopy;  Laterality: N/A;  7:30 am   ESOPHAGOGASTRODUODENOSCOPY N/A 09/01/2016   Procedure: ESOPHAGOGASTRODUODENOSCOPY (EGD);  Surgeon: Daneil Dolin, MD;  [facility];  Service: Endoscopy;  Laterality: N/A;  830    ESOPHAGOGASTRODUODENOSCOPY N/A 05/10/2017   Dr. Gala Romney: Moderate Schatzki ring at the GE junction, status post dilation with 76 [place].  Medium sized hiatal hernia.  Few localized erosions in the gastric antrum.  Stomach biopsy showed chronic gastritis, no H. pylori.  No atypical lymphoid infiltrates or other features of lymphoproliferative process   ESOPHAGOGASTRODUODENOSCOPY N/A 06/13/2018   Dr. Gala Romney: Schatzki ring status post dilation.  Large hiatal hernia.  Focal area 4 x 4 cm along the greater curvature, somewhat erythematous and thickened mucosa, biopsy benign.  Next EGD in August 2020.   ESOPHAGOGASTRODUODENOSCOPY N/A 01/14/2021   Procedure: ESOPHAGOGASTRODUODENOSCOPY (EGD);  Surgeon: Daneil Dolin, MD;  [facility];  Service: Endoscopy;  Laterality: N/A;  AM   EYE SURGERY     INTERCOSTAL NERVE BLOCK Right 09/10/2019   Procedure: Intercostal Nerve Block;  Surgeon: [name], MD;  Location: [place];  Service: Thoracic;  Laterality: Right;   LOBECTOMY Left 05/19/2018   Procedure: LEFT LOWER LOBECTOMY;  Surgeon: [name], MD;  Location: [place];  Service: Thoracic;  Laterality: Left;   LYMPH NODE DISSECTION Right 09/10/2019   Procedure:  Lymph Node Dissection;  Surgeon: [name], MD;  Location: [place];  Service: Thoracic;  Laterality: Right;   MALONEY DILATION N/A 03/10/2015   Procedure: Keturah Shavers;  Surgeon: Daneil Dolin, MD;  [facility];  Service: Endoscopy;  Laterality: N/A;   MALONEY DILATION N/A 02/03/2016   Procedure: Venia Minks DILATION;  Surgeon: Daneil Dolin, MD;  [facility];  Service: Endoscopy;  Laterality: N/A;   MALONEY DILATION N/A 04/08/2016   Procedure: Venia Minks DILATION;  Surgeon: Daneil Dolin, MD;  [facility];  Service: Endoscopy;  Laterality: N/A;   MALONEY DILATION N/A 05/10/2017   Procedure: Venia Minks DILATION;  Surgeon: Daneil Dolin, MD;  [facility];  Service: Endoscopy;  Laterality: N/A;   MALONEY DILATION N/A 06/13/2018   Procedure: Venia Minks DILATION;  Surgeon: Daneil Dolin, MD;  [facility];  Service: Endoscopy;  Laterality: N/A;   MALONEY DILATION N/A 01/14/2021   Procedure: MALONEY DILATION;  Surgeon:  Rourk, Cristopher Estimable, MD;  [facility];  Service: Endoscopy;  Laterality: N/A;   POLYPECTOMY  11/29/2018   Procedure: POLYPECTOMY;  Surgeon: Daneil Dolin, MD;  [facility];  Service: Endoscopy;;   VIDEO ASSISTED THORACOSCOPY (VATS)/ LOBECTOMY Right 09/10/2019   Procedure: VIDEO ASSISTED THORACOSCOPY (VATS)/RIGHT LOWER LOBECTOMY;  Surgeon: [name], MD;  Location: [place];  Service: Thoracic;  Laterality: Right;   VIDEO ASSISTED THORACOSCOPY (VATS)/WEDGE RESECTION Left 05/19/2018   Procedure: VIDEO ASSISTED THORACOSCOPY (VATS)/WEDGE RESECTION;  Surgeon: [name], MD;  Location: [place];  Service: Thoracic;  Laterality: Left;     Allergies  Allergen Reactions   Elemental Sulfur Hives      Family History  Problem Relation Age of Onset   Heart attack Father    Dementia Father    Diabetes Sister    Diabetes Brother    Diabetes Sister    Colon cancer Neg Hx      Social  History Monica [name] reports that she has been smoking cigarettes. She has a 28.50 pack-year smoking history. She has never used smokeless tobacco. Monica [name] reports no history of alcohol use.   Review of Systems CONSTITUTIONAL: No weight loss, fever, chills, weakness or fatigue.  HEENT: Eyes: No visual loss, blurred vision, double vision or yellow sclerae.No hearing loss, sneezing, congestion, runny nose or sore throat.  SKIN: No rash or itching.  CARDIOVASCULAR: per hpi RESPIRATORY: No shortness of breath, cough or sputum.  GASTROINTESTINAL: No anorexia, nausea, vomiting or diarrhea. No abdominal pain or blood.  GENITOURINARY: No burning on urination, no polyuria NEUROLOGICAL: No headache, dizziness, syncope, paralysis, ataxia, numbness or tingling in the extremities. No change in bowel or bladder control.  MUSCULOSKELETAL: No muscle, back pain, joint pain or stiffness.  LYMPHATICS: No enlarged nodes. No history of splenectomy.  PSYCHIATRIC: No history of depression or anxiety.  ENDOCRINOLOGIC: No reports of sweating, cold or heat intolerance. No polyuria or polydipsia.  Marland Kitchen   Physical Examination Today's Vitals   07/13/21 0947  BP: (!) 162/80  Pulse: 69  SpO2: 96%  Weight: 178 lb 3.2 oz (80.8 kg)  Height: 5\' 6"  (1.676 m)   Body mass index is 28.76 kg/m.  Gen: resting comfortably, no acute distress HEENT: no scleral icterus, pupils equal round and reactive, no palptable cervical adenopathy,  CV: RRR, no mr/g no jvd Resp: Clear to auscultation bilaterally GI: abdomen is soft, non-tender, non-distended, normal bowel sounds, no hepatosplenomegaly MSK: extremities are warm, no edema.  Skin: warm, no rash Neuro:  no focal deficits Psych: appropriate affect   Diagnostic Studies  ECHO 2019: - Left ventricle: The cavity size was normal. Wall thickness was   increased in a pattern of moderate LVH. Systolic function was   vigorous. The estimated ejection fraction was in the range of  65%   to 70%. Wall motion was normal; there were no regional wall   motion abnormalities. Features are consistent with a pseudonormal   left ventricular filling pattern, with concomitant abnormal   relaxation and increased filling pressure (grade 2 diastolic   dysfunction). - Aortic valve: Mildly calcified annulus. Trileaflet. - Mitral valve: There was mild regurgitation. - Left atrium: The atrium was moderately dilated. - Right atrium: Central venous pressure (est): 3 mm Hg. - Atrial septum: No defect or patent foramen ovale was identified. - Tricuspid valve: There was trivial regurgitation. - Pulmonary arteries: Systolic pressure could not be accurately   estimated. - Pericardium, extracardiac: There was no  pericardial effusion     Assessment and Plan  1. PAF - no symptoms, continue current meds including eliquis.    2. HTN - elevated in clinic, she will call with home numbers later in the week. Room to titrate losartan if needed.    3. Hyperlipidemia -request pcp labs, continue statin  4. Enlarged PA - noted on CT scan - repeat echo to evlaute for any undelrying pulm HTN - history of tobacco, also grade II dd on prior echo. COuld be possible etiologies if she does have some pulmonary HTN   Arnoldo Lenis, M.D.

## 2021-07-13 NOTE — Patient Instructions (Addendum)
Medication Instructions:  Continue all current medications.  Labwork: none  Testing/Procedures: Your physician has requested that you have an echocardiogram. Echocardiography is a painless test that uses sound waves to create images of your heart. It provides your doctor with information about the size and shape of your heart and how well your heart's chambers and valves are working. This procedure takes approximately one hour. There are no restrictions for this procedure. Office will contact with results via phone or letter.    Follow-Up: 6 months   Any Other Special Instructions Will Be Listed Below (If Applicable). Please call the office Friday with list of BP readings.    If you need a refill on your cardiac medications before your next appointment, please call your pharmacy.

## 2021-07-15 DIAGNOSIS — C3490 Malignant neoplasm of unspecified part of unspecified bronchus or lung: Secondary | ICD-10-CM | POA: Diagnosis not present

## 2021-07-15 DIAGNOSIS — I10 Essential (primary) hypertension: Secondary | ICD-10-CM | POA: Diagnosis not present

## 2021-07-15 DIAGNOSIS — E7849 Other hyperlipidemia: Secondary | ICD-10-CM | POA: Diagnosis not present

## 2021-07-15 DIAGNOSIS — E1165 Type 2 diabetes mellitus with hyperglycemia: Secondary | ICD-10-CM | POA: Diagnosis not present

## 2021-07-15 DIAGNOSIS — C884 Extranodal marginal zone B-cell lymphoma of mucosa-associated lymphoid tissue [MALT-lymphoma]: Secondary | ICD-10-CM | POA: Diagnosis not present

## 2021-07-15 DIAGNOSIS — I700 Atherosclerosis of aorta: Secondary | ICD-10-CM | POA: Diagnosis not present

## 2021-07-15 DIAGNOSIS — Z23 Encounter for immunization: Secondary | ICD-10-CM | POA: Diagnosis not present

## 2021-07-17 ENCOUNTER — Telehealth: Payer: Self-pay | Admitting: Cardiology

## 2021-07-17 DIAGNOSIS — Z79899 Other long term (current) drug therapy: Secondary | ICD-10-CM

## 2021-07-17 MED ORDER — LOSARTAN POTASSIUM 100 MG PO TABS: 100.0000 mg | ORAL_TABLET | Freq: Every day | ORAL | 3 refills | Status: DC

## 2021-07-17 NOTE — Telephone Encounter (Signed)
I spoke with patient and she will increase losartan to 100 mg qd.E-scribed to [facility], [place].She will have BMET done in 2 weeks

## 2021-07-17 NOTE — Telephone Encounter (Signed)
BP READINGS:  9/13 147/69 9/14 152.63 9/15 132/57  Home # [phone]

## 2021-07-17 NOTE — Telephone Encounter (Signed)
Bp's too high, can she increase losartan to 100mg  daily. If having her labs with cancer center in the next 2 weeks we don't need to add any additional labs, if not then will need a bmet in 2 weeks   Bettey Costa MD

## 2021-07-17 NOTE — Telephone Encounter (Signed)
OV on  07/13/21 had BP at 162/80    I will forward readings to Dr.Branch

## 2021-08-03 ENCOUNTER — Ambulatory Visit (HOSPITAL_COMMUNITY)
Admission: RE | Admit: 2021-08-03 | Discharge: 2021-08-03 | Disposition: A | Discharge status: Home / Self Care | Payer: Medicare Other | Source: Ambulatory Visit | Attending: Cardiology | Admitting: Cardiology

## 2021-08-03 ENCOUNTER — Other Ambulatory Visit: Payer: Self-pay

## 2021-08-03 DIAGNOSIS — R0602 Shortness of breath: Secondary | ICD-10-CM | POA: Diagnosis not present

## 2021-08-03 LAB — ECHOCARDIOGRAM COMPLETE
Area-P 1/2: 2.99 cm2
S' Lateral: 1.8 cm

## 2021-08-03 NOTE — Progress Notes (Signed)
*  PRELIMINARY RESULTS* Echocardiogram 2D Echocardiogram has been performed.  Samuel Germany 08/03/2021, 10:16 AM

## 2021-08-05 DIAGNOSIS — Z23 Encounter for immunization: Secondary | ICD-10-CM | POA: Diagnosis not present

## 2021-08-26 ENCOUNTER — Ambulatory Visit: Payer: Medicare Other | Admitting: Gastroenterology

## 2021-09-08 DIAGNOSIS — R8271 Bacteriuria: Secondary | ICD-10-CM | POA: Diagnosis not present

## 2021-09-08 DIAGNOSIS — N3020 Other chronic cystitis without hematuria: Secondary | ICD-10-CM | POA: Diagnosis not present

## 2021-11-02 ENCOUNTER — Other Ambulatory Visit: Payer: Self-pay | Admitting: Cardiology

## 2021-11-03 NOTE — Telephone Encounter (Signed)
Prescription refill request for Eliquis received. Indication: PAF Last office visit: 07/13/21  Zandra Abts MD Scr: 0.77 on 07/10/21 Age: 81 Weight: 80.8kg  Based on above findings Eliquis 5mg  twice daily is the appropriate dose.  Refill approved.

## 2021-11-06 DIAGNOSIS — R300 Dysuria: Secondary | ICD-10-CM | POA: Diagnosis not present

## 2021-11-06 DIAGNOSIS — N3020 Other chronic cystitis without hematuria: Secondary | ICD-10-CM | POA: Diagnosis not present

## 2021-11-13 ENCOUNTER — Encounter: Payer: Self-pay | Admitting: Cardiology

## 2021-11-13 DIAGNOSIS — N183 Chronic kidney disease, stage 3 unspecified: Secondary | ICD-10-CM | POA: Diagnosis not present

## 2021-11-13 DIAGNOSIS — E1122 Type 2 diabetes mellitus with diabetic chronic kidney disease: Secondary | ICD-10-CM | POA: Diagnosis not present

## 2021-11-13 DIAGNOSIS — R5383 Other fatigue: Secondary | ICD-10-CM | POA: Diagnosis not present

## 2021-11-13 DIAGNOSIS — E782 Mixed hyperlipidemia: Secondary | ICD-10-CM | POA: Diagnosis not present

## 2021-11-13 DIAGNOSIS — E7849 Other hyperlipidemia: Secondary | ICD-10-CM | POA: Diagnosis not present

## 2021-11-13 DIAGNOSIS — I10 Essential (primary) hypertension: Secondary | ICD-10-CM | POA: Diagnosis not present

## 2021-11-17 DIAGNOSIS — C3490 Malignant neoplasm of unspecified part of unspecified bronchus or lung: Secondary | ICD-10-CM | POA: Diagnosis not present

## 2021-11-17 DIAGNOSIS — I700 Atherosclerosis of aorta: Secondary | ICD-10-CM | POA: Diagnosis not present

## 2021-11-17 DIAGNOSIS — K449 Diaphragmatic hernia without obstruction or gangrene: Secondary | ICD-10-CM | POA: Diagnosis not present

## 2021-11-17 DIAGNOSIS — C884 Extranodal marginal zone B-cell lymphoma of mucosa-associated lymphoid tissue [MALT-lymphoma]: Secondary | ICD-10-CM | POA: Diagnosis not present

## 2021-11-17 DIAGNOSIS — R233 Spontaneous ecchymoses: Secondary | ICD-10-CM | POA: Diagnosis not present

## 2021-11-17 DIAGNOSIS — M791 Myalgia, unspecified site: Secondary | ICD-10-CM | POA: Diagnosis not present

## 2021-11-17 DIAGNOSIS — M25472 Effusion, left ankle: Secondary | ICD-10-CM | POA: Diagnosis not present

## 2021-11-17 DIAGNOSIS — I10 Essential (primary) hypertension: Secondary | ICD-10-CM | POA: Diagnosis not present

## 2021-12-17 ENCOUNTER — Ambulatory Visit (HOSPITAL_COMMUNITY)
Admission: RE | Admit: 2021-12-17 | Discharge: 2021-12-17 | Disposition: A | Discharge status: Home / Self Care | Payer: Medicare Other | Source: Ambulatory Visit | Attending: Hematology | Admitting: Hematology

## 2021-12-17 ENCOUNTER — Other Ambulatory Visit (HOSPITAL_COMMUNITY): Payer: Medicare Other

## 2021-12-17 ENCOUNTER — Inpatient Hospital Stay (HOSPITAL_COMMUNITY): Payer: Medicare Other | Attending: Hematology

## 2021-12-17 ENCOUNTER — Other Ambulatory Visit: Payer: Self-pay

## 2021-12-17 DIAGNOSIS — E559 Vitamin D deficiency, unspecified: Secondary | ICD-10-CM | POA: Insufficient documentation

## 2021-12-17 DIAGNOSIS — J439 Emphysema, unspecified: Secondary | ICD-10-CM | POA: Diagnosis not present

## 2021-12-17 DIAGNOSIS — C3431 Malignant neoplasm of lower lobe, right bronchus or lung: Secondary | ICD-10-CM | POA: Diagnosis not present

## 2021-12-17 DIAGNOSIS — C3432 Malignant neoplasm of lower lobe, left bronchus or lung: Secondary | ICD-10-CM

## 2021-12-17 DIAGNOSIS — I700 Atherosclerosis of aorta: Secondary | ICD-10-CM | POA: Diagnosis not present

## 2021-12-17 DIAGNOSIS — F17210 Nicotine dependence, cigarettes, uncomplicated: Secondary | ICD-10-CM | POA: Insufficient documentation

## 2021-12-17 DIAGNOSIS — Z902 Acquired absence of lung [part of]: Secondary | ICD-10-CM | POA: Insufficient documentation

## 2021-12-17 DIAGNOSIS — Z79899 Other long term (current) drug therapy: Secondary | ICD-10-CM | POA: Insufficient documentation

## 2021-12-17 DIAGNOSIS — Z7901 Long term (current) use of anticoagulants: Secondary | ICD-10-CM | POA: Insufficient documentation

## 2021-12-17 DIAGNOSIS — Z8572 Personal history of non-Hodgkin lymphomas: Secondary | ICD-10-CM | POA: Insufficient documentation

## 2021-12-17 LAB — COMPREHENSIVE METABOLIC PANEL
ALT: 15 U/L (ref 0–44)
AST: 23 U/L (ref 15–41)
Albumin: 3.7 g/dL (ref 3.5–5.0)
Alkaline Phosphatase: 63 U/L (ref 38–126)
Anion gap: 14 (ref 5–15)
BUN: 14 mg/dL (ref 8–23)
CO2: 19 mmol/L — ABNORMAL LOW (ref 22–32)
Calcium: 8.9 mg/dL (ref 8.9–10.3)
Chloride: 100 mmol/L (ref 98–111)
Creatinine, Ser: 0.93 mg/dL (ref 0.44–1.00)
GFR, Estimated: >60 mL/min (ref >60)
Glucose, Bld: 242 mg/dL — ABNORMAL HIGH (ref 70–99)
Potassium: 3.7 mmol/L (ref 3.5–5.1)
Sodium: 133 mmol/L — ABNORMAL LOW (ref 135–145)
Total Bilirubin: 0.7 mg/dL (ref 0.3–1.2)
Total Protein: 7.1 g/dL (ref 6.5–8.1)

## 2021-12-17 LAB — CBC WITH DIFFERENTIAL/PLATELET
Abs Immature Granulocytes: 0.01 10*3/uL (ref 0.00–0.07)
Basophils Absolute: 0.1 10*3/uL (ref 0.0–0.1)
Basophils Relative: 1 %
Eosinophils Absolute: 0.1 10*3/uL (ref 0.0–0.5)
Eosinophils Relative: 2 %
HCT: 39.3 % (ref 36.0–46.0)
Hemoglobin: 12.7 g/dL (ref 12.0–15.0)
Immature Granulocytes: 0 %
Lymphocytes Relative: 26 %
Lymphs Abs: 1.6 10*3/uL (ref 0.7–4.0)
MCH: 30 pg (ref 26.0–34.0)
MCHC: 32.3 g/dL (ref 30.0–36.0)
MCV: 92.9 fL (ref 80.0–100.0)
Monocytes Absolute: 0.5 10*3/uL (ref 0.1–1.0)
Monocytes Relative: 8 %
Neutro Abs: 3.8 10*3/uL (ref 1.7–7.7)
Neutrophils Relative %: 63 %
Platelets: 233 10*3/uL (ref 150–400)
RBC: 4.23 MIL/uL (ref 3.87–5.11)
RDW: 14.2 % (ref 11.5–15.5)
WBC: 6 10*3/uL (ref 4.0–10.5)
nRBC: 0 % (ref 0.0–0.2)

## 2021-12-17 LAB — FERRITIN: Ferritin: 20 ng/mL (ref 11–307)

## 2021-12-17 LAB — LACTATE DEHYDROGENASE: LDH: 122 U/L (ref 98–192)

## 2021-12-17 LAB — IRON AND TIBC
Iron: 89 ug/dL (ref 28–170)
Saturation Ratios: 24 % (ref 10.4–31.8)
TIBC: 378 ug/dL (ref 250–450)
UIBC: 289 ug/dL

## 2021-12-17 LAB — VITAMIN D 25 HYDROXY (VIT D DEFICIENCY, FRACTURES): Vit D, 25-Hydroxy: 31.17 ng/mL (ref 30–100)

## 2021-12-17 MED ORDER — IOHEXOL 300 MG/ML  SOLN
75.0000 mL | Freq: Once | INTRAMUSCULAR | Status: AC | PRN
  Administered 2021-12-17: 75 mL via INTRAVENOUS

## 2021-12-24 ENCOUNTER — Inpatient Hospital Stay (HOSPITAL_COMMUNITY): Payer: Medicare Other | Admitting: Hematology

## 2021-12-24 ENCOUNTER — Other Ambulatory Visit (HOSPITAL_COMMUNITY): Payer: Self-pay | Admitting: *Deleted

## 2021-12-24 ENCOUNTER — Other Ambulatory Visit: Payer: Self-pay

## 2021-12-24 VITALS — BP 157/69 | HR 64 | Temp 98.7°F | Resp 18 | Ht 66.93 in | Wt 183.5 lb

## 2021-12-24 DIAGNOSIS — E611 Iron deficiency: Secondary | ICD-10-CM

## 2021-12-24 DIAGNOSIS — Z79899 Other long term (current) drug therapy: Secondary | ICD-10-CM | POA: Diagnosis not present

## 2021-12-24 DIAGNOSIS — N3020 Other chronic cystitis without hematuria: Secondary | ICD-10-CM | POA: Diagnosis not present

## 2021-12-24 DIAGNOSIS — C3431 Malignant neoplasm of lower lobe, right bronchus or lung: Secondary | ICD-10-CM | POA: Diagnosis not present

## 2021-12-24 DIAGNOSIS — Z8572 Personal history of non-Hodgkin lymphomas: Secondary | ICD-10-CM | POA: Insufficient documentation

## 2021-12-24 DIAGNOSIS — Z7901 Long term (current) use of anticoagulants: Secondary | ICD-10-CM | POA: Diagnosis not present

## 2021-12-24 DIAGNOSIS — C3432 Malignant neoplasm of lower lobe, left bronchus or lung: Secondary | ICD-10-CM | POA: Diagnosis not present

## 2021-12-24 DIAGNOSIS — E559 Vitamin D deficiency, unspecified: Secondary | ICD-10-CM | POA: Diagnosis not present

## 2021-12-24 DIAGNOSIS — F17210 Nicotine dependence, cigarettes, uncomplicated: Secondary | ICD-10-CM | POA: Diagnosis not present

## 2021-12-24 NOTE — Patient Instructions (Signed)
[place] at [facility] Discharge Instructions   You were seen and examined today by Dr. Delton Coombes.  He reviewed the results of your lab work and CT scan.  All results are normal.  Continue iron tablets and Vitamin D as you have been doing.  Return as scheduled in 6 months.    Thank you for choosing [place] at [facility] to provide your oncology and hematology care.  To afford each patient quality time with our provider, please arrive at least 15 minutes before your scheduled appointment time.   If you have a lab appointment with the [facility] please come in thru the Main Entrance and check in at the main information desk.  You need to re-schedule your appointment should you arrive 10 or more minutes late.  We strive to give you quality time with our providers, and arriving late affects you and other patients whose appointments are after yours.  Also, if you no show three or more times for appointments you may be dismissed from the clinic at the providers discretion.     Again, thank you for choosing [facility].  Our hope is that these requests will decrease the amount of time that you wait before being seen by our physicians.       _____________________________________________________________  Should you have questions after your visit to [facility], please contact our office at [phone] and follow the prompts.  Our office hours are 8:00 a.m. and 4:30 p.m. Monday - Friday.  Please note that voicemails left after 4:00 p.m. may not be returned until the following business day.  We are closed weekends and major holidays.  You do have access to a nurse 24-7, just call the main number to the clinic [phone] and do not press any options, hold on the line and a nurse will answer the phone.    For prescription refill requests, have your pharmacy contact our office and allow 72 hours.    Due to Covid, you  will need to wear a mask upon entering the hospital. If you do not have a mask, a mask will be given to you at the Main Entrance upon arrival. For doctor visits, patients may have 1 support person age 72 or older with them. For treatment visits, patients can not have anyone with them due to social distancing guidelines and our immunocompromised population.

## 2021-12-24 NOTE — Progress Notes (Signed)
[facility] [place], [place] [address]   CLINIC:  Medical Oncology/Hematology  PCP:  Manon Hilding, MD [address] [place] [address] [phone]   REASON FOR VISIT:  Follow-up for left and right lung cancer  PRIOR THERAPY:  1. Left lower lobectomy on 05/19/2018. 2. VATS with right lower lobectomy on 09/10/2019.  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 Gala Romney, abnormal gastric mucosa. Pathology with EXTRANODAL Marginal zone lymphoma. NEGATIVE for H. Pylori   03/03/2016 Miscellaneous   H pylori stool antigen NEGATIVE   03/03/2016 PET scan   Focal area of hypermetabolism and wall thickening involving the body antral junction region of the stomach c/w history of lymphoma. 5.5 mm LLL pulm nodule not hypermetabolic. Non contrast chest CT in 6 month   03/18/2016 - 04/08/2016 Radiation Therapy   The gastric tumor received 30 Gy in 15 fractions of 2 Gy   Lung cancer ([facility])  05/19/2018 Initial Diagnosis   Lung cancer ([facility])   06/06/2018 Cancer Staging   Staging form: Lung, AJCC 8th Edition - Clinical: cT1b, cN0 - Signed by Zoila Shutter, MD on 06/06/2018      CANCER STAGING: Cancer Staging  Lung cancer ([facility]) Staging form: Lung, AJCC 8th Edition - Clinical: cT1b, cN0 - Signed by Zoila Shutter, MD on 06/06/2018  MALToma [facility]) Staging form: Lymphoid Neoplasms, AJCC 6th Edition - Clinical stage from 03/23/2016: Stage I - Signed by Baird Cancer, PA-C on 03/23/2016   INTERVAL HISTORY:  Monica [name], a 81 y.o. female, returns for routine follow-up of her left and right lung cancer. Monica [name] was last seen on 06/16/2021.   Today she reports feeling good. She denies CP. She reports occasional palpitations. She denies cough and hemoptysis. Her appetite is good. She denies recent infections. She  denies fevers, night sweats, and weight loss.   REVIEW OF SYSTEMS:  Review of Systems  Constitutional:  Negative for appetite change, fatigue, fever and unexpected weight change.  HENT:   Positive for trouble swallowing.   Respiratory:  Positive for shortness of breath. Negative for cough and hemoptysis.   Cardiovascular:  Positive for palpitations. Negative for chest pain.  Endocrine: Negative for hot flashes.  All other systems reviewed and are negative.  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 Lindalou Hose 2009: Normal per PCP notes   COLONOSCOPY N/A 11/29/2018   [place]: Diverticulosis, 3 polyps removed.  No evidence of colitis.  Tubular adenomas.  No future surveillance colonoscopies recommended due to age.   ESOPHAGOGASTRODUODENOSCOPY     RMR: Prominant Schzgzki ring/component of peptic stricture status post dilation and disruption as described above, otherwise  norma esophagus, moderate-sized hiatal hernia, antal pyloric channel, and posterier bulbar erosions, otherwise unremarkable stomach, D1 and D2 . Inflammatory findings on the stomach and duodenum will likely be related to aspirin effect. We need to rule out Helicobacter pylor   ESOPHAGOGASTRODUODENOSCOPY N/A 03/10/2015   Dr. 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 Pathology with lymphoid population of stomach, slight atypia   ESOPHAGOGASTRODUODENOSCOPY N/A 02/03/2016   Dr. Gala Romney: Schatzki ring noted at GE junction, dilated with 57 and then 55 French Maloney dilator. Large hiatal hernia. 6 x 7 cm nodular geographically ulcerated mucosa, biopsy c/w MALToma   ESOPHAGOGASTRODUODENOSCOPY N/A 04/08/2016   Dr. Gala Romney: moderate Schatzki's ring/web s/p dilation, large hiatal hernia, localized area of gastric lymphoma visualized and appeared to be much improved. normal second portion of the duodenum. No specimens collected. Esophageal lumen notably tighted up significantly since her dilation in April of this year.    ESOPHAGOGASTRODUODENOSCOPY N/A 08/04/2016   Procedure: ESOPHAGOGASTRODUODENOSCOPY (EGD);  Surgeon: Daneil Dolin, MD;  [facility];  Service: Endoscopy;  Laterality: N/A;  7:30 am   ESOPHAGOGASTRODUODENOSCOPY N/A 09/01/2016   Procedure: ESOPHAGOGASTRODUODENOSCOPY (EGD);  Surgeon: Daneil Dolin, MD;  [facility];  Service: Endoscopy;  Laterality: N/A;  830    ESOPHAGOGASTRODUODENOSCOPY N/A 05/10/2017   Dr. Gala Romney: Moderate Schatzki ring at the GE junction, status post dilation with 28 [place].  Medium sized hiatal hernia.  Few localized erosions in the gastric antrum.  Stomach biopsy showed chronic gastritis, no H. pylori.  No atypical lymphoid infiltrates or other features of lymphoproliferative process   ESOPHAGOGASTRODUODENOSCOPY N/A 06/13/2018   Dr. Gala Romney: Schatzki ring status post dilation.  Large hiatal hernia.   Focal area 4 x 4 cm along the greater curvature, somewhat erythematous and thickened mucosa, biopsy benign.  Next EGD in August 2020.   ESOPHAGOGASTRODUODENOSCOPY N/A 01/14/2021   Procedure: ESOPHAGOGASTRODUODENOSCOPY (EGD);  Surgeon: Daneil Dolin, MD;  [facility];  Service: Endoscopy;  Laterality: N/A;  AM   EYE SURGERY     INTERCOSTAL NERVE BLOCK Right 09/10/2019   Procedure: Intercostal Nerve Block;  Surgeon: [name], MD;  Location: [facility];  Service: Thoracic;  Laterality: Right;   LOBECTOMY Left 05/19/2018   Procedure: LEFT LOWER LOBECTOMY;  Surgeon: [name], MD;  Location: [facility];  Service: Thoracic;  Laterality: Left;   LYMPH NODE DISSECTION Right 09/10/2019   Procedure: Lymph Node Dissection;  Surgeon: [name], MD;  Location: [facility];  Service: Thoracic;  Laterality: Right;   MALONEY DILATION N/A 03/10/2015   Procedure: Keturah Shavers;  Surgeon: Daneil Dolin, MD;  [facility];  Service: Endoscopy;  Laterality: N/A;   MALONEY DILATION N/A 02/03/2016   Procedure: Venia Minks DILATION;  Surgeon: Daneil Dolin, MD;  [facility];  Service: Endoscopy;  Laterality: N/A;   MALONEY DILATION  N/A 04/08/2016   Procedure: Venia Minks DILATION;  Surgeon: Daneil Dolin, MD;  [facility];  Service: Endoscopy;  Laterality: N/A;   MALONEY DILATION N/A 05/10/2017   Procedure: Venia Minks DILATION;  Surgeon: Daneil Dolin, MD;  [facility];  Service: Endoscopy;  Laterality: N/A;   MALONEY DILATION N/A 06/13/2018   Procedure: Venia Minks DILATION;  Surgeon: Daneil Dolin, MD;  [facility];  Service: Endoscopy;  Laterality: N/A;   MALONEY DILATION N/A 01/14/2021   Procedure: Venia Minks DILATION;  Surgeon: Daneil Dolin, MD;  [facility];  Service: Endoscopy;  Laterality: N/A;   POLYPECTOMY  11/29/2018   Procedure: POLYPECTOMY;  Surgeon: Daneil Dolin, MD;  [facility];  Service: Endoscopy;;    VIDEO ASSISTED THORACOSCOPY (VATS)/ LOBECTOMY Right 09/10/2019   Procedure: VIDEO ASSISTED THORACOSCOPY (VATS)/RIGHT LOWER LOBECTOMY;  Surgeon: [name], MD;  [facility];  Service: Thoracic;  Laterality: Right;   VIDEO ASSISTED THORACOSCOPY (VATS)/WEDGE RESECTION Left 05/19/2018   Procedure: VIDEO ASSISTED THORACOSCOPY (VATS)/WEDGE RESECTION;  Surgeon: [name], MD;  Location: [facility];  Service: Thoracic;  Laterality: Left;    SOCIAL HISTORY:  Social History   Socioeconomic History   Marital status: Married    Spouse name: Not on file   Number of children: Not on file   Years of education: Not on file   Highest education level: Not on file  Occupational History   Not on file  Tobacco Use   Smoking status: Some Days    Packs/day: 0.50    Years: 57.00    Pack years: 28.50    Types: Cigarettes    Last attempt to quit: 09/24/2019    Years since quitting: 2.2   Smokeless tobacco: Never   Tobacco comments:    trying to quit, wearing patch  Vaping Use   Vaping Use: Never used  Substance and Sexual Activity   Alcohol use: No    Alcohol/week: 0.0 standard drinks   Drug use: No   Sexual activity: Not on file  Other Topics Concern   Not on file  Social History Narrative   Not on file   Social Determinants of Health   Financial Resource Strain: Not on file  Food Insecurity: Not on file  Transportation Needs: Not on file  Physical Activity: Not on file  Stress: Not on file  Social Connections: Not on file  Intimate Partner Violence: Not on file    FAMILY HISTORY:  Family History  Problem Relation Age of Onset   Heart attack Father    Dementia Father    Diabetes Sister    Diabetes Brother    Diabetes Sister    Colon cancer Neg Hx     CURRENT MEDICATIONS:  Current Outpatient Medications  Medication Sig Dispense Refill   acetaminophen (TYLENOL) 325 MG tablet Take 650 mg by mouth every 6 (six) hours as needed for moderate pain or headache.      apixaban (ELIQUIS) 5 MG TABS tablet TAKE 1 TABLET BY MOUTH  TWICE DAILY 180 tablet 1   cephALEXin (KEFLEX) 250 MG capsule Take by mouth.     citalopram (CELEXA) 20 MG tablet Take 20 mg by mouth daily.      losartan (COZAAR) 100 MG tablet Take 1 tablet (100 mg total) by mouth daily. 90 tablet 3   metFORMIN (GLUCOPHAGE-XR) 500 MG 24 hr tablet Take 500 mg by mouth daily with breakfast.     methenamine (HIPREX)  1 g tablet Take 1 g by mouth 2 (two) times daily.     metoprolol tartrate (LOPRESSOR) 25 MG tablet TAKE 1 TABLET BY MOUTH TWICE A DAY (Patient taking differently: Take 25 mg by mouth 2 (two) times daily.) 180 tablet 0   Multiple Vitamin (MULTIVITAMIN WITH MINERALS) TABS tablet Take 1 tablet by mouth daily.     pantoprazole (PROTONIX) 40 MG tablet Take 1 tablet (40 mg total) by mouth daily. 90 tablet 3   pioglitazone (ACTOS) 15 MG tablet Take 15 mg by mouth daily.     Polyvinyl Alcohol-Povidone PF 1.4-0.6 % SOLN Place 1 drop into both eyes as needed (Dry eyes).      pravastatin (PRAVACHOL) 40 MG tablet Take 40 mg by mouth at bedtime.      No current facility-administered medications for this visit.    ALLERGIES:  Allergies  Allergen Reactions   Elemental Sulfur Hives    PHYSICAL EXAM:  Performance status (ECOG): 1 - Symptomatic but completely ambulatory  There were no vitals filed for this visit. Wt Readings from Last 3 Encounters:  07/13/21 178 lb 3.2 oz (80.8 kg)  06/16/21 171 lb (77.6 kg)  02/24/21 172 lb 9.6 oz (78.3 kg)   Physical Exam Vitals reviewed.  Constitutional:      Appearance: Normal appearance.  Cardiovascular:     Rate and Rhythm: Normal rate and regular rhythm.     Pulses: Normal pulses.     Heart sounds: Normal heart sounds.  Pulmonary:     Effort: Pulmonary effort is normal.     Breath sounds: Normal breath sounds.  Neurological:     General: No focal deficit present.     Mental Status: She is alert and oriented to person, place, and time.   Psychiatric:        Mood and Affect: Mood normal.        Behavior: Behavior normal.     LABORATORY DATA:  I have reviewed the labs as listed.  CBC Latest Ref Rng & Units 12/17/2021 06/09/2021 12/03/2020  WBC 4.0 - 10.5 K/uL 6.0 5.9 6.7  Hemoglobin 12.0 - 15.0 g/dL 12.7 13.1 12.8  Hematocrit 36.0 - 46.0 % 39.3 40.9 39.5  Platelets 150 - 400 K/uL 233 265 261   CMP Latest Ref Rng & Units 12/17/2021 06/09/2021 12/03/2020  Glucose 70 - 99 mg/dL 242(H) 269(H) 215(H)  BUN 8 - 23 mg/dL 14 10 13   Creatinine 0.44 - 1.00 mg/dL 0.93 0.82 0.74  Sodium 135 - 145 mmol/L 133(L) 136 133(L)  Potassium 3.5 - 5.1 mmol/L 3.7 4.0 3.9  Chloride 98 - 111 mmol/L 100 100 99  CO2 22 - 32 mmol/L 19(L) 27 25  Calcium 8.9 - 10.3 mg/dL 8.9 9.1 9.3  Total Protein 6.5 - 8.1 g/dL 7.1 7.4 7.3  Total Bilirubin 0.3 - 1.2 mg/dL 0.7 0.5 0.5  Alkaline Phos 38 - 126 U/L 63 63 62  AST 15 - 41 U/L 23 21 19   ALT 0 - 44 U/L 15 17 14     DIAGNOSTIC IMAGING:  I have independently reviewed the scans and discussed with the patient. CT Chest W Contrast  Result Date: 12/18/2021 CLINICAL DATA:  81 year old female with history of non-small cell lung cancer, status post surgical resection in 2019. Follow-up study. EXAM: CT CHEST WITH CONTRAST TECHNIQUE: Multidetector CT imaging of the chest was performed during intravenous contrast administration. RADIATION DOSE REDUCTION: This exam was performed according to the departmental dose-optimization program which includes automated exposure control, adjustment  of the mA and/or kV according to patient size and/or use of iterative reconstruction technique. CONTRAST:  5mL OMNIPAQUE IOHEXOL 300 MG/ML  SOLN COMPARISON:  Chest CTA 07/2021. FINDINGS: Cardiovascular: Heart size is enlarged with left atrial dilatation. There is no significant pericardial fluid, thickening or pericardial calcification. There is aortic atherosclerosis, as well as atherosclerosis of the great vessels of the mediastinum and the  coronary arteries, including calcified atherosclerotic plaque in the left main, left anterior descending, left circumflex and right coronary arteries. Calcifications of the mitral annulus. Thickening and calcification of the aortic valve. Mediastinum/Nodes: No pathologically enlarged mediastinal or hilar lymph nodes. Large hiatal hernia. No axillary lymphadenopathy. Lungs/Pleura: Status post left lower lobectomy. Compensatory hyperexpansion of the left upper lobe. No suspicious appearing pulmonary nodules or masses are noted. No acute consolidative airspace disease. No pleural effusions. Diffuse bronchial wall thickening with moderate to severe centrilobular and paraseptal emphysema. Upper Abdomen: Aortic atherosclerosis. Diffuse low attenuation throughout the visualized hepatic parenchyma, indicative of hepatic steatosis. Musculoskeletal: There are no aggressive appearing lytic or blastic lesions noted in the visualized portions of the skeleton. IMPRESSION: 1. Status post left lower lobectomy with no findings to suggest metastatic disease in the thorax. 2. Hepatic steatosis. 3. Large hiatal hernia. 4. Aortic atherosclerosis, in addition to left main and three-vessel coronary artery disease. Assessment for potential risk factor modification, dietary therapy or pharmacologic therapy may be warranted, if clinically indicated. 5. Diffuse bronchial wall thickening with moderate to severe centrilobular and paraseptal emphysema; imaging findings suggestive of underlying COPD. 6. Cardiomegaly with left atrial dilatation. 7. There are calcifications of the aortic valve. Echocardiographic correlation for evaluation of potential valvular dysfunction may be warranted if clinically indicated. Aortic Atherosclerosis (ICD10-I70.0) and Emphysema (ICD10-J43.9). Electronically Signed   By: Vinnie Langton M.D.   On: 12/18/2021 06:23     ASSESSMENT:  1.  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 -Adjuvant therapy was not recommended.   2.  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  3.  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  H. pylori negative. -XRT 30 Gray in 15 fractions from 03/18/2016 through 04/18/2016. -EGD biopsy on 06/13/2018 shows mild chronic gastritis and small lymphoid aggregates with no features of lymphoma. -PET scan on 08/13/2019 did not show any abnormal uptake associated with abdominal organs.  No lymphadenopathy. -Colonoscopy on 11/29/2018 showed diverticulosis in the sigmoid colon with 2 subcentimeter polyps in sigmoid colon.  PLAN:  1.  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 - No recent infections. - Reviewed CT chest with contrast from 12/18/2021 which showed left lower lobectomy with no findings suggestive of metastatic disease.  No adenopathy.  Other nonmalignant findings were explained to the patient including hepatic steatosis, hiatal hernia, aortic atherosclerosis, emphysema and other cardiac findings. - RTC 6 months for follow-up.  We will repeat a CT chest without contrast at next visit.   2.  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   3.  Gastric marginal zone lymphoma: - No B symptoms  or palpable adenopathy.  LDH was normal.  No recurrent infections.   4.  Low vitamin D levels: - Continue vitamin D daily.  Vitamin D level is 31.   Orders placed this encounter:  No orders of the defined types were placed in this  encounter.    Derek Jack, MD [place] [phone]   I, Thana Ates, am acting as a scribe for Dr. Derek Jack.  I, Derek Jack MD, have reviewed the above documentation for accuracy and completeness, and I agree with the above.

## 2022-01-03 ENCOUNTER — Other Ambulatory Visit: Payer: Self-pay | Admitting: Cardiology

## 2022-01-04 ENCOUNTER — Telehealth: Payer: Self-pay

## 2022-01-04 ENCOUNTER — Ambulatory Visit: Payer: Medicare Other | Admitting: Gastroenterology

## 2022-01-04 ENCOUNTER — Other Ambulatory Visit: Payer: Self-pay

## 2022-01-04 ENCOUNTER — Encounter: Payer: Self-pay | Admitting: Gastroenterology

## 2022-01-04 VITALS — BP 164/79 | HR 65 | Temp 97.5°F | Ht 66.5 in | Wt 182.4 lb

## 2022-01-04 DIAGNOSIS — K449 Diaphragmatic hernia without obstruction or gangrene: Secondary | ICD-10-CM

## 2022-01-04 DIAGNOSIS — K219 Gastro-esophageal reflux disease without esophagitis: Secondary | ICD-10-CM | POA: Insufficient documentation

## 2022-01-04 MED ORDER — LOSARTAN POTASSIUM 100 MG PO TABS: 100.0000 mg | ORAL_TABLET | Freq: Every day | ORAL | 3 refills | Status: DC

## 2022-01-04 NOTE — Telephone Encounter (Signed)
Medication refill request for Losartan 100 mg tablets approved and sent to pharmacy. ?

## 2022-01-04 NOTE — Progress Notes (Signed)
Primary Care Physician: Manon Hilding, MD  Primary Gastroenterologist:  Garfield Cornea, MD  Chief Complaint  Patient presents with   Follow-up    Reflux is controlled with current medication, no diarrhea lately.     HPI: Monica [name] is a 81 y.o. female here for follow up. She was last seen 01/2021. 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  Completed surveillance EGDs November 2017, August 2019, recent EGD as outlined below.  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 Completed colonoscopy January 2020 showing diverticulosis, 3 polyps removed which were tubular adenomas.  No colitis.  No future colonoscopies for surveillance purposes planned due to age.  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 Completed EGD March 2022 for history of dysphagia.  Noted to have large hiatal hernia with half the stomach above the diaphragm, moderate Schatzki ring status post dilation.   Chest CT February 2022: Large hiatal hernia with half of stomach in the chest.  Status post bilateral lower lobectomies without recurrent or metastatic disease, pulmonary artery enlargement suggesting pulmonary arterial hypertension, borderline to mildly enlarged left hilar node, stable over multiple priors and likely reactive.  Reflux doing ok. Has trouble with cornbread. Triggers cough. Chops food finely and usually no problem swallowing. No heartburn. No abdominal pain. BM regular. No diarrhea. No melena, brbpr.   Current Outpatient Medications  Medication Sig Dispense Refill   acetaminophen (TYLENOL) 325 MG tablet Take 650 mg by mouth every 6 (six) hours as needed for moderate pain or headache.     apixaban (ELIQUIS) 5 MG TABS tablet TAKE 1 TABLET BY MOUTH  TWICE DAILY 180  tablet 1   citalopram (CELEXA) 20 MG tablet Take 20 mg by mouth daily.      losartan (COZAAR) 100 MG tablet Take 1 tablet (100 mg total) by mouth daily. 90 tablet 3   metFORMIN (GLUCOPHAGE-XR) 500 MG 24 hr tablet Take 500 mg by mouth daily with breakfast.     metoprolol tartrate (LOPRESSOR) 25 MG tablet TAKE 1 TABLET BY MOUTH TWICE A DAY (Patient taking differently: Take 25 mg by mouth 2 (two) times daily.) 180 tablet 0   Multiple Vitamin (MULTIVITAMIN WITH MINERALS) TABS tablet Take 1 tablet by mouth daily.     pantoprazole (PROTONIX) 40 MG tablet Take 1 tablet (40 mg total) by mouth daily. 90 tablet 3   pioglitazone (ACTOS) 15 MG tablet Take 15 mg by mouth daily.     Polyvinyl Alcohol-Povidone PF 1.4-0.6 % SOLN Place 1 drop into both eyes as needed (Dry eyes).      pravastatin (PRAVACHOL) 40 MG tablet Take 40 mg by mouth at bedtime.      No current facility-administered medications for this visit.    Allergies as of 01/04/2022 - Review Complete 01/04/2022  Allergen Reaction Noted   Elemental sulfur Hives 06/04/2016    ROS:  General: Negative for anorexia, weight loss, fever, chills, fatigue, weakness. ENT: Negative for hoarseness, difficulty swallowing , nasal congestion. CV: Negative for chest pain, angina, palpitations, dyspnea on exertion, peripheral edema.  Respiratory: Negative for dyspnea at rest, dyspnea on exertion, cough, sputum, wheezing.  GI: See history of present illness. GU:  Negative for dysuria, hematuria, urinary incontinence, urinary frequency, nocturnal urination.  Endo: Negative for unusual  weight change.    Physical Examination:   BP (!) 185/85 (BP Location: Right Arm, Patient Position: Sitting, Cuff Size: Large)    Pulse 65    Temp (!) 97.5 F (36.4 C) (Temporal)    Ht 5' 6.5" (1.689 m)    Wt 182 lb 6.4 oz (82.7 kg)    SpO2 99%    BMI 29.00 kg/m  Repeat BP 169/79  General: Well-nourished, well-developed in no acute distress.  Eyes: No icterus. Mouth:  masked Lungs: Clear to auscultation bilaterally.  Heart: Regular rate and rhythm, no murmurs rubs or gallops.  Abdomen: Bowel sounds are normal, nontender, nondistended, no hepatosplenomegaly or masses, no abdominal bruits or hernia , no rebound or guarding.   Extremities: No lower extremity edema. No clubbing or deformities. Neuro: Alert and oriented x 4   Skin: Warm and dry, no jaundice.   Psych: Alert and cooperative, normal mood and affect.  Labs:  Lab Results  Component Value Date   CREATININE 0.93 12/17/2021   BUN 14 12/17/2021   NA 133 (L) 12/17/2021   K 3.7 12/17/2021   CL 100 12/17/2021   CO2 19 (L) 12/17/2021   Lab Results  Component Value Date   ALT 15 12/17/2021   AST 23 12/17/2021   ALKPHOS 63 12/17/2021   BILITOT 0.7 12/17/2021   Lab Results  Component Value Date   WBC 6.0 12/17/2021   HGB 12.7 12/17/2021   HCT 39.3 12/17/2021   MCV 92.9 12/17/2021   PLT 233 12/17/2021   Lab Results  Component Value Date   IRON 89 12/17/2021   TIBC 378 12/17/2021   FERRITIN 20 12/17/2021     Imaging Studies: CT Chest W Contrast  Result Date: 12/18/2021 CLINICAL DATA:  81 year old female with history of non-small cell lung cancer, status post surgical resection in 2019. Follow-up study. EXAM: CT CHEST WITH CONTRAST TECHNIQUE: Multidetector CT imaging of the chest was performed during intravenous contrast administration. RADIATION DOSE REDUCTION: This exam was performed according to the departmental dose-optimization program which includes automated exposure control, adjustment of the mA and/or kV according to patient size and/or use of iterative reconstruction technique. CONTRAST:  19mL OMNIPAQUE IOHEXOL 300 MG/ML  SOLN COMPARISON:  Chest CTA 07/2021. FINDINGS: Cardiovascular: Heart size is enlarged with left atrial dilatation. There is no significant pericardial fluid, thickening or pericardial calcification. There is aortic atherosclerosis, as well as atherosclerosis of the  great vessels of the mediastinum and the coronary arteries, including calcified atherosclerotic plaque in the left main, left anterior descending, left circumflex and right coronary arteries. Calcifications of the mitral annulus. Thickening and calcification of the aortic valve. Mediastinum/Nodes: No pathologically enlarged mediastinal or hilar lymph nodes. Large hiatal hernia. No axillary lymphadenopathy. Lungs/Pleura: Status post left lower lobectomy. Compensatory hyperexpansion of the left upper lobe. No suspicious appearing pulmonary nodules or masses are noted. No acute consolidative airspace disease. No pleural effusions. Diffuse bronchial wall thickening with moderate to severe centrilobular and paraseptal emphysema. Upper Abdomen: Aortic atherosclerosis. Diffuse low attenuation throughout the visualized hepatic parenchyma, indicative of hepatic steatosis. Musculoskeletal: There are no aggressive appearing lytic or blastic lesions noted in the visualized portions of the skeleton. IMPRESSION: 1. Status post left lower lobectomy with no findings to suggest metastatic disease in the thorax. 2. Hepatic steatosis. 3. Large hiatal hernia. 4. Aortic atherosclerosis, in addition to left main and three-vessel coronary artery disease. Assessment for potential risk factor modification, dietary therapy or pharmacologic therapy may be warranted, if clinically indicated. 5. Diffuse bronchial  wall thickening with moderate to severe centrilobular and paraseptal emphysema; imaging findings suggestive of underlying COPD. 6. Cardiomegaly with left atrial dilatation. 7. There are calcifications of the aortic valve. Echocardiographic correlation for evaluation of potential valvular dysfunction may be warranted if clinically indicated. Aortic Atherosclerosis (ICD10-I70.0) and Emphysema (ICD10-J43.9). Electronically Signed   By: Vinnie Langton M.D.   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 fib on Eliquis, history of Schatzki ring status post dilation multiple times presenting for follow up of dysphagia and diarrhea.    GERD/Dysphagia/large hiatal hernia: reflux well controlled, dysphagia resolved s/p esophageal dilation, 12/2020.    MALT lymphoma: Diagnosed in April 2017.  Treated with radiation.  Posttreatment EGD November 2017 revealed chronic gastritis but no evidence of lymphoid cells.  H. pylori negative.  EGD August 2019 with Schatzki ring status post dilation, large hiatal hernia, focal area about 4 x 4 cm along the greater curvature, somewhat erythematous and thickened mucosa with benign biopsies. EGD 12/2020, with no evidence of lymphoma, she had large hiatal hernia with half the stomach above the diaphragm, moderate Schatzki ring s/p dilation.  Initial recommendation by oncology (Dr. Twana First) was for endoscopy annually for 5 years. No further surveillance recommended.    Diarrhea: resolved.   Plan: Pantoprazole 40mg  once daily before breakfast. BP up initial but improved with retake, still elevated. Encouraged she keep a log and bring to her cardiology appt next week. Also provided her with BP values for which she should contact cardiology sooner.  Return ov here in one year or call sooner if needed.

## 2022-01-04 NOTE — Patient Instructions (Addendum)
Continue pantoprazole 40mg  daily before breakfast. ?Take your blood pressures at home and keep a log to take with you to your appointment next week with cardiology.  ?If your blood pressure remains with top number over 180 or bottom number over 100, please contact your cardiologist for further instructions.  ?Return to the office in one year or call sooner if you have any problems.  ?

## 2022-01-11 ENCOUNTER — Other Ambulatory Visit: Payer: Self-pay

## 2022-01-11 ENCOUNTER — Encounter: Payer: Self-pay | Admitting: Cardiology

## 2022-01-11 ENCOUNTER — Ambulatory Visit: Payer: Medicare Other | Admitting: Cardiology

## 2022-01-11 VITALS — BP 156/82 | HR 59 | Ht 66.5 in | Wt 183.0 lb

## 2022-01-11 DIAGNOSIS — E782 Mixed hyperlipidemia: Secondary | ICD-10-CM

## 2022-01-11 DIAGNOSIS — I480 Paroxysmal atrial fibrillation: Secondary | ICD-10-CM | POA: Diagnosis not present

## 2022-01-11 DIAGNOSIS — D6869 Other thrombophilia: Secondary | ICD-10-CM | POA: Diagnosis not present

## 2022-01-11 DIAGNOSIS — I2510 Atherosclerotic heart disease of native coronary artery without angina pectoris: Secondary | ICD-10-CM | POA: Diagnosis not present

## 2022-01-11 DIAGNOSIS — I272 Pulmonary hypertension, unspecified: Secondary | ICD-10-CM

## 2022-01-11 MED ORDER — AMLODIPINE BESYLATE 5 MG PO TABS: 5.0000 mg | ORAL_TABLET | Freq: Every day | ORAL | 3 refills | Status: DC

## 2022-01-11 NOTE — Patient Instructions (Addendum)
Medication Instructions:  ?Your physician has recommended you make the following change in your medication:  ?Start amlodipine 5 mg once a day ?Continue all other medications as directed. ? ?Labwork: ?none ? ?Testing/Procedures: ?none ? ?Follow-Up: ?Your physician recommends that you schedule a follow-up appointment in: 6 months ? ?Any Other Special Instructions Will Be Listed Below (If Applicable). ? ?If you need a refill on your cardiac medications before your next appointment, please call your pharmacy. ? ?

## 2022-01-11 NOTE — Progress Notes (Signed)
Clinical Summary Ms. Polizzi is a 81 y.o.female seen today for follow up of the following medical problems.    1. PAF - has not tolerated amio in the past     -very rare palpitaiotns - no bleeding on eliquis - compliant withmeds     2. History of lung cancer - s/p resection   3. Hyperlipidemia - labs followed by pcp - she is on pravastatin   4. HTN - often clinic bp's higher than home bp's  - home bp's 140s/60s   5. Lung cancer - followed by oncology, listed as stage I squamous cell - lobectomy in 2019 left side and right side 2020   6. Enlarged pulmonary artery/pulmonic trunk - noted on 06/2021 CT scan, also with signs of emphysema - 2019 echo could not estiamted PASP, normal RV. She did have grade II dd, LVEF 65-70%   08/2019 PFTs: moderate obstruction, moderate diffusion defect 08/2021 echo LVEF 70-75%, grade II dd, normal RV, PASP 38, mild MR  7. 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   Dysphagia    Dysrhythmia    History of palpatations   GERD (gastroesophageal reflux disease)    INGESTION    Heart palpitations    Hiatal hernia    Hyperlipidemia    Interstitial cystitis    Iron deficiency 04/28/2020   Lung cancer, lower lobe ([place]) 07/21/2018   Left lower lobe, stage Ia squamous cell carcinoma   MALT (mucosa associated lymphoid tissue) ([facility])    gastric   Sinusitis      Allergies  Allergen Reactions   Elemental Sulfur Hives     Current Outpatient Medications  Medication Sig Dispense Refill   acetaminophen (TYLENOL) 325 MG tablet Take 650 mg by mouth every 6 (six) hours as needed for moderate pain or headache.     apixaban (ELIQUIS) 5 MG TABS tablet TAKE 1 TABLET BY MOUTH  TWICE DAILY 180 tablet 1   citalopram (CELEXA) 20 MG tablet Take  20 mg by mouth daily.      losartan (COZAAR) 100 MG tablet Take 1 tablet (100 mg total) by mouth daily. 90 tablet 3   metFORMIN (GLUCOPHAGE-XR) 500 MG 24 hr tablet Take 500 mg by mouth daily with breakfast.     metoprolol tartrate (LOPRESSOR) 25 MG tablet TAKE 1 TABLET BY MOUTH TWICE A DAY (Patient taking differently: Take 25 mg by mouth 2 (two) times daily.) 180 tablet 0   Multiple Vitamin (MULTIVITAMIN WITH MINERALS) TABS tablet Take 1 tablet by mouth daily.     pantoprazole (PROTONIX) 40 MG tablet Take 1 tablet (40 mg total) by mouth daily. 90 tablet 3   pioglitazone (ACTOS) 15 MG tablet Take 15 mg by mouth daily.     Polyvinyl Alcohol-Povidone PF 1.4-0.6 % SOLN Place 1 drop into both eyes as needed (Dry eyes).      pravastatin (PRAVACHOL) 40 MG tablet Take 40 mg by mouth at bedtime.      No current facility-administered medications for this visit.     Past Surgical History:  Procedure Laterality Date   ABDOMINAL HYSTERECTOMY     BIOPSY  09/01/2016   Procedure: BIOPSY;  Surgeon: Daneil Dolin, MD;  [facility];  Service: Endoscopy;;  gastric   BIOPSY  11/29/2018   Procedure: BIOPSY;  Surgeon: Daneil Dolin, MD;  [facility];  Service: Endoscopy;;  right colon   BLADDER SURGERY     CATARACT EXTRACTION W/ INTRAOCULAR LENS  IMPLANT, BILATERAL  2015   CATARACT EXTRACTION, BILATERAL     COLONOSCOPY     Dr. Lindalou Hose 2009: Normal per PCP notes   COLONOSCOPY N/A 11/29/2018   [place]: Diverticulosis, 3 polyps removed.  No evidence of colitis.  Tubular adenomas.  No future surveillance colonoscopies recommended due to age.   ESOPHAGOGASTRODUODENOSCOPY     RMR: Prominant Schzgzki ring/component of peptic stricture status post dilation and disruption as described above, otherwise norma esophagus, moderate-sized hiatal hernia, antal pyloric channel, and posterier bulbar erosions, otherwise unremarkable stomach, D1 and D2 . Inflammatory findings on the stomach and duodenum  will likely be related to aspirin effect. We need to rule out Helicobacter pylor   ESOPHAGOGASTRODUODENOSCOPY N/A 03/10/2015   Dr. 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 Pathology with lymphoid population of stomach, slight atypia   ESOPHAGOGASTRODUODENOSCOPY N/A 02/03/2016   Dr. Gala Romney: Schatzki ring noted at GE junction, dilated with 8 and then 15 French Maloney dilator. Large hiatal hernia. 6 x 7 cm nodular geographically ulcerated mucosa, biopsy c/w MALToma   ESOPHAGOGASTRODUODENOSCOPY N/A 04/08/2016   Dr. Gala Romney: moderate Schatzki's ring/web s/p dilation, large hiatal hernia, localized area of gastric lymphoma visualized and appeared to be much improved. normal second portion of the duodenum. No specimens collected. Esophageal lumen notably tighted up significantly since her dilation in April of this year.    ESOPHAGOGASTRODUODENOSCOPY N/A 08/04/2016   Procedure: ESOPHAGOGASTRODUODENOSCOPY (EGD);  Surgeon: Daneil Dolin, MD;  [facility];  Service: Endoscopy;  Laterality: N/A;  7:30 am   ESOPHAGOGASTRODUODENOSCOPY N/A 09/01/2016   Procedure: ESOPHAGOGASTRODUODENOSCOPY (EGD);  Surgeon: Daneil Dolin, MD;  [facility];  Service: Endoscopy;  Laterality: N/A;  830    ESOPHAGOGASTRODUODENOSCOPY N/A 05/10/2017   Dr. Gala Romney: Moderate Schatzki ring at the GE junction, status post dilation with 49 [place].  Medium sized hiatal hernia.  Few localized erosions in the gastric antrum.  Stomach biopsy showed chronic gastritis, no H. pylori.  No atypical lymphoid infiltrates or other features of lymphoproliferative process   ESOPHAGOGASTRODUODENOSCOPY N/A 06/13/2018   Dr. Gala Romney: Schatzki ring status post dilation.  Large hiatal hernia.  Focal area 4 x 4 cm along the greater curvature, somewhat erythematous and thickened mucosa, biopsy benign.  Next EGD in August 2020.   ESOPHAGOGASTRODUODENOSCOPY N/A 01/14/2021   Procedure:  ESOPHAGOGASTRODUODENOSCOPY (EGD);  Surgeon: Daneil Dolin, MD;  [facility];  Service: Endoscopy;  Laterality: N/A;  AM   EYE SURGERY     INTERCOSTAL NERVE BLOCK Right 09/10/2019   Procedure: Intercostal Nerve Block;  Surgeon: [name], MD;  Location: [facility];  Service: Thoracic;  Laterality: Right;   LOBECTOMY Left 05/19/2018   Procedure: LEFT LOWER LOBECTOMY;  Surgeon: [name], MD;  Location: [place];  Service: Thoracic;  Laterality: Left;   LYMPH NODE DISSECTION Right 09/10/2019   Procedure: Lymph Node Dissection;  Surgeon: [name], MD;  Location: [place];  Service: Thoracic;  Laterality: Right;   MALONEY DILATION N/A 03/10/2015   Procedure: Keturah Shavers;  Surgeon: Daneil Dolin, MD;  [facility];  Service: Endoscopy;  Laterality: N/A;   MALONEY DILATION N/A 02/03/2016   Procedure: MALONEY DILATION;  Surgeon: Daneil Dolin, MD;  [facility];  Service: Endoscopy;  Laterality: N/A;   MALONEY DILATION N/A 04/08/2016   Procedure: Venia Minks DILATION;  Surgeon: Daneil Dolin, MD;  [facility];  Service: Endoscopy;  Laterality: N/A;   MALONEY DILATION N/A 05/10/2017   Procedure: Venia Minks DILATION;  Surgeon: Daneil Dolin, MD;  [facility];  Service: Endoscopy;  Laterality: N/A;   MALONEY DILATION N/A 06/13/2018   Procedure: Venia Minks DILATION;  Surgeon: Daneil Dolin, MD;  [facility];  Service: Endoscopy;  Laterality: N/A;   MALONEY DILATION N/A 01/14/2021   Procedure: Venia Minks DILATION;  Surgeon: Daneil Dolin, MD;  [facility];  Service: Endoscopy;  Laterality: N/A;   POLYPECTOMY  11/29/2018   Procedure: POLYPECTOMY;  Surgeon: Daneil Dolin, MD;  [facility];  Service: Endoscopy;;   VIDEO ASSISTED THORACOSCOPY (VATS)/ LOBECTOMY Right 09/10/2019   Procedure: VIDEO ASSISTED THORACOSCOPY (VATS)/RIGHT LOWER LOBECTOMY;  Surgeon: [name], MD;  Location: [place];   Service: Thoracic;  Laterality: Right;   VIDEO ASSISTED THORACOSCOPY (VATS)/WEDGE RESECTION Left 05/19/2018   Procedure: VIDEO ASSISTED THORACOSCOPY (VATS)/WEDGE RESECTION;  Surgeon: [name], MD;  Location: [place];  Service: Thoracic;  Laterality: Left;     Allergies  Allergen Reactions   Elemental Sulfur Hives      Family History  Problem Relation Age of Onset   Heart attack Father    Dementia Father    Diabetes Sister    Diabetes Brother    Diabetes Sister    Colon cancer Neg Hx      Social History Ms. Detert reports that she has been smoking cigarettes. She has a 28.50 pack-year smoking history. She has never used smokeless tobacco. Ms. Earnhardt reports no history of alcohol use.   Review of Systems CONSTITUTIONAL: No weight loss, fever, chills, weakness or fatigue.  HEENT: Eyes: No visual loss, blurred vision, double vision or yellow sclerae.No hearing loss, sneezing, congestion, runny nose or sore throat.  SKIN: No rash or itching.  CARDIOVASCULAR: per hpi RESPIRATORY: No shortness of breath, cough or sputum.  GASTROINTESTINAL: No anorexia, nausea, vomiting or diarrhea. No abdominal pain or blood.  GENITOURINARY: No burning on urination, no polyuria NEUROLOGICAL: No headache, dizziness, syncope, paralysis, ataxia, numbness or tingling in the extremities. No change in bowel or bladder control.  MUSCULOSKELETAL: No muscle, back pain, joint pain or stiffness.  LYMPHATICS: No enlarged nodes. No history of splenectomy.  PSYCHIATRIC: No history of depression or anxiety.  ENDOCRINOLOGIC: No reports of sweating, cold or heat intolerance. No polyuria or polydipsia.  Marland Kitchen   Physical Examination Today's Vitals   01/11/22 1003  BP: (!) 156/82  Pulse: (!) 59  SpO2: 95%  Weight: 183 lb (83 kg)  Height: 5' 6.5" (1.689 m)   Body mass index is 29.09 kg/m.  Gen: resting comfortably, no acute distress HEENT: no scleral icterus, pupils equal round and reactive, no  palptable cervical adenopathy,  CV: RRR, no m/r/g no jvd Resp: Clear to auscultation bilaterally GI: abdomen is soft, non-tender, non-distended, normal bowel sounds, no hepatosplenomegaly MSK: extremities are warm, no edema.  Skin: warm, no rash Neuro:  no focal deficits Psych: appropriate affect   Diagnostic Studies  ECHO 2019: - Left ventricle: The cavity size was normal. Wall thickness was   increased in a pattern of moderate LVH. Systolic function was   vigorous. The estimated ejection fraction was in the range of 65%  to 70%. Wall motion was normal; there were no regional wall   motion abnormalities. Features are consistent with a pseudonormal   left ventricular filling pattern, with concomitant abnormal   relaxation and increased filling pressure (grade 2 diastolic   dysfunction). - Aortic valve: Mildly calcified annulus. Trileaflet. - Mitral valve: There was mild regurgitation. - Left atrium: The atrium was moderately dilated. - Right atrium: Central venous pressure (est): 3 mm Hg. - Atrial septum: No defect or patent foramen ovale was identified. - Tricuspid valve: There was trivial regurgitation. - Pulmonary arteries: Systolic pressure could not be accurately   estimated. - Pericardium, extracardiac: There was no pericardial effusion   08/2021 echo 1. Left ventricular ejection fraction, by estimation, is 70 to 75%. The  left ventricle has hyperdynamic function. The left ventricle has no  regional wall motion abnormalities. There is moderate concentric left  ventricular hypertrophy. Left ventricular  diastolic parameters are consistent with Grade II diastolic dysfunction  (pseudonormalization).   2. Right ventricular systolic function is normal. The right ventricular  size is normal. There is mildly elevated pulmonary artery systolic  pressure. The estimated right ventricular systolic pressure is 78.6 mmHg.   3. Left atrial size was mildly dilated.   4. The mitral  valve is abnormal. Mild mitral valve regurgitation.   5. The aortic valve is tricuspid. Aortic valve regurgitation is not  visualized. Mild aortic valve sclerosis is present, with no evidence of  aortic valve stenosis.   6. The inferior vena cava is normal in size with greater than 50%  respiratory variability, suggesting right atrial pressure of 3 mmHg.   Comparison(s): Prior images unable to be directly viewed.   Assessment and Plan  1. PAF/acquired thrombophilia - no symptoms, continue current meds including eliquis for stroke prevention    2. HTN -above goal, start norvasc 5mg  daily   3. Hyperlipidemia - continue statin, request labs from pcp   4. Pulmonary HTN - mild by echo with normal RV, some enlargement of PA on prior CT scans - likely secondary to COPD by PFTs, grade II diastolic dysfunction - no further workup indicated.  5. Coronary atherosclerosis - noted on CT scan - no significant symptoms. She is on statin, no ASA since on eliquis.  - in absence of symptoms with advanced age would just focuse on risk factor modification.      Arnoldo Lenis, M.D., F.A.C.C.

## 2022-01-26 ENCOUNTER — Other Ambulatory Visit: Payer: Self-pay | Admitting: Cardiology

## 2022-01-27 ENCOUNTER — Other Ambulatory Visit (HOSPITAL_COMMUNITY): Payer: Self-pay | Admitting: *Deleted

## 2022-03-03 DIAGNOSIS — L570 Actinic keratosis: Secondary | ICD-10-CM | POA: Diagnosis not present

## 2022-03-03 DIAGNOSIS — Z85828 Personal history of other malignant neoplasm of skin: Secondary | ICD-10-CM | POA: Diagnosis not present

## 2022-03-16 DIAGNOSIS — E7849 Other hyperlipidemia: Secondary | ICD-10-CM | POA: Diagnosis not present

## 2022-03-16 DIAGNOSIS — C801 Malignant (primary) neoplasm, unspecified: Secondary | ICD-10-CM | POA: Diagnosis not present

## 2022-03-16 DIAGNOSIS — E1122 Type 2 diabetes mellitus with diabetic chronic kidney disease: Secondary | ICD-10-CM | POA: Diagnosis not present

## 2022-03-16 DIAGNOSIS — E1165 Type 2 diabetes mellitus with hyperglycemia: Secondary | ICD-10-CM | POA: Diagnosis not present

## 2022-03-16 DIAGNOSIS — I10 Essential (primary) hypertension: Secondary | ICD-10-CM | POA: Diagnosis not present

## 2022-03-16 DIAGNOSIS — E782 Mixed hyperlipidemia: Secondary | ICD-10-CM | POA: Diagnosis not present

## 2022-03-18 DIAGNOSIS — E1165 Type 2 diabetes mellitus with hyperglycemia: Secondary | ICD-10-CM | POA: Diagnosis not present

## 2022-03-18 DIAGNOSIS — K449 Diaphragmatic hernia without obstruction or gangrene: Secondary | ICD-10-CM | POA: Diagnosis not present

## 2022-03-18 DIAGNOSIS — C3490 Malignant neoplasm of unspecified part of unspecified bronchus or lung: Secondary | ICD-10-CM | POA: Diagnosis not present

## 2022-03-18 DIAGNOSIS — I700 Atherosclerosis of aorta: Secondary | ICD-10-CM | POA: Diagnosis not present

## 2022-03-18 DIAGNOSIS — I10 Essential (primary) hypertension: Secondary | ICD-10-CM | POA: Diagnosis not present

## 2022-03-18 DIAGNOSIS — C884 Extranodal marginal zone B-cell lymphoma of mucosa-associated lymphoid tissue [MALT-lymphoma]: Secondary | ICD-10-CM | POA: Diagnosis not present

## 2022-03-18 DIAGNOSIS — M25472 Effusion, left ankle: Secondary | ICD-10-CM | POA: Diagnosis not present

## 2022-03-18 DIAGNOSIS — M791 Myalgia, unspecified site: Secondary | ICD-10-CM | POA: Diagnosis not present

## 2022-03-29 ENCOUNTER — Other Ambulatory Visit: Payer: Self-pay | Admitting: Cardiology

## 2022-03-30 NOTE — Telephone Encounter (Signed)
Prescription refill request for Eliquis received. Indication: PAF Last office visit: 01/11/22  Zandra Abts MD Scr: 0.93 on 12/17/21 Age:  81 Weight: 83 kg  Based on above findings Eliquis 5mg  twice daily is the appropriate dose.  Refill approved.

## 2022-06-09 ENCOUNTER — Other Ambulatory Visit: Payer: Self-pay | Admitting: Cardiology

## 2022-06-09 NOTE — Telephone Encounter (Signed)
Prescription refill request for Eliquis received. Indication: PAF Last office visit: 01/11/22  Zandra Abts MD Scr: 0.93 on 12/17/21 Age: 81 Weight: 83kg  Based on above findings Eliquis 5mg  twice daily is the appropriate dose.  Refill approved.

## 2022-06-17 ENCOUNTER — Ambulatory Visit (HOSPITAL_COMMUNITY)
Admission: RE | Admit: 2022-06-17 | Discharge: 2022-06-17 | Disposition: A | Discharge status: Home / Self Care | Payer: Medicare Other | Source: Ambulatory Visit | Attending: Hematology | Admitting: Hematology

## 2022-06-17 ENCOUNTER — Inpatient Hospital Stay: Payer: Medicare Other | Attending: Hematology

## 2022-06-17 DIAGNOSIS — Z8572 Personal history of non-Hodgkin lymphomas: Secondary | ICD-10-CM | POA: Diagnosis not present

## 2022-06-17 DIAGNOSIS — J439 Emphysema, unspecified: Secondary | ICD-10-CM | POA: Diagnosis not present

## 2022-06-17 DIAGNOSIS — E559 Vitamin D deficiency, unspecified: Secondary | ICD-10-CM | POA: Insufficient documentation

## 2022-06-17 DIAGNOSIS — C3490 Malignant neoplasm of unspecified part of unspecified bronchus or lung: Secondary | ICD-10-CM | POA: Diagnosis not present

## 2022-06-17 DIAGNOSIS — E611 Iron deficiency: Secondary | ICD-10-CM

## 2022-06-17 DIAGNOSIS — C3432 Malignant neoplasm of lower lobe, left bronchus or lung: Secondary | ICD-10-CM | POA: Insufficient documentation

## 2022-06-17 DIAGNOSIS — Z87891 Personal history of nicotine dependence: Secondary | ICD-10-CM | POA: Diagnosis not present

## 2022-06-17 DIAGNOSIS — Z923 Personal history of irradiation: Secondary | ICD-10-CM | POA: Diagnosis not present

## 2022-06-17 DIAGNOSIS — C3431 Malignant neoplasm of lower lobe, right bronchus or lung: Secondary | ICD-10-CM

## 2022-06-17 DIAGNOSIS — I700 Atherosclerosis of aorta: Secondary | ICD-10-CM | POA: Diagnosis not present

## 2022-06-17 DIAGNOSIS — R911 Solitary pulmonary nodule: Secondary | ICD-10-CM | POA: Diagnosis not present

## 2022-06-17 LAB — COMPREHENSIVE METABOLIC PANEL
ALT: 16 U/L (ref 0–44)
AST: 22 U/L (ref 15–41)
Albumin: 3.7 g/dL (ref 3.5–5.0)
Alkaline Phosphatase: 45 U/L (ref 38–126)
Anion gap: 9 (ref 5–15)
BUN: 12 mg/dL (ref 8–23)
CO2: 27 mmol/L (ref 22–32)
Calcium: 9.1 mg/dL (ref 8.9–10.3)
Chloride: 102 mmol/L (ref 98–111)
Creatinine, Ser: 0.84 mg/dL (ref 0.44–1.00)
GFR, Estimated: >60 mL/min (ref >60)
Glucose, Bld: 153 mg/dL — ABNORMAL HIGH (ref 70–99)
Potassium: 3.7 mmol/L (ref 3.5–5.1)
Sodium: 138 mmol/L (ref 135–145)
Total Bilirubin: 0.5 mg/dL (ref 0.3–1.2)
Total Protein: 7.3 g/dL (ref 6.5–8.1)

## 2022-06-17 LAB — IRON AND TIBC
Iron: 85 ug/dL (ref 28–170)
Saturation Ratios: 24 % (ref 10.4–31.8)
TIBC: 351 ug/dL (ref 250–450)
UIBC: 266 ug/dL

## 2022-06-17 LAB — CBC WITH DIFFERENTIAL/PLATELET
Abs Immature Granulocytes: 0.02 10*3/uL (ref 0.00–0.07)
Basophils Absolute: 0.1 10*3/uL (ref 0.0–0.1)
Basophils Relative: 1 %
Eosinophils Absolute: 0.2 10*3/uL (ref 0.0–0.5)
Eosinophils Relative: 3 %
HCT: 38 % (ref 36.0–46.0)
Hemoglobin: 12.7 g/dL (ref 12.0–15.0)
Immature Granulocytes: 0 %
Lymphocytes Relative: 30 %
Lymphs Abs: 1.9 10*3/uL (ref 0.7–4.0)
MCH: 30.7 pg (ref 26.0–34.0)
MCHC: 33.4 g/dL (ref 30.0–36.0)
MCV: 91.8 fL (ref 80.0–100.0)
Monocytes Absolute: 0.5 10*3/uL (ref 0.1–1.0)
Monocytes Relative: 8 %
Neutro Abs: 3.6 10*3/uL (ref 1.7–7.7)
Neutrophils Relative %: 58 %
Platelets: 244 10*3/uL (ref 150–400)
RBC: 4.14 MIL/uL (ref 3.87–5.11)
RDW: 14.3 % (ref 11.5–15.5)
WBC: 6.3 10*3/uL (ref 4.0–10.5)
nRBC: 0 % (ref 0.0–0.2)

## 2022-06-17 LAB — FERRITIN: Ferritin: 38 ng/mL (ref 11–307)

## 2022-06-17 LAB — LACTATE DEHYDROGENASE: LDH: 119 U/L (ref 98–192)

## 2022-06-17 LAB — VITAMIN D 25 HYDROXY (VIT D DEFICIENCY, FRACTURES): Vit D, 25-Hydroxy: 38.85 ng/mL (ref 30–100)

## 2022-06-24 ENCOUNTER — Encounter: Payer: Self-pay | Admitting: Hematology

## 2022-06-24 ENCOUNTER — Inpatient Hospital Stay: Payer: Medicare Other | Admitting: Hematology

## 2022-06-24 VITALS — HR 73 | Temp 98.7°F | Resp 18 | Wt 169.6 lb

## 2022-06-24 DIAGNOSIS — C3431 Malignant neoplasm of lower lobe, right bronchus or lung: Secondary | ICD-10-CM

## 2022-06-24 DIAGNOSIS — Z923 Personal history of irradiation: Secondary | ICD-10-CM | POA: Diagnosis not present

## 2022-06-24 DIAGNOSIS — C3432 Malignant neoplasm of lower lobe, left bronchus or lung: Secondary | ICD-10-CM | POA: Diagnosis not present

## 2022-06-24 DIAGNOSIS — Z87891 Personal history of nicotine dependence: Secondary | ICD-10-CM | POA: Diagnosis not present

## 2022-06-24 DIAGNOSIS — E559 Vitamin D deficiency, unspecified: Secondary | ICD-10-CM | POA: Diagnosis not present

## 2022-06-24 DIAGNOSIS — Z8572 Personal history of non-Hodgkin lymphomas: Secondary | ICD-10-CM | POA: Diagnosis not present

## 2022-06-24 NOTE — Patient Instructions (Signed)
[employer] at [facility] Discharge Instructions   You were seen and examined today by Dr. Delton Coombes.  He reviewed the results of your CT scan. There was a spot on your left lung that is not suspicious for cancer. We will repeat scan in 3 months to see if this area is gone.   Return as scheduled.    Thank you for choosing [place] at [facility] to provide your oncology and hematology care.  To afford each patient quality time with our provider, please arrive at least 15 minutes before your scheduled appointment time.   If you have a lab appointment with the [place] please come in thru the Main Entrance and check in at the main information desk.  You need to re-schedule your appointment should you arrive 10 or more minutes late.  We strive to give you quality time with our providers, and arriving late affects you and other patients whose appointments are after yours.  Also, if you no show three or more times for appointments you may be dismissed from the clinic at the providers discretion.     Again, thank you for choosing [facility].  Our hope is that these requests will decrease the amount of time that you wait before being seen by our physicians.       _____________________________________________________________  Should you have questions after your visit to [facility], please contact our office at [phone] and follow the prompts.  Our office hours are 8:00 a.m. and 4:30 p.m. Monday - Friday.  Please note that voicemails left after 4:00 p.m. may not be returned until the following business day.  We are closed weekends and major holidays.  You do have access to a nurse 24-7, just call the main number to the clinic [phone] and do not press any options, hold on the line and a nurse will answer the phone.    For prescription refill requests, have your pharmacy contact our office and allow 72 hours.     Due to Covid, you will need to wear a mask upon entering the hospital. If you do not have a mask, a mask will be given to you at the Main Entrance upon arrival. For doctor visits, patients may have 1 support person age 91 or older with them. For treatment visits, patients can not have anyone with them due to social distancing guidelines and our immunocompromised population.

## 2022-06-24 NOTE — Progress Notes (Signed)
Monica [name], Monica [name] [id number]   CLINIC:  Medical Oncology/Hematology  PCP:  Manon Hilding, MD [address] [place] [address] [phone]   REASON FOR VISIT:  Follow-up for left and right lung cancer  PRIOR THERAPY:  1. Left lower lobectomy on 05/19/2018. 2. VATS with right lower lobectomy on 09/10/2019.  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 Gala Romney, abnormal gastric mucosa. Pathology with EXTRANODAL Marginal zone lymphoma. NEGATIVE for H. Pylori   03/03/2016 Miscellaneous   H pylori stool antigen NEGATIVE   03/03/2016 PET scan   Focal area of hypermetabolism and wall thickening involving the body antral junction region of the stomach c/w history of lymphoma. 5.5 mm LLL pulm nodule not hypermetabolic. Non contrast chest CT in 6 month   03/18/2016 - 04/08/2016 Radiation Therapy   The gastric tumor received 30 Gy in 15 fractions of 2 Gy   Lung cancer ([place])  05/19/2018 Initial Diagnosis   Lung cancer ([place])   06/06/2018 Cancer Staging   Staging form: Lung, AJCC 8th Edition - Clinical: cT1b, cN0 - Signed by Zoila Shutter, MD on 06/06/2018     CANCER STAGING:  Cancer Staging  Lung cancer [facility]) Staging form: Lung, AJCC 8th Edition - Clinical: cT1b, cN0 - Signed by Zoila Shutter, MD on 06/06/2018  MALToma [facility]) Staging form: Lymphoid Neoplasms, AJCC 6th Edition - Clinical stage from 03/23/2016: Stage I - Signed by Baird Cancer, PA-C on 03/23/2016   INTERVAL HISTORY:  Ms. Monica [name], a 81 y.o. female, returns for follow-up of lung cancer.  Reports decreased energy levels.  Cough and shortness of breath has been stable.  No hemoptysis or chest pains reported.  REVIEW OF SYSTEMS:  Review of Systems  Constitutional:  Positive for fatigue. Negative for appetite change, fever and  unexpected weight change.  HENT:   Negative for trouble swallowing.   Respiratory:  Positive for cough and shortness of breath. Negative for hemoptysis.   Cardiovascular:  Negative for chest pain and palpitations.  Endocrine: Negative for hot flashes.  All other systems reviewed and are negative.   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 Lindalou Hose 2009: Normal per PCP notes   COLONOSCOPY N/A 11/29/2018   [place]: Diverticulosis, 3 polyps removed.  No evidence of colitis.  Tubular adenomas.  No future surveillance colonoscopies recommended due to age.   ESOPHAGOGASTRODUODENOSCOPY     RMR: Prominant Schzgzki ring/component of peptic stricture status post dilation and disruption as described above, otherwise norma esophagus, moderate-sized hiatal hernia, antal pyloric channel, and posterier bulbar erosions, otherwise unremarkable stomach, D1 and D2 . Inflammatory findings  on the stomach and duodenum will likely be related to aspirin effect. We need to rule out Helicobacter pylor   ESOPHAGOGASTRODUODENOSCOPY N/A 03/10/2015   Dr. 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 Pathology with lymphoid population of stomach, slight atypia   ESOPHAGOGASTRODUODENOSCOPY N/A 02/03/2016   Dr. Gala Romney: Schatzki ring noted at GE junction, dilated with 66 and then 8 French Maloney dilator. Large hiatal hernia. 6 x 7 cm nodular geographically ulcerated mucosa, biopsy c/w MALToma   ESOPHAGOGASTRODUODENOSCOPY N/A 04/08/2016   Dr. Gala Romney: moderate Schatzki's ring/web s/p dilation, large hiatal hernia, localized area of gastric lymphoma visualized and appeared to be much improved. normal second portion of the duodenum. No specimens collected. Esophageal lumen notably tighted up significantly since her dilation in April of this year.    ESOPHAGOGASTRODUODENOSCOPY N/A 08/04/2016   Procedure: ESOPHAGOGASTRODUODENOSCOPY (EGD);  Surgeon: Daneil Dolin, MD;  [facility];  Service: Endoscopy;  Laterality: N/A;  7:30 am   ESOPHAGOGASTRODUODENOSCOPY N/A 09/01/2016   Procedure: ESOPHAGOGASTRODUODENOSCOPY (EGD);  Surgeon: Daneil Dolin, MD;  [facility];  Service: Endoscopy;  Laterality: N/A;  830    ESOPHAGOGASTRODUODENOSCOPY N/A 05/10/2017   Dr. Gala Romney: Moderate Schatzki ring at the GE junction, status post dilation with 23 [place].  Medium sized hiatal hernia.  Few localized erosions in the gastric antrum.  Stomach biopsy showed chronic gastritis, no H. pylori.  No atypical lymphoid infiltrates or other features of lymphoproliferative process   ESOPHAGOGASTRODUODENOSCOPY N/A 06/13/2018   Dr. Gala Romney: Schatzki ring status post dilation.  Large hiatal hernia.  Focal area 4 x 4 cm along the greater curvature, somewhat erythematous and thickened mucosa, biopsy benign.  Next EGD in August 2020.   ESOPHAGOGASTRODUODENOSCOPY N/A  01/14/2021   Procedure: ESOPHAGOGASTRODUODENOSCOPY (EGD);  Surgeon: Daneil Dolin, MD;  [facility];  Service: Endoscopy;  Laterality: N/A;  AM   EYE SURGERY     INTERCOSTAL NERVE BLOCK Right 09/10/2019   Procedure: Intercostal Nerve Block;  Surgeon: [name], MD;  Location: [place];  Service: Thoracic;  Laterality: Right;   LOBECTOMY Left 05/19/2018   Procedure: LEFT LOWER LOBECTOMY;  Surgeon: [name], MD;  Location: [place];  Service: Thoracic;  Laterality: Left;   LYMPH NODE DISSECTION Right 09/10/2019   Procedure: Lymph Node Dissection;  Surgeon: [name], MD;  Location: [place];  Service: Thoracic;  Laterality: Right;   MALONEY DILATION N/A 03/10/2015   Procedure: Keturah Shavers;  Surgeon: Daneil Dolin, MD;  [facility];  Service: Endoscopy;  Laterality: N/A;   MALONEY DILATION N/A 02/03/2016   Procedure: Venia Minks DILATION;  Surgeon: Daneil Dolin, MD;  [facility];  Service: Endoscopy;  Laterality: N/A;   MALONEY DILATION N/A 04/08/2016   Procedure: Venia Minks DILATION;  Surgeon: Daneil Dolin, MD;  [facility];  Service: Endoscopy;  Laterality: N/A;  MALONEY DILATION N/A 05/10/2017   Procedure: Venia Minks DILATION;  Surgeon: Daneil Dolin, MD;  [facility];  Service: Endoscopy;  Laterality: N/A;   MALONEY DILATION N/A 06/13/2018   Procedure: Venia Minks DILATION;  Surgeon: Daneil Dolin, MD;  [facility];  Service: Endoscopy;  Laterality: N/A;   MALONEY DILATION N/A 01/14/2021   Procedure: Venia Minks DILATION;  Surgeon: Daneil Dolin, MD;  [facility];  Service: Endoscopy;  Laterality: N/A;   POLYPECTOMY  11/29/2018   Procedure: POLYPECTOMY;  Surgeon: Daneil Dolin, MD;  [facility];  Service: Endoscopy;;   VIDEO ASSISTED THORACOSCOPY (VATS)/ LOBECTOMY Right 09/10/2019   Procedure: VIDEO ASSISTED THORACOSCOPY (VATS)/RIGHT LOWER LOBECTOMY;  Surgeon: [name], MD;  Location: [facility];  Service: Thoracic;  Laterality: Right;   VIDEO ASSISTED THORACOSCOPY (VATS)/WEDGE RESECTION Left 05/19/2018   Procedure: VIDEO ASSISTED THORACOSCOPY (VATS)/WEDGE RESECTION;  Surgeon: [name], MD;  Location: [place];  Service: Thoracic;  Laterality: Left;    SOCIAL HISTORY:  Social History   Socioeconomic History   Marital status: Married    Spouse name: Not on file   Number of children: Not on file   Years of education: Not on file   Highest education level: Not on file  Occupational History   Not on file  Tobacco Use   Smoking status: Some Days    Packs/day: 0.50    Years: 57.00    Total pack years: 28.50    Types: Cigarettes    Last attempt to quit: 09/24/2019    Years since quitting: 2.7   Smokeless tobacco: Never   Tobacco comments:    trying to quit, wearing patch  Vaping Use   Vaping Use: Never used  Substance and Sexual Activity   Alcohol use: No    Alcohol/week: 0.0 standard drinks of alcohol   Drug use: No   Sexual activity: Not Currently  Other Topics Concern   Not on file  Social History Narrative   Not on file   Social Determinants of Health   Financial Resource Strain: Not on file  Food Insecurity: Not on file  Transportation Needs: Not on file  Physical Activity: Not on file  Stress: Not on file  Social Connections: Not on file  Intimate Partner Violence: Not on file    FAMILY HISTORY:  Family History  Problem Relation Age of Onset   Heart attack Father    Dementia Father    Diabetes Sister    Diabetes Brother    Diabetes Sister    Colon cancer Neg Hx     CURRENT MEDICATIONS:  Current Outpatient Medications  Medication Sig Dispense Refill   acetaminophen (TYLENOL) 325 MG tablet Take 650 mg by mouth every 6 (six) hours as needed for moderate pain or headache.     amLODipine (NORVASC) 5 MG tablet Take 1 tablet (5 mg total) by mouth daily. 90 tablet 3   apixaban (ELIQUIS) 5 MG TABS tablet TAKE 1 TABLET  BY MOUTH TWICE  DAILY 200 tablet 1   citalopram (CELEXA) 20 MG tablet Take 20 mg by mouth daily.      losartan (COZAAR) 100 MG tablet Take 1 tablet (100 mg total) by mouth daily. 90 tablet 3   metFORMIN (GLUCOPHAGE-XR) 500 MG 24 hr tablet Take 500 mg by mouth daily with breakfast.     metoprolol tartrate (LOPRESSOR) 25 MG tablet TAKE 1 TABLET BY MOUTH TWICE A DAY (Patient taking differently: Take 25  mg by mouth 2 (two) times daily.) 180 tablet 0   Multiple Vitamin (MULTIVITAMIN WITH MINERALS) TABS tablet Take 1 tablet by mouth daily.     pantoprazole (PROTONIX) 40 MG tablet Take 1 tablet (40 mg total) by mouth daily. 90 tablet 3   pioglitazone (ACTOS) 15 MG tablet Take 15 mg by mouth daily.     Polyvinyl Alcohol-Povidone PF 1.4-0.6 % SOLN Place 1 drop into both eyes as needed (Dry eyes).      pravastatin (PRAVACHOL) 40 MG tablet Take 40 mg by mouth at bedtime.      OZEMPIC, 0.25 OR 0.5 MG/DOSE, 2 MG/3ML SOPN Inject 2 mg into the skin once a week.     No current facility-administered medications for this visit.    ALLERGIES:  Allergies  Allergen Reactions   Elemental Sulfur Hives    PHYSICAL EXAM:  Performance status (ECOG): 1 - Symptomatic but completely ambulatory  Vitals:   06/24/22 1137  Pulse: 73  Resp: 18  Temp: 98.7 F (37.1 C)  SpO2: 98%   Wt Readings from Last 3 Encounters:  06/24/22 169 lb 9.6 oz (76.9 kg)  01/11/22 183 lb (83 kg)  01/04/22 182 lb 6.4 oz (82.7 kg)   Physical Exam Vitals reviewed.  Constitutional:      Appearance: Normal appearance.  Cardiovascular:     Rate and Rhythm: Normal rate and regular rhythm.     Pulses: Normal pulses.     Heart sounds: Normal heart sounds.  Pulmonary:     Effort: Pulmonary effort is normal.     Breath sounds: Normal breath sounds.  Neurological:     General: No focal deficit present.     Mental Status: She is alert and oriented to person, place, and time.  Psychiatric:        Mood and Affect: Mood normal.         Behavior: Behavior normal.      LABORATORY DATA:  I have reviewed the labs as listed.     Latest Ref Rng & Units 06/17/2022    9:58 AM 12/17/2021    7:52 AM 06/09/2021   10:08 AM  CBC  WBC 4.0 - 10.5 K/uL 6.3  6.0  5.9   Hemoglobin 12.0 - 15.0 g/dL 12.7  12.7  13.1   Hematocrit 36.0 - 46.0 % 38.0  39.3  40.9   Platelets 150 - 400 K/uL 244  233  265       Latest Ref Rng & Units 06/17/2022    9:58 AM 12/17/2021    7:52 AM 06/09/2021   10:08 AM  CMP  Glucose 70 - 99 mg/dL 153  242  269   BUN 8 - 23 mg/dL 12  14  10    Creatinine 0.44 - 1.00 mg/dL 0.84  0.93  0.82   Sodium 135 - 145 mmol/L 138  133  136   Potassium 3.5 - 5.1 mmol/L 3.7  3.7  4.0   Chloride 98 - 111 mmol/L 102  100  100   CO2 22 - 32 mmol/L 27  19  27    Calcium 8.9 - 10.3 mg/dL 9.1  8.9  9.1   Total Protein 6.5 - 8.1 g/dL 7.3  7.1  7.4   Total Bilirubin 0.3 - 1.2 mg/dL 0.5  0.7  0.5   Alkaline Phos 38 - 126 U/L 45  63  63   AST 15 - 41 U/L 22  23  21    ALT 0 - 44 U/L  16  15  17      DIAGNOSTIC IMAGING:  I have independently reviewed the scans and discussed with the patient. CT Chest Wo Contrast  Result Date: 06/18/2022 CLINICAL DATA:  Non-small cell lung cancer (NSCLC), monitor. Left lower lobectomy for squamous cell lung carcinoma in 2019. Right lower lobectomy for squamous cell lung carcinoma in 2020. Restaging. * Tracking Code: BO * EXAM: CT CHEST WITHOUT CONTRAST TECHNIQUE: Multidetector CT imaging of the chest was performed following the standard protocol without IV contrast. RADIATION DOSE REDUCTION: This exam was performed according to the departmental dose-optimization program which includes automated exposure control, adjustment of the mA and/or kV according to patient size and/or use of iterative reconstruction technique. COMPARISON:  12/17/2021 chest CT. FINDINGS: Cardiovascular: Normal heart size. No significant pericardial effusion/thickening. Three-vessel coronary atherosclerosis. Atherosclerotic  nonaneurysmal thoracic aorta. Normal caliber pulmonary arteries. Mediastinum/Nodes: No discrete thyroid nodules. Unremarkable esophagus. No pathologically enlarged axillary, mediastinal or hilar lymph nodes, noting limited sensitivity for the detection of hilar adenopathy on this noncontrast study. Lungs/Pleura: No pneumothorax. No pleural effusion. Status post right lower lobectomy and left lower lobectomy. Severe centrilobular emphysema. No acute consolidative airspace disease or lung masses. Tiny perifissural solid 0.3 cm right pulmonary nodule (series 4/image 85) is stable. New irregular solid nodule measuring 1.0 x 0.5 cm in the medial left upper lobe (series 4/image 93). No additional new significant pulmonary nodules. Upper abdomen: Chronic large hiatal hernia. Calcifications throughout the somewhat atrophic pancreas, unchanged, compatible with chronic pancreatitis. Mild left colonic diverticulosis. Musculoskeletal: No aggressive appearing focal osseous lesions. Moderate to marked thoracic spondylosis. IMPRESSION: 1. New irregular solid 1.0 x 0.5 cm medial left upper lobe pulmonary nodule, indeterminate for malignancy versus inflammatory nodule. Suggest short-term follow-up chest CT in 3 months. PET-CT could be considered at this time, although the average nodule diameter is 0.8 cm, at the lower limits of PET resolution. 2. No additional potential findings of metastatic disease in the chest. 3. Three-vessel coronary atherosclerosis. 4. Chronic large hiatal hernia. 5. Chronic pancreatitis. 6. Aortic Atherosclerosis (ICD10-I70.0) and Emphysema (ICD10-J43.9). These results will be called to the ordering clinician or representative by the Radiologist Assistant, and communication documented in the PACS or [employer]. Electronically Signed   By: Ilona Sorrel M.D.   On: 06/18/2022 13:57     ASSESSMENT:  1.  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 -Adjuvant therapy was not recommended.   2.  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  3.  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  H. pylori negative. -XRT 30 Gray in 15 fractions from 03/18/2016 through 04/18/2016. -EGD biopsy on 06/13/2018 shows mild chronic gastritis and small lymphoid aggregates with no features of lymphoma. -PET scan on 08/13/2019 did not show any abnormal uptake associated with abdominal organs.  No lymphadenopathy. -Colonoscopy on 11/29/2018 showed diverticulosis in the sigmoid colon with 2 subcentimeter polyps in sigmoid colon.  PLAN:  1.  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  CT chest without contrast on 06/17/2022 reviewed by me showed new irregular solid 1 x 0.5 cm medial left upper lobe lung nodule, indeterminate. -  Reportedly started on Ozempic 1 and half months ago and lost 10 pounds.  Recommend CT chest with contrast in 3 months.   2.  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  We will obtain CT scan.     3.  Gastric marginal zone lymphoma: - Symptoms or palpable adenopathy.  LDH is normal.   4.  Low vitamin D levels: - Continue vitamin D daily.  Vitamin D level is normal.   Orders placed this encounter:  Orders Placed This Encounter  Procedures   [place], [place] [phone]

## 2022-06-30 DIAGNOSIS — N3020 Other chronic cystitis without hematuria: Secondary | ICD-10-CM | POA: Diagnosis not present

## 2022-07-14 ENCOUNTER — Ambulatory Visit: Payer: Medicare Other | Admitting: Cardiology

## 2022-07-14 DIAGNOSIS — E1122 Type 2 diabetes mellitus with diabetic chronic kidney disease: Secondary | ICD-10-CM | POA: Diagnosis not present

## 2022-07-14 DIAGNOSIS — E782 Mixed hyperlipidemia: Secondary | ICD-10-CM | POA: Diagnosis not present

## 2022-07-14 DIAGNOSIS — J069 Acute upper respiratory infection, unspecified: Secondary | ICD-10-CM | POA: Diagnosis not present

## 2022-07-14 DIAGNOSIS — R0602 Shortness of breath: Secondary | ICD-10-CM | POA: Diagnosis not present

## 2022-07-14 DIAGNOSIS — R5383 Other fatigue: Secondary | ICD-10-CM | POA: Diagnosis not present

## 2022-07-14 DIAGNOSIS — F17200 Nicotine dependence, unspecified, uncomplicated: Secondary | ICD-10-CM | POA: Diagnosis not present

## 2022-07-14 NOTE — Progress Notes (Deleted)
Clinical Summary Monica [name] is a 81 y.o.female  seen today for follow up of the following medical problems.    1. PAF - has not tolerated amio in the past     -very rare palpitaiotns - no bleeding on eliquis - compliant withmeds     2. History of lung cancer - s/p resection   3. Hyperlipidemia - labs followed by pcp - she is on pravastatin   4. HTN - often clinic bp's higher than home bp's   - home bp's 140s/60s   5. Lung cancer - followed by oncology, listed as stage I squamous cell - lobectomy in 2019 left side and right side 2020   6. Enlarged pulmonary artery/pulmonic trunk - noted on 06/2021 CT scan, also with signs of emphysema - 2019 echo could not estiamted PASP, normal RV. She did have grade II dd, LVEF 65-70%   08/2019 PFTs: moderate obstruction, moderate diffusion defect 08/2021 echo LVEF 70-75%, grade II dd, normal RV, PASP 38, mild MR   7. 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     amLODipine (NORVASC) 5 MG tablet Take 1 tablet (5 mg total) by mouth daily. 90 tablet 3   apixaban (ELIQUIS) 5 MG TABS  tablet TAKE 1 TABLET BY MOUTH TWICE  DAILY 200 tablet 1   citalopram (CELEXA) 20 MG tablet Take 20 mg by mouth daily.      losartan (COZAAR) 100 MG tablet Take 1 tablet (100 mg total) by mouth daily. 90 tablet 3   metFORMIN (GLUCOPHAGE-XR) 500 MG 24 hr tablet Take 500 mg by mouth daily with breakfast.     metoprolol tartrate (LOPRESSOR) 25 MG tablet TAKE 1 TABLET BY MOUTH TWICE A DAY (Patient taking differently: Take 25 mg by mouth 2 (two) times daily.) 180 tablet 0   Multiple Vitamin (MULTIVITAMIN WITH MINERALS) TABS tablet Take 1 tablet by mouth daily.     OZEMPIC, 0.25 OR 0.5 MG/DOSE, 2 MG/3ML SOPN Inject 2 mg into the skin once a week.     pantoprazole (PROTONIX) 40 MG tablet Take 1 tablet (40 mg total) by mouth daily. 90 tablet 3   pioglitazone (ACTOS) 15 MG tablet Take 15 mg by mouth daily.     Polyvinyl Alcohol-Povidone PF 1.4-0.6 % SOLN Place 1 drop into both eyes as needed (Dry eyes).      pravastatin (PRAVACHOL) 40 MG tablet Take 40 mg by mouth at bedtime.      No  current facility-administered medications for this visit.     Past Surgical History:  Procedure Laterality Date   ABDOMINAL HYSTERECTOMY     BIOPSY  09/01/2016   Procedure: BIOPSY;  Surgeon: Daneil Dolin, MD;  [facility];  Service: Endoscopy;;  gastric   BIOPSY  11/29/2018   Procedure: BIOPSY;  Surgeon: Daneil Dolin, MD;  [facility];  Service: Endoscopy;;  right colon   BLADDER SURGERY     CATARACT EXTRACTION W/ INTRAOCULAR LENS  IMPLANT, BILATERAL  2015   CATARACT EXTRACTION, BILATERAL     COLONOSCOPY     Dr. Lindalou Hose 2009: Normal per PCP notes   COLONOSCOPY N/A 11/29/2018   [place]: Diverticulosis, 3 polyps removed.  No evidence of colitis.  Tubular adenomas.  No future surveillance colonoscopies recommended due to age.   ESOPHAGOGASTRODUODENOSCOPY     RMR: Prominant Schzgzki ring/component of peptic stricture status post dilation and disruption as described above, otherwise norma  esophagus, moderate-sized hiatal hernia, antal pyloric channel, and posterier bulbar erosions, otherwise unremarkable stomach, D1 and D2 . Inflammatory findings on the stomach and duodenum will likely be related to aspirin effect. We need to rule out Helicobacter pylor   ESOPHAGOGASTRODUODENOSCOPY N/A 03/10/2015   Dr. 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 Pathology with lymphoid population of stomach, slight atypia   ESOPHAGOGASTRODUODENOSCOPY N/A 02/03/2016   Dr. Gala Romney: Schatzki ring noted at GE junction, dilated with 22 and then 44 French Maloney dilator. Large hiatal hernia. 6 x 7 cm nodular geographically ulcerated mucosa, biopsy c/w MALToma   ESOPHAGOGASTRODUODENOSCOPY N/A 04/08/2016   Dr. Gala Romney: moderate Schatzki's ring/web s/p dilation, large hiatal hernia, localized area of gastric lymphoma visualized and appeared to be much improved. normal second portion of the duodenum. No specimens collected. Esophageal lumen notably tighted up significantly since her dilation in April of this year.    ESOPHAGOGASTRODUODENOSCOPY N/A 08/04/2016   Procedure: ESOPHAGOGASTRODUODENOSCOPY (EGD);  Surgeon: Daneil Dolin, MD;  [facility];  Service: Endoscopy;  Laterality: N/A;  7:30 am   ESOPHAGOGASTRODUODENOSCOPY N/A 09/01/2016   Procedure: ESOPHAGOGASTRODUODENOSCOPY (EGD);  Surgeon: Daneil Dolin, MD;  [facility];  Service: Endoscopy;  Laterality: N/A;  830    ESOPHAGOGASTRODUODENOSCOPY N/A 05/10/2017   Dr. Gala Romney: Moderate Schatzki ring at the GE junction, status post dilation with 43 [place].  Medium sized hiatal hernia.  Few localized erosions in the gastric antrum.  Stomach biopsy showed chronic gastritis, no H. pylori.  No atypical lymphoid infiltrates or other features of lymphoproliferative process   ESOPHAGOGASTRODUODENOSCOPY N/A 06/13/2018   Dr. Gala Romney: Schatzki ring status post dilation.  Large hiatal hernia.  Focal  area 4 x 4 cm along the greater curvature, somewhat erythematous and thickened mucosa, biopsy benign.  Next EGD in August 2020.   ESOPHAGOGASTRODUODENOSCOPY N/A 01/14/2021   Procedure: ESOPHAGOGASTRODUODENOSCOPY (EGD);  Surgeon: Daneil Dolin, MD;  [facility];  Service: Endoscopy;  Laterality: N/A;  AM   EYE SURGERY     INTERCOSTAL NERVE BLOCK Right 09/10/2019   Procedure: Intercostal Nerve Block;  Surgeon: [name], MD;  Location: [place];  Service: Thoracic;  Laterality: Right;   LOBECTOMY Left 05/19/2018   Procedure: LEFT LOWER LOBECTOMY;  Surgeon: [name], MD;  Location: [place];  Service: Thoracic;  Laterality: Left;   LYMPH NODE DISSECTION Right 09/10/2019   Procedure: Lymph Node Dissection;  Surgeon: [name], MD;  Location: [place];  Service: Thoracic;  Laterality: Right;   MALONEY DILATION N/A 03/10/2015   Procedure: Venia Minks DILATION;  Surgeon: Daneil Dolin, MD;  [facility];  Service: Endoscopy;  Laterality: N/A;   MALONEY DILATION N/A 02/03/2016   Procedure: Venia Minks DILATION;  Surgeon: Daneil Dolin, MD;  [facility];  Service: Endoscopy;  Laterality: N/A;   MALONEY DILATION N/A 04/08/2016   Procedure: Venia Minks DILATION;  Surgeon: Daneil Dolin, MD;  [facility];  Service: Endoscopy;  Laterality: N/A;   MALONEY DILATION N/A 05/10/2017   Procedure: Venia Minks DILATION;  Surgeon: Daneil Dolin, MD;  [facility];  Service: Endoscopy;  Laterality: N/A;   MALONEY DILATION N/A 06/13/2018   Procedure: Venia Minks DILATION;  Surgeon: Daneil Dolin, MD;  [facility];  Service: Endoscopy;  Laterality: N/A;   MALONEY DILATION N/A 01/14/2021   Procedure: Venia Minks DILATION;  Surgeon: Daneil Dolin, MD;  [facility];  Service: Endoscopy;  Laterality: N/A;   POLYPECTOMY  11/29/2018   Procedure: POLYPECTOMY;  Surgeon: Daneil Dolin, MD;  [facility];  Service: Endoscopy;;    VIDEO ASSISTED THORACOSCOPY (VATS)/ LOBECTOMY Right 09/10/2019   Procedure: VIDEO ASSISTED THORACOSCOPY (VATS)/RIGHT LOWER LOBECTOMY;  Surgeon: [name], MD;  Location: [place];  Service: Thoracic;  Laterality: Right;   VIDEO ASSISTED THORACOSCOPY (VATS)/WEDGE RESECTION Left 05/19/2018   Procedure: VIDEO ASSISTED THORACOSCOPY (VATS)/WEDGE RESECTION;  Surgeon: [name], MD;  Location: [place];  Service: Thoracic;  Laterality: Left;     Allergies  Allergen Reactions   Elemental Sulfur Hives      Family History  Problem Relation Age of Onset   Heart attack Father    Dementia Father    Diabetes Sister    Diabetes Brother    Diabetes Sister    Colon cancer Neg Hx      Social History Ms. Furgerson reports that she has been smoking cigarettes. She has a 28.50 pack-year smoking history. She has never used smokeless tobacco. Ms. Pundt reports no history of alcohol use.   Review of Systems CONSTITUTIONAL: No weight loss, fever, chills, weakness or fatigue.  HEENT: Eyes: No visual loss, blurred vision, double vision or yellow sclerae.No hearing loss, sneezing, congestion, runny nose or sore throat.  SKIN: No rash or itching.  CARDIOVASCULAR:  RESPIRATORY: No shortness of breath, cough or sputum.  GASTROINTESTINAL: No anorexia, nausea, vomiting or diarrhea. No abdominal pain or blood.  GENITOURINARY: No burning on urination, no polyuria NEUROLOGICAL: No headache, dizziness, syncope, paralysis, ataxia, numbness or tingling in the extremities. No change in bowel or bladder control.  MUSCULOSKELETAL: No muscle, back pain, joint pain or stiffness.  LYMPHATICS: No enlarged nodes. No history of splenectomy.  PSYCHIATRIC: No history of depression or anxiety.  ENDOCRINOLOGIC: No reports of sweating, cold or heat intolerance. No polyuria or polydipsia.  Marland Kitchen   Physical Examination There were no vitals filed for this visit. There were no vitals filed for this visit.  Gen:  resting comfortably, no acute distress HEENT: no scleral icterus, pupils equal round and reactive, no palptable cervical adenopathy,  CV Resp: Clear to auscultation bilaterally GI: abdomen is soft, non-tender, non-distended, normal bowel sounds, no hepatosplenomegaly MSK: extremities are warm, no edema.  Skin: warm, no rash Neuro:  no focal deficits Psych: appropriate affect   Diagnostic Studies  ECHO 2019: - Left ventricle: The cavity size was normal. Wall thickness was   increased in a pattern of moderate  LVH. Systolic function was   vigorous. The estimated ejection fraction was in the range of 65%   to 70%. Wall motion was normal; there were no regional wall   motion abnormalities. Features are consistent with a pseudonormal   left ventricular filling pattern, with concomitant abnormal   relaxation and increased filling pressure (grade 2 diastolic   dysfunction). - Aortic valve: Mildly calcified annulus. Trileaflet. - Mitral valve: There was mild regurgitation. - Left atrium: The atrium was moderately dilated. - Right atrium: Central venous pressure (est): 3 mm Hg. - Atrial septum: No defect or patent foramen ovale was identified. - Tricuspid valve: There was trivial regurgitation. - Pulmonary arteries: Systolic pressure could not be accurately   estimated. - Pericardium, extracardiac: There was no pericardial effusion   08/2021 echo 1. Left ventricular ejection fraction, by estimation, is 70 to 75%. The  left ventricle has hyperdynamic function. The left ventricle has no  regional wall motion abnormalities. There is moderate concentric left  ventricular hypertrophy. Left ventricular  diastolic parameters are consistent with Grade II diastolic dysfunction  (pseudonormalization).   2. Right ventricular systolic function is normal. The right ventricular  size is normal. There is mildly elevated pulmonary artery systolic  pressure. The estimated right ventricular systolic  pressure is 00.8 mmHg.   3. Left atrial size was mildly dilated.   4. The mitral valve is abnormal. Mild mitral valve regurgitation.   5. The aortic valve is tricuspid. Aortic valve regurgitation is not  visualized. Mild aortic valve sclerosis is present, with no evidence of  aortic valve stenosis.   6. The inferior vena cava is normal in size with greater than 50%  respiratory variability, suggesting right atrial pressure of 3 mmHg.   Comparison(s): Prior images unable to be directly viewed.    Assessment and Plan   1. PAF/acquired thrombophilia - no symptoms, continue current meds including eliquis for stroke prevention    2. HTN -above goal, start norvasc 5mg  daily   3. Hyperlipidemia - continue statin, request labs from pcp   4. Pulmonary HTN - mild by echo with normal RV, some enlargement of PA on prior CT scans - likely secondary to COPD by PFTs, grade II diastolic dysfunction - no further workup indicated.  5. Coronary atherosclerosis - noted on CT scan - no significant symptoms. She is on statin, no ASA since on eliquis.  - in absence of symptoms with advanced age would just focuse on risk factor modification.    Arnoldo Lenis, M.D., F.A.C.C.

## 2022-07-27 DIAGNOSIS — E7849 Other hyperlipidemia: Secondary | ICD-10-CM | POA: Diagnosis not present

## 2022-07-27 DIAGNOSIS — Z23 Encounter for immunization: Secondary | ICD-10-CM | POA: Diagnosis not present

## 2022-07-27 DIAGNOSIS — C3490 Malignant neoplasm of unspecified part of unspecified bronchus or lung: Secondary | ICD-10-CM | POA: Diagnosis not present

## 2022-07-27 DIAGNOSIS — R233 Spontaneous ecchymoses: Secondary | ICD-10-CM | POA: Diagnosis not present

## 2022-07-27 DIAGNOSIS — I10 Essential (primary) hypertension: Secondary | ICD-10-CM | POA: Diagnosis not present

## 2022-07-27 DIAGNOSIS — E1165 Type 2 diabetes mellitus with hyperglycemia: Secondary | ICD-10-CM | POA: Diagnosis not present

## 2022-07-27 DIAGNOSIS — M791 Myalgia, unspecified site: Secondary | ICD-10-CM | POA: Diagnosis not present

## 2022-07-27 DIAGNOSIS — K449 Diaphragmatic hernia without obstruction or gangrene: Secondary | ICD-10-CM | POA: Diagnosis not present

## 2022-07-27 DIAGNOSIS — I700 Atherosclerosis of aorta: Secondary | ICD-10-CM | POA: Diagnosis not present

## 2022-07-27 DIAGNOSIS — C884 Extranodal marginal zone B-cell lymphoma of mucosa-associated lymphoid tissue [MALT-lymphoma]: Secondary | ICD-10-CM | POA: Diagnosis not present

## 2022-07-27 DIAGNOSIS — M25472 Effusion, left ankle: Secondary | ICD-10-CM | POA: Diagnosis not present

## 2022-09-10 ENCOUNTER — Other Ambulatory Visit: Payer: Self-pay | Admitting: Cardiology

## 2022-09-10 DIAGNOSIS — Z23 Encounter for immunization: Secondary | ICD-10-CM | POA: Diagnosis not present

## 2022-09-20 ENCOUNTER — Other Ambulatory Visit (HOSPITAL_COMMUNITY): Payer: Medicare Other

## 2022-09-20 ENCOUNTER — Other Ambulatory Visit: Payer: Medicare Other

## 2022-09-29 ENCOUNTER — Ambulatory Visit: Payer: Medicare Other | Admitting: Hematology

## 2022-11-08 ENCOUNTER — Encounter: Payer: Self-pay | Admitting: Cardiology

## 2022-11-08 ENCOUNTER — Encounter: Payer: Self-pay | Admitting: *Deleted

## 2022-11-08 ENCOUNTER — Ambulatory Visit: Payer: Medicare Other | Attending: Cardiology | Admitting: Cardiology

## 2022-11-08 VITALS — BP 124/64 | HR 70 | Ht 66.5 in | Wt 173.4 lb

## 2022-11-08 DIAGNOSIS — I480 Paroxysmal atrial fibrillation: Secondary | ICD-10-CM

## 2022-11-08 DIAGNOSIS — I10 Essential (primary) hypertension: Secondary | ICD-10-CM

## 2022-11-08 DIAGNOSIS — D6869 Other thrombophilia: Secondary | ICD-10-CM | POA: Diagnosis not present

## 2022-11-08 DIAGNOSIS — E782 Mixed hyperlipidemia: Secondary | ICD-10-CM

## 2022-11-08 MED ORDER — METOPROLOL TARTRATE 25 MG PO TABS: 25.0000 mg | ORAL_TABLET | Freq: Two times a day (BID) | ORAL | 3 refills | Status: DC

## 2022-11-08 MED ORDER — METOPROLOL TARTRATE 25 MG PO TABS: 25.0000 mg | ORAL_TABLET | Freq: Two times a day (BID) | ORAL | 6 refills | Status: DC

## 2022-11-08 NOTE — Patient Instructions (Signed)
Medication Instructions:  Decrease Lopressor to 25mg  twice a day   Continue all other medications.     Labwork: none  Testing/Procedures: none  Follow-Up: 6 months   Any Other Special Instructions Will Be Listed Below (If Applicable).   If you need a refill on your cardiac medications before your next appointment, please call your pharmacy.

## 2022-11-08 NOTE — Progress Notes (Signed)
Clinical Summary Monica [name] is a 82 y.o.female seen today for follow up of the following medical problems.    1. PAF - has not tolerated amio in the past     - no recent palpitations - compliant with meds - occasional dizziness often with standing. Some generalized fatigue, leg weakiness with walking.      2. History of lung cancer - s/p resection - followed by oncology   3. Hyperlipidemia - labs followed by pcp - she is on pravastatin  03/2022 LDL 67   4. HTN - often clinic bp's higher than home bp's - compliant with meds   5. Lung cancer - followed by oncology, listed as stage I squamous cell - lobectomy in 2019 left side and right side 2020   6. Enlarged pulmonary artery/pulmonic trunk - noted on 06/2021 CT scan, also with signs of emphysema - 2019 echo could not estiamted PASP, normal RV. She did have grade II dd, LVEF 65-70%   08/2019 PFTs: moderate obstruction, moderate diffusion defect 08/2021 echo LVEF 70-75%, grade II dd, normal RV, PASP 38, mild MR  - no recent edema. SOB with activities which is chronic ever since last lung surgery.    7. 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     amLODipine (NORVASC) 5 MG tablet TAKE 1 TABLET BY MOUTH DAILY 100 tablet 2   apixaban (ELIQUIS) 5 MG TABS tablet TAKE 1 TABLET BY MOUTH TWICE  DAILY 200 tablet 1   citalopram (CELEXA) 20 MG tablet Take 20 mg by mouth daily.      losartan (COZAAR) 100 MG tablet Take 1 tablet (100 mg total) by mouth daily. 90 tablet 3   metFORMIN (GLUCOPHAGE-XR) 500 MG 24 hr tablet Take 500 mg by mouth daily with breakfast.     metoprolol tartrate (LOPRESSOR) 25 MG tablet TAKE 1 TABLET BY MOUTH TWICE A DAY (Patient taking differently: Take 25 mg by mouth 2 (two) times daily.) 180 tablet 0   Multiple Vitamin (MULTIVITAMIN WITH MINERALS) TABS tablet Take 1 tablet by mouth daily.     OZEMPIC, 0.25 OR 0.5 MG/DOSE, 2 MG/3ML SOPN Inject 2 mg into the skin once a week.     pantoprazole (PROTONIX) 40 MG tablet Take 1 tablet (40 mg total) by mouth daily. 90 tablet 3   pioglitazone (ACTOS) 15 MG tablet Take 15 mg by mouth daily.     Polyvinyl Alcohol-Povidone PF 1.4-0.6 % SOLN Place 1 drop  into both eyes as needed (Dry eyes).      pravastatin (PRAVACHOL) 40 MG tablet Take 40 mg by mouth at bedtime.      No current facility-administered medications for this visit.     Past Surgical History:  Procedure Laterality Date   ABDOMINAL HYSTERECTOMY     BIOPSY  09/01/2016   Procedure: BIOPSY;  Surgeon: Daneil Dolin, MD;  [facility];  Service: Endoscopy;;  gastric   BIOPSY  11/29/2018   Procedure: BIOPSY;  Surgeon: Daneil Dolin, MD;  [facility];  Service: Endoscopy;;  right colon   BLADDER SURGERY     CATARACT EXTRACTION W/ INTRAOCULAR LENS  IMPLANT, BILATERAL  2015   CATARACT EXTRACTION, BILATERAL     COLONOSCOPY     Dr. Lindalou Hose 2009: Normal per PCP notes   COLONOSCOPY N/A 11/29/2018   [place]: Diverticulosis, 3 polyps removed.  No evidence of colitis.  Tubular adenomas.  No future surveillance colonoscopies  recommended due to age.   ESOPHAGOGASTRODUODENOSCOPY     RMR: Prominant Schzgzki ring/component of peptic stricture status post dilation and disruption as described above, otherwise norma esophagus, moderate-sized hiatal hernia, antal pyloric channel, and posterier bulbar erosions, otherwise unremarkable stomach, D1 and D2 . Inflammatory findings on the stomach and duodenum will likely be related to aspirin effect. We need to rule out Helicobacter pylor   ESOPHAGOGASTRODUODENOSCOPY N/A 03/10/2015   Dr. 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 Pathology with lymphoid population of stomach, slight atypia   ESOPHAGOGASTRODUODENOSCOPY N/A 02/03/2016   Dr. Gala Romney: Schatzki ring noted at GE junction, dilated with 48 and then 52 French Maloney dilator. Large hiatal hernia. 6 x 7 cm nodular geographically ulcerated mucosa, biopsy c/w MALToma   ESOPHAGOGASTRODUODENOSCOPY N/A 04/08/2016   Dr. Gala Romney: moderate Schatzki's ring/web s/p dilation, large hiatal hernia, localized area of gastric lymphoma visualized and appeared to be much improved. normal second portion of the duodenum. No specimens collected. Esophageal lumen notably tighted up significantly since her dilation in April of this year.    ESOPHAGOGASTRODUODENOSCOPY N/A 08/04/2016   Procedure: ESOPHAGOGASTRODUODENOSCOPY (EGD);  Surgeon: Daneil Dolin, MD;  [facility];  Service: Endoscopy;  Laterality: N/A;  7:30 am   ESOPHAGOGASTRODUODENOSCOPY N/A 09/01/2016   Procedure: ESOPHAGOGASTRODUODENOSCOPY (EGD);  Surgeon: Daneil Dolin, MD;  [facility];  Service: Endoscopy;  Laterality: N/A;  830    ESOPHAGOGASTRODUODENOSCOPY N/A 05/10/2017   Dr. Gala Romney: Moderate Schatzki ring at the GE junction, status post dilation with 39 [place].  Medium sized hiatal hernia.  Few localized erosions in the gastric antrum.  Stomach biopsy showed chronic gastritis, no H. pylori.  No atypical  lymphoid infiltrates or other features of lymphoproliferative process   ESOPHAGOGASTRODUODENOSCOPY N/A 06/13/2018   Dr. Gala Romney: Schatzki ring status post dilation.  Large hiatal hernia.  Focal area 4 x 4 cm along the greater curvature, somewhat erythematous and thickened mucosa, biopsy benign.  Next EGD in August 2020.   ESOPHAGOGASTRODUODENOSCOPY N/A 01/14/2021   Procedure: ESOPHAGOGASTRODUODENOSCOPY (EGD);  Surgeon: Daneil Dolin, MD;  [facility];  Service: Endoscopy;  Laterality: N/A;  AM   EYE SURGERY     INTERCOSTAL NERVE BLOCK Right 09/10/2019   Procedure: Intercostal Nerve Block;  Surgeon: [name], MD;  Location: [place];  Service: Thoracic;  Laterality: Right;   LOBECTOMY Left 05/19/2018   Procedure: LEFT LOWER LOBECTOMY;  Surgeon: [name], MD;  Location: [place];  Service: Thoracic;  Laterality: Left;   LYMPH NODE DISSECTION Right 09/10/2019   Procedure: Lymph Node Dissection;  Surgeon: [name], MD;  Location: [place];  Service: Thoracic;  Laterality: Right;   MALONEY DILATION N/A 03/10/2015   Procedure: Keturah Shavers;  Surgeon: Daneil Dolin, MD;  [facility];  Service: Endoscopy;  Laterality: N/A;   MALONEY DILATION N/A 02/03/2016   Procedure: Venia Minks DILATION;  Surgeon: Daneil Dolin, MD;  [facility];  Service: Endoscopy;  Laterality: N/A;   MALONEY DILATION N/A 04/08/2016   Procedure: Venia Minks DILATION;  Surgeon: Daneil Dolin, MD;  [facility];  Service: Endoscopy;  Laterality: N/A;   MALONEY DILATION N/A 05/10/2017   Procedure: Venia Minks DILATION;  Surgeon: Daneil Dolin, MD;  [facility];  Service: Endoscopy;  Laterality: N/A;   MALONEY DILATION N/A 06/13/2018   Procedure: Venia Minks DILATION;  Surgeon: Daneil Dolin, MD;  [facility];  Service: Endoscopy;  Laterality: N/A;   MALONEY DILATION N/A 01/14/2021   Procedure: Venia Minks DILATION;  Surgeon: Daneil Dolin, MD;  [facility];  Service: Endoscopy;  Laterality: N/A;   POLYPECTOMY  11/29/2018   Procedure: POLYPECTOMY;  Surgeon: Daneil Dolin, MD;  [facility];  Service: Endoscopy;;   VIDEO ASSISTED THORACOSCOPY (VATS)/ LOBECTOMY Right 09/10/2019   Procedure: VIDEO ASSISTED THORACOSCOPY (VATS)/RIGHT LOWER LOBECTOMY;  Surgeon: [name], MD;  Location: [place];  Service: Thoracic;  Laterality: Right;   VIDEO ASSISTED THORACOSCOPY (VATS)/WEDGE RESECTION Left 05/19/2018   Procedure: VIDEO ASSISTED THORACOSCOPY (VATS)/WEDGE RESECTION;  Surgeon: [name], MD;  Location: [place];  Service: Thoracic;  Laterality: Left;     Allergies  Allergen Reactions   Elemental Sulfur Hives      Family History  Problem Relation Age of Onset   Heart attack Father    Dementia Father    Diabetes Sister    Diabetes Brother    Diabetes Sister    Colon cancer Neg Hx      Social History Ms. Viti reports that she has been smoking cigarettes. She has a 28.50 pack-year smoking history. She has never used smokeless tobacco. Ms. Sobh reports no history of alcohol use.   Review of Systems CONSTITUTIONAL: No weight loss, fever, chills, weakness or fatigue.  HEENT: Eyes: No visual loss, blurred vision, double vision or yellow sclerae.No hearing loss, sneezing, congestion, runny nose or sore throat.  SKIN: No rash or itching.  CARDIOVASCULAR: per hpi RESPIRATORY: No shortness of breath, cough or sputum.  GASTROINTESTINAL: No anorexia, nausea, vomiting or diarrhea. No abdominal pain or blood.  GENITOURINARY: No burning on urination, no polyuria NEUROLOGICAL: No headache, dizziness, syncope, paralysis, ataxia, numbness or tingling in the extremities. No change in bowel or bladder control.  MUSCULOSKELETAL: No muscle, back pain, joint pain or stiffness.  LYMPHATICS: No enlarged nodes. No history of splenectomy.  PSYCHIATRIC: No history of depression or anxiety.  ENDOCRINOLOGIC: No reports  of sweating, cold or heat intolerance. No polyuria or polydipsia.  Marland Kitchen   Physical Examination Today's Vitals   11/08/22 1057  BP: 124/64  Pulse: 70  SpO2: 95%  Weight: 173 lb 6.4 oz (78.7 kg)  Height: 5' 6.5" (1.689 m)   Body mass index is 27.57 kg/m.  Gen: resting comfortably, no acute distress HEENT: no scleral icterus, pupils equal round and reactive, no palptable cervical adenopathy,  CV: RRR, no m/r/g, no jvd Resp: Clear to auscultation bilaterally  GI: abdomen is soft, non-tender, non-distended, normal bowel sounds, no hepatosplenomegaly MSK: extremities are warm, no edema.  Skin: warm, no rash Neuro:  no focal deficits Psych: appropriate affect   Diagnostic Studies   ECHO 2019: - Left ventricle: The cavity size was normal. Wall thickness was   increased in a pattern of moderate LVH. Systolic function was   vigorous. The estimated ejection fraction was in the range of 65%   to 70%. Wall motion was normal; there were no regional wall   motion abnormalities. Features are consistent with a pseudonormal   left ventricular filling pattern, with concomitant abnormal   relaxation and increased filling pressure (grade 2 diastolic   dysfunction). - Aortic valve: Mildly calcified annulus. Trileaflet. - Mitral valve: There was mild regurgitation. - Left atrium: The atrium was moderately dilated. - Right atrium: Central venous pressure (est): 3 mm Hg. - Atrial septum: No defect or patent foramen ovale was identified. - Tricuspid valve: There was trivial regurgitation. - Pulmonary arteries: Systolic pressure could not be accurately   estimated. - Pericardium, extracardiac: There was no pericardial effusion   08/2021 echo 1. Left ventricular ejection fraction, by estimation, is 70 to 75%. The  left ventricle has hyperdynamic function. The left ventricle has no  regional wall motion abnormalities. There is moderate concentric left  ventricular hypertrophy. Left ventricular   diastolic parameters are consistent with Grade II diastolic dysfunction  (pseudonormalization).   2. Right ventricular systolic function is normal. The right ventricular  size is normal. There is mildly elevated pulmonary artery systolic  pressure. The estimated right ventricular systolic pressure is 33.8 mmHg.   3. Left atrial size was mildly dilated.   4. The mitral valve is abnormal. Mild mitral valve regurgitation.   5. The aortic valve is tricuspid. Aortic valve regurgitation is not  visualized. Mild aortic valve sclerosis is present, with no evidence of  aortic valve stenosis.   6. The inferior vena cava is normal in size with greater than 50%  respiratory variability, suggesting right atrial pressure of 3 mmHg.   Comparison(s): Prior images unable to be directly viewed.   Assessment and Plan   1. PAF/acquired thrombophilia - no symptoms - some dizziness at times, nonspecific fatigue. Try lowering lopressor to 25mg  bid.    2. HTN -at goal, continue current meds   3. Hyperlipidemia -at goal, continue current meds   4. Pulmonary HTN - mild by echo with normal RV, some enlargement of PA on prior CT scans - likely secondary to COPD by PFTs, grade II diastolic dysfunction - no further workup indicated.   5. Coronary atherosclerosis - noted on CT scan - no symptoms, continue risk factor modification.      Arnoldo Lenis, M.D.

## 2022-11-08 NOTE — Addendum Note (Signed)
Addended by: Laurine Blazer on: 11/08/2022 11:23 AM   Modules accepted: Orders

## 2022-11-15 DIAGNOSIS — I10 Essential (primary) hypertension: Secondary | ICD-10-CM | POA: Diagnosis not present

## 2022-11-15 DIAGNOSIS — N183 Chronic kidney disease, stage 3 unspecified: Secondary | ICD-10-CM | POA: Diagnosis not present

## 2022-11-15 DIAGNOSIS — E7849 Other hyperlipidemia: Secondary | ICD-10-CM | POA: Diagnosis not present

## 2022-11-15 DIAGNOSIS — E1165 Type 2 diabetes mellitus with hyperglycemia: Secondary | ICD-10-CM | POA: Diagnosis not present

## 2022-11-16 ENCOUNTER — Telehealth: Payer: Self-pay | Admitting: Cardiology

## 2022-11-16 NOTE — Telephone Encounter (Signed)
*  STAT* If patient is at the pharmacy, call can be transferred to refill team.   1. Which medications need to be refilled? (please list name of each medication and dose if known)  Metoprolol   2. Which pharmacy/location (including street and city if local pharmacy) is medication to be sent to?[facility]  3. Do they need a 30 day or 90 day supply? 90 days and refills

## 2022-11-16 NOTE — Telephone Encounter (Signed)
Called the mail order pharmacy [facility] and was informed that the Rx was to soon to refill due to a Rx was also sent to patient's local pharmacy same day for 90 day supply and due to insurance that refilling now through [facility] would be to soon and can be refilled on 01/10/23. Rx can be refilled if the local Rx is canceled and then sent back to [facility]. Called local pharmacy [facility] and spoke with pharmacist and informed her that I was calling to cancel the Metoprolol Tartrate 25 mg BID canceled. Rx was canceled and called back to [facility] to send refill.

## 2022-11-17 ENCOUNTER — Other Ambulatory Visit: Payer: Self-pay | Admitting: *Deleted

## 2022-11-17 DIAGNOSIS — C3431 Malignant neoplasm of lower lobe, right bronchus or lung: Secondary | ICD-10-CM

## 2022-11-17 DIAGNOSIS — E611 Iron deficiency: Secondary | ICD-10-CM

## 2022-11-18 ENCOUNTER — Inpatient Hospital Stay: Payer: Medicare Other | Attending: Hematology

## 2022-11-18 ENCOUNTER — Ambulatory Visit (HOSPITAL_COMMUNITY)
Admission: RE | Admit: 2022-11-18 | Discharge: 2022-11-18 | Disposition: A | Discharge status: Home / Self Care | Payer: Medicare Other | Source: Ambulatory Visit | Attending: Hematology | Admitting: Hematology

## 2022-11-18 DIAGNOSIS — R918 Other nonspecific abnormal finding of lung field: Secondary | ICD-10-CM | POA: Insufficient documentation

## 2022-11-18 DIAGNOSIS — Z85118 Personal history of other malignant neoplasm of bronchus and lung: Secondary | ICD-10-CM | POA: Insufficient documentation

## 2022-11-18 DIAGNOSIS — C3431 Malignant neoplasm of lower lobe, right bronchus or lung: Secondary | ICD-10-CM

## 2022-11-18 DIAGNOSIS — I700 Atherosclerosis of aorta: Secondary | ICD-10-CM | POA: Insufficient documentation

## 2022-11-18 DIAGNOSIS — Z902 Acquired absence of lung [part of]: Secondary | ICD-10-CM | POA: Diagnosis not present

## 2022-11-18 DIAGNOSIS — E611 Iron deficiency: Secondary | ICD-10-CM

## 2022-11-18 DIAGNOSIS — J439 Emphysema, unspecified: Secondary | ICD-10-CM | POA: Diagnosis not present

## 2022-11-18 DIAGNOSIS — K861 Other chronic pancreatitis: Secondary | ICD-10-CM | POA: Diagnosis not present

## 2022-11-18 DIAGNOSIS — E559 Vitamin D deficiency, unspecified: Secondary | ICD-10-CM | POA: Insufficient documentation

## 2022-11-18 DIAGNOSIS — K449 Diaphragmatic hernia without obstruction or gangrene: Secondary | ICD-10-CM | POA: Insufficient documentation

## 2022-11-18 DIAGNOSIS — J432 Centrilobular emphysema: Secondary | ICD-10-CM | POA: Insufficient documentation

## 2022-11-18 DIAGNOSIS — C884 Extranodal marginal zone B-cell lymphoma of mucosa-associated lymphoid tissue [MALT-lymphoma]: Secondary | ICD-10-CM | POA: Insufficient documentation

## 2022-11-18 DIAGNOSIS — C3490 Malignant neoplasm of unspecified part of unspecified bronchus or lung: Secondary | ICD-10-CM | POA: Diagnosis not present

## 2022-11-18 LAB — CBC WITH DIFFERENTIAL/PLATELET
Abs Immature Granulocytes: 0.01 10*3/uL (ref 0.00–0.07)
Basophils Absolute: 0.1 10*3/uL (ref 0.0–0.1)
Basophils Relative: 1 %
Eosinophils Absolute: 0.1 10*3/uL (ref 0.0–0.5)
Eosinophils Relative: 1 %
HCT: 36.9 % (ref 36.0–46.0)
Hemoglobin: 12 g/dL (ref 12.0–15.0)
Immature Granulocytes: 0 %
Lymphocytes Relative: 35 %
Lymphs Abs: 2.3 10*3/uL (ref 0.7–4.0)
MCH: 29.9 pg (ref 26.0–34.0)
MCHC: 32.5 g/dL (ref 30.0–36.0)
MCV: 92 fL (ref 80.0–100.0)
Monocytes Absolute: 0.7 10*3/uL (ref 0.1–1.0)
Monocytes Relative: 10 %
Neutro Abs: 3.6 10*3/uL (ref 1.7–7.7)
Neutrophils Relative %: 53 %
Platelets: 243 10*3/uL (ref 150–400)
RBC: 4.01 MIL/uL (ref 3.87–5.11)
RDW: 14.5 % (ref 11.5–15.5)
WBC: 6.7 10*3/uL (ref 4.0–10.5)
nRBC: 0 % (ref 0.0–0.2)

## 2022-11-18 LAB — COMPREHENSIVE METABOLIC PANEL
ALT: 18 U/L (ref 0–44)
AST: 25 U/L (ref 15–41)
Albumin: 3.7 g/dL (ref 3.5–5.0)
Alkaline Phosphatase: 58 U/L (ref 38–126)
Anion gap: 10 (ref 5–15)
BUN: 15 mg/dL (ref 8–23)
CO2: 24 mmol/L (ref 22–32)
Calcium: 8.6 mg/dL — ABNORMAL LOW (ref 8.9–10.3)
Chloride: 100 mmol/L (ref 98–111)
Creatinine, Ser: 0.87 mg/dL (ref 0.44–1.00)
GFR, Estimated: >60 mL/min (ref >60)
Glucose, Bld: 142 mg/dL — ABNORMAL HIGH (ref 70–99)
Potassium: 3.8 mmol/L (ref 3.5–5.1)
Sodium: 134 mmol/L — ABNORMAL LOW (ref 135–145)
Total Bilirubin: 0.3 mg/dL (ref 0.3–1.2)
Total Protein: 7.2 g/dL (ref 6.5–8.1)

## 2022-11-18 LAB — IRON AND TIBC
Iron: 66 ug/dL (ref 28–170)
Saturation Ratios: 19 % (ref 10.4–31.8)
TIBC: 341 ug/dL (ref 250–450)
UIBC: 275 ug/dL

## 2022-11-18 LAB — LACTATE DEHYDROGENASE: LDH: 111 U/L (ref 98–192)

## 2022-11-18 LAB — FERRITIN: Ferritin: 27 ng/mL (ref 11–307)

## 2022-11-18 LAB — VITAMIN D 25 HYDROXY (VIT D DEFICIENCY, FRACTURES): Vit D, 25-Hydroxy: 27.33 ng/mL — ABNORMAL LOW (ref 30–100)

## 2022-11-18 MED ORDER — IOHEXOL 300 MG/ML  SOLN
75.0000 mL | Freq: Once | INTRAMUSCULAR | Status: AC | PRN
  Administered 2022-11-18: 75 mL via INTRAVENOUS

## 2022-11-22 ENCOUNTER — Encounter: Payer: Self-pay | Admitting: Hematology

## 2022-11-22 ENCOUNTER — Inpatient Hospital Stay: Payer: Medicare Other | Admitting: Hematology

## 2022-11-22 VITALS — BP 108/56 | HR 83 | Temp 97.5°F | Resp 18 | Wt 177.0 lb

## 2022-11-22 DIAGNOSIS — I700 Atherosclerosis of aorta: Secondary | ICD-10-CM | POA: Diagnosis not present

## 2022-11-22 DIAGNOSIS — L570 Actinic keratosis: Secondary | ICD-10-CM | POA: Diagnosis not present

## 2022-11-22 DIAGNOSIS — C3431 Malignant neoplasm of lower lobe, right bronchus or lung: Secondary | ICD-10-CM

## 2022-11-22 DIAGNOSIS — C884 Extranodal marginal zone B-cell lymphoma of mucosa-associated lymphoid tissue [MALT-lymphoma]: Secondary | ICD-10-CM | POA: Diagnosis not present

## 2022-11-22 DIAGNOSIS — E611 Iron deficiency: Secondary | ICD-10-CM | POA: Diagnosis not present

## 2022-11-22 DIAGNOSIS — C3490 Malignant neoplasm of unspecified part of unspecified bronchus or lung: Secondary | ICD-10-CM | POA: Diagnosis not present

## 2022-11-22 DIAGNOSIS — M25472 Effusion, left ankle: Secondary | ICD-10-CM | POA: Diagnosis not present

## 2022-11-22 DIAGNOSIS — E1165 Type 2 diabetes mellitus with hyperglycemia: Secondary | ICD-10-CM | POA: Diagnosis not present

## 2022-11-22 DIAGNOSIS — J069 Acute upper respiratory infection, unspecified: Secondary | ICD-10-CM | POA: Diagnosis not present

## 2022-11-22 DIAGNOSIS — K449 Diaphragmatic hernia without obstruction or gangrene: Secondary | ICD-10-CM | POA: Diagnosis not present

## 2022-11-22 DIAGNOSIS — M791 Myalgia, unspecified site: Secondary | ICD-10-CM | POA: Diagnosis not present

## 2022-11-22 DIAGNOSIS — I10 Essential (primary) hypertension: Secondary | ICD-10-CM | POA: Diagnosis not present

## 2022-11-22 DIAGNOSIS — E559 Vitamin D deficiency, unspecified: Secondary | ICD-10-CM | POA: Diagnosis not present

## 2022-11-22 DIAGNOSIS — Z85118 Personal history of other malignant neoplasm of bronchus and lung: Secondary | ICD-10-CM | POA: Diagnosis not present

## 2022-11-22 DIAGNOSIS — E7849 Other hyperlipidemia: Secondary | ICD-10-CM | POA: Diagnosis not present

## 2022-11-22 NOTE — Progress Notes (Signed)
[facility] [address], [place] [address]   CLINIC:  Medical Oncology/Hematology  PCP:  Estanislado Pandy, MD [address] [place] [address] [phone]   REASON FOR VISIT:  Follow-up for left and right lung cancer  PRIOR THERAPY:  1. Left lower lobectomy on 05/19/2018. 2. VATS with right lower lobectomy on 09/10/2019.  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 Jena Gauss, abnormal gastric mucosa. Pathology with EXTRANODAL Marginal zone lymphoma. NEGATIVE for H. Pylori   03/03/2016 Miscellaneous   H pylori stool antigen NEGATIVE   03/03/2016 PET scan   Focal area of hypermetabolism and wall thickening involving the body antral junction region of the stomach c/w history of lymphoma. 5.5 mm LLL pulm nodule not hypermetabolic. Non contrast chest CT in 6 month   03/18/2016 - 04/08/2016 Radiation Therapy   The gastric tumor received 30 Gy in 15 fractions of 2 Gy   Lung cancer (HCC)  05/19/2018 Initial Diagnosis   Lung cancer (HCC)   06/06/2018 Cancer Staging   Staging form: Lung, AJCC 8th Edition - Clinical: cT1b, cN0 - Signed by Ahmed Prima, MD on 06/06/2018     CANCER STAGING:  Cancer Staging  Lung cancer [facility]) Staging form: Lung, AJCC 8th Edition - Clinical: cT1b, cN0 - Signed by Ahmed Prima, MD on 06/06/2018  MALToma [facility]) Staging form: Lymphoid Neoplasms, AJCC 6th Edition - Clinical stage from 03/23/2016: Stage I - Signed by Ellouise Newer, PA-C on 03/23/2016   INTERVAL HISTORY:  Monica [name], a 82 y.o. female, seen for follow-up of lung cancer.  Energy levels are low.  Denies any chest pains, cough or shortness of breath.   REVIEW OF SYSTEMS:  Review of Systems  Constitutional:  Positive for fatigue. Negative for appetite change, fever and unexpected weight change.  HENT:   Negative  for trouble swallowing.   Respiratory:  Positive for cough and shortness of breath. Negative for hemoptysis.   Cardiovascular:  Negative for chest pain and palpitations.  Gastrointestinal:  Positive for diarrhea.  Endocrine: Negative for hot flashes.  Neurological:  Positive for dizziness.  All other systems reviewed and are negative.   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 Cleotis Nipper 2009: Normal per PCP notes   COLONOSCOPY N/A 11/29/2018   Rourk: Diverticulosis, 3 polyps removed.  No evidence of colitis.  Tubular adenomas.  No future surveillance colonoscopies recommended due to age.   ESOPHAGOGASTRODUODENOSCOPY     RMR: Prominant Schzgzki ring/component of peptic stricture status post dilation and disruption as described above, otherwise norma esophagus, moderate-sized hiatal hernia, antal pyloric channel, and posterier bulbar erosions, otherwise unremarkable  stomach, D1 and D2 . Inflammatory findings on the stomach and duodenum will likely be related to aspirin effect. We need to rule out Helicobacter pylor   ESOPHAGOGASTRODUODENOSCOPY N/A 03/10/2015   Dr. 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 Pathology with lymphoid population of stomach, slight atypia   ESOPHAGOGASTRODUODENOSCOPY N/A 02/03/2016   Dr. Jena Gauss: Schatzki ring noted at GE junction, dilated with 56 and then 58 French Maloney dilator. Large hiatal hernia. 6 x 7 cm nodular geographically ulcerated mucosa, biopsy c/w MALToma   ESOPHAGOGASTRODUODENOSCOPY N/A 04/08/2016   Dr. Jena Gauss: moderate Schatzki's ring/web s/p dilation, large hiatal hernia, localized area of gastric lymphoma visualized and appeared to be much improved. normal second portion of the duodenum. No specimens collected. Esophageal lumen notably tighted up significantly since her dilation in April of this year.    ESOPHAGOGASTRODUODENOSCOPY N/A 08/04/2016   Procedure: ESOPHAGOGASTRODUODENOSCOPY (EGD);  Surgeon: Corbin Ade, MD;  [facility];  Service: Endoscopy;  Laterality: N/A;  7:30 am   ESOPHAGOGASTRODUODENOSCOPY N/A 09/01/2016   Procedure: ESOPHAGOGASTRODUODENOSCOPY (EGD);  Surgeon: Corbin Ade, MD;  [facility];  Service: Endoscopy;  Laterality: N/A;  830    ESOPHAGOGASTRODUODENOSCOPY N/A 05/10/2017   Dr. Jena Gauss: Moderate Schatzki ring at the GE junction, status post dilation with 54 [place].  Medium sized hiatal hernia.  Few localized erosions in the gastric antrum.  Stomach biopsy showed chronic gastritis, no H. pylori.  No atypical lymphoid infiltrates or other features of lymphoproliferative process   ESOPHAGOGASTRODUODENOSCOPY N/A 06/13/2018   Dr. Jena Gauss: Schatzki ring status post dilation.  Large hiatal hernia.  Focal area 4 x 4 cm along the greater curvature, somewhat erythematous and thickened mucosa, biopsy benign.  Next EGD in  August 2020.   ESOPHAGOGASTRODUODENOSCOPY N/A 01/14/2021   Procedure: ESOPHAGOGASTRODUODENOSCOPY (EGD);  Surgeon: Corbin Ade, MD;  [facility];  Service: Endoscopy;  Laterality: N/A;  AM   EYE SURGERY     INTERCOSTAL NERVE BLOCK Right 09/10/2019   Procedure: Intercostal Nerve Block;  Surgeon: Loreli Slot, MD;  Location: [facility] OR;  Service: Thoracic;  Laterality: Right;   LOBECTOMY Left 05/19/2018   Procedure: LEFT LOWER LOBECTOMY;  Surgeon: Loreli Slot, MD;  Location: [facility] OR;  Service: Thoracic;  Laterality: Left;   LYMPH NODE DISSECTION Right 09/10/2019   Procedure: Lymph Node Dissection;  Surgeon: Loreli Slot, MD;  Location: [facility] OR;  Service: Thoracic;  Laterality: Right;   MALONEY DILATION N/A 03/10/2015   Procedure: Alvy Beal;  Surgeon: Corbin Ade, MD;  [facility];  Service: Endoscopy;  Laterality: N/A;   MALONEY DILATION N/A 02/03/2016   Procedure: [name] DILATION;  Surgeon: Corbin Ade, MD;  [facility];  Service: Endoscopy;  Laterality: N/A;   MALONEY DILATION N/A 04/08/2016   Procedure: [name] DILATION;  Surgeon: Corbin Ade, MD;  [facility];  Service: Endoscopy;  Laterality: N/A;   MALONEY DILATION N/A 05/10/2017   Procedure: [name] DILATION;  Surgeon: Corbin Ade, MD;  [facility];  Service: Endoscopy;  Laterality: N/A;   MALONEY DILATION N/A 06/13/2018   Procedure: [name] DILATION;  Surgeon: Corbin Ade, MD;  [facility];  Service: Endoscopy;  Laterality: N/A;   MALONEY DILATION N/A 01/14/2021   Procedure: [name] DILATION;  Surgeon: Corbin Ade, MD;  [facility];  Service: Endoscopy;  Laterality: N/A;   POLYPECTOMY  11/29/2018   Procedure: POLYPECTOMY;  Surgeon: Corbin Ade, MD;  [facility];  Service: Endoscopy;;   VIDEO ASSISTED THORACOSCOPY (VATS)/ LOBECTOMY Right 09/10/2019   Procedure: VIDEO ASSISTED THORACOSCOPY (VATS)/RIGHT  LOWER LOBECTOMY;  Surgeon: Loreli Slot, MD;  Location: [facility] OR;  Service: Thoracic;  Laterality: Right;   VIDEO ASSISTED THORACOSCOPY (VATS)/WEDGE RESECTION Left 05/19/2018   Procedure: VIDEO ASSISTED THORACOSCOPY (VATS)/WEDGE RESECTION;  Surgeon: Loreli Slot, MD;  Location: [facility] OR;  Service: Thoracic;  Laterality: Left;    SOCIAL HISTORY:  Social History   Socioeconomic History   Marital status: Married    Spouse name: Not on file   Number of children: Not on file   Years of education: Not on file   Highest education level: Not on file  Occupational History   Not on file  Tobacco Use   Smoking status: Former    Packs/day: 0.50    Years: 57.00    Total pack years: 28.50    Types: Cigarettes    Quit date: 08/01/2022    Years since quitting: 0.3   Smokeless tobacco: Never   Tobacco comments:    trying to quit, wearing patch  Vaping Use   Vaping Use: Never used  Substance and Sexual Activity   Alcohol use: No    Alcohol/week: 0.0 standard drinks of alcohol   Drug use: No   Sexual activity: Not Currently  Other Topics Concern   Not on file  Social History Narrative   Not on file   Social Determinants of Health   Financial Resource Strain: Not on file  Food Insecurity: Not on file  Transportation Needs: Not on file  Physical Activity: Not on file  Stress: Not on file  Social Connections: Not on file  Intimate Partner Violence: Not on file    FAMILY HISTORY:  Family History  Problem Relation Age of Onset   Heart attack Father    Dementia Father    Diabetes Sister    Diabetes Brother    Diabetes Sister    Colon cancer Neg Hx     CURRENT MEDICATIONS:  Current Outpatient Medications  Medication Sig Dispense Refill   acetaminophen (TYLENOL) 325 MG tablet Take 650 mg by mouth every 6 (six) hours as needed for moderate pain or headache.     amLODipine (NORVASC) 5 MG tablet TAKE 1 TABLET BY MOUTH DAILY 100 tablet 2   apixaban (ELIQUIS) 5 MG TABS  tablet TAKE 1 TABLET BY MOUTH TWICE  DAILY 200 tablet 1   citalopram (CELEXA) 20 MG tablet Take 20 mg by mouth daily.      losartan (COZAAR) 50 MG tablet Take 50 mg by mouth daily.     metFORMIN (GLUCOPHAGE-XR) 500 MG 24 hr tablet Take 500 mg by mouth daily with breakfast.     metoprolol tartrate (LOPRESSOR) 25 MG tablet Take 1 tablet (25 mg total) by mouth 2 (two) times daily. 180 tablet  3   Multiple Vitamin (MULTIVITAMIN WITH MINERALS) TABS tablet Take 1 tablet by mouth daily.     pantoprazole (PROTONIX) 40 MG tablet Take 1 tablet (40 mg total) by mouth daily. 90 tablet 3   pioglitazone (ACTOS) 15 MG tablet Take 15 mg by mouth daily.     Polyvinyl Alcohol-Povidone PF 1.4-0.6 % SOLN Place 1 drop into both eyes as needed (Dry eyes).      pravastatin (PRAVACHOL) 40 MG tablet Take 40 mg by mouth at bedtime.      No current facility-administered medications for this visit.    ALLERGIES:  Allergies  Allergen Reactions   Elemental Sulfur Hives    PHYSICAL EXAM:  Performance status (ECOG): 1 - Symptomatic but completely ambulatory  Vitals:   11/22/22 1107  BP: (!) 108/56  Pulse: 83  Resp: 18  Temp: (!) 97.5 F (36.4 C)  SpO2: 96%   Wt Readings from Last 3 Encounters:  11/22/22 177 lb (80.3 kg)  11/08/22 173 lb 6.4 oz (78.7 kg)  06/24/22 169 lb 9.6 oz (76.9 kg)   Physical Exam Vitals reviewed.  Constitutional:      Appearance: Normal appearance.  Cardiovascular:     Rate and Rhythm: Normal rate and regular rhythm.     Pulses: Normal pulses.     Heart sounds: Normal heart sounds.  Pulmonary:     Effort: Pulmonary effort is normal.     Breath sounds: Normal breath sounds.  Neurological:     General: No focal deficit present.     Mental Status: She is alert and oriented to person, place, and time.  Psychiatric:        Mood and Affect: Mood normal.        Behavior: Behavior normal.      LABORATORY DATA:  I have reviewed the labs as listed.     Latest Ref Rng & Units  11/18/2022   12:57 PM 06/17/2022    9:58 AM 12/17/2021    7:52 AM  CBC  WBC 4.0 - 10.5 K/uL 6.7  6.3  6.0   Hemoglobin 12.0 - 15.0 g/dL 12.0  12.7  12.7   Hematocrit 36.0 - 46.0 % 36.9  38.0  39.3   Platelets 150 - 400 K/uL 243  244  233       Latest Ref Rng & Units 11/18/2022   12:57 PM 06/17/2022    9:58 AM 12/17/2021    7:52 AM  CMP  Glucose 70 - 99 mg/dL 142  153  242   BUN 8 - 23 mg/dL 15  12  14    Creatinine 0.44 - 1.00 mg/dL 0.87  0.84  0.93   Sodium 135 - 145 mmol/L 134  138  133   Potassium 3.5 - 5.1 mmol/L 3.8  3.7  3.7   Chloride 98 - 111 mmol/L 100  102  100   CO2 22 - 32 mmol/L 24  27  19    Calcium 8.9 - 10.3 mg/dL 8.6  9.1  8.9   Total Protein 6.5 - 8.1 g/dL 7.2  7.3  7.1   Total Bilirubin 0.3 - 1.2 mg/dL 0.3  0.5  0.7   Alkaline Phos 38 - 126 U/L 58  45  63   AST 15 - 41 U/L 25  22  23    ALT 0 - 44 U/L 18  16  15      DIAGNOSTIC IMAGING:  I have independently reviewed the scans and discussed with the patient. CT Chest  W Contrast  Result Date: 11/19/2022 CLINICAL DATA:  Non-small cell lung cancer restaging * Tracking Code: BO * EXAM: CT CHEST WITH CONTRAST TECHNIQUE: Multidetector CT imaging of the chest was performed during intravenous contrast administration. RADIATION DOSE REDUCTION: This exam was performed according to the departmental dose-optimization program which includes automated exposure control, adjustment of the mA and/or kV according to patient size and/or use of iterative reconstruction technique. CONTRAST:  84mL OMNIPAQUE IOHEXOL 300 MG/ML  SOLN COMPARISON:  Multiple exams, including 06/17/2022 FINDINGS: Cardiovascular: Coronary, aortic arch, and branch vessel atherosclerotic vascular disease. Mild calcification of the mitral valve and aortic valve. Mediastinum/Nodes: Large hiatal hernia containing most of the stomach. No pathologic adenopathy observed. Lungs/Pleura: Centrilobular emphysema. Right lower lobectomy.  Left lower lobectomy. Mild scarring  peripherally at the left lung base. The 3 by 2 mm perifissural nodule between the right upper lobe and right middle lobe on image 86 of series 4 is unchanged Substantially reduced size of the linear in bandlike nodularity in the left lung adjacent to the hiatal hernia, currently measuring 4 by 2 mm on image 91 of series 4 (previously measured at 1.0 by 0.5 cm), likely inflammatory. No new or worrisome pulmonary nodule is identified. Upper Abdomen: Punctate calcifications in the pancreas compatible with chronic calcific pancreatitis. Musculoskeletal: Thoracic spondylosis and mild dextroconvex thoracic scoliosis. IMPRESSION: 1. No findings of active/recurrent malignancy. 2. Substantial reduction in size of the linear and bandlike nodularity in the left lung adjacent to the hiatal hernia, likely inflammatory. 3. Chronic calcific pancreatitis. 4. Large hiatal hernia containing most of the stomach. 5. Aortic atherosclerosis. 6. Emphysema. Aortic Atherosclerosis (ICD10-I70.0) and Emphysema (ICD10-J43.9). Electronically Signed   By: Gaylyn Rong M.D.   On: 11/19/2022 11:59     ASSESSMENT:  1.  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 -Adjuvant therapy was not recommended.   2.  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  3.  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  H. pylori negative. -XRT 30 Gray in 15 fractions from 03/18/2016 through 04/18/2016. -EGD biopsy on 06/13/2018 shows mild chronic gastritis and small lymphoid aggregates with no features of lymphoma. -PET scan on 08/13/2019 did  not show any abnormal uptake associated with abdominal organs.  No lymphadenopathy. -Colonoscopy on 11/29/2018 showed diverticulosis in the sigmoid colon with 2 subcentimeter polyps in sigmoid colon.  PLAN:  1.  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  No suspicious areas were seen.  Other benign findings discussed. - RTC 6 months with CT chest without contrast.   2.  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   3.  Gastric marginal zone lymphoma: - No B symptoms or palpable adenopathy.  LDH is normal.   4.  Low vitamin D levels: - She is taking multivitamin tablet daily.  Vitamin D is low at 27.  I have recommended her to take vitamin D 400 units daily.   Orders placed this encounter:  Orders Placed This Encounter  Procedures   CT Chest  Wo Contrast   CBC with Differential   Comprehensive metabolic panel   VITAMIN D 25 Hydroxy (Vit-D Deficiency, Fractures)   Iron and TIBC ([facility] ONLY)   Ferritin      Doreatha Massed, MD [facility] [phone]

## 2022-11-22 NOTE — Patient Instructions (Addendum)
[facility] Discharge Instructions   You were seen and examined today by Dr. Ellin Saba.  He reviewed the results of your CT scan which is normal/stable.   Your vitamin D is slightly low at 27. All other results from your lab work are normal/stable.   You should start taking Vitamin D (400 units) a day in addition to your multivitamin. You should also start taking an over the counter iron pill (325 mg) daily.   We will see you back in 6 months. We will repeat lab work and at CT scan prior to your next visit.    Thank you for choosing  Cancer Center at [facility] to provide your oncology and hematology care.  To afford each patient quality time with our provider, please arrive at least 15 minutes before your scheduled appointment time.   If you have a lab appointment with the Cancer Center please come in thru the Main Entrance and check in at the main information desk.  You need to re-schedule your appointment should you arrive 10 or more minutes late.  We strive to give you quality time with our providers, and arriving late affects you and other patients whose appointments are after yours.  Also, if you no show three or more times for appointments you may be dismissed from the clinic at the providers discretion.     Again, thank you for choosing [facility].  Our hope is that these requests will decrease the amount of time that you wait before being seen by our physicians.       _____________________________________________________________  Should you have questions after your visit to [facility], please contact our office at [phone] and follow the prompts.  Our office hours are 8:00 a.m. and 4:30 p.m. Monday - Friday.  Please note that voicemails left after 4:00 p.m. may not be returned until the following business day.  We are closed weekends and major holidays.  You do have access to a nurse 24-7,  just call the main number to the clinic [phone] and do not press any options, hold on the line and a nurse will answer the phone.    For prescription refill requests, have your pharmacy contact our office and allow 72 hours.    Due to Covid, you will need to wear a mask upon entering the hospital. If you do not have a mask, a mask will be given to you at the Main Entrance upon arrival. For doctor visits, patients may have 1 support person age 31 or older with them. For treatment visits, patients can not have anyone with them due to social distancing guidelines and our immunocompromised population.

## 2022-12-07 ENCOUNTER — Encounter: Payer: Self-pay | Admitting: Internal Medicine

## 2022-12-15 DIAGNOSIS — J069 Acute upper respiratory infection, unspecified: Secondary | ICD-10-CM | POA: Diagnosis not present

## 2022-12-15 DIAGNOSIS — R030 Elevated blood-pressure reading, without diagnosis of hypertension: Secondary | ICD-10-CM | POA: Diagnosis not present

## 2022-12-15 DIAGNOSIS — R0602 Shortness of breath: Secondary | ICD-10-CM | POA: Diagnosis not present

## 2022-12-15 DIAGNOSIS — R059 Cough, unspecified: Secondary | ICD-10-CM | POA: Diagnosis not present

## 2023-01-03 DIAGNOSIS — N3020 Other chronic cystitis without hematuria: Secondary | ICD-10-CM | POA: Diagnosis not present

## 2023-01-23 ENCOUNTER — Emergency Department (HOSPITAL_COMMUNITY): Payer: Medicare Other

## 2023-01-23 ENCOUNTER — Encounter (HOSPITAL_COMMUNITY): Payer: Self-pay

## 2023-01-23 ENCOUNTER — Other Ambulatory Visit: Payer: Self-pay

## 2023-01-23 ENCOUNTER — Emergency Department (HOSPITAL_COMMUNITY)
Admission: EM | Admit: 2023-01-23 | Discharge: 2023-01-23 | Disposition: A | Discharge status: Home / Self Care | Payer: Medicare Other | Attending: Emergency Medicine | Admitting: Emergency Medicine

## 2023-01-23 DIAGNOSIS — R009 Unspecified abnormalities of heart beat: Secondary | ICD-10-CM | POA: Diagnosis not present

## 2023-01-23 DIAGNOSIS — R079 Chest pain, unspecified: Secondary | ICD-10-CM | POA: Diagnosis not present

## 2023-01-23 DIAGNOSIS — Z7901 Long term (current) use of anticoagulants: Secondary | ICD-10-CM | POA: Insufficient documentation

## 2023-01-23 DIAGNOSIS — I719 Aortic aneurysm of unspecified site, without rupture: Secondary | ICD-10-CM | POA: Diagnosis not present

## 2023-01-23 DIAGNOSIS — R0789 Other chest pain: Secondary | ICD-10-CM | POA: Diagnosis not present

## 2023-01-23 DIAGNOSIS — J90 Pleural effusion, not elsewhere classified: Secondary | ICD-10-CM | POA: Diagnosis not present

## 2023-01-23 LAB — BASIC METABOLIC PANEL
Anion gap: 10 (ref 5–15)
BUN: 14 mg/dL (ref 8–23)
CO2: 23 mmol/L (ref 22–32)
Calcium: 8.9 mg/dL (ref 8.9–10.3)
Chloride: 102 mmol/L (ref 98–111)
Creatinine, Ser: 0.84 mg/dL (ref 0.44–1.00)
GFR, Estimated: >60 mL/min (ref >60)
Glucose, Bld: 185 mg/dL — ABNORMAL HIGH (ref 70–99)
Potassium: 4 mmol/L (ref 3.5–5.1)
Sodium: 135 mmol/L (ref 135–145)

## 2023-01-23 LAB — CBC WITH DIFFERENTIAL/PLATELET
Abs Immature Granulocytes: 0.05 10*3/uL (ref 0.00–0.07)
Basophils Absolute: 0.1 10*3/uL (ref 0.0–0.1)
Basophils Relative: 0 %
Eosinophils Absolute: 0.1 10*3/uL (ref 0.0–0.5)
Eosinophils Relative: 1 %
HCT: 40.1 % (ref 36.0–46.0)
Hemoglobin: 13 g/dL (ref 12.0–15.0)
Immature Granulocytes: 0 %
Lymphocytes Relative: 20 %
Lymphs Abs: 2.9 10*3/uL (ref 0.7–4.0)
MCH: 29.5 pg (ref 26.0–34.0)
MCHC: 32.4 g/dL (ref 30.0–36.0)
MCV: 91.1 fL (ref 80.0–100.0)
Monocytes Absolute: 1.2 10*3/uL — ABNORMAL HIGH (ref 0.1–1.0)
Monocytes Relative: 8 %
Neutro Abs: 10.1 10*3/uL — ABNORMAL HIGH (ref 1.7–7.7)
Neutrophils Relative %: 71 %
Platelets: 294 10*3/uL (ref 150–400)
RBC: 4.4 MIL/uL (ref 3.87–5.11)
RDW: 14 % (ref 11.5–15.5)
WBC: 14.3 10*3/uL — ABNORMAL HIGH (ref 4.0–10.5)
nRBC: 0 % (ref 0.0–0.2)

## 2023-01-23 LAB — TROPONIN I (HIGH SENSITIVITY)
Troponin I (High Sensitivity): 4 ng/L (ref <18)
Troponin I (High Sensitivity): 4 ng/L (ref <18)

## 2023-01-23 MED ORDER — ONDANSETRON HCL 4 MG/2ML IJ SOLN
4.0000 mg | Freq: Once | INTRAMUSCULAR | Status: AC
  Administered 2023-01-23: 4 mg via INTRAVENOUS
  Filled 2023-01-23: qty 2

## 2023-01-23 MED ORDER — IOHEXOL 350 MG/ML SOLN
100.0000 mL | Freq: Once | INTRAVENOUS | Status: AC | PRN
  Administered 2023-01-23: 100 mL via INTRAVENOUS

## 2023-01-23 MED ORDER — MORPHINE SULFATE (PF) 4 MG/ML IV SOLN
4.0000 mg | Freq: Once | INTRAVENOUS | Status: AC
  Administered 2023-01-23: 4 mg via INTRAVENOUS
  Filled 2023-01-23: qty 1

## 2023-01-23 NOTE — ED Notes (Signed)
Patient transported to CT 

## 2023-01-23 NOTE — Progress Notes (Deleted)
GI Office Note    Referring Provider: Manon Hilding, MD Primary Care Physician:  Manon Hilding, MD  Primary Gastroenterologist: Garfield Cornea, MD   Chief Complaint   No chief complaint on file.   History of Present Illness   Monica [name] is a 82 y.o. female presenting today   Last seen in 12/2021.    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  Completed surveillance EGDs November 2017, August 2019, recent EGD as outlined below.  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 Completed colonoscopy January 2020 showing diverticulosis, 3 polyps removed which were tubular adenomas.  No colitis.  No future colonoscopies for surveillance purposes planned due to age.  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 Completed EGD March 2022 for history of dysphagia.  Noted to have large hiatal hernia with half the stomach above the diaphragm, moderate Schatzki ring status post dilation.      Medications   Current Outpatient Medications  Medication Sig Dispense Refill   acetaminophen (TYLENOL) 325 MG tablet Take 650 mg by mouth every 6 (six) hours as needed for moderate pain or headache.     amLODipine (NORVASC) 5 MG tablet TAKE 1 TABLET BY MOUTH DAILY 100 tablet 2   apixaban (ELIQUIS) 5 MG TABS tablet TAKE 1 TABLET BY MOUTH TWICE  DAILY 200 tablet 1   citalopram (CELEXA) 20 MG tablet Take 20 mg by mouth daily.      losartan (COZAAR) 50 MG tablet Take 50 mg by mouth daily.     metFORMIN (GLUCOPHAGE-XR) 500 MG 24 hr tablet Take 500 mg by mouth daily with breakfast.     metoprolol tartrate (LOPRESSOR) 25 MG tablet Take 1 tablet (25 mg total) by mouth 2 (two) times daily. 180 tablet 3   Multiple Vitamin (MULTIVITAMIN WITH MINERALS) TABS tablet Take 1  tablet by mouth daily.     pantoprazole (PROTONIX) 40 MG tablet Take 1 tablet (40 mg total) by mouth daily. 90 tablet 3   pioglitazone (ACTOS) 15 MG tablet Take 15 mg by mouth daily.     Polyvinyl Alcohol-Povidone PF 1.4-0.6 % SOLN Place 1 drop into both eyes as needed (Dry eyes).      pravastatin (PRAVACHOL) 40 MG tablet Take 40 mg by mouth at bedtime.      No current facility-administered medications for this visit.    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   Dysphagia    Dysrhythmia    History of palpatations   GERD (gastroesophageal reflux disease)    INGESTION    Heart palpitations    Hiatal hernia    Hyperlipidemia    Interstitial cystitis    Iron deficiency 04/28/2020   Lung cancer, lower lobe ([place]) 07/21/2018   Left lower lobe, stage Ia squamous cell carcinoma   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 Lindalou Hose 2009: Normal per PCP notes   COLONOSCOPY N/A 11/29/2018   [place]: Diverticulosis, 3 polyps removed.  No evidence of colitis.  Tubular adenomas.  No future surveillance colonoscopies recommended due to age.   ESOPHAGOGASTRODUODENOSCOPY     RMR:  Prominant Schzgzki ring/component of peptic stricture status post dilation and disruption as described above, otherwise norma esophagus, moderate-sized hiatal hernia, antal pyloric channel, and posterier bulbar erosions, otherwise unremarkable stomach, D1 and D2 . Inflammatory findings on the stomach and duodenum will likely be related to aspirin effect. We need to rule out Helicobacter pylor   ESOPHAGOGASTRODUODENOSCOPY N/A 03/10/2015   Dr. 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 Pathology with lymphoid population of stomach, slight atypia   ESOPHAGOGASTRODUODENOSCOPY N/A 02/03/2016   Dr. Gala Romney: Schatzki ring noted at GE junction, dilated with 21 and then 53 French Maloney dilator. Large hiatal hernia. 6 x 7 cm nodular geographically ulcerated mucosa, biopsy c/w MALToma   ESOPHAGOGASTRODUODENOSCOPY N/A 04/08/2016   Dr. Gala Romney: moderate Schatzki's ring/web s/p dilation, large hiatal hernia, localized area of gastric lymphoma visualized and appeared to be much improved. normal second portion of the duodenum. No specimens collected. Esophageal lumen notably tighted up significantly since her dilation in April of this year.    ESOPHAGOGASTRODUODENOSCOPY N/A 08/04/2016   Procedure: ESOPHAGOGASTRODUODENOSCOPY (EGD);  Surgeon: Daneil Dolin, MD;  [facility];  Service: Endoscopy;  Laterality: N/A;  7:30 am   ESOPHAGOGASTRODUODENOSCOPY N/A 09/01/2016   Procedure: ESOPHAGOGASTRODUODENOSCOPY (EGD);  Surgeon: Daneil Dolin, MD;  [facility];  Service: Endoscopy;  Laterality: N/A;  830    ESOPHAGOGASTRODUODENOSCOPY N/A 05/10/2017   Dr. Gala Romney: Moderate Schatzki ring at the GE junction, status post dilation with 37 [place].  Medium sized hiatal hernia.  Few localized erosions in the gastric antrum.  Stomach biopsy showed chronic gastritis, no H. pylori.  No atypical lymphoid infiltrates or other features of lymphoproliferative  process   ESOPHAGOGASTRODUODENOSCOPY N/A 06/13/2018   Dr. Gala Romney: Schatzki ring status post dilation.  Large hiatal hernia.  Focal area 4 x 4 cm along the greater curvature, somewhat erythematous and thickened mucosa, biopsy benign.  Next EGD in August 2020.   ESOPHAGOGASTRODUODENOSCOPY N/A 01/14/2021   Procedure: ESOPHAGOGASTRODUODENOSCOPY (EGD);  Surgeon: Daneil Dolin, MD;  [facility];  Service: Endoscopy;  Laterality: N/A;  AM   EYE SURGERY     INTERCOSTAL NERVE BLOCK Right 09/10/2019   Procedure: Intercostal Nerve Block;  Surgeon: [name], MD;  Location: [facility];  Service: Thoracic;  Laterality: Right;   LOBECTOMY Left 05/19/2018   Procedure: LEFT LOWER LOBECTOMY;  Surgeon: [name], MD;  Location: [facility];  Service: Thoracic;  Laterality: Left;   LYMPH NODE DISSECTION Right 09/10/2019   Procedure: Lymph Node Dissection;  Surgeon: [name], MD;  Location: [facility];  Service: Thoracic;  Laterality: Right;   MALONEY DILATION N/A  03/10/2015   Procedure: Venia Minks DILATION;  Surgeon: Daneil Dolin, MD;  [facility];  Service: Endoscopy;  Laterality: N/A;   MALONEY DILATION N/A 02/03/2016   Procedure: Venia Minks DILATION;  Surgeon: Daneil Dolin, MD;  [facility];  Service: Endoscopy;  Laterality: N/A;   MALONEY DILATION N/A 04/08/2016   Procedure: Venia Minks DILATION;  Surgeon: Daneil Dolin, MD;  [facility];  Service: Endoscopy;  Laterality: N/A;   MALONEY DILATION N/A 05/10/2017   Procedure: Venia Minks DILATION;  Surgeon: Daneil Dolin, MD;  [facility];  Service: Endoscopy;  Laterality: N/A;   MALONEY DILATION N/A 06/13/2018   Procedure: Venia Minks DILATION;  Surgeon: Daneil Dolin, MD;  [facility];  Service: Endoscopy;  Laterality: N/A;   MALONEY DILATION N/A 01/14/2021   Procedure: Venia Minks DILATION;  Surgeon: Daneil Dolin, MD;  [facility];  Service: Endoscopy;  Laterality: N/A;    POLYPECTOMY  11/29/2018   Procedure: POLYPECTOMY;  Surgeon: Daneil Dolin, MD;  [facility];  Service: Endoscopy;;   VIDEO ASSISTED THORACOSCOPY (VATS)/ LOBECTOMY Right 09/10/2019   Procedure: VIDEO ASSISTED THORACOSCOPY (VATS)/RIGHT LOWER LOBECTOMY;  Surgeon: [name], MD;  Location: [facility];  Service: Thoracic;  Laterality: Right;   VIDEO ASSISTED THORACOSCOPY (VATS)/WEDGE RESECTION Left 05/19/2018   Procedure: VIDEO ASSISTED THORACOSCOPY (VATS)/WEDGE RESECTION;  Surgeon: [name], MD;  Location: [facility];  Service: Thoracic;  Laterality: Left;    Past Family History   Family History  Problem Relation Age of Onset   Heart attack Father    Dementia Father    Diabetes Sister    Diabetes Brother    Diabetes Sister    Colon cancer Neg Hx     Past Social History   Social History   Socioeconomic History   Marital status: Married    Spouse name: Not on file   Number of children: Not on file   Years of education: Not on file   Highest education level: Not on file  Occupational History   Not on file  Tobacco Use   Smoking status: Former    Packs/day: 0.50    Years: 57.00    Additional pack years: 0.00    Total pack years: 28.50    Types: Cigarettes    Quit date: 08/01/2022    Years since quitting: 0.4   Smokeless tobacco: Never   Tobacco comments:    trying to quit, wearing patch  Vaping Use   Vaping Use: Never used  Substance and Sexual Activity   Alcohol use: No    Alcohol/week: 0.0 standard drinks of alcohol   Drug use: No   Sexual activity: Not Currently  Other Topics Concern   Not on file  Social History Narrative   Not on file   Social Determinants of Health   Financial Resource Strain: Not on file  Food Insecurity: Not on file  Transportation Needs: Not on file  Physical Activity: Not on file  Stress: Not on file  Social Connections: Not on file  Intimate Partner Violence: Not on file    Review of Systems   General:  Negative for anorexia, weight loss, fever, chills, fatigue, weakness. ENT: Negative for hoarseness, difficulty swallowing , nasal congestion. CV: Negative for chest pain, angina, palpitations, dyspnea on exertion, peripheral edema.  Respiratory: Negative for dyspnea at rest, dyspnea on exertion, cough, sputum, wheezing.  GI: See history of present illness. GU:  Negative for  dysuria, hematuria, urinary incontinence, urinary frequency, nocturnal urination.  Endo: Negative for unusual weight change.     Physical Exam   There were no vitals taken for this visit.   General: Well-nourished, well-developed in no acute distress.  Eyes: No icterus. Mouth: Oropharyngeal mucosa moist and pink , no lesions erythema or exudate. Lungs: Clear to auscultation bilaterally.  Heart: Regular rate and rhythm, no murmurs rubs or gallops.  Abdomen: Bowel sounds are normal, nontender, nondistended, no hepatosplenomegaly or masses,  no abdominal bruits or hernia , no rebound or guarding.  Rectal: ***  Extremities: No lower extremity edema. No clubbing or deformities. Neuro: Alert and oriented x 4   Skin: Warm and dry, no jaundice.   Psych: Alert and cooperative, normal mood and affect.  Labs   Lab Results  Component Value Date   CREATININE 0.84 01/23/2023   BUN 14 01/23/2023   NA 135 01/23/2023   K 4.0 01/23/2023   CL 102 01/23/2023   CO2 23 01/23/2023   Lab Results  Component Value Date   WBC 14.3 (H) 01/23/2023   HGB 13.0 01/23/2023   HCT 40.1 01/23/2023   MCV 91.1 01/23/2023   PLT 294 01/23/2023   Lab Results  Component Value Date   IRON 66 11/18/2022   TIBC 341 11/18/2022   FERRITIN 27 11/18/2022   Lab Results  Component Value Date   ALT 18 11/18/2022   AST 25 11/18/2022   ALKPHOS 58 11/18/2022   BILITOT 0.3 11/18/2022    Imaging Studies   CT ANGIO CHEST/ABD/PEL FOR DISSECTION W &/OR WO CONTRAST  Result Date: 01/23/2023 CLINICAL DATA:  Acute aortic syndrome. EXAM: CT  ANGIOGRAPHY CHEST, ABDOMEN AND PELVIS TECHNIQUE: Non-contrast CT of the chest was initially obtained. Multidetector CT imaging through the chest, abdomen and pelvis was performed using the standard protocol during bolus administration of intravenous contrast. Multiplanar reconstructed images and MIPs were obtained and reviewed to evaluate the vascular anatomy. RADIATION DOSE REDUCTION: This exam was performed according to the departmental dose-optimization program which includes automated exposure control, adjustment of the mA and/or kV according to patient size and/or use of iterative reconstruction technique. CONTRAST:  192mL OMNIPAQUE IOHEXOL 350 MG/ML SOLN COMPARISON:  Chest CT 11/18/2022. FINDINGS: CTA CHEST FINDINGS Cardiovascular: Pre contrast imaging shows no hyperdense crescent in the wall of the thoracic aorta to suggest the presence of an acute intramural hematoma. Imaging after IV contrast administration shows no thoracic aortic aneurysm. No dissection of the thoracic aorta. Enlargement of the pulmonary outflow tract/main pulmonary arteries suggests pulmonary arterial hypertension. No central pulmonary embolus in the pulmonary outflow tract or main/lobar pulmonary arteries of either lung. Mediastinum/Nodes: No mediastinal lymphadenopathy. There is no hilar lymphadenopathy. Large hiatal hernia. There is no axillary lymphadenopathy. Lungs/Pleura: Centrilobular emphsyema noted. Status post bilateral lower lobectomies. No suspicious pulmonary nodule or mass. Atelectasis noted left base adjacent to the hiatal hernia. Tiny bilateral pleural effusions evident. Musculoskeletal: No worrisome lytic or sclerotic osseous abnormality. Review of the MIP images confirms the above findings. CTA ABDOMEN AND PELVIS FINDINGS VASCULAR Aorta: Normal caliber aorta without aneurysm, dissection, vasculitis or significant stenosis. Moderate atherosclerosis. Celiac: Patent without evidence of aneurysm, dissection, vasculitis or  significant stenosis. SMA: Patent without evidence of aneurysm, dissection, vasculitis or significant stenosis. Renals: Both renal arteries are patent without evidence of aneurysm, dissection, vasculitis, fibromuscular dysplasia or significant stenosis. IMA: Patent without evidence of aneurysm, dissection, vasculitis or significant stenosis. Inflow: Patent without evidence of aneurysm, dissection, vasculitis or significant stenosis. Veins: No  obvious venous abnormality within the limitations of this arterial phase study. Review of the MIP images confirms the above findings. NON-VASCULAR Hepatobiliary: No suspicious focal abnormality within the liver parenchyma. There is no evidence for gallstones, gallbladder wall thickening, or pericholecystic fluid. No intrahepatic or extrahepatic biliary dilation. Pancreas: No focal mass lesion. No dilatation of the main duct. No intraparenchymal cyst. No peripancreatic edema. Spleen: No splenomegaly. No focal mass lesion. Adrenals/Urinary Tract: No adrenal nodule or mass. Cortical scarring noted both kidneys. No substantial change in right lower pole renal cyst. No followup imaging is recommended. No evidence for hydroureter. The urinary bladder appears normal for the degree of distention. Stomach/Bowel: Large hiatal hernia. Duodenum is normally positioned as is the ligament of Treitz. Duodenal diverticulum noted. No small bowel wall thickening. No small bowel dilatation. The terminal ileum is normal. The appendix is normal. Diverticuli are seen scattered along the entire length of the colon without CT findings of diverticulitis. Lymphatic: There is no gastrohepatic or hepatoduodenal ligament lymphadenopathy. No retroperitoneal or mesenteric lymphadenopathy. No pelvic sidewall lymphadenopathy. Reproductive: Hysterectomy.  There is no adnexal mass. Other: No intraperitoneal free fluid. Musculoskeletal: No worrisome lytic or sclerotic osseous abnormality. Review of the MIP images  confirms the above findings. IMPRESSION: 1. No evidence for acute intramural hematoma or dissection of the thoracoabdominal aorta. 2. Enlargement of the pulmonary outflow tract/main pulmonary arteries suggests pulmonary arterial hypertension. 3. Large hiatal hernia. 4. Status post bilateral lower lobectomies. 5. Tiny bilateral pleural effusions. 6. Diffuse colonic diverticulosis without diverticulitis. Electronically Signed   By: Misty Stanley M.D.   On: 01/23/2023 07:10   DG Chest Port 1 View  Result Date: 01/23/2023 CLINICAL DATA:  Severe radiating chest pain. EXAM: PORTABLE CHEST 1 VIEW COMPARISON:  12/15/2022 FINDINGS: The cardio pericardial silhouette is enlarged. There is pulmonary vascular congestion without overt pulmonary edema. Left base collapse/consolidation noted with small left pleural effusion. Bones are diffusely demineralized. Telemetry leads overlie the chest. IMPRESSION: 1. Enlarged cardiopericardial silhouette with pulmonary vascular congestion. 2. Left base collapse/consolidation with small left pleural effusion. 3. Known large hiatal hernia not well demonstrated on the current study. Electronically Signed   By: Misty Stanley M.D.   On: 01/23/2023 05:33    Assessment       PLAN   ***   Laureen Ochs. Bobby Rumpf, [place], [facility]

## 2023-01-23 NOTE — ED Triage Notes (Signed)
Pt arrived from home via POV c/o CP that woke her up from sleep. Pt reports generalized chest pain, back pain and nausea accompanied with BP at home of 180/120.

## 2023-01-23 NOTE — ED Provider Notes (Signed)
[facility] Provider Note   CSN: [id number] Arrival date & time: 01/23/23  0416     History  Chief Complaint  Patient presents with   Chest Pain    Monica [name] is a 82 y.o. female.  Patient presents to the emergency department for evaluation of chest pain.  Patient reports that she was awakened from sleep around 2 AM with pain in the center of her chest.  Patient reports the pain goes through into her back and is associated with nausea.  She reports a history of atrial fibrillation, no known history of CAD.  There is no associated shortness of breath.       Home Medications Prior to Admission medications   Medication Sig Start Date End Date Taking? Authorizing Provider  acetaminophen (TYLENOL) 325 MG tablet Take 650 mg by mouth every 6 (six) hours as needed for moderate pain or headache.    [provider]  amLODipine (NORVASC) 5 MG tablet TAKE 1 TABLET BY MOUTH DAILY 09/10/22   Arnoldo Lenis, MD  apixaban (ELIQUIS) 5 MG TABS tablet TAKE 1 TABLET BY MOUTH TWICE  DAILY 06/09/22   Arnoldo Lenis, MD  citalopram (CELEXA) 20 MG tablet Take 20 mg by mouth daily.  12/10/14   [provider]  losartan (COZAAR) 50 MG tablet Take 50 mg by mouth daily.    [provider]  metFORMIN (GLUCOPHAGE-XR) 500 MG 24 hr tablet Take 500 mg by mouth daily with breakfast. 12/10/14   [provider]  metoprolol tartrate (LOPRESSOR) 25 MG tablet Take 1 tablet (25 mg total) by mouth 2 (two) times daily. 11/08/22   Arnoldo Lenis, MD  Multiple Vitamin (MULTIVITAMIN WITH MINERALS) TABS tablet Take 1 tablet by mouth daily.    [provider]  pantoprazole (PROTONIX) 40 MG tablet Take 1 tablet (40 mg total) by mouth daily. 01/14/21   Rourk, Cristopher Estimable, MD  pioglitazone (ACTOS) 15 MG tablet Take 15 mg by mouth daily.    [provider]  Polyvinyl Alcohol-Povidone PF 1.4-0.6 % SOLN Place 1 drop into both eyes  as needed (Dry eyes).     [provider]  pravastatin (PRAVACHOL) 40 MG tablet Take 40 mg by mouth at bedtime.  07/12/16   [provider]      Allergies    Elemental sulfur    Review of Systems   Review of Systems  Physical Exam Updated Vital Signs BP (!) 145/84   Pulse 75   Temp 98.2 F (36.8 C) (Oral)   Resp 16   Ht 5\' 6"  (1.676 m)   Wt 77.1 kg   SpO2 92%   BMI 27.44 kg/m  Physical Exam Vitals and nursing note reviewed.  Constitutional:      General: She is not in acute distress.    Appearance: She is well-developed.  HENT:     Head: Normocephalic and atraumatic.     Mouth/Throat:     Mouth: Mucous membranes are moist.  Eyes:     General: Vision grossly intact. Gaze aligned appropriately.     Extraocular Movements: Extraocular movements intact.     Conjunctiva/sclera: Conjunctivae normal.  Cardiovascular:     Rate and Rhythm: Normal rate. Rhythm irregular.     Pulses: Normal pulses.     Heart sounds: Normal heart sounds, S1 normal and S2 normal. No murmur heard.    No friction rub. No gallop.  Pulmonary:     Effort:  Pulmonary effort is normal. No respiratory distress.     Breath sounds: Normal breath sounds.  Abdominal:     General: Bowel sounds are normal.     Palpations: Abdomen is soft.     Tenderness: There is no abdominal tenderness. There is no guarding or rebound.     Hernia: No hernia is present.  Musculoskeletal:        General: No swelling.     Cervical back: Full passive range of motion without pain, normal range of motion and neck supple. No spinous process tenderness or muscular tenderness. Normal range of motion.     Right lower leg: No edema.     Left lower leg: No edema.  Skin:    General: Skin is warm and dry.     Capillary Refill: Capillary refill takes less than 2 seconds.     Findings: No ecchymosis, erythema, rash or wound.  Neurological:     General: No focal deficit present.     Mental Status: She is alert and  oriented to person, place, and time.     GCS: GCS eye subscore is 4. GCS verbal subscore is 5. GCS motor subscore is 6.     Cranial Nerves: Cranial nerves 2-12 are intact.     Sensory: Sensation is intact.     Motor: Motor function is intact.     Coordination: Coordination is intact.  Psychiatric:        Attention and Perception: Attention normal.        Mood and Affect: Mood normal.        Speech: Speech normal.        Behavior: Behavior normal.     ED Results / Procedures / Treatments   Labs (all labs ordered are listed, but only abnormal results are displayed) Labs Reviewed  CBC WITH DIFFERENTIAL/PLATELET - Abnormal; Notable for the following components:      Result Value   WBC 14.3 (*)    Neutro Abs 10.1 (*)    Monocytes Absolute 1.2 (*)    All other components within normal limits  BASIC METABOLIC PANEL - Abnormal; Notable for the following components:   Glucose, Bld 185 (*)    All other components within normal limits  TROPONIN I (HIGH SENSITIVITY)  TROPONIN I (HIGH SENSITIVITY)    EKG EKG Interpretation  Date/Time:  Sunday January 23 2023 04:30:07 EDT Ventricular Rate:  85 PR Interval:    QRS Duration: 130 QT Interval:  407 QTC Calculation: 470 R Axis:   112 Text Interpretation: Atrial fibrillation Right bundle branch block Baseline wander in lead(s) I III aVR aVL aVF No significant change since last tracing Confirmed by Orpah Greek [id number]) on 01/23/2023 4:37:30 AM  Radiology CT ANGIO CHEST/ABD/PEL FOR DISSECTION W &/OR WO CONTRAST  Result Date: 01/23/2023 CLINICAL DATA:  Acute aortic syndrome. EXAM: CT ANGIOGRAPHY CHEST, ABDOMEN AND PELVIS TECHNIQUE: Non-contrast CT of the chest was initially obtained. Multidetector CT imaging through the chest, abdomen and pelvis was performed using the standard protocol during bolus administration of intravenous contrast. Multiplanar reconstructed images and MIPs were obtained and reviewed to evaluate the vascular  anatomy. RADIATION DOSE REDUCTION: This exam was performed according to the departmental dose-optimization program which includes automated exposure control, adjustment of the mA and/or kV according to patient size and/or use of iterative reconstruction technique. CONTRAST:  141mL OMNIPAQUE IOHEXOL 350 MG/ML SOLN COMPARISON:  Chest CT 11/18/2022. FINDINGS: CTA CHEST FINDINGS Cardiovascular: Pre contrast imaging shows no hyperdense crescent in the  wall of the thoracic aorta to suggest the presence of an acute intramural hematoma. Imaging after IV contrast administration shows no thoracic aortic aneurysm. No dissection of the thoracic aorta. Enlargement of the pulmonary outflow tract/main pulmonary arteries suggests pulmonary arterial hypertension. No central pulmonary embolus in the pulmonary outflow tract or main/lobar pulmonary arteries of either lung. Mediastinum/Nodes: No mediastinal lymphadenopathy. There is no hilar lymphadenopathy. Large hiatal hernia. There is no axillary lymphadenopathy. Lungs/Pleura: Centrilobular emphsyema noted. Status post bilateral lower lobectomies. No suspicious pulmonary nodule or mass. Atelectasis noted left base adjacent to the hiatal hernia. Tiny bilateral pleural effusions evident. Musculoskeletal: No worrisome lytic or sclerotic osseous abnormality. Review of the MIP images confirms the above findings. CTA ABDOMEN AND PELVIS FINDINGS VASCULAR Aorta: Normal caliber aorta without aneurysm, dissection, vasculitis or significant stenosis. Moderate atherosclerosis. Celiac: Patent without evidence of aneurysm, dissection, vasculitis or significant stenosis. SMA: Patent without evidence of aneurysm, dissection, vasculitis or significant stenosis. Renals: Both renal arteries are patent without evidence of aneurysm, dissection, vasculitis, fibromuscular dysplasia or significant stenosis. IMA: Patent without evidence of aneurysm, dissection, vasculitis or significant stenosis. Inflow:  Patent without evidence of aneurysm, dissection, vasculitis or significant stenosis. Veins: No obvious venous abnormality within the limitations of this arterial phase study. Review of the MIP images confirms the above findings. NON-VASCULAR Hepatobiliary: No suspicious focal abnormality within the liver parenchyma. There is no evidence for gallstones, gallbladder wall thickening, or pericholecystic fluid. No intrahepatic or extrahepatic biliary dilation. Pancreas: No focal mass lesion. No dilatation of the main duct. No intraparenchymal cyst. No peripancreatic edema. Spleen: No splenomegaly. No focal mass lesion. Adrenals/Urinary Tract: No adrenal nodule or mass. Cortical scarring noted both kidneys. No substantial change in right lower pole renal cyst. No followup imaging is recommended. No evidence for hydroureter. The urinary bladder appears normal for the degree of distention. Stomach/Bowel: Large hiatal hernia. Duodenum is normally positioned as is the ligament of Treitz. Duodenal diverticulum noted. No small bowel wall thickening. No small bowel dilatation. The terminal ileum is normal. The appendix is normal. Diverticuli are seen scattered along the entire length of the colon without CT findings of diverticulitis. Lymphatic: There is no gastrohepatic or hepatoduodenal ligament lymphadenopathy. No retroperitoneal or mesenteric lymphadenopathy. No pelvic sidewall lymphadenopathy. Reproductive: Hysterectomy.  There is no adnexal mass. Other: No intraperitoneal free fluid. Musculoskeletal: No worrisome lytic or sclerotic osseous abnormality. Review of the MIP images confirms the above findings. IMPRESSION: 1. No evidence for acute intramural hematoma or dissection of the thoracoabdominal aorta. 2. Enlargement of the pulmonary outflow tract/main pulmonary arteries suggests pulmonary arterial hypertension. 3. Large hiatal hernia. 4. Status post bilateral lower lobectomies. 5. Tiny bilateral pleural effusions. 6.  Diffuse colonic diverticulosis without diverticulitis. Electronically Signed   By: Misty Stanley M.D.   On: 01/23/2023 07:10   DG Chest Port 1 View  Result Date: 01/23/2023 CLINICAL DATA:  Severe radiating chest pain. EXAM: PORTABLE CHEST 1 VIEW COMPARISON:  12/15/2022 FINDINGS: The cardio pericardial silhouette is enlarged. There is pulmonary vascular congestion without overt pulmonary edema. Left base collapse/consolidation noted with small left pleural effusion. Bones are diffusely demineralized. Telemetry leads overlie the chest. IMPRESSION: 1. Enlarged cardiopericardial silhouette with pulmonary vascular congestion. 2. Left base collapse/consolidation with small left pleural effusion. 3. Known large hiatal hernia not well demonstrated on the current study. Electronically Signed   By: Misty Stanley M.D.   On: 01/23/2023 05:33    Procedures Procedures    Medications Ordered in ED Medications  morphine (PF) 4 MG/ML  injection 4 mg (4 mg Intravenous Given 01/23/23 0501)  ondansetron (ZOFRAN) injection 4 mg (4 mg Intravenous Given 01/23/23 0501)  iohexol (OMNIPAQUE) 350 MG/ML injection 100 mL (100 mLs Intravenous Contrast Given 01/23/23 [id number])    ED Course/ Medical Decision Making/ A&P                             Medical Decision Making Amount and/or Complexity of Data Reviewed Labs: ordered. Radiology: ordered.  Risk Prescription drug management.   Differential Diagnosis considered includes, but not limited to: STEMI; NSTEMI; myocarditis; pericarditis; pulmonary embolism; aortic dissection; pneumothorax; pneumonia; gastritis.   Patient presents to the emergency department for evaluation of chest pain.  Patient reports persistent pain in the central chest that woke her from sleep.  Patient has a history of atrial fibrillation, no known history of CAD.  Patient is in rate controlled A-fib.  EKG at arrival does not show ischemia or infarct.  Patient has had 2 negative  troponins.  Patient indicates that the pain radiates to the center of her back, straight through her chest.  Based on this, aortic dissection is considered.  Patient underwent CT angiography of chest, abdomen, pelvis.  No abnormality noted.  No aortic dissection, no PE.  No pathology noted to explain the patient's pain.        Final Clinical Impression(s) / ED Diagnoses Final diagnoses:  Chest pain, unspecified type    Rx / DC Orders ED Discharge Orders          Ordered    Ambulatory referral to Cardiology       Comments: If you have not heard from the Cardiology office within the next 72 hours please call [phone].   01/23/23 [id number]              Orpah Greek, MD 01/23/23 [phone]

## 2023-01-24 ENCOUNTER — Ambulatory Visit: Payer: Medicare Other | Admitting: Gastroenterology

## 2023-01-26 ENCOUNTER — Ambulatory Visit: Payer: Medicare Other | Admitting: Gastroenterology

## 2023-01-26 ENCOUNTER — Encounter: Payer: Self-pay | Admitting: Gastroenterology

## 2023-01-26 VITALS — BP 142/82 | HR 94 | Temp 98.0°F | Ht 66.0 in | Wt 177.0 lb

## 2023-01-26 DIAGNOSIS — R1310 Dysphagia, unspecified: Secondary | ICD-10-CM | POA: Diagnosis not present

## 2023-01-26 DIAGNOSIS — A09 Infectious gastroenteritis and colitis, unspecified: Secondary | ICD-10-CM | POA: Diagnosis not present

## 2023-01-26 DIAGNOSIS — R1030 Lower abdominal pain, unspecified: Secondary | ICD-10-CM | POA: Diagnosis not present

## 2023-01-26 DIAGNOSIS — K219 Gastro-esophageal reflux disease without esophagitis: Secondary | ICD-10-CM

## 2023-01-26 DIAGNOSIS — K449 Diaphragmatic hernia without obstruction or gangrene: Secondary | ICD-10-CM

## 2023-01-26 NOTE — Progress Notes (Signed)
GI Office Note    Referring Provider: Manon Hilding, MD Primary Care Physician:  Manon Hilding, MD  Primary Gastroenterologist: Garfield Cornea, MD   Chief Complaint   Chief Complaint  Patient presents with   Abdominal Pain    Chronic abdominal pain   Dysphagia    Trouble swallowing for the past 6 mths.   Diarrhea    Chronic diarrhea    History of Present Illness   Monica [name] is a 82 y.o. female presenting today for further evaluation of abdominal pain, diarrhea, dysphagia. Last seen in March 2023.  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  Completed surveillance EGDs November 2017, August 2019, most recent EGD 12/2020 for history of dysphagia and noted to have large hiatal hernia with half of the stomach above the diaphragm, moderate Schatzki ring status post dilation.  Oncology advised 5 years of surveillance EGDs.  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 Completed colonoscopy January 2020 showing diverticulosis, 3 polyps removed which were tubular adenomas.  No colitis.  No future colonoscopies for surveillance purposes planned due to age. 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   Recently seen in the ED on March 24 with chest pain.  It woke her up from sleep around 2 AM in the morning, center of chest.  Radiated into the upper back and associated with nausea.  CTA chest/abdomen/pelvis, cardiac enzymes unremarkable. She has follow up appt with cardiology, Dr. Annamaria Boots, in [place] next month.  Today:  For six months, gets to a certain point and can't swallow any more. Can eat only a small amount of at a time. Feels like food is feeling up in the esophagus and not moving. Has had some episodes of vomiting and/or regurgitation. No  heartburn.   Lower abdominal pain. For past year. Intermittent but daily. Not necessarily better with BMs or worse with meals but notes after meals she feels sick and nauseated. Doesn't necessarily worsen her abdominal pain.   Diarrhea an issue again, going on since her last ov a year ago. Stools are always loose, semi-formed. Typically 1-2 per day. Some days however she has uncontrollable stools, with urgency and incontinence. Occurs without warning. She is afraid she will have an episode when she is away from her house. She has had episodes while shopping, missed a funeral due to incontinence. She has woken up in the middle of the night with fecal incontinence. Urgency and incontinence may happen once every 1-2 weeks. Takes imodium after the episode. No blood in the stool. She does report recent antibiotics for PNA last month.    Medications   Current Outpatient Medications  Medication Sig Dispense Refill   apixaban (ELIQUIS) 5 MG TABS tablet TAKE 1 TABLET BY MOUTH TWICE  DAILY 200 tablet 1   cholecalciferol (VITAMIN D3) 25 MCG (1000 UNIT) tablet Take 1,000 Units by mouth daily.     citalopram (CELEXA) 20 MG tablet Take 20 mg by mouth daily.      Cranberry 125 MG TABS Take by mouth.     ferrous sulfate 325 (65 FE) MG tablet Take 325 mg by mouth daily with breakfast.     losartan (COZAAR) 50 MG tablet Take 50 mg by mouth daily.     metFORMIN (GLUCOPHAGE-XR)  500 MG 24 hr tablet Take 500 mg by mouth daily with breakfast.     metoprolol tartrate (LOPRESSOR) 25 MG tablet Take 1 tablet (25 mg total) by mouth 2 (two) times daily. 180 tablet 3   Multiple Vitamin (MULTIVITAMIN WITH MINERALS) TABS tablet Take 1 tablet by mouth daily.     pantoprazole (PROTONIX) 40 MG tablet Take 1 tablet (40 mg total) by mouth daily. 90 tablet 3   pioglitazone (ACTOS) 15 MG tablet Take 15 mg by mouth daily.     pravastatin (PRAVACHOL) 40 MG tablet Take 40 mg by mouth at bedtime.      No current facility-administered  medications for this visit.    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   Dysphagia    Dysrhythmia    History of palpatations   GERD (gastroesophageal reflux disease)    INGESTION    Heart palpitations    Hiatal hernia    Hyperlipidemia    Interstitial cystitis    Iron deficiency 04/28/2020   Lung cancer, lower lobe ([place]) 07/21/2018   Left lower lobe, stage Ia squamous cell carcinoma   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 Lindalou Hose 2009: Normal per PCP notes   COLONOSCOPY N/A 11/29/2018   [place]: Diverticulosis, 3 polyps removed.  No evidence of colitis.  Tubular adenomas.  No future surveillance colonoscopies recommended due to age.   ESOPHAGOGASTRODUODENOSCOPY     RMR: Prominant Schzgzki ring/component of peptic stricture status post dilation and disruption as described above, otherwise norma esophagus, moderate-sized hiatal hernia, antal pyloric channel, and posterier bulbar erosions, otherwise unremarkable stomach, D1 and D2 . Inflammatory findings on the stomach and duodenum will likely be related to aspirin effect. We need to rule out Helicobacter pylor    ESOPHAGOGASTRODUODENOSCOPY N/A 03/10/2015   Dr. 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 Pathology with lymphoid population of stomach, slight atypia   ESOPHAGOGASTRODUODENOSCOPY N/A 02/03/2016   Dr. Gala Romney: Schatzki ring noted at GE junction, dilated with 86 and then 81 French Maloney dilator. Large hiatal hernia. 6 x 7 cm nodular geographically ulcerated mucosa, biopsy c/w MALToma   ESOPHAGOGASTRODUODENOSCOPY N/A 04/08/2016   Dr. Gala Romney: moderate Schatzki's ring/web s/p dilation, large hiatal hernia, localized area of gastric lymphoma visualized and appeared to be much improved. normal second portion of the duodenum. No specimens collected. Esophageal lumen notably tighted up significantly since her dilation in April of this year.    ESOPHAGOGASTRODUODENOSCOPY N/A 08/04/2016   Procedure:  ESOPHAGOGASTRODUODENOSCOPY (EGD);  Surgeon: Daneil Dolin, MD;  [facility];  Service: Endoscopy;  Laterality: N/A;  7:30 am   ESOPHAGOGASTRODUODENOSCOPY N/A 09/01/2016   Procedure: ESOPHAGOGASTRODUODENOSCOPY (EGD);  Surgeon: Daneil Dolin, MD;  [facility];  Service: Endoscopy;  Laterality: N/A;  830    ESOPHAGOGASTRODUODENOSCOPY N/A 05/10/2017   Dr. Gala Romney: Moderate Schatzki ring at the GE junction, status post dilation with 49 [place].  Medium sized hiatal hernia.  Few localized erosions in the gastric antrum.  Stomach biopsy showed chronic gastritis, no H. pylori.  No atypical lymphoid infiltrates or other features of lymphoproliferative process   ESOPHAGOGASTRODUODENOSCOPY N/A 06/13/2018   Dr. Gala Romney: Schatzki ring status post dilation.  Large hiatal hernia.  Focal area 4 x 4 cm along the greater curvature, somewhat erythematous and thickened mucosa, biopsy benign.  Next EGD in August 2020.   ESOPHAGOGASTRODUODENOSCOPY N/A 01/14/2021   Procedure: ESOPHAGOGASTRODUODENOSCOPY (EGD);  Surgeon: Daneil Dolin, MD;  [facility];  Service: Endoscopy;  Laterality: N/A;  AM   EYE SURGERY     INTERCOSTAL NERVE BLOCK Right 09/10/2019   Procedure: Intercostal Nerve Block;  Surgeon: [name], MD;  Location: [facility];  Service: Thoracic;  Laterality: Right;   LOBECTOMY Left 05/19/2018   Procedure: LEFT LOWER LOBECTOMY;  Surgeon: [name], MD;  Location: [place];  Service: Thoracic;  Laterality: Left;   LYMPH NODE DISSECTION Right 09/10/2019   Procedure: Lymph Node Dissection;  Surgeon: [name], MD;  Location: [place];  Service: Thoracic;  Laterality: Right;   MALONEY DILATION N/A 03/10/2015   Procedure: Keturah Shavers;  Surgeon: Daneil Dolin, MD;  [facility];  Service: Endoscopy;  Laterality: N/A;   MALONEY DILATION N/A 02/03/2016   Procedure: Venia Minks DILATION;  Surgeon: Daneil Dolin, MD;  [facility];  Service: Endoscopy;  Laterality: N/A;   MALONEY DILATION N/A 04/08/2016   Procedure: Venia Minks DILATION;  Surgeon: Daneil Dolin, MD;  [facility];  Service: Endoscopy;  Laterality: N/A;   MALONEY DILATION N/A 05/10/2017   Procedure: Venia Minks DILATION;  Surgeon: Daneil Dolin, MD;  [facility];  Service: Endoscopy;  Laterality: N/A;   MALONEY DILATION N/A 06/13/2018   Procedure: Venia Minks DILATION;  Surgeon: Daneil Dolin, MD;  [facility];  Service: Endoscopy;  Laterality: N/A;   MALONEY DILATION N/A 01/14/2021   Procedure: Venia Minks DILATION;  Surgeon: Daneil Dolin, MD;  [facility];  Service: Endoscopy;  Laterality: N/A;   POLYPECTOMY  11/29/2018   Procedure: POLYPECTOMY;  Surgeon: Daneil Dolin, MD;  [facility];  Service: Endoscopy;;   VIDEO ASSISTED THORACOSCOPY (VATS)/ LOBECTOMY Right 09/10/2019   Procedure: VIDEO ASSISTED THORACOSCOPY (VATS)/RIGHT LOWER LOBECTOMY;  Surgeon: [name], MD;  Location: [facility];  Service: Thoracic;  Laterality: Right;   VIDEO ASSISTED THORACOSCOPY (VATS)/WEDGE  RESECTION Left 05/19/2018   Procedure: VIDEO ASSISTED THORACOSCOPY (VATS)/WEDGE RESECTION;  Surgeon: [name], MD;  Location: [facility];  Service: Thoracic;  Laterality: Left;    Past Family History   Family History  Problem Relation Age of Onset   Heart attack Father    Dementia Father    Diabetes Sister    Diabetes Brother    Diabetes Sister    Colon cancer Neg Hx     Past Social History   Social History   Socioeconomic History   Marital status: Married  Spouse name: Not on file   Number of children: Not on file   Years of education: Not on file   Highest education level: Not on file  Occupational History   Not on file  Tobacco Use   Smoking status: Former    Packs/day: 0.50    Years: 57.00    Additional pack years: 0.00    Total pack years: 28.50    Types: Cigarettes    Quit date: 08/01/2022    Years since quitting: 0.4   Smokeless tobacco: Never  Vaping Use   Vaping Use: Never used  Substance and Sexual Activity   Alcohol use: No    Alcohol/week: 0.0 standard drinks of alcohol   Drug use: No   Sexual activity: Not Currently  Other Topics Concern   Not on file  Social History Narrative   Not on file   Social Determinants of Health   Financial Resource Strain: Not on file  Food Insecurity: Not on file  Transportation Needs: Not on file  Physical Activity: Not on file  Stress: Not on file  Social Connections: Not on file  Intimate Partner Violence: Not on file    Review of Systems   General: Negative for anorexia, weight loss, fever, chills, fatigue, weakness. ENT: Negative for hoarseness, difficulty swallowing , nasal congestion. CV: Negative for chest pain, angina, palpitations, dyspnea on exertion, peripheral edema.  Respiratory: Negative for dyspnea at rest, dyspnea on exertion, cough, sputum, wheezing.  GI: See history of present illness. GU:  Negative for dysuria, hematuria, urinary incontinence, urinary frequency, nocturnal  urination.  Endo: Negative for unusual weight change.     Physical Exam   BP (!) 147/82 (BP Location: Left Arm, Patient Position: Sitting, Cuff Size: Large)   Pulse 94   Temp 98 F (36.7 C) (Oral)   Ht 5\' 6"  (1.676 m)   Wt 177 lb (80.3 kg)   SpO2 99%   BMI 28.57 kg/m    General: Well-nourished, well-developed in no acute distress.  Eyes: No icterus. Mouth: Oropharyngeal mucosa moist and pink   Lungs: Clear to auscultation bilaterally.  Heart: Regular rate and rhythm, no murmurs rubs or gallops.  Abdomen: Bowel sounds are normal, nontender, nondistended, no hepatosplenomegaly or masses,  no abdominal bruits or hernia , no rebound or guarding.  Rectal: not performed Extremities: No lower extremity edema. No clubbing or deformities. Neuro: Alert and oriented x 4   Skin: Warm and dry, no jaundice.   Psych: Alert and cooperative, normal mood and affect.  Labs   Lab Results  Component Value Date   CREATININE 0.84 01/23/2023   BUN 14 01/23/2023   NA 135 01/23/2023   K 4.0 01/23/2023   CL 102 01/23/2023   CO2 23 01/23/2023   Lab Results  Component Value Date   WBC 14.3 (H) 01/23/2023   HGB 13.0 01/23/2023   HCT 40.1 01/23/2023   MCV 91.1 01/23/2023   PLT 294 01/23/2023   Lab Results  Component Value Date   ALT 18 11/18/2022   AST 25 11/18/2022   ALKPHOS 58 11/18/2022   BILITOT 0.3 11/18/2022   Lab Results  Component Value Date   IRON 66 11/18/2022   TIBC 341 11/18/2022   FERRITIN 27 11/18/2022    Imaging Studies   CT ANGIO CHEST/ABD/PEL FOR DISSECTION W &/OR WO CONTRAST  Result Date: 01/23/2023 CLINICAL DATA:  Acute aortic syndrome. EXAM: CT ANGIOGRAPHY CHEST, ABDOMEN AND PELVIS TECHNIQUE: Non-contrast CT of the chest was initially obtained. Multidetector  CT imaging through the chest, abdomen and pelvis was performed using the standard protocol during bolus administration of intravenous contrast. Multiplanar reconstructed images and MIPs were obtained and  reviewed to evaluate the vascular anatomy. RADIATION DOSE REDUCTION: This exam was performed according to the departmental dose-optimization program which includes automated exposure control, adjustment of the mA and/or kV according to patient size and/or use of iterative reconstruction technique. CONTRAST:  160mL OMNIPAQUE IOHEXOL 350 MG/ML SOLN COMPARISON:  Chest CT 11/18/2022. FINDINGS: CTA CHEST FINDINGS Cardiovascular: Pre contrast imaging shows no hyperdense crescent in the wall of the thoracic aorta to suggest the presence of an acute intramural hematoma. Imaging after IV contrast administration shows no thoracic aortic aneurysm. No dissection of the thoracic aorta. Enlargement of the pulmonary outflow tract/main pulmonary arteries suggests pulmonary arterial hypertension. No central pulmonary embolus in the pulmonary outflow tract or main/lobar pulmonary arteries of either lung. Mediastinum/Nodes: No mediastinal lymphadenopathy. There is no hilar lymphadenopathy. Large hiatal hernia. There is no axillary lymphadenopathy. Lungs/Pleura: Centrilobular emphsyema noted. Status post bilateral lower lobectomies. No suspicious pulmonary nodule or mass. Atelectasis noted left base adjacent to the hiatal hernia. Tiny bilateral pleural effusions evident. Musculoskeletal: No worrisome lytic or sclerotic osseous abnormality. Review of the MIP images confirms the above findings. CTA ABDOMEN AND PELVIS FINDINGS VASCULAR Aorta: Normal caliber aorta without aneurysm, dissection, vasculitis or significant stenosis. Moderate atherosclerosis. Celiac: Patent without evidence of aneurysm, dissection, vasculitis or significant stenosis. SMA: Patent without evidence of aneurysm, dissection, vasculitis or significant stenosis. Renals: Both renal arteries are patent without evidence of aneurysm, dissection, vasculitis, fibromuscular dysplasia or significant stenosis. IMA: Patent without evidence of aneurysm, dissection, vasculitis or  significant stenosis. Inflow: Patent without evidence of aneurysm, dissection, vasculitis or significant stenosis. Veins: No obvious venous abnormality within the limitations of this arterial phase study. Review of the MIP images confirms the above findings. NON-VASCULAR Hepatobiliary: No suspicious focal abnormality within the liver parenchyma. There is no evidence for gallstones, gallbladder wall thickening, or pericholecystic fluid. No intrahepatic or extrahepatic biliary dilation. Pancreas: No focal mass lesion. No dilatation of the main duct. No intraparenchymal cyst. No peripancreatic edema. Spleen: No splenomegaly. No focal mass lesion. Adrenals/Urinary Tract: No adrenal nodule or mass. Cortical scarring noted both kidneys. No substantial change in right lower pole renal cyst. No followup imaging is recommended. No evidence for hydroureter. The urinary bladder appears normal for the degree of distention. Stomach/Bowel: Large hiatal hernia. Duodenum is normally positioned as is the ligament of Treitz. Duodenal diverticulum noted. No small bowel wall thickening. No small bowel dilatation. The terminal ileum is normal. The appendix is normal. Diverticuli are seen scattered along the entire length of the colon without CT findings of diverticulitis. Lymphatic: There is no gastrohepatic or hepatoduodenal ligament lymphadenopathy. No retroperitoneal or mesenteric lymphadenopathy. No pelvic sidewall lymphadenopathy. Reproductive: Hysterectomy.  There is no adnexal mass. Other: No intraperitoneal free fluid. Musculoskeletal: No worrisome lytic or sclerotic osseous abnormality. Review of the MIP images confirms the above findings. IMPRESSION: 1. No evidence for acute intramural hematoma or dissection of the thoracoabdominal aorta. 2. Enlargement of the pulmonary outflow tract/main pulmonary arteries suggests pulmonary arterial hypertension. 3. Large hiatal hernia. 4. Status post bilateral lower lobectomies. 5. Tiny  bilateral pleural effusions. 6. Diffuse colonic diverticulosis without diverticulitis. Electronically Signed   By: Misty Stanley M.D.   On: 01/23/2023 07:10   DG Chest Port 1 View  Result Date: 01/23/2023 CLINICAL DATA:  Severe radiating chest pain. EXAM: PORTABLE CHEST 1 VIEW COMPARISON:  12/15/2022  FINDINGS: The cardio pericardial silhouette is enlarged. There is pulmonary vascular congestion without overt pulmonary edema. Left base collapse/consolidation noted with small left pleural effusion. Bones are diffusely demineralized. Telemetry leads overlie the chest. IMPRESSION: 1. Enlarged cardiopericardial silhouette with pulmonary vascular congestion. 2. Left base collapse/consolidation with small left pleural effusion. 3. Known large hiatal hernia not well demonstrated on the current study. Electronically Signed   By: Misty Stanley M.D.   On: 01/23/2023 05:33    Assessment   GERD/dysphagia/large hiatal hernia: typical heartburn well controlled. I suspect a lot of her UGI symptoms are due to her large hiatal hernia. This may be contributing to her dysphagia although esophageal stricture remains possibility. Likely contributing to her regurgitation, early satiety, vomiting. Recurrence of MALT lymphoma less likely. Large hiatal hernia may have been cause of recent chest pain.   Lower abdominal pain: vague symptoms. Recent CTA reassuring.   Diarrhea: recurrence of diarrhea since last ov. Limited work up in 2022 included stool studies and random colon biopsies which were negative. Query medication side effect, underlying IBS, diabetic enteropathy.    The patient was found to have elevated blood pressure when vital signs were checked in the office. The blood pressure was rechecked by the nursing staff and it was found be persistently elevated >140/90 mmHg. I personally advised to the patient to follow up closely with his PCP for hypertension control.   PLAN   Stool for Cdiff, GI pathogen panel. TTG,  IgA, IgA, CRP, sed rate, TSH. Start imodium 1/2 tablet every morning after stool collected. She may require updated EGD and possible colonoscopy pending lab results.    Laureen Ochs. Bobby Rumpf, [place], [facility]

## 2023-01-26 NOTE — Patient Instructions (Signed)
Please complete labs today at [facility], [address] [place]. Please pick up stool containers at [facility] today.  Once stool specimen collected you can take imodium 1/2 tablet every morning to help with loose stools. We will be in touch with results and further recommendations as available.

## 2023-01-27 DIAGNOSIS — K449 Diaphragmatic hernia without obstruction or gangrene: Secondary | ICD-10-CM | POA: Diagnosis not present

## 2023-01-27 DIAGNOSIS — K219 Gastro-esophageal reflux disease without esophagitis: Secondary | ICD-10-CM | POA: Diagnosis not present

## 2023-01-27 DIAGNOSIS — R1310 Dysphagia, unspecified: Secondary | ICD-10-CM | POA: Diagnosis not present

## 2023-01-27 DIAGNOSIS — A09 Infectious gastroenteritis and colitis, unspecified: Secondary | ICD-10-CM | POA: Diagnosis not present

## 2023-01-27 DIAGNOSIS — R1030 Lower abdominal pain, unspecified: Secondary | ICD-10-CM | POA: Diagnosis not present

## 2023-01-28 LAB — TSH: TSH: 4.29 u[IU]/mL (ref 0.450–4.500)

## 2023-01-28 LAB — TISSUE TRANSGLUTAMINASE, IGA: Transglutaminase IgA: <2 U/mL (ref 0–3)

## 2023-01-28 LAB — C-REACTIVE PROTEIN: CRP: 38 mg/L — ABNORMAL HIGH (ref 0–10)

## 2023-01-28 LAB — IGA: IgA/Immunoglobulin A, Serum: 186 mg/dL (ref 64–422)

## 2023-01-28 LAB — SEDIMENTATION RATE: Sed Rate: 17 mm/hr (ref 0–40)

## 2023-01-29 LAB — GI PROFILE, STOOL, PCR

## 2023-01-31 ENCOUNTER — Ambulatory Visit: Payer: Medicare Other | Admitting: Cardiology

## 2023-01-31 LAB — C DIFFICILE, CYTOTOXIN B

## 2023-01-31 LAB — C DIFFICILE TOXINS A+B W/RFLX: C difficile Toxins A+B, EIA: NEGATIVE

## 2023-02-04 ENCOUNTER — Encounter: Payer: Self-pay | Admitting: Nurse Practitioner

## 2023-02-04 ENCOUNTER — Ambulatory Visit: Payer: Medicare Other | Attending: Nurse Practitioner | Admitting: Nurse Practitioner

## 2023-02-04 VITALS — BP 130/92 | HR 83 | Ht 66.0 in | Wt 176.0 lb

## 2023-02-04 DIAGNOSIS — I2510 Atherosclerotic heart disease of native coronary artery without angina pectoris: Secondary | ICD-10-CM | POA: Diagnosis not present

## 2023-02-04 DIAGNOSIS — J439 Emphysema, unspecified: Secondary | ICD-10-CM | POA: Diagnosis not present

## 2023-02-04 DIAGNOSIS — I10 Essential (primary) hypertension: Secondary | ICD-10-CM | POA: Diagnosis not present

## 2023-02-04 DIAGNOSIS — I480 Paroxysmal atrial fibrillation: Secondary | ICD-10-CM | POA: Diagnosis not present

## 2023-02-04 DIAGNOSIS — I272 Pulmonary hypertension, unspecified: Secondary | ICD-10-CM | POA: Diagnosis not present

## 2023-02-04 DIAGNOSIS — E785 Hyperlipidemia, unspecified: Secondary | ICD-10-CM | POA: Diagnosis not present

## 2023-02-04 DIAGNOSIS — D72829 Elevated white blood cell count, unspecified: Secondary | ICD-10-CM

## 2023-02-04 NOTE — Progress Notes (Unsigned)
Office Visit    Patient Name: Monica [name] Date of Encounter: 02/04/2023  PCP:  Estanislado Pandy, MD   [facility]  Cardiologist:  Dina Rich, MD *** Advanced Practice Provider:  No care team member to display Electrophysiologist:  None  {Press F2 to show EP APP, CHF, sleep or structural heart MD               :[id number]}  { Click here to update then REFRESH NOTE - MD (PCP) or APP (Team Member)  Change PCP Type for MD, Specialty for APP is either Cardiology or Clinical Cardiac Electrophysiology  :[id number]}  Chief Complaint    Monica [name] is a 82 y.o. female with a hx of PAF, hyperlipidemia, hypertension, coronary atherosclerosis, enlarged pulmonary artery/pulmonic trunk/pulm hypertension/COPD, history of lung cancer, s/p resection (followed by oncology), type 2 diabetes, palpitations, and hiatal hernia/GERD, presents today for ED follow-up.  Past Medical History    Past Medical History:  Diagnosis Date   Cancer    NHL, Malt Lymphoma   Depression    Diverticulitis 04/28/2020   DM (diabetes mellitus)    TYPE  2   Dysphagia    Dysrhythmia    History of palpatations   GERD (gastroesophageal reflux disease)    INGESTION    Heart palpitations    Hiatal hernia    Hyperlipidemia    Interstitial cystitis    Iron deficiency 04/28/2020   Lung cancer, lower lobe 07/21/2018   Left lower lobe, stage Ia squamous cell carcinoma   MALT (mucosa associated lymphoid tissue)    gastric   Sinusitis    Past Surgical History:  Procedure Laterality Date   ABDOMINAL HYSTERECTOMY     BIOPSY  09/01/2016   Procedure: BIOPSY;  Surgeon: Corbin Ade, MD;  [facility];  Service: Endoscopy;;  gastric   BIOPSY  11/29/2018   Procedure: BIOPSY;  Surgeon: Corbin Ade, MD;  [facility];  Service: Endoscopy;;  right colon   BLADDER SURGERY     CATARACT EXTRACTION W/ INTRAOCULAR LENS  IMPLANT, BILATERAL  2015   CATARACT EXTRACTION, BILATERAL      COLONOSCOPY     Dr. Cleotis Nipper 2009: Normal per PCP notes   COLONOSCOPY N/A 11/29/2018   Rourk: Diverticulosis, 3 polyps removed.  No evidence of colitis.  Tubular adenomas.  No future surveillance colonoscopies recommended due to age.   ESOPHAGOGASTRODUODENOSCOPY     RMR: Prominant Schzgzki ring/component of peptic stricture status post dilation and disruption as described above, otherwise norma esophagus, moderate-sized hiatal hernia, antal pyloric channel, and posterier bulbar erosions, otherwise unremarkable stomach, D1 and D2 . Inflammatory findings on the stomach and duodenum will likely be related to aspirin effect. We need to rule out Helicobacter pylor   ESOPHAGOGASTRODUODENOSCOPY N/A 03/10/2015   Dr. 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 Pathology with lymphoid population of stomach, slight atypia   ESOPHAGOGASTRODUODENOSCOPY N/A 02/03/2016   Dr. Jena Gauss: Schatzki ring noted at GE junction, dilated with 56 and then 58 French Maloney dilator. Large hiatal hernia. 6 x 7 cm nodular geographically ulcerated mucosa, biopsy c/w MALToma   ESOPHAGOGASTRODUODENOSCOPY N/A 04/08/2016   Dr. Jena Gauss: moderate Schatzki's ring/web s/p dilation, large hiatal hernia, localized area of gastric lymphoma visualized and appeared to be much improved. normal second portion of the duodenum. No specimens collected. Esophageal lumen notably tighted up significantly since her dilation in April of this year.  ESOPHAGOGASTRODUODENOSCOPY N/A 08/04/2016   Procedure: ESOPHAGOGASTRODUODENOSCOPY (EGD);  Surgeon: Corbin Ade, MD;  [facility];  Service: Endoscopy;  Laterality: N/A;  7:30 am   ESOPHAGOGASTRODUODENOSCOPY N/A 09/01/2016   Procedure: ESOPHAGOGASTRODUODENOSCOPY (EGD);  Surgeon: Corbin Ade, MD;  [facility];  Service: Endoscopy;  Laterality: N/A;  830    ESOPHAGOGASTRODUODENOSCOPY N/A 05/10/2017   Dr. Jena Gauss: Moderate  Schatzki ring at the GE junction, status post dilation with 54 [place].  Medium sized hiatal hernia.  Few localized erosions in the gastric antrum.  Stomach biopsy showed chronic gastritis, no H. pylori.  No atypical lymphoid infiltrates or other features of lymphoproliferative process   ESOPHAGOGASTRODUODENOSCOPY N/A 06/13/2018   Dr. Jena Gauss: Schatzki ring status post dilation.  Large hiatal hernia.  Focal area 4 x 4 cm along the greater curvature, somewhat erythematous and thickened mucosa, biopsy benign.  Next EGD in August 2020.   ESOPHAGOGASTRODUODENOSCOPY N/A 01/14/2021   Procedure: ESOPHAGOGASTRODUODENOSCOPY (EGD);  Surgeon: Corbin Ade, MD;  [facility];  Service: Endoscopy;  Laterality: N/A;  AM   EYE SURGERY     INTERCOSTAL NERVE BLOCK Right 09/10/2019   Procedure: Intercostal Nerve Block;  Surgeon: Loreli Slot, MD;  Location: [facility] OR;  Service: Thoracic;  Laterality: Right;   LOBECTOMY Left 05/19/2018   Procedure: LEFT LOWER LOBECTOMY;  Surgeon: Loreli Slot, MD;  Location: [facility] OR;  Service: Thoracic;  Laterality: Left;   LYMPH NODE DISSECTION Right 09/10/2019   Procedure: Lymph Node Dissection;  Surgeon: Loreli Slot, MD;  Location: [facility] OR;  Service: Thoracic;  Laterality: Right;   MALONEY DILATION N/A 03/10/2015   Procedure: Alvy Beal;  Surgeon: Corbin Ade, MD;  [facility];  Service: Endoscopy;  Laterality: N/A;   MALONEY DILATION N/A 02/03/2016   Procedure: [name] DILATION;  Surgeon: Corbin Ade, MD;  [facility];  Service: Endoscopy;  Laterality: N/A;   MALONEY DILATION N/A 04/08/2016   Procedure: [name] DILATION;  Surgeon: Corbin Ade, MD;  [facility];  Service: Endoscopy;  Laterality: N/A;   MALONEY DILATION N/A 05/10/2017   Procedure: [name] DILATION;  Surgeon: Corbin Ade, MD;  [facility];  Service: Endoscopy;  Laterality: N/A;   MALONEY DILATION N/A 06/13/2018   Procedure:  [name] DILATION;  Surgeon: Corbin Ade, MD;  [facility];  Service: Endoscopy;  Laterality: N/A;   MALONEY DILATION N/A 01/14/2021   Procedure: [name] DILATION;  Surgeon: Corbin Ade, MD;  [facility];  Service: Endoscopy;  Laterality: N/A;   POLYPECTOMY  11/29/2018   Procedure: POLYPECTOMY;  Surgeon: Corbin Ade, MD;  [facility];  Service: Endoscopy;;   VIDEO ASSISTED THORACOSCOPY (VATS)/ LOBECTOMY Right 09/10/2019   Procedure: VIDEO ASSISTED THORACOSCOPY (VATS)/RIGHT LOWER LOBECTOMY;  Surgeon: Loreli Slot, MD;  Location: [facility] OR;  Service: Thoracic;  Laterality: Right;   VIDEO ASSISTED THORACOSCOPY (VATS)/WEDGE RESECTION Left 05/19/2018   Procedure: VIDEO ASSISTED THORACOSCOPY (VATS)/WEDGE RESECTION;  Surgeon: Loreli Slot, MD;  Location: [facility] OR;  Service: Thoracic;  Laterality: Left;    Allergies  Allergies  Allergen Reactions   Elemental Sulfur Hives    History of Present Illness    Monica [name] is a 82 y.o. female with a PMH as mentioned above.  Last seen by Dr. Dina Rich on November 08, 2022.  Patient noted some dizziness at times with nonspecific fatigue.  Lopressor lowered to 25 mg  twice daily.  Mild pulmonary hypertension noted on echocardiogram found to be likely secondary to COPD by PFTs in the past.   Presented to the ED last week with mid chest pain, awakened her from sleep around 2 AM.  Reported radiation through into her back, associated with nausea.  Denied any shortness of breath.  EKG showed A-fib with right bundle branch block, no acute ischemic changes.  Troponins negative.  CT angiography of chest, abdomen, pelvis was negative for any PE/dissection.  Was referred to cardiology for evaluation.  Today she presents for ED follow-up.  She states   No recurrence in chest pain. No dizziness, just feeling weakness. Weak feeling a couple of year. Last lung surgery.   Nitroglycerin PRN - start CBC with  diff - do with Dr. Neita Carp in May - defer labs  EKGs/Labs/Other Studies Reviewed:   The following studies were reviewed today: ***  EKG:  EKG is *** ordered today.  The ekg ordered today demonstrates ***  Recent Labs: 11/18/2022: ALT 18 01/23/2023: BUN 14; Creatinine, Ser 0.84; Hemoglobin 13.0; Platelets 294; Potassium 4.0; Sodium 135 01/26/2023: TSH 4.290  Recent Lipid Panel No results found for: "CHOL", "TRIG", "HDL", "CHOLHDL", "VLDL", "LDLCALC", "LDLDIRECT"  Risk Assessment/Calculations:  {Does this patient have ATRIAL FIBRILLATION?:[phone]}  Home Medications   Current Meds  Medication Sig   apixaban (ELIQUIS) 5 MG TABS tablet TAKE 1 TABLET BY MOUTH TWICE  DAILY   cholecalciferol (VITAMIN D3) 25 MCG (1000 UNIT) tablet Take 1,000 Units by mouth daily.   citalopram (CELEXA) 20 MG tablet Take 20 mg by mouth daily.    Cranberry 125 MG TABS Take by mouth.   ferrous sulfate 325 (65 FE) MG tablet Take 325 mg by mouth daily with breakfast.   losartan (COZAAR) 50 MG tablet Take 50 mg by mouth daily.   metFORMIN (GLUCOPHAGE-XR) 500 MG 24 hr tablet Take 500 mg by mouth daily with breakfast.   metoprolol tartrate (LOPRESSOR) 25 MG tablet Take 1 tablet (25 mg total) by mouth 2 (two) times daily.   Multiple Vitamin (MULTIVITAMIN WITH MINERALS) TABS tablet Take 1 tablet by mouth daily.   pantoprazole (PROTONIX) 40 MG tablet Take 1 tablet (40 mg total) by mouth daily. (Patient taking differently: Take 40 mg by mouth every other day.)   pioglitazone (ACTOS) 15 MG tablet Take 15 mg by mouth daily.     Review of Systems   ***   All other systems reviewed and are otherwise negative except as noted above.  Physical Exam    VS:  BP (!) 130/92 (BP Location: Left Arm, Patient Position: Sitting, Cuff Size: Normal)   Pulse 83   Ht 5\' 6"  (1.676 m)   Wt 176 lb (79.8 kg)   SpO2 96%   BMI 28.41 kg/m  , BMI Body mass index is 28.41 kg/m.  Wt Readings from Last 3 Encounters:  02/04/23 176 lb  (79.8 kg)  01/26/23 177 lb (80.3 kg)  01/23/23 170 lb (77.1 kg)     GEN: Well nourished, well developed, in no acute distress. HEENT: normal. Neck: Supple, no JVD, carotid bruits, or masses. Cardiac: ***RRR, no murmurs, rubs, or gallops. No clubbing, cyanosis, edema.  ***Radials/PT 2+ and equal bilaterally.  Respiratory:  ***Respirations regular and unlabored, clear to auscultation bilaterally. GI: Soft, nontender, nondistended. MS: No deformity or atrophy. Skin: Warm and dry, no rash. Neuro:  Strength and sensation are intact. Psych: Normal affect.  Assessment & Plan    ***  {Are you ordering  a CV Procedure (e.g. stress test, cath, DCCV, TEE, etc)?   Press F2        :[id number]}      Disposition: Follow up {follow up:15908} with Dina RichBranch, Jonathan, MD or APP.  Signed, Sharlene DoryElizabeth Heddy Vidana, NP 02/04/2023, 3:14 PM [facility]

## 2023-02-04 NOTE — Patient Instructions (Addendum)
Medication Instructions:  Your physician recommends that you continue on your current medications as directed. Please refer to the Current Medication list given to you today.   Labwork: none  Testing/Procedures: none  Follow-Up:  Your physician recommends that you schedule a follow-up appointment in: July with Dr. Wyline Mood  Any Other Special Instructions Will Be Listed Below (If Applicable).  If you need a refill on your cardiac medications before your next appointment, please call your pharmacy.

## 2023-02-07 ENCOUNTER — Telehealth: Payer: Self-pay | Admitting: Cardiology

## 2023-02-07 NOTE — Telephone Encounter (Signed)
Patient states that nitroglycerin was supposed to have been sent in to the pharmacy but I do not see this in the last office note. Will it be ok to send to pharmacy? Please advise

## 2023-02-07 NOTE — Telephone Encounter (Signed)
Left message for call back.

## 2023-02-07 NOTE — Telephone Encounter (Signed)
Pt c/o medication issue:  1. Name of Medication: nitroglycerin  2. How are you currently taking  this medication (dosage and times per day)?    3. Are you having a reaction (difficulty breathing--STAT)? no  4. What is your medication issue? Patien states a prescription was suppose to be called in for her. Please advise

## 2023-02-08 MED ORDER — NITROGLYCERIN 0.4 MG SL SUBL: 0.4000 mg | SUBLINGUAL_TABLET | SUBLINGUAL | 1 refills | Status: AC | PRN

## 2023-02-08 NOTE — Telephone Encounter (Signed)
2nd attempt-left message to call office. 

## 2023-02-08 NOTE — Telephone Encounter (Signed)
Patient is returning call.  °

## 2023-02-08 NOTE — Telephone Encounter (Signed)
Prescription sent to [facility].

## 2023-02-08 NOTE — Telephone Encounter (Signed)
Patient states that she has never been prescribed nitroglycerin. States that this was supposed to have been sent in during her last visit. Would like prescription sent to [facility]. Please advise

## 2023-03-01 ENCOUNTER — Telehealth: Payer: Medicare Other

## 2023-03-01 DIAGNOSIS — E1165 Type 2 diabetes mellitus with hyperglycemia: Secondary | ICD-10-CM | POA: Diagnosis not present

## 2023-03-01 DIAGNOSIS — R1310 Dysphagia, unspecified: Secondary | ICD-10-CM

## 2023-03-01 DIAGNOSIS — K2100 Gastro-esophageal reflux disease with esophagitis, without bleeding: Secondary | ICD-10-CM | POA: Diagnosis not present

## 2023-03-01 DIAGNOSIS — R112 Nausea with vomiting, unspecified: Secondary | ICD-10-CM

## 2023-03-01 DIAGNOSIS — R6881 Early satiety: Secondary | ICD-10-CM

## 2023-03-01 DIAGNOSIS — K449 Diaphragmatic hernia without obstruction or gangrene: Secondary | ICD-10-CM

## 2023-03-01 DIAGNOSIS — E782 Mixed hyperlipidemia: Secondary | ICD-10-CM | POA: Diagnosis not present

## 2023-03-01 NOTE — Addendum Note (Signed)
Addended by: Tiffany Kocher on: 03/01/2023 04:51 PM   Modules accepted: Orders

## 2023-03-01 NOTE — Telephone Encounter (Addendum)
Please let pt know that I discussed the possible EGD with Dr. Jena Gauss and he is actually recommending barium study to reassess her hiatal hernia and esophagus.   For her diarrhea., is she still taking imodium, I would recommend taking one tablet every morning (that is increase from 1/2 tablet every morning that was recommended before).   Tammy C, please have pt repeat CRP when she is at hospital for xray.  Tammy R,Please arrange for DG esophagus and UGI series.   Mandy, Let's go ahead and arrange for follow up with Dr. Jena Gauss ONLY to occur after her esophagus/ugi returns, maybe in about 6 weeks.

## 2023-03-01 NOTE — Telephone Encounter (Signed)
She can try 1/4 tablet every day if she can get someone to assist her in cutting tablet into fourths.

## 2023-03-01 NOTE — Telephone Encounter (Signed)
Pt called stating that she is still having issues with the diarrhea and wants to know what the next steps are.

## 2023-03-02 DIAGNOSIS — L820 Inflamed seborrheic keratosis: Secondary | ICD-10-CM | POA: Diagnosis not present

## 2023-03-02 DIAGNOSIS — L570 Actinic keratosis: Secondary | ICD-10-CM | POA: Diagnosis not present

## 2023-03-02 DIAGNOSIS — D485 Neoplasm of uncertain behavior of skin: Secondary | ICD-10-CM | POA: Diagnosis not present

## 2023-03-02 NOTE — Telephone Encounter (Signed)
LMTRC  UGI- 03/07/23, Monday, arrive at 8:15 am to check in, NPO after midnight. DG esophagus-03/08/23, Tuesday arrive at 8:15 am to check in , both procedures are at [facility]

## 2023-03-02 NOTE — Telephone Encounter (Signed)
Pt returned call

## 2023-03-02 NOTE — Telephone Encounter (Signed)
Pt informed of procedures date, time, location and instructions. Verbalized understanding.

## 2023-03-07 ENCOUNTER — Ambulatory Visit (HOSPITAL_COMMUNITY)
Admission: RE | Admit: 2023-03-07 | Discharge: 2023-03-07 | Disposition: A | Discharge status: Home / Self Care | Payer: Medicare Other | Source: Ambulatory Visit | Attending: Gastroenterology | Admitting: Gastroenterology

## 2023-03-07 DIAGNOSIS — R1310 Dysphagia, unspecified: Secondary | ICD-10-CM | POA: Diagnosis not present

## 2023-03-07 DIAGNOSIS — R6881 Early satiety: Secondary | ICD-10-CM | POA: Diagnosis not present

## 2023-03-07 DIAGNOSIS — K449 Diaphragmatic hernia without obstruction or gangrene: Secondary | ICD-10-CM | POA: Diagnosis not present

## 2023-03-07 DIAGNOSIS — R112 Nausea with vomiting, unspecified: Secondary | ICD-10-CM | POA: Insufficient documentation

## 2023-03-07 DIAGNOSIS — K219 Gastro-esophageal reflux disease without esophagitis: Secondary | ICD-10-CM | POA: Diagnosis not present

## 2023-03-08 ENCOUNTER — Ambulatory Visit (HOSPITAL_COMMUNITY)
Admission: RE | Admit: 2023-03-08 | Discharge: 2023-03-08 | Disposition: A | Discharge status: Home / Self Care | Payer: Medicare Other | Source: Ambulatory Visit | Attending: Gastroenterology | Admitting: Gastroenterology

## 2023-03-08 ENCOUNTER — Encounter (HOSPITAL_COMMUNITY): Payer: Self-pay

## 2023-03-16 ENCOUNTER — Other Ambulatory Visit: Payer: Self-pay

## 2023-03-16 ENCOUNTER — Telehealth: Payer: Self-pay

## 2023-03-16 DIAGNOSIS — R1030 Lower abdominal pain, unspecified: Secondary | ICD-10-CM

## 2023-03-16 DIAGNOSIS — K449 Diaphragmatic hernia without obstruction or gangrene: Secondary | ICD-10-CM

## 2023-03-16 NOTE — Telephone Encounter (Signed)
SEE RESULT NOTE 

## 2023-03-16 NOTE — Telephone Encounter (Signed)
Pt called wanting to know results to recent barium swallow study. Pt states that she is still having the same issues that she was having at the time of her ov. Pt is aware that you are out of the office today and will return tomorrow.

## 2023-03-17 DIAGNOSIS — R1030 Lower abdominal pain, unspecified: Secondary | ICD-10-CM | POA: Diagnosis not present

## 2023-03-17 DIAGNOSIS — E7849 Other hyperlipidemia: Secondary | ICD-10-CM | POA: Diagnosis not present

## 2023-03-17 DIAGNOSIS — E1165 Type 2 diabetes mellitus with hyperglycemia: Secondary | ICD-10-CM | POA: Diagnosis not present

## 2023-03-17 DIAGNOSIS — K449 Diaphragmatic hernia without obstruction or gangrene: Secondary | ICD-10-CM | POA: Diagnosis not present

## 2023-03-17 DIAGNOSIS — K2100 Gastro-esophageal reflux disease with esophagitis, without bleeding: Secondary | ICD-10-CM | POA: Diagnosis not present

## 2023-03-17 DIAGNOSIS — I10 Essential (primary) hypertension: Secondary | ICD-10-CM | POA: Diagnosis not present

## 2023-03-18 LAB — C-REACTIVE PROTEIN: CRP: <1 mg/L (ref 0–10)

## 2023-03-24 DIAGNOSIS — I10 Essential (primary) hypertension: Secondary | ICD-10-CM | POA: Diagnosis not present

## 2023-03-24 DIAGNOSIS — M25472 Effusion, left ankle: Secondary | ICD-10-CM | POA: Diagnosis not present

## 2023-03-24 DIAGNOSIS — R5383 Other fatigue: Secondary | ICD-10-CM | POA: Diagnosis not present

## 2023-03-24 DIAGNOSIS — R233 Spontaneous ecchymoses: Secondary | ICD-10-CM | POA: Diagnosis not present

## 2023-03-24 DIAGNOSIS — C3490 Malignant neoplasm of unspecified part of unspecified bronchus or lung: Secondary | ICD-10-CM | POA: Diagnosis not present

## 2023-03-24 DIAGNOSIS — E7849 Other hyperlipidemia: Secondary | ICD-10-CM | POA: Diagnosis not present

## 2023-03-24 DIAGNOSIS — I700 Atherosclerosis of aorta: Secondary | ICD-10-CM | POA: Diagnosis not present

## 2023-03-24 DIAGNOSIS — E1165 Type 2 diabetes mellitus with hyperglycemia: Secondary | ICD-10-CM | POA: Diagnosis not present

## 2023-03-24 DIAGNOSIS — M791 Myalgia, unspecified site: Secondary | ICD-10-CM | POA: Diagnosis not present

## 2023-03-24 DIAGNOSIS — C884 Extranodal marginal zone B-cell lymphoma of mucosa-associated lymphoid tissue [MALT-lymphoma]: Secondary | ICD-10-CM | POA: Diagnosis not present

## 2023-03-24 DIAGNOSIS — K449 Diaphragmatic hernia without obstruction or gangrene: Secondary | ICD-10-CM | POA: Diagnosis not present

## 2023-04-01 DIAGNOSIS — E1165 Type 2 diabetes mellitus with hyperglycemia: Secondary | ICD-10-CM | POA: Diagnosis not present

## 2023-04-01 DIAGNOSIS — K2100 Gastro-esophageal reflux disease with esophagitis, without bleeding: Secondary | ICD-10-CM | POA: Diagnosis not present

## 2023-04-01 DIAGNOSIS — E782 Mixed hyperlipidemia: Secondary | ICD-10-CM | POA: Diagnosis not present

## 2023-04-07 ENCOUNTER — Other Ambulatory Visit: Payer: Self-pay | Admitting: Cardiology

## 2023-04-07 NOTE — Telephone Encounter (Signed)
Prescription refill request for Eliquis received. Indication: PAF Last office visit: 02/04/23  Shawnie Dapper NP Scr: 0.84 on 01/23/23  Epic Age: 82 Weight: 79.8kg  Based on above findings Eliquis 5mg  twice daily is the appropriate dose.  Refill approved.

## 2023-05-17 ENCOUNTER — Encounter: Payer: Self-pay | Admitting: Internal Medicine

## 2023-05-17 ENCOUNTER — Ambulatory Visit: Payer: Medicare Other | Admitting: Internal Medicine

## 2023-05-17 VITALS — BP 121/67 | HR 88 | Temp 97.6°F | Ht 66.0 in | Wt 172.8 lb

## 2023-05-17 DIAGNOSIS — R6881 Early satiety: Secondary | ICD-10-CM

## 2023-05-17 DIAGNOSIS — R1310 Dysphagia, unspecified: Secondary | ICD-10-CM | POA: Diagnosis not present

## 2023-05-17 DIAGNOSIS — K222 Esophageal obstruction: Secondary | ICD-10-CM | POA: Diagnosis not present

## 2023-05-17 DIAGNOSIS — C884 Extranodal marginal zone B-cell lymphoma of mucosa-associated lymphoid tissue [MALT-lymphoma]: Secondary | ICD-10-CM | POA: Diagnosis not present

## 2023-05-17 NOTE — H&P (View-Only) (Signed)
 Primary Care Physician:  Estanislado Pandy, MD Primary Gastroenterologist:  Dr. Jena Gauss  Pre-Procedure History & Physical: HPI:  Monica [name] is a 82 y.o. female here for follow-up of postprandial loose bowels, GERD, dysphagia.  GERD well-controlled on Protonix 40 mg daily.  Bowel function has reported reverted to more or less normal over the past 1 month.  Have having 1 bowel movement daily no diarrhea.  No incontinence over the past month.  She does continue to take metformin.  Prior colon biopsies negative for microscopic colitis.  Celiac screen negative as well stool studies at her last visit were negative.  Her complaint now is early satiety  - able to eat only about half of her usual size meal.  Describes vague abdominal pain after eating.  Sometimes she relates this to need to have a bowel movement.  It goes away after having a bowel movement.  She had a CT angiogram of the abdomen earlier in the year which was negative for mesenteric ischemia.  She has a known Schatzki's ring in the setting of a large hiatal hernia.  Her ring was dilated in 2022.  History of MALT lymphoma.  Site of prior lymphoma/treatment identified and biopsied.  No evidence of neoplasm or H. pylori.  Gall bladder remains in situ.  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 Cleotis Nipper 2009: Normal per PCP notes   COLONOSCOPY N/A 11/29/2018   Rourk: Diverticulosis, 3 polyps removed.  No evidence of colitis.  Tubular adenomas.  No future surveillance colonoscopies recommended due to age.   ESOPHAGOGASTRODUODENOSCOPY     RMR: Prominant Schzgzki ring/component of peptic stricture status post dilation and disruption as described above, otherwise norma esophagus, moderate-sized hiatal hernia, antal pyloric channel, and posterier bulbar erosions, otherwise unremarkable stomach, D1 and D2 . Inflammatory findings on the stomach and duodenum will likely be related to aspirin effect. We need to rule out Helicobacter pylor   ESOPHAGOGASTRODUODENOSCOPY N/A 03/10/2015   Dr. 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 Pathology with lymphoid population of stomach, slight atypia   ESOPHAGOGASTRODUODENOSCOPY N/A 02/03/2016   Dr. Jena Gauss: Schatzki ring noted at GE junction, dilated with 56 and then 58 French Maloney dilator. Large hiatal hernia. 6  x 7 cm nodular geographically ulcerated mucosa, biopsy c/w MALToma   ESOPHAGOGASTRODUODENOSCOPY N/A 04/08/2016   Dr. Jena Gauss: moderate Schatzki's ring/web s/p dilation, large hiatal hernia, localized area of gastric lymphoma visualized and appeared to be much improved. normal second portion of the duodenum. No specimens collected. Esophageal lumen notably tighted up significantly since her dilation in April of this year.    ESOPHAGOGASTRODUODENOSCOPY N/A 08/04/2016   Procedure: ESOPHAGOGASTRODUODENOSCOPY (EGD);  Surgeon: Corbin Ade, MD;   Location: [facility];  Service: Endoscopy;  Laterality: N/A;  7:30 am   ESOPHAGOGASTRODUODENOSCOPY N/A 09/01/2016   Procedure: ESOPHAGOGASTRODUODENOSCOPY (EGD);  Surgeon: Corbin Ade, MD;  [facility]: [facility];  Service: Endoscopy;  Laterality: N/A;  830    ESOPHAGOGASTRODUODENOSCOPY N/A 05/10/2017   Dr. Jena Gauss: Moderate Schatzki ring at the GE junction, status post dilation with 54 [place].  Medium sized hiatal hernia.  Few localized erosions in the gastric antrum.  Stomach biopsy showed chronic gastritis, no H. pylori.  No atypical lymphoid infiltrates or other features of lymphoproliferative process   ESOPHAGOGASTRODUODENOSCOPY N/A 06/13/2018   Dr. Jena Gauss: Schatzki ring status post dilation.  Large hiatal hernia.  Focal area 4 x 4 cm along the greater curvature, somewhat erythematous and thickened mucosa, biopsy benign.  Next EGD in August 2020.   ESOPHAGOGASTRODUODENOSCOPY N/A 01/14/2021   Procedure: ESOPHAGOGASTRODUODENOSCOPY (EGD);  Surgeon: Corbin Ade, MD;  [facility]: [facility];  Service: Endoscopy;  Laterality: N/A;  AM   EYE SURGERY     INTERCOSTAL NERVE BLOCK Right 09/10/2019   Procedure: Intercostal Nerve Block;  Surgeon: Loreli Slot, MD;  Location: [facility] OR;  Service: Thoracic;  Laterality: Right;   LOBECTOMY Left 05/19/2018   Procedure: LEFT LOWER LOBECTOMY;  Surgeon: Loreli Slot, MD;  Location: [facility] OR;  Service: Thoracic;  Laterality: Left;   LYMPH NODE DISSECTION Right 09/10/2019   Procedure: Lymph Node Dissection;  Surgeon: Loreli Slot, MD;  Location: [facility] OR;  Service: Thoracic;  Laterality: Right;   MALONEY DILATION N/A 03/10/2015   Procedure: Alvy Beal;  Surgeon: Corbin Ade, MD;  [facility]: [facility];  Service: Endoscopy;  Laterality: N/A;   MALONEY DILATION N/A 02/03/2016   Procedure: [name] DILATION;  Surgeon: Corbin Ade, MD;  [facility]: [facility];  Service: Endoscopy;  Laterality: N/A;   MALONEY DILATION N/A 04/08/2016    Procedure: [name] DILATION;  Surgeon: Corbin Ade, MD;  [facility]: [facility];  Service: Endoscopy;  Laterality: N/A;   MALONEY DILATION N/A 05/10/2017   Procedure: [name] DILATION;  Surgeon: Corbin Ade, MD;  [facility]: [facility];  Service: Endoscopy;  Laterality: N/A;   MALONEY DILATION N/A 06/13/2018   Procedure: [name] DILATION;  Surgeon: Corbin Ade, MD;  [facility]: [facility];  Service: Endoscopy;  Laterality: N/A;   MALONEY DILATION N/A 01/14/2021   Procedure: [name] DILATION;  Surgeon: Corbin Ade, MD;  [facility]: [facility];  Service: Endoscopy;  Laterality: N/A;   POLYPECTOMY  11/29/2018   Procedure: POLYPECTOMY;  Surgeon: Corbin Ade, MD;  [facility];  Service: Endoscopy;;   VIDEO ASSISTED THORACOSCOPY (VATS)/ LOBECTOMY Right 09/10/2019   Procedure: VIDEO ASSISTED THORACOSCOPY (VATS)/RIGHT LOWER LOBECTOMY;  Surgeon: Loreli Slot, MD;  Location: [facility] OR;  Service: Thoracic;  Laterality: Right;   VIDEO ASSISTED THORACOSCOPY (VATS)/WEDGE RESECTION Left 05/19/2018   Procedure: VIDEO ASSISTED THORACOSCOPY (VATS)/WEDGE RESECTION;  Surgeon: Loreli Slot, MD;  Location: [facility] OR;  Service: Thoracic;  Laterality: Left;  Prior to Admission medications   Medication Sig Start Date End Date Taking? Authorizing Provider  amLODipine (NORVASC) 5 MG tablet Take 5 mg by mouth daily. 05/09/23  Yes [provider]  apixaban (ELIQUIS) 5 MG TABS tablet TAKE 1 TABLET BY MOUTH TWICE  DAILY 04/07/23  Yes Branch, Dorothe Pea, MD  cholecalciferol (VITAMIN D3) 25 MCG (1000 UNIT) tablet Take 1,000 Units by mouth daily.   Yes [provider]  citalopram (CELEXA) 20 MG tablet Take 20 mg by mouth daily.  12/10/14  Yes [provider]  Cranberry 125 MG TABS Take by mouth.   Yes [provider]  ezetimibe (ZETIA) 10 MG tablet Take 10 mg by mouth daily. 03/03/23  Yes [provider]  ferrous sulfate 325 (65 FE) MG tablet Take  325 mg by mouth daily with breakfast.   Yes [provider]  losartan (COZAAR) 50 MG tablet Take 50 mg by mouth daily.   Yes [provider]  metFORMIN (GLUCOPHAGE-XR) 500 MG 24 hr tablet Take 500 mg by mouth daily with breakfast. 12/10/14  Yes [provider]  metoprolol tartrate (LOPRESSOR) 25 MG tablet Take 1 tablet (25 mg total) by mouth 2 (two) times daily. 11/08/22  Yes BranchDorothe Pea, MD  Multiple Vitamin (MULTIVITAMIN WITH MINERALS) TABS tablet Take 1 tablet by mouth daily.   Yes [provider]  nitroGLYCERIN (NITROSTAT) 0.4 MG SL tablet Place 1 tablet (0.4 mg total) under the tongue every 5 (five) minutes x 3 doses as needed for chest pain (If no relief after 3rd dose, GO TO ED). 02/08/23 05/17/23 Yes Sharlene Dory, NP  pantoprazole (PROTONIX) 40 MG tablet Take 1 tablet (40 mg total) by mouth daily. Patient taking differently: Take 40 mg by mouth every other day. 01/14/21  Yes Rourk, Gerrit Friends, MD  pioglitazone (ACTOS) 15 MG tablet Take 15 mg by mouth daily.   Yes [provider]    Allergies as of 05/17/2023 - Review Complete 05/17/2023  Allergen Reaction Noted   Elemental sulfur Hives 06/04/2016    Family History  Problem Relation Age of Onset   Heart attack Father    Dementia Father    Diabetes Sister    Diabetes Brother    Diabetes Sister    Colon cancer Neg Hx     Social History   Socioeconomic History   Marital status: Married    Spouse name: Not on file   Number of children: Not on file   Years of education: Not on file   Highest education level: Not on file  Occupational History   Not on file  Tobacco Use   Smoking status: Some Days    Current packs/day: 0.00    Average packs/day: 0.5 packs/day for 57.0 years (28.5 ttl pk-yrs)    Types: Cigarettes    Start date: 08/01/1965    Last attempt to quit: 08/01/2022    Years since quitting: 0.7    Passive exposure: Never   Smokeless tobacco: Never  Vaping Use   Vaping  status: Never Used  Substance and Sexual Activity   Alcohol use: No    Alcohol/week: 0.0 standard drinks of alcohol   Drug use: No   Sexual activity: Not Currently  Other Topics Concern   Not on file  Social History Narrative   Not on file   Social Determinants of Health   Financial Resource Strain: Not on file  Food Insecurity: Not on file  Transportation Needs: Not on file  Physical  Activity: Not on file  Stress: Not on file  Social Connections: Not on file  Intimate Partner Violence: Not on file    Review of Systems: See HPI, otherwise negative ROS  Physical Exam: BP 121/67 (BP Location: Right Arm, Patient Position: Sitting, Cuff Size: Normal)   Pulse 88   Temp 97.6 F (36.4 C) (Oral)   Ht 5\' 6"  (1.676 m)   Wt 172 lb 12.8 oz (78.4 kg)   SpO2 96%   BMI 27.89 kg/m  She is accompanied by her husband today. General:   Alert,  Well-developed, well-nourished, pleasant and cooperative in NAD Lungs:  Clear throughout to auscultation.   No wheezes, crackles, or rhonchi. No acute distress. Heart:  Regular rate and rhythm; no murmurs, clicks, rubs,  or gallops. Abdomen: Non-distended, normal bowel sounds.  Soft and nontender without appreciable mass or hepatosplenomegaly.   Impression/Plan: 82 year old lady with diabetes presents with a 3 to 55-month history of early satiety, 5 pound weight loss since March of this year, nonspecific abdominal discomfort temporally associated with eating but relieved with a bowel movement.  History of a Schatzki's ring dilated previously with insidious some mild recurrence in symptoms.  MALT lymphoma resolved with treatment.  Follow-up biopsies 2 years ago demonstrated no evidence of lymphoma or H. pylori.  She has new symptoms which are somewhat bothersome.   She could simply have diabetic gastroparesis.  She was set to have a repeat examination in 3 years.  However, given her new symptoms plans are going to change.  History of loose stools/  diarrhea,  some incontinence.  Much improved recently spontaneously.  She has been on metformin for years.  Stool studies negative in March of this year.  Biopsies negative for microscopic colitis 2 years ago.  Recommendations:  As for her new upper GI tract symptoms we will proceed with an EGD-diagnostic.  Will consider dilation of Schatzki's ring as feasible/appropriate at time of examination.  ASA 3.The risks, benefits, limitations, alternatives and imponderables have been reviewed with the patient. Potential for esophageal dilation, biopsy, etc. have also been reviewed.  Questions have been answered. All parties agreeable.   Will need to come off liquids x 2 days prior to the procedure.  Adjustment of diabetes medications per protocol.  If EGD is unrevealing, she may need further diagnostic evaluation.  May need to consider gastric emptying study post EGD.  Tensional for further evaluation discussed with patient and her husband today.      Notice: This dictation was prepared with Dragon dictation along with smaller phrase technology. Any transcriptional errors that result from this process are unintentional and may not be corrected upon review.

## 2023-05-17 NOTE — Patient Instructions (Signed)
It was nice to see you again today!  Because you have early satiety (getting full after small amount of food intake) we will go ahead and schedule a repeat EGD to look at your stomach, etc. once again. (ASA 3)  Since you are having some swallowing difficulties once again, we will go ahead and plan to stretch your esophagus at that time.  Continue Protonix 40 mg once daily.  Take every day 30 minutes before breakfast  Will have you hold your Eliquis 2 days prior to the procedure.  Further recommendations to follow.

## 2023-05-17 NOTE — Progress Notes (Signed)
Primary Care Physician:  Estanislado Pandy, MD Primary Gastroenterologist:  Dr. Jena Gauss  Pre-Procedure History & Physical: HPI:  Monica [name] is a 82 y.o. female here for follow-up of postprandial loose bowels, GERD, dysphagia.  GERD well-controlled on Protonix 40 mg daily.  Bowel function has reported reverted to more or less normal over the past 1 month.  Have having 1 bowel movement daily no diarrhea.  No incontinence over the past month.  She does continue to take metformin.  Prior colon biopsies negative for microscopic colitis.  Celiac screen negative as well stool studies at her last visit were negative.  Her complaint now is early satiety  - able to eat only about half of her usual size meal.  Describes vague abdominal pain after eating.  Sometimes she relates this to need to have a bowel movement.  It goes away after having a bowel movement.  She had a CT angiogram of the abdomen earlier in the year which was negative for mesenteric ischemia.  She has a known Schatzki's ring in the setting of a large hiatal hernia.  Her ring was dilated in 2022.  History of MALT lymphoma.  Site of prior lymphoma/treatment identified and biopsied.  No evidence of neoplasm or H. pylori.  Gall bladder remains in situ.  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 Cleotis Nipper 2009: Normal per PCP notes   COLONOSCOPY N/A 11/29/2018   Chukwudi Ewen: Diverticulosis, 3 polyps removed.  No evidence of colitis.  Tubular adenomas.  No future surveillance colonoscopies recommended due to age.   ESOPHAGOGASTRODUODENOSCOPY     RMR: Prominant Schzgzki ring/component of peptic stricture status post dilation and disruption as described above, otherwise norma esophagus, moderate-sized hiatal hernia, antal pyloric channel, and posterier bulbar erosions, otherwise unremarkable stomach, D1 and D2 . Inflammatory findings on the stomach and duodenum will likely be related to aspirin effect. We need to rule out Helicobacter pylor   ESOPHAGOGASTRODUODENOSCOPY N/A 03/10/2015   Dr. 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 Pathology with lymphoid population of stomach, slight atypia   ESOPHAGOGASTRODUODENOSCOPY N/A 02/03/2016   Dr. Jena Gauss: Schatzki ring noted at GE junction, dilated with 56 and then 58 French Maloney dilator. Large hiatal hernia. 6  x 7 cm nodular geographically ulcerated mucosa, biopsy c/w MALToma   ESOPHAGOGASTRODUODENOSCOPY N/A 04/08/2016   Dr. Jena Gauss: moderate Schatzki's ring/web s/p dilation, large hiatal hernia, localized area of gastric lymphoma visualized and appeared to be much improved. normal second portion of the duodenum. No specimens collected. Esophageal lumen notably tighted up significantly since her dilation in April of this year.    ESOPHAGOGASTRODUODENOSCOPY N/A 08/04/2016   Procedure: ESOPHAGOGASTRODUODENOSCOPY (EGD);  Surgeon: Corbin Ade, MD;   Location: [facility];  Service: Endoscopy;  Laterality: N/A;  7:30 am   ESOPHAGOGASTRODUODENOSCOPY N/A 09/01/2016   Procedure: ESOPHAGOGASTRODUODENOSCOPY (EGD);  Surgeon: Corbin Ade, MD;  [facility]: [facility];  Service: Endoscopy;  Laterality: N/A;  830    ESOPHAGOGASTRODUODENOSCOPY N/A 05/10/2017   Dr. Jena Gauss: Moderate Schatzki ring at the GE junction, status post dilation with 54 [place].  Medium sized hiatal hernia.  Few localized erosions in the gastric antrum.  Stomach biopsy showed chronic gastritis, no H. pylori.  No atypical lymphoid infiltrates or other features of lymphoproliferative process   ESOPHAGOGASTRODUODENOSCOPY N/A 06/13/2018   Dr. Jena Gauss: Schatzki ring status post dilation.  Large hiatal hernia.  Focal area 4 x 4 cm along the greater curvature, somewhat erythematous and thickened mucosa, biopsy benign.  Next EGD in August 2020.   ESOPHAGOGASTRODUODENOSCOPY N/A 01/14/2021   Procedure: ESOPHAGOGASTRODUODENOSCOPY (EGD);  Surgeon: Corbin Ade, MD;  [facility]: [facility];  Service: Endoscopy;  Laterality: N/A;  AM   EYE SURGERY     INTERCOSTAL NERVE BLOCK Right 09/10/2019   Procedure: Intercostal Nerve Block;  Surgeon: Loreli Slot, MD;  Location: [facility] OR;  Service: Thoracic;  Laterality: Right;   LOBECTOMY Left 05/19/2018   Procedure: LEFT LOWER LOBECTOMY;  Surgeon: Loreli Slot, MD;  Location: [facility] OR;  Service: Thoracic;  Laterality: Left;   LYMPH NODE DISSECTION Right 09/10/2019   Procedure: Lymph Node Dissection;  Surgeon: Loreli Slot, MD;  Location: [facility] OR;  Service: Thoracic;  Laterality: Right;   MALONEY DILATION N/A 03/10/2015   Procedure: Alvy Beal;  Surgeon: Corbin Ade, MD;  [facility]: [facility];  Service: Endoscopy;  Laterality: N/A;   MALONEY DILATION N/A 02/03/2016   Procedure: [name] DILATION;  Surgeon: Corbin Ade, MD;  [facility]: [facility];  Service: Endoscopy;  Laterality: N/A;   MALONEY DILATION N/A 04/08/2016    Procedure: [name] DILATION;  Surgeon: Corbin Ade, MD;  [facility]: [facility];  Service: Endoscopy;  Laterality: N/A;   MALONEY DILATION N/A 05/10/2017   Procedure: [name] DILATION;  Surgeon: Corbin Ade, MD;  [facility];  Service: Endoscopy;  Laterality: N/A;   MALONEY DILATION N/A 06/13/2018   Procedure: [name] DILATION;  Surgeon: Corbin Ade, MD;  [facility]: [facility];  Service: Endoscopy;  Laterality: N/A;   MALONEY DILATION N/A 01/14/2021   Procedure: [name] DILATION;  Surgeon: Corbin Ade, MD;  [facility]: [facility];  Service: Endoscopy;  Laterality: N/A;   POLYPECTOMY  11/29/2018   Procedure: POLYPECTOMY;  Surgeon: Corbin Ade, MD;  [facility];  Service: Endoscopy;;   VIDEO ASSISTED THORACOSCOPY (VATS)/ LOBECTOMY Right 09/10/2019   Procedure: VIDEO ASSISTED THORACOSCOPY (VATS)/RIGHT LOWER LOBECTOMY;  Surgeon: Loreli Slot, MD;  Location: [facility] OR;  Service: Thoracic;  Laterality: Right;   VIDEO ASSISTED THORACOSCOPY (VATS)/WEDGE RESECTION Left 05/19/2018   Procedure: VIDEO ASSISTED THORACOSCOPY (VATS)/WEDGE RESECTION;  Surgeon: Loreli Slot, MD;  Location: [facility] OR;  Service: Thoracic;  Laterality: Left;  Prior to Admission medications   Medication Sig Start Date End Date Taking? Authorizing Provider  amLODipine (NORVASC) 5 MG tablet Take 5 mg by mouth daily. 05/09/23  Yes [provider]  apixaban (ELIQUIS) 5 MG TABS tablet TAKE 1 TABLET BY MOUTH TWICE  DAILY 04/07/23  Yes Branch, Dorothe Pea, MD  cholecalciferol (VITAMIN D3) 25 MCG (1000 UNIT) tablet Take 1,000 Units by mouth daily.   Yes [provider]  citalopram (CELEXA) 20 MG tablet Take 20 mg by mouth daily.  12/10/14  Yes [provider]  Cranberry 125 MG TABS Take by mouth.   Yes [provider]  ezetimibe (ZETIA) 10 MG tablet Take 10 mg by mouth daily. 03/03/23  Yes [provider]  ferrous sulfate 325 (65 FE) MG tablet Take  325 mg by mouth daily with breakfast.   Yes [provider]  losartan (COZAAR) 50 MG tablet Take 50 mg by mouth daily.   Yes [provider]  metFORMIN (GLUCOPHAGE-XR) 500 MG 24 hr tablet Take 500 mg by mouth daily with breakfast. 12/10/14  Yes [provider]  metoprolol tartrate (LOPRESSOR) 25 MG tablet Take 1 tablet (25 mg total) by mouth 2 (two) times daily. 11/08/22  Yes BranchDorothe Pea, MD  Multiple Vitamin (MULTIVITAMIN WITH MINERALS) TABS tablet Take 1 tablet by mouth daily.   Yes [provider]  nitroGLYCERIN (NITROSTAT) 0.4 MG SL tablet Place 1 tablet (0.4 mg total) under the tongue every 5 (five) minutes x 3 doses as needed for chest pain (If no relief after 3rd dose, GO TO ED). 02/08/23 05/17/23 Yes Sharlene Dory, NP  pantoprazole (PROTONIX) 40 MG tablet Take 1 tablet (40 mg total) by mouth daily. Patient taking differently: Take 40 mg by mouth every other day. 01/14/21  Yes Aseel Uhde, Gerrit Friends, MD  pioglitazone (ACTOS) 15 MG tablet Take 15 mg by mouth daily.   Yes [provider]    Allergies as of 05/17/2023 - Review Complete 05/17/2023  Allergen Reaction Noted   Elemental sulfur Hives 06/04/2016    Family History  Problem Relation Age of Onset   Heart attack Father    Dementia Father    Diabetes Sister    Diabetes Brother    Diabetes Sister    Colon cancer Neg Hx     Social History   Socioeconomic History   Marital status: Married    Spouse name: Not on file   Number of children: Not on file   Years of education: Not on file   Highest education level: Not on file  Occupational History   Not on file  Tobacco Use   Smoking status: Some Days    Current packs/day: 0.00    Average packs/day: 0.5 packs/day for 57.0 years (28.5 ttl pk-yrs)    Types: Cigarettes    Start date: 08/01/1965    Last attempt to quit: 08/01/2022    Years since quitting: 0.7    Passive exposure: Never   Smokeless tobacco: Never  Vaping Use   Vaping  status: Never Used  Substance and Sexual Activity   Alcohol use: No    Alcohol/week: 0.0 standard drinks of alcohol   Drug use: No   Sexual activity: Not Currently  Other Topics Concern   Not on file  Social History Narrative   Not on file   Social Determinants of Health   Financial Resource Strain: Not on file  Food Insecurity: Not on file  Transportation Needs: Not on file  Physical  Activity: Not on file  Stress: Not on file  Social Connections: Not on file  Intimate Partner Violence: Not on file    Review of Systems: See HPI, otherwise negative ROS  Physical Exam: BP 121/67 (BP Location: Right Arm, Patient Position: Sitting, Cuff Size: Normal)   Pulse 88   Temp 97.6 F (36.4 C) (Oral)   Ht 5\' 6"  (1.676 m)   Wt 172 lb 12.8 oz (78.4 kg)   SpO2 96%   BMI 27.89 kg/m  She is accompanied by her husband today. General:   Alert,  Well-developed, well-nourished, pleasant and cooperative in NAD Lungs:  Clear throughout to auscultation.   No wheezes, crackles, or rhonchi. No acute distress. Heart:  Regular rate and rhythm; no murmurs, clicks, rubs,  or gallops. Abdomen: Non-distended, normal bowel sounds.  Soft and nontender without appreciable mass or hepatosplenomegaly.   Impression/Plan: 82 year old lady with diabetes presents with a 3 to 55-month history of early satiety, 5 pound weight loss since March of this year, nonspecific abdominal discomfort temporally associated with eating but relieved with a bowel movement.  History of a Schatzki's ring dilated previously with insidious some mild recurrence in symptoms.  MALT lymphoma resolved with treatment.  Follow-up biopsies 2 years ago demonstrated no evidence of lymphoma or H. pylori.  She has new symptoms which are somewhat bothersome.   She could simply have diabetic gastroparesis.  She was set to have a repeat examination in 3 years.  However, given her new symptoms plans are going to change.  History of loose stools/  diarrhea,  some incontinence.  Much improved recently spontaneously.  She has been on metformin for years.  Stool studies negative in March of this year.  Biopsies negative for microscopic colitis 2 years ago.  Recommendations:  As for her new upper GI tract symptoms we will proceed with an EGD-diagnostic.  Will consider dilation of Schatzki's ring as feasible/appropriate at time of examination.  ASA 3.The risks, benefits, limitations, alternatives and imponderables have been reviewed with the patient. Potential for esophageal dilation, biopsy, etc. have also been reviewed.  Questions have been answered. All parties agreeable.   Will need to come off liquids x 2 days prior to the procedure.  Adjustment of diabetes medications per protocol.  If EGD is unrevealing, she may need further diagnostic evaluation.  May need to consider gastric emptying study post EGD.  Tensional for further evaluation discussed with patient and her husband today.      Notice: This dictation was prepared with Dragon dictation along with smaller phrase technology. Any transcriptional errors that result from this process are unintentional and may not be corrected upon review.

## 2023-05-18 ENCOUNTER — Encounter: Payer: Self-pay | Admitting: *Deleted

## 2023-05-18 ENCOUNTER — Ambulatory Visit: Payer: Medicare Other | Attending: Cardiology | Admitting: Cardiology

## 2023-05-18 ENCOUNTER — Telehealth: Payer: Self-pay | Admitting: *Deleted

## 2023-05-18 ENCOUNTER — Encounter: Payer: Self-pay | Admitting: Cardiology

## 2023-05-18 VITALS — BP 124/75 | HR 78 | Ht 66.0 in | Wt 161.6 lb

## 2023-05-18 DIAGNOSIS — D6869 Other thrombophilia: Secondary | ICD-10-CM

## 2023-05-18 DIAGNOSIS — E785 Hyperlipidemia, unspecified: Secondary | ICD-10-CM | POA: Diagnosis not present

## 2023-05-18 DIAGNOSIS — I10 Essential (primary) hypertension: Secondary | ICD-10-CM

## 2023-05-18 DIAGNOSIS — I480 Paroxysmal atrial fibrillation: Secondary | ICD-10-CM

## 2023-05-18 NOTE — Progress Notes (Signed)
Clinical Summary Monica [name] is a 82 y.o.female seen today for follow up of the following medical problems.    1. PAF - has not tolerated amio in the past      - no recent palpitations - compliant with meds. No bleeding on eliquis     2. History of lung cancer - s/p resection - followed by oncology   3. Hyperlipidemia - labs followed by pcp - she is on pravastatin   03/2022 LDL 67  - muscle aches, pcp stopped statin. Symptosm resolved - she is on zetia 10mg  daily now - 07/2022 TC 146 HDL 62 TG 117 LDL 60   4. HTN - often clinic bp's higher than home bp's - she is compliant with meds   5. Lung cancer - followed by oncology, listed as stage I squamous cell - lobectomy in 2019 left side and right side 2020   6. Enlarged pulmonary artery/pulmonic trunk - noted on 06/2021 CT scan, also with signs of emphysema - 2019 echo could not estiamted PASP, normal RV. She did have grade II dd, LVEF 65-70%   08/2019 PFTs: moderate obstruction, moderate diffusion defect 08/2021 echo LVEF 70-75%, grade II dd, normal RV, PASP 38, mild MR   -chronic SOB unchanged - some mild left leg swelling at times.    7. 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     apixaban (ELIQUIS) 5 MG TABS tablet TAKE 1 TABLET BY MOUTH TWICE  DAILY 200 tablet 1   cholecalciferol (VITAMIN D3) 25 MCG (1000 UNIT) tablet Take 1,000 Units by mouth daily.     citalopram (CELEXA) 20 MG tablet Take 20 mg by mouth daily.      Cranberry 125 MG TABS Take by mouth.     ezetimibe (ZETIA) 10 MG tablet Take 10 mg by mouth daily.     ferrous sulfate 325 (65 FE) MG tablet Take 325 mg by mouth daily with breakfast.     losartan (COZAAR) 50 MG tablet Take 50 mg by mouth daily.     metFORMIN (GLUCOPHAGE-XR) 500 MG 24 hr tablet Take 500 mg by mouth daily with breakfast.     metoprolol tartrate (LOPRESSOR) 25 MG tablet Take 1 tablet (25 mg total) by mouth 2 (two) times daily. 180 tablet 3   Multiple Vitamin (MULTIVITAMIN WITH MINERALS) TABS tablet Take 1 tablet by mouth daily.     pantoprazole (PROTONIX) 40 MG tablet Take 1 tablet (40 mg total) by mouth daily. (Patient taking differently: Take 40 mg by  mouth every other day.) 90 tablet 3   pioglitazone (ACTOS) 15 MG tablet Take 15 mg by mouth daily.     nitroGLYCERIN (NITROSTAT) 0.4 MG SL tablet Place 1 tablet (0.4 mg total) under the tongue every 5 (five) minutes x 3 doses as needed for chest pain (If no relief after 3rd dose, GO TO ED). 30 tablet 1   No current facility-administered medications for this visit.     Past Surgical History:  Procedure Laterality Date   ABDOMINAL HYSTERECTOMY     BIOPSY  09/01/2016   Procedure: BIOPSY;  Surgeon: Corbin Ade, MD;  [facility]: [facility];  Service: Endoscopy;;  gastric   BIOPSY  11/29/2018   Procedure: BIOPSY;  Surgeon: Corbin Ade, MD;  Location: [facility];  Service: Endoscopy;;  right colon   BLADDER SURGERY     CATARACT EXTRACTION W/ INTRAOCULAR LENS  IMPLANT, BILATERAL  2015   CATARACT EXTRACTION, BILATERAL     COLONOSCOPY     Dr. Cleotis Nipper 2009: Normal per PCP notes   COLONOSCOPY N/A 11/29/2018    Rourk: Diverticulosis, 3 polyps removed.  No evidence of colitis.  Tubular adenomas.  No future surveillance colonoscopies recommended due to age.   ESOPHAGOGASTRODUODENOSCOPY     RMR: Prominant Schzgzki ring/component of peptic stricture status post dilation and disruption as described above, otherwise norma esophagus, moderate-sized hiatal hernia, antal pyloric channel, and posterier bulbar erosions, otherwise unremarkable stomach, D1 and D2 . Inflammatory findings on the stomach and duodenum will likely be related to aspirin effect. We need to rule out Helicobacter pylor   ESOPHAGOGASTRODUODENOSCOPY N/A 03/10/2015   Dr. 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 Pathology with lymphoid population of stomach, slight atypia   ESOPHAGOGASTRODUODENOSCOPY N/A 02/03/2016   Dr. Jena Gauss: Schatzki ring noted at GE junction, dilated with 56 and then 58 French Maloney dilator. Large hiatal hernia. 6 x 7 cm nodular geographically ulcerated mucosa, biopsy c/w MALToma   ESOPHAGOGASTRODUODENOSCOPY N/A 04/08/2016   Dr. Jena Gauss: moderate Schatzki's ring/web s/p dilation, large hiatal hernia, localized area of gastric lymphoma visualized and appeared to be much improved. normal second portion of the duodenum. No specimens collected. Esophageal lumen notably tighted up significantly since her dilation in April of this year.    ESOPHAGOGASTRODUODENOSCOPY N/A 08/04/2016   Procedure: ESOPHAGOGASTRODUODENOSCOPY (EGD);  Surgeon: Corbin Ade, MD;  [facility]: [facility];  Service: Endoscopy;  Laterality: N/A;  7:30 am   ESOPHAGOGASTRODUODENOSCOPY N/A 09/01/2016   Procedure: ESOPHAGOGASTRODUODENOSCOPY (EGD);  Surgeon: Corbin Ade, MD;  [facility];  Service: Endoscopy;  Laterality: N/A;  830    ESOPHAGOGASTRODUODENOSCOPY N/A 05/10/2017   Dr. Jena Gauss: Moderate Schatzki ring at the GE junction, status post dilation with 54 [place].  Medium sized hiatal  hernia.  Few localized erosions in the gastric antrum.  Stomach biopsy showed chronic gastritis, no H. pylori.  No atypical lymphoid infiltrates or other features of lymphoproliferative process   ESOPHAGOGASTRODUODENOSCOPY N/A 06/13/2018   Dr. Jena Gauss: Schatzki ring status post dilation.  Large hiatal hernia.  Focal area 4 x 4 cm along the greater curvature, somewhat erythematous and thickened mucosa, biopsy benign.  Next EGD in August 2020.   ESOPHAGOGASTRODUODENOSCOPY N/A 01/14/2021   Procedure: ESOPHAGOGASTRODUODENOSCOPY (EGD);  Surgeon: Corbin Ade, MD;  [facility]: [facility];  Service: Endoscopy;  Laterality: N/A;  AM   EYE SURGERY     INTERCOSTAL NERVE BLOCK Right 09/10/2019   Procedure: Intercostal Nerve Block;  Surgeon: Loreli Slot, MD;  Location: [facility] OR;  Service: Thoracic;  Laterality: Right;   LOBECTOMY Left 05/19/2018   Procedure: LEFT LOWER LOBECTOMY;  Surgeon: Loreli Slot, MD;  Location: [facility] OR;  Service: Thoracic;  Laterality: Left;   LYMPH NODE DISSECTION Right 09/10/2019   Procedure: Lymph Node Dissection;  Surgeon: Loreli Slot, MD;  Location[facility] OR;  Service: Thoracic;  Laterality: Right;   MALONEY DILATION N/A 03/10/2015   Procedure: Alvy Beal;  Surgeon: Corbin Ade, MD;  [facility];  Service: Endoscopy;  Laterality: N/A;   MALONEY DILATION N/A 02/03/2016   Procedure: [name] DILATION;  Surgeon: Corbin Ade, MD;  [facility];  Service: Endoscopy;  Laterality: N/A;   MALONEY DILATION N/A 04/08/2016   Procedure: [name] DILATION;  Surgeon: Corbin Ade, MD;  [facility];  Service: Endoscopy;  Laterality: N/A;   MALONEY DILATION N/A 05/10/2017   Procedure: [name] DILATION;  Surgeon: Corbin Ade, MD;  [facility];  Service: Endoscopy;  Laterality: N/A;   MALONEY DILATION N/A 06/13/2018   Procedure: [name] DILATION;  Surgeon: Corbin Ade, MD;  [facility];  Service: Endoscopy;   Laterality: N/A;   MALONEY DILATION N/A 01/14/2021   Procedure: [name] DILATION;  Surgeon: Corbin Ade, MD;  [facility];  Service: Endoscopy;  Laterality: N/A;   POLYPECTOMY  11/29/2018   Procedure: POLYPECTOMY;  Surgeon: Corbin Ade, MD;  [facility];  Service: Endoscopy;;   VIDEO ASSISTED THORACOSCOPY (VATS)/ LOBECTOMY Right 09/10/2019   Procedure: VIDEO ASSISTED THORACOSCOPY (VATS)/RIGHT LOWER LOBECTOMY;  Surgeon: Loreli Slot, MD;  Location: [facility] OR;  Service: Thoracic;  Laterality: Right;   VIDEO ASSISTED THORACOSCOPY (VATS)/WEDGE RESECTION Left 05/19/2018   Procedure: VIDEO ASSISTED THORACOSCOPY (VATS)/WEDGE RESECTION;  Surgeon: Loreli Slot, MD;  Location: [facility] OR;  Service: Thoracic;  Laterality: Left;     Allergies  Allergen Reactions   Elemental Sulfur Hives      Family History  Problem Relation Age of Onset   Heart attack Father    Dementia Father    Diabetes Sister    Diabetes Brother    Diabetes Sister    Colon cancer Neg Hx      Social History Monica [name] reports that she has been smoking cigarettes. She started smoking about 57 years ago. She has a 28.5 pack-year smoking history. She has never been exposed to tobacco smoke. She has never used smokeless tobacco. Monica [name] reports no history of alcohol use.   Review of Systems CONSTITUTIONAL: No weight loss, fever, chills, weakness or fatigue.  HEENT: Eyes: No visual loss, blurred vision, double vision or yellow sclerae.No hearing loss, sneezing, congestion, runny nose or sore throat.  SKIN: No rash or itching.  CARDIOVASCULAR: per hpi RESPIRATORY: No shortness of breath, cough or sputum.  GASTROINTESTINAL: No anorexia, nausea, vomiting or diarrhea. No abdominal pain or blood.  GENITOURINARY: No burning on urination, no polyuria NEUROLOGICAL: No headache, dizziness, syncope, paralysis, ataxia, numbness or tingling in the extremities. No change in bowel or bladder control.   MUSCULOSKELETAL: No muscle, back pain, joint pain or stiffness.  LYMPHATICS: No enlarged nodes. No history of splenectomy.  PSYCHIATRIC: No history of depression or anxiety.  ENDOCRINOLOGIC: No reports of sweating, cold or heat intolerance. No polyuria or polydipsia.  Marland Kitchen   Physical Examination Today's Vitals   05/18/23 1059 05/18/23 1127  BP: (!) 140/84 124/75  Pulse: 78  SpO2: 92%   Weight: 161 lb 9.6 oz (73.3 kg)   Height: 5\' 6"  (1.676 m)    Body mass index is 26.08 kg/m.  Gen: resting comfortably, no acute distress HEENT: no scleral icterus, pupils equal round and reactive, no palptable cervical adenopathy,  CV: irreg, no mrg, no jvd Resp: Clear to auscultation bilaterally GI: abdomen is soft, non-tender, non-distended, normal bowel sounds, no hepatosplenomegaly MSK: extremities are warm, no edema.  Skin: warm, no rash Neuro:  no focal deficits Psych: appropriate affect   Diagnostic Studies ECHO 2019: - Left ventricle: The cavity size was normal. Wall thickness was   increased in a pattern of moderate LVH. Systolic function was   vigorous. The estimated ejection fraction was in the range of 65%   to 70%. Wall motion was normal; there were no regional wall   motion abnormalities. Features are consistent with a pseudonormal   left ventricular filling pattern, with concomitant abnormal   relaxation and increased filling pressure (grade 2 diastolic   dysfunction). - Aortic valve: Mildly calcified annulus. Trileaflet. - Mitral valve: There was mild regurgitation. - Left atrium: The atrium was moderately dilated. - Right atrium: Central venous pressure (est): 3 mm Hg. - Atrial septum: No defect or patent foramen ovale was identified. - Tricuspid valve: There was trivial regurgitation. - Pulmonary arteries: Systolic pressure could not be accurately   estimated. - Pericardium, extracardiac: There was no pericardial effusion   08/2021 echo 1. Left ventricular ejection  fraction, by estimation, is 70 to 75%. The  left ventricle has hyperdynamic function. The left ventricle has no  regional wall motion abnormalities. There is moderate concentric left  ventricular hypertrophy. Left ventricular  diastolic parameters are consistent with Grade II diastolic dysfunction  (pseudonormalization).   2. Right ventricular systolic function is normal. The right ventricular  size is normal. There is mildly elevated pulmonary artery systolic  pressure. The estimated right ventricular systolic pressure is 38.8 mmHg.   3. Left atrial size was mildly dilated.   4. The mitral valve is abnormal. Mild mitral valve regurgitation.   5. The aortic valve is tricuspid. Aortic valve regurgitation is not  visualized. Mild aortic valve sclerosis is present, with no evidence of  aortic valve stenosis.   6. The inferior vena cava is normal in size with greater than 50%  respiratory variability, suggesting right atrial pressure of 3 mmHg.   Comparison(s): Prior images unable to be directly viewed.     Assessment and Plan  1. PAF/acquired thrombophilia -no symptoms, continue current meds including eliquis for stroke prevention   2. HTN -she is at goal, continue current meds   3. Hyperlipidemia - pcp had stopped statin due to muscle aches, on zetia with improved symptoms. F/u pcp labs.    4. Pulmonary HTN - mild by echo with normal RV, some enlargement of PA on prior CT scans - likely secondary to COPD by PFTs, grade II diastolic dysfunction - no further workup indicated.   5. Coronary atherosclerosis - noted on CT scan - asymptomatic, continue statin. No ASA since on eliquis.    F/u 6 months  Antoine Poche, M.D.

## 2023-05-18 NOTE — Telephone Encounter (Signed)
[employer] PA: Notification or Prior Authorization is not required for the requested services You are not required to submit a notification/prior authorization based on the information provided. If you have general questions about the prior authorization requirements, visit [email or web address] > Clinician Resources > Advance and Admission Notification Requirements. The number above acknowledges your notification. Please write this reference number down for future reference. If you would like to request an organization determination, please call us at [phone]. Decision ID #: [id number]

## 2023-05-18 NOTE — Patient Instructions (Signed)

## 2023-05-23 ENCOUNTER — Ambulatory Visit (HOSPITAL_COMMUNITY)
Admission: RE | Admit: 2023-05-23 | Discharge: 2023-05-23 | Disposition: A | Discharge status: Home / Self Care | Payer: Medicare Other | Source: Ambulatory Visit | Attending: Hematology | Admitting: Hematology

## 2023-05-23 ENCOUNTER — Inpatient Hospital Stay: Payer: Medicare Other | Attending: Hematology

## 2023-05-23 DIAGNOSIS — M810 Age-related osteoporosis without current pathological fracture: Secondary | ICD-10-CM | POA: Insufficient documentation

## 2023-05-23 DIAGNOSIS — C3431 Malignant neoplasm of lower lobe, right bronchus or lung: Secondary | ICD-10-CM | POA: Insufficient documentation

## 2023-05-23 DIAGNOSIS — Z8572 Personal history of non-Hodgkin lymphomas: Secondary | ICD-10-CM | POA: Insufficient documentation

## 2023-05-23 DIAGNOSIS — E559 Vitamin D deficiency, unspecified: Secondary | ICD-10-CM | POA: Insufficient documentation

## 2023-05-23 DIAGNOSIS — Z85118 Personal history of other malignant neoplasm of bronchus and lung: Secondary | ICD-10-CM | POA: Insufficient documentation

## 2023-05-23 DIAGNOSIS — J432 Centrilobular emphysema: Secondary | ICD-10-CM | POA: Diagnosis not present

## 2023-05-23 DIAGNOSIS — C3490 Malignant neoplasm of unspecified part of unspecified bronchus or lung: Secondary | ICD-10-CM | POA: Diagnosis not present

## 2023-05-23 DIAGNOSIS — Z902 Acquired absence of lung [part of]: Secondary | ICD-10-CM | POA: Diagnosis not present

## 2023-05-23 DIAGNOSIS — Z8701 Personal history of pneumonia (recurrent): Secondary | ICD-10-CM | POA: Diagnosis not present

## 2023-05-23 DIAGNOSIS — E611 Iron deficiency: Secondary | ICD-10-CM

## 2023-05-23 DIAGNOSIS — K449 Diaphragmatic hernia without obstruction or gangrene: Secondary | ICD-10-CM | POA: Diagnosis not present

## 2023-05-23 LAB — COMPREHENSIVE METABOLIC PANEL
ALT: 16 U/L (ref 0–44)
AST: 20 U/L (ref 15–41)
Albumin: 3.9 g/dL (ref 3.5–5.0)
Alkaline Phosphatase: 59 U/L (ref 38–126)
Anion gap: 11 (ref 5–15)
BUN: 13 mg/dL (ref 8–23)
CO2: 24 mmol/L (ref 22–32)
Calcium: 9.2 mg/dL (ref 8.9–10.3)
Chloride: 100 mmol/L (ref 98–111)
Creatinine, Ser: 0.8 mg/dL (ref 0.44–1.00)
GFR, Estimated: >60 mL/min (ref >60)
Glucose, Bld: 242 mg/dL — ABNORMAL HIGH (ref 70–99)
Potassium: 4 mmol/L (ref 3.5–5.1)
Sodium: 135 mmol/L (ref 135–145)
Total Bilirubin: 0.5 mg/dL (ref 0.3–1.2)
Total Protein: 7.7 g/dL (ref 6.5–8.1)

## 2023-05-23 LAB — CBC WITH DIFFERENTIAL/PLATELET
Abs Immature Granulocytes: 0.02 10*3/uL (ref 0.00–0.07)
Basophils Absolute: 0 10*3/uL (ref 0.0–0.1)
Basophils Relative: 1 %
Eosinophils Absolute: 0.1 10*3/uL (ref 0.0–0.5)
Eosinophils Relative: 1 %
HCT: 42.4 % (ref 36.0–46.0)
Hemoglobin: 13.9 g/dL (ref 12.0–15.0)
Immature Granulocytes: 0 %
Lymphocytes Relative: 39 %
Lymphs Abs: 2.4 10*3/uL (ref 0.7–4.0)
MCH: 29.3 pg (ref 26.0–34.0)
MCHC: 32.8 g/dL (ref 30.0–36.0)
MCV: 89.5 fL (ref 80.0–100.0)
Monocytes Absolute: 0.4 10*3/uL (ref 0.1–1.0)
Monocytes Relative: 7 %
Neutro Abs: 3.2 10*3/uL (ref 1.7–7.7)
Neutrophils Relative %: 52 %
Platelets: 271 10*3/uL (ref 150–400)
RBC: 4.74 MIL/uL (ref 3.87–5.11)
RDW: 15.5 % (ref 11.5–15.5)
WBC: 6.1 10*3/uL (ref 4.0–10.5)
nRBC: 0 % (ref 0.0–0.2)

## 2023-05-23 LAB — IRON AND TIBC
Iron: 112 ug/dL (ref 28–170)
Saturation Ratios: 30 % (ref 10.4–31.8)
TIBC: 376 ug/dL (ref 250–450)
UIBC: 264 ug/dL

## 2023-05-23 LAB — VITAMIN D 25 HYDROXY (VIT D DEFICIENCY, FRACTURES): Vit D, 25-Hydroxy: 40.59 ng/mL (ref 30–100)

## 2023-05-23 LAB — FERRITIN: Ferritin: 42 ng/mL (ref 11–307)

## 2023-05-24 ENCOUNTER — Telehealth: Payer: Self-pay | Admitting: *Deleted

## 2023-05-24 NOTE — Progress Notes (Signed)
  Care Coordination   Note   05/24/2023 Name: Monica [name] MRN: [id number] DOB: [birth year]  Monica [name] is a 82 y.o. year old female who sees Sasser, Clarene Critchley, MD for primary care. I reached out to Salvadore Oxford by phone today to offer care coordination services.  [name] was given information about Care Coordination services today including:   The Care Coordination services include support from the care team which includes your Nurse Coordinator, Clinical Social Worker, or Pharmacist.  The Care Coordination team is here to help remove barriers to the health concerns and goals most important to you. Care Coordination services are voluntary, and the patient may decline or stop services at any time by request to their care team member.   Care Coordination Consent Status: Patient did not agree to participate in care coordination services at this time.    Encounter Outcome:  Pt. Refused  [facility] Coordination Care Guide  Direct Dial: [phone]

## 2023-05-29 NOTE — Progress Notes (Signed)
[facility] [address] [place] [address]    Clinic Day:  05/29/2023  Referring physician: Estanislado Pandy, MD  Patient Care Team: Estanislado Pandy, MD as PCP - General (Family Medicine) Wyline Mood Dorothe Pea, MD as PCP - Cardiology (Cardiology) Jena Gauss Gerrit Friends, MD as Consulting Physician (Gastroenterology) Sharlene Dory, NP as Nurse Practitioner (Cardiology)   ASSESSMENT & PLAN:   Assessment:  1.  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 -Adjuvant therapy was not recommended.   2.  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   3.  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  H. pylori negative. -XRT 30 Gray in 15 fractions from 03/18/2016 through 04/18/2016. -EGD biopsy on 06/13/2018 shows mild chronic gastritis and small lymphoid aggregates with no features of lymphoma. -PET scan on 08/13/2019 did not show any abnormal uptake associated with abdominal organs.  No lymphadenopathy. -Colonoscopy on 11/29/2018 showed diverticulosis in the sigmoid colon with 2 subcentimeter polyps in sigmoid colon.  Plan:  1.  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  No suspicious areas were seen.  Other benign findings discussed. - RTC 6 months with CT chest without contrast.   2.  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   3.  Gastric marginal zone lymphoma: - No B symptoms or palpable adenopathy.   LDH is normal.   4.  Low vitamin D levels: - She is taking multivitamin tablet daily.  Vitamin D is low at 27.  I have recommended her to take vitamin D 400 units daily.  No orders of the defined types were placed in this encounter.     Alben Deeds Teague,acting as a Neurosurgeon for Doreatha Massed, MD.,have documented all relevant documentation on the behalf of Doreatha Massed, MD,as directed by  Doreatha Massed, MD while in the presence of Doreatha Massed, MD.   ***  Deweese R Teague   7/28/202411:26 PM  CHIEF COMPLAINT:   Diagnosis: Malignant neoplasm of lower lobe of right lung   Cancer Staging  Lung cancer [facility]) Staging form: Lung, AJCC 8th Edition - Clinical: cT1b, cN0 - Signed by Ahmed Prima, MD on 06/06/2018  MALToma [facility]) Staging form: Lymphoid Neoplasms, AJCC 6th Edition - Clinical stage from 03/23/2016: Stage I - Signed by Ellouise Newer, PA-C on 03/23/2016    Prior Therapy: 1. Left lower lobectomy on 05/19/2018. 2. VATS with right lower lobectomy on 09/10/2019.  Current Therapy:  surveillance   HISTORY OF PRESENT ILLNESS:   Oncology History  MALToma (HCC)  03/21/2015 Miscellaneous   H Pylori IgG NEGATIVE   02/03/2016 Procedure   EGD Dr. Jena Gauss, abnormal gastric mucosa. Pathology with EXTRANODAL Marginal zone lymphoma. NEGATIVE for H. Pylori   03/03/2016 Miscellaneous   H pylori stool antigen NEGATIVE   03/03/2016 PET scan   Focal area of hypermetabolism and wall thickening involving the body antral junction region of the stomach c/w history of lymphoma. 5.5 mm LLL pulm nodule not hypermetabolic. Non contrast chest CT  in 6 month   03/18/2016 - 04/08/2016 Radiation Therapy   The gastric tumor received 30 Gy in 15 fractions of 2 Gy   Lung cancer (HCC)  05/19/2018 Initial Diagnosis   Lung cancer (HCC)   06/06/2018 Cancer Staging   Staging form: Lung, AJCC 8th Edition - Clinical: cT1b, cN0 - Signed by Ahmed Prima, MD on 06/06/2018      INTERVAL  HISTORY:   Monica [name] is a 82 y.o. female presenting to clinic today for follow up of left and right lung cancer. She was last seen by me on 11/22/22.  She underwent a chest CT on 7/22 that found: ***. Patient has am esophagogastroduodenoscopy schedule on 06/16/23 woth Dr. Jena Gauss.   Today, she states that she is doing well overall. Her appetite level is at ***%. Her energy level is at ***%.  PAST MEDICAL HISTORY:   Past Medical History: Past Medical History:  Diagnosis Date   Cancer (HCC)    NHL, Malt Lymphoma   Depression    Diverticulitis 04/28/2020   DM (diabetes mellitus) (HCC)    TYPE  2   Dysphagia    Dysrhythmia    History of palpatations   GERD (gastroesophageal reflux disease)    INGESTION    Heart palpitations    Hiatal hernia    Hyperlipidemia    Interstitial cystitis    Iron deficiency 04/28/2020   Lung cancer, lower lobe (HCC) 07/21/2018   Left lower lobe, stage Ia squamous cell carcinoma   MALT (mucosa associated lymphoid tissue) (HCC)    gastric   Sinusitis     Surgical History: Past Surgical History:  Procedure Laterality Date   ABDOMINAL HYSTERECTOMY     BIOPSY  09/01/2016   Procedure: BIOPSY;  Surgeon: Corbin Ade, MD;  [facility];  Service: Endoscopy;;  gastric   BIOPSY  11/29/2018   Procedure: BIOPSY;  Surgeon: Corbin Ade, MD;  [facility];  Service: Endoscopy;;  right colon   BLADDER SURGERY     CATARACT EXTRACTION W/ INTRAOCULAR LENS  IMPLANT, BILATERAL  2015   CATARACT EXTRACTION, BILATERAL     COLONOSCOPY     Dr. Cleotis Nipper 2009: Normal per PCP notes   COLONOSCOPY N/A 11/29/2018   Rourk: Diverticulosis, 3 polyps removed.  No evidence of colitis.  Tubular adenomas.  No future surveillance colonoscopies recommended due to age.   ESOPHAGOGASTRODUODENOSCOPY     RMR: Prominant Schzgzki ring/component of peptic stricture status post dilation and disruption as described above, otherwise norma esophagus, moderate-sized hiatal  hernia, antal pyloric channel, and posterier bulbar erosions, otherwise unremarkable stomach, D1 and D2 . Inflammatory findings on the stomach and duodenum will likely be related to aspirin effect. We need to rule out Helicobacter pylor   ESOPHAGOGASTRODUODENOSCOPY N/A 03/10/2015   Dr. 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 Pathology with lymphoid population of stomach, slight atypia   ESOPHAGOGASTRODUODENOSCOPY N/A 02/03/2016   Dr. Jena Gauss: Schatzki ring noted at GE junction, dilated with 56 and then 58 French Maloney dilator. Large hiatal hernia. 6 x 7 cm nodular geographically ulcerated mucosa, biopsy c/w MALToma   ESOPHAGOGASTRODUODENOSCOPY N/A 04/08/2016   Dr. Jena Gauss: moderate Schatzki's ring/web s/p dilation, large hiatal hernia, localized area of gastric lymphoma visualized and appeared to be much improved. normal second portion of the duodenum. No specimens collected. Esophageal lumen notably tighted up significantly since her dilation in April of this year.    ESOPHAGOGASTRODUODENOSCOPY N/A 08/04/2016  Procedure: ESOPHAGOGASTRODUODENOSCOPY (EGD);  Surgeon: Corbin Ade, MD;  [facility];  Service: Endoscopy;  Laterality: N/A;  7:30 am   ESOPHAGOGASTRODUODENOSCOPY N/A 09/01/2016   Procedure: ESOPHAGOGASTRODUODENOSCOPY (EGD);  Surgeon: Corbin Ade, MD;  [facility];  Service: Endoscopy;  Laterality: N/A;  830    ESOPHAGOGASTRODUODENOSCOPY N/A 05/10/2017   Dr. Jena Gauss: Moderate Schatzki ring at the GE junction, status post dilation with 54 [place].  Medium sized hiatal hernia.  Few localized erosions in the gastric antrum.  Stomach biopsy showed chronic gastritis, no H. pylori.  No atypical lymphoid infiltrates or other features of lymphoproliferative process   ESOPHAGOGASTRODUODENOSCOPY N/A 06/13/2018   Dr. Jena Gauss: Schatzki ring status post dilation.  Large hiatal hernia.  Focal area 4 x 4 cm along the greater  curvature, somewhat erythematous and thickened mucosa, biopsy benign.  Next EGD in August 2020.   ESOPHAGOGASTRODUODENOSCOPY N/A 01/14/2021   Procedure: ESOPHAGOGASTRODUODENOSCOPY (EGD);  Surgeon: Corbin Ade, MD;  [facility];  Service: Endoscopy;  Laterality: N/A;  AM   EYE SURGERY     INTERCOSTAL NERVE BLOCK Right 09/10/2019   Procedure: Intercostal Nerve Block;  Surgeon: Loreli Slot, MD;  Location: [facility] OR;  Service: Thoracic;  Laterality: Right;   LOBECTOMY Left 05/19/2018   Procedure: LEFT LOWER LOBECTOMY;  Surgeon: Loreli Slot, MD;  Location: [facility] OR;  Service: Thoracic;  Laterality: Left;   LYMPH NODE DISSECTION Right 09/10/2019   Procedure: Lymph Node Dissection;  Surgeon: Loreli Slot, MD;  Location: [facility] OR;  Service: Thoracic;  Laterality: Right;   MALONEY DILATION N/A 03/10/2015   Procedure: Alvy Beal;  Surgeon: Corbin Ade, MD;  [facility];  Service: Endoscopy;  Laterality: N/A;   MALONEY DILATION N/A 02/03/2016   Procedure: [name] DILATION;  Surgeon: Corbin Ade, MD;  [facility];  Service: Endoscopy;  Laterality: N/A;   MALONEY DILATION N/A 04/08/2016   Procedure: [name] DILATION;  Surgeon: Corbin Ade, MD;  [facility];  Service: Endoscopy;  Laterality: N/A;   MALONEY DILATION N/A 05/10/2017   Procedure: [name] DILATION;  Surgeon: Corbin Ade, MD;  [facility];  Service: Endoscopy;  Laterality: N/A;   MALONEY DILATION N/A 06/13/2018   Procedure: [name] DILATION;  Surgeon: Corbin Ade, MD;  [facility];  Service: Endoscopy;  Laterality: N/A;   MALONEY DILATION N/A 01/14/2021   Procedure: [name] DILATION;  Surgeon: Corbin Ade, MD;  [facility];  Service: Endoscopy;  Laterality: N/A;   POLYPECTOMY  11/29/2018   Procedure: POLYPECTOMY;  Surgeon: Corbin Ade, MD;  [facility];  Service: Endoscopy;;   VIDEO ASSISTED THORACOSCOPY (VATS)/  LOBECTOMY Right 09/10/2019   Procedure: VIDEO ASSISTED THORACOSCOPY (VATS)/RIGHT LOWER LOBECTOMY;  Surgeon: Loreli Slot, MD;  Location: [facility] OR;  Service: Thoracic;  Laterality: Right;   VIDEO ASSISTED THORACOSCOPY (VATS)/WEDGE RESECTION Left 05/19/2018   Procedure: VIDEO ASSISTED THORACOSCOPY (VATS)/WEDGE RESECTION;  Surgeon: Loreli Slot, MD;  Location: [facility] OR;  Service: Thoracic;  Laterality: Left;    Social History: Social History   Socioeconomic History   Marital status: Married    Spouse name: Not on file   Number of children: Not on file   Years of education: Not on file   Highest education level: Not on file  Occupational History   Not on file  Tobacco Use   Smoking status: Some Days    Current packs/day:  0.00    Average packs/day: 0.5 packs/day for 57.0 years (28.5 ttl pk-yrs)    Types: Cigarettes    Start date: 08/01/1965    Last attempt to quit: 08/01/2022    Years since quitting: 0.8    Passive exposure: Never   Smokeless tobacco: Never  Vaping Use   Vaping status: Never Used  Substance and Sexual Activity   Alcohol use: No    Alcohol/week: 0.0 standard drinks of alcohol   Drug use: No   Sexual activity: Not Currently  Other Topics Concern   Not on file  Social History Narrative   Not on file   Social Determinants of Health   Financial Resource Strain: Not on file  Food Insecurity: Not on file  Transportation Needs: Not on file  Physical Activity: Not on file  Stress: Not on file  Social Connections: Not on file  Intimate Partner Violence: Not on file    Family History: Family History  Problem Relation Age of Onset   Heart attack Father    Dementia Father    Diabetes Sister    Diabetes Brother    Diabetes Sister    Colon cancer Neg Hx     Current Medications:  Current Outpatient Medications:    amLODipine (NORVASC) 5 MG tablet, Take 5 mg by mouth daily., Disp: , Rfl:    apixaban (ELIQUIS) 5 MG TABS tablet, TAKE 1 TABLET BY MOUTH  TWICE  DAILY, Disp: 200 tablet, Rfl: 1   cholecalciferol (VITAMIN D3) 25 MCG (1000 UNIT) tablet, Take 1,000 Units by mouth daily., Disp: , Rfl:    citalopram (CELEXA) 20 MG tablet, Take 20 mg by mouth daily. , Disp: , Rfl:    Cranberry 125 MG TABS, Take by mouth., Disp: , Rfl:    ezetimibe (ZETIA) 10 MG tablet, Take 10 mg by mouth daily., Disp: , Rfl:    ferrous sulfate 325 (65 FE) MG tablet, Take 325 mg by mouth daily with breakfast., Disp: , Rfl:    losartan (COZAAR) 50 MG tablet, Take 50 mg by mouth daily., Disp: , Rfl:    metFORMIN (GLUCOPHAGE-XR) 500 MG 24 hr tablet, Take 500 mg by mouth daily with breakfast., Disp: , Rfl:    metoprolol tartrate (LOPRESSOR) 25 MG tablet, Take 1 tablet (25 mg total) by mouth 2 (two) times daily., Disp: 180 tablet, Rfl: 3   Multiple Vitamin (MULTIVITAMIN WITH MINERALS) TABS tablet, Take 1 tablet by mouth daily., Disp: , Rfl:    nitroGLYCERIN (NITROSTAT) 0.4 MG SL tablet, Place 1 tablet (0.4 mg total) under the tongue every 5 (five) minutes x 3 doses as needed for chest pain (If no relief after 3rd dose, GO TO ED)., Disp: 30 tablet, Rfl: 1   pantoprazole (PROTONIX) 40 MG tablet, Take 1 tablet (40 mg total) by mouth daily. (Patient taking differently: Take 40 mg by mouth every other day.), Disp: 90 tablet, Rfl: 3   pioglitazone (ACTOS) 15 MG tablet, Take 15 mg by mouth daily., Disp: , Rfl:    Allergies: Allergies  Allergen Reactions   Elemental Sulfur Hives    REVIEW OF SYSTEMS:   Review of Systems  Constitutional:  Negative for chills, fatigue and fever.  HENT:   Negative for lump/mass, mouth sores, nosebleeds, sore throat and trouble swallowing.   Eyes:  Negative for eye problems.  Respiratory:  Negative for cough and shortness of breath.   Cardiovascular:  Negative for chest pain, leg swelling and palpitations.  Gastrointestinal:  Negative for abdominal pain,  constipation, diarrhea, nausea and vomiting.  Genitourinary:  Negative for bladder  incontinence, difficulty urinating, dysuria, frequency, hematuria and nocturia.   Musculoskeletal:  Negative for arthralgias, back pain, flank pain, myalgias and neck pain.  Skin:  Negative for itching and rash.  Neurological:  Negative for dizziness, headaches and numbness.  Hematological:  Does not bruise/bleed easily.  Psychiatric/Behavioral:  Negative for depression, sleep disturbance and suicidal ideas. The patient is not nervous/anxious.   All other systems reviewed and are negative.    VITALS:   There were no vitals taken for this visit.  Wt Readings from Last 3 Encounters:  05/18/23 161 lb 9.6 oz (73.3 kg)  05/17/23 172 lb 12.8 oz (78.4 kg)  02/04/23 176 lb (79.8 kg)    There is no height or weight on file to calculate BMI.  Performance status (ECOG): {CHL ONC [id number]  PHYSICAL EXAM:   Physical Exam Vitals and nursing note reviewed. Exam conducted with a chaperone present.  Constitutional:      Appearance: Normal appearance.  Cardiovascular:     Rate and Rhythm: Normal rate and regular rhythm.     Pulses: Normal pulses.     Heart sounds: Normal heart sounds.  Pulmonary:     Effort: Pulmonary effort is normal.     Breath sounds: Normal breath sounds.  Abdominal:     Palpations: Abdomen is soft. There is no hepatomegaly, splenomegaly or mass.     Tenderness: There is no abdominal tenderness.  Musculoskeletal:     Right lower leg: No edema.     Left lower leg: No edema.  Lymphadenopathy:     Cervical: No cervical adenopathy.     Right cervical: No superficial, deep or posterior cervical adenopathy.    Left cervical: No superficial, deep or posterior cervical adenopathy.     Upper Body:     Right upper body: No supraclavicular or axillary adenopathy.     Left upper body: No supraclavicular or axillary adenopathy.  Neurological:     General: No focal deficit present.     Mental Status: She is alert and oriented to person, place, and time.  Psychiatric:         Mood and Affect: Mood normal.        Behavior: Behavior normal.     LABS:      Latest Ref Rng & Units 05/23/2023   12:06 PM 01/23/2023    4:29 AM 11/18/2022   12:57 PM  CBC  WBC 4.0 - 10.5 K/uL 6.1  14.3  6.7   Hemoglobin 12.0 - 15.0 g/dL 40.9  81.1  91.4   Hematocrit 36.0 - 46.0 % 42.4  40.1  36.9   Platelets 150 - 400 K/uL 271  294  243       Latest Ref Rng & Units 05/23/2023   12:06 PM 01/23/2023    4:29 AM 11/18/2022   12:57 PM  CMP  Glucose 70 - 99 mg/dL 782  956  213   BUN 8 - 23 mg/dL 13  14  15    Creatinine 0.44 - 1.00 mg/dL 0.86  5.78  4.69   Sodium 135 - 145 mmol/L 135  135  134   Potassium 3.5 - 5.1 mmol/L 4.0  4.0  3.8   Chloride 98 - 111 mmol/L 100  102  100   CO2 22 - 32 mmol/L 24  23  24    Calcium 8.9 - 10.3 mg/dL 9.2  8.9  8.6   Total Protein 6.5 -  8.1 g/dL 7.7   7.2   Total Bilirubin 0.3 - 1.2 mg/dL 0.5   0.3   Alkaline Phos 38 - 126 U/L 59   58   AST 15 - 41 U/L 20   25   ALT 0 - 44 U/L 16   18      No results found for: "CEA1", "CEA" / No results found for: "CEA1", "CEA" No results found for: "PSA1" No results found for: "GNF621" No results found for: "CAN125"  Lab Results  Component Value Date   TOTALPROTELP 6.6 03/01/2016   TOTALPROTELP 6.4 03/01/2016   ALBUMINELP 3.7 03/01/2016   A1GS 0.1 03/01/2016   A2GS 0.8 03/01/2016   BETS 1.0 03/01/2016   GAMS 0.9 03/01/2016   MSPIKE Not Observed 03/01/2016   SPEI Comment 03/01/2016   Lab Results  Component Value Date   TIBC 376 05/23/2023   TIBC 341 11/18/2022   TIBC 351 06/17/2022   FERRITIN 42 05/23/2023   FERRITIN 27 11/18/2022   FERRITIN 38 06/17/2022   IRONPCTSAT 30 05/23/2023   IRONPCTSAT 19 11/18/2022   IRONPCTSAT 24 06/17/2022   Lab Results  Component Value Date   LDH 111 11/18/2022   LDH 119 06/17/2022   LDH 122 12/17/2021     STUDIES:   No results found.

## 2023-05-30 ENCOUNTER — Inpatient Hospital Stay: Payer: Medicare Other | Admitting: Hematology

## 2023-05-30 VITALS — BP 140/70 | HR 88 | Temp 98.0°F | Resp 18 | Wt 177.0 lb

## 2023-05-30 DIAGNOSIS — M810 Age-related osteoporosis without current pathological fracture: Secondary | ICD-10-CM | POA: Diagnosis not present

## 2023-05-30 DIAGNOSIS — E559 Vitamin D deficiency, unspecified: Secondary | ICD-10-CM | POA: Diagnosis not present

## 2023-05-30 DIAGNOSIS — C3431 Malignant neoplasm of lower lobe, right bronchus or lung: Secondary | ICD-10-CM

## 2023-05-30 DIAGNOSIS — S22000A Wedge compression fracture of unspecified thoracic vertebra, initial encounter for closed fracture: Secondary | ICD-10-CM

## 2023-05-30 DIAGNOSIS — Z85118 Personal history of other malignant neoplasm of bronchus and lung: Secondary | ICD-10-CM | POA: Diagnosis not present

## 2023-05-30 DIAGNOSIS — Z8572 Personal history of non-Hodgkin lymphomas: Secondary | ICD-10-CM | POA: Diagnosis not present

## 2023-05-30 DIAGNOSIS — E611 Iron deficiency: Secondary | ICD-10-CM | POA: Diagnosis not present

## 2023-05-30 DIAGNOSIS — Z902 Acquired absence of lung [part of]: Secondary | ICD-10-CM | POA: Diagnosis not present

## 2023-05-30 NOTE — Patient Instructions (Signed)
[facility] at [facility] Discharge Instructions   You were seen and examined today by Dr. Ellin Saba.  He reviewed the results of your CT scan which was clear of cancer in the lungs. It did show compression of the vertebrae in your back. Please call Dr. Dian Situ office and have them arrange for you to have a bone density test.   We will see you back in 6 months. We will repeat a CT scan and lab work prior to this visit.   Return as scheduled.   Thank you for choosing [facility] at [facility] to provide your oncology and hematology care.  To afford each patient quality time with our provider, please arrive at least 15 minutes before your scheduled appointment time.   If you have a lab appointment with the Cancer Center please come in thru the Main Entrance and check in at the main information desk.  You need to re-schedule your appointment should you arrive 10 or more minutes late.  We strive to give you quality time with our providers, and arriving late affects you and other patients whose appointments are after yours.  Also, if you no show three or more times for appointments you may be dismissed from the clinic at the providers discretion.     Again, thank you for choosing [facility].  Our hope is that these requests will decrease the amount of time that you wait before being seen by our physicians.       _____________________________________________________________  Should you have questions after your visit to [facility], please contact our office at [phone] and follow the prompts.  Our office hours are 8:00 a.m. and 4:30 p.m. Monday - Friday.  Please note that voicemails left after 4:00 p.m. may not be returned until the following business day.  We are closed weekends and major holidays.  You do have access to a nurse 24-7, just call the main number to the clinic [phone] and do not press any options,  hold on the line and a nurse will answer the phone.    For prescription refill requests, have your pharmacy contact our office and allow 72 hours.    Due to Covid, you will need to wear a mask upon entering the hospital. If you do not have a mask, a mask will be given to you at the Main Entrance upon arrival. For doctor visits, patients may have 1 support person age 16 or older with them. For treatment visits, patients can not have anyone with them due to social distancing guidelines and our immunocompromised population.

## 2023-06-13 NOTE — Telephone Encounter (Signed)
Pt left vm stating she had a pre-op visit on Wednesday and needed to know where to go  Called pt, numerous rings, no answer

## 2023-06-13 NOTE — Telephone Encounter (Signed)
Spoke to pt and informed her where to go to for pre-op. Verbalized understanding.

## 2023-06-14 ENCOUNTER — Encounter (HOSPITAL_COMMUNITY): Payer: Self-pay

## 2023-06-14 ENCOUNTER — Other Ambulatory Visit: Payer: Self-pay

## 2023-06-15 ENCOUNTER — Encounter (HOSPITAL_COMMUNITY)
Admission: RE | Admit: 2023-06-15 | Discharge: 2023-06-15 | Disposition: A | Discharge status: Home / Self Care | Payer: Medicare Other | Source: Ambulatory Visit | Attending: Internal Medicine | Admitting: Internal Medicine

## 2023-06-16 ENCOUNTER — Encounter (HOSPITAL_COMMUNITY)
Admission: RE | Disposition: A | Discharge status: Home / Self Care | Payer: Self-pay | Source: Home / Self Care | Attending: Internal Medicine

## 2023-06-16 ENCOUNTER — Ambulatory Visit (HOSPITAL_BASED_OUTPATIENT_CLINIC_OR_DEPARTMENT_OTHER): Payer: Medicare Other | Admitting: Anesthesiology

## 2023-06-16 ENCOUNTER — Ambulatory Visit (HOSPITAL_COMMUNITY): Payer: Medicare Other | Admitting: Anesthesiology

## 2023-06-16 ENCOUNTER — Ambulatory Visit (HOSPITAL_COMMUNITY)
Admission: RE | Admit: 2023-06-16 | Discharge: 2023-06-16 | Disposition: A | Discharge status: Home / Self Care | Payer: Medicare Other | Attending: Internal Medicine | Admitting: Internal Medicine

## 2023-06-16 ENCOUNTER — Encounter (HOSPITAL_COMMUNITY): Payer: Self-pay | Admitting: Internal Medicine

## 2023-06-16 DIAGNOSIS — K219 Gastro-esophageal reflux disease without esophagitis: Secondary | ICD-10-CM | POA: Diagnosis not present

## 2023-06-16 DIAGNOSIS — I10 Essential (primary) hypertension: Secondary | ICD-10-CM

## 2023-06-16 DIAGNOSIS — R6881 Early satiety: Secondary | ICD-10-CM | POA: Diagnosis not present

## 2023-06-16 DIAGNOSIS — I4891 Unspecified atrial fibrillation: Secondary | ICD-10-CM | POA: Diagnosis not present

## 2023-06-16 DIAGNOSIS — Z7984 Long term (current) use of oral hypoglycemic drugs: Secondary | ICD-10-CM | POA: Diagnosis not present

## 2023-06-16 DIAGNOSIS — E7800 Pure hypercholesterolemia, unspecified: Secondary | ICD-10-CM | POA: Diagnosis not present

## 2023-06-16 DIAGNOSIS — E119 Type 2 diabetes mellitus without complications: Secondary | ICD-10-CM | POA: Diagnosis not present

## 2023-06-16 DIAGNOSIS — R1310 Dysphagia, unspecified: Secondary | ICD-10-CM | POA: Diagnosis not present

## 2023-06-16 DIAGNOSIS — Z79899 Other long term (current) drug therapy: Secondary | ICD-10-CM | POA: Diagnosis not present

## 2023-06-16 DIAGNOSIS — Z6827 Body mass index (BMI) 27.0-27.9, adult: Secondary | ICD-10-CM | POA: Diagnosis not present

## 2023-06-16 DIAGNOSIS — F32A Depression, unspecified: Secondary | ICD-10-CM | POA: Insufficient documentation

## 2023-06-16 DIAGNOSIS — F17210 Nicotine dependence, cigarettes, uncomplicated: Secondary | ICD-10-CM | POA: Insufficient documentation

## 2023-06-16 DIAGNOSIS — Z85118 Personal history of other malignant neoplasm of bronchus and lung: Secondary | ICD-10-CM | POA: Diagnosis not present

## 2023-06-16 DIAGNOSIS — K449 Diaphragmatic hernia without obstruction or gangrene: Secondary | ICD-10-CM | POA: Diagnosis not present

## 2023-06-16 DIAGNOSIS — Z8572 Personal history of non-Hodgkin lymphomas: Secondary | ICD-10-CM | POA: Insufficient documentation

## 2023-06-16 HISTORY — PX: MALONEY DILATION: SHX5535

## 2023-06-16 HISTORY — PX: ESOPHAGOGASTRODUODENOSCOPY (EGD) WITH PROPOFOL: SHX5813

## 2023-06-16 LAB — GLUCOSE, CAPILLARY: Glucose-Capillary: 138 mg/dL — ABNORMAL HIGH (ref 70–99)

## 2023-06-16 SURGERY — ESOPHAGOGASTRODUODENOSCOPY (EGD) WITH PROPOFOL
Anesthesia: General

## 2023-06-16 MED ORDER — PHENYLEPHRINE 80 MCG/ML (10ML) SYRINGE FOR IV PUSH (FOR BLOOD PRESSURE SUPPORT)
PREFILLED_SYRINGE | INTRAVENOUS | Status: DC | PRN
Start: 2023-06-16 — End: 2023-06-16
  Administered 2023-06-16: 160 ug via INTRAVENOUS

## 2023-06-16 MED ORDER — STERILE WATER FOR IRRIGATION IR SOLN
Status: DC | PRN
  Administered 2023-06-16: 100 mL

## 2023-06-16 MED ORDER — PROPOFOL 500 MG/50ML IV EMUL
INTRAVENOUS | Status: DC | PRN
  Administered 2023-06-16: 150 ug/kg/min via INTRAVENOUS

## 2023-06-16 MED ORDER — LACTATED RINGERS IV SOLN: INTRAVENOUS | Status: DC

## 2023-06-16 MED ORDER — LIDOCAINE HCL (CARDIAC) PF 100 MG/5ML IV SOSY
PREFILLED_SYRINGE | INTRAVENOUS | Status: DC | PRN
  Administered 2023-06-16: 50 mg via INTRAVENOUS

## 2023-06-16 MED ORDER — PROPOFOL 10 MG/ML IV BOLUS
INTRAVENOUS | Status: DC | PRN
Start: 2023-06-16 — End: 2023-06-16
  Administered 2023-06-16: 100 mg via INTRAVENOUS

## 2023-06-16 NOTE — Interval H&P Note (Signed)
History and Physical Interval Note:  06/16/2023 11:04 AM  Monica [name]  has presented today for surgery, with the diagnosis of dysphagia,early satiety.  The various methods of treatment have been discussed with the patient and family. After consideration of risks, benefits and other options for treatment, the patient has consented to  Procedure(s) with comments: ESOPHAGOGASTRODUODENOSCOPY (EGD) WITH PROPOFOL (N/A) - 1:30 pm, asa 3 MALONEY DILATION (N/A) as a surgical intervention.  The patient's history has been reviewed, patient examined, no change in status, stable for surgery.  I have reviewed the patient's chart and labs.  Questions were answered to the patient's satisfaction.          No change.  EGD diagnostic possible dilation. The risks, benefits, limitations, alternatives and imponderables have been reviewed with the patient. Potential for esophageal dilation, biopsy, etc. have also been reviewed.  Questions have been answered. All parties agreeable.

## 2023-06-16 NOTE — Transfer of Care (Signed)
Immediate Anesthesia Transfer of Care Note  Patient: Monica [name]  Procedure(s) Performed: ESOPHAGOGASTRODUODENOSCOPY (EGD) WITH PROPOFOL MALONEY DILATION  Patient Location: Short Stay  Anesthesia Type:General  Level of Consciousness: drowsy  Airway & Oxygen Therapy: Patient Spontanous Breathing  Post-op Assessment: Report given to RN and Post -op Vital signs reviewed and stable  Post vital signs: Reviewed and stable  Last Vitals:  Vitals Value Taken Time  BP    Temp    Pulse    Resp    SpO2      Last Pain:  Vitals:   06/16/23 1113  TempSrc:   PainSc: 0-No pain         Complications: No notable events documented.

## 2023-06-16 NOTE — Discharge Instructions (Addendum)
EGD Discharge instructions Please read the instructions outlined below and refer to this sheet in the next few weeks. These discharge instructions provide you with general information on caring for yourself after you leave the hospital. Your doctor may also give you specific instructions. While your treatment has been planned according to the most current medical practices available, unavoidable complications occasionally occur. If you have any problems or questions after discharge, please call your doctor. ACTIVITY You may resume your regular activity but move at a slower pace for the next 24 hours.  Take frequent rest periods for the next 24 hours.  Walking will help expel (get rid of) the air and reduce the bloated feeling in your abdomen.  No driving for 24 hours (because of the anesthesia (medicine) used during the test).  You may shower.  Do not sign any important legal documents or operate any machinery for 24 hours (because of the anesthesia used during the test).  NUTRITION Drink plenty of fluids.  You may resume your normal diet.  Begin with a light meal and progress to your normal diet.  Avoid alcoholic beverages for 24 hours or as instructed by your caregiver.  MEDICATIONS You may resume your normal medications unless your caregiver tells you otherwise.  WHAT YOU CAN EXPECT TODAY You may experience abdominal discomfort such as a feeling of fullness or "gas" pains.  FOLLOW-UP Your doctor will discuss the results of your test with you.  SEEK IMMEDIATE MEDICAL ATTENTION IF ANY OF THE FOLLOWING OCCUR: Excessive nausea (feeling sick to your stomach) and/or vomiting.  Severe abdominal pain and distention (swelling).  Trouble swallowing.  Temperature over 101 F (37.8 C).  Rectal bleeding or vomiting of blood.       You again were found to have a very large hiatal hernia.  Otherwise, your exam was unremarkable.  Your esophagus was dilated.  GERD information provided  Increase  Protonix to 40 mg twice daily (best taken 30 minutes before breakfast and supper).  A new prescription is already been sent to your pharmacy from my office  Office visit with [place] in 3 months.  At patient request I called Conrad Oldham at [phone] -  reviewed findings and recommendations    resume Eliquis today.

## 2023-06-16 NOTE — Anesthesia Preprocedure Evaluation (Signed)
Anesthesia Evaluation  Patient identified by MRN, date of birth, ID band Patient awake    Reviewed: Allergy & Precautions, H&P , NPO status , Patient's Chart, lab work & pertinent test results, reviewed documented beta blocker date and time   Airway Mallampati: II  TM Distance: >3 FB Neck ROM: Full    Dental  (+) Dental Advisory Given, Upper Dentures, Missing   Pulmonary shortness of breath and with exertion, Current Smoker and Patient abstained from smoking. Bilateral lung cancer, lobectomies     Pulmonary exam normal breath sounds clear to auscultation       Cardiovascular Exercise Tolerance: Poor hypertension, Pt. on medications and Pt. on home beta blockers + DOE  + dysrhythmias Atrial Fibrillation  Rhythm:Irregular Rate:Normal     Neuro/Psych  PSYCHIATRIC DISORDERS  Depression     Neuromuscular disease    GI/Hepatic Neg liver ROS, hiatal hernia,GERD  Medicated and Controlled,,  Endo/Other  diabetes, Well Controlled, Type 2, Oral Hypoglycemic Agents    Renal/GU negative Renal ROS  negative genitourinary   Musculoskeletal negative musculoskeletal ROS (+)    Abdominal   Peds negative pediatric ROS (+)  Hematology negative hematology ROS (+)   Anesthesia Other Findings   Reproductive/Obstetrics negative OB ROS                             Anesthesia Physical Anesthesia Plan  ASA: 3  Anesthesia Plan: General   Post-op Pain Management: Minimal or no pain anticipated   Induction: Intravenous  PONV Risk Score and Plan: Propofol infusion  Airway Management Planned: Nasal Cannula and Natural Airway  Additional Equipment:   Intra-op Plan:   Post-operative Plan:   Informed Consent: I have reviewed the patients History and Physical, chart, labs and discussed the procedure including the risks, benefits and alternatives for the proposed anesthesia with the patient or authorized  representative who has indicated his/her understanding and acceptance.     Dental advisory given  Plan Discussed with: CRNA and Surgeon  Anesthesia Plan Comments:         Anesthesia Quick Evaluation

## 2023-06-16 NOTE — Anesthesia Procedure Notes (Signed)
Date/Time: 06/16/2023 11:18 AM  Performed by: Julian Reil, CRNAPre-anesthesia Checklist: Patient identified, Emergency Drugs available, Suction available and Patient being monitored Patient Re-evaluated:Patient Re-evaluated prior to induction Oxygen Delivery Method: Nasal cannula Induction Type: IV induction Placement Confirmation: positive ETCO2

## 2023-06-16 NOTE — Anesthesia Postprocedure Evaluation (Signed)
Anesthesia Post Note  Patient: Monica [name]  Procedure(s) Performed: ESOPHAGOGASTRODUODENOSCOPY (EGD) WITH PROPOFOL MALONEY DILATION  Patient location during evaluation: Phase II Anesthesia Type: General Level of consciousness: awake and alert and oriented Pain management: pain level controlled Vital Signs Assessment: post-procedure vital signs reviewed and stable Respiratory status: spontaneous breathing, nonlabored ventilation and respiratory function stable Cardiovascular status: blood pressure returned to baseline and stable Postop Assessment: no apparent nausea or vomiting Anesthetic complications: no  No notable events documented.   Last Vitals:  Vitals:   06/16/23 0910 06/16/23 1126  BP: (!) 159/83 132/68  Pulse: 84 65  Resp: 16 17  Temp: 36.6 C 36.6 C  SpO2: 98% 97%    Last Pain:  Vitals:   06/16/23 1126  TempSrc: Oral  PainSc: 0-No pain                  C 

## 2023-06-16 NOTE — Op Note (Signed)
[facility] Patient Name: Monica [name] Procedure Date: 06/16/2023 10:38 AM MRN: [id number] Date of Birth: 01/14/1941 Attending MD: Gennette Pac , MD, [id number] CSN: [id number] Age: 82 Admit Type: Outpatient Procedure:                Upper GI endoscopy Indications:              Dysphagia Providers:                Gennette Pac, MD, Tammy Vaught, RN,                            Judeth Cornfield. Jessee Avers, Technician Referring MD:              Medicines:                Propofol per Anesthesia Complications:            No immediate complications. Estimated Blood Loss:     Estimated blood loss: none. Procedure:                After obtaining informed consent, the endoscope was                            passed under direct vision. Throughout the                            procedure, the patient's blood pressure, pulse, and                            oxygen saturations were monitored continuously. The                            GIF-H190 ([id number]) scope was introduced through the                            mouth, and advanced to the second part of duodenum.                            The upper GI endoscopy was accomplished without                            difficulty. The patient tolerated the procedure                            well. Scope In: 11:16:14 AM Scope Out: 11:21:45 AM Total Procedure Duration: 0 hours 5 minutes 31 seconds  Findings:      A large hiatal hernia was present.      The examined esophagus was normal. Hernia sac with minimal amount of       retained gastric contents easily washed away. Tubular esophagus did       appear entirely normal. Based on diaphragmatic excursions two thirds of       the stomach is above the diaphragm. The gastric mucosa appeared normal.       No evidence of infiltrating process or other abnormality. Pylorus patent.      The duodenal bulb and second portion of the duodenum were normal. Scope  was withdrawn and a 56  [place] Maloney dilator was passed to full       insertion. No resistance. A look back revealed no complications related       to this maneuver. Impression:               - Large hiatal hernia. (Two thirds of the stomach                            above the diaphragm). normal-appearing residual                            gastric mucosa                           - Normal esophagus. status post [facility] dilation                           - Normal duodenal bulb and second portion of the                            duodenum.                           - No specimens collected. I suspect patient has                            stasis in her large hernia sac. Likely contributing                            to nausea and reflux symptoms Moderate Sedation:      Moderate (conscious) sedation was personally administered by an       anesthesia professional. The following parameters were monitored: oxygen       saturation, heart rate, blood pressure, respiratory rate, EKG, adequacy       of pulmonary ventilation, and response to care. Recommendation:           - Patient has a contact number available for                            emergencies. The signs and symptoms of potential                            delayed complications were discussed with the                            patient. Return to normal activities tomorrow.                            Written discharge instructions were provided to the                            patient.                           - Advance diet as tolerated. GERD information  provided. Increase Protonix to 40 mg twice daily?"30                            minutes before breakfast and supper. Office visit                            with [place] in 2 months. Procedure Code(s):        --- Professional ---                           [phone], Esophagogastroduodenoscopy, flexible,                            transoral; diagnostic, including collection of                             specimen(s) by brushing or washing, when performed                            (separate procedure) Diagnosis Code(s):        --- Professional ---                           K44.9, Diaphragmatic hernia without obstruction or                            gangrene                           R13.10, Dysphagia, unspecified CPT copyright 2022 American Medical Association. All rights reserved. The codes documented in this report are preliminary and upon coder review may  be revised to meet current compliance requirements. Gerrit Friends. , MD Gennette Pac, MD 06/16/2023 11:26:43 AM This report has been signed electronically. Number of Addenda: 0

## 2023-06-21 ENCOUNTER — Encounter (HOSPITAL_COMMUNITY): Payer: Self-pay | Admitting: Internal Medicine

## 2023-06-24 ENCOUNTER — Ambulatory Visit (HOSPITAL_COMMUNITY)
Admission: RE | Admit: 2023-06-24 | Discharge: 2023-06-24 | Disposition: A | Discharge status: Home / Self Care | Payer: Medicare Other | Source: Ambulatory Visit | Attending: Hematology | Admitting: Hematology

## 2023-06-24 DIAGNOSIS — S22000A Wedge compression fracture of unspecified thoracic vertebra, initial encounter for closed fracture: Secondary | ICD-10-CM

## 2023-06-24 DIAGNOSIS — M810 Age-related osteoporosis without current pathological fracture: Secondary | ICD-10-CM | POA: Diagnosis not present

## 2023-07-05 DIAGNOSIS — N3020 Other chronic cystitis without hematuria: Secondary | ICD-10-CM | POA: Diagnosis not present

## 2023-07-17 ENCOUNTER — Other Ambulatory Visit: Payer: Self-pay | Admitting: Cardiology

## 2023-07-20 DIAGNOSIS — E7849 Other hyperlipidemia: Secondary | ICD-10-CM | POA: Diagnosis not present

## 2023-07-20 DIAGNOSIS — E1122 Type 2 diabetes mellitus with diabetic chronic kidney disease: Secondary | ICD-10-CM | POA: Diagnosis not present

## 2023-07-20 DIAGNOSIS — N183 Chronic kidney disease, stage 3 unspecified: Secondary | ICD-10-CM | POA: Diagnosis not present

## 2023-07-20 DIAGNOSIS — K2100 Gastro-esophageal reflux disease with esophagitis, without bleeding: Secondary | ICD-10-CM | POA: Diagnosis not present

## 2023-07-27 DIAGNOSIS — M25472 Effusion, left ankle: Secondary | ICD-10-CM | POA: Diagnosis not present

## 2023-07-27 DIAGNOSIS — Z8679 Personal history of other diseases of the circulatory system: Secondary | ICD-10-CM | POA: Diagnosis not present

## 2023-07-27 DIAGNOSIS — Z23 Encounter for immunization: Secondary | ICD-10-CM | POA: Diagnosis not present

## 2023-07-27 DIAGNOSIS — R5383 Other fatigue: Secondary | ICD-10-CM | POA: Diagnosis not present

## 2023-07-27 DIAGNOSIS — I10 Essential (primary) hypertension: Secondary | ICD-10-CM | POA: Diagnosis not present

## 2023-07-27 DIAGNOSIS — C884 Extranodal marginal zone B-cell lymphoma of mucosa-associated lymphoid tissue [MALT-lymphoma]: Secondary | ICD-10-CM | POA: Diagnosis not present

## 2023-07-27 DIAGNOSIS — C3490 Malignant neoplasm of unspecified part of unspecified bronchus or lung: Secondary | ICD-10-CM | POA: Diagnosis not present

## 2023-07-27 DIAGNOSIS — E1165 Type 2 diabetes mellitus with hyperglycemia: Secondary | ICD-10-CM | POA: Diagnosis not present

## 2023-07-27 DIAGNOSIS — M791 Myalgia, unspecified site: Secondary | ICD-10-CM | POA: Diagnosis not present

## 2023-07-27 DIAGNOSIS — R233 Spontaneous ecchymoses: Secondary | ICD-10-CM | POA: Diagnosis not present

## 2023-07-27 DIAGNOSIS — I700 Atherosclerosis of aorta: Secondary | ICD-10-CM | POA: Diagnosis not present

## 2023-07-27 DIAGNOSIS — E782 Mixed hyperlipidemia: Secondary | ICD-10-CM | POA: Diagnosis not present

## 2023-07-27 DIAGNOSIS — K449 Diaphragmatic hernia without obstruction or gangrene: Secondary | ICD-10-CM | POA: Diagnosis not present

## 2023-08-15 ENCOUNTER — Encounter: Payer: Self-pay | Admitting: Internal Medicine

## 2023-09-06 ENCOUNTER — Ambulatory Visit: Payer: Medicare Other | Admitting: Internal Medicine

## 2023-09-06 ENCOUNTER — Encounter: Payer: Self-pay | Admitting: Internal Medicine

## 2023-09-06 VITALS — BP 132/73 | HR 96 | Temp 97.7°F | Ht 66.0 in | Wt 174.6 lb

## 2023-09-06 DIAGNOSIS — C884 Extranodal marginal zone b-cell lymphoma of mucosa-associated lymphoid tissue (malt-lymphoma) not having achieved remission: Secondary | ICD-10-CM

## 2023-09-06 DIAGNOSIS — K219 Gastro-esophageal reflux disease without esophagitis: Secondary | ICD-10-CM | POA: Diagnosis not present

## 2023-09-06 DIAGNOSIS — K449 Diaphragmatic hernia without obstruction or gangrene: Secondary | ICD-10-CM | POA: Diagnosis not present

## 2023-09-06 DIAGNOSIS — R110 Nausea: Secondary | ICD-10-CM | POA: Diagnosis not present

## 2023-09-06 DIAGNOSIS — R1310 Dysphagia, unspecified: Secondary | ICD-10-CM

## 2023-09-06 DIAGNOSIS — K222 Esophageal obstruction: Secondary | ICD-10-CM

## 2023-09-06 NOTE — Progress Notes (Signed)
Primary Care Physician:  Estanislado Pandy, MD Primary Gastroenterologist:  Dr. Jena Gauss  Pre-Procedure History & Physical: HPI:  Monica [name] is a 82 y.o. female here for follow-up of GERD dysphagia.  Recent EGD demonstrated a large hiatal, Schatzki's ring dilated.  Dysphagia resolved.   Recommended twice daily Protonix; patient dropped back to once daily with occasional use of Tums.  Doing well.  Gets nauseated if she eats too much.  Bowel function is good.  Vague pain abdominal cramps with bowel function but only at those times she does not strain. History of MALT lymphoma previously treated.  No evidence of residual lymphoma on most recent EGD. Mall adenoma removed from her colon 4 years ago; no future colonoscopies recommended.  History of squamous cell carcinoma of the lung followed closely by Dr. Ellin Saba.   Past Medical History:  Diagnosis Date   Cancer (HCC)    NHL, Malt Lymphoma   Depression    Diverticulitis 04/28/2020   DM (diabetes mellitus) (HCC)    TYPE  2   Dysphagia    Dysrhythmia    History of palpatations   GERD (gastroesophageal reflux disease)    INGESTION    Heart palpitations    Hiatal hernia    Hyperlipidemia    Interstitial cystitis    Iron deficiency 04/28/2020   Lung cancer, lower lobe (HCC) 07/21/2018   Left lower lobe, stage Ia squamous cell carcinoma   MALT (mucosa associated lymphoid tissue)    gastric   Sinusitis     Past Surgical History:  Procedure Laterality Date   ABDOMINAL HYSTERECTOMY     BIOPSY  09/01/2016   Procedure: BIOPSY;  Surgeon: Corbin Ade, MD;  [facility];  Service: Endoscopy;;  gastric   BIOPSY  11/29/2018   Procedure: BIOPSY;  Surgeon: Corbin Ade, MD;  [facility];  Service: Endoscopy;;  right colon   BLADDER SURGERY     CATARACT EXTRACTION W/ INTRAOCULAR LENS  IMPLANT, BILATERAL  2015   CATARACT EXTRACTION, BILATERAL     COLONOSCOPY     Dr. Cleotis Nipper 2009: Normal per PCP notes    COLONOSCOPY N/A 11/29/2018   Bassel Gaskill: Diverticulosis, 3 polyps removed.  No evidence of colitis.  Tubular adenomas.  No future surveillance colonoscopies recommended due to age.   ESOPHAGOGASTRODUODENOSCOPY     RMR: Prominant Schzgzki ring/component of peptic stricture status post dilation and disruption as described above, otherwise norma esophagus, moderate-sized hiatal hernia, antal pyloric channel, and posterier bulbar erosions, otherwise unremarkable stomach, D1 and D2 . Inflammatory findings on the stomach and duodenum will likely be related to aspirin effect. We need to rule out Helicobacter pylor   ESOPHAGOGASTRODUODENOSCOPY N/A 03/10/2015   Dr. 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 Pathology with lymphoid population of stomach, slight atypia   ESOPHAGOGASTRODUODENOSCOPY N/A 02/03/2016   Dr. Jena Gauss: Schatzki ring noted at GE junction, dilated with 56 and then 58 French Maloney dilator. Large hiatal hernia. 6 x 7 cm nodular geographically ulcerated mucosa, biopsy c/w MALToma   ESOPHAGOGASTRODUODENOSCOPY N/A 04/08/2016   Dr. Jena Gauss: moderate Schatzki's ring/web s/p dilation, large hiatal hernia, localized area of gastric lymphoma visualized and appeared to be much improved. normal second portion of the duodenum. No specimens collected. Esophageal lumen notably tighted up significantly since her dilation in April of this year.    ESOPHAGOGASTRODUODENOSCOPY N/A 08/04/2016   Procedure: ESOPHAGOGASTRODUODENOSCOPY (EGD);  Surgeon: Corbin Ade, MD;  [facility];  Service: Endoscopy;  Laterality: N/A;  7:30 am   ESOPHAGOGASTRODUODENOSCOPY N/A 09/01/2016   Procedure: ESOPHAGOGASTRODUODENOSCOPY (EGD);  Surgeon: Corbin Ade, MD;  [facility];  Service: Endoscopy;  Laterality: N/A;  830    ESOPHAGOGASTRODUODENOSCOPY N/A 05/10/2017   Dr. Jena Gauss: Moderate Schatzki ring at the GE junction, status post dilation with 54  [place].  Medium sized hiatal hernia.  Few localized erosions in the gastric antrum.  Stomach biopsy showed chronic gastritis, no H. pylori.  No atypical lymphoid infiltrates or other features of lymphoproliferative process   ESOPHAGOGASTRODUODENOSCOPY N/A 06/13/2018   Dr. Jena Gauss: Schatzki ring status post dilation.  Large hiatal hernia.  Focal area 4 x 4 cm along the greater curvature, somewhat erythematous and thickened mucosa, biopsy benign.  Next EGD in August 2020.   ESOPHAGOGASTRODUODENOSCOPY N/A 01/14/2021   Procedure: ESOPHAGOGASTRODUODENOSCOPY (EGD);  Surgeon: Corbin Ade, MD;  [facility];  Service: Endoscopy;  Laterality: N/A;  AM   ESOPHAGOGASTRODUODENOSCOPY (EGD) WITH PROPOFOL N/A 06/16/2023   Procedure: ESOPHAGOGASTRODUODENOSCOPY (EGD) WITH PROPOFOL;  Surgeon: Corbin Ade, MD;  [facility];  Service: Endoscopy;  Laterality: N/A;  1:30 pm, asa 3   EYE SURGERY     INTERCOSTAL NERVE BLOCK Right 09/10/2019   Procedure: Intercostal Nerve Block;  Surgeon: Loreli Slot, MD;  Location: [facility] OR;  Service: Thoracic;  Laterality: Right;   LOBECTOMY Left 05/19/2018   Procedure: LEFT LOWER LOBECTOMY;  Surgeon: Loreli Slot, MD;  Location: [facility] OR;  Service: Thoracic;  Laterality: Left;   LYMPH NODE DISSECTION Right 09/10/2019   Procedure: Lymph Node Dissection;  Surgeon: Loreli Slot, MD;  Location: [facility] OR;  Service: Thoracic;  Laterality: Right;   MALONEY DILATION N/A 03/10/2015   Procedure: Alvy Beal;  Surgeon: Corbin Ade, MD;  [facility];  Service: Endoscopy;  Laterality: N/A;   MALONEY DILATION N/A 02/03/2016   Procedure: [name] DILATION;  Surgeon: Corbin Ade, MD;  [facility];  Service: Endoscopy;  Laterality: N/A;   MALONEY DILATION N/A 04/08/2016   Procedure: [name] DILATION;  Surgeon: Corbin Ade, MD;  [facility];  Service: Endoscopy;  Laterality: N/A;   MALONEY DILATION N/A 05/10/2017    Procedure: [name] DILATION;  Surgeon: Corbin Ade, MD;  [facility];  Service: Endoscopy;  Laterality: N/A;   MALONEY DILATION N/A 06/13/2018   Procedure: [name] DILATION;  Surgeon: Corbin Ade, MD;  [facility];  Service: Endoscopy;  Laterality: N/A;   MALONEY DILATION N/A 01/14/2021   Procedure: [name] DILATION;  Surgeon: Corbin Ade, MD;  [facility];  Service: Endoscopy;  Laterality: N/A;   MALONEY DILATION N/A 06/16/2023   Procedure: [name] DILATION;  Surgeon: Corbin Ade, MD;  [facility];  Service: Endoscopy;  Laterality: N/A;   POLYPECTOMY  11/29/2018   Procedure: POLYPECTOMY;  Surgeon: Corbin Ade, MD;  [facility];  Service: Endoscopy;;   VIDEO ASSISTED THORACOSCOPY (VATS)/ LOBECTOMY Right 09/10/2019   Procedure: VIDEO ASSISTED THORACOSCOPY (VATS)/RIGHT LOWER LOBECTOMY;  Surgeon: Loreli Slot, MD;  Location: [facility] OR;  Service: Thoracic;  Laterality: Right;   VIDEO ASSISTED THORACOSCOPY (VATS)/WEDGE RESECTION Left 05/19/2018   Procedure: VIDEO ASSISTED THORACOSCOPY (VATS)/WEDGE RESECTION;  Surgeon: Loreli Slot, MD;  Location: [facility] OR;  Service: Thoracic;  Laterality: Left;    Prior to Admission medications   Medication Sig Start Date End Date Taking? Authorizing  Provider  albuterol (VENTOLIN HFA) 108 (90 Base) MCG/ACT inhaler Inhale 2 puffs into the lungs 4 (four) times daily. 08/13/23  Yes [provider]  amLODipine (NORVASC) 5 MG tablet TAKE 1 TABLET BY MOUTH DAILY 07/18/23  Yes Branch, Dorothe Pea, MD  apixaban (ELIQUIS) 5 MG TABS tablet TAKE 1 TABLET BY MOUTH TWICE  DAILY 04/07/23  Yes Branch, Dorothe Pea, MD  cholecalciferol (VITAMIN D3) 25 MCG (1000 UNIT) tablet Take 1,000 Units by mouth daily.   Yes [provider]  Cranberry 125 MG TABS Take by mouth.   Yes [provider]  ezetimibe (ZETIA) 10 MG tablet Take 10 mg by mouth daily. 03/03/23  Yes [provider]  ferrous sulfate 325 (65 FE) MG tablet Take 325 mg by mouth daily with breakfast.   Yes [provider]  losartan (COZAAR) 50 MG tablet Take 50 mg by mouth daily.   Yes [provider]  metoprolol tartrate (LOPRESSOR) 25 MG tablet TAKE 1 TABLET BY MOUTH TWICE  DAILY 07/18/23  Yes Branch, Dorothe Pea, MD  Multiple Vitamin (MULTIVITAMIN WITH MINERALS) TABS tablet Take 1 tablet by mouth daily.   Yes [provider]  pantoprazole (PROTONIX) 40 MG tablet Take 1 tablet (40 mg total) by mouth daily. 01/14/21  Yes Cadel Stairs, Gerrit Friends, MD  pioglitazone (ACTOS) 15 MG tablet Take 15 mg by mouth daily.   Yes [provider]  nitroGLYCERIN (NITROSTAT) 0.4 MG SL tablet Place 1 tablet (0.4 mg total) under the tongue every 5 (five) minutes x 3 doses as needed for chest pain (If no relief after 3rd dose, GO TO ED). Patient not taking: Reported on 09/06/2023 02/08/23 09/06/23  Sharlene Dory, NP    Allergies as of 09/06/2023 - Review Complete 09/06/2023  Allergen Reaction Noted   Elemental sulfur Hives 06/04/2016    Family History  Problem Relation Age of Onset   Heart attack Father    Dementia Father    Diabetes Sister    Diabetes Brother    Diabetes Sister    Colon cancer Neg Hx     Social History   Socioeconomic History   Marital status: Married    Spouse name: Not on file   Number of children: Not on file   Years of education: Not on file   Highest education level: Not on file  Occupational History   Not on file  Tobacco Use   Smoking status: Some Days    Current packs/day: 0.00    Average packs/day: 0.5 packs/day for 57.0 years (28.5 ttl pk-yrs)    Types: Cigarettes    Start date: 08/01/1965    Last attempt to quit: 08/01/2022    Years since quitting: 1.0    Passive exposure: Never   Smokeless tobacco: Never  Vaping Use   Vaping status: Never Used  Substance and Sexual Activity   Alcohol use: No    Alcohol/week: 0.0 standard drinks of alcohol   Drug  use: No   Sexual activity: Not Currently  Other Topics Concern   Not on file  Social History Narrative   Not on file   Social Determinants of Health   Financial Resource Strain: Not on file  Food Insecurity: Not on file  Transportation Needs: Not on file  Physical Activity: Not on file  Stress: Not on file  Social Connections: Not on file  Intimate Partner Violence: Not on file    Review of Systems: See HPI, otherwise negative ROS  Physical Exam: BP 132/73 (  BP Location: Left Arm, Patient Position: Sitting, Cuff Size: Normal)   Pulse 96   Temp 97.7 F (36.5 C) (Oral)   Ht 5\' 6"  (1.676 m)   Wt 174 lb 9.6 oz (79.2 kg)   SpO2 99%   BMI 28.18 kg/m  General:   Alert,  Well-developed, well-nourished, pleasant and cooperative in NAD Neck:  Supple; no masses or thyromegaly. No significant cervical adenopathy. Lungs:  Clear throughout to auscultation.   No wheezes, crackles, or rhonchi. No acute distress. Heart:  Regular rate and rhythm; no murmurs, clicks, rubs,  or gallops. Abdomen: Non-distended, normal bowel sounds.  Soft and nontender without appreciable mass or hepatosplenomegaly.   Impression/Plan: 82 year old lady with a history of GERD large hiatal hernia MALT lymphoma clinically, doing well.  Dysphagia improved status post dilation of a Schatzki's ring nausea when she overeats in the setting of a large hiatal hernia no evidence of recurrent lymphoma on recent EGD overall she is doing fairly well.  Reflux related to hiatal hernia is the major clinical GI issue at this time.  Recommendations:  take Protonix 40 mg daily 30 minutes before breakfast indefinitely.  May take a second dose 30 minutes before supper as needed  Occasional use of Tums is okay  When you are eating a meal and start to feel full simply stop.  This will give your stomach a chance to empty better in the setting of a large hiatal hernia.  I do not specifically recommend another upper endoscopy unless new  symptoms arise or requested by Dr. Ellin Saba  Unless something comes up, we will plan to see you back in 1 year.    Notice: This dictation was prepared with Dragon dictation along with smaller phrase technology. Any transcriptional errors that result from this process are unintentional and may not be corrected upon review.

## 2023-09-06 NOTE — Patient Instructions (Signed)
It was good to see you again today!  As discussed, I recommend you take Protonix 40 mg daily 30 minutes before breakfast indefinitely  May take a second dose 30 minutes before supper as needed  Occasional use of Tums is okay  When you are eating a meal and start to feel full simply stop.  This will give your stomach a chance to empty better in the setting of a large hiatal hernia.  I do not specifically recommend another upper endoscopy unless new symptoms arise or requested by Dr. Ellin Saba  Unless something comes up, we will plan to see you back in 1 year.

## 2023-10-12 ENCOUNTER — Encounter: Payer: Self-pay | Admitting: Cardiology

## 2023-10-12 ENCOUNTER — Ambulatory Visit: Payer: Medicare Other | Attending: Cardiology | Admitting: Cardiology

## 2023-10-12 VITALS — BP 124/78 | HR 93 | Ht 66.0 in | Wt 172.8 lb

## 2023-10-12 DIAGNOSIS — I2510 Atherosclerotic heart disease of native coronary artery without angina pectoris: Secondary | ICD-10-CM

## 2023-10-12 DIAGNOSIS — I480 Paroxysmal atrial fibrillation: Secondary | ICD-10-CM | POA: Diagnosis not present

## 2023-10-12 DIAGNOSIS — I272 Pulmonary hypertension, unspecified: Secondary | ICD-10-CM | POA: Diagnosis not present

## 2023-10-12 DIAGNOSIS — D6869 Other thrombophilia: Secondary | ICD-10-CM | POA: Diagnosis not present

## 2023-10-12 DIAGNOSIS — E782 Mixed hyperlipidemia: Secondary | ICD-10-CM

## 2023-10-12 DIAGNOSIS — I10 Essential (primary) hypertension: Secondary | ICD-10-CM | POA: Diagnosis not present

## 2023-10-12 MED ORDER — METOPROLOL TARTRATE 37.5 MG PO TABS: 37.5000 mg | ORAL_TABLET | Freq: Two times a day (BID) | ORAL | 1 refills | Status: DC

## 2023-10-12 NOTE — Patient Instructions (Signed)
Medication Instructions:   Increase Lopressor to 37.5mg  twice a day   Continue all other medications.     Labwork:  none  Testing/Procedures:  none  Follow-Up:  6 months   Any Other Special Instructions Will Be Listed Below (If Applicable).   If you need a refill on your cardiac medications before your next appointment, please call your pharmacy.

## 2023-10-12 NOTE — Progress Notes (Signed)
Clinical Summary Monica [name] is a 82 y.o.female seen today for follow up of the following medical problems.    1. PAF - has not tolerated amio in the past    - increased palpitatons. Occurring daily, typically lasts just a few minutes.  - no bleeding on eliquis     2. History of lung cancer - followed by oncology, listed as stage I squamous cell - lobectomy in 2019 left side and right side 2020    3. Hyperlipidemia - muscle aches, pcp stopped statin. Most recently was on pravastatin. Symptoms resolved off statin - she is on zetia 10mg  daily now - 07/2022 TC 146 HDL 62 TG 117 LDL 60 - reports labs in Jan coming up with pcp   4. HTN - often clinic bp's higher than home bp's - compliant with meds    5. Enlarged pulmonary artery/pulmonic trunk - noted on 06/2021 CT scan, also with signs of emphysema - 2019 echo could not estiamted PASP, normal RV. She did have grade II dd, LVEF 65-70%   08/2019 PFTs: moderate obstruction, moderate diffusion defect 08/2021 echo LVEF 70-75%, grade II dd, normal RV, PASP 38, mild MR   - no recent SOB   7. Coronary atherosclerosis - noted on CT scan for lung cancer screening - no recent chest pains.  Past Medical History:  Diagnosis Date   Cancer (HCC)    NHL, Malt Lymphoma   Depression    Diverticulitis 04/28/2020   DM (diabetes mellitus) (HCC)    TYPE  2   Dysphagia    Dysrhythmia    History of palpatations   GERD (gastroesophageal reflux disease)    INGESTION    Heart palpitations    Hiatal hernia    Hyperlipidemia    Interstitial cystitis    Iron deficiency 04/28/2020   Lung cancer, lower lobe (HCC) 07/21/2018   Left lower lobe, stage Ia squamous cell carcinoma   MALT (mucosa associated lymphoid tissue)    gastric   Sinusitis      Allergies  Allergen Reactions   Elemental Sulfur Hives     Current Outpatient Medications  Medication Sig Dispense Refill   albuterol (VENTOLIN HFA) 108 (90 Base) MCG/ACT inhaler Inhale  2 puffs into the lungs 4 (four) times daily.     amLODipine (NORVASC) 5 MG tablet TAKE 1 TABLET BY MOUTH DAILY 100 tablet 2   apixaban (ELIQUIS) 5 MG TABS tablet TAKE 1 TABLET BY MOUTH TWICE  DAILY 200 tablet 1   cholecalciferol (VITAMIN D3) 25 MCG (1000 UNIT) tablet Take 1,000 Units by mouth daily.     Cranberry 125 MG TABS Take by mouth.     ezetimibe (ZETIA) 10 MG tablet Take 10 mg by mouth daily.     ferrous sulfate 325 (65 FE) MG tablet Take 325 mg by mouth daily with breakfast.     losartan (COZAAR) 50 MG tablet Take 50 mg by mouth daily.     metoprolol tartrate (LOPRESSOR) 25 MG tablet TAKE 1 TABLET BY MOUTH TWICE  DAILY 200 tablet 2   Multiple Vitamin (MULTIVITAMIN WITH MINERALS) TABS tablet Take 1 tablet by mouth daily.     nitroGLYCERIN (NITROSTAT) 0.4 MG SL tablet Place 1 tablet (0.4 mg total) under the tongue every 5 (five) minutes x 3 doses as needed for chest pain (If no relief after 3rd dose, GO TO ED). (Patient not taking: Reported on 09/06/2023) 30 tablet 1   pantoprazole (PROTONIX) 40 MG tablet Take  1 tablet (40 mg total) by mouth daily. 90 tablet 3   pioglitazone (ACTOS) 15 MG tablet Take 15 mg by mouth daily.     No current facility-administered medications for this visit.     Past Surgical History:  Procedure Laterality Date   ABDOMINAL HYSTERECTOMY     BIOPSY  09/01/2016   Procedure: BIOPSY;  Surgeon: Corbin Ade, MD;  Location: [facility];  Service: Endoscopy;;  gastric   BIOPSY  11/29/2018   Procedure: BIOPSY;  Surgeon: Corbin Ade, MD;  Location: [facility];  Service: Endoscopy;;  right colon   BLADDER SURGERY     CATARACT EXTRACTION W/ INTRAOCULAR LENS  IMPLANT, BILATERAL  2015   CATARACT EXTRACTION, BILATERAL     COLONOSCOPY     Dr. Cleotis Nipper 2009: Normal per PCP notes   COLONOSCOPY N/A 11/29/2018   Rourk: Diverticulosis, 3 polyps removed.  No evidence of colitis.  Tubular adenomas.  No future surveillance colonoscopies recommended due to age.    ESOPHAGOGASTRODUODENOSCOPY     RMR: Prominant Schzgzki ring/component of peptic stricture status post dilation and disruption as described above, otherwise norma esophagus, moderate-sized hiatal hernia, antal pyloric channel, and posterier bulbar erosions, otherwise unremarkable stomach, D1 and D2 . Inflammatory findings on the stomach and duodenum will likely be related to aspirin effect. We need to rule out Helicobacter pylor   ESOPHAGOGASTRODUODENOSCOPY N/A 03/10/2015   Dr. 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 Pathology with lymphoid population of stomach, slight atypia   ESOPHAGOGASTRODUODENOSCOPY N/A 02/03/2016   Dr. Jena Gauss: Schatzki ring noted at GE junction, dilated with 56 and then 58 French Maloney dilator. Large hiatal hernia. 6 x 7 cm nodular geographically ulcerated mucosa, biopsy c/w MALToma   ESOPHAGOGASTRODUODENOSCOPY N/A 04/08/2016   Dr. Jena Gauss: moderate Schatzki's ring/web s/p dilation, large hiatal hernia, localized area of gastric lymphoma visualized and appeared to be much improved. normal second portion of the duodenum. No specimens collected. Esophageal lumen notably tighted up significantly since her dilation in April of this year.    ESOPHAGOGASTRODUODENOSCOPY N/A 08/04/2016   Procedure: ESOPHAGOGASTRODUODENOSCOPY (EGD);  Surgeon: Corbin Ade, MD;  Location: [facility];  Service: Endoscopy;  Laterality: N/A;  7:30 am   ESOPHAGOGASTRODUODENOSCOPY N/A 09/01/2016   Procedure: ESOPHAGOGASTRODUODENOSCOPY (EGD);  Surgeon: Corbin Ade, MD;  [facility]: [facility];  Service: Endoscopy;  Laterality: N/A;  830    ESOPHAGOGASTRODUODENOSCOPY N/A 05/10/2017   Dr. Jena Gauss: Moderate Schatzki ring at the GE junction, status post dilation with 54 [place].  Medium sized hiatal hernia.  Few localized erosions in the gastric antrum.  Stomach biopsy showed chronic gastritis, no H. pylori.  No atypical lymphoid infiltrates or other  features of lymphoproliferative process   ESOPHAGOGASTRODUODENOSCOPY N/A 06/13/2018   Dr. Jena Gauss: Schatzki ring status post dilation.  Large hiatal hernia.  Focal area 4 x 4 cm along the greater curvature, somewhat erythematous and thickened mucosa, biopsy benign.  Next EGD in August 2020.   ESOPHAGOGASTRODUODENOSCOPY N/A 01/14/2021   Procedure: ESOPHAGOGASTRODUODENOSCOPY (EGD);  Surgeon: Corbin Ade, MD;  [facility]: [facility];  Service: Endoscopy;  Laterality: N/A;  AM   ESOPHAGOGASTRODUODENOSCOPY (EGD) WITH PROPOFOL N/A 06/16/2023   Procedure: ESOPHAGOGASTRODUODENOSCOPY (EGD) WITH PROPOFOL;  Surgeon: Corbin Ade, MD;  [facility]: [facility];  Service: Endoscopy;  Laterality: N/A;  1:30 pm, asa 3   EYE SURGERY     INTERCOSTAL NERVE BLOCK Right 09/10/2019   Procedure: Intercostal Nerve Block;  Surgeon: Loreli Slot, MD;  Location: [facility] OR;  Service: Thoracic;  Laterality: Right;   LOBECTOMY Left 05/19/2018   Procedure: LEFT LOWER LOBECTOMY;  Surgeon: Loreli Slot, MD;  Location: [facility] OR;  Service: Thoracic;  Laterality: Left;   LYMPH NODE DISSECTION Right 09/10/2019   Procedure: Lymph Node Dissection;  Surgeon: Loreli Slot, MD;  Location: [facility] OR;  Service: Thoracic;  Laterality: Right;   MALONEY DILATION N/A 03/10/2015   Procedure: Alvy Beal;  Surgeon: Corbin Ade, MD;  [facility]: [facility];  Service: Endoscopy;  Laterality: N/A;   MALONEY DILATION N/A 02/03/2016   Procedure: [name] DILATION;  Surgeon: Corbin Ade, MD;  [facility]: [facility];  Service: Endoscopy;  Laterality: N/A;   MALONEY DILATION N/A 04/08/2016   Procedure: [name] DILATION;  Surgeon: Corbin Ade, MD;  [facility];  Service: Endoscopy;  Laterality: N/A;   MALONEY DILATION N/A 05/10/2017   Procedure: [name] DILATION;  Surgeon: Corbin Ade, MD;  [facility];  Service: Endoscopy;  Laterality: N/A;   MALONEY DILATION N/A 06/13/2018   Procedure: [name]  DILATION;  Surgeon: Corbin Ade, MD;  [facility]: [facility];  Service: Endoscopy;  Laterality: N/A;   MALONEY DILATION N/A 01/14/2021   Procedure: [name] DILATION;  Surgeon: Corbin Ade, MD;  [facility];  Service: Endoscopy;  Laterality: N/A;   MALONEY DILATION N/A 06/16/2023   Procedure: [name] DILATION;  Surgeon: Corbin Ade, MD;  [facility];  Service: Endoscopy;  Laterality: N/A;   POLYPECTOMY  11/29/2018   Procedure: POLYPECTOMY;  Surgeon: Corbin Ade, MD;  [facility];  Service: Endoscopy;;   VIDEO ASSISTED THORACOSCOPY (VATS)/ LOBECTOMY Right 09/10/2019   Procedure: VIDEO ASSISTED THORACOSCOPY (VATS)/RIGHT LOWER LOBECTOMY;  Surgeon: Loreli Slot, MD;  Location: [facility] OR;  Service: Thoracic;  Laterality: Right;   VIDEO ASSISTED THORACOSCOPY (VATS)/WEDGE RESECTION Left 05/19/2018   Procedure: VIDEO ASSISTED THORACOSCOPY (VATS)/WEDGE RESECTION;  Surgeon: Loreli Slot, MD;  Location: [facility] OR;  Service: Thoracic;  Laterality: Left;     Allergies  Allergen Reactions   Elemental Sulfur Hives      Family History  Problem Relation Age of Onset   Heart attack Father    Dementia Father    Diabetes Sister    Diabetes Brother    Diabetes Sister    Colon cancer Neg Hx      Social History Ms. Handlin reports that she has been smoking cigarettes. She started smoking about 58 years ago. She has a 28.5 pack-year smoking history. She has never been exposed to tobacco smoke. She has never used smokeless tobacco. Ms. Camera reports no history of alcohol use.   Review of Systems CONSTITUTIONAL: No weight loss, fever, chills, weakness or fatigue.  HEENT: Eyes: No visual loss, blurred vision, double vision or yellow sclerae.No hearing loss, sneezing, congestion, runny nose or sore throat.  SKIN: No rash or itching.  CARDIOVASCULAR: per hpi RESPIRATORY: No shortness of breath, cough or sputum.  GASTROINTESTINAL: No anorexia, nausea,  vomiting or diarrhea. No abdominal pain or blood.  GENITOURINARY: No burning on urination, no polyuria NEUROLOGICAL: No headache, dizziness, syncope, paralysis, ataxia, numbness or tingling in the extremities. No change in bowel or bladder control.  MUSCULOSKELETAL: No muscle, back pain, joint pain or stiffness.  LYMPHATICS: No enlarged nodes. No history of splenectomy.  PSYCHIATRIC: No history of depression or anxiety.  ENDOCRINOLOGIC: No reports of sweating, cold or heat  intolerance. No polyuria or polydipsia.  Marland Kitchen   Physical Examination Today's Vitals   10/12/23 1022  BP: 124/78  Pulse: 93  SpO2: 95%  Weight: 172 lb 12.8 oz (78.4 kg)  Height: 5\' 6"  (1.676 m)   Body mass index is 27.89 kg/m.  Gen: resting comfortably, no acute distress HEENT: no scleral icterus, pupils equal round and reactive, no palptable cervical adenopathy,  CV: irreg, no m/rg, no jvd Resp: Clear to auscultation bilaterally GI: abdomen is soft, non-tender, non-distended, normal bowel sounds, no hepatosplenomegaly MSK: extremities are warm, no edema.  Skin: warm, no rash Neuro:  no focal deficits Psych: appropriate affect   Diagnostic Studies   ECHO 2019: - Left ventricle: The cavity size was normal. Wall thickness was   increased in a pattern of moderate LVH. Systolic function was   vigorous. The estimated ejection fraction was in the range of 65%   to 70%. Wall motion was normal; there were no regional wall   motion abnormalities. Features are consistent with a pseudonormal   left ventricular filling pattern, with concomitant abnormal   relaxation and increased filling pressure (grade 2 diastolic   dysfunction). - Aortic valve: Mildly calcified annulus. Trileaflet. - Mitral valve: There was mild regurgitation. - Left atrium: The atrium was moderately dilated. - Right atrium: Central venous pressure (est): 3 mm Hg. - Atrial septum: No defect or patent foramen ovale was identified. - Tricuspid  valve: There was trivial regurgitation. - Pulmonary arteries: Systolic pressure could not be accurately   estimated. - Pericardium, extracardiac: There was no pericardial effusion   08/2021 echo 1. Left ventricular ejection fraction, by estimation, is 70 to 75%. The  left ventricle has hyperdynamic function. The left ventricle has no  regional wall motion abnormalities. There is moderate concentric left  ventricular hypertrophy. Left ventricular  diastolic parameters are consistent with Grade II diastolic dysfunction  (pseudonormalization).   2. Right ventricular systolic function is normal. The right ventricular  size is normal. There is mildly elevated pulmonary artery systolic  pressure. The estimated right ventricular systolic pressure is 38.8 mmHg.   3. Left atrial size was mildly dilated.   4. The mitral valve is abnormal. Mild mitral valve regurgitation.   5. The aortic valve is tricuspid. Aortic valve regurgitation is not  visualized. Mild aortic valve sclerosis is present, with no evidence of  aortic valve stenosis.   6. The inferior vena cava is normal in size with greater than 50%  respiratory variability, suggesting right atrial pressure of 3 mmHg.   Comparison(s): Prior images unable to be directly viewed.       Assessment and Plan   1. PAF/acquired thrombophilia -some recent palpitations, we will increase lopressor to 37.5mg  bid - continue eliquis for stroke prevention   2. HTN -bp is at goal, continue current meds   3. Hyperlipidemia - pcp had stopped statin due to muscle aches, on zetia - request pcp labs   4. Pulmonary HTN - mild by echo with normal RV, some enlargement of PA on prior CT scans - likely secondary to COPD by PFTs, grade II diastolic dysfunction - no recent symptoms   5. Coronary atherosclerosis - noted on CT scan - asymptomatic. Did not tolerate statin, she is on zetia. Not on ASA since on eliquis   F/u 6 months     Antoine Poche, M.D.

## 2023-10-13 ENCOUNTER — Encounter: Payer: Self-pay | Admitting: *Deleted

## 2023-11-15 DIAGNOSIS — E782 Mixed hyperlipidemia: Secondary | ICD-10-CM | POA: Diagnosis not present

## 2023-11-15 DIAGNOSIS — E7849 Other hyperlipidemia: Secondary | ICD-10-CM | POA: Diagnosis not present

## 2023-11-15 DIAGNOSIS — N183 Chronic kidney disease, stage 3 unspecified: Secondary | ICD-10-CM | POA: Diagnosis not present

## 2023-11-15 DIAGNOSIS — E1165 Type 2 diabetes mellitus with hyperglycemia: Secondary | ICD-10-CM | POA: Diagnosis not present

## 2023-11-21 DIAGNOSIS — I10 Essential (primary) hypertension: Secondary | ICD-10-CM | POA: Diagnosis not present

## 2023-11-21 DIAGNOSIS — Z720 Tobacco use: Secondary | ICD-10-CM | POA: Diagnosis not present

## 2023-11-21 DIAGNOSIS — C7800 Secondary malignant neoplasm of unspecified lung: Secondary | ICD-10-CM | POA: Diagnosis not present

## 2023-11-29 ENCOUNTER — Inpatient Hospital Stay: Payer: Medicare Other | Attending: Hematology

## 2023-11-29 ENCOUNTER — Ambulatory Visit (HOSPITAL_COMMUNITY)
Admission: RE | Admit: 2023-11-29 | Discharge: 2023-11-29 | Disposition: A | Discharge status: Home / Self Care | Payer: Medicare Other | Source: Ambulatory Visit | Attending: Hematology | Admitting: Hematology

## 2023-11-29 DIAGNOSIS — C3431 Malignant neoplasm of lower lobe, right bronchus or lung: Secondary | ICD-10-CM | POA: Insufficient documentation

## 2023-11-29 DIAGNOSIS — K449 Diaphragmatic hernia without obstruction or gangrene: Secondary | ICD-10-CM | POA: Diagnosis not present

## 2023-11-29 DIAGNOSIS — Z8572 Personal history of non-Hodgkin lymphomas: Secondary | ICD-10-CM | POA: Diagnosis not present

## 2023-11-29 DIAGNOSIS — R7989 Other specified abnormal findings of blood chemistry: Secondary | ICD-10-CM | POA: Insufficient documentation

## 2023-11-29 DIAGNOSIS — E611 Iron deficiency: Secondary | ICD-10-CM

## 2023-11-29 DIAGNOSIS — Z85118 Personal history of other malignant neoplasm of bronchus and lung: Secondary | ICD-10-CM | POA: Diagnosis not present

## 2023-11-29 DIAGNOSIS — J984 Other disorders of lung: Secondary | ICD-10-CM | POA: Diagnosis not present

## 2023-11-29 LAB — COMPREHENSIVE METABOLIC PANEL
ALT: 17 U/L (ref 0–44)
AST: 22 U/L (ref 15–41)
Albumin: 3.8 g/dL (ref 3.5–5.0)
Alkaline Phosphatase: 70 U/L (ref 38–126)
Anion gap: 15 (ref 5–15)
BUN: 14 mg/dL (ref 8–23)
CO2: 21 mmol/L — ABNORMAL LOW (ref 22–32)
Calcium: 9.4 mg/dL (ref 8.9–10.3)
Chloride: 101 mmol/L (ref 98–111)
Creatinine, Ser: 0.83 mg/dL (ref 0.44–1.00)
GFR, Estimated: >60 mL/min (ref >60)
Glucose, Bld: 203 mg/dL — ABNORMAL HIGH (ref 70–99)
Potassium: 3.8 mmol/L (ref 3.5–5.1)
Sodium: 137 mmol/L (ref 135–145)
Total Bilirubin: 0.7 mg/dL (ref 0.0–1.2)
Total Protein: 7.5 g/dL (ref 6.5–8.1)

## 2023-11-29 LAB — CBC WITH DIFFERENTIAL/PLATELET
Abs Immature Granulocytes: 0.02 10*3/uL (ref 0.00–0.07)
Basophils Absolute: 0 10*3/uL (ref 0.0–0.1)
Basophils Relative: 1 %
Eosinophils Absolute: 0.1 10*3/uL (ref 0.0–0.5)
Eosinophils Relative: 1 %
HCT: 42.3 % (ref 36.0–46.0)
Hemoglobin: 13.4 g/dL (ref 12.0–15.0)
Immature Granulocytes: 0 %
Lymphocytes Relative: 38 %
Lymphs Abs: 2.4 10*3/uL (ref 0.7–4.0)
MCH: 29 pg (ref 26.0–34.0)
MCHC: 31.7 g/dL (ref 30.0–36.0)
MCV: 91.6 fL (ref 80.0–100.0)
Monocytes Absolute: 0.5 10*3/uL (ref 0.1–1.0)
Monocytes Relative: 7 %
Neutro Abs: 3.4 10*3/uL (ref 1.7–7.7)
Neutrophils Relative %: 53 %
Platelets: 202 10*3/uL (ref 150–400)
RBC: 4.62 MIL/uL (ref 3.87–5.11)
RDW: 14.1 % (ref 11.5–15.5)
WBC: 6.3 10*3/uL (ref 4.0–10.5)
nRBC: 0 % (ref 0.0–0.2)

## 2023-11-29 LAB — IRON AND TIBC
Iron: 90 ug/dL (ref 28–170)
Saturation Ratios: 24 % (ref 10.4–31.8)
TIBC: 373 ug/dL (ref 250–450)
UIBC: 283 ug/dL

## 2023-11-29 LAB — FERRITIN: Ferritin: 50 ng/mL (ref 11–307)

## 2023-12-08 ENCOUNTER — Inpatient Hospital Stay: Payer: Medicare Other | Attending: Oncology | Admitting: Oncology

## 2023-12-08 VITALS — BP 133/71 | HR 73 | Temp 96.7°F | Resp 20 | Wt 174.6 lb

## 2023-12-08 DIAGNOSIS — R5383 Other fatigue: Secondary | ICD-10-CM | POA: Insufficient documentation

## 2023-12-08 DIAGNOSIS — M816 Localized osteoporosis [Lequesne]: Secondary | ICD-10-CM | POA: Diagnosis not present

## 2023-12-08 DIAGNOSIS — C3432 Malignant neoplasm of lower lobe, left bronchus or lung: Secondary | ICD-10-CM | POA: Diagnosis not present

## 2023-12-08 DIAGNOSIS — E559 Vitamin D deficiency, unspecified: Secondary | ICD-10-CM | POA: Insufficient documentation

## 2023-12-08 DIAGNOSIS — C3431 Malignant neoplasm of lower lobe, right bronchus or lung: Secondary | ICD-10-CM

## 2023-12-08 DIAGNOSIS — Z902 Acquired absence of lung [part of]: Secondary | ICD-10-CM | POA: Insufficient documentation

## 2023-12-08 DIAGNOSIS — E611 Iron deficiency: Secondary | ICD-10-CM | POA: Insufficient documentation

## 2023-12-08 DIAGNOSIS — Z8572 Personal history of non-Hodgkin lymphomas: Secondary | ICD-10-CM | POA: Insufficient documentation

## 2023-12-08 DIAGNOSIS — Z85118 Personal history of other malignant neoplasm of bronchus and lung: Secondary | ICD-10-CM | POA: Insufficient documentation

## 2023-12-08 DIAGNOSIS — M810 Age-related osteoporosis without current pathological fracture: Secondary | ICD-10-CM | POA: Insufficient documentation

## 2023-12-08 NOTE — Progress Notes (Signed)
 [facility] [address] [place] [address]    Clinic Day:  12/08/23   Referring physician: Atilano Deward ORN, MD  Patient Care Team: Atilano Deward ORN, MD as PCP - General (Family Medicine) Alvan Dorn FALCON, MD as PCP - Cardiology (Cardiology) Shaaron Lamar HERO, MD as Consulting Physician (Gastroenterology) Miriam Norris, NP as Nurse Practitioner (Cardiology)   ASSESSMENT & PLAN:   Assessment:  1.  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 -Adjuvant therapy was not recommended. -She receives every 44-month CT scans for surveillance. - CT chest (05/23/2023): Prior bilateral lower lobectomies with no evidence of recurrence. Mild compression deformities of T3 and T4 new compared to prior scan.   2.  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   3.  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  H. pylori negative. -XRT 30 Gray in 15 fractions from 03/18/2016 through 04/18/2016. -EGD biopsy on 06/13/2018 shows mild chronic gastritis and small lymphoid aggregates with no features of lymphoma. -PET scan on 08/13/2019 did not show any abnormal uptake associated with abdominal organs.  No lymphadenopathy. -Colonoscopy on 11/29/2018 showed diverticulosis in the sigmoid colon with 2 subcentimeter polyps in sigmoid colon.  Plan:  1.  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 -CT chest without contrast from 11/29/2023 shows new 14 mm irregular subpleural nodule lesion in the left upper lobe anteriorly has the  appearance of rounded atelectasis.  Would recommend short-term follow-up of chest in 3 months to reassess.  Status post bilateral lower lobe lobectomies.  No findings suspicious for metastatic disease.  Stable severe underlying emphysematous change and pulmonary scarring.  Stable small scattered mediastinal and hilar lymph nodes.  No new or progressive findings.  Stable very large hiatal hernia with most of the stomach up in the chest.  Stable advanced vascular disease including three-vessel coronary artery calcifications. -Labs from 11/29/2023 are essentially unremarkable. -Recommend repeat CT chest in approximately 3 months follow-up shortly after.   2.  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  Repeat in 3 months with follow-up shortly after.   3.  Gastric marginal zone lymphoma: - No B symptoms or palpable adenopathy.   4.  Low vitamin D  levels/osteoporosis: - Had bone density on 06/24/2023 which showed a T-score of -3.3. -Discussed bisphosphonate therapy with injectable versus oral.  She is not necessarily interested in either due to side effects but have discussed Fosamax in detail and sent information via MyChart for her to review.  Will discuss at her next visit. -Continue vitamin D  400 units daily.  Vitamin D  level was normal at 40.59 6 months ago.  Will redraw at next visit.  5.  Low iron levels: -She is currently taking an iron supplement daily and tolerating well. -Most recent labs from 11/29/2023 show a ferritin of 50, iron saturation 24% with TIBC 373.  Hemoglobin is WNL. -Continue iron supplement and will recheck labs in 3 months.  6.  Fatigue: -Likely multifactorial. -Discussed drawing B12 levels at next lab draw. -Recommend starting B12 supplement 1000 mcg daily.  Orders  Placed This Encounter  Procedures   CT Chest W Contrast    Standing Status:   Future    Expected Date:   03/06/2024    Expiration Date:   12/07/2024    If indicated for the  ordered procedure, I authorize the administration of contrast media per Radiology protocol:   Yes    Does the patient have a contrast media/X-ray dye allergy?:   No    Preferred imaging location?:   [facility]   Ferritin    Standing Status:   Future    Expected Date:   03/06/2024    Expiration Date:   12/07/2024   Iron and TIBC ([facility] ONLY)    Standing Status:   Future    Expected Date:   03/06/2024    Expiration Date:   12/07/2024   Comprehensive metabolic panel    Standing Status:   Future    Expected Date:   03/06/2024    Expiration Date:   12/07/2024   CBC with Differential    Standing Status:   Future    Expected Date:   03/06/2024    Expiration Date:   12/07/2024   Vitamin B12    Standing Status:   Future    Expected Date:   03/06/2024    Expiration Date:   12/07/2024   Methylmalonic acid, serum    Standing Status:   Future    Expected Date:   03/06/2024    Expiration Date:   12/07/2024   Vitamin D  25 hydroxy    Standing Status:   Future    Expected Date:   06/06/2024    Expiration Date:   12/07/2024    Delon FORBES Hope, NP   2/6/202510:04 AM  CHIEF COMPLAINT:   Diagnosis: Malignant neoplasm of lower lobe of right lung    Cancer Staging  Lung cancer [facility]) Staging form: Lung, AJCC 8th Edition - Clinical: cT1b, cN0 - Signed by Mora Lance, MD on 06/06/2018  MALToma Staging form: Lymphoid Neoplasms, AJCC 6th Edition - Clinical stage from 03/23/2016: Stage I - Signed by Berry Debby RAMAN, PA-C on 03/23/2016    Prior Therapy: 1. Left lower lobectomy on 05/19/2018. 2. VATS with right lower lobectomy on 09/10/2019.  Current Therapy:  surveillance   HISTORY OF PRESENT ILLNESS:   Oncology History  MALToma  03/21/2015 Miscellaneous   H Pylori IgG NEGATIVE   02/03/2016 Procedure   EGD Dr. Shaaron, abnormal gastric mucosa. Pathology with EXTRANODAL Marginal zone lymphoma. NEGATIVE for H. Pylori   03/03/2016 Miscellaneous   H pylori stool antigen NEGATIVE    03/03/2016 PET scan   Focal area of hypermetabolism and wall thickening involving the body antral junction region of the stomach c/w history of lymphoma. 5.5 mm LLL pulm nodule not hypermetabolic. Non contrast chest CT in 6 month   03/18/2016 - 04/08/2016 Radiation Therapy   The gastric tumor received 30 Gy in 15 fractions of 2 Gy   Lung cancer (HCC)  05/19/2018 Initial Diagnosis   Lung cancer (HCC)   06/06/2018 Cancer Staging   Staging form: Lung, AJCC 8th Edition - Clinical: cT1b, cN0 - Signed by Mora Lance, MD on 06/06/2018      INTERVAL HISTORY:   Monica [name] is a 83 y.o. female presenting to clinic today for follow up of left and right lung cancer. She was last seen in clinic on 05/30/2023.  In the interim, she had an EGD with Dr. Shaaron on 06/16/2023 for dysphagia and early satiety.  EGD revealed large hiatal hernia with dilated Schatzki ring.  She was started on Protonix  twice daily.  Dysphagia has slowly improved.  She underwent a chest CT on 11/29/2023 which showed a new 14 mm irregular subpleural nodule lesion in the left upper lobe anteriorly has the appearance of rounded atelectasis.  Would recommend short-term follow-up in 3 months to reassess.  Status post bilateral lower lobe lobectomies.  No findings suspicious for metastatic disease.  Stable severe underlying emphysematous changes and pulmonary scarring.  Stable small scattered mediastinal and hilar lymph nodes.  No new or progressive findings.  Stable very large hiatal hernia with most of the stomach in the chest.  Stable advanced vascular disease including three-vessel coronary artery calcifications.  She had a bone density scan on 06/24/2023 which showed osteoporosis T-score of -3.3 mainly in left radius and left femur.  She is currently taking calcium and vitamin D .  She is not on a bisphosphonate.  Today, she states that she is doing well overall.  Reports chronic but stable fatigue.  She has rotating diarrhea and nausea.  Has  shortness of breath with exertion.  States after her left lower lobectomy she felt okay but when they removed the right she has not been the same since.  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  She is taking her Protonix  as prescribed.  Dr. Shaaron does not recommend surgery to repair this due to age.  Her appetite level is at 75%. Her energy level is at 0%. She is accompanied by her husband.   PAST MEDICAL HISTORY:   Past Medical History: Past Medical History:  Diagnosis Date   Cancer (HCC)    NHL, Malt Lymphoma   Depression    Diverticulitis 04/28/2020   DM (diabetes mellitus) (HCC)    TYPE  2   Dysphagia    Dysrhythmia    History of palpatations   GERD (gastroesophageal reflux disease)    INGESTION    Heart palpitations    Hiatal hernia    Hyperlipidemia    Interstitial cystitis    Iron deficiency 04/28/2020   Lung cancer, lower lobe (HCC) 07/21/2018   Left lower lobe, stage Ia squamous cell carcinoma   MALT (mucosa associated lymphoid tissue)    gastric   Sinusitis     Surgical History: Past Surgical History:  Procedure Laterality Date   ABDOMINAL HYSTERECTOMY     BIOPSY  09/01/2016   Procedure: BIOPSY;  Surgeon: Lamar CHRISTELLA Shaaron, MD;  [facility];  Service: Endoscopy;;  gastric   BIOPSY  11/29/2018   Procedure: BIOPSY;  Surgeon: Shaaron Lamar CHRISTELLA, MD;  [facility];  Service: Endoscopy;;  right colon   BLADDER SURGERY     CATARACT EXTRACTION W/ INTRAOCULAR LENS  IMPLANT, BILATERAL  2015   CATARACT EXTRACTION, BILATERAL     COLONOSCOPY     Dr. Barry 2009: Normal per PCP notes   COLONOSCOPY N/A 11/29/2018   Rourk: Diverticulosis, 3 polyps removed.  No evidence of colitis.  Tubular adenomas.  No future surveillance colonoscopies recommended due to age.   ESOPHAGOGASTRODUODENOSCOPY     RMR: Prominant Schzgzki ring/component of peptic stricture status post dilation and disruption as  described above, otherwise norma esophagus, moderate-sized hiatal hernia, antal pyloric channel, and posterier bulbar erosions, otherwise unremarkable stomach, D1 and D2 . Inflammatory findings on the stomach and duodenum will likely be related to aspirin  effect. We need to rule out  Helicobacter pylor   ESOPHAGOGASTRODUODENOSCOPY N/A 03/10/2015   Dr. 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 Pathology with lymphoid population of stomach, slight atypia   ESOPHAGOGASTRODUODENOSCOPY N/A 02/03/2016   Dr. Shaaron: Schatzki ring noted at GE junction, dilated with 56 and then 58 French Maloney dilator. Large hiatal hernia. 6 x 7 cm nodular geographically ulcerated mucosa, biopsy c/w MALToma   ESOPHAGOGASTRODUODENOSCOPY N/A 04/08/2016   Dr. Shaaron: moderate Schatzki's ring/web s/p dilation, large hiatal hernia, localized area of gastric lymphoma visualized and appeared to be much improved. normal second portion of the duodenum. No specimens collected. Esophageal lumen notably tighted up significantly since her dilation in April of this year.    ESOPHAGOGASTRODUODENOSCOPY N/A 08/04/2016   Procedure: ESOPHAGOGASTRODUODENOSCOPY (EGD);  Surgeon: Lamar CHRISTELLA Shaaron, MD;  [facility];  Service: Endoscopy;  Laterality: N/A;  7:30 am   ESOPHAGOGASTRODUODENOSCOPY N/A 09/01/2016   Procedure: ESOPHAGOGASTRODUODENOSCOPY (EGD);  Surgeon: Lamar CHRISTELLA Shaaron, MD;  [facility];  Service: Endoscopy;  Laterality: N/A;  830    ESOPHAGOGASTRODUODENOSCOPY N/A 05/10/2017   Dr. Shaaron: Moderate Schatzki ring at the GE junction, status post dilation with 54 French.  Medium sized hiatal hernia.  Few localized erosions in the gastric antrum.  Stomach biopsy showed chronic gastritis, no H. pylori.  No atypical lymphoid infiltrates or other features of lymphoproliferative process   ESOPHAGOGASTRODUODENOSCOPY N/A 06/13/2018   Dr. Shaaron: Schatzki ring status post  dilation.  Large hiatal hernia.  Focal area 4 x 4 cm along the greater curvature, somewhat erythematous and thickened mucosa, biopsy benign.  Next EGD in August 2020.   ESOPHAGOGASTRODUODENOSCOPY N/A 01/14/2021   Procedure: ESOPHAGOGASTRODUODENOSCOPY (EGD);  Surgeon: Shaaron Lamar CHRISTELLA, MD;  [facility];  Service: Endoscopy;  Laterality: N/A;  AM   ESOPHAGOGASTRODUODENOSCOPY (EGD) WITH PROPOFOL  N/A 06/16/2023   Procedure: ESOPHAGOGASTRODUODENOSCOPY (EGD) WITH PROPOFOL ;  Surgeon: Shaaron Lamar CHRISTELLA, MD;  [facility];  Service: Endoscopy;  Laterality: N/A;  1:30 pm, asa 3   EYE SURGERY     INTERCOSTAL NERVE BLOCK Right 09/10/2019   Procedure: Intercostal Nerve Block;  Surgeon: Kerrin Elspeth BROCKS, MD;  Location: [facility] OR;  Service: Thoracic;  Laterality: Right;   LOBECTOMY Left 05/19/2018   Procedure: LEFT LOWER LOBECTOMY;  Surgeon: Kerrin Elspeth BROCKS, MD;  Location: [facility] OR;  Service: Thoracic;  Laterality: Left;   LYMPH NODE DISSECTION Right 09/10/2019   Procedure: Lymph Node Dissection;  Surgeon: Kerrin Elspeth BROCKS, MD;  Location: [facility] OR;  Service: Thoracic;  Laterality: Right;   MALONEY DILATION N/A 03/10/2015   Procedure: AGAPITO HODGKIN;  Surgeon: Lamar CHRISTELLA Shaaron, MD;  [facility];  Service: Endoscopy;  Laterality: N/A;   MALONEY DILATION N/A 02/03/2016   Procedure: AGAPITO DILATION;  Surgeon: Lamar CHRISTELLA Shaaron, MD;  [facility];  Service: Endoscopy;  Laterality: N/A;   MALONEY DILATION N/A 04/08/2016   Procedure: AGAPITO DILATION;  Surgeon: Lamar CHRISTELLA Shaaron, MD;  [facility];  Service: Endoscopy;  Laterality: N/A;   MALONEY DILATION N/A 05/10/2017   Procedure: AGAPITO DILATION;  Surgeon: Shaaron Lamar CHRISTELLA, MD;  [facility];  Service: Endoscopy;  Laterality: N/A;   MALONEY DILATION N/A 06/13/2018   Procedure: AGAPITO DILATION;  Surgeon: Shaaron Lamar CHRISTELLA, MD;  [facility];  Service: Endoscopy;  Laterality: N/A;   MALONEY DILATION N/A  01/14/2021   Procedure: AGAPITO DILATION;  Surgeon: Shaaron Lamar CHRISTELLA, MD;  [facility];  Service: Endoscopy;  Laterality: N/A;   MALONEY DILATION N/A 06/16/2023   Procedure: AGAPITO DILATION;  Surgeon: Shaaron Lamar HERO, MD;  [facility];  Service: Endoscopy;  Laterality: N/A;   POLYPECTOMY  11/29/2018   Procedure: POLYPECTOMY;  Surgeon: Shaaron Lamar HERO, MD;  [facility];  Service: Endoscopy;;   VIDEO ASSISTED THORACOSCOPY (VATS)/ LOBECTOMY Right 09/10/2019   Procedure: VIDEO ASSISTED THORACOSCOPY (VATS)/RIGHT LOWER LOBECTOMY;  Surgeon: Kerrin Elspeth BROCKS, MD;  Location[facility] OR;  Service: Thoracic;  Laterality: Right;   VIDEO ASSISTED THORACOSCOPY (VATS)/WEDGE RESECTION Left 05/19/2018   Procedure: VIDEO ASSISTED THORACOSCOPY (VATS)/WEDGE RESECTION;  Surgeon: Kerrin Elspeth BROCKS, MD;  Location: [facility] OR;  Service: Thoracic;  Laterality: Left;    Social History: Social History   Socioeconomic History   Marital status: Married    Spouse name: Not on file   Number of children: Not on file   Years of education: Not on file   Highest education level: Not on file  Occupational History   Not on file  Tobacco Use   Smoking status: Some Days    Current packs/day: 0.00    Average packs/day: 0.5 packs/day for 57.0 years (28.5 ttl pk-yrs)    Types: Cigarettes    Start date: 08/01/1965    Last attempt to quit: 08/01/2022    Years since quitting: 1.3    Passive exposure: Never   Smokeless tobacco: Never  Vaping Use   Vaping status: Never Used  Substance and Sexual Activity   Alcohol  use: No    Alcohol /week: 0.0 standard drinks of alcohol    Drug use: No   Sexual activity: Not Currently  Other Topics Concern   Not on file  Social History Narrative   Not on file   Social Drivers of Health   Financial Resource Strain: Not on file  Food Insecurity: Not on file  Transportation Needs: Not on file  Physical Activity: Not on file  Stress: Not on file  Social  Connections: Not on file  Intimate Partner Violence: Not on file    Family History: Family History  Problem Relation Age of Onset   Heart attack Father    Dementia Father    Diabetes Sister    Diabetes Brother    Diabetes Sister    Colon cancer Neg Hx     Current Medications:  Current Outpatient Medications:    ACCU-CHEK GUIDE TEST test strip, TEST TWICE DAILY E11.65, Disp: , Rfl:    Accu-Chek Softclix Lancets lancets, 2 (two) times daily., Disp: , Rfl:    albuterol  (VENTOLIN  HFA) 108 (90 Base) MCG/ACT inhaler, Inhale 2 puffs into the lungs 4 (four) times daily., Disp: , Rfl:    amLODipine  (NORVASC ) 5 MG tablet, TAKE 1 TABLET BY MOUTH DAILY, Disp: 100 tablet, Rfl: 2   apixaban  (ELIQUIS ) 5 MG TABS tablet, TAKE 1 TABLET BY MOUTH TWICE  DAILY, Disp: 200 tablet, Rfl: 1   Blood Glucose Monitoring Suppl (ACCU-CHEK GUIDE ME) w/Device KIT, TEST TWICE DAILY E11.65, Disp: , Rfl:    cholecalciferol (VITAMIN D3) 25 MCG (1000 UNIT) tablet, Take 1,000 Units by mouth daily., Disp: , Rfl:    Cranberry 125 MG TABS, Take by mouth., Disp: , Rfl:    ezetimibe  (ZETIA ) 10 MG tablet, Take 10 mg by mouth daily., Disp: , Rfl:    ferrous sulfate 325 (65 FE) MG tablet, Take 325 mg by mouth daily with breakfast., Disp: , Rfl:    losartan  (COZAAR ) 50 MG tablet, Take  50 mg by mouth daily., Disp: , Rfl:    metoprolol  tartrate 37.5 MG TABS, Take 1 tablet (37.5 mg total) by mouth 2 (two) times daily., Disp: 180 tablet, Rfl: 1   Multiple Vitamin (MULTIVITAMIN WITH MINERALS) TABS tablet, Take 1 tablet by mouth daily., Disp: , Rfl:    pantoprazole  (PROTONIX ) 40 MG tablet, Take 1 tablet (40 mg total) by mouth daily., Disp: 90 tablet, Rfl: 3   pioglitazone  (ACTOS ) 15 MG tablet, Take 15 mg by mouth daily., Disp: , Rfl:    nitroGLYCERIN  (NITROSTAT ) 0.4 MG SL tablet, Place 1 tablet (0.4 mg total) under the tongue every 5 (five) minutes x 3 doses as needed for chest pain (If no relief after 3rd dose, GO TO ED)., Disp: 30  tablet, Rfl: 1   Allergies: Allergies  Allergen Reactions   Elemental Sulfur  Hives    REVIEW OF SYSTEMS:   Review of Systems  Constitutional:  Positive for fatigue.  Respiratory:  Positive for cough and shortness of breath.   Gastrointestinal:  Positive for constipation and diarrhea.     VITALS:   Blood pressure 133/71, pulse 73, temperature (!) 96.7 F (35.9 C), temperature source Tympanic, resp. rate 20, weight 174 lb 9.7 oz (79.2 kg), SpO2 97%.  Wt Readings from Last 3 Encounters:  12/08/23 174 lb 9.7 oz (79.2 kg)  10/12/23 172 lb 12.8 oz (78.4 kg)  09/06/23 174 lb 9.6 oz (79.2 kg)    Body mass index is 28.18 kg/m.  Performance status (ECOG): 1 - Symptomatic but completely ambulatory  PHYSICAL EXAM:   Physical Exam Constitutional:      Appearance: Normal appearance.  HENT:     Head: Normocephalic and atraumatic.  Eyes:     Pupils: Pupils are equal, round, and reactive to light.  Cardiovascular:     Rate and Rhythm: Normal rate and regular rhythm.     Heart sounds: Normal heart sounds. No murmur heard. Pulmonary:     Effort: Pulmonary effort is normal.     Breath sounds: Decreased breath sounds present. No wheezing.  Abdominal:     General: Bowel sounds are normal. There is no distension.     Palpations: Abdomen is soft.     Tenderness: There is no abdominal tenderness.  Musculoskeletal:        General: Normal range of motion.     Cervical back: Normal range of motion.  Skin:    General: Skin is warm and dry.     Findings: No rash.  Neurological:     Mental Status: She is alert and oriented to person, place, and time.  Psychiatric:        Judgment: Judgment normal.     LABS:      Latest Ref Rng & Units 11/29/2023    8:51 AM 05/23/2023   12:06 PM 01/23/2023    4:29 AM  CBC  WBC 4.0 - 10.5 K/uL 6.3  6.1  14.3   Hemoglobin 12.0 - 15.0 g/dL 86.5  86.0  86.9   Hematocrit 36.0 - 46.0 % 42.3  42.4  40.1   Platelets 150 - 400 K/uL 202  271  294        Latest Ref Rng & Units 11/29/2023    8:51 AM 05/23/2023   12:06 PM 01/23/2023    4:29 AM  CMP  Glucose 70 - 99 mg/dL 796  757  814   BUN 8 - 23 mg/dL 14  13  14    Creatinine 0.44 - 1.00  mg/dL 9.16  9.19  9.15   Sodium 135 - 145 mmol/L 137  135  135   Potassium 3.5 - 5.1 mmol/L 3.8  4.0  4.0   Chloride 98 - 111 mmol/L 101  100  102   CO2 22 - 32 mmol/L 21  24  23    Calcium 8.9 - 10.3 mg/dL 9.4  9.2  8.9   Total Protein 6.5 - 8.1 g/dL 7.5  7.7    Total Bilirubin 0.0 - 1.2 mg/dL 0.7  0.5    Alkaline Phos 38 - 126 U/L 70  59    AST 15 - 41 U/L 22  20    ALT 0 - 44 U/L 17  16       No results found for: CEA1, CEA / No results found for: CEA1, CEA No results found for: PSA1 No results found for: CAN199 No results found for: RJW874  Lab Results  Component Value Date   TOTALPROTELP 6.6 03/01/2016   TOTALPROTELP 6.4 03/01/2016   ALBUMINELP 3.7 03/01/2016   A1GS 0.1 03/01/2016   A2GS 0.8 03/01/2016   BETS 1.0 03/01/2016   GAMS 0.9 03/01/2016   MSPIKE Not Observed 03/01/2016   SPEI Comment 03/01/2016   Lab Results  Component Value Date   TIBC 373 11/29/2023   TIBC 376 05/23/2023   TIBC 341 11/18/2022   FERRITIN 50 11/29/2023   FERRITIN 42 05/23/2023   FERRITIN 27 11/18/2022   IRONPCTSAT 24 11/29/2023   IRONPCTSAT 30 05/23/2023   IRONPCTSAT 19 11/18/2022   Lab Results  Component Value Date   LDH 111 11/18/2022   LDH 119 06/17/2022   LDH 122 12/17/2021     STUDIES:   CT Chest Wo Contrast Result Date: 12/07/2023 CLINICAL DATA:  Restaging non-small cell lung cancer (squamous cell carcinoma) of the right lower lobe. * Tracking Code: BO * EXAM: CT CHEST WITHOUT CONTRAST TECHNIQUE: Multidetector CT imaging of the chest was performed following the standard protocol without IV contrast. RADIATION DOSE REDUCTION: This exam was performed according to the departmental dose-optimization program which includes automated exposure control, adjustment of the mA and/or kV  according to patient size and/or use of iterative reconstruction technique. COMPARISON:  Multiple prior imaging studies. The most recent CT scan is 05/23/2023 FINDINGS: Cardiovascular: The heart is normal in size. No pericardial effusion. Stable advanced atherosclerotic calcification involving the aorta and branch vessels including three-vessel coronary artery calcifications. No aortic aneurysm. Enlarged pulmonary arteries suggesting pulmonary hypertension. Mediastinum/Nodes: Small scattered mediastinal and hilar lymph nodes are stable. No new or progressive findings. Stable very large hiatal hernia with most of the stomach up in the chest. Lungs/Pleura: Stable severe underlying emphysematous changes and pulmonary scarring. Surgical changes from bilateral lower lobe lobectomies. There is a irregular subpleural nodular lesion in the left upper lobe anteriorly image number 70/3 which measures a maximum of 14 mm. This was not present on the prior study and has the appearance of rounded atelectasis. Would recommend a short-term follow-up chest CT in 3 months to reassess. No other pulmonary lesions or pulmonary nodules are identified. No pleural effusions or pleural nodules. Upper Abdomen: No significant upper abdominal findings. No hepatic or adrenal gland lesions. Stable advanced vascular disease. Stable advanced pancreatic atrophy. Musculoskeletal: No significant bony findings. Degenerative changes but no lytic or sclerotic bone lesions. IMPRESSION: 1. New 14 mm irregular subpleural nodular lesion in the left upper lobe anteriorly has the appearance of rounded atelectasis. Would recommend a short-term follow-up chest CT in 3  months to reassess. 2. Status post bilateral lower lobe lobectomies. No findings suspicious for metastatic disease. 3. Stable severe underlying emphysematous changes and pulmonary scarring. 4. Stable small scattered mediastinal and hilar lymph nodes. No new or progressive findings. 5. Stable very  large hiatal hernia with most of the stomach up in the chest. 6. Stable advanced vascular disease including three-vessel coronary artery calcifications. Electronically Signed   By: MYRTIS Stammer M.D.   On: 12/07/2023 19:17

## 2023-12-20 ENCOUNTER — Other Ambulatory Visit: Payer: Self-pay | Admitting: Cardiology

## 2024-01-02 DIAGNOSIS — N3020 Other chronic cystitis without hematuria: Secondary | ICD-10-CM | POA: Diagnosis not present

## 2024-01-24 ENCOUNTER — Other Ambulatory Visit: Payer: Self-pay | Admitting: Cardiology

## 2024-01-24 NOTE — Telephone Encounter (Signed)
 Prescription refill request for Eliquis received. Indication: PAF Last office visit: 10/12/23  Dominga Ferry MD Scr: 0.83 on 11/29/23  Epic Age: 83 Weight: 78.4kg  Based on above findings Eliquis 5mg  twice daily is the appropriate dose.  Refill approved.

## 2024-01-31 DIAGNOSIS — H43811 Vitreous degeneration, right eye: Secondary | ICD-10-CM | POA: Diagnosis not present

## 2024-03-03 ENCOUNTER — Emergency Department (HOSPITAL_COMMUNITY)
Admission: EM | Admit: 2024-03-03 | Discharge: 2024-03-04 | Disposition: A | Discharge status: Home / Self Care | Attending: Emergency Medicine | Admitting: Emergency Medicine

## 2024-03-03 DIAGNOSIS — R1032 Left lower quadrant pain: Secondary | ICD-10-CM | POA: Diagnosis not present

## 2024-03-03 DIAGNOSIS — K5732 Diverticulitis of large intestine without perforation or abscess without bleeding: Secondary | ICD-10-CM | POA: Diagnosis not present

## 2024-03-03 DIAGNOSIS — E871 Hypo-osmolality and hyponatremia: Secondary | ICD-10-CM | POA: Insufficient documentation

## 2024-03-03 DIAGNOSIS — K5792 Diverticulitis of intestine, part unspecified, without perforation or abscess without bleeding: Secondary | ICD-10-CM | POA: Diagnosis not present

## 2024-03-03 DIAGNOSIS — D72829 Elevated white blood cell count, unspecified: Secondary | ICD-10-CM | POA: Insufficient documentation

## 2024-03-03 DIAGNOSIS — K449 Diaphragmatic hernia without obstruction or gangrene: Secondary | ICD-10-CM | POA: Diagnosis not present

## 2024-03-03 DIAGNOSIS — Z7901 Long term (current) use of anticoagulants: Secondary | ICD-10-CM | POA: Insufficient documentation

## 2024-03-03 DIAGNOSIS — R1031 Right lower quadrant pain: Secondary | ICD-10-CM | POA: Diagnosis not present

## 2024-03-03 DIAGNOSIS — R1030 Lower abdominal pain, unspecified: Secondary | ICD-10-CM | POA: Diagnosis not present

## 2024-03-04 ENCOUNTER — Emergency Department (HOSPITAL_COMMUNITY)

## 2024-03-04 ENCOUNTER — Encounter (HOSPITAL_COMMUNITY): Payer: Self-pay

## 2024-03-04 ENCOUNTER — Other Ambulatory Visit: Payer: Self-pay

## 2024-03-04 DIAGNOSIS — R1031 Right lower quadrant pain: Secondary | ICD-10-CM | POA: Diagnosis not present

## 2024-03-04 DIAGNOSIS — R1032 Left lower quadrant pain: Secondary | ICD-10-CM | POA: Diagnosis not present

## 2024-03-04 DIAGNOSIS — K449 Diaphragmatic hernia without obstruction or gangrene: Secondary | ICD-10-CM | POA: Diagnosis not present

## 2024-03-04 DIAGNOSIS — K5732 Diverticulitis of large intestine without perforation or abscess without bleeding: Secondary | ICD-10-CM | POA: Diagnosis not present

## 2024-03-04 LAB — COMPREHENSIVE METABOLIC PANEL WITH GFR
ALT: 13 U/L (ref 0–44)
AST: 15 U/L (ref 15–41)
Albumin: 3.3 g/dL — ABNORMAL LOW (ref 3.5–5.0)
Alkaline Phosphatase: 76 U/L (ref 38–126)
Anion gap: 9 (ref 5–15)
BUN: 16 mg/dL (ref 8–23)
CO2: 24 mmol/L (ref 22–32)
Calcium: 8.7 mg/dL — ABNORMAL LOW (ref 8.9–10.3)
Chloride: 99 mmol/L (ref 98–111)
Creatinine, Ser: 0.81 mg/dL (ref 0.44–1.00)
GFR, Estimated: >60 mL/min (ref >60)
Glucose, Bld: 201 mg/dL — ABNORMAL HIGH (ref 70–99)
Potassium: 4 mmol/L (ref 3.5–5.1)
Sodium: 132 mmol/L — ABNORMAL LOW (ref 135–145)
Total Bilirubin: 0.7 mg/dL (ref 0.0–1.2)
Total Protein: 7.2 g/dL (ref 6.5–8.1)

## 2024-03-04 LAB — URINALYSIS, W/ REFLEX TO CULTURE (INFECTION SUSPECTED)
Bacteria, UA: NONE SEEN
Bilirubin Urine: NEGATIVE
Glucose, UA: NEGATIVE mg/dL
Hgb urine dipstick: NEGATIVE
Ketones, ur: NEGATIVE mg/dL
Leukocytes,Ua: NEGATIVE
Nitrite: NEGATIVE
Protein, ur: NEGATIVE mg/dL
Specific Gravity, Urine: >1.046 — ABNORMAL HIGH (ref 1.005–1.030)
pH: 5 (ref 5.0–8.0)

## 2024-03-04 LAB — CBC WITH DIFFERENTIAL/PLATELET
Abs Immature Granulocytes: 0.04 10*3/uL (ref 0.00–0.07)
Basophils Absolute: 0 10*3/uL (ref 0.0–0.1)
Basophils Relative: 0 %
Eosinophils Absolute: 0.1 10*3/uL (ref 0.0–0.5)
Eosinophils Relative: 0 %
HCT: 39.6 % (ref 36.0–46.0)
Hemoglobin: 13.1 g/dL (ref 12.0–15.0)
Immature Granulocytes: 0 %
Lymphocytes Relative: 13 %
Lymphs Abs: 1.5 10*3/uL (ref 0.7–4.0)
MCH: 29.6 pg (ref 26.0–34.0)
MCHC: 33.1 g/dL (ref 30.0–36.0)
MCV: 89.6 fL (ref 80.0–100.0)
Monocytes Absolute: 1.2 10*3/uL — ABNORMAL HIGH (ref 0.1–1.0)
Monocytes Relative: 10 %
Neutro Abs: 8.9 10*3/uL — ABNORMAL HIGH (ref 1.7–7.7)
Neutrophils Relative %: 77 %
Platelets: 216 10*3/uL (ref 150–400)
RBC: 4.42 MIL/uL (ref 3.87–5.11)
RDW: 14.6 % (ref 11.5–15.5)
WBC: 11.7 10*3/uL — ABNORMAL HIGH (ref 4.0–10.5)
nRBC: 0 % (ref 0.0–0.2)

## 2024-03-04 LAB — LIPASE, BLOOD: Lipase: 20 U/L (ref 11–51)

## 2024-03-04 MED ORDER — FENTANYL CITRATE PF 50 MCG/ML IJ SOSY
50.0000 ug | PREFILLED_SYRINGE | Freq: Once | INTRAMUSCULAR | Status: AC
  Administered 2024-03-04: 50 ug via INTRAVENOUS
  Filled 2024-03-04: qty 1

## 2024-03-04 MED ORDER — HYDROMORPHONE HCL 1 MG/ML IJ SOLN
0.5000 mg | Freq: Once | INTRAMUSCULAR | Status: AC
  Administered 2024-03-04: 0.5 mg via INTRAVENOUS
  Filled 2024-03-04: qty 0.5

## 2024-03-04 MED ORDER — OXYCODONE-ACETAMINOPHEN 5-325 MG PO TABS: 1.0000 | ORAL_TABLET | Freq: Four times a day (QID) | ORAL | 0 refills | Status: DC | PRN

## 2024-03-04 MED ORDER — LACTATED RINGERS IV BOLUS
1000.0000 mL | Freq: Once | INTRAVENOUS | Status: AC
  Administered 2024-03-04: 1000 mL via INTRAVENOUS

## 2024-03-04 MED ORDER — ONDANSETRON 4 MG PO TBDP: ORAL_TABLET | ORAL | 0 refills | Status: DC

## 2024-03-04 MED ORDER — IOHEXOL 300 MG/ML  SOLN
100.0000 mL | Freq: Once | INTRAMUSCULAR | Status: AC | PRN
Start: 2024-03-04 — End: 2024-03-04
  Administered 2024-03-04: 100 mL via INTRAVENOUS

## 2024-03-04 MED ORDER — ONDANSETRON HCL 4 MG/2ML IJ SOLN
4.0000 mg | Freq: Once | INTRAMUSCULAR | Status: AC
  Administered 2024-03-04: 4 mg via INTRAVENOUS
  Filled 2024-03-04: qty 2

## 2024-03-04 MED ORDER — OXYCODONE-ACETAMINOPHEN 5-325 MG PO TABS
2.0000 | ORAL_TABLET | Freq: Once | ORAL | Status: AC
  Administered 2024-03-04: 2 via ORAL
  Filled 2024-03-04: qty 2

## 2024-03-04 MED ORDER — DOCUSATE SODIUM 100 MG PO CAPS: 100.0000 mg | ORAL_CAPSULE | Freq: Two times a day (BID) | ORAL | 0 refills | Status: DC

## 2024-03-04 NOTE — ED Provider Notes (Signed)
 [place] [facility]PARTMENT AT [facility] Provider Note   CSN: [id number] Arrival date & time: 03/03/24  2345     History  Chief Complaint  Patient presents with   Abdominal Pain    Monica [name] is a 83 y.o. female.  83 year old female presents the ER with bilateral lower abdominal pain.  Patient states she has a history of diverticulitis and feels exactly the same.  She states has been going on for a few days progressively worsening.  She went to a doctor this morning who told her that they did not think it was diverticulitis.  She had been being treated for UTI previously and the doctor this morning did switch her to Augmentin.  Patient states the pain worsened so she came here for further evaluation.  No emesis but she has had nausea.  Pain does not radiate anywhere.  She has not had a bowel movement in a few days but is also not remember passing gas in few days states she has been burping some.  No fevers at home.  No other associate symptoms.  No previous surgeries.   Abdominal Pain      Home Medications Prior to Admission medications   Medication Sig Start Date End Date Taking? Authorizing Provider  docusate sodium  (COLACE) 100 MG capsule Take 1 capsule (100 mg total) by mouth every 12 (twelve) hours. 03/04/24  Yes Namiyah Grantham, Reymundo Caulk, MD  ondansetron  (ZOFRAN -ODT) 4 MG disintegrating tablet 4mg  ODT q4 hours prn nausea/vomit 03/04/24  Yes Selicia Windom, Reymundo Caulk, MD  oxyCODONE -acetaminophen  (PERCOCET) 5-325 MG tablet Take 1 tablet by mouth every 6 (six) hours as needed for severe pain (pain score 7-10). 03/04/24  Yes Zakhi Dupre, Reymundo Caulk, MD  oxyCODONE -acetaminophen  (PERCOCET/ROXICET) 5-325 MG tablet Take 1 tablet by mouth every 6 (six) hours as needed for severe pain (pain score 7-10). 03/04/24  Yes Semaj Coburn, Reymundo Caulk, MD  ACCU-CHEK GUIDE TEST test strip TEST TWICE DAILY E11.65 09/13/23   [provider]  Accu-Chek Softclix Lancets lancets 2 (two) times daily. 09/13/23   [provider]  albuterol  (VENTOLIN  HFA) 108 (90 Base) MCG/ACT inhaler Inhale 2 puffs into the lungs 4 (four) times daily. 08/13/23   [provider]  amLODipine  (NORVASC ) 5 MG tablet TAKE 1 TABLET BY MOUTH DAILY 07/18/23   Laurann Pollock, MD  apixaban  (ELIQUIS ) 5 MG TABS tablet TAKE 1 TABLET BY MOUTH TWICE  DAILY 01/24/24   Laurann Pollock, MD  Blood Glucose Monitoring Suppl (ACCU-CHEK GUIDE ME) w/Device KIT TEST TWICE DAILY E11.65 09/13/23   [provider]  cholecalciferol (VITAMIN D3) 25 MCG (1000 UNIT) tablet Take 1,000 Units by mouth daily.    [provider]  Cranberry 125 MG TABS Take by mouth.    [provider]  ezetimibe (ZETIA) 10 MG tablet Take 10 mg by mouth daily. 03/03/23   [provider]  ferrous sulfate 325 (65 FE) MG tablet Take 325 mg by mouth daily with breakfast.    [provider]  losartan  (COZAAR ) 50 MG tablet Take 50 mg by mouth daily.    [provider]  Metoprolol  Tartrate 37.5 MG TABS TAKE 1 TABLET BY MOUTH TWICE  DAILY 12/20/23   Laurann Pollock, MD  Multiple Vitamin (MULTIVITAMIN WITH MINERALS) TABS tablet Take 1 tablet by mouth daily.    [provider]  nitroGLYCERIN  (NITROSTAT ) 0.4 MG SL tablet Place 1 tablet (0.4 mg total) under the tongue every 5 (five) minutes x 3 doses as needed for chest  pain (If no relief after 3rd dose, GO TO ED). 02/08/23 10/12/23  Lasalle Pointer, NP  pantoprazole  (PROTONIX ) 40 MG tablet Take 1 tablet (40 mg total) by mouth daily. 01/14/21   Rourk, Windsor Hatcher, MD  pioglitazone  (ACTOS ) 15 MG tablet Take 15 mg by mouth daily.    [provider]      Allergies    Elemental sulfur     Review of Systems   Review of Systems  Gastrointestinal:  Positive for abdominal pain.    Physical Exam Updated Vital Signs BP 129/68   Pulse 86   Temp 98.9 F (37.2 C) (Oral)   Resp 18   Ht 5\' 6"  (1.676 m)   Wt 78.9 kg   SpO2 93%   BMI 28.08 kg/m  Physical  Exam Vitals and nursing note reviewed.  Constitutional:      Appearance: She is well-developed.  HENT:     Head: Normocephalic and atraumatic.  Cardiovascular:     Rate and Rhythm: Normal rate and regular rhythm.  Pulmonary:     Effort: No respiratory distress.     Breath sounds: No stridor.  Abdominal:     General: There is no distension.     Tenderness: There is abdominal tenderness in the right lower quadrant, suprapubic area and left lower quadrant.  Musculoskeletal:     Cervical back: Normal range of motion.  Neurological:     Mental Status: She is alert.     ED Results / Procedures / Treatments   Labs (all labs ordered are listed, but only abnormal results are displayed) Labs Reviewed  CBC WITH DIFFERENTIAL/PLATELET - Abnormal; Notable for the following components:      Result Value   WBC 11.7 (*)    Neutro Abs 8.9 (*)    Monocytes Absolute 1.2 (*)    All other components within normal limits  COMPREHENSIVE METABOLIC PANEL WITH GFR - Abnormal; Notable for the following components:   Sodium 132 (*)    Glucose, Bld 201 (*)    Calcium 8.7 (*)    Albumin  3.3 (*)    All other components within normal limits  URINALYSIS, W/ REFLEX TO CULTURE (INFECTION SUSPECTED) - Abnormal; Notable for the following components:   Specific Gravity, Urine >1.046 (*)    All other components within normal limits  URINE CULTURE  LIPASE, BLOOD    EKG None  Radiology CT ABDOMEN PELVIS W CONTRAST Result Date: 03/04/2024 CLINICAL DATA:  Acute nonlocalized abdominal pain, lower bilateral abdominal pain beginning Friday. History of diverticulitis. Feels similar. EXAM: CT ABDOMEN AND PELVIS WITH CONTRAST TECHNIQUE: Multidetector CT imaging of the abdomen and pelvis was performed using the standard protocol following bolus administration of intravenous contrast. RADIATION DOSE REDUCTION: This exam was performed according to the departmental dose-optimization program which includes automated  exposure control, adjustment of the mA and/or kV according to patient size and/or use of iterative reconstruction technique. CONTRAST:  OMNIPAQUE  IOHEXOL  300 MG/ML  SOLN COMPARISON:  CT 01/23/2023 FINDINGS: Lower chest: No acute abnormality. Hepatobiliary: Hepatic steatosis. Normal gallbladder. No biliary dilation. Pancreas: Atrophic. Parenchymal calcifications in the body and tail compatible with sequela of chronic pancreatitis. No acute inflammation. Spleen: Unremarkable. Adrenals/Urinary Tract: Stable adrenal glands. Bilateral cortical renal scarring. Cyst in the anterior right kidney. No follow-up recommended. Bladder wall thickening with mucosal hyperenhancement. Mild perivesical stranding. There is gas within the bladder lumen and bladder wall. Stomach/Bowel: Normal caliber large and small bowel. Thickening of the sigmoid colon in the area of extensive  sigmoid diverticulosis. Adjacent inflammatory stranding and trace free fluid. No abscess. Question free air about the bladder versus is intramural gas in the bladder wall. Large hiatal hernia with intrathoracic stomach. Vascular/Lymphatic: Advanced aortic atherosclerosis. No lymphadenopathy. Reproductive: Hysterectomy.  No adnexal mass. Other: Stranding and free fluid in the pelvis greatest between the dome of the bladder and sigmoid colon. Question a few locules of free air about the bladder versus intramural air in the bladder wall. No abscess. Musculoskeletal: No acute fracture. IMPRESSION: 1. Acute sigmoid diverticulitis. Query a few locules of free gas about the bladder versus intramural gas in the bladder wall. No abscess. 2. Emphysematous cystitis. There is inflammatory stranding and fluid between the sigmoid colon dome of the bladder. No definite colovesical fistula. 3. Hepatic steatosis. 4. Large hiatal hernia with intrathoracic stomach. 5. Aortic Atherosclerosis (ICD10-I70.0). Electronically Signed   By: Rozell Cornet M.D.   On: 03/04/2024  01:34    Procedures Procedures    Medications Ordered in ED Medications  fentaNYL  (SUBLIMAZE ) injection 50 mcg (50 mcg Intravenous Given 03/04/24 0042)  lactated ringers  bolus 1,000 mL (0 mLs Intravenous Stopped 03/04/24 0234)  ondansetron  (ZOFRAN ) injection 4 mg (4 mg Intravenous Given 03/04/24 0042)  iohexol  (OMNIPAQUE ) 300 MG/ML solution 100 mL (100 mLs Intravenous Contrast Given 03/04/24 0108)  HYDROmorphone  (DILAUDID ) injection 0.5 mg (0.5 mg Intravenous Given 03/04/24 0253)  oxyCODONE -acetaminophen  (PERCOCET/ROXICET) 5-325 MG per tablet 2 tablet (2 tablets Oral Given 03/04/24 1914)    ED Course/ Medical Decision Making/ A&P                                 Medical Decision Making Amount and/or Complexity of Data Reviewed Labs: ordered. Radiology: ordered.  Risk OTC drugs. Prescription drug management.  Eval for UTI, urinary retention, bowel obstruction versus diverticulitis or other acute abdominal pathology.  Pain meds provided.  Will start some fluids and give her some nausea meds as well. Patient's pain is resolved.  She is tolerating p.o., drinking ginger ale without any difficulty.  No fevers.  CT scan as above with diverticulitis.  She has some emphysematous changes of her urinary bladder but she did recently have a UTI so suspect is probably related to that.  No evidence of abscess or perforation.  She is already on the correct antibiotic.  Short course of pain meds and stool softeners given.  Observed for period of time and no recurrence of pain.  Patient requesting discharge.  Will follow-up with PCP in a few days to make sure she is improving will return here for any new or worsening symptoms.  Final Clinical Impression(s) / ED Diagnoses Final diagnoses:  Diverticulitis    Rx / DC Orders ED Discharge Orders          Ordered    oxyCODONE -acetaminophen  (PERCOCET) 5-325 MG tablet  Every 6 hours PRN        03/04/24 0508    oxyCODONE -acetaminophen  (PERCOCET/ROXICET) 5-325 MG  tablet  Every 6 hours PRN        03/04/24 0508    docusate sodium  (COLACE) 100 MG capsule  Every 12 hours        03/04/24 0508    ondansetron  (ZOFRAN -ODT) 4 MG disintegrating tablet        03/04/24 0508              Antawn Sison, Reymundo Caulk, MD 03/04/24 [id number]

## 2024-03-04 NOTE — ED Triage Notes (Signed)
 Pt to ED c/o lower bilateral abdominal pain, beginning Friday, was seen by PCP earlier today and told if pain gets worse to come to ED. Hx of diverticulitis and states pain feels similar. Nausea without vomiting. Denies diarrhea. 10/10 pain.

## 2024-03-04 NOTE — ED Notes (Signed)
 Patient transported to CT

## 2024-03-05 MED FILL — Oxycodone w/ Acetaminophen Tab 5-325 MG: ORAL | Qty: 6 | Status: AC

## 2024-03-06 LAB — URINE CULTURE: Culture: NO GROWTH

## 2024-03-08 ENCOUNTER — Ambulatory Visit (HOSPITAL_COMMUNITY)
Admission: RE | Admit: 2024-03-08 | Discharge: 2024-03-08 | Disposition: A | Discharge status: Home / Self Care | Payer: Medicare Other | Source: Ambulatory Visit | Attending: Oncology | Admitting: Oncology

## 2024-03-08 ENCOUNTER — Inpatient Hospital Stay: Payer: Medicare Other | Attending: Oncology

## 2024-03-08 DIAGNOSIS — Z85118 Personal history of other malignant neoplasm of bronchus and lung: Secondary | ICD-10-CM | POA: Diagnosis not present

## 2024-03-08 DIAGNOSIS — I2510 Atherosclerotic heart disease of native coronary artery without angina pectoris: Secondary | ICD-10-CM | POA: Diagnosis not present

## 2024-03-08 DIAGNOSIS — E611 Iron deficiency: Secondary | ICD-10-CM

## 2024-03-08 DIAGNOSIS — C3432 Malignant neoplasm of lower lobe, left bronchus or lung: Secondary | ICD-10-CM

## 2024-03-08 DIAGNOSIS — N281 Cyst of kidney, acquired: Secondary | ICD-10-CM | POA: Diagnosis not present

## 2024-03-08 DIAGNOSIS — K5792 Diverticulitis of intestine, part unspecified, without perforation or abscess without bleeding: Secondary | ICD-10-CM | POA: Diagnosis not present

## 2024-03-08 DIAGNOSIS — C3431 Malignant neoplasm of lower lobe, right bronchus or lung: Secondary | ICD-10-CM

## 2024-03-08 DIAGNOSIS — R5383 Other fatigue: Secondary | ICD-10-CM

## 2024-03-08 DIAGNOSIS — E559 Vitamin D deficiency, unspecified: Secondary | ICD-10-CM | POA: Insufficient documentation

## 2024-03-08 DIAGNOSIS — I480 Paroxysmal atrial fibrillation: Secondary | ICD-10-CM | POA: Diagnosis not present

## 2024-03-08 DIAGNOSIS — J449 Chronic obstructive pulmonary disease, unspecified: Secondary | ICD-10-CM | POA: Diagnosis not present

## 2024-03-08 DIAGNOSIS — E119 Type 2 diabetes mellitus without complications: Secondary | ICD-10-CM | POA: Diagnosis not present

## 2024-03-08 DIAGNOSIS — I10 Essential (primary) hypertension: Secondary | ICD-10-CM | POA: Diagnosis not present

## 2024-03-08 DIAGNOSIS — E7800 Pure hypercholesterolemia, unspecified: Secondary | ICD-10-CM | POA: Diagnosis not present

## 2024-03-08 DIAGNOSIS — Z9109 Other allergy status, other than to drugs and biological substances: Secondary | ICD-10-CM | POA: Diagnosis not present

## 2024-03-08 DIAGNOSIS — Z8572 Personal history of non-Hodgkin lymphomas: Secondary | ICD-10-CM | POA: Diagnosis not present

## 2024-03-08 DIAGNOSIS — K5900 Constipation, unspecified: Secondary | ICD-10-CM | POA: Diagnosis not present

## 2024-03-08 DIAGNOSIS — Z902 Acquired absence of lung [part of]: Secondary | ICD-10-CM | POA: Insufficient documentation

## 2024-03-08 DIAGNOSIS — F17210 Nicotine dependence, cigarettes, uncomplicated: Secondary | ICD-10-CM | POA: Insufficient documentation

## 2024-03-08 DIAGNOSIS — K219 Gastro-esophageal reflux disease without esophagitis: Secondary | ICD-10-CM | POA: Diagnosis not present

## 2024-03-08 DIAGNOSIS — C3490 Malignant neoplasm of unspecified part of unspecified bronchus or lung: Secondary | ICD-10-CM | POA: Diagnosis not present

## 2024-03-08 DIAGNOSIS — Z8711 Personal history of peptic ulcer disease: Secondary | ICD-10-CM | POA: Diagnosis not present

## 2024-03-08 DIAGNOSIS — R110 Nausea: Secondary | ICD-10-CM | POA: Diagnosis not present

## 2024-03-08 DIAGNOSIS — N3080 Other cystitis without hematuria: Secondary | ICD-10-CM | POA: Diagnosis not present

## 2024-03-08 DIAGNOSIS — Z7901 Long term (current) use of anticoagulants: Secondary | ICD-10-CM | POA: Diagnosis not present

## 2024-03-08 DIAGNOSIS — Z79899 Other long term (current) drug therapy: Secondary | ICD-10-CM | POA: Diagnosis not present

## 2024-03-08 DIAGNOSIS — Z8249 Family history of ischemic heart disease and other diseases of the circulatory system: Secondary | ICD-10-CM | POA: Diagnosis not present

## 2024-03-08 DIAGNOSIS — Z833 Family history of diabetes mellitus: Secondary | ICD-10-CM | POA: Diagnosis not present

## 2024-03-08 DIAGNOSIS — K5732 Diverticulitis of large intestine without perforation or abscess without bleeding: Secondary | ICD-10-CM | POA: Diagnosis not present

## 2024-03-08 DIAGNOSIS — M816 Localized osteoporosis [Lequesne]: Secondary | ICD-10-CM

## 2024-03-08 DIAGNOSIS — Z7984 Long term (current) use of oral hypoglycemic drugs: Secondary | ICD-10-CM | POA: Diagnosis not present

## 2024-03-08 LAB — IRON AND TIBC
Iron: 49 ug/dL (ref 28–170)
Saturation Ratios: 14 % (ref 10.4–31.8)
TIBC: 342 ug/dL (ref 250–450)
UIBC: 293 ug/dL

## 2024-03-08 LAB — CBC WITH DIFFERENTIAL/PLATELET
Abs Immature Granulocytes: 0.02 10*3/uL (ref 0.00–0.07)
Basophils Absolute: 0.1 10*3/uL (ref 0.0–0.1)
Basophils Relative: 1 %
Eosinophils Absolute: 0.1 10*3/uL (ref 0.0–0.5)
Eosinophils Relative: 1 %
HCT: 41.4 % (ref 36.0–46.0)
Hemoglobin: 13.4 g/dL (ref 12.0–15.0)
Immature Granulocytes: 0 %
Lymphocytes Relative: 33 %
Lymphs Abs: 2.2 10*3/uL (ref 0.7–4.0)
MCH: 29.3 pg (ref 26.0–34.0)
MCHC: 32.4 g/dL (ref 30.0–36.0)
MCV: 90.6 fL (ref 80.0–100.0)
Monocytes Absolute: 0.4 10*3/uL (ref 0.1–1.0)
Monocytes Relative: 6 %
Neutro Abs: 4 10*3/uL (ref 1.7–7.7)
Neutrophils Relative %: 59 %
Platelets: 312 10*3/uL (ref 150–400)
RBC: 4.57 MIL/uL (ref 3.87–5.11)
RDW: 14.2 % (ref 11.5–15.5)
WBC: 6.7 10*3/uL (ref 4.0–10.5)
nRBC: 0 % (ref 0.0–0.2)

## 2024-03-08 LAB — COMPREHENSIVE METABOLIC PANEL WITH GFR
ALT: 24 U/L (ref 0–44)
AST: 31 U/L (ref 15–41)
Albumin: 3.4 g/dL — ABNORMAL LOW (ref 3.5–5.0)
Alkaline Phosphatase: 77 U/L (ref 38–126)
Anion gap: 9 (ref 5–15)
BUN: 13 mg/dL (ref 8–23)
CO2: 28 mmol/L (ref 22–32)
Calcium: 9.5 mg/dL (ref 8.9–10.3)
Chloride: 99 mmol/L (ref 98–111)
Creatinine, Ser: 0.91 mg/dL (ref 0.44–1.00)
GFR, Estimated: >60 mL/min (ref >60)
Glucose, Bld: 189 mg/dL — ABNORMAL HIGH (ref 70–99)
Potassium: 4.4 mmol/L (ref 3.5–5.1)
Sodium: 136 mmol/L (ref 135–145)
Total Bilirubin: 0.5 mg/dL (ref 0.0–1.2)
Total Protein: 7.8 g/dL (ref 6.5–8.1)

## 2024-03-08 LAB — VITAMIN B12: Vitamin B-12: 1226 pg/mL — ABNORMAL HIGH (ref 180–914)

## 2024-03-08 LAB — FERRITIN: Ferritin: 111 ng/mL (ref 11–307)

## 2024-03-08 LAB — VITAMIN D 25 HYDROXY (VIT D DEFICIENCY, FRACTURES): Vit D, 25-Hydroxy: 51.72 ng/mL (ref 30–100)

## 2024-03-08 MED ORDER — IOHEXOL 300 MG/ML  SOLN
80.0000 mL | Freq: Once | INTRAMUSCULAR | Status: AC | PRN
Start: 2024-03-08 — End: 2024-03-08
  Administered 2024-03-08: 80 mL via INTRAVENOUS

## 2024-03-10 ENCOUNTER — Inpatient Hospital Stay (HOSPITAL_COMMUNITY)
Admission: EM | Admit: 2024-03-10 | Discharge: 2024-03-12 | DRG: 392 | Disposition: A | Discharge status: Home / Self Care | Attending: Internal Medicine | Admitting: Internal Medicine

## 2024-03-10 ENCOUNTER — Emergency Department (HOSPITAL_COMMUNITY)

## 2024-03-10 ENCOUNTER — Other Ambulatory Visit: Payer: Self-pay

## 2024-03-10 DIAGNOSIS — I2510 Atherosclerotic heart disease of native coronary artery without angina pectoris: Secondary | ICD-10-CM | POA: Diagnosis present

## 2024-03-10 DIAGNOSIS — Z9109 Other allergy status, other than to drugs and biological substances: Secondary | ICD-10-CM | POA: Diagnosis not present

## 2024-03-10 DIAGNOSIS — E785 Hyperlipidemia, unspecified: Secondary | ICD-10-CM | POA: Insufficient documentation

## 2024-03-10 DIAGNOSIS — Z90710 Acquired absence of both cervix and uterus: Secondary | ICD-10-CM | POA: Diagnosis not present

## 2024-03-10 DIAGNOSIS — Z8711 Personal history of peptic ulcer disease: Secondary | ICD-10-CM

## 2024-03-10 DIAGNOSIS — Z85118 Personal history of other malignant neoplasm of bronchus and lung: Secondary | ICD-10-CM

## 2024-03-10 DIAGNOSIS — E119 Type 2 diabetes mellitus without complications: Secondary | ICD-10-CM | POA: Diagnosis present

## 2024-03-10 DIAGNOSIS — K5900 Constipation, unspecified: Secondary | ICD-10-CM | POA: Diagnosis present

## 2024-03-10 DIAGNOSIS — R110 Nausea: Secondary | ICD-10-CM | POA: Diagnosis present

## 2024-03-10 DIAGNOSIS — Z8572 Personal history of non-Hodgkin lymphomas: Secondary | ICD-10-CM | POA: Diagnosis not present

## 2024-03-10 DIAGNOSIS — I10 Essential (primary) hypertension: Secondary | ICD-10-CM | POA: Diagnosis not present

## 2024-03-10 DIAGNOSIS — E7800 Pure hypercholesterolemia, unspecified: Secondary | ICD-10-CM | POA: Diagnosis present

## 2024-03-10 DIAGNOSIS — J449 Chronic obstructive pulmonary disease, unspecified: Secondary | ICD-10-CM | POA: Diagnosis present

## 2024-03-10 DIAGNOSIS — K219 Gastro-esophageal reflux disease without esophagitis: Secondary | ICD-10-CM | POA: Diagnosis present

## 2024-03-10 DIAGNOSIS — Z7984 Long term (current) use of oral hypoglycemic drugs: Secondary | ICD-10-CM

## 2024-03-10 DIAGNOSIS — Z79899 Other long term (current) drug therapy: Secondary | ICD-10-CM

## 2024-03-10 DIAGNOSIS — N281 Cyst of kidney, acquired: Secondary | ICD-10-CM | POA: Diagnosis not present

## 2024-03-10 DIAGNOSIS — K5792 Diverticulitis of intestine, part unspecified, without perforation or abscess without bleeding: Principal | ICD-10-CM | POA: Diagnosis present

## 2024-03-10 DIAGNOSIS — Z7901 Long term (current) use of anticoagulants: Secondary | ICD-10-CM

## 2024-03-10 DIAGNOSIS — Z833 Family history of diabetes mellitus: Secondary | ICD-10-CM | POA: Diagnosis not present

## 2024-03-10 DIAGNOSIS — I480 Paroxysmal atrial fibrillation: Secondary | ICD-10-CM | POA: Diagnosis not present

## 2024-03-10 DIAGNOSIS — C3490 Malignant neoplasm of unspecified part of unspecified bronchus or lung: Secondary | ICD-10-CM | POA: Diagnosis not present

## 2024-03-10 DIAGNOSIS — N3080 Other cystitis without hematuria: Secondary | ICD-10-CM | POA: Diagnosis present

## 2024-03-10 DIAGNOSIS — Z8249 Family history of ischemic heart disease and other diseases of the circulatory system: Secondary | ICD-10-CM

## 2024-03-10 DIAGNOSIS — K5732 Diverticulitis of large intestine without perforation or abscess without bleeding: Principal | ICD-10-CM | POA: Diagnosis present

## 2024-03-10 LAB — URINALYSIS, ROUTINE W REFLEX MICROSCOPIC
Bilirubin Urine: NEGATIVE
Glucose, UA: NEGATIVE mg/dL
Hgb urine dipstick: NEGATIVE
Ketones, ur: NEGATIVE mg/dL
Leukocytes,Ua: NEGATIVE
Nitrite: NEGATIVE
Protein, ur: NEGATIVE mg/dL
Specific Gravity, Urine: 1.005 (ref 1.005–1.030)
pH: 6 (ref 5.0–8.0)

## 2024-03-10 LAB — CBC
HCT: 40.8 % (ref 36.0–46.0)
Hemoglobin: 13.7 g/dL (ref 12.0–15.0)
MCH: 29.8 pg (ref 26.0–34.0)
MCHC: 33.6 g/dL (ref 30.0–36.0)
MCV: 88.9 fL (ref 80.0–100.0)
Platelets: 313 10*3/uL (ref 150–400)
RBC: 4.59 MIL/uL (ref 3.87–5.11)
RDW: 14.3 % (ref 11.5–15.5)
WBC: 11.2 10*3/uL — ABNORMAL HIGH (ref 4.0–10.5)
nRBC: 0 % (ref 0.0–0.2)

## 2024-03-10 LAB — COMPREHENSIVE METABOLIC PANEL WITH GFR
ALT: 19 U/L (ref 0–44)
AST: 19 U/L (ref 15–41)
Albumin: 3.5 g/dL (ref 3.5–5.0)
Alkaline Phosphatase: 75 U/L (ref 38–126)
Anion gap: 11 (ref 5–15)
BUN: 12 mg/dL (ref 8–23)
CO2: 27 mmol/L (ref 22–32)
Calcium: 9.5 mg/dL (ref 8.9–10.3)
Chloride: 97 mmol/L — ABNORMAL LOW (ref 98–111)
Creatinine, Ser: 0.93 mg/dL (ref 0.44–1.00)
GFR, Estimated: >60 mL/min (ref >60)
Glucose, Bld: 182 mg/dL — ABNORMAL HIGH (ref 70–99)
Potassium: 4.5 mmol/L (ref 3.5–5.1)
Sodium: 135 mmol/L (ref 135–145)
Total Bilirubin: 0.6 mg/dL (ref 0.0–1.2)
Total Protein: 7.6 g/dL (ref 6.5–8.1)

## 2024-03-10 LAB — GLUCOSE, CAPILLARY: Glucose-Capillary: 130 mg/dL — ABNORMAL HIGH (ref 70–99)

## 2024-03-10 LAB — LIPASE, BLOOD: Lipase: 19 U/L (ref 11–51)

## 2024-03-10 MED ORDER — MORPHINE SULFATE (PF) 2 MG/ML IV SOLN
2.0000 mg | INTRAVENOUS | Status: DC | PRN
  Administered 2024-03-11: 2 mg via INTRAVENOUS
  Filled 2024-03-10: qty 1

## 2024-03-10 MED ORDER — METRONIDAZOLE 500 MG/100ML IV SOLN
500.0000 mg | Freq: Two times a day (BID) | INTRAVENOUS | Status: DC
Start: 2024-03-11 — End: 2024-03-12
  Administered 2024-03-11 – 2024-03-12 (×3): 500 mg via INTRAVENOUS
  Filled 2024-03-10 (×3): qty 100

## 2024-03-10 MED ORDER — LACTATED RINGERS IV BOLUS
1000.0000 mL | Freq: Once | INTRAVENOUS | Status: AC
  Administered 2024-03-10: 1000 mL via INTRAVENOUS

## 2024-03-10 MED ORDER — EZETIMIBE 10 MG PO TABS
10.0000 mg | ORAL_TABLET | Freq: Every day | ORAL | Status: DC
  Administered 2024-03-10 – 2024-03-11 (×2): 10 mg via ORAL
  Filled 2024-03-10 (×2): qty 1

## 2024-03-10 MED ORDER — LACTATED RINGERS IV SOLN: INTRAVENOUS | Status: AC

## 2024-03-10 MED ORDER — ADULT MULTIVITAMIN W/MINERALS CH
1.0000 | ORAL_TABLET | Freq: Every day | ORAL | Status: DC
  Administered 2024-03-11 – 2024-03-12 (×2): 1 via ORAL
  Filled 2024-03-10 (×2): qty 1

## 2024-03-10 MED ORDER — POLYETHYLENE GLYCOL 3350 17 G PO PACK: 17.0000 g | PACK | Freq: Every day | ORAL | Status: DC | PRN

## 2024-03-10 MED ORDER — INSULIN ASPART 100 UNIT/ML IJ SOLN
0.0000 [IU] | Freq: Three times a day (TID) | INTRAMUSCULAR | Status: DC
  Administered 2024-03-11: 3 [IU] via SUBCUTANEOUS
  Administered 2024-03-11 – 2024-03-12 (×3): 2 [IU] via SUBCUTANEOUS

## 2024-03-10 MED ORDER — HYDROCODONE-ACETAMINOPHEN 5-325 MG PO TABS
1.0000 | ORAL_TABLET | Freq: Once | ORAL | Status: AC
  Administered 2024-03-10: 1 via ORAL
  Filled 2024-03-10: qty 1

## 2024-03-10 MED ORDER — ONDANSETRON HCL 4 MG/2ML IJ SOLN
4.0000 mg | Freq: Once | INTRAMUSCULAR | Status: AC
  Administered 2024-03-10: 4 mg via INTRAVENOUS
  Filled 2024-03-10: qty 2

## 2024-03-10 MED ORDER — MORPHINE SULFATE (PF) 4 MG/ML IV SOLN
4.0000 mg | Freq: Once | INTRAVENOUS | Status: AC
  Administered 2024-03-10: 4 mg via INTRAVENOUS
  Filled 2024-03-10: qty 1

## 2024-03-10 MED ORDER — HYDRALAZINE HCL 20 MG/ML IJ SOLN: 5.0000 mg | INTRAMUSCULAR | Status: DC | PRN

## 2024-03-10 MED ORDER — CRANBERRY 125 MG PO TABS: 1.0000 | ORAL_TABLET | Freq: Every day | ORAL | Status: DC

## 2024-03-10 MED ORDER — SENNA 8.6 MG PO TABS
1.0000 | ORAL_TABLET | Freq: Two times a day (BID) | ORAL | Status: DC
Start: 2024-03-10 — End: 2024-03-10

## 2024-03-10 MED ORDER — APIXABAN 5 MG PO TABS
5.0000 mg | ORAL_TABLET | Freq: Two times a day (BID) | ORAL | Status: DC
  Administered 2024-03-10 – 2024-03-12 (×4): 5 mg via ORAL
  Filled 2024-03-10 (×4): qty 1

## 2024-03-10 MED ORDER — SODIUM CHLORIDE 0.9 % IV SOLN: INTRAVENOUS | Status: DC

## 2024-03-10 MED ORDER — SODIUM CHLORIDE 0.9 % IV SOLN
2.0000 g | INTRAVENOUS | Status: DC
  Administered 2024-03-11: 2 g via INTRAVENOUS
  Filled 2024-03-10: qty 20

## 2024-03-10 MED ORDER — INSULIN ASPART 100 UNIT/ML IJ SOLN: 0.0000 [IU] | Freq: Every day | INTRAMUSCULAR | Status: DC

## 2024-03-10 MED ORDER — METOPROLOL TARTRATE 25 MG PO TABS
37.5000 mg | ORAL_TABLET | Freq: Two times a day (BID) | ORAL | Status: DC
  Administered 2024-03-10 – 2024-03-12 (×4): 37.5 mg via ORAL
  Filled 2024-03-10 (×4): qty 2

## 2024-03-10 MED ORDER — IOHEXOL 300 MG/ML  SOLN
100.0000 mL | Freq: Once | INTRAMUSCULAR | Status: AC | PRN
  Administered 2024-03-10: 100 mL via INTRAVENOUS

## 2024-03-10 MED ORDER — NITROGLYCERIN 0.4 MG SL SUBL: 0.4000 mg | SUBLINGUAL_TABLET | SUBLINGUAL | Status: DC | PRN

## 2024-03-10 MED ORDER — PANTOPRAZOLE SODIUM 40 MG PO TBEC
40.0000 mg | DELAYED_RELEASE_TABLET | Freq: Every day | ORAL | Status: DC
Start: 2024-03-11 — End: 2024-03-12
  Administered 2024-03-11 – 2024-03-12 (×2): 40 mg via ORAL
  Filled 2024-03-10 (×2): qty 1

## 2024-03-10 MED ORDER — POLYETHYLENE GLYCOL 3350 17 G PO PACK: 17.0000 g | PACK | Freq: Every day | ORAL | Status: DC

## 2024-03-10 MED ORDER — METRONIDAZOLE 500 MG/100ML IV SOLN
500.0000 mg | Freq: Once | INTRAVENOUS | Status: AC
  Administered 2024-03-10: 500 mg via INTRAVENOUS
  Filled 2024-03-10: qty 100

## 2024-03-10 MED ORDER — AMLODIPINE BESYLATE 5 MG PO TABS: 5.0000 mg | ORAL_TABLET | Freq: Every day | ORAL | Status: DC

## 2024-03-10 MED ORDER — LOSARTAN POTASSIUM 25 MG PO TABS: 50.0000 mg | ORAL_TABLET | Freq: Every day | ORAL | Status: DC

## 2024-03-10 MED ORDER — SODIUM CHLORIDE 0.9 % IV SOLN
2.0000 g | Freq: Once | INTRAVENOUS | Status: AC
  Administered 2024-03-10: 2 g via INTRAVENOUS
  Filled 2024-03-10: qty 20

## 2024-03-10 MED ORDER — OXYCODONE-ACETAMINOPHEN 5-325 MG PO TABS
1.0000 | ORAL_TABLET | Freq: Four times a day (QID) | ORAL | Status: DC | PRN
  Administered 2024-03-11 – 2024-03-12 (×2): 1 via ORAL
  Filled 2024-03-10 (×4): qty 1

## 2024-03-10 MED ORDER — ALBUTEROL SULFATE (2.5 MG/3ML) 0.083% IN NEBU: 2.5000 mg | INHALATION_SOLUTION | RESPIRATORY_TRACT | Status: DC | PRN

## 2024-03-10 NOTE — ED Triage Notes (Signed)
 Pt states she is not any better from medication for diverticulitis.  C/o constipation, LBM an hour ago-diarrhea with blood.  Took two suppositories yesterday and enema today.  + nausea, denies emesis.

## 2024-03-10 NOTE — ED Provider Notes (Signed)
 [place] EMERGENCY DEPARTMENT AT [facility] Provider Note  CSN: [id number] Arrival date & time: 03/10/24 1353  Chief Complaint(s) Abdominal Pain  HPI Monica [name] is a 83 y.o. female with PMH gastric MALT lymphoma, T2DM, diverticulitis who presents emergency room for evaluation of abdominal pain.  Patient was seen on 03/03/2024 a had a CT showing diverticulitis.  Patient was reportedly already taking Augmentin at that time and has completed this antibiotic course but her symptoms have not improved.  States has not had bowel movement in 8 days.  Endorsing persistent left lower quadrant pain and nausea but denies vomiting, chest pain, shortness of breath, headache, fever or other systemic symptoms.   Past Medical History Past Medical History:  Diagnosis Date   Cancer (HCC)    NHL, Malt Lymphoma   Depression    Diverticulitis 04/28/2020   DM (diabetes mellitus) (HCC)    TYPE  2   Dysphagia    Dysrhythmia    History of palpatations   GERD (gastroesophageal reflux disease)    INGESTION    Heart palpitations    Hiatal hernia    Hyperlipidemia    Interstitial cystitis    Iron deficiency 04/28/2020   Lung cancer, lower lobe (HCC) 07/21/2018   Left lower lobe, stage Ia squamous cell carcinoma   MALT (mucosa associated lymphoid tissue)    gastric   Sinusitis    Patient Active Problem List   Diagnosis Date Noted   GERD (gastroesophageal reflux disease)    Diarrhea 12/01/2020   Iron deficiency 04/28/2020   Diverticulitis 04/28/2020   S/P thoracotomy 09/10/2019   Change in bowel function 09/19/2018   S/P lobectomy of lung 05/19/2018   Lung cancer (HCC) 05/19/2018   Early satiety 05/02/2018   Colon cancer screening 05/02/2018   Flatulence/gas pain/belching 09/16/2017   Lower abdominal pain 09/16/2017   Nodule of left lung 01/07/2017   Nausea without vomiting 04/06/2016   Loss of weight 04/06/2016   MALToma 03/12/2016   Mucosal abnormality of stomach    Schatzki's  ring    Large hiatal hernia    HYPERCHOLESTEROLEMIA 09/02/2010   DEPRESSION, MILD 09/02/2010   TACHYCARDIA 09/02/2010   Dysphagia 09/02/2010   Home Medication(s) Prior to Admission medications   Medication Sig Start Date End Date Taking? Authorizing Provider  ACCU-CHEK GUIDE TEST test strip TEST TWICE DAILY E11.65 09/13/23   [provider]  Accu-Chek Softclix Lancets lancets 2 (two) times daily. 09/13/23   [provider]  albuterol  (VENTOLIN  HFA) 108 (90 Base) MCG/ACT inhaler Inhale 2 puffs into the lungs 4 (four) times daily. 08/13/23   [provider]  amLODipine  (NORVASC ) 5 MG tablet TAKE 1 TABLET BY MOUTH DAILY 07/18/23   Laurann Pollock, MD  apixaban  (ELIQUIS ) 5 MG TABS tablet TAKE 1 TABLET BY MOUTH TWICE  DAILY 01/24/24   Laurann Pollock, MD  Blood Glucose Monitoring Suppl (ACCU-CHEK GUIDE ME) w/Device KIT TEST TWICE DAILY E11.65 09/13/23   [provider]  cholecalciferol (VITAMIN D3) 25 MCG (1000 UNIT) tablet Take 1,000 Units by mouth daily.    [provider]  Cranberry 125 MG TABS Take by mouth.    [provider]  docusate sodium  (COLACE) 100 MG capsule Take 1 capsule (100 mg total) by mouth every 12 (twelve) hours. 03/04/24   Mesner, Jason, MD  ezetimibe (ZETIA) 10 MG tablet Take 10 mg by mouth daily. 03/03/23   [provider]  ferrous sulfate 325 (65 FE) MG tablet Take 325  mg by mouth daily with breakfast.    [provider]  losartan  (COZAAR ) 50 MG tablet Take 50 mg by mouth daily.    [provider]  Metoprolol  Tartrate 37.5 MG TABS TAKE 1 TABLET BY MOUTH TWICE  DAILY 12/20/23   Laurann Pollock, MD  Multiple Vitamin (MULTIVITAMIN WITH MINERALS) TABS tablet Take 1 tablet by mouth daily.    [provider]  nitroGLYCERIN  (NITROSTAT ) 0.4 MG SL tablet Place 1 tablet (0.4 mg total) under the tongue every 5 (five) minutes x 3 doses as needed for chest pain (If no relief after 3rd dose, GO  TO ED). 02/08/23 10/12/23  Lasalle Pointer, NP  ondansetron  (ZOFRAN -ODT) 4 MG disintegrating tablet 4mg  ODT q4 hours prn nausea/vomit 03/04/24   Mesner, Reymundo Caulk, MD  oxyCODONE -acetaminophen  (PERCOCET) 5-325 MG tablet Take 1 tablet by mouth every 6 (six) hours as needed for severe pain (pain score 7-10). 03/04/24   Mesner, Reymundo Caulk, MD  oxyCODONE -acetaminophen  (PERCOCET/ROXICET) 5-325 MG tablet Take 1 tablet by mouth every 6 (six) hours as needed for severe pain (pain score 7-10). 03/04/24   Mesner, Reymundo Caulk, MD  pantoprazole  (PROTONIX ) 40 MG tablet Take 1 tablet (40 mg total) by mouth daily. 01/14/21   Rourk, Windsor Hatcher, MD  pioglitazone  (ACTOS ) 15 MG tablet Take 15 mg by mouth daily.    [provider]                                                                                                                                    Past Surgical History Past Surgical History:  Procedure Laterality Date   ABDOMINAL HYSTERECTOMY     BIOPSY  09/01/2016   Procedure: BIOPSY;  Surgeon: Suzette Espy, MD;  Location: [facility];  Service: Endoscopy;;  gastric   BIOPSY  11/29/2018   Procedure: BIOPSY;  Surgeon: Suzette Espy, MD;  [facility]: [facility];  Service: Endoscopy;;  right colon   BLADDER SURGERY     CATARACT EXTRACTION W/ INTRAOCULAR LENS  IMPLANT, BILATERAL  2015   CATARACT EXTRACTION, BILATERAL     COLONOSCOPY     Dr. Florette Hurry 2009: Normal per PCP notes   COLONOSCOPY N/A 11/29/2018   Rourk: Diverticulosis, 3 polyps removed.  No evidence of colitis.  Tubular adenomas.  No future surveillance colonoscopies recommended due to age.   ESOPHAGOGASTRODUODENOSCOPY     RMR: Prominant Schzgzki ring/component of peptic stricture status post dilation and disruption as described above, otherwise norma esophagus, moderate-sized hiatal hernia, antal pyloric channel, and posterier bulbar erosions, otherwise unremarkable stomach, D1 and D2 . Inflammatory findings on the stomach and duodenum will likely be  related to aspirin  effect. We need to rule out Helicobacter pylor   ESOPHAGOGASTRODUODENOSCOPY N/A 03/10/2015   Dr. 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 Pathology with lymphoid population of stomach, slight atypia   ESOPHAGOGASTRODUODENOSCOPY N/A 02/03/2016   Dr.  Rourk: Schatzki ring noted at [employer], dilated with 56 and then 58 [place] Maloney dilator. Large hiatal hernia. 6 x 7 cm nodular geographically ulcerated mucosa, biopsy c/w MALToma   ESOPHAGOGASTRODUODENOSCOPY N/A 04/08/2016   Dr. Riley Cheadle: moderate Schatzki's ring/web s/p dilation, large hiatal hernia, localized area of gastric lymphoma visualized and appeared to be much improved. normal second portion of the duodenum. No specimens collected. Esophageal lumen notably tighted up significantly since her dilation in April of this year.    ESOPHAGOGASTRODUODENOSCOPY N/A 08/04/2016   Procedure: ESOPHAGOGASTRODUODENOSCOPY (EGD);  Surgeon: Suzette Espy, MD;  [facility]: [facility];  Service: Endoscopy;  Laterality: N/A;  7:30 am   ESOPHAGOGASTRODUODENOSCOPY N/A 09/01/2016   Procedure: ESOPHAGOGASTRODUODENOSCOPY (EGD);  Surgeon: Suzette Espy, MD;  [facility]: [facility];  Service: Endoscopy;  Laterality: N/A;  830    ESOPHAGOGASTRODUODENOSCOPY N/A 05/10/2017   Dr. Riley Cheadle: Moderate Schatzki ring at the GE junction, status post dilation with 54 [place].  Medium sized hiatal hernia.  Few localized erosions in the gastric antrum.  Stomach biopsy showed chronic gastritis, no H. pylori.  No atypical lymphoid infiltrates or other features of lymphoproliferative process   ESOPHAGOGASTRODUODENOSCOPY N/A 06/13/2018   Dr. Riley Cheadle: Schatzki ring status post dilation.  Large hiatal hernia.  Focal area 4 x 4 cm along the greater curvature, somewhat erythematous and thickened mucosa, biopsy benign.  Next EGD in August 2020.   ESOPHAGOGASTRODUODENOSCOPY N/A 01/14/2021   Procedure:  ESOPHAGOGASTRODUODENOSCOPY (EGD);  Surgeon: Suzette Espy, MD;  [facility];  Service: Endoscopy;  Laterality: N/A;  AM   ESOPHAGOGASTRODUODENOSCOPY (EGD) WITH PROPOFOL  N/A 06/16/2023   Procedure: ESOPHAGOGASTRODUODENOSCOPY (EGD) WITH PROPOFOL ;  Surgeon: Suzette Espy, MD;  [facility]: [facility];  Service: Endoscopy;  Laterality: N/A;  1:30 pm, asa 3   EYE SURGERY     INTERCOSTAL NERVE BLOCK Right 09/10/2019   Procedure: Intercostal Nerve Block;  Surgeon: Zelphia Higashi, MD;  Location: [facility] OR;  Service: Thoracic;  Laterality: Right;   LOBECTOMY Left 05/19/2018   Procedure: LEFT LOWER LOBECTOMY;  Surgeon: Zelphia Higashi, MD;  Location: [facility] OR;  Service: Thoracic;  Laterality: Left;   LYMPH NODE DISSECTION Right 09/10/2019   Procedure: Lymph Node Dissection;  Surgeon: Zelphia Higashi, MD;  Location: [facility] OR;  Service: Thoracic;  Laterality: Right;   MALONEY DILATION N/A 03/10/2015   Procedure: Dorothyann Gather;  Surgeon: Suzette Espy, MD;  [facility];  Service: Endoscopy;  Laterality: N/A;   MALONEY DILATION N/A 02/03/2016   Procedure: Londa Rival DILATION;  Surgeon: Suzette Espy, MD;  [facility]: [facility];  Service: Endoscopy;  Laterality: N/A;   MALONEY DILATION N/A 04/08/2016   Procedure: Londa Rival DILATION;  Surgeon: Suzette Espy, MD;  [facility];  Service: Endoscopy;  Laterality: N/A;   MALONEY DILATION N/A 05/10/2017   Procedure: Londa Rival DILATION;  Surgeon: Suzette Espy, MD;  [facility];  Service: Endoscopy;  Laterality: N/A;   MALONEY DILATION N/A 06/13/2018   Procedure: Londa Rival DILATION;  Surgeon: Suzette Espy, MD;  [facility];  Service: Endoscopy;  Laterality: N/A;   MALONEY DILATION N/A 01/14/2021   Procedure: Londa Rival DILATION;  Surgeon: Suzette Espy, MD;  [facility];  Service: Endoscopy;  Laterality: N/A;   MALONEY DILATION N/A 06/16/2023   Procedure: Londa Rival DILATION;  Surgeon: Suzette Espy, MD;  [facility];  Service: Endoscopy;  Laterality: N/A;   POLYPECTOMY  11/29/2018  Procedure: POLYPECTOMY;  Surgeon: Suzette Espy, MD;  [facility];  Service: Endoscopy;;   VIDEO ASSISTED THORACOSCOPY (VATS)/ LOBECTOMY Right 09/10/2019   Procedure: VIDEO ASSISTED THORACOSCOPY (VATS)/RIGHT LOWER LOBECTOMY;  Surgeon: Zelphia Higashi, MD;  Location: [facility] OR;  Service: Thoracic;  Laterality: Right;   VIDEO ASSISTED THORACOSCOPY (VATS)/WEDGE RESECTION Left 05/19/2018   Procedure: VIDEO ASSISTED THORACOSCOPY (VATS)/WEDGE RESECTION;  Surgeon: Zelphia Higashi, MD;  Location: [facility] OR;  Service: Thoracic;  Laterality: Left;   Family History Family History  Problem Relation Age of Onset   Heart attack Father    Dementia Father    Diabetes Sister    Diabetes Brother    Diabetes Sister    Colon cancer Neg Hx     Social History Social History   Tobacco Use   Smoking status: Some Days    Current packs/day: 0.00    Average packs/day: 0.5 packs/day for 57.0 years (28.5 ttl pk-yrs)    Types: Cigarettes    Start date: 08/01/1965    Last attempt to quit: 08/01/2022    Years since quitting: 1.6    Passive exposure: Never   Smokeless tobacco: Never  Vaping Use   Vaping status: Never Used  Substance Use Topics   Alcohol  use: No    Alcohol /week: 0.0 standard drinks of alcohol    Drug use: No   Allergies Elemental sulfur   Review of Systems Review of Systems  Gastrointestinal:  Positive for abdominal pain and nausea.    Physical Exam Vital Signs  I have reviewed the triage vital signs BP (!) 114/59 (BP Location: Right Arm)   Pulse 77   Temp 97.6 F (36.4 C) (Oral)   Resp 18   Ht 5\' 6"  (1.676 m)   Wt 78.9 kg   SpO2 96%   BMI 28.08 kg/m   Physical Exam Vitals and nursing note reviewed.  Constitutional:      General: She is not in acute distress.    Appearance: She is well-developed.  HENT:     Head: Normocephalic and atraumatic.  Eyes:      Conjunctiva/sclera: Conjunctivae normal.  Cardiovascular:     Rate and Rhythm: Normal rate and regular rhythm.     Heart sounds: No murmur heard. Pulmonary:     Effort: Pulmonary effort is normal. No respiratory distress.     Breath sounds: Normal breath sounds.  Abdominal:     Palpations: Abdomen is soft.     Tenderness: There is abdominal tenderness in the left lower quadrant.  Musculoskeletal:        General: No swelling.     Cervical back: Neck supple.  Skin:    General: Skin is warm and dry.     Capillary Refill: Capillary refill takes less than 2 seconds.  Neurological:     Mental Status: She is alert.  Psychiatric:        Mood and Affect: Mood normal.     ED Results and Treatments Labs (all labs ordered are listed, but only abnormal results are displayed) Labs Reviewed  LIPASE, BLOOD  COMPREHENSIVE METABOLIC PANEL WITH GFR  CBC  Radiology No results found.  Pertinent labs & imaging results that were available during my care of the patient were reviewed by me and considered in my medical decision making (see MDM for details).  Medications Ordered in ED Medications - No data to display                                                                                                                                   Procedures Procedures  (including critical care time)  Medical Decision Making / ED Course   This patient presents to the ED for concern of abdominal pain, this involves an extensive number of treatment options, and is a complaint that carries with it a high risk of complications and morbidity.  The differential diagnosis includes diverticulitis, epiploic appendagitis, colitis, gastroenteritis, constipation, nephrolithiasis, inflammatory bowel disease,   MDM: Patient seen emergency room for evaluation of abdominal pain.   Physical exam with tenderness in the left lower quadrant over the suprapubic region.  Laboratory evaluation with leukocytosis to 11.2 but is otherwise unremarkable.  CT abdomen pelvis showing persistent but improving diverticulitis and emphysematous cystitis.  On reevaluation, patient's pain is persistent and she is requiring multiple doses of pain medication to get her pain under control.  Will require hospital admission for persistent diverticulitis despite outpatient antibiotic therapy.  Patient admitted   Additional history obtained: -Additional history obtained from husband -External records from outside source obtained and reviewed including: Chart review including previous notes, labs, imaging, consultation notes   Lab Tests: -I ordered, reviewed, and interpreted labs.   The pertinent results include:   Labs Reviewed  LIPASE, BLOOD  COMPREHENSIVE METABOLIC PANEL WITH GFR  CBC       Imaging Studies ordered: I ordered imaging studies including CT abdomen pelvis I independently visualized and interpreted imaging. I agree with the radiologist interpretation   Medicines ordered and prescription drug management: No orders of the defined types were placed in this encounter.   -I have reviewed the patients home medicines and have made adjustments as needed  Critical interventions none    Cardiac Monitoring: The patient was maintained on a cardiac monitor.  I personally viewed and interpreted the cardiac monitored which showed an underlying rhythm of: NSR  Social Determinants of Health:  Factors impacting patients care include: none   Reevaluation: After the interventions noted above, I reevaluated the patient and found that they have :improved  Co morbidities that complicate the patient evaluation  Past Medical History:  Diagnosis Date   Cancer (HCC)    NHL, Malt Lymphoma   Depression    Diverticulitis 04/28/2020   DM (diabetes mellitus) (HCC)    TYPE  2    Dysphagia    Dysrhythmia    History of palpatations   GERD (gastroesophageal reflux disease)    INGESTION    Heart palpitations    Hiatal hernia    Hyperlipidemia    Interstitial cystitis  Iron deficiency 04/28/2020   Lung cancer, lower lobe (HCC) 07/21/2018   Left lower lobe, stage Ia squamous cell carcinoma   MALT (mucosa associated lymphoid tissue)    gastric   Sinusitis       Dispostion: I considered admission for this patient, and patient require hospital admission for persistent diverticulitis despite outpatient antibiotic therapy    Final Clinical Impression(s) / ED Diagnoses Final diagnoses:  None     @PCDICTATION @    Karlyn Overman, MD 03/10/24 2111

## 2024-03-10 NOTE — Progress Notes (Signed)
 PHARMACIST - PHYSICIAN ORDER COMMUNICATION  CONCERNING: P&T Medication Policy on Herbal Medications  DESCRIPTION:  This patient's order for:  cranberry tablets  has been noted.  This product(s) is classified as an "herbal" or natural product. Due to a lack of definitive safety studies or FDA approval, nonstandard manufacturing practices, plus the potential risk of unknown drug-drug interactions while on inpatient medications, the Pharmacy and Therapeutics Committee does not permit the use of "herbal" or natural products of this type within [facility].   ACTION TAKEN: The pharmacy department is unable to verify this order at this time and your patient has been informed of this safety policy. Please reevaluate patient's clinical condition at discharge and address if the herbal or natural product(s) should be resumed at that time.

## 2024-03-10 NOTE — H&P (Signed)
 [facility] H&P   Patient Demographics:    Monica [name], is a 83 y.o. female  MRN: [id number]   DOB - April 20, 1941  Admit Date - 03/10/2024  Outpatient Primary MD for the patient is Sasser, Ky Phillips, MD  Referring MD/NP/PA: Dr Jeannie Milo    Patient coming from: home  Chief Complaint  Patient presents with   Abdominal Pain      HPI:    Monica [name]  is a 83 y.o. female, past medical history of hypertension, hyperlipidemia, COPD, paroxysmal A-fib, history of  stage Ia squamous cell carcinoma 2019,  She had a left lower lobectomy for stage Ia squamous cell carcinoma in July 2019 and then underwent a thoracoscopic right lower lobectomy for stage Ib squamous cell carcinoma in November 2020.  gastric MALT lymphoma, GERD, depression, scad scissoring, hiatal hernia.  Diabetes mellitus type 2 - Patient presents to ED secondary to complaints of abdominal pain, patient had recent ED visit 03/03/2024, CT abdomen and pelvis showing diverticulitis, was prescribed Augmentin, patient still reports persistent pain, nausea, poor appetite, her symptoms has not much improved, as well she reports constipation (she required to take multiple enemas and suppositories yesterday which did cause her to start having diarrhea today) she denies fever, chest pain, shortness of breath. - In ED workup was significant for glucose 182, white blood cell count elevated at 11.2, she was afebrile, CT abdomen pelvis was significant for improving diverticulitis but still evident on imaging, as well emphysematous cystitis, requiring multiple doses of Zofran , IV morphine , she was started on IV antibiotics and [facility] consulted to admit.    Review of systems:      A full 10 point Review of Systems was done, except as stated above, all other Review of Systems were negative.   With Past History of the following :    Past Medical  History:  Diagnosis Date   Cancer (HCC)    NHL, Malt Lymphoma   Depression    Diverticulitis 04/28/2020   DM (diabetes mellitus) (HCC)    TYPE  2   Dysphagia    Dysrhythmia    History of palpatations   GERD (gastroesophageal reflux disease)    INGESTION    Heart palpitations    Hiatal hernia    Hyperlipidemia    Interstitial cystitis    Iron deficiency 04/28/2020   Lung cancer, lower lobe (HCC) 07/21/2018   Left lower lobe, stage Ia squamous cell carcinoma   MALT (mucosa associated lymphoid tissue)    gastric   Sinusitis       Past Surgical History:  Procedure Laterality Date   ABDOMINAL HYSTERECTOMY     BIOPSY  09/01/2016   Procedure: BIOPSY;  Surgeon: Suzette Espy, MD;  [facility];  Service: Endoscopy;;  gastric   BIOPSY  11/29/2018   Procedure: BIOPSY;  Surgeon: Suzette Espy, MD;  [facility];  Service: Endoscopy;;  right colon   BLADDER SURGERY     CATARACT EXTRACTION W/ INTRAOCULAR LENS  IMPLANT, BILATERAL  2015   CATARACT EXTRACTION, BILATERAL     COLONOSCOPY     Dr. Florette Hurry 2009: Normal per PCP notes   COLONOSCOPY N/A 11/29/2018   Rourk: Diverticulosis, 3 polyps removed.  No evidence of colitis.  Tubular adenomas.  No future surveillance colonoscopies recommended due to age.   ESOPHAGOGASTRODUODENOSCOPY     RMR: Prominant Schzgzki ring/component of peptic stricture status post dilation and disruption as described above, otherwise norma esophagus, moderate-sized hiatal hernia, antal pyloric channel, and posterier bulbar erosions, otherwise unremarkable stomach, D1 and D2 . Inflammatory findings on the stomach and duodenum will likely be related to aspirin  effect. We need to rule out Helicobacter pylor   ESOPHAGOGASTRODUODENOSCOPY N/A 03/10/2015   Dr. 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 Pathology with lymphoid population of stomach, slight atypia    ESOPHAGOGASTRODUODENOSCOPY N/A 02/03/2016   Dr. Riley Cheadle: Schatzki ring noted at GE junction, dilated with 56 and then 58 French Maloney dilator. Large hiatal hernia. 6 x 7 cm nodular geographically ulcerated mucosa, biopsy c/w MALToma   ESOPHAGOGASTRODUODENOSCOPY N/A 04/08/2016   Dr. Riley Cheadle: moderate Schatzki's ring/web s/p dilation, large hiatal hernia, localized area of gastric lymphoma visualized and appeared to be much improved. normal second portion of the duodenum. No specimens collected. Esophageal lumen notably tighted up significantly since her dilation in April of this year.    ESOPHAGOGASTRODUODENOSCOPY N/A 08/04/2016   Procedure: ESOPHAGOGASTRODUODENOSCOPY (EGD);  Surgeon: Suzette Espy, MD;  [facility];  Service: Endoscopy;  Laterality: N/A;  7:30 am   ESOPHAGOGASTRODUODENOSCOPY N/A 09/01/2016   Procedure: ESOPHAGOGASTRODUODENOSCOPY (EGD);  Surgeon: Suzette Espy, MD;  [facility];  Service: Endoscopy;  Laterality: N/A;  830    ESOPHAGOGASTRODUODENOSCOPY N/A 05/10/2017   Dr. Riley Cheadle: Moderate Schatzki ring at the GE junction, status post dilation with 54 [place].  Medium sized hiatal hernia.  Few localized erosions in the gastric antrum.  Stomach biopsy showed chronic gastritis, no H. pylori.  No atypical lymphoid infiltrates or other features of lymphoproliferative process   ESOPHAGOGASTRODUODENOSCOPY N/A 06/13/2018   Dr. Riley Cheadle: Schatzki ring status post dilation.  Large hiatal hernia.  Focal area 4 x 4 cm along the greater curvature, somewhat erythematous and thickened mucosa, biopsy benign.  Next EGD in August 2020.   ESOPHAGOGASTRODUODENOSCOPY N/A 01/14/2021   Procedure: ESOPHAGOGASTRODUODENOSCOPY (EGD);  Surgeon: Suzette Espy, MD;  [facility];  Service: Endoscopy;  Laterality: N/A;  AM   ESOPHAGOGASTRODUODENOSCOPY (EGD) WITH PROPOFOL  N/A 06/16/2023   Procedure: ESOPHAGOGASTRODUODENOSCOPY (EGD) WITH PROPOFOL ;  Surgeon: Suzette Espy, MD;  [facility];  Service: Endoscopy;  Laterality: N/A;  1:30 pm, asa 3   EYE SURGERY     INTERCOSTAL NERVE BLOCK Right 09/10/2019   Procedure: Intercostal Nerve Block;  Surgeon: Zelphia Higashi, MD;  Location: [facility] OR;  Service: Thoracic;  Laterality: Right;   LOBECTOMY Left 05/19/2018   Procedure: LEFT LOWER LOBECTOMY;  Surgeon: Zelphia Higashi, MD;  Location: [facility] OR;  Service: Thoracic;  Laterality: Left;   LYMPH NODE DISSECTION Right 09/10/2019   Procedure: Lymph Node Dissection;  Surgeon: Zelphia Higashi, MD;  Location: [facility] OR;  Service: Thoracic;  Laterality: Right;   MALONEY DILATION N/A 03/10/2015   Procedure: Dorothyann Gather;  Surgeon: Suzette Espy, MD;  [facility];  Service: Endoscopy;  Laterality: N/A;   MALONEY DILATION N/A 02/03/2016   Procedure: Londa Rival DILATION;  Surgeon: Suzette Espy, MD;  [facility];  Service: Endoscopy;  Laterality: N/A;   MALONEY DILATION N/A 04/08/2016   Procedure: Londa Rival DILATION;  Surgeon: Suzette Espy, MD;  [facility];  Service: Endoscopy;  Laterality: N/A;   MALONEY DILATION N/A 05/10/2017   Procedure: Londa Rival DILATION;  Surgeon: Suzette Espy, MD;  [facility];  Service: Endoscopy;  Laterality: N/A;   MALONEY DILATION N/A 06/13/2018   Procedure: Londa Rival DILATION;  Surgeon: Suzette Espy, MD;  [facility];  Service: Endoscopy;  Laterality: N/A;   MALONEY DILATION N/A 01/14/2021   Procedure: Londa Rival DILATION;  Surgeon: Suzette Espy, MD;  [facility];  Service: Endoscopy;  Laterality: N/A;   MALONEY DILATION N/A 06/16/2023   Procedure: Londa Rival DILATION;  Surgeon: Suzette Espy, MD;  [facility];  Service: Endoscopy;  Laterality: N/A;   POLYPECTOMY  11/29/2018   Procedure: POLYPECTOMY;  Surgeon: Suzette Espy, MD;  [facility];  Service: Endoscopy;;   VIDEO ASSISTED THORACOSCOPY (VATS)/ LOBECTOMY Right 09/10/2019   Procedure: VIDEO ASSISTED  THORACOSCOPY (VATS)/RIGHT LOWER LOBECTOMY;  Surgeon: Zelphia Higashi, MD;  Location: [facility] OR;  Service: Thoracic;  Laterality: Right;   VIDEO ASSISTED THORACOSCOPY (VATS)/WEDGE RESECTION Left 05/19/2018   Procedure: VIDEO ASSISTED THORACOSCOPY (VATS)/WEDGE RESECTION;  Surgeon: Zelphia Higashi, MD;  Location: [facility] OR;  Service: Thoracic;  Laterality: Left;      Social History:     Social History   Tobacco Use   Smoking status: Some Days    Current packs/day: 0.00    Average packs/day: 0.5 packs/day for 57.0 years (28.5 ttl pk-yrs)    Types: Cigarettes    Start date: 08/01/1965    Last attempt to quit: 08/01/2022    Years since quitting: 1.6    Passive exposure: Never   Smokeless tobacco: Never  Substance Use Topics   Alcohol  use: No    Alcohol /week: 0.0 standard drinks of alcohol        Family History :     Family History  Problem Relation Age of Onset   Heart attack Father    Dementia Father    Diabetes Sister    Diabetes Brother    Diabetes Sister    Colon cancer Neg Hx       Home Medications:   Prior to Admission medications   Medication Sig Start Date End Date Taking? Authorizing Provider  albuterol  (VENTOLIN  HFA) 108 (90 Base) MCG/ACT inhaler Inhale 2 puffs into the lungs 4 (four) times daily. 08/13/23  Yes [provider]  amLODipine  (NORVASC ) 5 MG tablet TAKE 1 TABLET BY MOUTH DAILY 07/18/23  Yes Branch, Joyceann No, MD  amoxicillin-clavulanate (AUGMENTIN) 500-125 MG tablet Take 1 tablet by mouth 2 (two) times daily. 03/03/24  Yes [provider]  apixaban  (ELIQUIS ) 5 MG TABS tablet TAKE 1 TABLET BY MOUTH TWICE  DAILY 01/24/24  Yes Branch, Joyceann No, MD  cholecalciferol (VITAMIN D3) 25 MCG (1000 UNIT) tablet Take 1,000 Units by mouth daily.   Yes [provider]  Cranberry 125 MG TABS Take 1 tablet by mouth daily.   Yes [provider]  ezetimibe (ZETIA) 10 MG tablet Take 10 mg by mouth at bedtime. 03/03/23  Yes [provider]  ferrous sulfate 325 (65 FE) MG tablet Take 325 mg by mouth daily with breakfast.   Yes  [provider]  losartan  (COZAAR ) 50 MG tablet Take 50 mg by mouth daily.   Yes [provider]  Metoprolol  Tartrate 37.5 MG TABS TAKE 1 TABLET BY MOUTH TWICE  DAILY 12/20/23  Yes Branch, Joyceann No, MD  Multiple Vitamin (MULTIVITAMIN WITH MINERALS) TABS tablet Take 1 tablet by mouth daily.   Yes [provider]  nitroGLYCERIN  (NITROSTAT ) 0.4 MG SL tablet Place 1 tablet (0.4 mg total) under the tongue every 5 (five) minutes x 3 doses as needed for chest pain (If no relief after 3rd dose, GO TO ED). 02/08/23 03/10/24 Yes Lasalle Pointer, NP  ondansetron  (ZOFRAN -ODT) 4 MG disintegrating tablet 4mg  ODT q4 hours prn nausea/vomit 03/04/24  Yes Mesner, Reymundo Caulk, MD  oxyCODONE -acetaminophen  (PERCOCET) 5-325 MG tablet Take 1 tablet by mouth every 6 (six) hours as needed for severe pain (pain score 7-10). 03/04/24  Yes Mesner, Reymundo Caulk, MD  pantoprazole  (PROTONIX ) 40 MG tablet Take 1 tablet (40 mg total) by mouth daily. 01/14/21  Yes Rourk, Windsor Hatcher, MD  pioglitazone  (ACTOS ) 15 MG tablet Take 15 mg by mouth daily.   Yes [provider]  polyethylene glycol powder (GLYCOLAX/MIRALAX) 17 GM/SCOOP powder Take 17 g by mouth daily.   Yes [provider]  nitrofurantoin, macrocrystal-monohydrate, (MACROBID) 100 MG capsule Take 100 mg by mouth 2 (two) times daily. Patient not taking: Reported on 03/10/2024 02/24/24   [provider]  oxyCODONE -acetaminophen  (PERCOCET/ROXICET) 5-325 MG tablet Take 1 tablet by mouth every 6 (six) hours as needed for severe pain (pain score 7-10). Patient not taking: Reported on 03/10/2024 03/04/24   Mesner, Reymundo Caulk, MD     Allergies:     Allergies  Allergen Reactions   Elemental Sulfur  Hives     Physical Exam:   Vitals  Blood pressure 109/61, pulse 65, temperature 97.9 F (36.6 C), resp. rate 16, height 5\' 6"  (1.676 m), weight 78.9 kg,  SpO2 92%.   1. General frail  Elderly female lying in bed in NAD,   2. Normal affect and insight, Not Suicidal or Homicidal, Awake Alert, Oriented X 3.  3. No F.N deficits, ALL C.Nerves Intact, Strength 5/5 all 4 extremities, Sensation intact all 4 extremities, Plantars down going.  4. Ears and Eyes appear Normal, Conjunctivae clear, PERRLA. Moist Oral Mucosa.  5. Supple Neck, No JVD, No cervical lymphadenopathy appriciated, No Carotid Bruits.  6. Symmetrical Chest wall movement, Good air movement bilaterally, CTAB.  7. RRR, No Gallops, Rubs or Murmurs, No Parasternal Heave.  8. Positive Bowel Sounds, left lower quadrant tenderness present, No organomegaly appriciated,No rebound -guarding or rigidity.  9.  No Cyanosis, Normal Skin Turgor, No Skin Rash or Bruise.  10. Good muscle tone,  joints appear normal , no effusions, Normal ROM.     Data Review:    CBC Recent Labs  Lab 03/04/24 0029 03/08/24 1315 03/10/24 1540  WBC 11.7* 6.7 11.2*  HGB 13.1 13.4 13.7  HCT 39.6 41.4 40.8  PLT 216 312 313  MCV 89.6 90.6 88.9  MCH 29.6 29.3 29.8  MCHC 33.1 32.4 33.6  RDW 14.6 14.2 14.3  LYMPHSABS 1.5 2.2  --   MONOABS 1.2* 0.4  --   EOSABS 0.1 0.1  --   BASOSABS 0.0 0.1  --    ------------------------------------------------------------------------------------------------------------------  Chemistries  Recent Labs  Lab 03/04/24 0029 03/08/24 1315 03/10/24 1540  NA 132* 136 135  K 4.0 4.4 4.5  CL 99 99 97*  CO2 24 28 27   GLUCOSE 201* 189* 182*  BUN 16 13 12   CREATININE 0.81 0.91 0.93  CALCIUM 8.7* 9.5 9.5  AST 15 31 19   ALT 13 24 19   ALKPHOS 76 77 75  BILITOT 0.7 0.5 0.6   ------------------------------------------------------------------------------------------------------------------ estimated creatinine clearance is 50.3 mL/min (by C-G formula based on SCr of 0.93  mg/dL). ------------------------------------------------------------------------------------------------------------------ No results for input(s): "TSH", "T4TOTAL", "T3FREE", "THYROIDAB" in the last 72 hours.  Invalid input(s): "FREET3"  Coagulation profile No results for input(s): "INR", "PROTIME" in the last 168 hours. ------------------------------------------------------------------------------------------------------------------- No results for input(s): "DDIMER" in the last 72 hours. -------------------------------------------------------------------------------------------------------------------  Cardiac Enzymes No results for input(s): "CKMB", "TROPONINI", "MYOGLOBIN" in the last 168 hours.  Invalid input(s): "CK" ------------------------------------------------------------------------------------------------------------------ No results found for: "BNP"   ---------------------------------------------------------------------------------------------------------------  Urinalysis    Component Value Date/Time   COLORURINE YELLOW 03/10/2024 1716   APPEARANCEUR CLEAR 03/10/2024 1716   LABSPEC 1.005 03/10/2024 1716   PHURINE 6.0 03/10/2024 1716   GLUCOSEU NEGATIVE 03/10/2024 1716   HGBUR NEGATIVE 03/10/2024 1716   BILIRUBINUR NEGATIVE 03/10/2024 1716   KETONESUR NEGATIVE 03/10/2024 1716   PROTEINUR NEGATIVE 03/10/2024 1716   NITRITE NEGATIVE 03/10/2024 1716   LEUKOCYTESUR NEGATIVE 03/10/2024 1716    ----------------------------------------------------------------------------------------------------------------   Imaging Results:    CT ABDOMEN PELVIS W CONTRAST Result Date: 03/10/2024 CLINICAL DATA:  Restaging lung cancer. History left lower lobe non-small cell lung cancer. * Tracking Code: BO * EXAM: CT CHEST, ABDOMEN, AND PELVIS WITH CONTRAST TECHNIQUE: Multidetector CT imaging of the chest, abdomen and pelvis was performed following the standard protocol during bolus  administration of intravenous contrast. RADIATION DOSE REDUCTION: This exam was performed according to the departmental dose-optimization program which includes automated exposure control, adjustment of the mA and/or kV according to patient size and/or use of iterative reconstruction technique. CONTRAST:  OMNIPAQUE  IOHEXOL  300 MG/ML SOLN; 80mL OMNIPAQUE  IOHEXOL  300 MG/ML SOLN COMPARISON:  Prior chest CT 11/29/2023 and prior abdominal CT scan 03/04/2024. FINDINGS: CT CHEST FINDINGS Cardiovascular: The heart is stable in size. Mild persistent enlargement of both the right and left atria. No pericardial effusion. Stable aortic and coronary artery calcifications. Mild enlargement of the pulmonary artery suggesting pulmonary arterial hypertension. Mediastinum/Nodes: Stable scattered mediastinal and hilar lymph nodes. No new or progressive findings. Stable large hiatal hernia. Lungs/Pleura: Stable severe underlying emphysematous changes and pulmonary scarring. No infiltrates or effusions. Stable surgical changes from bilateral lower lobe lobectomies. The lingular nodular density seen on the prior chest CT is persistent but is smaller and slightly wedge-shaped on the sagittal sequence. This is likely an area of subpleural scarring change or chronic subpleural atelectasis. No findings suspicious for neoplastic process. Musculoskeletal: No significant bony findings. CT ABDOMEN PELVIS FINDINGS Hepatobiliary: No focal hepatic lesions or intrahepatic biliary dilatation. The gallbladder is normal. No common bile duct dilatation. Pancreas: Unremarkable. No pancreatic ductal dilatation or surrounding inflammatory changes. Spleen: Normal in size without focal abnormality. Adrenals/Urinary Tract: The adrenal glands are. Stable 4.5 cm right renal cyst not requiring any further imaging evaluation or follow-up. Persistent gas noted in the bladder although not quite as significant as the prior study. This appears to be largely in  the wall of the bladder and suggest emphysematous cystitis. No bladder mass or calculi. Stomach/Bowel: The stomach is mainly up in the chest. No acute abnormality. The duodenum, small bowel and colon appears stable from the study from 4 days ago. Persistent changes of sigmoid colon diverticulitis but no abscess or obstruction. The terminal ileum and appendix are normal. Vascular/Lymphatic: Stable atherosclerotic calcifications involving the aorta and  branch vessels but no aneurysm. The major venous structures are patent. Small scattered mesenteric and retroperitoneal lymph nodes are unchanged. No mass or overt adenopathy. Reproductive: Surgically absent. Other: No pelvic mass or adenopathy. No free pelvic fluid collections. No inguinal mass or adenopathy. No abdominal wall hernia or subcutaneous lesions. Musculoskeletal: No significant bony findings. IMPRESSION: 1. Stable surgical changes from bilateral lower lobe lobectomies. No findings suspicious for recurrent tumor, mediastinal/hilar adenopathy or metastatic disease. 2. The lingular nodular density seen on the prior chest CT is persistent but is smaller and slightly wedge-shaped on the sagittal sequence. This is likely an area of subpleural scarring change or chronic subpleural atelectasis. 3. Stable severe emphysematous changes and pulmonary scarring. 4. Stable large hiatal hernia. 5. Persistent but improved changes of sigmoid colon diverticulitis but no abscess or obstruction. 6. 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 No CT findings suspicious for colovesical fistula. Aortic Atherosclerosis (ICD10-I70.0) and Emphysema (ICD10-J43.9). Electronically Signed   By: Marrian Siva M.D.   On: 03/10/2024 19:14      Assessment & Plan:    Principal Problem:   Acute diverticulitis Active Problems:   HYPERCHOLESTEROLEMIA   Paroxysmal A-fib (HCC)   Essential hypertension   Hyperlipidemia   Acute diverticulitis -Still reports nausea,  abdominal pain despite finishing her p.o. antibiotic treatment, she remains with poor oral intake - her CT abdomen showing improving diverticulitis, but is still evident on imaging and still clinically significant so she will be admitted. - Continue with IV fluids, continue with full liquid diet if remains tender then will downgrade to clear liquid. - Started on IV antibiotics, continue with IV Rocephin  and Flagyl - Continue with as needed pain regimen, as needed Percocet for moderate and IV morphine  for severe.  Constipation - Significant constipation, but reports she did take few laxatives and suppositories yesterday, where she had few episodes of diarrhea - Will keep on as needed MiraLAX.  Paroxysmal A-fib -continue with metoprolol  for heart rate control Continue with Eliquis  for anticoagulation   History of lung cancer - followed by oncology Dr. Cheree Cords, listed as stage I squamous cell - lobectomy in 2019 left side and right side 2020   Hyperlipidemia - Tolerant to statin due to muscle aches.   - Continue with Zetia    HTN - Her blood pressure is soft, so I will hold home amlodipine  and losartan , will keep on metoprolol  for heart rate control -Will add as needed hydralazine  if blood pressure starts to increase  CAD -She denies any chest pain or shortness of breath  Diabetes mellitus type II -Will check A1c - Will hold oral regimen and keep an insulin  sliding scale during hospital stay   COPD - No wheezing, no dyspnea, continue with as needed albuterol   DVT Prophylaxis : Eliquis   AM Labs Ordered, also please review Full Orders  Family Communication: Admission, patients condition and plan of care including tests being ordered have been discussed with the patient and husband at bedside* who indicate understanding and agree with the plan and Code Status.  Code Status full code  Likely DC to home  Consults called: None  Admission status: Inpatient  Time spent in  minutes : 70 minutes   Seena Dadds M.D on 03/10/2024 at 8:34 PM   [facility] - Office  [phone]

## 2024-03-11 DIAGNOSIS — E785 Hyperlipidemia, unspecified: Secondary | ICD-10-CM | POA: Diagnosis not present

## 2024-03-11 DIAGNOSIS — I480 Paroxysmal atrial fibrillation: Secondary | ICD-10-CM | POA: Diagnosis not present

## 2024-03-11 DIAGNOSIS — I10 Essential (primary) hypertension: Secondary | ICD-10-CM

## 2024-03-11 DIAGNOSIS — K5792 Diverticulitis of intestine, part unspecified, without perforation or abscess without bleeding: Secondary | ICD-10-CM | POA: Diagnosis not present

## 2024-03-11 LAB — BASIC METABOLIC PANEL WITH GFR
Anion gap: 12 (ref 5–15)
BUN: 10 mg/dL (ref 8–23)
CO2: 26 mmol/L (ref 22–32)
Calcium: 8.9 mg/dL (ref 8.9–10.3)
Chloride: 99 mmol/L (ref 98–111)
Creatinine, Ser: 0.82 mg/dL (ref 0.44–1.00)
GFR, Estimated: >60 mL/min (ref >60)
Glucose, Bld: 142 mg/dL — ABNORMAL HIGH (ref 70–99)
Potassium: 4 mmol/L (ref 3.5–5.1)
Sodium: 137 mmol/L (ref 135–145)

## 2024-03-11 LAB — CBC
HCT: 36.2 % (ref 36.0–46.0)
Hemoglobin: 12.1 g/dL (ref 12.0–15.0)
MCH: 30 pg (ref 26.0–34.0)
MCHC: 33.4 g/dL (ref 30.0–36.0)
MCV: 89.8 fL (ref 80.0–100.0)
Platelets: 291 10*3/uL (ref 150–400)
RBC: 4.03 MIL/uL (ref 3.87–5.11)
RDW: 14.4 % (ref 11.5–15.5)
WBC: 6.3 10*3/uL (ref 4.0–10.5)
nRBC: 0 % (ref 0.0–0.2)

## 2024-03-11 LAB — GLUCOSE, CAPILLARY
Glucose-Capillary: 126 mg/dL — ABNORMAL HIGH (ref 70–99)
Glucose-Capillary: 151 mg/dL — ABNORMAL HIGH (ref 70–99)
Glucose-Capillary: 170 mg/dL — ABNORMAL HIGH (ref 70–99)
Glucose-Capillary: 148 mg/dL — ABNORMAL HIGH (ref 70–99)

## 2024-03-11 LAB — METHYLMALONIC ACID, SERUM: Methylmalonic Acid, Quantitative: 124 nmol/L (ref 0–378)

## 2024-03-11 MED ORDER — PROCHLORPERAZINE EDISYLATE 10 MG/2ML IJ SOLN
10.0000 mg | Freq: Four times a day (QID) | INTRAMUSCULAR | Status: DC | PRN
  Administered 2024-03-11 – 2024-03-12 (×3): 10 mg via INTRAVENOUS
  Filled 2024-03-11 (×3): qty 2

## 2024-03-11 NOTE — Progress Notes (Signed)
 Progress Note   Patient: Monica [name]:[id number] DOB: 1940-11-26 DOA: 03/10/2024     1 DOS: the patient was seen and examined on 03/11/2024   Brief hospital admission narrative: As per H&P written by Dr. Osborne Blazer on 03/10/2024 [name]  is a 83 y.o. female, past medical history of hypertension, hyperlipidemia, COPD, paroxysmal A-fib, history of  stage Ia squamous cell carcinoma 2019,  She had a left lower lobectomy for stage Ia squamous cell carcinoma in July 2019 and then underwent a thoracoscopic right lower lobectomy for stage Ib squamous cell carcinoma in November 2020.  gastric MALT lymphoma, GERD, depression, scad scissoring, hiatal hernia.  Diabetes mellitus type 2 - Patient presents to ED secondary to complaints of abdominal pain, patient had recent ED visit 03/03/2024, CT abdomen and pelvis showing diverticulitis, was prescribed Augmentin, patient still reports persistent pain, nausea, poor appetite, her symptoms has not much improved, as well she reports constipation (she required to take multiple enemas and suppositories yesterday which did cause her to start having diarrhea today) she denies fever, chest pain, shortness of breath. - In ED workup was significant for glucose 182, white blood cell count elevated at 11.2, she was afebrile, CT abdomen pelvis was significant for improving diverticulitis but still evident on imaging, as well emphysematous cystitis, requiring multiple doses of Zofran , IV morphine , she was started on IV antibiotics and [facility] hospitalist consulted to admit.  Assessment and plan 1-acute diverticulitis - CT scan abdomen and pelvis demonstrating improving diverticulitis process - Patient reported still experiencing intermittent nausea but no vomiting. - Continue as needed antiemetics, maintain adequate hydration and advance diet as tolerated. - For now continue IV antibiotics.  2-constipation - Significant constipation, patient reports using Lasix to use  suppositories as an outpatient with already some ongoing effects - Continue as needed MiraLAX - Patient advised to maintain adequate hydration - Continue to advance diet.  3-paroxysmal atrial fibrillation - Rate controlled and currently sinus - Continue Eliquis  for secondary prevention - Continue the use of metoprolol .  4-history of lung cancer - Continue patient follow-up with oncology service - Appears to be currently in remission.  5-hypertension - Continue to follow vital signs - Amlodipine  and losartan  as blood pressure soft and patient currently with signs of mild dehydration - Follow vital signs.  6-type 2 diabetes - Continue holding oral regimen morning patient send-continue sliding scale insulin .  7-COPD - No signs of acute exacerbation currently appreciated - Good saturation on room air -Continue bronchodilator management.  Physical Exam:  Subjective: No chest pain, reported intermittent abdominal discomfort and some nausea.  No vomiting.  Tolerating full liquid diet.  Good saturation on room air and expressing no shortness of breath.  Patient is afebrile.  Vitals:   03/11/24 0607 03/11/24 0900 03/11/24 1253 03/11/24 1300  BP: (!) 106/57 99/63 (!) 98/49 (!) 97/49  Pulse: 67 71 62 63  Resp: 12 18  18   Temp: 98.2 F (36.8 C)   (!) 97.5 F (36.4 C)  TempSrc: Oral   Oral  SpO2: 93% 99% 92% 97%  Weight:      Height:       General exam: Alert, awake, oriented x 3; no fever, no shortness of breath.  Reported intermittent nausea.  Left lower quadrant discomfort. Respiratory system: No using accessory muscle.  Good saturation on room air. Cardiovascular system:RRR. No murmurs, rubs, gallops. Gastrointestinal system: Abdomen is nondistended,; no guarding.  Left lower quadrant discomfort on palpation.  Positive bowel sounds appreciated. Central nervous  system:  No focal neurological deficits. Extremities: No cyanosis or clubbing. Skin: No petechiae. Psychiatry:  Judgement and insight appear normal. Mood & affect appropriate.    Data Reviewed: Basic metabolic panel: Sodium 137, potassium 4.0, chloride 99, bicarb 26, BUN 10, creatinine 0.82 and GFR >60 A1c: Pending CBC: WBC 6.3, hemoglobin 12.1, platelet count 291K  Family Communication: No family at bedside.  Disposition: Status is: Inpatient Remains inpatient appropriate because: Continue IV antibiotics.  Anticipate discharge back home once medically stable; hoping for 03/12/2024.  Time spent: 50 minutes  Author: Justina Oman, MD 03/11/2024 3:44 PM  For on call review [email or web address].

## 2024-03-11 NOTE — Plan of Care (Signed)

## 2024-03-11 NOTE — Progress Notes (Signed)
 MEWS Progress Note  Patient Details Name: Monica [name] MRN: [id number] DOB: 13-May-1941 Today's Date: 03/11/2024   MEWS Flowsheet Documentation:  Assess: MEWS Score Temp: (!) 97.5 F (36.4 C) BP: (!) 98/50 MAP (mmHg): (!) 63 Pulse Rate: 68 Resp: 14 Level of Consciousness: Responds to Pain SpO2: 94 % O2 Device: Room Air Assess: MEWS Score MEWS Temp: 0 MEWS Systolic: 1 MEWS Pulse: 0 MEWS RR: 0 MEWS LOC: 2 MEWS Score: 3 MEWS Score Color: Yellow Assess: SIRS CRITERIA SIRS Temperature : 0 SIRS Respirations : 0 SIRS Pulse: 0 SIRS WBC: 0 SIRS Score Sum : 0 Assess: if the MEWS score is Yellow or Red Were vital signs accurate and taken at a resting state?: Yes Does the patient meet 2 or more of the SIRS criteria?: No MEWS guidelines implemented : Yes, yellow Treat MEWS Interventions: Considered administering scheduled or prn medications/treatments as ordered Take Vital Signs Increase Vital Sign Frequency : Yellow: Q2hr x1, continue Q4hrs until patient remains green for 12hrs Escalate MEWS: Escalate: Yellow: Discuss with charge nurse and consider notifying provider and/or RRT Notify: Charge Nurse/RN Name of Charge Nurse/RN Notified: Education officer, community Provider Notification Provider Name/Title: Arrien Date Provider Notified: 03/11/24 Time Provider Notified: 0423 Method of Notification: Page Notification Reason: Other (Comment) (Yellow Mews) Provider response: No new orders Date of Provider Response: 03/11/24 Time of Provider Response: 0424      [name] 03/11/2024, 4:25 AM

## 2024-03-11 NOTE — Progress Notes (Signed)
   03/11/24 0411  Vitals  Temp (!) 97.5 F (36.4 C)  Temp Source Oral  BP (!) 98/50  MAP (mmHg) (!) 63  BP Location Left Arm  BP Method Automatic  Patient Position (if appropriate) Lying  Pulse Rate 68  Pulse Rate Source Dinamap  Resp 14  MEWS COLOR  MEWS Score Color Yellow  Oxygen  Therapy  SpO2 94 %  O2 Device Room Air  MEWS Score  MEWS Temp 0  MEWS Systolic 1  MEWS Pulse 0  MEWS RR 0  MEWS LOC 2  MEWS Score 3  Provider Notification  Provider Name/Title Arrien  Date Provider Notified 03/11/24  Time Provider Notified 0423  Method of Notification Page  Notification Reason Other (Comment) (Yellow Mews)  Provider response No new orders  Date of Provider Response 03/11/24  Time of Provider Response [phone]

## 2024-03-11 NOTE — Progress Notes (Signed)
 Oral am meds not yet administered due to pt's complaint of ongoing nausea. Antiemetic administered, will attempt oral meds once nausea subsides.

## 2024-03-12 DIAGNOSIS — E785 Hyperlipidemia, unspecified: Secondary | ICD-10-CM | POA: Diagnosis not present

## 2024-03-12 DIAGNOSIS — I10 Essential (primary) hypertension: Secondary | ICD-10-CM | POA: Diagnosis not present

## 2024-03-12 DIAGNOSIS — K5792 Diverticulitis of intestine, part unspecified, without perforation or abscess without bleeding: Secondary | ICD-10-CM | POA: Diagnosis not present

## 2024-03-12 DIAGNOSIS — I480 Paroxysmal atrial fibrillation: Secondary | ICD-10-CM | POA: Diagnosis not present

## 2024-03-12 LAB — BASIC METABOLIC PANEL WITH GFR
Anion gap: 11 (ref 5–15)
BUN: 8 mg/dL (ref 8–23)
CO2: 26 mmol/L (ref 22–32)
Calcium: 8.9 mg/dL (ref 8.9–10.3)
Chloride: 102 mmol/L (ref 98–111)
Creatinine, Ser: 0.9 mg/dL (ref 0.44–1.00)
GFR, Estimated: >60 mL/min (ref >60)
Glucose, Bld: 130 mg/dL — ABNORMAL HIGH (ref 70–99)
Potassium: 4.2 mmol/L (ref 3.5–5.1)
Sodium: 139 mmol/L (ref 135–145)

## 2024-03-12 LAB — GLUCOSE, CAPILLARY
Glucose-Capillary: 136 mg/dL — ABNORMAL HIGH (ref 70–99)
Glucose-Capillary: 144 mg/dL — ABNORMAL HIGH (ref 70–99)

## 2024-03-12 LAB — CBC
HCT: 36.2 % (ref 36.0–46.0)
Hemoglobin: 11.9 g/dL — ABNORMAL LOW (ref 12.0–15.0)
MCH: 29.8 pg (ref 26.0–34.0)
MCHC: 32.9 g/dL (ref 30.0–36.0)
MCV: 90.5 fL (ref 80.0–100.0)
Platelets: 295 10*3/uL (ref 150–400)
RBC: 4 MIL/uL (ref 3.87–5.11)
RDW: 14.6 % (ref 11.5–15.5)
WBC: 5.3 10*3/uL (ref 4.0–10.5)
nRBC: 0 % (ref 0.0–0.2)

## 2024-03-12 LAB — HEMOGLOBIN A1C
Hgb A1c MFr Bld: 8.5 % — ABNORMAL HIGH (ref 4.8–5.6)
Mean Plasma Glucose: 197 mg/dL

## 2024-03-12 MED ORDER — LOSARTAN POTASSIUM 25 MG PO TABS: 25.0000 mg | ORAL_TABLET | Freq: Every day | ORAL | 1 refills | Status: DC

## 2024-03-12 MED ORDER — PANTOPRAZOLE SODIUM 40 MG PO TBEC: 40.0000 mg | DELAYED_RELEASE_TABLET | Freq: Two times a day (BID) | ORAL | 3 refills | Status: DC

## 2024-03-12 MED ORDER — OXYCODONE-ACETAMINOPHEN 5-325 MG PO TABS: 1.0000 | ORAL_TABLET | Freq: Four times a day (QID) | ORAL | 0 refills | Status: DC | PRN

## 2024-03-12 MED ORDER — AMOXICILLIN-POT CLAVULANATE 500-125 MG PO TABS: 1.0000 | ORAL_TABLET | Freq: Two times a day (BID) | ORAL | 0 refills | Status: AC

## 2024-03-12 MED ORDER — PROCHLORPERAZINE MALEATE 10 MG PO TABS: 10.0000 mg | ORAL_TABLET | Freq: Four times a day (QID) | ORAL | 0 refills | Status: DC | PRN

## 2024-03-12 NOTE — TOC CM/SW Note (Signed)
 Transition of Care [facility]) - Inpatient Brief Assessment   Patient Details  Name: Monica [name] MRN: [id number] Date of Birth: [birth year]  [facility] CM/SW Contact:    Grandville Lax, LCSWA Phone Number: 03/12/2024, 10:15 AM   Clinical Narrative: Transition of Care Department [facility]) has reviewed patient and no TOC needs have been identified at this time. We will continue to monitor patient advancement through interdiciplinary progression rounds. If new patient transition needs arise, please place a TOC consult.   Transition of Care Asessment: Insurance and Status: Insurance coverage has been reviewed Patient has primary care physician: Yes Home environment has been reviewed: From home Prior level of function:: Independent Prior/Current Home Services: No current home services Social Drivers of Health Review: SDOH reviewed no interventions necessary Readmission risk has been reviewed: Yes Transition of care needs: no transition of care needs at this time

## 2024-03-12 NOTE — Discharge Summary (Incomplete)
 Physician Discharge Summary   Patient: Monica [name] MRN: [id number] DOB: 04/05/1941  Admit date:     03/10/2024  Discharge date: 03/12/24  Discharge Physician: Justina Oman   PCP: Orest Bio, MD   Recommendations at discharge:  {Tip this will not be part of the note when signed- Example include specific recommendations for outpatient follow-up, pending tests to follow-up on. (Optional):26781}  ***  Discharge Diagnoses: Principal Problem:   Acute diverticulitis Active Problems:   HYPERCHOLESTEROLEMIA   Paroxysmal A-fib (HCC)   Essential hypertension   Hyperlipidemia  Resolved Problems:   * No resolved hospital problems. [facility] Course: No notes on file  Assessment and Plan: No notes have been filed under this hospital service. Service: Hospitalist     {Tip this will not be part of the note when signed Body mass index is 27.72 kg/m. , ,  (Optional):26781}  {(NOTE) Pain control PDMP Statment (Optional):26782} Consultants: *** Procedures performed: ***  Disposition: {Plan; Disposition:26390} Diet recommendation:  Discharge Diet Orders (From admission, onward)     Start     Ordered   03/12/24 0000  Diet - low sodium heart healthy        03/12/24 1305           {Diet_Plan:26776} DISCHARGE MEDICATION: Allergies as of 03/12/2024       Reactions   Elemental Sulfur  Hives        Medication List     STOP taking these medications    amLODipine  5 MG tablet Commonly known as: NORVASC    Cranberry 125 MG Tabs   nitrofurantoin (macrocrystal-monohydrate) 100 MG capsule Commonly known as: MACROBID   ondansetron  4 MG disintegrating tablet Commonly known as: ZOFRAN -ODT       TAKE these medications    albuterol  108 (90 Base) MCG/ACT inhaler Commonly known as: VENTOLIN  HFA Inhale 2 puffs into the lungs 4 (four) times daily.   amoxicillin-clavulanate 500-125 MG tablet Commonly known as: AUGMENTIN Take 1 tablet by mouth 2 (two) times daily  for 7 days.   cholecalciferol 25 MCG (1000 UNIT) tablet Commonly known as: VITAMIN D3 Take 1,000 Units by mouth daily.   Eliquis  5 MG Tabs tablet Generic drug: apixaban  TAKE 1 TABLET BY MOUTH TWICE  DAILY   ezetimibe 10 MG tablet Commonly known as: ZETIA Take 10 mg by mouth at bedtime.   ferrous sulfate 325 (65 FE) MG tablet Take 325 mg by mouth daily with breakfast.   losartan  25 MG tablet Commonly known as: COZAAR  Take 1 tablet (25 mg total) by mouth daily. What changed:  medication strength how much to take   Metoprolol  Tartrate 37.5 MG Tabs TAKE 1 TABLET BY MOUTH TWICE  DAILY   multivitamin with minerals Tabs tablet Take 1 tablet by mouth daily.   nitroGLYCERIN  0.4 MG SL tablet Commonly known as: NITROSTAT  Place 1 tablet (0.4 mg total) under the tongue every 5 (five) minutes x 3 doses as needed for chest pain (If no relief after 3rd dose, GO TO ED).   oxyCODONE -acetaminophen  5-325 MG tablet Commonly known as: Percocet Take 1 tablet by mouth every 6 (six) hours as needed for severe pain (pain score 7-10). What changed: Another medication with the same name was removed. Continue taking this medication, and follow the directions you see here.   pantoprazole  40 MG tablet Commonly known as: PROTONIX  Take 1 tablet (40 mg total) by mouth 2 (two) times daily. What changed: when to take this   pioglitazone  15 MG tablet Commonly  known as: ACTOS  Take 15 mg by mouth daily.   polyethylene glycol powder 17 GM/SCOOP powder Commonly known as: GLYCOLAX/MIRALAX Take 17 g by mouth daily.   prochlorperazine 10 MG tablet Commonly known as: COMPAZINE Take 1 tablet (10 mg total) by mouth every 6 (six) hours as needed for nausea or vomiting.        Follow-up Information     Sasser, Ky Phillips, MD. Schedule an appointment as soon as possible for a visit in 10 day(s).   Specialty: Family Medicine Contact information: [address] [place] [address] [phone]                 Discharge Exam: Cleavon Curls Weights   03/10/24 1400 03/10/24 2048  Weight: 78.9 kg 77.9 kg   ***  Condition at discharge: {DC Condition:26389}  The results of significant diagnostics from this hospitalization (including imaging, microbiology, ancillary and laboratory) are listed below for reference.   Imaging Studies: CT Chest W Contrast Result Date: 03/10/2024 CLINICAL DATA:  Restaging lung cancer. History left lower lobe non-small cell lung cancer. * Tracking Code: BO * EXAM: CT CHEST, ABDOMEN, AND PELVIS WITH CONTRAST TECHNIQUE: Multidetector CT imaging of the chest, abdomen and pelvis was performed following the standard protocol during bolus administration of intravenous contrast. RADIATION DOSE REDUCTION: This exam was performed according to the departmental dose-optimization program which includes automated exposure control, adjustment of the mA and/or kV according to patient size and/or use of iterative reconstruction technique. CONTRAST:  OMNIPAQUE  IOHEXOL  300 MG/ML SOLN; 80mL OMNIPAQUE  IOHEXOL  300 MG/ML SOLN COMPARISON:  Prior chest CT 11/29/2023 and prior abdominal CT scan 03/04/2024. FINDINGS: CT CHEST FINDINGS Cardiovascular: The heart is stable in size. Mild persistent enlargement of both the right and left atria. No pericardial effusion. Stable aortic and coronary artery calcifications. Mild enlargement of the pulmonary artery suggesting pulmonary arterial hypertension. Mediastinum/Nodes: Stable scattered mediastinal and hilar lymph nodes. No new or progressive findings. Stable large hiatal hernia. Lungs/Pleura: Stable severe underlying emphysematous changes and pulmonary scarring. No infiltrates or effusions. Stable surgical changes from bilateral lower lobe lobectomies. The lingular nodular density seen on the prior chest CT is persistent but is smaller and slightly wedge-shaped on the sagittal sequence. This is likely an area of subpleural scarring change or chronic subpleural  atelectasis. No findings suspicious for neoplastic process. Musculoskeletal: No significant bony findings. CT ABDOMEN PELVIS FINDINGS Hepatobiliary: No focal hepatic lesions or intrahepatic biliary dilatation. The gallbladder is normal. No common bile duct dilatation. Pancreas: Unremarkable. No pancreatic ductal dilatation or surrounding inflammatory changes. Spleen: Normal in size without focal abnormality. Adrenals/Urinary Tract: The adrenal glands are. Stable 4.5 cm right renal cyst not requiring any further imaging evaluation or follow-up. Persistent gas noted in the bladder although not quite as significant as the prior study. This appears to be largely in the wall of the bladder and suggest emphysematous cystitis. No bladder mass or calculi. Stomach/Bowel: The stomach is mainly up in the chest. No acute abnormality. The duodenum, small bowel and colon appears stable from the study from 4 days ago. Persistent changes of sigmoid colon diverticulitis but no abscess or obstruction. The terminal ileum and appendix are normal. Vascular/Lymphatic: Stable atherosclerotic calcifications involving the aorta and branch vessels but no aneurysm. The major venous structures are patent. Small scattered mesenteric and retroperitoneal lymph nodes are unchanged. No mass or overt adenopathy. Reproductive: Surgically absent. Other: No pelvic mass or adenopathy. No free pelvic fluid collections. No inguinal mass or adenopathy. No abdominal wall  hernia or subcutaneous lesions. Musculoskeletal: No significant bony findings. IMPRESSION: 1. Stable surgical changes from bilateral lower lobe lobectomies. No findings suspicious for recurrent tumor, mediastinal/hilar adenopathy or metastatic disease. 2. The lingular nodular density seen on the prior chest CT is persistent but is smaller and slightly wedge-shaped on the sagittal sequence. This is likely an area of subpleural scarring change or chronic subpleural atelectasis. 3. Stable  severe emphysematous changes and pulmonary scarring. 4. Stable large hiatal hernia. 5. Persistent but improved changes of sigmoid colon diverticulitis but no abscess or obstruction. 6. 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 No CT findings suspicious for colovesical fistula. Aortic Atherosclerosis (ICD10-I70.0) and Emphysema (ICD10-J43.9). Electronically Signed   By: Marrian Siva M.D.   On: 03/10/2024 19:14   CT ABDOMEN PELVIS W CONTRAST Result Date: 03/10/2024 CLINICAL DATA:  Restaging lung cancer. History left lower lobe non-small cell lung cancer. * Tracking Code: BO * EXAM: CT CHEST, ABDOMEN, AND PELVIS WITH CONTRAST TECHNIQUE: Multidetector CT imaging of the chest, abdomen and pelvis was performed following the standard protocol during bolus administration of intravenous contrast. RADIATION DOSE REDUCTION: This exam was performed according to the departmental dose-optimization program which includes automated exposure control, adjustment of the mA and/or kV according to patient size and/or use of iterative reconstruction technique. CONTRAST:  OMNIPAQUE  IOHEXOL  300 MG/ML SOLN; 80mL OMNIPAQUE  IOHEXOL  300 MG/ML SOLN COMPARISON:  Prior chest CT 11/29/2023 and prior abdominal CT scan 03/04/2024. FINDINGS: CT CHEST FINDINGS Cardiovascular: The heart is stable in size. Mild persistent enlargement of both the right and left atria. No pericardial effusion. Stable aortic and coronary artery calcifications. Mild enlargement of the pulmonary artery suggesting pulmonary arterial hypertension. Mediastinum/Nodes: Stable scattered mediastinal and hilar lymph nodes. No new or progressive findings. Stable large hiatal hernia. Lungs/Pleura: Stable severe underlying emphysematous changes and pulmonary scarring. No infiltrates or effusions. Stable surgical changes from bilateral lower lobe lobectomies. The lingular nodular density seen on the prior chest CT is persistent but is smaller and slightly  wedge-shaped on the sagittal sequence. This is likely an area of subpleural scarring change or chronic subpleural atelectasis. No findings suspicious for neoplastic process. Musculoskeletal: No significant bony findings. CT ABDOMEN PELVIS FINDINGS Hepatobiliary: No focal hepatic lesions or intrahepatic biliary dilatation. The gallbladder is normal. No common bile duct dilatation. Pancreas: Unremarkable. No pancreatic ductal dilatation or surrounding inflammatory changes. Spleen: Normal in size without focal abnormality. Adrenals/Urinary Tract: The adrenal glands are. Stable 4.5 cm right renal cyst not requiring any further imaging evaluation or follow-up. Persistent gas noted in the bladder although not quite as significant as the prior study. This appears to be largely in the wall of the bladder and suggest emphysematous cystitis. No bladder mass or calculi. Stomach/Bowel: The stomach is mainly up in the chest. No acute abnormality. The duodenum, small bowel and colon appears stable from the study from 4 days ago. Persistent changes of sigmoid colon diverticulitis but no abscess or obstruction. The terminal ileum and appendix are normal. Vascular/Lymphatic: Stable atherosclerotic calcifications involving the aorta and branch vessels but no aneurysm. The major venous structures are patent. Small scattered mesenteric and retroperitoneal lymph nodes are unchanged. No mass or overt adenopathy. Reproductive: Surgically absent. Other: No pelvic mass or adenopathy. No free pelvic fluid collections. No inguinal mass or adenopathy. No abdominal wall hernia or subcutaneous lesions. Musculoskeletal: No significant bony findings. IMPRESSION: 1. Stable surgical changes from bilateral lower lobe lobectomies. No findings suspicious for recurrent tumor, mediastinal/hilar adenopathy or metastatic disease. 2. The lingular  nodular density seen on the prior chest CT is persistent but is smaller and slightly wedge-shaped on the  sagittal sequence. This is likely an area of subpleural scarring change or chronic subpleural atelectasis. 3. Stable severe emphysematous changes and pulmonary scarring. 4. Stable large hiatal hernia. 5. Persistent but improved changes of sigmoid colon diverticulitis but no abscess or obstruction. 6. 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 No CT findings suspicious for colovesical fistula. Aortic Atherosclerosis (ICD10-I70.0) and Emphysema (ICD10-J43.9). Electronically Signed   By: Marrian Siva M.D.   On: 03/10/2024 19:14   CT ABDOMEN PELVIS W CONTRAST Result Date: 03/04/2024 CLINICAL DATA:  Acute nonlocalized abdominal pain, lower bilateral abdominal pain beginning Friday. History of diverticulitis. Feels similar. EXAM: CT ABDOMEN AND PELVIS WITH CONTRAST TECHNIQUE: Multidetector CT imaging of the abdomen and pelvis was performed using the standard protocol following bolus administration of intravenous contrast. RADIATION DOSE REDUCTION: This exam was performed according to the departmental dose-optimization program which includes automated exposure control, adjustment of the mA and/or kV according to patient size and/or use of iterative reconstruction technique. CONTRAST:  OMNIPAQUE  IOHEXOL  300 MG/ML  SOLN COMPARISON:  CT 01/23/2023 FINDINGS: Lower chest: No acute abnormality. Hepatobiliary: Hepatic steatosis. Normal gallbladder. No biliary dilation. Pancreas: Atrophic. Parenchymal calcifications in the body and tail compatible with sequela of chronic pancreatitis. No acute inflammation. Spleen: Unremarkable. Adrenals/Urinary Tract: Stable adrenal glands. Bilateral cortical renal scarring. Cyst in the anterior right kidney. No follow-up recommended. Bladder wall thickening with mucosal hyperenhancement. Mild perivesical stranding. There is gas within the bladder lumen and bladder wall. Stomach/Bowel: Normal caliber large and small bowel. Thickening of the sigmoid colon in the area of  extensive sigmoid diverticulosis. Adjacent inflammatory stranding and trace free fluid. No abscess. Question free air about the bladder versus is intramural gas in the bladder wall. Large hiatal hernia with intrathoracic stomach. Vascular/Lymphatic: Advanced aortic atherosclerosis. No lymphadenopathy. Reproductive: Hysterectomy.  No adnexal mass. Other: Stranding and free fluid in the pelvis greatest between the dome of the bladder and sigmoid colon. Question a few locules of free air about the bladder versus intramural air in the bladder wall. No abscess. Musculoskeletal: No acute fracture. IMPRESSION: 1. Acute sigmoid diverticulitis. Query a few locules of free gas about the bladder versus intramural gas in the bladder wall. No abscess. 2. Emphysematous cystitis. There is inflammatory stranding and fluid between the sigmoid colon dome of the bladder. No definite colovesical fistula. 3. Hepatic steatosis. 4. Large hiatal hernia with intrathoracic stomach. 5. Aortic Atherosclerosis (ICD10-I70.0). Electronically Signed   By: Rozell Cornet M.D.   On: 03/04/2024 01:34    Microbiology: Results for orders placed or performed during the hospital encounter of 03/03/24  Urine Culture     Status: None   Collection Time: 03/04/24 12:05 AM   Specimen: Urine, Clean Catch  Result Value Ref Range Status   Specimen Description   Final    URINE, CLEAN CATCH Performed at [facility], [address] [place], [place] [address]    Special Requests   Final    NONE Performed at [facility], [address], [place], [place] [address]    Culture   Final    NO GROWTH Performed at [facility], [address], [place], [place] [address]    Report Status 03/06/2024 FINAL  Final    Labs: CBC: Recent Labs  Lab 03/08/24 1315 03/10/24 1540 03/11/24 0431 03/12/24 0327  WBC 6.7 11.2* 6.3 5.3  NEUTROABS 4.0  --   --   --  HGB 13.4 13.7 12.1 11.9*  HCT 41.4 40.8 36.2 36.2  MCV 90.6 88.9 89.8 90.5  PLT  312 313 291 295   Basic Metabolic Panel: Recent Labs  Lab 03/08/24 1315 03/10/24 1540 03/11/24 0431 03/12/24 0327  NA 136 135 137 139  K 4.4 4.5 4.0 4.2  CL 99 97* 99 102  CO2 28 27 26 26   GLUCOSE 189* 182* 142* 130*  BUN 13 12 10 8   CREATININE 0.91 0.93 0.82 0.90  CALCIUM 9.5 9.5 8.9 8.9   Liver Function Tests: Recent Labs  Lab 03/08/24 1315 03/10/24 1540  AST 31 19  ALT 24 19  ALKPHOS 77 75  BILITOT 0.5 0.6  PROT 7.8 7.6  ALBUMIN  3.4* 3.5   CBG: Recent Labs  Lab 03/11/24 1111 03/11/24 1612 03/11/24 2125 03/12/24 0745 03/12/24 1122  GLUCAP 151* 170* 148* 136* 144*    Discharge time spent: {LESS THAN/GREATER THAN:26388} 30 minutes.  Signed: Justina Oman, MD [facility] 03/12/2024

## 2024-03-12 NOTE — Plan of Care (Signed)

## 2024-03-12 NOTE — Discharge Summary (Signed)
 Physician Discharge Summary   Patient: Monica [name] MRN: [id number] DOB: 01-30-1941  Admit date:     03/10/2024  Discharge date: 03/12/24  Discharge Physician: Justina Oman   PCP: Orest Bio, MD   Recommendations at discharge:  Repeat basic metabolic panel to follow electrolytes and renal function Reassess blood pressure and adjust antihypertensive treatment as needed Outpatient follow-up with gastroenterology service in about 4-6 weeks to determine the need for colonoscopy evaluation was recommended.  Discharge Diagnoses: Principal Problem:   Acute diverticulitis Active Problems:   HYPERCHOLESTEROLEMIA   Paroxysmal A-fib [facility])   Essential hypertension   Hyperlipidemia  Brief hospital admission narrative: As per H&P written by Dr. Osborne Blazer on 03/10/2024 Monica [name]  is a 83 y.o. female, past medical history of hypertension, hyperlipidemia, COPD, paroxysmal A-fib, history of  stage Ia squamous cell carcinoma 2019,  She had a left lower lobectomy for stage Ia squamous cell carcinoma in July 2019 and then underwent a thoracoscopic right lower lobectomy for stage Ib squamous cell carcinoma in November 2020.  gastric MALT lymphoma, GERD, depression, scad scissoring, hiatal hernia.  Diabetes mellitus type 2 - Patient presents to ED secondary to complaints of abdominal pain, patient had recent ED visit 03/03/2024, CT abdomen and pelvis showing diverticulitis, was prescribed Augmentin, patient still reports persistent pain, nausea, poor appetite, her symptoms has not much improved, as well she reports constipation (she required to take multiple enemas and suppositories yesterday which did cause her to start having diarrhea today) she denies fever, chest pain, shortness of breath. - In ED workup was significant for glucose 182, white blood cell count elevated at 11.2, she was afebrile, CT abdomen pelvis was significant for improving diverticulitis but still evident on imaging, as well  emphysematous cystitis, requiring multiple doses of Zofran , IV morphine , she was started on IV antibiotics and [facility] consulted to admit.  Assessment and Plan: 1-acute diverticulitis - CT scan abdomen and pelvis demonstrating improving diverticulitis process - Patient reported still my intermittent nausea and left-sided lower quadrant discomfort. - No vomiting, no fever, normal WBCs unable to tolerate diet - Discharge home with oral antibiotics, antiemetics and the use of PPI. - Patient follow-up with PCP and GI recommended.   2-constipation - Significant constipation, patient reports using laxative suppositories as an outpatient with already some ongoing effects - Continue as needed MiraLAX and Colace. - Patient advised to maintain adequate hydration - Diet advance and tolerated at discharge   3-paroxysmal atrial fibrillation - Rate controlled and currently sinus - Continue Eliquis  for secondary prevention - Continue the use of metoprolol .   4-history of lung cancer - Continue patient follow-up with oncology service - Appears to be currently in remission.   5-hypertension - Stable overall - Resume adjusted dose of home antihypertensive agents. - Heart healthy/low-sodium diet discussed with patient.   6-type 2 diabetes - Modified carbohydrate diet and adequate hydration discussed with patient - Resume home oral hypoglycemic regimen.   7-COPD - No signs of acute exacerbation currently appreciated - Good saturation on room air -Continue bronchodilator management.  Consultants: None Procedures performed: See below for x-ray reports. Disposition: Home Diet recommendation: Heart healthy and modified carbohydrate diet.  DISCHARGE MEDICATION: Allergies as of 03/12/2024       Reactions   Elemental Sulfur  Hives        Medication List     STOP taking these medications    amLODipine  5 MG tablet Commonly known as: NORVASC    Cranberry 125 MG Tabs  nitrofurantoin (macrocrystal-monohydrate) 100 MG capsule Commonly known as: MACROBID   ondansetron  4 MG disintegrating tablet Commonly known as: ZOFRAN -ODT       TAKE these medications    albuterol  108 (90 Base) MCG/ACT inhaler Commonly known as: VENTOLIN  HFA Inhale 2 puffs into the lungs 4 (four) times daily.   amoxicillin-clavulanate 500-125 MG tablet Commonly known as: AUGMENTIN Take 1 tablet by mouth 2 (two) times daily for 7 days.   cholecalciferol 25 MCG (1000 UNIT) tablet Commonly known as: VITAMIN D3 Take 1,000 Units by mouth daily.   Eliquis  5 MG Tabs tablet Generic drug: apixaban  TAKE 1 TABLET BY MOUTH TWICE  DAILY   ezetimibe 10 MG tablet Commonly known as: ZETIA Take 10 mg by mouth at bedtime.   ferrous sulfate 325 (65 FE) MG tablet Take 325 mg by mouth daily with breakfast.   losartan  25 MG tablet Commonly known as: COZAAR  Take 1 tablet (25 mg total) by mouth daily. What changed:  medication strength how much to take   Metoprolol  Tartrate 37.5 MG Tabs TAKE 1 TABLET BY MOUTH TWICE  DAILY   multivitamin with minerals Tabs tablet Take 1 tablet by mouth daily.   nitroGLYCERIN  0.4 MG SL tablet Commonly known as: NITROSTAT  Place 1 tablet (0.4 mg total) under the tongue every 5 (five) minutes x 3 doses as needed for chest pain (If no relief after 3rd dose, GO TO ED).   oxyCODONE -acetaminophen  5-325 MG tablet Commonly known as: Percocet Take 1 tablet by mouth every 6 (six) hours as needed for severe pain (pain score 7-10). What changed: Another medication with the same name was removed. Continue taking this medication, and follow the directions you see here.   pantoprazole  40 MG tablet Commonly known as: PROTONIX  Take 1 tablet (40 mg total) by mouth 2 (two) times daily. What changed: when to take this   pioglitazone  15 MG tablet Commonly known as: ACTOS  Take 15 mg by mouth daily.   polyethylene glycol powder 17 GM/SCOOP powder Commonly known  as: GLYCOLAX/MIRALAX Take 17 g by mouth daily.   prochlorperazine 10 MG tablet Commonly known as: COMPAZINE Take 1 tablet (10 mg total) by mouth every 6 (six) hours as needed for nausea or vomiting.        Follow-up Information     Sasser, Ky Phillips, MD. Schedule an appointment as soon as possible for a visit in 10 day(s).   Specialty: Family Medicine Contact information: [address] [place] [address] [phone]                Discharge Exam: Cleavon Curls Weights   03/10/24 1400 03/10/24 2048  Weight: 78.9 kg 77.9 kg   General exam: Alert, awake, oriented x 3; no fever, no shortness of breath.  Reported still experiencing mild intermittent nausea and Left lower quadrant discomfort.  Hemodynamically stable and ready to go home. Respiratory system: No using accessory muscle.  Good saturation on room air. Cardiovascular system:RRR. No murmurs, rubs, gallops. Gastrointestinal system: Abdomen is nondistended,; no guarding.  Left lower quadrant discomfort on palpation.  Positive bowel sounds appreciated. Central nervous system:  No focal neurological deficits. Extremities: No cyanosis or clubbing. Skin: No petechiae. Psychiatry: Judgement and insight appear normal. Mood & affect appropriate.   Condition at discharge: Stable and improved.  The results of significant diagnostics from this hospitalization (including imaging, microbiology, ancillary and laboratory) are listed below for reference.   Imaging Studies: CT Chest W Contrast Result Date: 03/10/2024 CLINICAL DATA:  Restaging lung  cancer. History left lower lobe non-small cell lung cancer. * Tracking Code: BO * EXAM: CT CHEST, ABDOMEN, AND PELVIS WITH CONTRAST TECHNIQUE: Multidetector CT imaging of the chest, abdomen and pelvis was performed following the standard protocol during bolus administration of intravenous contrast. RADIATION DOSE REDUCTION: This exam was performed according to the departmental dose-optimization program  which includes automated exposure control, adjustment of the mA and/or kV according to patient size and/or use of iterative reconstruction technique. CONTRAST:  OMNIPAQUE  IOHEXOL  300 MG/ML SOLN; 80mL OMNIPAQUE  IOHEXOL  300 MG/ML SOLN COMPARISON:  Prior chest CT 11/29/2023 and prior abdominal CT scan 03/04/2024. FINDINGS: CT CHEST FINDINGS Cardiovascular: The heart is stable in size. Mild persistent enlargement of both the right and left atria. No pericardial effusion. Stable aortic and coronary artery calcifications. Mild enlargement of the pulmonary artery suggesting pulmonary arterial hypertension. Mediastinum/Nodes: Stable scattered mediastinal and hilar lymph nodes. No new or progressive findings. Stable large hiatal hernia. Lungs/Pleura: Stable severe underlying emphysematous changes and pulmonary scarring. No infiltrates or effusions. Stable surgical changes from bilateral lower lobe lobectomies. The lingular nodular density seen on the prior chest CT is persistent but is smaller and slightly wedge-shaped on the sagittal sequence. This is likely an area of subpleural scarring change or chronic subpleural atelectasis. No findings suspicious for neoplastic process. Musculoskeletal: No significant bony findings. CT ABDOMEN PELVIS FINDINGS Hepatobiliary: No focal hepatic lesions or intrahepatic biliary dilatation. The gallbladder is normal. No common bile duct dilatation. Pancreas: Unremarkable. No pancreatic ductal dilatation or surrounding inflammatory changes. Spleen: Normal in size without focal abnormality. Adrenals/Urinary Tract: The adrenal glands are. Stable 4.5 cm right renal cyst not requiring any further imaging evaluation or follow-up. Persistent gas noted in the bladder although not quite as significant as the prior study. This appears to be largely in the wall of the bladder and suggest emphysematous cystitis. No bladder mass or calculi. Stomach/Bowel: The stomach is mainly up in the chest. No  acute abnormality. The duodenum, small bowel and colon appears stable from the study from 4 days ago. Persistent changes of sigmoid colon diverticulitis but no abscess or obstruction. The terminal ileum and appendix are normal. Vascular/Lymphatic: Stable atherosclerotic calcifications involving the aorta and branch vessels but no aneurysm. The major venous structures are patent. Small scattered mesenteric and retroperitoneal lymph nodes are unchanged. No mass or overt adenopathy. Reproductive: Surgically absent. Other: No pelvic mass or adenopathy. No free pelvic fluid collections. No inguinal mass or adenopathy. No abdominal wall hernia or subcutaneous lesions. Musculoskeletal: No significant bony findings. IMPRESSION: 1. Stable surgical changes from bilateral lower lobe lobectomies. No findings suspicious for recurrent tumor, mediastinal/hilar adenopathy or metastatic disease. 2. The lingular nodular density seen on the prior chest CT is persistent but is smaller and slightly wedge-shaped on the sagittal sequence. This is likely an area of subpleural scarring change or chronic subpleural atelectasis. 3. Stable severe emphysematous changes and pulmonary scarring. 4. Stable large hiatal hernia. 5. Persistent but improved changes of sigmoid colon diverticulitis but no abscess or obstruction. 6. 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 No CT findings suspicious for colovesical fistula. Aortic Atherosclerosis (ICD10-I70.0) and Emphysema (ICD10-J43.9). Electronically Signed   By: Marrian Siva M.D.   On: 03/10/2024 19:14   CT ABDOMEN PELVIS W CONTRAST Result Date: 03/10/2024 CLINICAL DATA:  Restaging lung cancer. History left lower lobe non-small cell lung cancer. * Tracking Code: BO * EXAM: CT CHEST, ABDOMEN, AND PELVIS WITH CONTRAST TECHNIQUE: Multidetector CT imaging of the chest, abdomen and pelvis was  performed following the standard protocol during bolus administration of intravenous contrast.  RADIATION DOSE REDUCTION: This exam was performed according to the departmental dose-optimization program which includes automated exposure control, adjustment of the mA and/or kV according to patient size and/or use of iterative reconstruction technique. CONTRAST:  OMNIPAQUE  IOHEXOL  300 MG/ML SOLN; 80mL OMNIPAQUE  IOHEXOL  300 MG/ML SOLN COMPARISON:  Prior chest CT 11/29/2023 and prior abdominal CT scan 03/04/2024. FINDINGS: CT CHEST FINDINGS Cardiovascular: The heart is stable in size. Mild persistent enlargement of both the right and left atria. No pericardial effusion. Stable aortic and coronary artery calcifications. Mild enlargement of the pulmonary artery suggesting pulmonary arterial hypertension. Mediastinum/Nodes: Stable scattered mediastinal and hilar lymph nodes. No new or progressive findings. Stable large hiatal hernia. Lungs/Pleura: Stable severe underlying emphysematous changes and pulmonary scarring. No infiltrates or effusions. Stable surgical changes from bilateral lower lobe lobectomies. The lingular nodular density seen on the prior chest CT is persistent but is smaller and slightly wedge-shaped on the sagittal sequence. This is likely an area of subpleural scarring change or chronic subpleural atelectasis. No findings suspicious for neoplastic process. Musculoskeletal: No significant bony findings. CT ABDOMEN PELVIS FINDINGS Hepatobiliary: No focal hepatic lesions or intrahepatic biliary dilatation. The gallbladder is normal. No common bile duct dilatation. Pancreas: Unremarkable. No pancreatic ductal dilatation or surrounding inflammatory changes. Spleen: Normal in size without focal abnormality. Adrenals/Urinary Tract: The adrenal glands are. Stable 4.5 cm right renal cyst not requiring any further imaging evaluation or follow-up. Persistent gas noted in the bladder although not quite as significant as the prior study. This appears to be largely in the wall of the bladder and suggest  emphysematous cystitis. No bladder mass or calculi. Stomach/Bowel: The stomach is mainly up in the chest. No acute abnormality. The duodenum, small bowel and colon appears stable from the study from 4 days ago. Persistent changes of sigmoid colon diverticulitis but no abscess or obstruction. The terminal ileum and appendix are normal. Vascular/Lymphatic: Stable atherosclerotic calcifications involving the aorta and branch vessels but no aneurysm. The major venous structures are patent. Small scattered mesenteric and retroperitoneal lymph nodes are unchanged. No mass or overt adenopathy. Reproductive: Surgically absent. Other: No pelvic mass or adenopathy. No free pelvic fluid collections. No inguinal mass or adenopathy. No abdominal wall hernia or subcutaneous lesions. Musculoskeletal: No significant bony findings. IMPRESSION: 1. Stable surgical changes from bilateral lower lobe lobectomies. No findings suspicious for recurrent tumor, mediastinal/hilar adenopathy or metastatic disease. 2. The lingular nodular density seen on the prior chest CT is persistent but is smaller and slightly wedge-shaped on the sagittal sequence. This is likely an area of subpleural scarring change or chronic subpleural atelectasis. 3. Stable severe emphysematous changes and pulmonary scarring. 4. Stable large hiatal hernia. 5. Persistent but improved changes of sigmoid colon diverticulitis but no abscess or obstruction. 6. 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 No CT findings suspicious for colovesical fistula. Aortic Atherosclerosis (ICD10-I70.0) and Emphysema (ICD10-J43.9). Electronically Signed   By: Marrian Siva M.D.   On: 03/10/2024 19:14   CT ABDOMEN PELVIS W CONTRAST Result Date: 03/04/2024 CLINICAL DATA:  Acute nonlocalized abdominal pain, lower bilateral abdominal pain beginning Friday. History of diverticulitis. Feels similar. EXAM: CT ABDOMEN AND PELVIS WITH CONTRAST TECHNIQUE: Multidetector CT imaging of  the abdomen and pelvis was performed using the standard protocol following bolus administration of intravenous contrast. RADIATION DOSE REDUCTION: This exam was performed according to the departmental dose-optimization program which includes automated exposure control, adjustment of the mA and/or kV  according to patient size and/or use of iterative reconstruction technique. CONTRAST:  OMNIPAQUE  IOHEXOL  300 MG/ML  SOLN COMPARISON:  CT 01/23/2023 FINDINGS: Lower chest: No acute abnormality. Hepatobiliary: Hepatic steatosis. Normal gallbladder. No biliary dilation. Pancreas: Atrophic. Parenchymal calcifications in the body and tail compatible with sequela of chronic pancreatitis. No acute inflammation. Spleen: Unremarkable. Adrenals/Urinary Tract: Stable adrenal glands. Bilateral cortical renal scarring. Cyst in the anterior right kidney. No follow-up recommended. Bladder wall thickening with mucosal hyperenhancement. Mild perivesical stranding. There is gas within the bladder lumen and bladder wall. Stomach/Bowel: Normal caliber large and small bowel. Thickening of the sigmoid colon in the area of extensive sigmoid diverticulosis. Adjacent inflammatory stranding and trace free fluid. No abscess. Question free air about the bladder versus is intramural gas in the bladder wall. Large hiatal hernia with intrathoracic stomach. Vascular/Lymphatic: Advanced aortic atherosclerosis. No lymphadenopathy. Reproductive: Hysterectomy.  No adnexal mass. Other: Stranding and free fluid in the pelvis greatest between the dome of the bladder and sigmoid colon. Question a few locules of free air about the bladder versus intramural air in the bladder wall. No abscess. Musculoskeletal: No acute fracture. IMPRESSION: 1. Acute sigmoid diverticulitis. Query a few locules of free gas about the bladder versus intramural gas in the bladder wall. No abscess. 2. Emphysematous cystitis. There is inflammatory stranding and fluid between the  sigmoid colon dome of the bladder. No definite colovesical fistula. 3. Hepatic steatosis. 4. Large hiatal hernia with intrathoracic stomach. 5. Aortic Atherosclerosis (ICD10-I70.0). Electronically Signed   By: Rozell Cornet M.D.   On: 03/04/2024 01:34    Microbiology: Results for orders placed or performed during the hospital encounter of 03/03/24  Urine Culture     Status: None   Collection Time: 03/04/24 12:05 AM   Specimen: Urine, Clean Catch  Result Value Ref Range Status   Specimen Description   Final    URINE, CLEAN CATCH Performed at [facility], [address] [place], [place] [address]    Special Requests   Final    NONE Performed at [facility], [address], [place], [place] [address]    Culture   Final    NO GROWTH Performed at [facility], [address], [place], [place] [address]    Report Status 03/06/2024 FINAL  Final    Labs: CBC: Recent Labs  Lab 03/08/24 1315 03/10/24 1540 03/11/24 0431 03/12/24 0327  WBC 6.7 11.2* 6.3 5.3  NEUTROABS 4.0  --   --   --   HGB 13.4 13.7 12.1 11.9*  HCT 41.4 40.8 36.2 36.2  MCV 90.6 88.9 89.8 90.5  PLT 312 313 291 295   Basic Metabolic Panel: Recent Labs  Lab 03/08/24 1315 03/10/24 1540 03/11/24 0431 03/12/24 0327  NA 136 135 137 139  K 4.4 4.5 4.0 4.2  CL 99 97* 99 102  CO2 28 27 26 26   GLUCOSE 189* 182* 142* 130*  BUN 13 12 10 8   CREATININE 0.91 0.93 0.82 0.90  CALCIUM 9.5 9.5 8.9 8.9   Liver Function Tests: Recent Labs  Lab 03/08/24 1315 03/10/24 1540  AST 31 19  ALT 24 19  ALKPHOS 77 75  BILITOT 0.5 0.6  PROT 7.8 7.6  ALBUMIN  3.4* 3.5   CBG: Recent Labs  Lab 03/11/24 1111 03/11/24 1612 03/11/24 2125 03/12/24 0745 03/12/24 1122  GLUCAP 151* 170* 148* 136* 144*    Discharge time spent: greater than 30 minutes.  Signed: Justina Oman, MD [facility] 03/12/2024

## 2024-03-12 NOTE — Progress Notes (Signed)
Pt discharged via WC to POV.

## 2024-03-15 ENCOUNTER — Inpatient Hospital Stay: Payer: Medicare Other | Admitting: Hematology

## 2024-03-15 VITALS — BP 118/68 | HR 74 | Temp 97.5°F | Resp 18 | Wt 175.3 lb

## 2024-03-15 DIAGNOSIS — C3431 Malignant neoplasm of lower lobe, right bronchus or lung: Secondary | ICD-10-CM

## 2024-03-15 DIAGNOSIS — C3432 Malignant neoplasm of lower lobe, left bronchus or lung: Secondary | ICD-10-CM

## 2024-03-15 DIAGNOSIS — Z8572 Personal history of non-Hodgkin lymphomas: Secondary | ICD-10-CM | POA: Diagnosis not present

## 2024-03-15 DIAGNOSIS — Z85118 Personal history of other malignant neoplasm of bronchus and lung: Secondary | ICD-10-CM | POA: Diagnosis not present

## 2024-03-15 DIAGNOSIS — F17210 Nicotine dependence, cigarettes, uncomplicated: Secondary | ICD-10-CM | POA: Diagnosis not present

## 2024-03-15 DIAGNOSIS — E559 Vitamin D deficiency, unspecified: Secondary | ICD-10-CM | POA: Diagnosis not present

## 2024-03-15 DIAGNOSIS — Z902 Acquired absence of lung [part of]: Secondary | ICD-10-CM | POA: Diagnosis not present

## 2024-03-15 NOTE — Progress Notes (Signed)
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 of subpleural scarring change or chronic subpleural atelectasis. 3. Stable severe emphysematous changes and pulmonary scarring. 4. Stable large hiatal hernia. 5. Persistent but improved changes of sigmoid colon diverticulitis but [name] abscess or obstruction. 6. 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 Aortic Atherosclerosis (ICD10-I70.0) and Emphysema (ICD10-J43.9). Electronically Signed   By: Monica [name] M.D.   On: 03/10/2024 19:14   CT ABDOMEN PELVIS W CONTRAST Result Date: 03/10/2024 CLINICAL DATA:  Restaging lung cancer. History left lower lobe non-small cell lung cancer. * Tracking Code: BO * EXAM: CT CHEST, ABDOMEN, AND PELVIS WITH CONTRAST TECHNIQUE: Multidetector CT imaging of the chest, abdomen and pelvis was performed following the standard protocol during bolus administration of intravenous contrast. RADIATION DOSE REDUCTION: This exam was performed according to the departmental dose-optimization program which includes automated exposure control, adjustment of the mA and/or kV according to  patient size and/or use of iterative reconstruction technique. CONTRAST:  OMNIPAQUE  IOHEXOL  300 MG/ML SOLN; 80mL OMNIPAQUE  IOHEXOL  300 MG/ML SOLN COMPARISON:  Prior chest CT 11/29/2023 and prior abdominal CT scan 03/04/2024. FINDINGS: CT CHEST FINDINGS Cardiovascular: The heart is stable in size. Mild persistent enlargement of both the right and left atria. [name] pericardial effusion. Stable aortic and coronary artery calcifications. Mild enlargement of the pulmonary artery suggesting pulmonary arterial hypertension. Mediastinum/Nodes: Stable scattered mediastinal and hilar lymph nodes. [name] new or progressive findings. Stable large hiatal hernia. Lungs/Pleura: Stable severe underlying emphysematous changes and pulmonary scarring. [name] infiltrates or effusions. Stable surgical changes from bilateral lower lobe lobectomies. The lingular nodular density seen on the prior chest CT is persistent but is smaller and slightly wedge-shaped on the sagittal sequence. This is likely an area of subpleural scarring change or chronic subpleural atelectasis. [name] findings suspicious for neoplastic process. Musculoskeletal: [name] significant bony findings. CT ABDOMEN PELVIS FINDINGS Hepatobiliary: [name] focal hepatic lesions or intrahepatic biliary dilatation. The gallbladder is normal. [name] common bile duct dilatation. Pancreas: Unremarkable. [name] pancreatic ductal dilatation or surrounding inflammatory changes. Spleen: Normal in size without focal abnormality. Adrenals/Urinary Tract: The adrenal glands are. Stable 4.5 cm right renal cyst not requiring any further imaging evaluation or follow-up. Persistent gas noted in the bladder although not quite as significant as the prior study. This appears to be largely in the wall of the bladder and suggest emphysematous cystitis. [name] bladder mass or calculi. Stomach/Bowel: The stomach is mainly up in the chest. [name] acute abnormality. The duodenum, small bowel and colon appears stable from the study  from 4 days ago. Persistent changes of sigmoid colon diverticulitis but [name] abscess or obstruction. The terminal ileum and appendix are normal. Vascular/Lymphatic: Stable atherosclerotic calcifications involving the aorta and branch vessels but [name] aneurysm. The major venous structures are patent. Small scattered mesenteric and retroperitoneal lymph nodes are unchanged. [name] mass or overt adenopathy. Reproductive: Surgically absent. Other: [name] pelvic mass or adenopathy. [name] free pelvic fluid collections. [name] inguinal mass or adenopathy. [name] abdominal  wall hernia or subcutaneous lesions. Musculoskeletal: [name] significant bony findings. IMPRESSION: 1. Stable surgical changes from bilateral lower lobe lobectomies. [name] findings suspicious for recurrent tumor, mediastinal/hilar adenopathy or metastatic disease. 2. The lingular nodular density seen on the prior chest CT is persistent but is smaller and slightly wedge-shaped on the sagittal sequence. This is likely an area of subpleural scarring change or chronic subpleural atelectasis. 3. Stable severe emphysematous changes and pulmonary scarring. 4. Stable large hiatal hernia. 5. Persistent but improved changes of sigmoid colon diverticulitis but [name] abscess or obstruction. 6. 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 Aortic Atherosclerosis (ICD10-I70.0) and Emphysema (ICD10-J43.9). Electronically Signed   By: Monica [name] M.D.   On: 03/10/2024 19:14   CT ABDOMEN PELVIS W CONTRAST Result Date: 03/04/2024 CLINICAL DATA:  Acute nonlocalized abdominal pain, lower bilateral abdominal pain beginning Friday. History of diverticulitis. Feels similar. EXAM: CT ABDOMEN AND PELVIS WITH CONTRAST TECHNIQUE: Multidetector CT imaging of the abdomen and pelvis was performed using the standard protocol following bolus administration of intravenous contrast. RADIATION DOSE REDUCTION: This exam was performed according to the  departmental dose-optimization program which includes automated exposure control, adjustment of the mA and/or kV according to patient size and/or use of iterative reconstruction technique. CONTRAST:  OMNIPAQUE  IOHEXOL  300 MG/ML  SOLN COMPARISON:  CT 01/23/2023 FINDINGS: Lower chest: [name] acute abnormality. Hepatobiliary: Hepatic steatosis. Normal gallbladder. [name] biliary dilation. Pancreas: Atrophic. Parenchymal calcifications in the body and tail compatible with sequela of chronic pancreatitis. [name] acute inflammation. Spleen: Unremarkable. Adrenals/Urinary Tract: Stable adrenal glands. Bilateral cortical renal scarring. Cyst in the anterior right kidney. [name] follow-up recommended. Bladder wall thickening with mucosal hyperenhancement. Mild perivesical stranding. There is gas within the bladder lumen and bladder wall. Stomach/Bowel: Normal caliber large and small bowel. Thickening of the sigmoid colon in the area of extensive sigmoid diverticulosis. Adjacent inflammatory stranding and trace free fluid. [name] abscess. Question free air about the bladder versus is intramural gas in the bladder wall. Large hiatal hernia with intrathoracic stomach. Vascular/Lymphatic: Advanced aortic atherosclerosis. [name] lymphadenopathy. Reproductive: Hysterectomy.  [name] adnexal mass. Other: Stranding and free fluid in the pelvis greatest between the dome of the bladder and sigmoid colon. Question a few locules of free air about the bladder versus intramural air in the bladder wall. [name] abscess. Musculoskeletal: [name] acute fracture. IMPRESSION: 1. Acute sigmoid diverticulitis. Query a few locules of free gas about the bladder versus intramural gas in the bladder wall. [name] abscess. 2. Emphysematous cystitis. There is inflammatory stranding and fluid between the sigmoid colon dome of the bladder. [name] definite colovesical fistula. 3. Hepatic steatosis. 4. Large hiatal hernia with intrathoracic stomach. 5. Aortic Atherosclerosis (ICD10-I70.0).  Electronically Signed   By: Rozell Cornet M.D.   On: 03/04/2024 01:34

## 2024-03-15 NOTE — Patient Instructions (Addendum)
 [facility] at [facility] Discharge Instructions   You were seen and examined today by Dr. Ellin Saba.  He reviewed the results of your lab work which are normal/stable.   He reviewed the results of your CT scan which did not show any evidence of cancer.   We will see you back in 6 months. We will repeat lab work and a CT scan prior to this visit.    Return as scheduled.    Thank you for choosing [facility] at [facility] to provide your oncology and hematology care.  To afford each patient quality time with our provider, please arrive at least 15 minutes before your scheduled appointment time.   If you have a lab appointment with the Cancer Center please come in thru the Main Entrance and check in at the main information desk.  You need to re-schedule your appointment should you arrive 10 or more minutes late.  We strive to give you quality time with our providers, and arriving late affects you and other patients whose appointments are after yours.  Also, if you no show three or more times for appointments you may be dismissed from the clinic at the providers discretion.     Again, thank you for choosing [facility].  Our hope is that these requests will decrease the amount of time that you wait before being seen by our physicians.       _____________________________________________________________  Should you have questions after your visit to [facility], please contact our office at [phone] and follow the prompts.  Our office hours are 8:00 a.m. and 4:30 p.m. Monday - Friday.  Please note that voicemails left after 4:00 p.m. may not be returned until the following business day.  We are closed weekends and major holidays.  You do have access to a nurse 24-7, just call the main number to the clinic [phone] and do not press any options, hold on the line and a nurse will answer the phone.    For prescription  refill requests, have your pharmacy contact our office and allow 72 hours.    Due to Covid, you will need to wear a mask upon entering the hospital. If you do not have a mask, a mask will be given to you at the Main Entrance upon arrival. For doctor visits, patients may have 1 support person age 47 or older with them. For treatment visits, patients can not have anyone with them due to social distancing guidelines and our immunocompromised population.

## 2024-03-20 DIAGNOSIS — E1122 Type 2 diabetes mellitus with diabetic chronic kidney disease: Secondary | ICD-10-CM | POA: Diagnosis not present

## 2024-03-27 DIAGNOSIS — M545 Low back pain, unspecified: Secondary | ICD-10-CM | POA: Diagnosis not present

## 2024-03-27 DIAGNOSIS — M6281 Muscle weakness (generalized): Secondary | ICD-10-CM | POA: Diagnosis not present

## 2024-03-27 DIAGNOSIS — R54 Age-related physical debility: Secondary | ICD-10-CM | POA: Diagnosis not present

## 2024-03-29 DIAGNOSIS — M545 Low back pain, unspecified: Secondary | ICD-10-CM | POA: Diagnosis not present

## 2024-03-29 DIAGNOSIS — M6281 Muscle weakness (generalized): Secondary | ICD-10-CM | POA: Diagnosis not present

## 2024-03-29 DIAGNOSIS — R54 Age-related physical debility: Secondary | ICD-10-CM | POA: Diagnosis not present

## 2024-04-11 DIAGNOSIS — E782 Mixed hyperlipidemia: Secondary | ICD-10-CM | POA: Diagnosis not present

## 2024-04-11 DIAGNOSIS — I700 Atherosclerosis of aorta: Secondary | ICD-10-CM | POA: Diagnosis not present

## 2024-04-11 DIAGNOSIS — K5792 Diverticulitis of intestine, part unspecified, without perforation or abscess without bleeding: Secondary | ICD-10-CM | POA: Diagnosis not present

## 2024-04-11 DIAGNOSIS — E1122 Type 2 diabetes mellitus with diabetic chronic kidney disease: Secondary | ICD-10-CM | POA: Diagnosis not present

## 2024-04-19 ENCOUNTER — Encounter: Payer: Self-pay | Admitting: Gastroenterology

## 2024-04-19 ENCOUNTER — Ambulatory Visit: Admitting: Gastroenterology

## 2024-04-19 VITALS — BP 130/86 | HR 122 | Temp 98.4°F | Ht 66.0 in | Wt 174.0 lb

## 2024-04-19 DIAGNOSIS — K5900 Constipation, unspecified: Secondary | ICD-10-CM | POA: Diagnosis not present

## 2024-04-19 DIAGNOSIS — R110 Nausea: Secondary | ICD-10-CM | POA: Diagnosis not present

## 2024-04-19 DIAGNOSIS — Z8719 Personal history of other diseases of the digestive system: Secondary | ICD-10-CM

## 2024-04-19 DIAGNOSIS — K449 Diaphragmatic hernia without obstruction or gangrene: Secondary | ICD-10-CM

## 2024-04-19 MED ORDER — ONDANSETRON 4 MG PO TBDP: 4.0000 mg | ORAL_TABLET | Freq: Three times a day (TID) | ORAL | 1 refills | Status: DC | PRN

## 2024-04-19 NOTE — Patient Instructions (Addendum)
 I believe your nausea is related to the large hiatal hernia.  I have sent in Zofran  to take up to three times a day for nausea. It would be best to take 30 minutes before eating. This can sometimes worsen constipation, so monitor for that.  For constipation: I have given samples of Linzess. This is the middle dose. We can go up or down if needed.   Linzess works best when taken once a day every day, on an empty stomach, at least 30 minutes before your first meal of the day.  When Linzess is taken daily as directed:  *Constipation relief is typically felt in about a week *IBS-C patients may begin to experience relief from belly pain and overall abdominal symptoms (pain, discomfort, and bloating) in about 1 week,   with symptoms typically improving over 12 weeks.  Diarrhea may occur in the first 2 weeks -keep taking it.  The diarrhea should go away and you should start having normal, complete, full bowel movements. It may be helpful to start treatment when you can be near the comfort of your own bathroom, such as a weekend.    Please call with progress report next week!   Continue with small, frequent meals, avoiding large portions as you are doing.   It would be helpful to increase the pantoprazole  to twice a day for the next 30 days to see how this helps.  We will have a follow-up made with Dr. Riley Cheadle in 6-8 weeks!  It was a pleasure to see you today. I want to create trusting relationships with patients and provide genuine, compassionate, and quality care. I truly value your feedback, so please be on the lookout for a survey regarding your visit with me today. I appreciate your time in completing this!         Delman Ferns, PhD, ANP-BC [facility]

## 2024-04-19 NOTE — Progress Notes (Unsigned)
 Gastroenterology Office Note    Referring Provider: Orest Bio, MD Primary Care Physician:  Orest Bio, MD  Primary GI: Dr. Riley Cheadle    Chief Complaint   Chief Complaint  Patient presents with   Nausea    Pt has had nausea X weeks.     History of Present Illness   Monica [name] is an 83 y.o. 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 Admitted. This has improved.   Took whole bottle of pepto while at beach. Tums. Compazine  not happening.   Eating small amounts. Nausea started worsening when leaving hospital May 2025 and has gradually worsened. Last 2 weeks severe. No vomiting. Always present. Wakes up nauseated. No GERD. Taking pantoprazole  once daily. Was on it BID when last seen but didn't see a difference. Has been on pantoprazole  for many years. Went back to once a day. Doesn't want to eat. Has been on multiple other PPIs in the early years.   Took Miralax  Monday and had a BM. No BM since then. Hx of constipation. Does not have improvement after BM.      EGD 12/2020 for history of dysphagia and noted to have large hiatal hernia with half of the stomach above the diaphragm, moderate Schatzki ring status post dilation.  Oncology advised 5 years of surveillance EGDs.   Past Medical History:  Diagnosis Date   Cancer (HCC)    NHL, Malt Lymphoma   Depression    Diverticulitis 04/28/2020   DM (diabetes mellitus) (HCC)    TYPE  2   Dysphagia    Dysrhythmia    History of palpatations   GERD (gastroesophageal reflux disease)    INGESTION    Heart palpitations    Hiatal hernia    Hyperlipidemia    Interstitial cystitis    Iron deficiency 04/28/2020   Lung cancer, lower lobe (HCC) 07/21/2018   Left lower lobe, stage Ia squamous cell carcinoma   MALT (mucosa associated lymphoid tissue)    gastric    Sinusitis     Past Surgical History:  Procedure Laterality Date   ABDOMINAL HYSTERECTOMY     BIOPSY  09/01/2016   Procedure: BIOPSY;  Surgeon: Suzette Espy, MD;  Location: [facility];  Service: Endoscopy;;  gastric   BIOPSY  11/29/2018   Procedure: BIOPSY;  Surgeon: Suzette Espy, MD;  Location: [facility];  Service: Endoscopy;;  right colon   BLADDER SURGERY     CATARACT EXTRACTION W/ INTRAOCULAR LENS  IMPLANT, BILATERAL  2015   CATARACT EXTRACTION, BILATERAL     COLONOSCOPY     Dr. Florette Hurry 2009: Normal per PCP notes   COLONOSCOPY N/A 11/29/2018   Rourk: Diverticulosis, 3 polyps removed.  No evidence of colitis.  Tubular adenomas.  No future surveillance colonoscopies recommended due to age.   ESOPHAGOGASTRODUODENOSCOPY     RMR: Prominant Schzgzki ring/component of peptic stricture status post dilation and disruption as described above, otherwise norma esophagus, moderate-sized hiatal hernia, antal pyloric channel, and posterier bulbar erosions, otherwise unremarkable stomach, D1 and D2 . Inflammatory findings on the stomach and duodenum will likely be related to aspirin  effect. We need to rule out Helicobacter pylor   ESOPHAGOGASTRODUODENOSCOPY N/A 03/10/2015   Dr. 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 Pathology with lymphoid  population of stomach, slight atypia   ESOPHAGOGASTRODUODENOSCOPY N/A 02/03/2016   Dr. Riley Cheadle: Schatzki ring noted at GE junction, dilated with 56 and then 58 French Maloney dilator. Large hiatal hernia. 6 x 7 cm nodular geographically ulcerated mucosa, biopsy c/w MALToma   ESOPHAGOGASTRODUODENOSCOPY N/A 04/08/2016   Dr. Riley Cheadle: moderate Schatzki's ring/web s/p dilation, large hiatal hernia, localized area of gastric lymphoma visualized and appeared to be much improved. normal second portion of the duodenum. No specimens collected. Esophageal lumen notably tighted up significantly since her  dilation in April of this year.    ESOPHAGOGASTRODUODENOSCOPY N/A 08/04/2016   Procedure: ESOPHAGOGASTRODUODENOSCOPY (EGD);  Surgeon: Suzette Espy, MD;  [facility]: [facility];  Service: Endoscopy;  Laterality: N/A;  7:30 am   ESOPHAGOGASTRODUODENOSCOPY N/A 09/01/2016   Procedure: ESOPHAGOGASTRODUODENOSCOPY (EGD);  Surgeon: Suzette Espy, MD;  [facility]: [facility];  Service: Endoscopy;  Laterality: N/A;  830    ESOPHAGOGASTRODUODENOSCOPY N/A 05/10/2017   Dr. Riley Cheadle: Moderate Schatzki ring at the GE junction, status post dilation with 54 [place].  Medium sized hiatal hernia.  Few localized erosions in the gastric antrum.  Stomach biopsy showed chronic gastritis, no H. pylori.  No atypical lymphoid infiltrates or other features of lymphoproliferative process   ESOPHAGOGASTRODUODENOSCOPY N/A 06/13/2018   Dr. Riley Cheadle: Schatzki ring status post dilation.  Large hiatal hernia.  Focal area 4 x 4 cm along the greater curvature, somewhat erythematous and thickened mucosa, biopsy benign.  Next EGD in August 2020.   ESOPHAGOGASTRODUODENOSCOPY N/A 01/14/2021   Procedure: ESOPHAGOGASTRODUODENOSCOPY (EGD);  Surgeon: Suzette Espy, MD;  [facility]: [facility];  Service: Endoscopy;  Laterality: N/A;  AM   ESOPHAGOGASTRODUODENOSCOPY (EGD) WITH PROPOFOL  N/A 06/16/2023   Procedure: ESOPHAGOGASTRODUODENOSCOPY (EGD) WITH PROPOFOL ;  Surgeon: Suzette Espy, MD;  [facility]: [facility];  Service: Endoscopy;  Laterality: N/A;  1:30 pm, asa 3   EYE SURGERY     INTERCOSTAL NERVE BLOCK Right 09/10/2019   Procedure: Intercostal Nerve Block;  Surgeon: Zelphia Higashi, MD;  Location: [facility] OR;  Service: Thoracic;  Laterality: Right;   LOBECTOMY Left 05/19/2018   Procedure: LEFT LOWER LOBECTOMY;  Surgeon: Zelphia Higashi, MD;  Location: [facility] OR;  Service: Thoracic;  Laterality: Left;   LYMPH NODE DISSECTION Right 09/10/2019   Procedure: Lymph Node Dissection;  Surgeon: Zelphia Higashi, MD;  Location: [facility] OR;   Service: Thoracic;  Laterality: Right;   MALONEY DILATION N/A 03/10/2015   Procedure: Dorothyann Gather;  Surgeon: Suzette Espy, MD;  [facility]: [facility];  Service: Endoscopy;  Laterality: N/A;   MALONEY DILATION N/A 02/03/2016   Procedure: Londa Rival DILATION;  Surgeon: Suzette Espy, MD;  [facility]: [facility];  Service: Endoscopy;  Laterality: N/A;   MALONEY DILATION N/A 04/08/2016   Procedure: Londa Rival DILATION;  Surgeon: Suzette Espy, MD;  [facility];  Service: Endoscopy;  Laterality: N/A;   MALONEY DILATION N/A 05/10/2017   Procedure: Londa Rival DILATION;  Surgeon: Suzette Espy, MD;  [facility]: [facility];  Service: Endoscopy;  Laterality: N/A;   MALONEY DILATION N/A 06/13/2018   Procedure: Londa Rival DILATION;  Surgeon: Suzette Espy, MD;  [facility];  Service: Endoscopy;  Laterality: N/A;   MALONEY DILATION N/A 01/14/2021   Procedure: Londa Rival DILATION;  Surgeon: Suzette Espy, MD;  [facility];  Service: Endoscopy;  Laterality: N/A;   MALONEY DILATION N/A 06/16/2023   Procedure: Londa Rival DILATION;  Surgeon: Suzette Espy, MD;  [facility];  Service: Endoscopy;  Laterality: N/A;   POLYPECTOMY  11/29/2018   Procedure: POLYPECTOMY;  Surgeon: Suzette Espy, MD;  [facility]: [facility];  Service: Endoscopy;;   VIDEO ASSISTED THORACOSCOPY (VATS)/ LOBECTOMY Right 09/10/2019   Procedure: VIDEO ASSISTED THORACOSCOPY (VATS)/RIGHT LOWER LOBECTOMY;  Surgeon: Zelphia Higashi, MD;  [facility] OR;  Service: Thoracic;  Laterality: Right;   VIDEO ASSISTED THORACOSCOPY (VATS)/WEDGE RESECTION Left 05/19/2018   Procedure: VIDEO ASSISTED THORACOSCOPY (VATS)/WEDGE RESECTION;  Surgeon: Zelphia Higashi, MD;  Location: [facility] OR;  Service: Thoracic;  Laterality: Left;    Current Outpatient Medications  Medication Sig Dispense Refill   albuterol  (VENTOLIN  HFA) 108 (90 Base) MCG/ACT inhaler Inhale 2 puffs into the lungs 4 (four) times daily.      apixaban  (ELIQUIS ) 5 MG TABS tablet TAKE 1 TABLET BY MOUTH TWICE  DAILY 200 tablet 1   cholecalciferol (VITAMIN D3) 25 MCG (1000 UNIT) tablet Take 1,000 Units by mouth daily.     ezetimibe  (ZETIA ) 10 MG tablet Take 10 mg by mouth at bedtime.     ferrous sulfate 325 (65 FE) MG tablet Take 325 mg by mouth daily with breakfast.     losartan  (COZAAR ) 25 MG tablet Take 1 tablet (25 mg total) by mouth daily. 30 tablet 1   Metoprolol  Tartrate 37.5 MG TABS TAKE 1 TABLET BY MOUTH TWICE  DAILY 200 tablet 1   Multiple Vitamin (MULTIVITAMIN WITH MINERALS) TABS tablet Take 1 tablet by mouth daily.     nitroGLYCERIN  (NITROSTAT ) 0.4 MG SL tablet Place 1 tablet (0.4 mg total) under the tongue every 5 (five) minutes x 3 doses as needed for chest pain (If no relief after 3rd dose, GO TO ED). 30 tablet 1   pantoprazole  (PROTONIX ) 40 MG tablet Take 1 tablet (40 mg total) by mouth 2 (two) times daily. (Patient taking differently: Take 40 mg by mouth daily.) 90 tablet 3   pioglitazone  (ACTOS ) 15 MG tablet Take 15 mg by mouth daily.     polyethylene glycol powder (GLYCOLAX /MIRALAX ) 17 GM/SCOOP powder Take 17 g by mouth daily.     oxyCODONE -acetaminophen  (PERCOCET) 5-325 MG tablet Take 1 tablet by mouth every 6 (six) hours as needed for severe pain (pain score 7-10). (Patient not taking: Reported on 04/19/2024) 20 tablet 0   prochlorperazine  (COMPAZINE ) 10 MG tablet Take 1 tablet (10 mg total) by mouth every 6 (six) hours as needed for nausea or vomiting. (Patient not taking: Reported on 04/19/2024) 30 tablet 0   No current facility-administered medications for this visit.    Allergies as of 04/19/2024 - Review Complete 04/19/2024  Allergen Reaction Noted   Elemental sulfur  Hives 06/04/2016    Family History  Problem Relation Age of Onset   Heart attack Father    Dementia Father    Diabetes Sister    Diabetes Brother    Diabetes Sister    Colon cancer Neg Hx     Social History   Socioeconomic History    Marital status: Married    Spouse name: Not on file   Number of children: Not on file   Years of education: Not on file   Highest education level: Not on file  Occupational History   Not on file  Tobacco Use   Smoking status: Some Days    Current packs/day: 0.00    Average packs/day: 0.5 packs/day for 57.0 years (28.5 ttl pk-yrs)    Types: Cigarettes    Start date: 08/01/1965  Last attempt to quit: 08/01/2022    Years since quitting: 1.7    Passive exposure: Never   Smokeless tobacco: Never  Vaping Use   Vaping status: Never Used  Substance and Sexual Activity   Alcohol  use: No    Alcohol /week: 0.0 standard drinks of alcohol    Drug use: No   Sexual activity: Not Currently  Other Topics Concern   Not on file  Social History Narrative   Not on file   Social Drivers of Health   Financial Resource Strain: Not on file  Food Insecurity: No Food Insecurity (03/10/2024)   Hunger Vital Sign    Worried About Running Out of Food in the Last Year: Never true    Ran Out of Food in the Last Year: Never true  Transportation Needs: No Transportation Needs (03/10/2024)   PRAPARE - Administrator, Civil Service (Medical): No    Lack of Transportation (Non-Medical): No  Physical Activity: Not on file  Stress: Not on file  Social Connections: Socially Integrated (03/10/2024)   Social Connection and Isolation Panel    Frequency of Communication with Friends and Family: Three times a week    Frequency of Social Gatherings with Friends and Family: Once a week    Attends Religious Services: More than 4 times per year    Active Member of [employer] or Organizations: Yes    Attends Engineer, structural: More than 4 times per year    Marital Status: Married  Catering manager Violence: Not At Risk (03/10/2024)   Humiliation, Afraid, Rape, and Kick questionnaire    Fear of Current or Ex-Partner: No    Emotionally Abused: No    Physically Abused: No    Sexually Abused: No      Review of Systems   Gen: Denies any fever, chills, fatigue, weight loss, lack of appetite.  CV: Denies chest pain, heart palpitations, peripheral edema, syncope.  Resp: Denies shortness of breath at rest or with exertion. Denies wheezing or cough.  GI: Denies dysphagia or odynophagia. Denies jaundice, hematemesis, fecal incontinence. GU : Denies urinary burning, urinary frequency, urinary hesitancy MS: Denies joint pain, muscle weakness, cramps, or limitation of movement.  Derm: Denies rash, itching, dry skin Psych: Denies depression, anxiety, memory loss, and confusion Heme: Denies bruising, bleeding, and enlarged lymph nodes.   Physical Exam   BP 130/86   Pulse (!) 122   Temp 98.4 F (36.9 C)   Ht 5' 6 (1.676 m)   Wt 174 lb (78.9 kg)   BMI 28.08 kg/m  General:   Alert and oriented. Pleasant and cooperative. Well-nourished and well-developed.  Head:  Normocephalic and atraumatic. Eyes:  Without icterus Ears:  Normal auditory acuity. Lungs:  Clear to auscultation bilaterally.  Heart:  S1, S2 present without murmurs appreciated.  Abdomen:  +BS, soft, non-tender and non-distended. No HSM noted. No guarding or rebound. No masses appreciated.  Rectal:  Deferred  Msk:  Symmetrical without gross deformities. Normal posture. Extremities:  Without edema. Neurologic:  Alert and  oriented x4;  grossly normal neurologically. Skin:  Intact without significant lesions or rashes. Psych:  Alert and cooperative. Normal mood and affect.   Assessment   Monica [name] is a 83 y.o. female presenting today with    Increased nausea secondary to known large hiatal hernia. Recommend increasing PPI to BID, adding Zofran  TID, continue with dietary modifications.  For constipation, will start Linzess 145 mcg daily.  She prefers to see Dr. Riley Cheadle at  next appt.   Discuss risks and benefits of colonoscopy at next appt due to recent diverticulitis episode. May want to follow conservatively  in light of age.    PLAN     Delman Ferns, PhD, ANP-BC [facility]

## 2024-04-20 ENCOUNTER — Ambulatory Visit: Payer: Medicare Other | Admitting: Cardiology

## 2024-04-20 ENCOUNTER — Encounter: Payer: Self-pay | Admitting: *Deleted

## 2024-04-20 ENCOUNTER — Encounter: Payer: Self-pay | Admitting: Cardiology

## 2024-04-20 ENCOUNTER — Ambulatory Visit: Attending: Cardiology | Admitting: Cardiology

## 2024-04-20 VITALS — BP 125/70 | HR 79 | Ht 66.0 in | Wt 173.6 lb

## 2024-04-20 DIAGNOSIS — I10 Essential (primary) hypertension: Secondary | ICD-10-CM | POA: Diagnosis not present

## 2024-04-20 DIAGNOSIS — D6869 Other thrombophilia: Secondary | ICD-10-CM

## 2024-04-20 DIAGNOSIS — I272 Pulmonary hypertension, unspecified: Secondary | ICD-10-CM

## 2024-04-20 DIAGNOSIS — I480 Paroxysmal atrial fibrillation: Secondary | ICD-10-CM

## 2024-04-20 DIAGNOSIS — E782 Mixed hyperlipidemia: Secondary | ICD-10-CM

## 2024-04-20 NOTE — Progress Notes (Signed)
 Clinical Summary Ms. Livingstone is a 83 y.o.female seen today for follow up of the following medical problems.    1. PAF - has not tolerated amio in the past - no palpitations - compliant with meds. No bleeding on eliquis .      2. History of lung cancer - followed by oncology, listed as stage I squamous cell - lobectomy in 2019 left side and right side 2020     3. Hyperlipidemia - muscle aches, pcp stopped statin. Most recently was on pravastatin . Symptoms resolved off statin - she is on zetia  10mg  daily now - 07/2022 TC 146 HDL 62 TG 117 LDL 60 - reports labs in Jan coming up with pcp   4. HTN - pcp stopped norvasc  due to low bp's - home bp's typically 110s/60s     5. Enlarged pulmonary artery/pulmonic trunk - noted on 06/2021 CT scan, also with signs of emphysema - 2019 echo could not estiamted PASP, normal RV. She did have grade II dd, LVEF 65-70%   08/2019 PFTs: moderate obstruction, moderate diffusion defect 08/2021 echo LVEF 70-75%, grade II dd, normal RV, PASP 38, mild MR   - denies SOB, no recent edema.    7. Coronary atherosclerosis - noted on CT scan for lung cancer screening - no recent chest pains.   8. Diverticulitis - admission 03/2024 Past Medical History:  Diagnosis Date   Cancer (HCC)    NHL, Malt Lymphoma   Depression    Diverticulitis 04/28/2020   DM (diabetes mellitus) (HCC)    TYPE  2   Dysphagia    Dysrhythmia    History of palpatations   GERD (gastroesophageal reflux disease)    INGESTION    Heart palpitations    Hiatal hernia    Hyperlipidemia    Interstitial cystitis    Iron deficiency 04/28/2020   Lung cancer, lower lobe (HCC) 07/21/2018   Left lower lobe, stage Ia squamous cell carcinoma   MALT (mucosa associated lymphoid tissue)    gastric   Sinusitis      Allergies  Allergen Reactions   Elemental Sulfur  Hives     Current Outpatient Medications  Medication Sig Dispense Refill   albuterol  (VENTOLIN  HFA) 108 (90 Base)  MCG/ACT inhaler Inhale 2 puffs into the lungs 4 (four) times daily.     apixaban  (ELIQUIS ) 5 MG TABS tablet TAKE 1 TABLET BY MOUTH TWICE  DAILY 200 tablet 1   cholecalciferol (VITAMIN D3) 25 MCG (1000 UNIT) tablet Take 1,000 Units by mouth daily.     ezetimibe  (ZETIA ) 10 MG tablet Take 10 mg by mouth at bedtime.     ferrous sulfate 325 (65 FE) MG tablet Take 325 mg by mouth daily with breakfast.     losartan  (COZAAR ) 25 MG tablet Take 1 tablet (25 mg total) by mouth daily. 30 tablet 1   Metoprolol  Tartrate 37.5 MG TABS TAKE 1 TABLET BY MOUTH TWICE  DAILY 200 tablet 1   Multiple Vitamin (MULTIVITAMIN WITH MINERALS) TABS tablet Take 1 tablet by mouth daily.     nitroGLYCERIN  (NITROSTAT ) 0.4 MG SL tablet Place 1 tablet (0.4 mg total) under the tongue every 5 (five) minutes x 3 doses as needed for chest pain (If no relief after 3rd dose, GO TO ED). 30 tablet 1   ondansetron  (ZOFRAN -ODT) 4 MG disintegrating tablet Take 1 tablet (4 mg total) by mouth 3 (three) times daily as needed for nausea or vomiting. 120 tablet 1   pantoprazole  (PROTONIX )  40 MG tablet Take 1 tablet (40 mg total) by mouth 2 (two) times daily. (Patient taking differently: Take 40 mg by mouth daily.) 90 tablet 3   pioglitazone  (ACTOS ) 15 MG tablet Take 15 mg by mouth daily.     polyethylene glycol powder (GLYCOLAX /MIRALAX ) 17 GM/SCOOP powder Take 17 g by mouth daily.     prochlorperazine  (COMPAZINE ) 10 MG tablet Take 1 tablet (10 mg total) by mouth every 6 (six) hours as needed for nausea or vomiting. (Patient not taking: Reported on 04/20/2024) 30 tablet 0   No current facility-administered medications for this visit.     Past Surgical History:  Procedure Laterality Date   ABDOMINAL HYSTERECTOMY     BIOPSY  09/01/2016   Procedure: BIOPSY;  Surgeon: Suzette Espy, MD;  Location: [facility];  Service: Endoscopy;;  gastric   BIOPSY  11/29/2018   Procedure: BIOPSY;  Surgeon: Suzette Espy, MD;  Location: [facility];  Service:  Endoscopy;;  right colon   BLADDER SURGERY     CATARACT EXTRACTION W/ INTRAOCULAR LENS  IMPLANT, BILATERAL  2015   CATARACT EXTRACTION, BILATERAL     COLONOSCOPY     Dr. Florette Hurry 2009: Normal per PCP notes   COLONOSCOPY N/A 11/29/2018   Rourk: Diverticulosis, 3 polyps removed.  No evidence of colitis.  Tubular adenomas.  No future surveillance colonoscopies recommended due to age.   ESOPHAGOGASTRODUODENOSCOPY     RMR: Prominant Schzgzki ring/component of peptic stricture status post dilation and disruption as described above, otherwise norma esophagus, moderate-sized hiatal hernia, antal pyloric channel, and posterier bulbar erosions, otherwise unremarkable stomach, D1 and D2 . Inflammatory findings on the stomach and duodenum will likely be related to aspirin  effect. We need to rule out Helicobacter pylor   ESOPHAGOGASTRODUODENOSCOPY N/A 03/10/2015   Dr. 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 Pathology with lymphoid population of stomach, slight atypia   ESOPHAGOGASTRODUODENOSCOPY N/A 02/03/2016   Dr. Riley Cheadle: Schatzki ring noted at GE junction, dilated with 56 and then 58 French Maloney dilator. Large hiatal hernia. 6 x 7 cm nodular geographically ulcerated mucosa, biopsy c/w MALToma   ESOPHAGOGASTRODUODENOSCOPY N/A 04/08/2016   Dr. Riley Cheadle: moderate Schatzki's ring/web s/p dilation, large hiatal hernia, localized area of gastric lymphoma visualized and appeared to be much improved. normal second portion of the duodenum. No specimens collected. Esophageal lumen notably tighted up significantly since her dilation in April of this year.    ESOPHAGOGASTRODUODENOSCOPY N/A 08/04/2016   Procedure: ESOPHAGOGASTRODUODENOSCOPY (EGD);  Surgeon: Suzette Espy, MD;  [facility]: [facility];  Service: Endoscopy;  Laterality: N/A;  7:30 am   ESOPHAGOGASTRODUODENOSCOPY N/A 09/01/2016   Procedure: ESOPHAGOGASTRODUODENOSCOPY (EGD);  Surgeon: Suzette Espy, MD;  [facility]: [facility];  Service: Endoscopy;  Laterality: N/A;  830    ESOPHAGOGASTRODUODENOSCOPY N/A 05/10/2017   Dr. Riley Cheadle: Moderate Schatzki ring at the GE junction, status post dilation with 54 [place].  Medium sized hiatal hernia.  Few localized erosions in the gastric antrum.  Stomach biopsy showed chronic gastritis, no H. pylori.  No atypical lymphoid infiltrates or other features of lymphoproliferative process   ESOPHAGOGASTRODUODENOSCOPY N/A 06/13/2018   Dr. Riley Cheadle: Schatzki ring status post dilation.  Large hiatal hernia.  Focal area 4 x 4 cm along the greater curvature, somewhat erythematous and thickened mucosa, biopsy benign.  Next EGD in August 2020.   ESOPHAGOGASTRODUODENOSCOPY N/A 01/14/2021   Procedure: ESOPHAGOGASTRODUODENOSCOPY (EGD);  Surgeon: Suzette Espy, MD;  [facility];  Service: Endoscopy;  Laterality: N/A;  AM   ESOPHAGOGASTRODUODENOSCOPY (EGD) WITH PROPOFOL  N/A 06/16/2023   Procedure: ESOPHAGOGASTRODUODENOSCOPY (EGD) WITH PROPOFOL ;  Surgeon: Suzette Espy, MD;  [facility];  Service: Endoscopy;  Laterality: N/A;  1:30 pm, asa 3   EYE SURGERY     INTERCOSTAL NERVE BLOCK Right 09/10/2019   Procedure: Intercostal Nerve Block;  Surgeon: Zelphia Higashi, MD;  Location[facility];  Service: Thoracic;  Laterality: Right;   LOBECTOMY Left 05/19/2018   Procedure: LEFT LOWER LOBECTOMY;  Surgeon: Zelphia Higashi, MD;  Location[facility] OR;  Service: Thoracic;  Laterality: Left;   LYMPH NODE DISSECTION Right 09/10/2019   Procedure: Lymph Node Dissection;  Surgeon: Zelphia Higashi, MD;  Location: [facility] OR;  Service: Thoracic;  Laterality: Right;   MALONEY DILATION N/A 03/10/2015   Procedure: Dorothyann Gather;  Surgeon: Suzette Espy, MD;  [facility];  Service: Endoscopy;  Laterality: N/A;   MALONEY DILATION N/A 02/03/2016   Procedure: Londa Rival DILATION;  Surgeon: Suzette Espy, MD;  [facility]: [facility];  Service: Endoscopy;   Laterality: N/A;   MALONEY DILATION N/A 04/08/2016   Procedure: Londa Rival DILATION;  Surgeon: Suzette Espy, MD;  [facility];  Service: Endoscopy;  Laterality: N/A;   MALONEY DILATION N/A 05/10/2017   Procedure: Londa Rival DILATION;  Surgeon: Suzette Espy, MD;  [facility];  Service: Endoscopy;  Laterality: N/A;   MALONEY DILATION N/A 06/13/2018   Procedure: Londa Rival DILATION;  Surgeon: Suzette Espy, MD;  [facility];  Service: Endoscopy;  Laterality: N/A;   MALONEY DILATION N/A 01/14/2021   Procedure: Londa Rival DILATION;  Surgeon: Suzette Espy, MD;  [facility];  Service: Endoscopy;  Laterality: N/A;   MALONEY DILATION N/A 06/16/2023   Procedure: Londa Rival DILATION;  Surgeon: Suzette Espy, MD;  [facility];  Service: Endoscopy;  Laterality: N/A;   POLYPECTOMY  11/29/2018   Procedure: POLYPECTOMY;  Surgeon: Suzette Espy, MD;  [facility];  Service: Endoscopy;;   VIDEO ASSISTED THORACOSCOPY (VATS)/ LOBECTOMY Right 09/10/2019   Procedure: VIDEO ASSISTED THORACOSCOPY (VATS)/RIGHT LOWER LOBECTOMY;  Surgeon: Zelphia Higashi, MD;  Location: [facility] OR;  Service: Thoracic;  Laterality: Right;   VIDEO ASSISTED THORACOSCOPY (VATS)/WEDGE RESECTION Left 05/19/2018   Procedure: VIDEO ASSISTED THORACOSCOPY (VATS)/WEDGE RESECTION;  Surgeon: Zelphia Higashi, MD;  Location: [facility] OR;  Service: Thoracic;  Laterality: Left;     Allergies  Allergen Reactions   Elemental Sulfur  Hives      Family History  Problem Relation Age of Onset   Heart attack Father    Dementia Father    Diabetes Sister    Diabetes Brother    Diabetes Sister    Colon cancer Neg Hx      Social History Ms. Busker reports that she has been smoking cigarettes. She started smoking about 58 years ago. She has a 28.5 pack-year smoking history. She has never been exposed to tobacco smoke. She has never used smokeless tobacco. Ms. Chiem reports no history of  alcohol  use.     Physical Examination Today's Vitals   04/20/24 1147 04/20/24 1157 04/20/24 1228  BP: (!) 158/92 (!) 142/84 125/70  Pulse: 79    SpO2: 96%    Weight: 173 lb 9.6 oz (78.7 kg)    Height: 5' 6 (1.676 m)     Body mass index is 28.02 kg/m.  Gen: resting comfortably, no  acute distress HEENT: no scleral icterus, pupils equal round and reactive, no palptable cervical adenopathy,  CV: irreg, no mrg, no jvd Resp: Clear to auscultation bilaterally GI: abdomen is soft, non-tender, non-distended, normal bowel sounds, no hepatosplenomegaly MSK: extremities are warm, no edema.  Skin: warm, no rash Neuro:  no focal deficits Psych: appropriate affect   Diagnostic Studies  ECHO 2019: - Left ventricle: The cavity size was normal. Wall thickness was   increased in a pattern of moderate LVH. Systolic function was   vigorous. The estimated ejection fraction was in the range of 65%   to 70%. Wall motion was normal; there were no regional wall   motion abnormalities. Features are consistent with a pseudonormal   left ventricular filling pattern, with concomitant abnormal   relaxation and increased filling pressure (grade 2 diastolic   dysfunction). - Aortic valve: Mildly calcified annulus. Trileaflet. - Mitral valve: There was mild regurgitation. - Left atrium: The atrium was moderately dilated. - Right atrium: Central venous pressure (est): 3 mm Hg. - Atrial septum: No defect or patent foramen ovale was identified. - Tricuspid valve: There was trivial regurgitation. - Pulmonary arteries: Systolic pressure could not be accurately   estimated. - Pericardium, extracardiac: There was no pericardial effusion   08/2021 echo 1. Left ventricular ejection fraction, by estimation, is 70 to 75%. The  left ventricle has hyperdynamic function. The left ventricle has no  regional wall motion abnormalities. There is moderate concentric left  ventricular hypertrophy. Left ventricular   diastolic parameters are consistent with Grade II diastolic dysfunction  (pseudonormalization).   2. Right ventricular systolic function is normal. The right ventricular  size is normal. There is mildly elevated pulmonary artery systolic  pressure. The estimated right ventricular systolic pressure is 38.8 mmHg.   3. Left atrial size was mildly dilated.   4. The mitral valve is abnormal. Mild mitral valve regurgitation.   5. The aortic valve is tricuspid. Aortic valve regurgitation is not  visualized. Mild aortic valve sclerosis is present, with no evidence of  aortic valve stenosis.   6. The inferior vena cava is normal in size with greater than 50%  respiratory variability, suggesting right atrial pressure of 3 mmHg.   Comparison(s): Prior images unable to be directly viewed.    Assessment and Plan   1. PAF/acquired thrombophilia - EKG today shows rate controlled afib - no symptoms - continue current meds including eliquis  for stroke prevention   2. HTN -bp at goal on manual recheck, continue current meds   3. Hyperlipidemia - request labs from pcp   4. Pulmonary HTN - mild by echo with normal RV, some enlargement of PA on prior CT scans - likely secondary to COPD by PFTs, grade II diastolic dysfunction - denies any symptoms        Laurann Pollock, M.D.

## 2024-04-20 NOTE — Patient Instructions (Addendum)
 Medication Instructions:  Continue all current medications.   Labwork: none  Testing/Procedures: none  Follow-Up: 6 months   Any Other Special Instructions Will Be Listed Below (If Applicable).   If you need a refill on your cardiac medications before your next appointment, please call your pharmacy.

## 2024-04-23 ENCOUNTER — Encounter: Payer: Self-pay | Admitting: Family Medicine

## 2024-04-24 DIAGNOSIS — M6281 Muscle weakness (generalized): Secondary | ICD-10-CM | POA: Diagnosis not present

## 2024-04-24 DIAGNOSIS — M545 Low back pain, unspecified: Secondary | ICD-10-CM | POA: Diagnosis not present

## 2024-04-24 DIAGNOSIS — R54 Age-related physical debility: Secondary | ICD-10-CM | POA: Diagnosis not present

## 2024-04-26 DIAGNOSIS — L821 Other seborrheic keratosis: Secondary | ICD-10-CM | POA: Diagnosis not present

## 2024-04-26 DIAGNOSIS — M545 Low back pain, unspecified: Secondary | ICD-10-CM | POA: Diagnosis not present

## 2024-04-26 DIAGNOSIS — L570 Actinic keratosis: Secondary | ICD-10-CM | POA: Diagnosis not present

## 2024-04-26 DIAGNOSIS — M6281 Muscle weakness (generalized): Secondary | ICD-10-CM | POA: Diagnosis not present

## 2024-04-26 DIAGNOSIS — R54 Age-related physical debility: Secondary | ICD-10-CM | POA: Diagnosis not present

## 2024-04-30 DIAGNOSIS — M545 Low back pain, unspecified: Secondary | ICD-10-CM | POA: Diagnosis not present

## 2024-04-30 DIAGNOSIS — M6281 Muscle weakness (generalized): Secondary | ICD-10-CM | POA: Diagnosis not present

## 2024-04-30 DIAGNOSIS — R54 Age-related physical debility: Secondary | ICD-10-CM | POA: Diagnosis not present

## 2024-05-02 DIAGNOSIS — R54 Age-related physical debility: Secondary | ICD-10-CM | POA: Diagnosis not present

## 2024-05-02 DIAGNOSIS — M545 Low back pain, unspecified: Secondary | ICD-10-CM | POA: Diagnosis not present

## 2024-05-02 DIAGNOSIS — M6281 Muscle weakness (generalized): Secondary | ICD-10-CM | POA: Diagnosis not present

## 2024-05-03 ENCOUNTER — Other Ambulatory Visit: Payer: Self-pay | Admitting: Cardiology

## 2024-05-18 ENCOUNTER — Ambulatory Visit: Admitting: Internal Medicine

## 2024-05-18 ENCOUNTER — Encounter: Payer: Self-pay | Admitting: Internal Medicine

## 2024-05-18 ENCOUNTER — Other Ambulatory Visit: Payer: Self-pay

## 2024-05-18 VITALS — BP 138/87 | HR 78 | Temp 98.6°F | Ht 66.0 in | Wt 170.6 lb

## 2024-05-18 DIAGNOSIS — K5732 Diverticulitis of large intestine without perforation or abscess without bleeding: Secondary | ICD-10-CM

## 2024-05-18 DIAGNOSIS — C884 Extranodal marginal zone b-cell lymphoma of mucosa-associated lymphoid tissue (malt-lymphoma) not having achieved remission: Secondary | ICD-10-CM | POA: Diagnosis not present

## 2024-05-18 DIAGNOSIS — K219 Gastro-esophageal reflux disease without esophagitis: Secondary | ICD-10-CM | POA: Diagnosis not present

## 2024-05-18 DIAGNOSIS — K449 Diaphragmatic hernia without obstruction or gangrene: Secondary | ICD-10-CM | POA: Diagnosis not present

## 2024-05-18 DIAGNOSIS — R1310 Dysphagia, unspecified: Secondary | ICD-10-CM

## 2024-05-18 MED ORDER — PANTOPRAZOLE SODIUM 40 MG PO TBEC: 40.0000 mg | DELAYED_RELEASE_TABLET | Freq: Two times a day (BID) | ORAL | 3 refills | Status: DC

## 2024-05-18 MED ORDER — CIPROFLOXACIN HCL 500 MG PO TABS: 500.0000 mg | ORAL_TABLET | Freq: Two times a day (BID) | ORAL | 0 refills | Status: AC

## 2024-05-18 MED ORDER — CIPROFLOXACIN HCL 500 MG PO TABS: 500.0000 mg | ORAL_TABLET | Freq: Three times a day (TID) | ORAL | 0 refills | Status: DC

## 2024-05-18 MED ORDER — METRONIDAZOLE 500 MG PO TABS: 500.0000 mg | ORAL_TABLET | Freq: Three times a day (TID) | ORAL | 0 refills | Status: AC

## 2024-05-18 MED ORDER — METRONIDAZOLE 500 MG PO TABS: 500.0000 mg | ORAL_TABLET | Freq: Two times a day (BID) | ORAL | 0 refills | Status: DC

## 2024-05-18 NOTE — Progress Notes (Signed)
 Primary Care Physician:  Atilano Deward ORN, MD Primary Gastroenterologist:  Dr. Shaaron  Pre-Procedure History & Physical: HPI:  Monica [name] is a 83 y.o. female here for 83 year old lady recently hospitalized for what appeared to be uncomplicated sigmoid diverticulitis.  Failed to improve on oral antibioticc -monotherapy as an outpatient.  Treated inpatient for a couple of days was discharged on Augmentin .  Pain and nausea has not totally subsided.  She still has issues with nausea and perceived constipation - intolerant of Linzess.  Takes MiraLAX  every day and Benefiber.  No fever.  Has not had any pneumaturia.  No bleeding.  Last colonoscopy 5 years ago diverticulosis. Very large hiatal hernia.  Some early satiety with this acute illness.  Takes Protonix  only once daily.  Zofran  prescribed for nausea.  Hx of indolent MALToma surveillance with EGD periodically at the direction of oncology.   Past Medical History:  Diagnosis Date   Cancer (HCC)    NHL, Malt Lymphoma   Depression    Diverticulitis 04/28/2020   DM (diabetes mellitus) (HCC)    TYPE  2   Dysphagia    Dysrhythmia    History of palpatations   GERD (gastroesophageal reflux disease)    INGESTION    Heart palpitations    Hiatal hernia    Hyperlipidemia    Interstitial cystitis    Iron deficiency 04/28/2020   Lung cancer, lower lobe (HCC) 07/21/2018   Left lower lobe, stage Ia squamous cell carcinoma   MALT (mucosa associated lymphoid tissue)    gastric   Sinusitis     Past Surgical History:  Procedure Laterality Date   ABDOMINAL HYSTERECTOMY     BIOPSY  09/01/2016   Procedure: BIOPSY;  Surgeon: Lamar CHRISTELLA Shaaron, MD;  [facility]: [facility];  Service: Endoscopy;;  gastric   BIOPSY  11/29/2018   Procedure: BIOPSY;  Surgeon: Shaaron Lamar CHRISTELLA, MD;  [facility]: [facility];  Service: Endoscopy;;  right colon   BLADDER SURGERY     CATARACT EXTRACTION W/ INTRAOCULAR LENS  IMPLANT, BILATERAL  2015   CATARACT EXTRACTION,  BILATERAL     COLONOSCOPY     Dr. Barry 2009: Normal per PCP notes   COLONOSCOPY N/A 11/29/2018   Amarah Brossman: Diverticulosis, 3 polyps removed.  No evidence of colitis.  Tubular adenomas.  No future surveillance colonoscopies recommended due to age.   ESOPHAGOGASTRODUODENOSCOPY     RMR: Prominant Schzgzki ring/component of peptic stricture status post dilation and disruption as described above, otherwise norma esophagus, moderate-sized hiatal hernia, antal pyloric channel, and posterier bulbar erosions, otherwise unremarkable stomach, D1 and D2 . Inflammatory findings on the stomach and duodenum will likely be related to aspirin  effect. We need to rule out Helicobacter pylor   ESOPHAGOGASTRODUODENOSCOPY N/A 03/10/2015   Dr. 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 Pathology with lymphoid population of stomach, slight atypia   ESOPHAGOGASTRODUODENOSCOPY N/A 02/03/2016   Dr. Shaaron: Schatzki ring noted at GE junction, dilated with 56 and then 58 French Maloney dilator. Large hiatal hernia. 6 x 7 cm nodular geographically ulcerated mucosa, biopsy c/w MALToma   ESOPHAGOGASTRODUODENOSCOPY N/A 04/08/2016   Dr. Shaaron: moderate Schatzki's ring/web s/p dilation, large hiatal hernia, localized area of gastric lymphoma visualized and appeared to be much improved. normal second portion of the duodenum. No specimens collected. Esophageal lumen notably tighted up significantly since her dilation in April of this year.    ESOPHAGOGASTRODUODENOSCOPY N/A 08/04/2016   Procedure: ESOPHAGOGASTRODUODENOSCOPY (  EGD);  Surgeon: Lamar CHRISTELLA Hollingshead, MD;  [facility]: [facility];  Service: Endoscopy;  Laterality: N/A;  7:30 am   ESOPHAGOGASTRODUODENOSCOPY N/A 09/01/2016   Procedure: ESOPHAGOGASTRODUODENOSCOPY (EGD);  Surgeon: Lamar CHRISTELLA Hollingshead, MD;  [facility]: [facility];  Service: Endoscopy;  Laterality: N/A;  830    ESOPHAGOGASTRODUODENOSCOPY N/A 05/10/2017   Dr. Hollingshead:  Moderate Schatzki ring at the GE junction, status post dilation with 54 [place].  Medium sized hiatal hernia.  Few localized erosions in the gastric antrum.  Stomach biopsy showed chronic gastritis, no H. pylori.  No atypical lymphoid infiltrates or other features of lymphoproliferative process   ESOPHAGOGASTRODUODENOSCOPY N/A 06/13/2018   Dr. Hollingshead: Schatzki ring status post dilation.  Large hiatal hernia.  Focal area 4 x 4 cm along the greater curvature, somewhat erythematous and thickened mucosa, biopsy benign.  Next EGD in August 2020.   ESOPHAGOGASTRODUODENOSCOPY N/A 01/14/2021   Procedure: ESOPHAGOGASTRODUODENOSCOPY (EGD);  Surgeon: Hollingshead Lamar CHRISTELLA, MD;  [facility]: [facility];  Service: Endoscopy;  Laterality: N/A;  AM   ESOPHAGOGASTRODUODENOSCOPY (EGD) WITH PROPOFOL  N/A 06/16/2023   Procedure: ESOPHAGOGASTRODUODENOSCOPY (EGD) WITH PROPOFOL ;  Surgeon: Hollingshead Lamar CHRISTELLA, MD;  [facility]: [facility];  Service: Endoscopy;  Laterality: N/A;  1:30 pm, asa 3   EYE SURGERY     INTERCOSTAL NERVE BLOCK Right 09/10/2019   Procedure: Intercostal Nerve Block;  Surgeon: Kerrin Elspeth BROCKS, MD;  Location: [facility];  Service: Thoracic;  Laterality: Right;   LOBECTOMY Left 05/19/2018   Procedure: LEFT LOWER LOBECTOMY;  Surgeon: Kerrin Elspeth BROCKS, MD;  Location: [facility] OR;  Service: Thoracic;  Laterality: Left;   LYMPH NODE DISSECTION Right 09/10/2019   Procedure: Lymph Node Dissection;  Surgeon: Kerrin Elspeth BROCKS, MD;  Location: [facility] OR;  Service: Thoracic;  Laterality: Right;   MALONEY DILATION N/A 03/10/2015   Procedure: AGAPITO HODGKIN;  Surgeon: Lamar CHRISTELLA Hollingshead, MD;  [facility]: [facility];  Service: Endoscopy;  Laterality: N/A;   MALONEY DILATION N/A 02/03/2016   Procedure: AGAPITO DILATION;  Surgeon: Lamar CHRISTELLA Hollingshead, MD;  [facility];  Service: Endoscopy;  Laterality: N/A;   MALONEY DILATION N/A 04/08/2016   Procedure: AGAPITO DILATION;  Surgeon: Lamar CHRISTELLA Hollingshead, MD;  [facility];   Service: Endoscopy;  Laterality: N/A;   MALONEY DILATION N/A 05/10/2017   Procedure: AGAPITO DILATION;  Surgeon: Hollingshead Lamar CHRISTELLA, MD;  [facility];  Service: Endoscopy;  Laterality: N/A;   MALONEY DILATION N/A 06/13/2018   Procedure: AGAPITO DILATION;  Surgeon: Hollingshead Lamar CHRISTELLA, MD;  [facility];  Service: Endoscopy;  Laterality: N/A;   MALONEY DILATION N/A 01/14/2021   Procedure: AGAPITO DILATION;  Surgeon: Hollingshead Lamar CHRISTELLA, MD;  [facility];  Service: Endoscopy;  Laterality: N/A;   MALONEY DILATION N/A 06/16/2023   Procedure: AGAPITO DILATION;  Surgeon: Hollingshead Lamar CHRISTELLA, MD;  [facility];  Service: Endoscopy;  Laterality: N/A;   POLYPECTOMY  11/29/2018   Procedure: POLYPECTOMY;  Surgeon: Hollingshead Lamar CHRISTELLA, MD;  [facility];  Service: Endoscopy;;   VIDEO ASSISTED THORACOSCOPY (VATS)/ LOBECTOMY Right 09/10/2019   Procedure: VIDEO ASSISTED THORACOSCOPY (VATS)/RIGHT LOWER LOBECTOMY;  Surgeon: Kerrin Elspeth BROCKS, MD;  Location: [facility] OR;  Service: Thoracic;  Laterality: Right;   VIDEO ASSISTED THORACOSCOPY (VATS)/WEDGE RESECTION Left 05/19/2018   Procedure: VIDEO ASSISTED THORACOSCOPY (VATS)/WEDGE RESECTION;  Surgeon: Kerrin Elspeth BROCKS, MD;  Location: [facility] OR;  Service: Thoracic;  Laterality: Left;    Prior to Admission medications  Medication Sig Start Date End Date Taking? Authorizing Provider  albuterol  (VENTOLIN  HFA) 108 (90 Base) MCG/ACT inhaler Inhale 2 puffs into the lungs 4 (four) times daily. 08/13/23  Yes [provider]  apixaban  (ELIQUIS ) 5 MG TABS tablet TAKE 1 TABLET BY MOUTH TWICE  DAILY 01/24/24  Yes Branch, Dorn FALCON, MD  cholecalciferol (VITAMIN D3) 25 MCG (1000 UNIT) tablet Take 1,000 Units by mouth daily.   Yes [provider]  ezetimibe  (ZETIA ) 10 MG tablet Take 10 mg by mouth at bedtime. 03/03/23  Yes [provider]  ferrous sulfate 325 (65 FE) MG tablet Take 325 mg by mouth daily with breakfast.    Yes [provider]  losartan  (COZAAR ) 25 MG tablet Take 1 tablet (25 mg total) by mouth daily. 03/12/24  Yes Ricky Fines, MD  Metoprolol  Tartrate 37.5 MG TABS TAKE 1 TABLET BY MOUTH TWICE  DAILY 12/20/23  Yes Branch, Dorn FALCON, MD  Multiple Vitamin (MULTIVITAMIN WITH MINERALS) TABS tablet Take 1 tablet by mouth daily.   Yes [provider]  nitroGLYCERIN  (NITROSTAT ) 0.4 MG SL tablet Place 1 tablet (0.4 mg total) under the tongue every 5 (five) minutes x 3 doses as needed for chest pain (If no relief after 3rd dose, GO TO ED). 02/08/23 05/18/24 Yes Miriam Norris, NP  ondansetron  (ZOFRAN -ODT) 4 MG disintegrating tablet Take 1 tablet (4 mg total) by mouth 3 (three) times daily as needed for nausea or vomiting. 04/19/24  Yes Shirlean Therisa ORN, NP  pantoprazole  (PROTONIX ) 40 MG tablet Take 1 tablet (40 mg total) by mouth 2 (two) times daily. 03/12/24  Yes Ricky Fines, MD  pioglitazone  (ACTOS ) 15 MG tablet Take 15 mg by mouth daily.   Yes [provider]  polyethylene glycol powder (GLYCOLAX /MIRALAX ) 17 GM/SCOOP powder Take 17 g by mouth daily.   Yes [provider]  prochlorperazine  (COMPAZINE ) 10 MG tablet Take 1 tablet (10 mg total) by mouth every 6 (six) hours as needed for nausea or vomiting. Patient not taking: Reported on 05/18/2024 03/12/24   Ricky Fines, MD    Allergies as of 05/18/2024 - Review Complete 05/18/2024  Allergen Reaction Noted   Elemental sulfur  Hives 06/04/2016    Family History  Problem Relation Age of Onset   Heart attack Father    Dementia Father    Diabetes Sister    Diabetes Brother    Diabetes Sister    Colon cancer Neg Hx     Social History   Socioeconomic History   Marital status: Married    Spouse name: Not on file   Number of children: Not on file   Years of education: Not on file   Highest education level: Not on file  Occupational History   Not on file  Tobacco Use   Smoking status: Some Days    Current  packs/day: 0.00    Average packs/day: 0.5 packs/day for 57.0 years (28.5 ttl pk-yrs)    Types: Cigarettes    Start date: 08/01/1965    Last attempt to quit: 08/01/2022    Years since quitting: 1.7    Passive exposure: Never   Smokeless tobacco: Never  Vaping Use   Vaping status: Never Used  Substance and Sexual Activity   Alcohol  use: No    Alcohol /week: 0.0 standard drinks of alcohol    Drug use: No   Sexual activity: Not Currently  Other Topics Concern   Not on file  Social History Narrative   Not on file   Social Drivers  of Health   Financial Resource Strain: Not on file  Food Insecurity: No Food Insecurity (03/10/2024)   Hunger Vital Sign    Worried About Running Out of Food in the Last Year: Never true    Ran Out of Food in the Last Year: Never true  Transportation Needs: No Transportation Needs (03/10/2024)   PRAPARE - Administrator, Civil Service (Medical): No    Lack of Transportation (Non-Medical): No  Physical Activity: Not on file  Stress: Not on file  Social Connections: Socially Integrated (03/10/2024)   Social Connection and Isolation Panel    Frequency of Communication with Friends and Family: Three times a week    Frequency of Social Gatherings with Friends and Family: Once a week    Attends Religious Services: More than 4 times per year    Active Member of [employer] or Organizations: Yes    Attends Engineer, structural: More than 4 times per year    Marital Status: Married  Catering manager Violence: Not At Risk (03/10/2024)   Humiliation, Afraid, Rape, and Kick questionnaire    Fear of Current or Ex-Partner: No    Emotionally Abused: No    Physically Abused: No    Sexually Abused: No    Review of Systems: See HPI, otherwise negative ROS  Physical Exam: BP 138/87   Pulse 78   Temp 98.6 F (37 C)   Ht 5' 6 (1.676 m)   Wt 170 lb 9.6 oz (77.4 kg)   BMI 27.54 kg/m  General:   Alert,  Well-developed, well-nourished, pleasant and  cooperative in NAD accompanied by her husband. Lungs:  Clear throughout to auscultation.   No wheezes, crackles, or rhonchi. No acute distress. Heart:  Regular rate and rhythm; no murmurs, clicks, rubs,  or gallops. Abdomen: Nondistended.  She does have bilateral lower quadrant suprapubic tenderness to palpation.  No appreciable mass organomegaly pulses:  Normal pulses noted.   Impression/Plan: 83 year old lady with what appears to be acute diverticulitis on recent imaging.  Uncomplicated.  She has not fully recovered after taking Augmentin .  Associated nausea, early satiety, large hiatal hernia.  Of note, she is lost has pounds in the past 2 years based on our scales.  She does not look acutely ill or toxic.  I do not think repeat imaging is warranted at this time.  She will need an eventual colonoscopy.  Recommendations:  Cipro  500 mg orally twice daily x 10 days no refills  Metronidazole  500 mg orally 3 times a day x 10 days no refills  New prescription for pantoprazole  40 mg.  Take (1) 40 mg pill before breakfast and supper daily.  Dispense 60 with 11 refills.  Back off to a low residue/clear liquid diet over the weekend.   Advance your diet next week as tolerated.  Stop fiber supplement until instructed otherwise  Office visit here in 1 month  Eventual colonoscopy later in the year  Plan for repeat upper endoscopy in 2 years per oncology recommendations.    Notice: This dictation was prepared with Dragon dictation along with smaller phrase technology. Any transcriptional errors that result from this process are unintentional and may not be corrected upon review.

## 2024-05-18 NOTE — Patient Instructions (Addendum)
 Nice to see you again today!  You need another course of antibiotics to completely get rid of diverticulitis  Cipro 500 mg orally twice daily x 10 days no refills  Metronidazole  500 mg orally 3 times a day x 10 days no refills  New prescription for pantoprazole  40 mg.  Take (1) 40 mg pill before breakfast and supper daily.  Dispense 60 with 11 refills.  Back off to a low residue/clear liquid diet over the weekend to allow your colon to heal.  Advance your diet next week as tolerated.  Stop your fiber supplement until instructed otherwise  Office visit here in 1 month  You will need eventual colonoscopy a little bit later in the year  Plan for repeat upper endoscopy in 2 years per oncology recommendations.

## 2024-06-05 ENCOUNTER — Ambulatory Visit: Admitting: Internal Medicine

## 2024-06-19 ENCOUNTER — Encounter: Payer: Self-pay | Admitting: Internal Medicine

## 2024-06-19 ENCOUNTER — Ambulatory Visit: Admitting: Internal Medicine

## 2024-06-19 DIAGNOSIS — K449 Diaphragmatic hernia without obstruction or gangrene: Secondary | ICD-10-CM

## 2024-06-19 DIAGNOSIS — Z8719 Personal history of other diseases of the digestive system: Secondary | ICD-10-CM | POA: Diagnosis not present

## 2024-06-19 DIAGNOSIS — R1310 Dysphagia, unspecified: Secondary | ICD-10-CM

## 2024-06-19 DIAGNOSIS — K219 Gastro-esophageal reflux disease without esophagitis: Secondary | ICD-10-CM | POA: Diagnosis not present

## 2024-06-19 DIAGNOSIS — K5732 Diverticulitis of large intestine without perforation or abscess without bleeding: Secondary | ICD-10-CM

## 2024-06-19 MED ORDER — PANTOPRAZOLE SODIUM 40 MG PO TBEC: 40.0000 mg | DELAYED_RELEASE_TABLET | Freq: Two times a day (BID) | ORAL | 3 refills | Status: AC

## 2024-06-19 NOTE — Progress Notes (Signed)
 Primary Care Physician:  Atilano Deward ORN, MD Primary Gastroenterologist:  Dr. Shaaron  Pre-Procedure History & Physical: HPI:  Monica [name] is a 83 y.o. female here for follow-up of presumed sigmoid diverticulitis abnormal sigmoid colon on CT earlier this year consistent with diverticulitis.  Did not improve with initial course of antibiotics.  Subsequently went on Cipro  and Flagyl  after being seen here.  She is back to say she is very pleased no abdominal pain no nausea eating well weight up 3 pounds very pleased GERD well-controlled on pantoprazole .  However, she has developed recurrent esophageal dysphagia she has a known peptic stricture and a large hiatal hernia.  Has undergone multiple dilations in the past.  History of MALToma.  No H. pylori indolent on follow-up EGDs.  History of colonic adenoma on last colonoscopy-2020.  Does use MiraLAX  on occasion for constipation.  Past Medical History:  Diagnosis Date   Cancer (HCC)    NHL, Malt Lymphoma   Depression    Diverticulitis 04/28/2020   DM (diabetes mellitus) (HCC)    TYPE  2   Dysphagia    Dysrhythmia    History of palpatations   GERD (gastroesophageal reflux disease)    INGESTION    Heart palpitations    Hiatal hernia    Hyperlipidemia    Interstitial cystitis    Iron deficiency 04/28/2020   Lung cancer, lower lobe (HCC) 07/21/2018   Left lower lobe, stage Ia squamous cell carcinoma   MALT (mucosa associated lymphoid tissue)    gastric   Sinusitis     Past Surgical History:  Procedure Laterality Date   ABDOMINAL HYSTERECTOMY     BIOPSY  09/01/2016   Procedure: BIOPSY;  Surgeon: Lamar CHRISTELLA Shaaron, MD;  [facility];  Service: Endoscopy;;  gastric   BIOPSY  11/29/2018   Procedure: BIOPSY;  Surgeon: Shaaron Lamar CHRISTELLA, MD;  [facility]: [facility];  Service: Endoscopy;;  right colon   BLADDER SURGERY     CATARACT EXTRACTION W/ INTRAOCULAR LENS  IMPLANT, BILATERAL  2015   CATARACT EXTRACTION, BILATERAL      COLONOSCOPY     Dr. Barry 2009: Normal per PCP notes   COLONOSCOPY N/A 11/29/2018   Lamona Eimer: Diverticulosis, 3 polyps removed.  No evidence of colitis.  Tubular adenomas.  No future surveillance colonoscopies recommended due to age.   ESOPHAGOGASTRODUODENOSCOPY     RMR: Prominant Schzgzki ring/component of peptic stricture status post dilation and disruption as described above, otherwise norma esophagus, moderate-sized hiatal hernia, antal pyloric channel, and posterier bulbar erosions, otherwise unremarkable stomach, D1 and D2 . Inflammatory findings on the stomach and duodenum will likely be related to aspirin  effect. We need to rule out Helicobacter pylor   ESOPHAGOGASTRODUODENOSCOPY N/A 03/10/2015   Dr. 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 Pathology with lymphoid population of stomach, slight atypia   ESOPHAGOGASTRODUODENOSCOPY N/A 02/03/2016   Dr. Shaaron: Schatzki ring noted at GE junction, dilated with 56 and then 58 French Maloney dilator. Large hiatal hernia. 6 x 7 cm nodular geographically ulcerated mucosa, biopsy c/w MALToma   ESOPHAGOGASTRODUODENOSCOPY N/A 04/08/2016   Dr. Shaaron: moderate Schatzki's ring/web s/p dilation, large hiatal hernia, localized area of gastric lymphoma visualized and appeared to be much improved. normal second portion of the duodenum. No specimens collected. Esophageal lumen notably tighted up significantly since her dilation in April of this year.    ESOPHAGOGASTRODUODENOSCOPY N/A 08/04/2016   Procedure: ESOPHAGOGASTRODUODENOSCOPY (EGD);  Surgeon:  Lamar CHRISTELLA Hollingshead, MD;  [facility]: [facility];  Service: Endoscopy;  Laterality: N/A;  7:30 am   ESOPHAGOGASTRODUODENOSCOPY N/A 09/01/2016   Procedure: ESOPHAGOGASTRODUODENOSCOPY (EGD);  Surgeon: Lamar CHRISTELLA Hollingshead, MD;  [facility]: [facility];  Service: Endoscopy;  Laterality: N/A;  830    ESOPHAGOGASTRODUODENOSCOPY N/A 05/10/2017   Dr. Hollingshead: Moderate  Schatzki ring at the GE junction, status post dilation with 54 [place].  Medium sized hiatal hernia.  Few localized erosions in the gastric antrum.  Stomach biopsy showed chronic gastritis, no H. pylori.  No atypical lymphoid infiltrates or other features of lymphoproliferative process   ESOPHAGOGASTRODUODENOSCOPY N/A 06/13/2018   Dr. Hollingshead: Schatzki ring status post dilation.  Large hiatal hernia.  Focal area 4 x 4 cm along the greater curvature, somewhat erythematous and thickened mucosa, biopsy benign.  Next EGD in August 2020.   ESOPHAGOGASTRODUODENOSCOPY N/A 01/14/2021   Procedure: ESOPHAGOGASTRODUODENOSCOPY (EGD);  Surgeon: Hollingshead Lamar CHRISTELLA, MD;  [facility]: [facility];  Service: Endoscopy;  Laterality: N/A;  AM   ESOPHAGOGASTRODUODENOSCOPY (EGD) WITH PROPOFOL  N/A 06/16/2023   Procedure: ESOPHAGOGASTRODUODENOSCOPY (EGD) WITH PROPOFOL ;  Surgeon: Hollingshead Lamar CHRISTELLA, MD;  [facility];  Service: Endoscopy;  Laterality: N/A;  1:30 pm, asa 3   EYE SURGERY     INTERCOSTAL NERVE BLOCK Right 09/10/2019   Procedure: Intercostal Nerve Block;  Surgeon: Kerrin Elspeth BROCKS, MD;  Location: [facility] OR;  Service: Thoracic;  Laterality: Right;   LOBECTOMY Left 05/19/2018   Procedure: LEFT LOWER LOBECTOMY;  Surgeon: Kerrin Elspeth BROCKS, MD;  Location: [facility] OR;  Service: Thoracic;  Laterality: Left;   LYMPH NODE DISSECTION Right 09/10/2019   Procedure: Lymph Node Dissection;  Surgeon: Kerrin Elspeth BROCKS, MD;  Location[facility] OR;  Service: Thoracic;  Laterality: Right;   MALONEY DILATION N/A 03/10/2015   Procedure: AGAPITO HODGKIN;  Surgeon: Lamar CHRISTELLA Hollingshead, MD;  [facility];  Service: Endoscopy;  Laterality: N/A;   MALONEY DILATION N/A 02/03/2016   Procedure: AGAPITO DILATION;  Surgeon: Lamar CHRISTELLA Hollingshead, MD;  [facility];  Service: Endoscopy;  Laterality: N/A;   MALONEY DILATION N/A 04/08/2016   Procedure: AGAPITO DILATION;  Surgeon: Lamar CHRISTELLA Hollingshead, MD;  [facility];  Service:  Endoscopy;  Laterality: N/A;   MALONEY DILATION N/A 05/10/2017   Procedure: AGAPITO DILATION;  Surgeon: Hollingshead Lamar CHRISTELLA, MD;  [facility];  Service: Endoscopy;  Laterality: N/A;   MALONEY DILATION N/A 06/13/2018   Procedure: AGAPITO DILATION;  Surgeon: Hollingshead Lamar CHRISTELLA, MD;  [facility];  Service: Endoscopy;  Laterality: N/A;   MALONEY DILATION N/A 01/14/2021   Procedure: AGAPITO DILATION;  Surgeon: Hollingshead Lamar CHRISTELLA, MD;  [facility];  Service: Endoscopy;  Laterality: N/A;   MALONEY DILATION N/A 06/16/2023   Procedure: AGAPITO DILATION;  Surgeon: Hollingshead Lamar CHRISTELLA, MD;  [facility];  Service: Endoscopy;  Laterality: N/A;   POLYPECTOMY  11/29/2018   Procedure: POLYPECTOMY;  Surgeon: Hollingshead Lamar CHRISTELLA, MD;  [facility];  Service: Endoscopy;;   VIDEO ASSISTED THORACOSCOPY (VATS)/ LOBECTOMY Right 09/10/2019   Procedure: VIDEO ASSISTED THORACOSCOPY (VATS)/RIGHT LOWER LOBECTOMY;  Surgeon: Kerrin Elspeth BROCKS, MD;  Location: [facility] OR;  Service: Thoracic;  Laterality: Right;   VIDEO ASSISTED THORACOSCOPY (VATS)/WEDGE RESECTION Left 05/19/2018   Procedure: VIDEO ASSISTED THORACOSCOPY (VATS)/WEDGE RESECTION;  Surgeon: Kerrin Elspeth BROCKS, MD;  Location: [facility] OR;  Service: Thoracic;  Laterality: Left;    Prior to Admission medications   Medication  Sig Start Date End Date Taking? Authorizing Provider  albuterol  (VENTOLIN  HFA) 108 (90 Base) MCG/ACT inhaler Inhale 2 puffs into the lungs 4 (four) times daily. 08/13/23  Yes [provider]  apixaban  (ELIQUIS ) 5 MG TABS tablet TAKE 1 TABLET BY MOUTH TWICE  DAILY 01/24/24  Yes Branch, Dorn FALCON, MD  cholecalciferol (VITAMIN D3) 25 MCG (1000 UNIT) tablet Take 1,000 Units by mouth daily.   Yes [provider]  ezetimibe  (ZETIA ) 10 MG tablet Take 10 mg by mouth at bedtime. 03/03/23  Yes [provider]  ferrous sulfate 325 (65 FE) MG tablet Take 325 mg by mouth daily with breakfast.   Yes  [provider]  losartan  (COZAAR ) 25 MG tablet Take 1 tablet (25 mg total) by mouth daily. 03/12/24  Yes Ricky Fines, MD  Metoprolol  Tartrate 37.5 MG TABS TAKE 1 TABLET BY MOUTH TWICE  DAILY 12/20/23  Yes Branch, Dorn FALCON, MD  Multiple Vitamin (MULTIVITAMIN WITH MINERALS) TABS tablet Take 1 tablet by mouth daily.   Yes [provider]  nitroGLYCERIN  (NITROSTAT ) 0.4 MG SL tablet Place 1 tablet (0.4 mg total) under the tongue every 5 (five) minutes x 3 doses as needed for chest pain (If no relief after 3rd dose, GO TO ED). 02/08/23 06/19/24 Yes Miriam Norris, NP  ondansetron  (ZOFRAN -ODT) 4 MG disintegrating tablet Take 1 tablet (4 mg total) by mouth 3 (three) times daily as needed for nausea or vomiting. 04/19/24  Yes Shirlean Therisa ORN, NP  pantoprazole  (PROTONIX ) 40 MG tablet Take 1 tablet (40 mg total) by mouth 2 (two) times daily. 05/18/24  Yes Ketura Sirek, Lamar HERO, MD  pioglitazone  (ACTOS ) 15 MG tablet Take 15 mg by mouth daily.   Yes [provider]  polyethylene glycol powder (GLYCOLAX /MIRALAX ) 17 GM/SCOOP powder Take 17 g by mouth daily.   Yes [provider]    Allergies as of 06/19/2024 - Review Complete 06/19/2024  Allergen Reaction Noted   Elemental sulfur  Hives 06/04/2016    Family History  Problem Relation Age of Onset   Heart attack Father    Dementia Father    Diabetes Sister    Diabetes Brother    Diabetes Sister    Colon cancer Neg Hx     Social History   Socioeconomic History   Marital status: Married    Spouse name: Not on file   Number of children: Not on file   Years of education: Not on file   Highest education level: Not on file  Occupational History   Not on file  Tobacco Use   Smoking status: Some Days    Current packs/day: 0.00    Average packs/day: 0.5 packs/day for 57.0 years (28.5 ttl pk-yrs)    Types: Cigarettes    Start date: 08/01/1965    Last attempt to quit: 08/01/2022    Years since quitting: 1.8    Passive  exposure: Never   Smokeless tobacco: Never  Vaping Use   Vaping status: Never Used  Substance and Sexual Activity   Alcohol  use: No    Alcohol /week: 0.0 standard drinks of alcohol    Drug use: No   Sexual activity: Not Currently  Other Topics Concern   Not on file  Social History Narrative   Not on file   Social Drivers of Health   Financial Resource Strain: Not on file  Food Insecurity: No Food Insecurity (03/10/2024)   Hunger Vital Sign    Worried About Running Out of Food in the Last Year: Never  true    Ran Out of Food in the Last Year: Never true  Transportation Needs: No Transportation Needs (03/10/2024)   PRAPARE - Administrator, Civil Service (Medical): No    Lack of Transportation (Non-Medical): No  Physical Activity: Not on file  Stress: Not on file  Social Connections: Socially Integrated (03/10/2024)   Social Connection and Isolation Panel    Frequency of Communication with Friends and Family: Three times a week    Frequency of Social Gatherings with Friends and Family: Once a week    Attends Religious Services: More than 4 times per year    Active Member of [employer] or Organizations: Yes    Attends Engineer, structural: More than 4 times per year    Marital Status: Married  Catering manager Violence: Not At Risk (03/10/2024)   Humiliation, Afraid, Rape, and Kick questionnaire    Fear of Current or Ex-Partner: No    Emotionally Abused: No    Physically Abused: No    Sexually Abused: No    Review of Systems: See HPI, otherwise negative ROS  Physical Exam: BP 108/65 (BP Location: Right Arm, Patient Position: Sitting, Cuff Size: Normal)   Pulse 76   Temp 98.4 F (36.9 C) (Oral)   Ht 5' 6 (1.676 m)   Wt 173 lb (78.5 kg)   SpO2 98%   BMI 27.92 kg/m  General:   Alert,  pleasant and cooperative in NAD; accompanied by her husband. Neck:  Supple; no masses or thyromegaly. No significant cervical adenopathy. Lungs:  Clear throughout to  auscultation.   No wheezes, crackles, or rhonchi. No acute distress. Heart: Irregularly irregular rhythm.  no murmurs, clicks, rubs,  or gallops. Abdomen: Non-distended, normal bowel sounds.  Soft and nontender without appreciable mass or hepatosplenomegaly.   Impression/Plan: 84 year old lady with abdominal pain abnormal sigmoid colon on prior CT diagnosed with diverticulitis.  Symptoms finally resolved with a course of Cipro  and Flagyl .  Discussed need for a follow-up colonoscopy.  Longstanding GERD in the setting of a hiatal hernia requiring twice daily PPI therapy.  Known peptic stricture/Schatzki's ring warranting dilation multiple times in the past.  She has had as recurrent esophageal dysphagia and desires her esophagus to be stretched again.  Indolent MALToma.  No strong indications for surveillance at this time.  Surveillance directed by oncology.  Recommendations  Diagnostic colonoscopy to follow-up on abnormal CT scan.  The risks, benefits, limitations, alternatives and imponderables have been reviewed with the patient. Questions have been answered. All parties are agreeable.    EGD with esophageal dilation is feasible/appropriate.  Will do at the same time as colonoscopy. The risks, benefits, limitations, alternatives and imponderables have been reviewed with the patient. Potential for esophageal dilation, biopsy, etc. have also been reviewed.  Questions have been answered. All parties agreeable.   Hold Eliquis  2 days prior to the procedure.  Continue pantoprazole  40 mg twice daily before supper and breakfast.  Dispense 180 with 3 additional refills.      Notice: This dictation was prepared with Dragon dictation along with smaller phrase technology. Any transcriptional errors that result from this process are unintentional and may not be corrected upon review.

## 2024-06-19 NOTE — Patient Instructions (Addendum)
 As discussed, we will set up EGD with esophageal dilation.  ASA 3 (room 1 okay)  Diagnostic colonoscopy to follow-up on abnormal CT scan.  Hold Eliquis  2 days prior to the procedure.  Continue pantoprazole  40 mg twice daily before supper and breakfast.  Dispense 180 with 3 additional refills.

## 2024-06-20 ENCOUNTER — Telehealth: Payer: Self-pay | Admitting: *Deleted

## 2024-06-20 ENCOUNTER — Encounter (HOSPITAL_COMMUNITY): Payer: Self-pay | Admitting: Emergency Medicine

## 2024-06-20 ENCOUNTER — Emergency Department (HOSPITAL_COMMUNITY)

## 2024-06-20 ENCOUNTER — Other Ambulatory Visit: Payer: Self-pay

## 2024-06-20 ENCOUNTER — Emergency Department (HOSPITAL_COMMUNITY)
Admission: EM | Admit: 2024-06-20 | Discharge: 2024-06-20 | Disposition: A | Discharge status: Home / Self Care | Attending: Emergency Medicine | Admitting: Emergency Medicine

## 2024-06-20 DIAGNOSIS — K4020 Bilateral inguinal hernia, without obstruction or gangrene, not specified as recurrent: Secondary | ICD-10-CM | POA: Diagnosis not present

## 2024-06-20 DIAGNOSIS — N2889 Other specified disorders of kidney and ureter: Secondary | ICD-10-CM | POA: Diagnosis not present

## 2024-06-20 DIAGNOSIS — I700 Atherosclerosis of aorta: Secondary | ICD-10-CM | POA: Diagnosis not present

## 2024-06-20 DIAGNOSIS — J9811 Atelectasis: Secondary | ICD-10-CM | POA: Diagnosis not present

## 2024-06-20 DIAGNOSIS — K449 Diaphragmatic hernia without obstruction or gangrene: Secondary | ICD-10-CM | POA: Insufficient documentation

## 2024-06-20 DIAGNOSIS — N323 Diverticulum of bladder: Secondary | ICD-10-CM | POA: Diagnosis not present

## 2024-06-20 DIAGNOSIS — Z7901 Long term (current) use of anticoagulants: Secondary | ICD-10-CM | POA: Diagnosis not present

## 2024-06-20 DIAGNOSIS — R079 Chest pain, unspecified: Secondary | ICD-10-CM | POA: Diagnosis not present

## 2024-06-20 DIAGNOSIS — R0789 Other chest pain: Secondary | ICD-10-CM | POA: Diagnosis not present

## 2024-06-20 DIAGNOSIS — R1013 Epigastric pain: Secondary | ICD-10-CM | POA: Diagnosis present

## 2024-06-20 LAB — HEPATIC FUNCTION PANEL
ALT: 17 U/L (ref 0–44)
AST: 21 U/L (ref 15–41)
Albumin: 3.8 g/dL (ref 3.5–5.0)
Alkaline Phosphatase: 79 U/L (ref 38–126)
Bilirubin, Direct: 0.1 mg/dL (ref 0.0–0.2)
Indirect Bilirubin: 0.7 mg/dL (ref 0.3–0.9)
Total Bilirubin: 0.8 mg/dL (ref 0.0–1.2)
Total Protein: 7.8 g/dL (ref 6.5–8.1)

## 2024-06-20 LAB — CBC
HCT: 44.4 % (ref 36.0–46.0)
Hemoglobin: 14.6 g/dL (ref 12.0–15.0)
MCH: 29.1 pg (ref 26.0–34.0)
MCHC: 32.9 g/dL (ref 30.0–36.0)
MCV: 88.6 fL (ref 80.0–100.0)
Platelets: 240 K/uL (ref 150–400)
RBC: 5.01 MIL/uL (ref 3.87–5.11)
RDW: 14.8 % (ref 11.5–15.5)
WBC: 12 K/uL — ABNORMAL HIGH (ref 4.0–10.5)
nRBC: 0 % (ref 0.0–0.2)

## 2024-06-20 LAB — BASIC METABOLIC PANEL WITH GFR
Anion gap: 14 (ref 5–15)
BUN: 13 mg/dL (ref 8–23)
CO2: 23 mmol/L (ref 22–32)
Calcium: 9.2 mg/dL (ref 8.9–10.3)
Chloride: 98 mmol/L (ref 98–111)
Creatinine, Ser: 0.82 mg/dL (ref 0.44–1.00)
GFR, Estimated: >60 mL/min (ref >60)
Glucose, Bld: 216 mg/dL — ABNORMAL HIGH (ref 70–99)
Potassium: 3.8 mmol/L (ref 3.5–5.1)
Sodium: 135 mmol/L (ref 135–145)

## 2024-06-20 LAB — LIPASE, BLOOD: Lipase: 21 U/L (ref 11–51)

## 2024-06-20 LAB — TROPONIN I (HIGH SENSITIVITY)
Troponin I (High Sensitivity): 4 ng/L (ref <18)
Troponin I (High Sensitivity): 4 ng/L (ref <18)

## 2024-06-20 MED ORDER — SUCRALFATE 1 GM/10ML PO SUSP: 1.0000 g | Freq: Three times a day (TID) | ORAL | 0 refills | Status: DC

## 2024-06-20 MED ORDER — IOHEXOL 350 MG/ML SOLN
100.0000 mL | Freq: Once | INTRAVENOUS | Status: AC | PRN
  Administered 2024-06-20: 100 mL via INTRAVENOUS

## 2024-06-20 MED ORDER — HYDROCODONE-ACETAMINOPHEN 5-325 MG PO TABS: ORAL_TABLET | ORAL | 0 refills | Status: DC

## 2024-06-20 MED ORDER — PANTOPRAZOLE SODIUM 40 MG IV SOLR
40.0000 mg | Freq: Once | INTRAVENOUS | Status: AC
  Administered 2024-06-20: 40 mg via INTRAVENOUS
  Filled 2024-06-20: qty 10

## 2024-06-20 MED ORDER — HYDROMORPHONE HCL 1 MG/ML IJ SOLN
1.0000 mg | Freq: Once | INTRAMUSCULAR | Status: AC
  Administered 2024-06-20: 1 mg via INTRAVENOUS
  Filled 2024-06-20: qty 1

## 2024-06-20 MED ORDER — ONDANSETRON HCL 4 MG/2ML IJ SOLN
4.0000 mg | Freq: Once | INTRAMUSCULAR | Status: AC
  Administered 2024-06-20: 4 mg via INTRAVENOUS
  Filled 2024-06-20: qty 2

## 2024-06-20 NOTE — Discharge Instructions (Signed)
 Follow-up with your stomach doctor or your family doctor in the next week for recheck.  Return if problems

## 2024-06-20 NOTE — ED Provider Notes (Signed)
 [place] EMERGENCY DEPARTMENT AT [facility] Provider Note   CSN: [id number] Arrival date & time: 06/20/24  1414     Patient presents with: Chest Pain   Monica [name] is a 83 y.o. female.  {Add pertinent medical, surgical, social history, OB history to YEP:[id number]} Patient complains of severe epigastric pain and chest pain.   Chest Pain      Prior to Admission medications   Medication Sig Start Date End Date Taking? Authorizing Provider  acetaminophen  (TYLENOL ) 500 MG tablet Take 1,000 mg by mouth every 6 (six) hours as needed for mild pain (pain score 1-3).   Yes [provider]  albuterol  (VENTOLIN  HFA) 108 (90 Base) MCG/ACT inhaler Inhale 2 puffs into the lungs every 6 (six) hours as needed for wheezing or shortness of breath. 08/13/23  Yes [provider]  apixaban  (ELIQUIS ) 5 MG TABS tablet TAKE 1 TABLET BY MOUTH TWICE  DAILY 01/24/24  Yes Branch, Dorn FALCON, MD  cholecalciferol (VITAMIN D3) 25 MCG (1000 UNIT) tablet Take 1,000 Units by mouth daily.   Yes [provider]  ezetimibe  (ZETIA ) 10 MG tablet Take 10 mg by mouth at bedtime. 03/03/23  Yes [provider]  ferrous sulfate 325 (65 FE) MG tablet Take 325 mg by mouth daily with breakfast.   Yes [provider]  HYDROcodone -acetaminophen  (NORCO/VICODIN) 5-325 MG tablet Take 1 every 6 hours for pain that is not helped by Tylenol  alone 06/20/24  Yes Harjas Biggins, MD  losartan  (COZAAR ) 25 MG tablet Take 1 tablet (25 mg total) by mouth daily. 03/12/24  Yes Ricky Fines, MD  Metoprolol  Tartrate 37.5 MG TABS TAKE 1 TABLET BY MOUTH TWICE  DAILY 12/20/23  Yes Branch, Dorn FALCON, MD  Multiple Vitamin (MULTIVITAMIN WITH MINERALS) TABS tablet Take 1 tablet by mouth daily.   Yes [provider]  nitroGLYCERIN  (NITROSTAT ) 0.4 MG SL tablet Place 1 tablet (0.4 mg total) under the tongue every 5 (five) minutes x 3 doses as needed for chest pain (If no relief after 3rd dose, GO  TO ED). 02/08/23 06/20/24 Yes Miriam Norris, NP  ondansetron  (ZOFRAN -ODT) 4 MG disintegrating tablet Take 1 tablet (4 mg total) by mouth 3 (three) times daily as needed for nausea or vomiting. 04/19/24  Yes Shirlean Therisa ORN, NP  pantoprazole  (PROTONIX ) 40 MG tablet Take 1 tablet (40 mg total) by mouth 2 (two) times daily. 06/19/24  Yes Rourk, Lamar HERO, MD  pioglitazone  (ACTOS ) 15 MG tablet Take 15 mg by mouth daily.   Yes [provider]  polyethylene glycol powder (GLYCOLAX /MIRALAX ) 17 GM/SCOOP powder Take 17 g by mouth 3 (three) times a week.   Yes [provider]  sucralfate  (CARAFATE ) 1 GM/10ML suspension Take 10 mLs (1 g total) by mouth 4 (four) times daily -  with meals and at bedtime. 06/20/24  Yes Suzette Pac, MD    Allergies: Elemental sulfur     Review of Systems  Cardiovascular:  Positive for chest pain.    Updated Vital Signs BP (!) 129/97 (BP Location: Left Arm)   Pulse 74   Temp 97.7 F (36.5 C) (Oral)   Resp 17   Ht 5' 6 (1.676 m)   Wt 78.5 kg   SpO2 98%   BMI 27.92 kg/m   Physical Exam  (all labs ordered are listed, but only abnormal results are displayed) Labs Reviewed  BASIC METABOLIC PANEL WITH GFR - Abnormal; Notable for the following components:      Result Value  Glucose, Bld 216 (*)    All other components within normal limits  CBC - Abnormal; Notable for the following components:   WBC 12.0 (*)    All other components within normal limits  HEPATIC FUNCTION PANEL  LIPASE, BLOOD  TROPONIN I (HIGH SENSITIVITY)  TROPONIN I (HIGH SENSITIVITY)    EKG: EKG Interpretation Date/Time:  Wednesday June 20 2024 14:24:31 EDT Ventricular Rate:  78 PR Interval:    QRS Duration:  114 QT Interval:  408 QTC Calculation: 465 R Axis:   52  Text Interpretation: Atrial fibrillation Incomplete right bundle branch block Low voltage, precordial leads Confirmed by Suzette Pac [phone]) on 06/20/2024 3:05:24 PM  Radiology: CT Angio Chest/Abd/Pel  for Dissection W and/or Wo Contrast Result Date: 06/20/2024 CLINICAL DATA:  Acute aortic syndrome suspected. Left chest pain radiating to both shoulders. EXAM: CT ANGIOGRAPHY CHEST, ABDOMEN AND PELVIS TECHNIQUE: Non-contrast CT of the chest was initially obtained. Multidetector CT imaging through the chest, abdomen and pelvis was performed using the standard protocol during bolus administration of intravenous contrast. Multiplanar reconstructed images and MIPs were obtained and reviewed to evaluate the vascular anatomy. RADIATION DOSE REDUCTION: This exam was performed according to the departmental dose-optimization program which includes automated exposure control, adjustment of the mA and/or kV according to patient size and/or use of iterative reconstruction technique. CONTRAST:  OMNIPAQUE  IOHEXOL  350 MG/ML SOLN COMPARISON:  CT chest 11/29/2023 and CT abdomen pelvis 03/10/2024. FINDINGS: CTA CHEST FINDINGS Cardiovascular: No evidence of an intramural hematoma on precontrast imaging nor dissection or aneurysm on postcontrast imaging. Atherosclerotic calcification of the aorta, aortic valve and coronary arteries. Enlarged pulmonic trunk and heart. No pericardial effusion. Mediastinum/Nodes: Thoracic inlet lymph nodes are not enlarged by CT size criteria. No pathologically enlarged mediastinal, hilar or axillary lymph nodes. Esophagus is grossly unremarkable. Lungs/Pleura: Centrilobular and paraseptal emphysema. Bilateral lower lobectomies. Subpleural scarring in the anterior left upper lobe (8/87). Dependent left upper lobe atelectasis with a small left pleural effusion. Lungs are otherwise clear. Airway is otherwise unremarkable. Musculoskeletal: Degenerative changes in the spine. Review of the MIP images confirms the above findings. CTA ABDOMEN AND PELVIS FINDINGS VASCULAR Aorta: Atherosclerotic calcification.  No aneurysm or dissection. Celiac: Mild ostial calcification with slight narrowing. Otherwise  widely patent. SMA: Widely patent. Renals: Widely patent. IMA: Patent. Inflow: Widely patent. Veins: Poorly assessed due to lack of opacification. Review of the MIP images confirms the above findings. NON-VASCULAR Hepatobiliary: Liver and gallbladder are unremarkable. No biliary ductal dilatation. Pancreas: Negative. Spleen: Negative. Adrenals/Urinary Tract: Adrenal glands are unremarkable. Scarring in the kidneys bilaterally. Low-attenuation mass in the lower pole right kidney. No specific follow-up necessary. Ureters are decompressed. Right posterolateral bladder diverticulum. Stomach/Bowel: Large hiatal hernia contains the majority of the stomach. Small bowel, appendix and colon are unremarkable. Lymphatic: No pathologically enlarged lymph nodes. Reproductive: Prostatectomy. Other: Small bilateral inguinal hernias contain fat. No free fluid. Mesenteries and peritoneum are unremarkable. Musculoskeletal: Degenerative changes in the spine. Review of the MIP images confirms the above findings. IMPRESSION: 1. No evidence of acute aortic syndrome. 2. Small left pleural effusion with adjacent atelectasis. 3. Large hiatal hernia. 4. Aortic atherosclerosis (ICD10-I70.0). Coronary artery calcification. 5. Enlarged pulmonic trunk, indicative of pulmonary arterial hypertension. 6.  Emphysema (ICD10-J43.9). Electronically Signed   By: Newell Eke M.D.   On: 06/20/2024 18:12   DG Chest Portable 1 View Result Date: 06/20/2024 CLINICAL DATA:  Chest pain. EXAM: PORTABLE CHEST 1 VIEW COMPARISON:  CT chest dated 03/08/2024. Chest radiograph dated 01/23/2023. FINDINGS:  The heart size and mediastinal contours are unchanged. Aortic atherosclerosis. Similar large hiatal hernia. Status post bilateral lower lobe lobectomies. Similar left basilar atelectasis/scarring. No focal consolidation, sizeable pleural effusion, or pneumothorax. No acute osseous abnormality. IMPRESSION: 1. No acute cardiopulmonary findings. 2. Similar left  basilar atelectasis/scarring. 3. Large hiatal hernia. 4. Aortic Atherosclerosis (ICD10-I70.0). Electronically Signed   By: Harrietta Sherry M.D.   On: 06/20/2024 14:56    {Document cardiac monitor, telemetry assessment procedure when appropriate:32947} Procedures   Medications Ordered in the ED  pantoprazole  (PROTONIX ) injection 40 mg (40 mg Intravenous Given 06/20/24 1527)  HYDROmorphone  (DILAUDID ) injection 1 mg (1 mg Intravenous Given 06/20/24 1527)  ondansetron  (ZOFRAN ) injection 4 mg (4 mg Intravenous Given 06/20/24 1527)  iohexol  (OMNIPAQUE ) 350 MG/ML injection 100 mL (100 mLs Intravenous Contrast Given 06/20/24 1744)      {Click here for ABCD2, HEART and other calculators REFRESH Note before signing:1}                              Medical Decision Making Amount and/or Complexity of Data Reviewed Labs: ordered. Radiology: ordered.  Risk Prescription drug management.   Patient with large hiatal hernia.  She is started on Carafate  to add to the Protonix  she is taken and is given some hydrocodone  and she will follow-up with her PCP or GI  {Document critical care time when appropriate  Document review of labs and clinical decision tools ie [id number], etc  Document your independent review of radiology images and any outside records  Document your discussion with family members, caretakers and with consultants  Document social determinants of health affecting pt's care  Document your decision making why or why not admission, treatments were needed:32947:::1}   Final diagnoses:  Hiatal hernia    ED Discharge Orders          Ordered    sucralfate  (CARAFATE ) 1 GM/10ML suspension  3 times daily with meals & bedtime        06/20/24 1832    HYDROcodone -acetaminophen  (NORCO/VICODIN) 5-325 MG tablet        06/20/24 1832

## 2024-06-20 NOTE — Telephone Encounter (Signed)
 LMOVM to call back to schedule TCS/EGD with Dr. Shaaron, ASA 3 BUT OK RM 1, hold eliquis  x 2 days, no iron x 7 days prior

## 2024-06-20 NOTE — ED Triage Notes (Signed)
 Pt reports 10/10 Left chest pain radiating to both shoulders that began about 10:30 this morning.  Took tylenol  and hydrocodone  without relief.  No n/v/d or shob.  Pt is on eliquis  for afib.

## 2024-06-21 MED ORDER — PEG 3350-KCL-NA BICARB-NACL 420 G PO SOLR: 4000.0000 mL | ORAL | 0 refills | Status: DC

## 2024-06-21 NOTE — Telephone Encounter (Signed)
 Pt called back. Scheduled for 9/22. Aware will mail instructions and will send rx for prep to pharmacy. Aware of medications to be held.

## 2024-06-21 NOTE — Addendum Note (Signed)
 Addended by: JEANELL GRAEME RAMAN on: 06/21/2024 09:54 AM   Modules accepted: Orders

## 2024-06-25 DIAGNOSIS — K449 Diaphragmatic hernia without obstruction or gangrene: Secondary | ICD-10-CM | POA: Diagnosis not present

## 2024-07-04 DIAGNOSIS — N3020 Other chronic cystitis without hematuria: Secondary | ICD-10-CM | POA: Diagnosis not present

## 2024-07-18 DIAGNOSIS — E785 Hyperlipidemia, unspecified: Secondary | ICD-10-CM | POA: Diagnosis not present

## 2024-07-18 DIAGNOSIS — E1165 Type 2 diabetes mellitus with hyperglycemia: Secondary | ICD-10-CM | POA: Diagnosis not present

## 2024-07-18 DIAGNOSIS — E1122 Type 2 diabetes mellitus with diabetic chronic kidney disease: Secondary | ICD-10-CM | POA: Diagnosis not present

## 2024-07-18 DIAGNOSIS — R5383 Other fatigue: Secondary | ICD-10-CM | POA: Diagnosis not present

## 2024-07-18 DIAGNOSIS — N183 Chronic kidney disease, stage 3 unspecified: Secondary | ICD-10-CM | POA: Diagnosis not present

## 2024-07-18 DIAGNOSIS — I10 Essential (primary) hypertension: Secondary | ICD-10-CM | POA: Diagnosis not present

## 2024-07-19 ENCOUNTER — Other Ambulatory Visit: Payer: Self-pay | Admitting: Cardiology

## 2024-07-23 ENCOUNTER — Ambulatory Visit (HOSPITAL_COMMUNITY): Admitting: Anesthesiology

## 2024-07-23 ENCOUNTER — Ambulatory Visit (HOSPITAL_COMMUNITY)
Admission: RE | Admit: 2024-07-23 | Discharge: 2024-07-23 | Disposition: A | Discharge status: Home / Self Care | Attending: Internal Medicine | Admitting: Internal Medicine

## 2024-07-23 ENCOUNTER — Other Ambulatory Visit: Payer: Self-pay

## 2024-07-23 ENCOUNTER — Ambulatory Visit (HOSPITAL_BASED_OUTPATIENT_CLINIC_OR_DEPARTMENT_OTHER): Admitting: Anesthesiology

## 2024-07-23 ENCOUNTER — Encounter (HOSPITAL_COMMUNITY): Payer: Self-pay | Admitting: Internal Medicine

## 2024-07-23 ENCOUNTER — Encounter (HOSPITAL_COMMUNITY)
Admission: RE | Disposition: A | Discharge status: Home / Self Care | Payer: Self-pay | Source: Home / Self Care | Attending: Internal Medicine

## 2024-07-23 DIAGNOSIS — R948 Abnormal results of function studies of other organs and systems: Secondary | ICD-10-CM | POA: Insufficient documentation

## 2024-07-23 DIAGNOSIS — E119 Type 2 diabetes mellitus without complications: Secondary | ICD-10-CM | POA: Diagnosis not present

## 2024-07-23 DIAGNOSIS — K635 Polyp of colon: Secondary | ICD-10-CM | POA: Diagnosis not present

## 2024-07-23 DIAGNOSIS — K219 Gastro-esophageal reflux disease without esophagitis: Secondary | ICD-10-CM | POA: Diagnosis not present

## 2024-07-23 DIAGNOSIS — Z860101 Personal history of adenomatous and serrated colon polyps: Secondary | ICD-10-CM | POA: Diagnosis not present

## 2024-07-23 DIAGNOSIS — R1314 Dysphagia, pharyngoesophageal phase: Secondary | ICD-10-CM | POA: Diagnosis not present

## 2024-07-23 DIAGNOSIS — E7800 Pure hypercholesterolemia, unspecified: Secondary | ICD-10-CM | POA: Diagnosis not present

## 2024-07-23 DIAGNOSIS — K2289 Other specified disease of esophagus: Secondary | ICD-10-CM

## 2024-07-23 DIAGNOSIS — F17210 Nicotine dependence, cigarettes, uncomplicated: Secondary | ICD-10-CM | POA: Diagnosis not present

## 2024-07-23 DIAGNOSIS — R1310 Dysphagia, unspecified: Secondary | ICD-10-CM | POA: Diagnosis not present

## 2024-07-23 DIAGNOSIS — D124 Benign neoplasm of descending colon: Secondary | ICD-10-CM | POA: Diagnosis not present

## 2024-07-23 DIAGNOSIS — I10 Essential (primary) hypertension: Secondary | ICD-10-CM | POA: Insufficient documentation

## 2024-07-23 DIAGNOSIS — K5730 Diverticulosis of large intestine without perforation or abscess without bleeding: Secondary | ICD-10-CM | POA: Diagnosis not present

## 2024-07-23 DIAGNOSIS — K2950 Unspecified chronic gastritis without bleeding: Secondary | ICD-10-CM | POA: Insufficient documentation

## 2024-07-23 DIAGNOSIS — Z7901 Long term (current) use of anticoagulants: Secondary | ICD-10-CM | POA: Diagnosis not present

## 2024-07-23 DIAGNOSIS — B3781 Candidal esophagitis: Secondary | ICD-10-CM | POA: Diagnosis not present

## 2024-07-23 DIAGNOSIS — Z7984 Long term (current) use of oral hypoglycemic drugs: Secondary | ICD-10-CM | POA: Insufficient documentation

## 2024-07-23 DIAGNOSIS — K529 Noninfective gastroenteritis and colitis, unspecified: Secondary | ICD-10-CM | POA: Diagnosis not present

## 2024-07-23 DIAGNOSIS — K449 Diaphragmatic hernia without obstruction or gangrene: Secondary | ICD-10-CM

## 2024-07-23 HISTORY — PX: ESOPHAGOGASTRODUODENOSCOPY: SHX5428

## 2024-07-23 HISTORY — PX: COLONOSCOPY: SHX5424

## 2024-07-23 HISTORY — PX: ESOPHAGEAL DILATION: SHX303

## 2024-07-23 LAB — KOH PREP

## 2024-07-23 LAB — GLUCOSE, CAPILLARY: Glucose-Capillary: 154 mg/dL — ABNORMAL HIGH (ref 70–99)

## 2024-07-23 SURGERY — COLONOSCOPY
Anesthesia: General

## 2024-07-23 MED ORDER — PROPOFOL 10 MG/ML IV BOLUS
INTRAVENOUS | Status: DC | PRN
Start: 2024-07-23 — End: 2024-07-23
  Administered 2024-07-23: 30 mg via INTRAVENOUS
  Administered 2024-07-23: 50 mg via INTRAVENOUS
  Administered 2024-07-23: 20 mg via INTRAVENOUS
  Administered 2024-07-23: 125 ug/kg/min via INTRAVENOUS

## 2024-07-23 MED ORDER — PHENYLEPHRINE 80 MCG/ML (10ML) SYRINGE FOR IV PUSH (FOR BLOOD PRESSURE SUPPORT)
PREFILLED_SYRINGE | INTRAVENOUS | Status: DC | PRN
  Administered 2024-07-23: 240 ug via INTRAVENOUS
  Administered 2024-07-23: 80 ug via INTRAVENOUS
  Administered 2024-07-23: 160 ug via INTRAVENOUS
  Administered 2024-07-23: 240 ug via INTRAVENOUS
  Administered 2024-07-23: 160 ug via INTRAVENOUS

## 2024-07-23 MED ORDER — EPHEDRINE SULFATE-NACL 50-0.9 MG/10ML-% IV SOSY
PREFILLED_SYRINGE | INTRAVENOUS | Status: DC | PRN
  Administered 2024-07-23 (×2): 5 mg via INTRAVENOUS

## 2024-07-23 MED ORDER — LACTATED RINGERS IV SOLN: INTRAVENOUS | Status: DC

## 2024-07-23 MED ORDER — LIDOCAINE 2% (20 MG/ML) 5 ML SYRINGE
INTRAMUSCULAR | Status: DC | PRN
  Administered 2024-07-23: 60 mg via INTRAVENOUS
  Administered 2024-07-23: 40 mg via INTRAVENOUS

## 2024-07-23 NOTE — Op Note (Signed)
 [facility] Patient Name: Monica [name] Procedure Date: 07/23/2024 12:48 PM MRN: [id number] Date of Birth: Sep 16, 1941 Attending MD: [name] , MD, [id number] CSN: [id number] Age: 83 Admit Type: Outpatient Procedure:                Colonoscopy Indications:              Abnormal CT of the GI tract suggestive of                            left-sided diverticulitis?"symptoms resolved Providers:                [name], MD, Jon FELIX Gerome RN, RN,                            Bascom Blush Referring MD:              Medicines:                Propofol  per Anesthesia Complications:            No immediate complications. Estimated Blood Loss:     Estimated blood loss was minimal. Procedure:                Pre-Anesthesia Assessment:                           - Prior to the procedure, a History and Physical                            was performed, and patient medications and                            allergies were reviewed. The patient's tolerance of                            previous anesthesia was also reviewed. The risks                            and benefits of the procedure and the sedation                            options and risks were discussed with the patient.                            All questions were answered, and informed consent                            was obtained. Prior Anticoagulants: The patient                            last took Eliquis  (apixaban ) 3 days prior to the                            procedure. ASA Grade Assessment: III - A patient  with severe systemic disease. After reviewing the                            risks and benefits, the patient was deemed in                            satisfactory condition to undergo the procedure.                           After obtaining informed consent, the colonoscope                            was passed under direct vision. Throughout the                             procedure, the patient's blood pressure, pulse, and                            oxygen  saturations were monitored continuously. The                            CF-HQ190L ([id number]) Colon was introduced through                            the anus and advanced to the the cecum, identified                            by appendiceal orifice and ileocecal valve. The                            PCF-HQ190L ([id number]) Peds Colon was introduced                            through the and advanced to the. The colonoscopy                            was performed without difficulty. The patient                            tolerated the procedure well. The quality of the                            bowel preparation was adequate. The entire colon                            was well visualized. Scope In: 1:22:05 PM Scope Out: 1:42:40 PM Scope Withdrawal Time: 0 hours 9 minutes 58 seconds  Total Procedure Duration: 0 hours 20 minutes 35 seconds  Findings:      The perianal and digital rectal examinations were normal.      Many large-mouthed diverticula were found in the entire colon.      A 5 mm polyp was found in the descending colon. The polyp was sessile.       The polyp was removed with a cold  snare. Resection and retrieval were       complete. Estimated blood loss was minimal.      The exam was otherwise without abnormality on direct and retroflexion       views. Impression:               - Diverticulosis in the entire examined colon.                           - One 5 mm polyp in the descending colon, removed                            with a cold snare. Resected and retrieved.                           - The examination was otherwise normal on direct                            and retroflexion views. Moderate Sedation:      Moderate (conscious) sedation was personally administered by an       anesthesia professional. The following parameters were monitored: oxygen        saturation, heart rate,  blood pressure, respiratory rate, EKG, adequacy       of pulmonary ventilation, and response to care. Recommendation:           - Patient has a contact number available for                            emergencies. The signs and symptoms of potential                            delayed complications were discussed with the                            patient. Return to normal activities tomorrow.                            Written discharge instructions were provided to the                            patient.                           - Advance diet as tolerated.                           - Continue present medications.                           - Repeat colonoscopy after studies are complete for                            surveillance based on pathology results. Resume                            Eliquis  tomorrow.                           -  Return to GI office in 6 months. See EGD report Procedure Code(s):        --- Professional ---                           [phone], Colonoscopy, flexible; with removal of                            tumor(s), polyp(s), or other lesion(s) by snare                            technique Diagnosis Code(s):        --- Professional ---                           D12.4, Benign neoplasm of descending colon                           K57.30, Diverticulosis of large intestine without                            perforation or abscess without bleeding                           R93.3, Abnormal findings on diagnostic imaging of                            other parts of digestive tract CPT copyright 2022 American Medical Association. All rights reserved. The codes documented in this report are preliminary and upon coder review may  be revised to meet current compliance requirements. Lamar HERO. Vonda Harth, MD [name], MD 07/23/2024 1:57:37 PM This report has been signed electronically. Number of Addenda: 0

## 2024-07-23 NOTE — H&P (Signed)
 @LOGO @   Gastroenterology Progress Note    Primary Care Physician:  Atilano Deward ORN, MD Primary Gastroenterologist:  Dr. Shaaron  Pre-Procedure History & Physical: HPI:  Monica [name] is a 83 y.o. female here for  further evaluation of recurrent esophageal dysphagia.  Also here for colonoscopy due to left colon appearing abnormal on CT felt to be diverticulitis.  Symptoms resolved with antibiotics.  She is here to reassess her left colon via colonoscopy.  Also, distant history of multiple lymph, without recurrence of her multiple endoscopies over time.    Adenoma removed 2020.  Last Eliquis  9/19.  Past Medical History:  Diagnosis Date   Cancer (HCC)    NHL, Malt Lymphoma   Depression    Diverticulitis 04/28/2020   DM (diabetes mellitus) (HCC)    TYPE  2   Dysphagia    Dysrhythmia    History of palpatations   GERD (gastroesophageal reflux disease)    INGESTION    Heart palpitations    Hiatal hernia    Hyperlipidemia    Interstitial cystitis    Iron deficiency 04/28/2020   Lung cancer, lower lobe (HCC) 07/21/2018   Left lower lobe, stage Ia squamous cell carcinoma   MALT (mucosa associated lymphoid tissue)    gastric   Sinusitis     Past Surgical History:  Procedure Laterality Date   ABDOMINAL HYSTERECTOMY     BIOPSY  09/01/2016   Procedure: BIOPSY;  Surgeon: Lamar CHRISTELLA Shaaron, MD;  [facility];  Service: Endoscopy;;  gastric   BIOPSY  11/29/2018   Procedure: BIOPSY;  Surgeon: Shaaron Lamar CHRISTELLA, MD;  [facility];  Service: Endoscopy;;  right colon   BLADDER SURGERY     CATARACT EXTRACTION W/ INTRAOCULAR LENS  IMPLANT, BILATERAL  2015   CATARACT EXTRACTION, BILATERAL     COLONOSCOPY     Dr. Barry 2009: Normal per PCP notes   COLONOSCOPY N/A 11/29/2018   Cherri Yera: Diverticulosis, 3 polyps removed.  No evidence of colitis.  Tubular adenomas.  No future surveillance colonoscopies recommended due to age.   ESOPHAGOGASTRODUODENOSCOPY     RMR: Prominant  Schzgzki ring/component of peptic stricture status post dilation and disruption as described above, otherwise norma esophagus, moderate-sized hiatal hernia, antal pyloric channel, and posterier bulbar erosions, otherwise unremarkable stomach, D1 and D2 . Inflammatory findings on the stomach and duodenum will likely be related to aspirin  effect. We need to rule out Helicobacter pylor   ESOPHAGOGASTRODUODENOSCOPY N/A 03/10/2015   Dr. 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 Pathology with lymphoid population of stomach, slight atypia   ESOPHAGOGASTRODUODENOSCOPY N/A 02/03/2016   Dr. Shaaron: Schatzki ring noted at GE junction, dilated with 56 and then 58 French Maloney dilator. Large hiatal hernia. 6 x 7 cm nodular geographically ulcerated mucosa, biopsy c/w MALToma   ESOPHAGOGASTRODUODENOSCOPY N/A 04/08/2016   Dr. Shaaron: moderate Schatzki's ring/web s/p dilation, large hiatal hernia, localized area of gastric lymphoma visualized and appeared to be much improved. normal second portion of the duodenum. No specimens collected. Esophageal lumen notably tighted up significantly since her dilation in April of this year.    ESOPHAGOGASTRODUODENOSCOPY N/A 08/04/2016   Procedure: ESOPHAGOGASTRODUODENOSCOPY (EGD);  Surgeon: Lamar CHRISTELLA Shaaron, MD;  [facility];  Service: Endoscopy;  Laterality: N/A;  7:30 am   ESOPHAGOGASTRODUODENOSCOPY N/A 09/01/2016   Procedure: ESOPHAGOGASTRODUODENOSCOPY (EGD);  Surgeon: Lamar CHRISTELLA Shaaron, MD;  [facility];  Service: Endoscopy;  Laterality: N/A;  830    ESOPHAGOGASTRODUODENOSCOPY N/A 05/10/2017   Dr. Shaaron: Moderate Schatzki ring at the GE junction, status post dilation with 54 [place].  Medium sized hiatal hernia.  Few localized erosions in the gastric antrum.  Stomach biopsy showed chronic gastritis, no H. pylori.  No atypical lymphoid infiltrates or other features of lymphoproliferative process    ESOPHAGOGASTRODUODENOSCOPY N/A 06/13/2018   Dr. Shaaron: Schatzki ring status post dilation.  Large hiatal hernia.  Focal area 4 x 4 cm along the greater curvature, somewhat erythematous and thickened mucosa, biopsy benign.  Next EGD in August 2020.   ESOPHAGOGASTRODUODENOSCOPY N/A 01/14/2021   Procedure: ESOPHAGOGASTRODUODENOSCOPY (EGD);  Surgeon: Shaaron Lamar HERO, MD;  [facility];  Service: Endoscopy;  Laterality: N/A;  AM   ESOPHAGOGASTRODUODENOSCOPY (EGD) WITH PROPOFOL  N/A 06/16/2023   Procedure: ESOPHAGOGASTRODUODENOSCOPY (EGD) WITH PROPOFOL ;  Surgeon: Shaaron Lamar HERO, MD;  [facility];  Service: Endoscopy;  Laterality: N/A;  1:30 pm, asa 3   EYE SURGERY     INTERCOSTAL NERVE BLOCK Right 09/10/2019   Procedure: Intercostal Nerve Block;  Surgeon: Kerrin Elspeth BROCKS, MD;  Location: [facility] OR;  Service: Thoracic;  Laterality: Right;   LOBECTOMY Left 05/19/2018   Procedure: LEFT LOWER LOBECTOMY;  Surgeon: Kerrin Elspeth BROCKS, MD;  Location: [facility] OR;  Service: Thoracic;  Laterality: Left;   LYMPH NODE DISSECTION Right 09/10/2019   Procedure: Lymph Node Dissection;  Surgeon: Kerrin Elspeth BROCKS, MD;  Location: [facility] OR;  Service: Thoracic;  Laterality: Right;   MALONEY DILATION N/A 03/10/2015   Procedure: AGAPITO HODGKIN;  Surgeon: Lamar HERO Shaaron, MD;  [facility];  Service: Endoscopy;  Laterality: N/A;   MALONEY DILATION N/A 02/03/2016   Procedure: AGAPITO DILATION;  Surgeon: Lamar HERO Shaaron, MD;  [facility];  Service: Endoscopy;  Laterality: N/A;   MALONEY DILATION N/A 04/08/2016   Procedure: AGAPITO DILATION;  Surgeon: Lamar HERO Shaaron, MD;  [facility];  Service: Endoscopy;  Laterality: N/A;   MALONEY DILATION N/A 05/10/2017   Procedure: AGAPITO DILATION;  Surgeon: Shaaron Lamar HERO, MD;  [facility];  Service: Endoscopy;  Laterality: N/A;   MALONEY DILATION N/A 06/13/2018   Procedure: AGAPITO DILATION;  Surgeon: Shaaron Lamar HERO, MD;   [facility];  Service: Endoscopy;  Laterality: N/A;   MALONEY DILATION N/A 01/14/2021   Procedure: AGAPITO DILATION;  Surgeon: Shaaron Lamar HERO, MD;  [facility];  Service: Endoscopy;  Laterality: N/A;   MALONEY DILATION N/A 06/16/2023   Procedure: AGAPITO DILATION;  Surgeon: Shaaron Lamar HERO, MD;  [facility];  Service: Endoscopy;  Laterality: N/A;   POLYPECTOMY  11/29/2018   Procedure: POLYPECTOMY;  Surgeon: Shaaron Lamar HERO, MD;  [facility];  Service: Endoscopy;;   VIDEO ASSISTED THORACOSCOPY (VATS)/ LOBECTOMY Right 09/10/2019   Procedure: VIDEO ASSISTED THORACOSCOPY (VATS)/RIGHT LOWER LOBECTOMY;  Surgeon: Kerrin Elspeth BROCKS, MD;  Location: [facility] OR;  Service: Thoracic;  Laterality: Right;   VIDEO ASSISTED THORACOSCOPY (VATS)/WEDGE RESECTION Left 05/19/2018   Procedure: VIDEO ASSISTED THORACOSCOPY (VATS)/WEDGE RESECTION;  Surgeon: Kerrin Elspeth BROCKS, MD;  Location: [facility] OR;  Service: Thoracic;  Laterality: Left;    Prior to Admission medications   Medication Sig Start Date End Date Taking? Authorizing Provider  acetaminophen  (TYLENOL ) 500 MG tablet Take 1,000 mg by mouth every 6 (six) hours as needed for mild pain (pain score 1-3).   Yes [provider]  albuterol  (VENTOLIN  HFA) 108 (90 Base) MCG/ACT inhaler  Inhale 2 puffs into the lungs every 6 (six) hours as needed for wheezing or shortness of breath. 08/13/23  Yes [provider]  apixaban  (ELIQUIS ) 5 MG TABS tablet TAKE 1 TABLET BY MOUTH TWICE  DAILY 01/24/24  Yes Branch, Dorn FALCON, MD  ezetimibe  (ZETIA ) 10 MG tablet Take 10 mg by mouth at bedtime. 03/03/23  Yes [provider]  HYDROcodone -acetaminophen  (NORCO/VICODIN) 5-325 MG tablet Take 1 every 6 hours for pain that is not helped by Tylenol  alone 06/20/24  Yes Zammit, Joseph, MD  losartan  (COZAAR ) 25 MG tablet Take 1 tablet (25 mg total) by mouth daily. 03/12/24  Yes Ricky Fines, MD  Metoprolol  Tartrate 37.5 MG TABS  TAKE 1 TABLET BY MOUTH TWICE  DAILY 12/20/23  Yes Branch, Dorn FALCON, MD  ondansetron  (ZOFRAN -ODT) 4 MG disintegrating tablet Take 1 tablet (4 mg total) by mouth 3 (three) times daily as needed for nausea or vomiting. 04/19/24  Yes Shirlean Therisa ORN, NP  pantoprazole  (PROTONIX ) 40 MG tablet Take 1 tablet (40 mg total) by mouth 2 (two) times daily. 06/19/24  Yes Rogerio Boutelle, Lamar HERO, MD  pioglitazone  (ACTOS ) 15 MG tablet Take 15 mg by mouth daily.   Yes [provider]  sucralfate  (CARAFATE ) 1 GM/10ML suspension Take 10 mLs (1 g total) by mouth 4 (four) times daily -  with meals and at bedtime. 06/20/24  Yes Zammit, Joseph, MD  cholecalciferol (VITAMIN D3) 25 MCG (1000 UNIT) tablet Take 1,000 Units by mouth daily.    [provider]  ferrous sulfate 325 (65 FE) MG tablet Take 325 mg by mouth daily with breakfast.    [provider]  Multiple Vitamin (MULTIVITAMIN WITH MINERALS) TABS tablet Take 1 tablet by mouth daily.    [provider]  nitroGLYCERIN  (NITROSTAT ) 0.4 MG SL tablet Place 1 tablet (0.4 mg total) under the tongue every 5 (five) minutes x 3 doses as needed for chest pain (If no relief after 3rd dose, GO TO ED). 02/08/23 06/20/24  Miriam Norris, NP  polyethylene glycol powder (GLYCOLAX /MIRALAX ) 17 GM/SCOOP powder Take 17 g by mouth 3 (three) times a week.    [provider]  polyethylene glycol-electrolytes (NULYTELY ) 420 g solution Take 4,000 mLs by mouth as directed. 06/21/24   Shaaron Lamar HERO, MD    Allergies as of 06/21/2024 - Review Complete 06/20/2024  Allergen Reaction Noted   Elemental sulfur  Hives 06/04/2016    Family History  Problem Relation Age of Onset   Heart attack Father    Dementia Father    Diabetes Sister    Diabetes Brother    Diabetes Sister    Colon cancer Neg Hx     Social History   Socioeconomic History   Marital status: Married    Spouse name: Not on file   Number of children: Not on file   Years of education: Not on  file   Highest education level: Not on file  Occupational History   Not on file  Tobacco Use   Smoking status: Some Days    Current packs/day: 0.00    Average packs/day: 0.5 packs/day for 57.0 years (28.5 ttl pk-yrs)    Types: Cigarettes    Start date: 08/01/1965    Last attempt to quit: 08/01/2022    Years since quitting: 1.9    Passive exposure: Never   Smokeless tobacco: Never  Vaping Use   Vaping status: Never Used  Substance and Sexual Activity   Alcohol  use: No    Alcohol /week: 0.0  standard drinks of alcohol    Drug use: No   Sexual activity: Not Currently  Other Topics Concern   Not on file  Social History Narrative   Not on file   Social Drivers of Health   Financial Resource Strain: Not on file  Food Insecurity: No Food Insecurity (03/10/2024)   Hunger Vital Sign    Worried About Running Out of Food in the Last Year: Never true    Ran Out of Food in the Last Year: Never true  Transportation Needs: No Transportation Needs (03/10/2024)   PRAPARE - Administrator, Civil Service (Medical): No    Lack of Transportation (Non-Medical): No  Physical Activity: Not on file  Stress: Not on file  Social Connections: Socially Integrated (03/10/2024)   Social Connection and Isolation Panel    Frequency of Communication with Friends and Family: Three times a week    Frequency of Social Gatherings with Friends and Family: Once a week    Attends Religious Services: More than 4 times per year    Active Member of [employer] or Organizations: Yes    Attends Engineer, structural: More than 4 times per year    Marital Status: Married  Catering manager Violence: Not At Risk (03/10/2024)   Humiliation, Afraid, Rape, and Kick questionnaire    Fear of Current or Ex-Partner: No    Emotionally Abused: No    Physically Abused: No    Sexually Abused: No    Review of Systems   See HPI, otherwise negative ROS  Physical Exam: BP 122/62   Pulse 90   Temp 97.7 F (36.5 C)  (Oral)   Resp 18   Ht 5' 6 (1.676 m)   Wt 77.1 kg   SpO2 98%   BMI 27.44 kg/m  General:   Alert,  Well-developed, well-nourished, pleasant and cooperative in NAD Neck:  Supple; no masses or thyromegaly. No significant cervical adenopathy. Lungs:  Clear throughout to auscultation.   No wheezes, crackles, or rhonchi. No acute distress. Heart:  Regular rate and rhythm; no murmurs, clicks, rubs,  or gallops. Abdomen: Non-distended, normal bowel sounds.  Soft and nontender without appreciable mass or hepatosplenomegaly.   Impression/Plan:     83 year old lady with recent illness consistent with diverticulitis abnormal left colon on CT.  She recovered clinically on antibiotics.    Distant history  of MALT lymphoma.  Recurrent esophageal dysphagia.  Large hiatal hernia.  1 colonic adenoma found and removed on colonoscopy 2020  I have offered the patient EGD with esophageal dilation as feasible/appropriate as well as a diagnostic colonoscopy today.  Last Eliquis  2 days ago.  The risks, benefits, limitations, imponderables and alternatives regarding both EGD and colonoscopy have been reviewed with the patient. Questions have been answered. All parties agreeable.      Notice: This dictation was prepared with Dragon dictation along with smaller phrase technology. Any transcriptional errors that result from this process are unintentional and may not be corrected upon review.

## 2024-07-23 NOTE — Discharge Instructions (Addendum)
 EGD Discharge instructions Please read the instructions outlined below and refer to this sheet in the next few weeks. These discharge instructions provide you with general information on caring for yourself after you leave the hospital. Your doctor may also give you specific instructions. While your treatment has been planned according to the most current medical practices available, unavoidable complications occasionally occur. If you have any problems or questions after discharge, please call your doctor. ACTIVITY You may resume your regular activity but move at a slower pace for the next 24 hours.  Take frequent rest periods for the next 24 hours.  Walking will help expel (get rid of) the air and reduce the bloated feeling in your abdomen.  No driving for 24 hours (because of the anesthesia (medicine) used during the test).  You may shower.  Do not sign any important legal documents or operate any machinery for 24 hours (because of the anesthesia used during the test).  NUTRITION Drink plenty of fluids.  You may resume your normal diet.  Begin with a light meal and progress to your normal diet.  Avoid alcoholic beverages for 24 hours or as instructed by your caregiver.  MEDICATIONS You may resume your normal medications unless your caregiver tells you otherwise.  WHAT YOU CAN EXPECT TODAY You may experience abdominal discomfort such as a feeling of fullness or "gas" pains.  FOLLOW-UP Your doctor will discuss the results of your test with you.  SEEK IMMEDIATE MEDICAL ATTENTION IF ANY OF THE FOLLOWING OCCUR: Excessive nausea (feeling sick to your stomach) and/or vomiting.  Severe abdominal pain and distention (swelling).  Trouble swallowing.  Temperature over 101 F (37.8 C).  Rectal bleeding or vomiting of blood.    Colonoscopy Discharge Instructions  Read the instructions outlined below and refer to this sheet in the next few weeks. These discharge instructions provide you with  general information on caring for yourself after you leave the hospital. Your doctor may also give you specific instructions. While your treatment has been planned according to the most current medical practices available, unavoidable complications occasionally occur. If you have any problems or questions after discharge, call Dr. Shaaron at [phone]. ACTIVITY You may resume your regular activity, but move at a slower pace for the next 24 hours.  Take frequent rest periods for the next 24 hours.  Walking will help get rid of the air and reduce the bloated feeling in your belly (abdomen).  No driving for 24 hours (because of the medicine (anesthesia) used during the test).   Do not sign any important legal documents or operate any machinery for 24 hours (because of the anesthesia used during the test).  NUTRITION Drink plenty of fluids.  You may resume your normal diet as instructed by your doctor.  Begin with a light meal and progress to your normal diet. Heavy or fried foods are harder to digest and may make you feel sick to your stomach (nauseated).  Avoid alcoholic beverages for 24 hours or as instructed.  MEDICATIONS You may resume your normal medications unless your doctor tells you otherwise.  WHAT YOU CAN EXPECT TODAY Some feelings of bloating in the abdomen.  Passage of more gas than usual.  Spotting of blood in your stool or on the toilet paper.  IF YOU HAD POLYPS REMOVED DURING THE COLONOSCOPY: No aspirin  products for 7 days or as instructed.  No alcohol  for 7 days or as instructed.  Eat a soft diet for the next 24 hours.  FINDING  OUT THE RESULTS OF YOUR TEST Not all test results are available during your visit. If your test results are not back during the visit, make an appointment with your caregiver to find out the results. Do not assume everything is normal if you have not heard from your caregiver or the medical facility. It is important for you to follow up on all of your test  results.  SEEK IMMEDIATE MEDICAL ATTENTION IF: You have more than a spotting of blood in your stool.  Your belly is swollen (abdominal distention).  You are nauseated or vomiting.  You have a temperature over 101.  You have abdominal pain or discomfort that is severe or gets worse throughout the day.      Your esophagus was stretched today.  Samples of your esophagus were taken to check for yeast infection   Please resume Eliquis  tomorrow    Continue taking Protonix  40 mg twice daily   you have extensive diverticulosis in your colon but no other abnormality   a future colonoscopy is not recommended unless new  symptoms develop    office visit with us  in 6 months.

## 2024-07-23 NOTE — Anesthesia Preprocedure Evaluation (Signed)
 Anesthesia Evaluation  Patient identified by MRN, date of birth, ID band Patient awake    Reviewed: Allergy & Precautions, H&P , NPO status , Patient's Chart, lab work & pertinent test results, reviewed documented beta blocker date and time   Airway Mallampati: II  TM Distance: >3 FB Neck ROM: full    Dental no notable dental hx.    Pulmonary neg pulmonary ROS, Current Smoker and Patient abstained from smoking.   Pulmonary exam normal breath sounds clear to auscultation       Cardiovascular Exercise Tolerance: Good hypertension, negative cardio ROS + dysrhythmias  Rhythm:regular Rate:Normal     Neuro/Psych  PSYCHIATRIC DISORDERS  Depression     Neuromuscular disease negative neurological ROS  negative psych ROS   GI/Hepatic negative GI ROS, Neg liver ROS, hiatal hernia,GERD  ,,  Endo/Other  negative endocrine ROSdiabetes    Renal/GU negative Renal ROS  negative genitourinary   Musculoskeletal   Abdominal   Peds  Hematology negative hematology ROS (+)   Anesthesia Other Findings   Reproductive/Obstetrics negative OB ROS                              Anesthesia Physical Anesthesia Plan  ASA: 3  Anesthesia Plan: General   Post-op Pain Management:    Induction:   PONV Risk Score and Plan: Propofol  infusion  Airway Management Planned:   Additional Equipment:   Intra-op Plan:   Post-operative Plan:   Informed Consent: I have reviewed the patients History and Physical, chart, labs and discussed the procedure including the risks, benefits and alternatives for the proposed anesthesia with the patient or authorized representative who has indicated his/her understanding and acceptance.     Dental Advisory Given  Plan Discussed with: CRNA  Anesthesia Plan Comments:         Anesthesia Quick Evaluation

## 2024-07-23 NOTE — Transfer of Care (Signed)
 Immediate Anesthesia Transfer of Care Note  Patient: Monica [name]  Procedure(s) Performed: COLONOSCOPY EGD (ESOPHAGOGASTRODUODENOSCOPY) DILATION, ESOPHAGUS  Patient Location: Endoscopy Unit  Anesthesia Type:General  Level of Consciousness: drowsy and patient cooperative  Airway & Oxygen  Therapy: Patient Spontanous Breathing  Post-op Assessment: Report given to RN and Post -op Vital signs reviewed and stable  Post vital signs: Reviewed and stable  Last Vitals:  Vitals Value Taken Time  BP 114/58 07/23/24 13:54  Temp 36.4 C 07/23/24 13:54  Pulse 95 07/23/24 13:54  Resp 20 07/23/24 13:54  SpO2 99 % 07/23/24 13:54    Last Pain:  Vitals:   07/23/24 1354  TempSrc: Oral  PainSc: 0-No pain         Complications: No notable events documented.

## 2024-07-23 NOTE — Op Note (Signed)
 [name] Procedure Date: 07/23/2024 12:49 PM MRN: [id number] Date of Birth: 1940-12-14 Attending MD: [name] , MD, [id number] CSN: [id number] Age: 83 Admit Type: Outpatient Procedure:                Upper GI endoscopy Indications:              Dysphagia; recent ED evaluation for chest pain                            negative Providers:                [name], MD, Jon LABOR. Gerome RN, RN,                            Bascom Blush Referring MD:              Medicines:                Propofol  per Anesthesia Complications:            No immediate complications. Estimated Blood Loss:     Estimated blood loss was minimal. Procedure:                Pre-Anesthesia Assessment:                           - Prior to the procedure, a History and Physical                            was performed, and patient medications and                            allergies were reviewed. The patient's tolerance of                            previous anesthesia was also reviewed. The risks                            and benefits of the procedure and the sedation                            options and risks were discussed with the patient.                            All questions were answered, and informed consent                            was obtained. Prior Anticoagulants: The patient                            last took Eliquis  (apixaban ) 2 days prior to the                            procedure. ASA Grade Assessment: III - A patient  with severe systemic disease. After reviewing the                            risks and benefits, the patient was deemed in                            satisfactory condition to undergo the procedure.                           After obtaining informed consent, the endoscope was                            passed under direct vision. Throughout the                            procedure, the  patient's blood pressure, pulse, and                            oxygen  saturations were monitored continuously. The                            [id number] ([id number]) Upper was introduced through                            the mouth, and advanced to the second part of                            duodenum. The upper GI endoscopy was accomplished                            without difficulty. The patient tolerated the                            procedure well. Scope In: 1:11:03 PM Scope Out: 1:16:56 PM Total Procedure Duration: 0 hours 5 minutes 53 seconds  Findings:      Scant scattered, raised white plaques in the tubular esophagus. Please       see photos. Tubular esophagus patent throughout its course      A large hiatal hernia was present.      The duodenal bulb and second portion of the duodenum were normal. Scope       was withdrawn and a 56 [place] dilator was passed without       resistance. A look back revealed no apparent complication related to       this maneuver. Finally, esophageal mucosa was brushed for KOH prep. Impression:               - Large hiatal hernia. Esophageal plaques status                            post KOH brushing after dilation                           - Normal duodenal bulb and second portion of the  duodenum. Moderate Sedation:      Moderate (conscious) sedation was personally administered by an       anesthesia professional. The following parameters were monitored: oxygen        saturation, heart rate, blood pressure, respiratory rate, EKG, adequacy       of pulmonary ventilation, and response to care. Recommendation:           - Patient has a contact number available for                            emergencies. The signs and symptoms of potential                            delayed complications were discussed with the                            patient. Return to normal activities tomorrow.                             Written discharge instructions were provided to the                            patient.                           - Advance diet as tolerated. Continue twice daily                            PPI. Follow-up will KOH brushing. See colonoscopy                            report Procedure Code(s):        --- Professional ---                           [id number], Esophagogastroduodenoscopy, flexible,                            transoral; diagnostic, including collection of                            specimen(s) by brushing or washing, when performed                            (separate procedure) Diagnosis Code(s):        --- Professional ---                           K44.9, Diaphragmatic hernia without obstruction or                            gangrene                           R13.10, Dysphagia, unspecified CPT copyright 2022 [employer]. All rights reserved. The codes documented in this report are preliminary and upon coder review may  be revised to meet  current compliance requirements. [name], MD [name], MD 07/23/2024 1:53:21 PM This report has been signed electronically. Number of Addenda: 0

## 2024-07-24 ENCOUNTER — Encounter (HOSPITAL_COMMUNITY): Payer: Self-pay | Admitting: Internal Medicine

## 2024-07-24 ENCOUNTER — Other Ambulatory Visit: Payer: Self-pay | Admitting: Cardiology

## 2024-07-24 LAB — SURGICAL PATHOLOGY

## 2024-07-24 NOTE — Telephone Encounter (Signed)
 Prescription refill request for Eliquis  received. Indication:afib Last office visit:6/25 Scr:0.82  8/25 Age: 83 Weight:77.1  kg  Prescription refilled

## 2024-07-25 DIAGNOSIS — E7849 Other hyperlipidemia: Secondary | ICD-10-CM | POA: Diagnosis not present

## 2024-07-25 DIAGNOSIS — E1122 Type 2 diabetes mellitus with diabetic chronic kidney disease: Secondary | ICD-10-CM | POA: Diagnosis not present

## 2024-07-25 DIAGNOSIS — Z23 Encounter for immunization: Secondary | ICD-10-CM | POA: Diagnosis not present

## 2024-07-25 DIAGNOSIS — E782 Mixed hyperlipidemia: Secondary | ICD-10-CM | POA: Diagnosis not present

## 2024-07-25 DIAGNOSIS — F17210 Nicotine dependence, cigarettes, uncomplicated: Secondary | ICD-10-CM | POA: Diagnosis not present

## 2024-07-25 DIAGNOSIS — I10 Essential (primary) hypertension: Secondary | ICD-10-CM | POA: Diagnosis not present

## 2024-07-25 DIAGNOSIS — I700 Atherosclerosis of aorta: Secondary | ICD-10-CM | POA: Diagnosis not present

## 2024-07-26 ENCOUNTER — Ambulatory Visit: Payer: Self-pay | Admitting: Internal Medicine

## 2024-07-26 NOTE — Anesthesia Postprocedure Evaluation (Signed)
 Anesthesia Post Note  Patient: Monica [name]  Procedure(s) Performed: COLONOSCOPY EGD (ESOPHAGOGASTRODUODENOSCOPY) DILATION, ESOPHAGUS  Patient location during evaluation: Phase II Anesthesia Type: General Level of consciousness: awake Pain management: pain level controlled Vital Signs Assessment: post-procedure vital signs reviewed and stable Respiratory status: spontaneous breathing and respiratory function stable Cardiovascular status: blood pressure returned to baseline and stable Postop Assessment: no headache and no apparent nausea or vomiting Anesthetic complications: no Comments: Late entry   No notable events documented.   Last Vitals:  Vitals:   07/23/24 1237 07/23/24 1354  BP: 122/62 (!) 114/58  Pulse: 90 95  Resp: 18 20  Temp: 36.5 C 36.4 C  SpO2: 98% 99%    Last Pain:  Vitals:   07/23/24 1354  TempSrc: Oral  PainSc: 0-No pain                 Yvonna JINNY Bosworth

## 2024-08-01 ENCOUNTER — Telehealth: Payer: Self-pay

## 2024-08-01 MED ORDER — FLUCONAZOLE 100 MG PO TABS: 100.0000 mg | ORAL_TABLET | Freq: Every day | ORAL | 0 refills | Status: DC

## 2024-08-01 NOTE — Telephone Encounter (Signed)
 Rourk, Lamar HERO, MD  Wellington Madelin HERO, CMA Patient needs Diflucan 200 mg orally today and then 100 mg orally daily x 21 days.  No refills.  Office visit with me in 3 to 4 months.  See letter.  Please let her know.  Rx was sent to pharmacy, pt was made aware.

## 2024-08-15 ENCOUNTER — Encounter: Payer: Self-pay | Admitting: Internal Medicine

## 2024-09-10 ENCOUNTER — Other Ambulatory Visit: Payer: Self-pay

## 2024-09-10 DIAGNOSIS — C3432 Malignant neoplasm of lower lobe, left bronchus or lung: Secondary | ICD-10-CM

## 2024-09-10 DIAGNOSIS — C3431 Malignant neoplasm of lower lobe, right bronchus or lung: Secondary | ICD-10-CM

## 2024-09-11 ENCOUNTER — Inpatient Hospital Stay: Attending: Oncology

## 2024-09-11 ENCOUNTER — Ambulatory Visit (HOSPITAL_COMMUNITY)
Admission: RE | Admit: 2024-09-11 | Discharge: 2024-09-11 | Disposition: A | Discharge status: Home / Self Care | Source: Ambulatory Visit | Attending: Hematology | Admitting: Hematology

## 2024-09-11 DIAGNOSIS — C3432 Malignant neoplasm of lower lobe, left bronchus or lung: Secondary | ICD-10-CM

## 2024-09-11 DIAGNOSIS — Z8572 Personal history of non-Hodgkin lymphomas: Secondary | ICD-10-CM | POA: Insufficient documentation

## 2024-09-11 DIAGNOSIS — C3431 Malignant neoplasm of lower lobe, right bronchus or lung: Secondary | ICD-10-CM | POA: Insufficient documentation

## 2024-09-11 DIAGNOSIS — Z85118 Personal history of other malignant neoplasm of bronchus and lung: Secondary | ICD-10-CM | POA: Diagnosis present

## 2024-09-11 LAB — CBC WITH DIFFERENTIAL/PLATELET
Abs Immature Granulocytes: 0.02 K/uL (ref 0.00–0.07)
Basophils Absolute: 0.1 K/uL (ref 0.0–0.1)
Basophils Relative: 1 %
Eosinophils Absolute: 0 K/uL (ref 0.0–0.5)
Eosinophils Relative: 0 %
HCT: 44.5 % (ref 36.0–46.0)
Hemoglobin: 14.6 g/dL (ref 12.0–15.0)
Immature Granulocytes: 0 %
Lymphocytes Relative: 36 %
Lymphs Abs: 2.5 K/uL (ref 0.7–4.0)
MCH: 29.5 pg (ref 26.0–34.0)
MCHC: 32.8 g/dL (ref 30.0–36.0)
MCV: 89.9 fL (ref 80.0–100.0)
Monocytes Absolute: 0.4 K/uL (ref 0.1–1.0)
Monocytes Relative: 6 %
Neutro Abs: 3.9 K/uL (ref 1.7–7.7)
Neutrophils Relative %: 57 %
Platelets: 238 K/uL (ref 150–400)
RBC: 4.95 MIL/uL (ref 3.87–5.11)
RDW: 15.6 % — ABNORMAL HIGH (ref 11.5–15.5)
WBC: 6.9 K/uL (ref 4.0–10.5)
nRBC: 0 % (ref 0.0–0.2)

## 2024-09-11 LAB — COMPREHENSIVE METABOLIC PANEL WITH GFR
ALT: 19 U/L (ref 0–44)
AST: 27 U/L (ref 15–41)
Albumin: 4 g/dL (ref 3.5–5.0)
Alkaline Phosphatase: 97 U/L (ref 38–126)
Anion gap: 13 (ref 5–15)
BUN: 12 mg/dL (ref 8–23)
CO2: 24 mmol/L (ref 22–32)
Calcium: 9.2 mg/dL (ref 8.9–10.3)
Chloride: 99 mmol/L (ref 98–111)
Creatinine, Ser: 0.87 mg/dL (ref 0.44–1.00)
GFR, Estimated: >60 mL/min (ref >60)
Glucose, Bld: 251 mg/dL — ABNORMAL HIGH (ref 70–99)
Potassium: 3.8 mmol/L (ref 3.5–5.1)
Sodium: 136 mmol/L (ref 135–145)
Total Bilirubin: 0.6 mg/dL (ref 0.0–1.2)
Total Protein: 7.2 g/dL (ref 6.5–8.1)

## 2024-09-11 MED ORDER — IOHEXOL 300 MG/ML  SOLN
80.0000 mL | Freq: Once | INTRAMUSCULAR | Status: AC | PRN
  Administered 2024-09-11: 80 mL via INTRAVENOUS

## 2024-09-12 ENCOUNTER — Other Ambulatory Visit

## 2024-09-19 ENCOUNTER — Inpatient Hospital Stay: Admitting: Oncology

## 2024-09-19 VITALS — BP 134/73 | HR 83 | Temp 97.9°F | Resp 18 | Ht 66.0 in | Wt 170.0 lb

## 2024-09-19 DIAGNOSIS — C3432 Malignant neoplasm of lower lobe, left bronchus or lung: Secondary | ICD-10-CM

## 2024-09-19 DIAGNOSIS — C3431 Malignant neoplasm of lower lobe, right bronchus or lung: Secondary | ICD-10-CM

## 2024-09-19 DIAGNOSIS — Z85118 Personal history of other malignant neoplasm of bronchus and lung: Secondary | ICD-10-CM | POA: Diagnosis not present

## 2024-09-19 NOTE — Progress Notes (Signed)
 [facility] [address] [place] [address]    Clinic Day:  09/19/24   Referring physician: Atilano Deward ORN, MD  Patient Care Team: Atilano Deward ORN, MD as PCP - General (Family Medicine) Alvan Dorn FALCON, MD as PCP - Cardiology (Cardiology) Shaaron Lamar HERO, MD as Consulting Physician (Gastroenterology) Miriam Norris, NP as Nurse Practitioner (Cardiology)   ASSESSMENT & PLAN:   Assessment:  1.  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 -Adjuvant therapy was not recommended.   2.  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   3.  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  H. pylori negative. -XRT 30 Gray in 15 fractions from 03/18/2016 through 04/18/2016. -EGD biopsy on 06/13/2018 shows mild chronic gastritis and small lymphoid aggregates with no features of lymphoma. -PET scan on 08/13/2019 did not show any abnormal uptake associated with abdominal organs.  No lymphadenopathy. -Colonoscopy on 11/29/2018 showed diverticulosis in the sigmoid colon with 2 subcentimeter polyps in sigmoid colon.  Plan:  1.  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  No chest pains reported.  - Reviewed CT CAP from 03/08/2024: Stable postsurgical changes with no evidence of recurrence.  Other benign findings discussed. - Reviewed CT chest from 09/11/2024 which showed stable chest CT status post lower lobectomy's.  No evidence of local recurrence or metastatic disease.  Stable large hiatal hernia and aortic  arthrosclerosis. -Recommend follow-up in 1 year with labs and CT scan.  Patient can have CT chest without contrast.   2.  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   3.  Gastric marginal zone lymphoma: - She denies any fevers, night sweats or weight loss.  No palpable adenopathy.   Orders Placed This Encounter  Procedures   CT CHEST WO CONTRAST    Standing Status:   Future    Expected Date:   09/19/2025    Expiration Date:   12/18/2025    Preferred imaging location?:   [facility]   CBC with Differential/Platelet    Standing Status:   Future    Expected Date:   09/19/2025    Expiration Date:   12/18/2025    Release to patient:   Immediate   Comprehensive metabolic panel with GFR    Standing Status:   Future    Expected Date:   09/19/2025    Expiration Date:   12/18/2025    Release to patient:   Immediate     Delon FORBES Hope, NP   11/19/20251:09 PM  CHIEF COMPLAINT:   Diagnosis: Malignant neoplasm of lower lobe of right lung    Cancer Staging  Lung cancer [facility]) Staging form: Lung, AJCC 8th Edition - Clinical: cT1b, cN0 - Signed by Mora Lance, MD on 06/06/2018  MALToma [facility]) Staging form: Lymphoid Neoplasms, AJCC 6th Edition - Clinical stage from 03/23/2016: Stage I - Signed by Berry Debby RAMAN, PA-C on 03/23/2016    Prior Therapy: 1. Left lower lobectomy on 05/19/2018. 2. VATS with right lower lobectomy on 09/10/2019.  Current  Therapy:  surveillance   HISTORY OF PRESENT ILLNESS:   Oncology History  MALToma (HCC)  03/21/2015 Miscellaneous   H Pylori IgG NEGATIVE   02/03/2016 Procedure   EGD Dr. Shaaron, abnormal gastric mucosa. Pathology with EXTRANODAL Marginal zone lymphoma. NEGATIVE for H. Pylori   03/03/2016 Miscellaneous   H pylori stool antigen NEGATIVE   03/03/2016 PET scan   Focal area of hypermetabolism and wall thickening involving the body antral junction region of the stomach c/w  history of lymphoma. 5.5 mm LLL pulm nodule not hypermetabolic. Non contrast chest CT in 6 month   03/18/2016 - 04/08/2016 Radiation Therapy   The gastric tumor received 30 Gy in 15 fractions of 2 Gy   Lung cancer (HCC)  05/19/2018 Initial Diagnosis   Lung cancer (HCC)   06/06/2018 Cancer Staging   Staging form: Lung, AJCC 8th Edition - Clinical: cT1b, cN0 - Signed by Mora Lance, MD on 06/06/2018      INTERVAL HISTORY:   Monica [name] is a 83 y.o. female presenting to clinic today for follow up of left and right lung cancer.   Since her last visit, she underwent CT chest on 09/11/2024 which showed stable chest CT status post bilateral lower lobectomies.  No evidence of local recurrence or metastatic disease.  Stable large hiatal hernia containing much of the stomach.  Stable central enlargement of the pulmonary arteries consistent with pulmonary arterial hypertension and aortic arthrosclerosis.  She denies any recent hospitalizations or surgeries.  She presents today with her husband.  Reports she does not do a lot of walking around and stays tired all the time.  She uses a motorized chair when she goes anywhere.  Walking 100 feet causes her to have to stop and rest and catch her breath.  Appetite is 100% energy levels are 25%.  Occasionally she will have dizziness.   PAST MEDICAL HISTORY:   Past Medical History: Past Medical History:  Diagnosis Date   Cancer (HCC)    NHL, Malt Lymphoma   Depression    Diverticulitis 04/28/2020   DM (diabetes mellitus) (HCC)    TYPE  2   Dysphagia    Dysrhythmia    History of palpatations   GERD (gastroesophageal reflux disease)    INGESTION    Heart palpitations    Hiatal hernia    Hyperlipidemia    Interstitial cystitis    Iron deficiency 04/28/2020   Lung cancer, lower lobe (HCC) 07/21/2018   Left lower lobe, stage Ia squamous cell carcinoma   MALT (mucosa associated lymphoid tissue)    gastric   Sinusitis     Surgical History: Past  Surgical History:  Procedure Laterality Date   ABDOMINAL HYSTERECTOMY     BIOPSY  09/01/2016   Procedure: BIOPSY;  Surgeon: Lamar CHRISTELLA Shaaron, MD;  [facility];  Service: Endoscopy;;  gastric   BIOPSY  11/29/2018   Procedure: BIOPSY;  Surgeon: Shaaron Lamar CHRISTELLA, MD;  [facility];  Service: Endoscopy;;  right colon   BLADDER SURGERY     CATARACT EXTRACTION W/ INTRAOCULAR LENS  IMPLANT, BILATERAL  2015   CATARACT EXTRACTION, BILATERAL     COLONOSCOPY     Dr. Barry 2009: Normal per PCP notes   COLONOSCOPY N/A 11/29/2018   Rourk: Diverticulosis, 3 polyps removed.  No evidence of colitis.  Tubular adenomas.  No future surveillance colonoscopies recommended due to age.   COLONOSCOPY N/A 07/23/2024   Procedure: COLONOSCOPY;  Surgeon: Shaaron Lamar CHRISTELLA, MD;  [facility];  Service: Endoscopy;  Laterality: N/A;  200pm, ok rm 1-2   ESOPHAGEAL DILATION N/A 07/23/2024   Procedure: DILATION, ESOPHAGUS;  Surgeon: Shaaron Lamar HERO, MD;  [facility];  Service: Endoscopy;  Laterality: N/A;   ESOPHAGOGASTRODUODENOSCOPY     RMR: Prominant Schzgzki ring/component of peptic stricture status post dilation and disruption as described above, otherwise norma esophagus, moderate-sized hiatal hernia, antal pyloric channel, and posterier bulbar erosions, otherwise unremarkable stomach, D1 and D2 . Inflammatory findings on the stomach and duodenum will likely be related to aspirin  effect. We need to rule out Helicobacter pylor   ESOPHAGOGASTRODUODENOSCOPY N/A 03/10/2015   Dr. 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 Pathology with lymphoid population of stomach, slight atypia   ESOPHAGOGASTRODUODENOSCOPY N/A 02/03/2016   Dr. Shaaron: Schatzki ring noted at GE junction, dilated with 56 and then 58 French Maloney dilator. Large hiatal hernia. 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 Shaaron: moderate Schatzki's ring/web s/p dilation, large hiatal hernia, localized area of gastric lymphoma visualized and appeared to be much improved. normal second portion of the duodenum. No specimens collected. Esophageal lumen notably tighted up significantly since her dilation in April of this year.    ESOPHAGOGASTRODUODENOSCOPY N/A 08/04/2016   Procedure: ESOPHAGOGASTRODUODENOSCOPY (EGD);  Surgeon: Lamar HERO Shaaron, MD;  [facility];  Service: Endoscopy;  Laterality: N/A;  7:30 am   ESOPHAGOGASTRODUODENOSCOPY N/A 09/01/2016   Procedure: ESOPHAGOGASTRODUODENOSCOPY (EGD);  Surgeon: Lamar HERO Shaaron, MD;  [facility];  Service: Endoscopy;  Laterality: N/A;  830    ESOPHAGOGASTRODUODENOSCOPY N/A 05/10/2017   Dr. Shaaron: Moderate Schatzki ring at the GE junction, status post dilation with 54 French.  Medium sized hiatal hernia.  Few localized erosions in the gastric antrum.  Stomach biopsy showed chronic gastritis, no H. pylori.  No atypical lymphoid infiltrates or other features of lymphoproliferative process   ESOPHAGOGASTRODUODENOSCOPY N/A 06/13/2018   Dr. Shaaron: Schatzki ring status post dilation.  Large hiatal hernia.  Focal area 4 x 4 cm along the greater curvature, somewhat erythematous and thickened mucosa, biopsy benign.  Next EGD in August 2020.   ESOPHAGOGASTRODUODENOSCOPY N/A 01/14/2021   Procedure: ESOPHAGOGASTRODUODENOSCOPY (EGD);  Surgeon: Shaaron Lamar HERO, MD;  [facility];  Service: Endoscopy;  Laterality: N/A;  AM   ESOPHAGOGASTRODUODENOSCOPY N/A 07/23/2024   Procedure: EGD (ESOPHAGOGASTRODUODENOSCOPY);  Surgeon: Shaaron Lamar HERO, MD;  [facility];  Service: Endoscopy;  Laterality: N/A;   ESOPHAGOGASTRODUODENOSCOPY (EGD) WITH PROPOFOL  N/A 06/16/2023   Procedure: ESOPHAGOGASTRODUODENOSCOPY (EGD) WITH PROPOFOL ;  Surgeon: Shaaron Lamar HERO, MD;  [facility];  Service: Endoscopy;  Laterality: N/A;   1:30 pm, asa 3   EYE SURGERY     INTERCOSTAL NERVE BLOCK Right 09/10/2019   Procedure: Intercostal Nerve Block;  Surgeon: Kerrin Elspeth BROCKS, MD;  Location: [facility] OR;  Service: Thoracic;  Laterality: Right;   LOBECTOMY Left 05/19/2018   Procedure: LEFT LOWER LOBECTOMY;  Surgeon: Kerrin Elspeth BROCKS, MD;  Location: [facility] OR;  Service: Thoracic;  Laterality: Left;   LYMPH NODE DISSECTION Right 09/10/2019   Procedure: Lymph Node Dissection;  Surgeon: Kerrin Elspeth BROCKS, MD;  Location: [facility] OR;  Service: Thoracic;  Laterality: Right;   MALONEY DILATION N/A 03/10/2015   Procedure: AGAPITO HODGKIN;  Surgeon: Lamar HERO Shaaron, MD;  [facility];  Service: Endoscopy;  Laterality: N/A;   MALONEY DILATION  N/A 02/03/2016   Procedure: AGAPITO DILATION;  Surgeon: Lamar CHRISTELLA Hollingshead, MD;  [facility];  Service: Endoscopy;  Laterality: N/A;   MALONEY DILATION N/A 04/08/2016   Procedure: AGAPITO DILATION;  Surgeon: Lamar CHRISTELLA Hollingshead, MD;  [facility];  Service: Endoscopy;  Laterality: N/A;   MALONEY DILATION N/A 05/10/2017   Procedure: AGAPITO DILATION;  Surgeon: Hollingshead Lamar CHRISTELLA, MD;  [facility];  Service: Endoscopy;  Laterality: N/A;   MALONEY DILATION N/A 06/13/2018   Procedure: AGAPITO DILATION;  Surgeon: Hollingshead Lamar CHRISTELLA, MD;  [facility];  Service: Endoscopy;  Laterality: N/A;   MALONEY DILATION N/A 01/14/2021   Procedure: AGAPITO DILATION;  Surgeon: Hollingshead Lamar CHRISTELLA, MD;  [facility];  Service: Endoscopy;  Laterality: N/A;   MALONEY DILATION N/A 06/16/2023   Procedure: AGAPITO DILATION;  Surgeon: Hollingshead Lamar CHRISTELLA, MD;  [facility];  Service: Endoscopy;  Laterality: N/A;   POLYPECTOMY  11/29/2018   Procedure: POLYPECTOMY;  Surgeon: Hollingshead Lamar CHRISTELLA, MD;  [facility];  Service: Endoscopy;;   VIDEO ASSISTED THORACOSCOPY (VATS)/ LOBECTOMY Right 09/10/2019   Procedure: VIDEO ASSISTED THORACOSCOPY (VATS)/RIGHT LOWER LOBECTOMY;  Surgeon:  Kerrin Elspeth BROCKS, MD;  Location: [facility] OR;  Service: Thoracic;  Laterality: Right;   VIDEO ASSISTED THORACOSCOPY (VATS)/WEDGE RESECTION Left 05/19/2018   Procedure: VIDEO ASSISTED THORACOSCOPY (VATS)/WEDGE RESECTION;  Surgeon: Kerrin Elspeth BROCKS, MD;  Location: [facility] OR;  Service: Thoracic;  Laterality: Left;    Social History: Social History   Socioeconomic History   Marital status: Married    Spouse name: Not on file   Number of children: Not on file   Years of education: Not on file   Highest education level: Not on file  Occupational History   Not on file  Tobacco Use   Smoking status: Some Days    Current packs/day: 0.00    Average packs/day: 0.5 packs/day for 57.0 years (28.5 ttl pk-yrs)    Types: Cigarettes    Start date: 08/01/1965    Last attempt to quit: 08/01/2022    Years since quitting: 2.1    Passive exposure: Never   Smokeless tobacco: Never  Vaping Use   Vaping status: Never Used  Substance and Sexual Activity   Alcohol  use: No    Alcohol /week: 0.0 standard drinks of alcohol    Drug use: No   Sexual activity: Not Currently  Other Topics Concern   Not on file  Social History Narrative   Not on file   Social Drivers of Health   Financial Resource Strain: Not on file  Food Insecurity: No Food Insecurity (03/10/2024)   Hunger Vital Sign    Worried About Running Out of Food in the Last Year: Never true    Ran Out of Food in the Last Year: Never true  Transportation Needs: No Transportation Needs (03/10/2024)   PRAPARE - Administrator, Civil Service (Medical): No    Lack of Transportation (Non-Medical): No  Physical Activity: Not on file  Stress: Not on file  Social Connections: Socially Integrated (03/10/2024)   Social Connection and Isolation Panel    Frequency of Communication with Friends and Family: Three times a week    Frequency of Social Gatherings with Friends and Family: Once a week    Attends Religious Services: More than 4 times per  year    Active Member of [employer] or Organizations: Yes    Attends Banker Meetings: More than  4 times per year    Marital Status: Married  Catering Manager Violence: Not At Risk (03/10/2024)   Humiliation, Afraid, Rape, and Kick questionnaire    Fear of Current or Ex-Partner: No    Emotionally Abused: No    Physically Abused: No    Sexually Abused: No    Family History: Family History  Problem Relation Age of Onset   Heart attack Father    Dementia Father    Diabetes Sister    Diabetes Brother    Diabetes Sister    Colon cancer Neg Hx     Current Medications:  Current Outpatient Medications:    cholecalciferol (VITAMIN D3) 25 MCG (1000 UNIT) tablet, Take 1,000 Units by mouth daily., Disp: , Rfl:    ELIQUIS  5 MG TABS tablet, TAKE 1 TABLET BY MOUTH TWICE  DAILY, Disp: 200 tablet, Rfl: 2   ezetimibe  (ZETIA ) 10 MG tablet, Take 10 mg by mouth at bedtime., Disp: , Rfl:    ferrous sulfate 325 (65 FE) MG tablet, Take 325 mg by mouth daily with breakfast., Disp: , Rfl:    losartan  (COZAAR ) 50 MG tablet, Take 50 mg by mouth daily., Disp: , Rfl:    Metoprolol  Tartrate 37.5 MG TABS, TAKE 1 TABLET BY MOUTH TWICE  DAILY, Disp: 200 tablet, Rfl: 1   Multiple Vitamin (MULTIVITAMIN WITH MINERALS) TABS tablet, Take 1 tablet by mouth daily., Disp: , Rfl:    pantoprazole  (PROTONIX ) 40 MG tablet, Take 1 tablet (40 mg total) by mouth 2 (two) times daily., Disp: 180 tablet, Rfl: 3   pioglitazone  (ACTOS ) 15 MG tablet, Take 15 mg by mouth daily., Disp: , Rfl:    nitroGLYCERIN  (NITROSTAT ) 0.4 MG SL tablet, Place 1 tablet (0.4 mg total) under the tongue every 5 (five) minutes x 3 doses as needed for chest pain (If no relief after 3rd dose, GO TO ED). (Patient not taking: Reported on 09/19/2024), Disp: 30 tablet, Rfl: 1   Allergies: Allergies  Allergen Reactions   Elemental Sulfur  Hives    REVIEW OF SYSTEMS:   Review of Systems  Constitutional:  Positive for fatigue.  Respiratory:   Positive for shortness of breath.   Neurological:  Positive for dizziness.     VITALS:   Blood pressure 134/73, pulse 83, temperature 97.9 F (36.6 C), temperature source Tympanic, resp. rate 18, height 5' 6 (1.676 m), weight 170 lb (77.1 kg), SpO2 99%.  Wt Readings from Last 3 Encounters:  09/19/24 170 lb (77.1 kg)  07/23/24 170 lb (77.1 kg)  06/20/24 173 lb (78.5 kg)    Body mass index is 27.44 kg/m.  Performance status (ECOG): 1 - Symptomatic but completely ambulatory  PHYSICAL EXAM:   Physical Exam Constitutional:      Appearance: Normal appearance.  HENT:     Head: Normocephalic and atraumatic.  Eyes:     Pupils: Pupils are equal, round, and reactive to light.  Cardiovascular:     Rate and Rhythm: Normal rate and regular rhythm.     Heart sounds: Normal heart sounds. No murmur heard. Pulmonary:     Effort: Pulmonary effort is normal.     Breath sounds: Normal breath sounds. No wheezing.  Abdominal:     General: Bowel sounds are normal. There is no distension.     Palpations: Abdomen is soft.     Tenderness: There is no abdominal tenderness.  Musculoskeletal:        General: Normal range of motion.     Cervical back: Normal  range of motion.  Skin:    General: Skin is warm and dry.     Findings: No rash.  Neurological:     Mental Status: She is alert and oriented to person, place, and time.  Psychiatric:        Judgment: Judgment normal.     LABS:      Latest Ref Rng & Units 09/11/2024    9:19 AM 06/20/2024    2:25 PM 03/12/2024    3:27 AM  CBC  WBC 4.0 - 10.5 K/uL 6.9  12.0  5.3   Hemoglobin 12.0 - 15.0 g/dL 85.3  85.3  88.0   Hematocrit 36.0 - 46.0 % 44.5  44.4  36.2   Platelets 150 - 400 K/uL 238  240  295       Latest Ref Rng & Units 09/11/2024    9:19 AM 06/20/2024    2:25 PM 03/12/2024    3:27 AM  CMP  Glucose 70 - 99 mg/dL 748  783  869   BUN 8 - 23 mg/dL 12  13  8    Creatinine 0.44 - 1.00 mg/dL 9.12  9.17  9.09   Sodium 135 - 145 mmol/L  136  135  139   Potassium 3.5 - 5.1 mmol/L 3.8  3.8  4.2   Chloride 98 - 111 mmol/L 99  98  102   CO2 22 - 32 mmol/L 24  23  26    Calcium 8.9 - 10.3 mg/dL 9.2  9.2  8.9   Total Protein 6.5 - 8.1 g/dL 7.2  7.8    Total Bilirubin 0.0 - 1.2 mg/dL 0.6  0.8    Alkaline Phos 38 - 126 U/L 97  79    AST 15 - 41 U/L 27  21    ALT 0 - 44 U/L 19  17       No results found for: CEA1, CEA / No results found for: CEA1, CEA No results found for: PSA1 No results found for: CAN199 No results found for: RJW874  Lab Results  Component Value Date   TOTALPROTELP 6.6 03/01/2016   TOTALPROTELP 6.4 03/01/2016   ALBUMINELP 3.7 03/01/2016   A1GS 0.1 03/01/2016   A2GS 0.8 03/01/2016   BETS 1.0 03/01/2016   GAMS 0.9 03/01/2016   MSPIKE Not Observed 03/01/2016   SPEI Comment 03/01/2016   Lab Results  Component Value Date   TIBC 342 03/08/2024   TIBC 373 11/29/2023   TIBC 376 05/23/2023   FERRITIN 111 03/08/2024   FERRITIN 50 11/29/2023   FERRITIN 42 05/23/2023   IRONPCTSAT 14 03/08/2024   IRONPCTSAT 24 11/29/2023   IRONPCTSAT 30 05/23/2023   Lab Results  Component Value Date   LDH 111 11/18/2022   LDH 119 06/17/2022   LDH 122 12/17/2021     STUDIES:   CT CHEST W CONTRAST Result Date: 09/13/2024 CLINICAL DATA:  Non-small cell lung cancer, monitor. * Tracking Code: BO * EXAM: CT CHEST WITH CONTRAST TECHNIQUE: Multidetector CT imaging of the chest was performed during intravenous contrast administration. RADIATION DOSE REDUCTION: This exam was performed according to the departmental dose-optimization program which includes automated exposure control, adjustment of the mA and/or kV according to patient size and/or use of iterative reconstruction technique. CONTRAST:  80mL OMNIPAQUE  IOHEXOL  300 MG/ML  SOLN COMPARISON:  CT 06/20/2024, 03/08/2024 and 11/29/2023. FINDINGS: Cardiovascular: Atherosclerosis of the aorta, great vessels and coronary arteries. There is stable central  enlargement of the pulmonary arteries. Stable mild cardiomegaly without  significant pericardial fluid. Mediastinum/Nodes: There are no enlarged mediastinal, hilar or axillary lymph nodes.Stable large hiatal hernia containing most of the stomach. Thyroid  gland and trachea appear unremarkable. Lungs/Pleura: No pleural effusion or pneumothorax. Postsurgical changes from previous bilateral lower lobectomies. There is severe centrilobular and paraseptal emphysema. Stable scattered mild pulmonary scarring. No confluent airspace disease or suspicious nodularity. Upper abdomen: No significant findings in the visualized upper abdomen. Musculoskeletal/Chest wall: There is no chest wall mass or suspicious osseous finding. Similar mild multilevel spondylosis. IMPRESSION: 1. Stable chest CT status post bilateral lower lobectomies. No evidence of local recurrence or metastatic disease. 2. Stable large hiatal hernia containing most of the stomach. 3. Stable central enlargement of the pulmonary arteries consistent with pulmonary arterial hypertension. 4. Aortic Atherosclerosis (ICD10-I70.0) and Emphysema (ICD10-J43.9). Electronically Signed   By: Elsie Perone M.D.   On: 09/13/2024 15:35

## 2024-10-04 ENCOUNTER — Other Ambulatory Visit: Payer: Self-pay | Admitting: Cardiology

## 2024-10-18 ENCOUNTER — Encounter: Payer: Self-pay | Admitting: Cardiology

## 2024-10-18 ENCOUNTER — Ambulatory Visit: Attending: Cardiology | Admitting: Cardiology

## 2024-10-18 VITALS — BP 118/76 | HR 81 | Ht 66.0 in | Wt 174.0 lb

## 2024-10-18 DIAGNOSIS — D6869 Other thrombophilia: Secondary | ICD-10-CM | POA: Diagnosis not present

## 2024-10-18 DIAGNOSIS — I272 Pulmonary hypertension, unspecified: Secondary | ICD-10-CM | POA: Diagnosis not present

## 2024-10-18 DIAGNOSIS — E782 Mixed hyperlipidemia: Secondary | ICD-10-CM | POA: Diagnosis not present

## 2024-10-18 DIAGNOSIS — I480 Paroxysmal atrial fibrillation: Secondary | ICD-10-CM

## 2024-10-18 DIAGNOSIS — I10 Essential (primary) hypertension: Secondary | ICD-10-CM | POA: Diagnosis not present

## 2024-10-18 NOTE — Patient Instructions (Signed)
 Medication Instructions:  Your physician recommends that you continue on your current medications as directed. Please refer to the Current Medication list given to you today.  *If you need a refill on your cardiac medications before your next appointment, please call your pharmacy*  Lab Work: None If you have labs (blood work) drawn today and your tests are completely normal, you will receive your results only by: MyChart Message (if you have MyChart) OR A paper copy in the mail If you have any lab test that is abnormal or we need to change your treatment, we will call you to review the results.  Testing/Procedures: None  Follow-Up: At [facility], you and your health needs are our priority.  As part of our continuing mission to provide you with exceptional heart care, our providers are all part of one team.  This team includes your primary Cardiologist (physician) and Advanced Practice Providers or APPs (Physician Assistants and Nurse Practitioners) who all work together to provide you with the care you need, when you need it.  Your next appointment:   6 month(s)  Provider:   You may see Dina Rich, MD or the following Advanced Practice Provider on your designated Care Team:   Sharlene Dory, NP    We recommend signing up for the patient portal called "MyChart".  Sign up information is provided on this After Visit Summary.  MyChart is used to connect with patients for Virtual Visits (Telemedicine).  Patients are able to view lab/test results, encounter notes, upcoming appointments, etc.  Non-urgent messages can be sent to your provider as well.   To learn more about what you can do with MyChart, go to [email or web address].   Other Instructions

## 2024-10-18 NOTE — Progress Notes (Signed)
 Clinical Summary Monica [name] is a 83 y.o.female seen today for follow up of the following medical problems.    1. PAF - has not tolerated amio in the past - no recent palpitations - compliant with meds, no bleeding on eliquis .      2. History of lung cancer - followed by oncology, listed as stage I squamous cell - lobectomy in 2019 left side and right side 2020 - has not had signs recurrence.      3. Hyperlipidemia - muscle aches, pcp stopped statin. Most recently was on pravastatin . Symptoms resolved off statin - she is on zetia  10mg  daily now -labs followed by pcp   4. HTN - pcp stopped norvasc  due to low bp's - home bp's 110/60s-70s     5. Enlarged pulmonary artery/pulmonic trunk - noted on 06/2021 CT scan, also with signs of emphysema - 2019 echo could not estiamted PASP, normal RV. She did have grade II dd, LVEF 65-70%   08/2019 PFTs: moderate obstruction, moderate diffusion defect 08/2021 echo LVEF 70-75%, grade II dd, normal RV, PASP 38, mild MR   - no recent SOB/DOE   7. Coronary atherosclerosis - noted on CT scan for lung cancer screening - no recent chest pains.   06/2024 ER visit with chest pain. Negative cardiac workup and benign CTA, thought to be related to hiatal hernia. Followed by GI     8. Diverticulitis - admission 03/2024 Past Medical History:  Diagnosis Date   Cancer (HCC)    NHL, Malt Lymphoma   Depression    Diverticulitis 04/28/2020   DM (diabetes mellitus) (HCC)    TYPE  2   Dysphagia    Dysrhythmia    History of palpatations   GERD (gastroesophageal reflux disease)    INGESTION    Heart palpitations    Hiatal hernia    Hyperlipidemia    Interstitial cystitis    Iron deficiency 04/28/2020   Lung cancer, lower lobe (HCC) 07/21/2018   Left lower lobe, stage Ia squamous cell carcinoma   MALT (mucosa associated lymphoid tissue)    gastric   Sinusitis      Allergies[1]   Current Outpatient Medications  Medication Sig  Dispense Refill   cholecalciferol (VITAMIN D3) 25 MCG (1000 UNIT) tablet Take 1,000 Units by mouth daily.     ELIQUIS  5 MG TABS tablet TAKE 1 TABLET BY MOUTH TWICE  DAILY 200 tablet 2   ezetimibe  (ZETIA ) 10 MG tablet Take 10 mg by mouth at bedtime.     ferrous sulfate 325 (65 FE) MG tablet Take 325 mg by mouth daily with breakfast.     losartan  (COZAAR ) 50 MG tablet Take 50 mg by mouth daily.     Metoprolol  Tartrate 37.5 MG TABS TAKE 1 TABLET BY MOUTH TWICE  DAILY 200 tablet 1   Multiple Vitamin (MULTIVITAMIN WITH MINERALS) TABS tablet Take 1 tablet by mouth daily.     nitroGLYCERIN  (NITROSTAT ) 0.4 MG SL tablet Place 1 tablet (0.4 mg total) under the tongue every 5 (five) minutes x 3 doses as needed for chest pain (If no relief after 3rd dose, GO TO ED). (Patient not taking: Reported on 09/19/2024) 30 tablet 1   pantoprazole  (PROTONIX ) 40 MG tablet Take 1 tablet (40 mg total) by mouth 2 (two) times daily. 180 tablet 3   pioglitazone  (ACTOS ) 15 MG tablet Take 15 mg by mouth daily.     No current facility-administered medications for this visit.  Past Surgical History:  Procedure Laterality Date   ABDOMINAL HYSTERECTOMY     BIOPSY  09/01/2016   Procedure: BIOPSY;  Surgeon: Lamar CHRISTELLA Hollingshead, MD;  Location: [facility];  Service: Endoscopy;;  gastric   BIOPSY  11/29/2018   Procedure: BIOPSY;  Surgeon: Hollingshead Lamar CHRISTELLA, MD;  Location: [facility];  Service: Endoscopy;;  right colon   BLADDER SURGERY     CATARACT EXTRACTION W/ INTRAOCULAR LENS  IMPLANT, BILATERAL  2015   CATARACT EXTRACTION, BILATERAL     COLONOSCOPY     Dr. Barry 2009: Normal per PCP notes   COLONOSCOPY N/A 11/29/2018   Rourk: Diverticulosis, 3 polyps removed.  No evidence of colitis.  Tubular adenomas.  No future surveillance colonoscopies recommended due to age.   COLONOSCOPY N/A 07/23/2024   Procedure: COLONOSCOPY;  Surgeon: Hollingshead Lamar CHRISTELLA, MD;  Location: [facility];  Service: Endoscopy;  Laterality: N/A;  200pm,  ok rm 1-2   ESOPHAGEAL DILATION N/A 07/23/2024   Procedure: DILATION, ESOPHAGUS;  Surgeon: Hollingshead Lamar CHRISTELLA, MD;  Location: [facility];  Service: Endoscopy;  Laterality: N/A;   ESOPHAGOGASTRODUODENOSCOPY     RMR: Prominant Schzgzki ring/component of peptic stricture status post dilation and disruption as described above, otherwise norma esophagus, moderate-sized hiatal hernia, antal pyloric channel, and posterier bulbar erosions, otherwise unremarkable stomach, D1 and D2 . Inflammatory findings on the stomach and duodenum will likely be related to aspirin  effect. We need to rule out Helicobacter pylor   ESOPHAGOGASTRODUODENOSCOPY N/A 03/10/2015   Dr. 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 Pathology with lymphoid population of stomach, slight atypia   ESOPHAGOGASTRODUODENOSCOPY N/A 02/03/2016   Dr. Hollingshead: Schatzki ring noted at GE junction, dilated with 56 and then 58 French Maloney dilator. Large hiatal hernia. 6 x 7 cm nodular geographically ulcerated mucosa, biopsy c/w MALToma   ESOPHAGOGASTRODUODENOSCOPY N/A 04/08/2016   Dr. Hollingshead: moderate Schatzki's ring/web s/p dilation, large hiatal hernia, localized area of gastric lymphoma visualized and appeared to be much improved. normal second portion of the duodenum. No specimens collected. Esophageal lumen notably tighted up significantly since her dilation in April of this year.    ESOPHAGOGASTRODUODENOSCOPY N/A 08/04/2016   Procedure: ESOPHAGOGASTRODUODENOSCOPY (EGD);  Surgeon: Lamar CHRISTELLA Hollingshead, MD;  Location: [facility];  Service: Endoscopy;  Laterality: N/A;  7:30 am   ESOPHAGOGASTRODUODENOSCOPY N/A 09/01/2016   Procedure: ESOPHAGOGASTRODUODENOSCOPY (EGD);  Surgeon: Lamar CHRISTELLA Hollingshead, MD;  [facility]: [facility];  Service: Endoscopy;  Laterality: N/A;  830    ESOPHAGOGASTRODUODENOSCOPY N/A 05/10/2017   Dr. Hollingshead: Moderate Schatzki ring at the GE junction, status post dilation with 54  French.  Medium sized hiatal hernia.  Few localized erosions in the gastric antrum.  Stomach biopsy showed chronic gastritis, no H. pylori.  No atypical lymphoid infiltrates or other features of lymphoproliferative process   ESOPHAGOGASTRODUODENOSCOPY N/A 06/13/2018   Dr. Hollingshead: Schatzki ring status post dilation.  Large hiatal hernia.  Focal area 4 x 4 cm along the greater curvature, somewhat erythematous and thickened mucosa, biopsy benign.  Next EGD in August 2020.   ESOPHAGOGASTRODUODENOSCOPY N/A 01/14/2021   Procedure: ESOPHAGOGASTRODUODENOSCOPY (EGD);  Surgeon: Hollingshead Lamar CHRISTELLA, MD;  Location: [facility];  Service: Endoscopy;  Laterality: N/A;  AM   ESOPHAGOGASTRODUODENOSCOPY N/A 07/23/2024   Procedure: EGD (ESOPHAGOGASTRODUODENOSCOPY);  Surgeon: Hollingshead Lamar CHRISTELLA, MD;  Location: [facility];  Service: Endoscopy;  Laterality: N/A;   ESOPHAGOGASTRODUODENOSCOPY (EGD) WITH PROPOFOL  N/A 06/16/2023  Procedure: ESOPHAGOGASTRODUODENOSCOPY (EGD) WITH PROPOFOL ;  Surgeon: Shaaron Lamar HERO, MD;  Location: [facility];  Service: Endoscopy;  Laterality: N/A;  1:30 pm, asa 3   EYE SURGERY     INTERCOSTAL NERVE BLOCK Right 09/10/2019   Procedure: Intercostal Nerve Block;  Surgeon: Kerrin Elspeth BROCKS, MD;  Location: [facility] OR;  Service: Thoracic;  Laterality: Right;   LOBECTOMY Left 05/19/2018   Procedure: LEFT LOWER LOBECTOMY;  Surgeon: Kerrin Elspeth BROCKS, MD;  Location: [facility] OR;  Service: Thoracic;  Laterality: Left;   LYMPH NODE DISSECTION Right 09/10/2019   Procedure: Lymph Node Dissection;  Surgeon: Kerrin Elspeth BROCKS, MD;  Location: [facility] OR;  Service: Thoracic;  Laterality: Right;   MALONEY DILATION N/A 03/10/2015   Procedure: AGAPITO HODGKIN;  Surgeon: Lamar HERO Shaaron, MD;  Location: [facility];  Service: Endoscopy;  Laterality: N/A;   MALONEY DILATION N/A 02/03/2016   Procedure: AGAPITO DILATION;  Surgeon: Lamar HERO Shaaron, MD;  [facility]: [facility];  Service: Endoscopy;  Laterality: N/A;   MALONEY  DILATION N/A 04/08/2016   Procedure: AGAPITO DILATION;  Surgeon: Lamar HERO Shaaron, MD;  [facility]: [facility];  Service: Endoscopy;  Laterality: N/A;   MALONEY DILATION N/A 05/10/2017   Procedure: AGAPITO DILATION;  Surgeon: Shaaron Lamar HERO, MD;  [facility];  Service: Endoscopy;  Laterality: N/A;   MALONEY DILATION N/A 06/13/2018   Procedure: AGAPITO DILATION;  Surgeon: Shaaron Lamar HERO, MD;  [facility]: [facility];  Service: Endoscopy;  Laterality: N/A;   MALONEY DILATION N/A 01/14/2021   Procedure: AGAPITO DILATION;  Surgeon: Shaaron Lamar HERO, MD;  [facility]: [facility];  Service: Endoscopy;  Laterality: N/A;   MALONEY DILATION N/A 06/16/2023   Procedure: AGAPITO DILATION;  Surgeon: Shaaron Lamar HERO, MD;  [facility]: [facility];  Service: Endoscopy;  Laterality: N/A;   POLYPECTOMY  11/29/2018   Procedure: POLYPECTOMY;  Surgeon: Shaaron Lamar HERO, MD;  [facility]: [facility];  Service: Endoscopy;;   VIDEO ASSISTED THORACOSCOPY (VATS)/ LOBECTOMY Right 09/10/2019   Procedure: VIDEO ASSISTED THORACOSCOPY (VATS)/RIGHT LOWER LOBECTOMY;  Surgeon: Kerrin Elspeth BROCKS, MD;  Location: [facility] OR;  Service: Thoracic;  Laterality: Right;   VIDEO ASSISTED THORACOSCOPY (VATS)/WEDGE RESECTION Left 05/19/2018   Procedure: VIDEO ASSISTED THORACOSCOPY (VATS)/WEDGE RESECTION;  Surgeon: Kerrin Elspeth BROCKS, MD;  Location: [facility] OR;  Service: Thoracic;  Laterality: Left;     Allergies[2]    Family History  Problem Relation Age of Onset   Heart attack Father    Dementia Father    Diabetes Sister    Diabetes Brother    Diabetes Sister    Colon cancer Neg Hx      Social History Monica [name] reports that she has been smoking cigarettes. She started smoking about 59 years ago. She has a 28.5 pack-year smoking history. She has never been exposed to tobacco smoke. She has never used smokeless tobacco. Monica [name] reports no history of alcohol  use.    Physical Examination Today's Vitals   10/18/24 1442  BP:  118/76  Pulse: 81  SpO2: 96%  Weight: 174 lb (78.9 kg)  Height: 5' 6 (1.676 m)   Body mass index is 28.08 kg/m.  Gen: resting comfortably, no acute distress HEENT: no scleral icterus, pupils equal round and reactive, no palptable cervical adenopathy,  CV: RRR, no mrg, no vjd Resp: Clear to auscultation bilaterally GI: abdomen is soft, non-tender, non-distended, normal bowel sounds, no hepatosplenomegaly MSK: extremities are warm, no edema.  Skin: warm, no rash  Neuro:  no focal deficits Psych: appropriate affect   Diagnostic Studies  ECHO 2019: - Left ventricle: The cavity size was normal. Wall thickness was   increased in a pattern of moderate LVH. Systolic function was   vigorous. The estimated ejection fraction was in the range of 65%   to 70%. Wall motion was normal; there were no regional wall   motion abnormalities. Features are consistent with a pseudonormal   left ventricular filling pattern, with concomitant abnormal   relaxation and increased filling pressure (grade 2 diastolic   dysfunction). - Aortic valve: Mildly calcified annulus. Trileaflet. - Mitral valve: There was mild regurgitation. - Left atrium: The atrium was moderately dilated. - Right atrium: Central venous pressure (est): 3 mm Hg. - Atrial septum: No defect or patent foramen ovale was identified. - Tricuspid valve: There was trivial regurgitation. - Pulmonary arteries: Systolic pressure could not be accurately   estimated. - Pericardium, extracardiac: There was no pericardial effusion   08/2021 echo 1. Left ventricular ejection fraction, by estimation, is 70 to 75%. The  left ventricle has hyperdynamic function. The left ventricle has no  regional wall motion abnormalities. There is moderate concentric left  ventricular hypertrophy. Left ventricular  diastolic parameters are consistent with Grade II diastolic dysfunction  (pseudonormalization).   2. Right ventricular systolic function is  normal. The right ventricular  size is normal. There is mildly elevated pulmonary artery systolic  pressure. The estimated right ventricular systolic pressure is 38.8 mmHg.   3. Left atrial size was mildly dilated.   4. The mitral valve is abnormal. Mild mitral valve regurgitation.   5. The aortic valve is tricuspid. Aortic valve regurgitation is not  visualized. Mild aortic valve sclerosis is present, with no evidence of  aortic valve stenosis.   6. The inferior vena cava is normal in size with greater than 50%  respiratory variability, suggesting right atrial pressure of 3 mmHg.   Comparison(s): Prior images unable to be directly viewed.    Assessment and Plan    1. PAF/acquired thrombophilia - no symptoms - continue current meds including eliquis  for stroke prevention   2. HTN -bp at goal, continue current meds   3. Hyperlipidemia - request labs from pcp   4. Pulmonary HTN - mild by echo with normal RV, some enlargement of PA on prior CT scans - likely secondary to COPD by PFTs, grade II diastolic dysfunction - no symptoms, continue to monitor  F/u 6 months in [place] Dorn PHEBE Alvan, M.D     [1]  Allergies Allergen Reactions   Elemental Sulfur  Hives  [2]  Allergies Allergen Reactions   Elemental Sulfur  Hives

## 2024-10-19 ENCOUNTER — Encounter: Payer: Self-pay | Admitting: Family Medicine

## 2024-10-29 ENCOUNTER — Encounter: Payer: Self-pay | Admitting: *Deleted

## 2024-12-06 ENCOUNTER — Other Ambulatory Visit: Payer: Self-pay | Admitting: Gastroenterology

## 2025-09-12 ENCOUNTER — Inpatient Hospital Stay

## 2025-09-12 ENCOUNTER — Ambulatory Visit (HOSPITAL_COMMUNITY)

## 2025-09-19 ENCOUNTER — Inpatient Hospital Stay: Admitting: Oncology
